# Patient Record
Sex: Male | Born: 1945 | Race: White | Hispanic: No | Marital: Married | Smoking: Never smoker
Health system: Southern US, Community
[De-identification: ages and names within clinical notes are randomized; demographics above are authoritative.]

## PROBLEM LIST (undated history)

## (undated) DIAGNOSIS — E7800 Pure hypercholesterolemia, unspecified: Secondary | ICD-10-CM

## (undated) DIAGNOSIS — F514 Sleep terrors [night terrors]: Secondary | ICD-10-CM

## (undated) DIAGNOSIS — N189 Chronic kidney disease, unspecified: Secondary | ICD-10-CM

## (undated) DIAGNOSIS — G9340 Encephalopathy, unspecified: Secondary | ICD-10-CM

## (undated) DIAGNOSIS — I2510 Atherosclerotic heart disease of native coronary artery without angina pectoris: Secondary | ICD-10-CM

## (undated) DIAGNOSIS — K219 Gastro-esophageal reflux disease without esophagitis: Secondary | ICD-10-CM

## (undated) DIAGNOSIS — I10 Essential (primary) hypertension: Secondary | ICD-10-CM

## (undated) DIAGNOSIS — R519 Headache, unspecified: Secondary | ICD-10-CM

## (undated) DIAGNOSIS — M1990 Unspecified osteoarthritis, unspecified site: Secondary | ICD-10-CM

## (undated) DIAGNOSIS — G629 Polyneuropathy, unspecified: Secondary | ICD-10-CM

## (undated) DIAGNOSIS — I700 Atherosclerosis of aorta: Secondary | ICD-10-CM

## (undated) DIAGNOSIS — F329 Major depressive disorder, single episode, unspecified: Secondary | ICD-10-CM

## (undated) DIAGNOSIS — G4730 Sleep apnea, unspecified: Secondary | ICD-10-CM

## (undated) DIAGNOSIS — D649 Anemia, unspecified: Secondary | ICD-10-CM

## (undated) DIAGNOSIS — E119 Type 2 diabetes mellitus without complications: Secondary | ICD-10-CM

## (undated) DIAGNOSIS — R4182 Altered mental status, unspecified: Secondary | ICD-10-CM

## (undated) DIAGNOSIS — F419 Anxiety disorder, unspecified: Secondary | ICD-10-CM

## (undated) DIAGNOSIS — F32A Depression, unspecified: Secondary | ICD-10-CM

## (undated) HISTORY — PX: KNEE ARTHROSCOPY: SHX127

## (undated) HISTORY — PX: ESOPHAGOGASTRODUODENOSCOPY: SHX1529

## (undated) HISTORY — PX: COLONOSCOPY: SHX174

## (undated) HISTORY — PX: JOINT REPLACEMENT: SHX530

## (undated) HISTORY — PX: TONSILLECTOMY: SUR1361

## (undated) HISTORY — PX: CIRCUMCISION: SUR203

## (undated) HISTORY — PX: APPENDECTOMY: SHX54

---

## 2005-01-25 ENCOUNTER — Ambulatory Visit: Payer: Self-pay | Admitting: Internal Medicine

## 2005-04-23 ENCOUNTER — Other Ambulatory Visit: Payer: Self-pay

## 2005-04-23 ENCOUNTER — Emergency Department: Payer: Self-pay | Admitting: Emergency Medicine

## 2005-06-16 ENCOUNTER — Ambulatory Visit: Payer: Self-pay | Admitting: Unknown Physician Specialty

## 2005-10-31 ENCOUNTER — Other Ambulatory Visit: Payer: Self-pay

## 2005-10-31 ENCOUNTER — Emergency Department: Payer: Self-pay | Admitting: Unknown Physician Specialty

## 2008-04-29 ENCOUNTER — Ambulatory Visit: Payer: Self-pay | Admitting: Gastroenterology

## 2008-12-31 ENCOUNTER — Ambulatory Visit: Payer: Self-pay | Admitting: Internal Medicine

## 2009-01-10 ENCOUNTER — Ambulatory Visit: Payer: Self-pay | Admitting: Internal Medicine

## 2009-09-01 ENCOUNTER — Emergency Department: Payer: Self-pay | Admitting: Emergency Medicine

## 2009-09-03 ENCOUNTER — Emergency Department: Payer: Self-pay | Admitting: Emergency Medicine

## 2011-07-31 ENCOUNTER — Emergency Department: Payer: Self-pay | Admitting: Unknown Physician Specialty

## 2012-01-14 ENCOUNTER — Emergency Department: Payer: Self-pay | Admitting: Internal Medicine

## 2012-01-14 LAB — DRUG SCREEN, URINE
Amphetamines, Ur Screen: NEGATIVE (ref ?–1000)
Barbiturates, Ur Screen: NEGATIVE (ref ?–200)
Benzodiazepine, Ur Scrn: NEGATIVE (ref ?–200)
Methadone, Ur Screen: NEGATIVE (ref ?–300)
Opiate, Ur Screen: NEGATIVE (ref ?–300)
Phencyclidine (PCP) Ur S: NEGATIVE (ref ?–25)
Tricyclic, Ur Screen: NEGATIVE (ref ?–1000)

## 2012-01-14 LAB — COMPREHENSIVE METABOLIC PANEL
Anion Gap: 8 (ref 7–16)
BUN: 29 mg/dL — ABNORMAL HIGH (ref 7–18)
Bilirubin,Total: 0.2 mg/dL (ref 0.2–1.0)
Chloride: 109 mmol/L — ABNORMAL HIGH (ref 98–107)
EGFR (Non-African Amer.): >60
Potassium: 3.9 mmol/L (ref 3.5–5.1)

## 2012-01-14 LAB — ETHANOL: Ethanol %: <0.003 % (ref 0.000–0.080)

## 2012-01-14 LAB — CBC
HCT: 40.4 % (ref 40.0–52.0)
HGB: 13.5 g/dL (ref 13.0–18.0)
Platelet: 211 10*3/uL (ref 150–440)
RBC: 4.35 10*6/uL — ABNORMAL LOW (ref 4.40–5.90)
RDW: 13.6 % (ref 11.5–14.5)

## 2012-01-14 LAB — LIPASE, BLOOD: Lipase: 74 U/L (ref 73–393)

## 2012-05-29 ENCOUNTER — Emergency Department (HOSPITAL_COMMUNITY): Payer: Medicare Other

## 2012-05-29 ENCOUNTER — Emergency Department (HOSPITAL_COMMUNITY)
Admission: EM | Admit: 2012-05-29 | Discharge: 2012-05-29 | Disposition: A | Discharge status: Home / Self Care | Payer: Medicare Other | Attending: Emergency Medicine | Admitting: Emergency Medicine

## 2012-05-29 DIAGNOSIS — F29 Unspecified psychosis not due to a substance or known physiological condition: Secondary | ICD-10-CM | POA: Insufficient documentation

## 2012-05-29 DIAGNOSIS — G319 Degenerative disease of nervous system, unspecified: Secondary | ICD-10-CM | POA: Insufficient documentation

## 2012-05-29 DIAGNOSIS — R4182 Altered mental status, unspecified: Secondary | ICD-10-CM | POA: Insufficient documentation

## 2012-05-29 DIAGNOSIS — G459 Transient cerebral ischemic attack, unspecified: Secondary | ICD-10-CM

## 2012-05-29 LAB — POCT I-STAT, CHEM 8
BUN: 29 mg/dL — ABNORMAL HIGH (ref 6–23)
Chloride: 110 mEq/L (ref 96–112)
HCT: 44 % (ref 39.0–52.0)
Potassium: 4.3 mEq/L (ref 3.5–5.1)
Sodium: 141 mEq/L (ref 135–145)

## 2012-05-29 LAB — COMPREHENSIVE METABOLIC PANEL
Albumin: 3.6 g/dL (ref 3.5–5.2)
Alkaline Phosphatase: 82 U/L (ref 39–117)
BUN: 28 mg/dL — ABNORMAL HIGH (ref 6–23)
Calcium: 9.1 mg/dL (ref 8.4–10.5)
Creatinine, Ser: 1.17 mg/dL (ref 0.50–1.35)
Potassium: 4.3 mEq/L (ref 3.5–5.1)
Total Protein: 6.8 g/dL (ref 6.0–8.3)

## 2012-05-29 LAB — DIFFERENTIAL
Basophils Relative: 0 % (ref 0–1)
Eosinophils Absolute: 0.2 10*3/uL (ref 0.0–0.7)
Eosinophils Relative: 2 % (ref 0–5)
Monocytes Relative: 10 % (ref 3–12)
Neutrophils Relative %: 67 % (ref 43–77)

## 2012-05-29 LAB — CBC
Hemoglobin: 14.5 g/dL (ref 13.0–17.0)
MCH: 31.2 pg (ref 26.0–34.0)
MCHC: 33.9 g/dL (ref 30.0–36.0)

## 2012-05-29 LAB — PROTIME-INR
INR: 0.99 (ref 0.00–1.49)
Prothrombin Time: 13 seconds (ref 11.6–15.2)

## 2012-05-29 LAB — POCT I-STAT TROPONIN I: Troponin i, poc: 0.03 ng/mL (ref 0.00–0.08)

## 2012-05-29 LAB — TROPONIN I: Troponin I: <0.3 ng/mL (ref <0.30)

## 2012-05-29 LAB — GLUCOSE, CAPILLARY: Glucose-Capillary: 140 mg/dL — ABNORMAL HIGH (ref 70–99)

## 2012-05-29 NOTE — ED Notes (Signed)
Pt ambulated with amanda, nt. Pt did well. Pt states that he was "alittle dizzy" but he felt that was because he had been lying in bed so long.

## 2012-05-29 NOTE — ED Provider Notes (Signed)
History     CSN: [id number]  Arrival date & time 05/29/12  1749   First MD Initiated Contact with Patient 05/29/12 1802      Chief Complaint  Patient presents with  . Code Stroke    (Consider location/radiation/quality/duration/timing/severity/associated sxs/prior treatment) HPI - Level 5 caveat due to urgent need for intervention Pt reports this morning he was at [facility] and didn't feel right. States he was having difficulty speaking. He went to work where a co-worker didn't think he looked well and called EMS while helping him to the floor. On EMS arrival he was responsive but confused. He had ?L sided hand weakness, brought to the ED as Code Stroke.   No past medical history on file.  No past surgical history on file.  No family history on file.  History  Substance Use Topics  . Smoking status: Not on file  . Smokeless tobacco: Not on file  . Alcohol Use: Not on file      Review of Systems Unable to assess due to mental status.   Allergies  Penicillins and Tavist  Home Medications  No current outpatient prescriptions on file.  BP 164/65  Pulse 78  Temp 98.4 F (36.9 C) (Oral)  Resp 25  SpO2 100%  Physical Exam  Nursing note and vitals reviewed. Constitutional: He is oriented to person, place, and time. He appears well-developed and well-nourished.  HENT:  Head: Normocephalic and atraumatic.  Eyes: EOM are normal. Pupils are equal, round, and reactive to light.  Neck: Normal range of motion. Neck supple.  Cardiovascular: Normal rate, normal heart sounds and intact distal pulses.   Pulmonary/Chest: Effort normal and breath sounds normal.  Abdominal: Bowel sounds are normal. He exhibits no distension. There is no tenderness.  Musculoskeletal: Normal range of motion. He exhibits no edema and no tenderness.  Neurological: He is alert and oriented to person, place, and time. He has normal strength. No cranial nerve deficit or sensory deficit.       For full  NIHSS, please see Code Stroke Team notes  Skin: Skin is warm and dry. No rash noted.  Psychiatric: He has a normal mood and affect.    ED Course  Procedures (including critical care time)  Labs Reviewed  COMPREHENSIVE METABOLIC PANEL - Abnormal; Notable for the following:    Glucose, Bld 143 (*)     BUN 28 (*)     Total Bilirubin 0.1 (*)     GFR calc non Af Amer 63 (*)     GFR calc Af Amer 73 (*)     All other components within normal limits  POCT I-STAT, CHEM 8 - Abnormal; Notable for the following:    BUN 29 (*)     Glucose, Bld 137 (*)     All other components within normal limits  GLUCOSE, CAPILLARY - Abnormal; Notable for the following:    Glucose-Capillary 140 (*)     All other components within normal limits  PROTIME-INR  APTT  CBC  DIFFERENTIAL  TROPONIN I  POCT I-STAT TROPONIN I   Ct Head Wo Contrast  05/29/2012  *RADIOLOGY REPORT*  Clinical Data:  Last seen normal 1730 hours.  Left-sided weakness and confusion.  CT HEAD WITHOUT CONTRAST  Technique:  Contiguous axial images were obtained from the base of the skull through the vertex without contrast  Comparison:  None.  Findings:  The brain has a normal appearance without evidence for hemorrhage, acute infarction, hydrocephalus, or mass lesion.  There is no extra axial fluid collection.  The skull and paranasal sinuses are normal.  There are no CT signs of proximal vascular thrombosis, but there is a tiny calcification seen in the region of the M1 segment right middle cerebral artery which could represent intracranial atherosclerotic change.  Correlate clinically.  IMPRESSION:  No acute or focal intracranial abnormality.  Probable small focus of calcification proximal right middle cerebral artery.  Critical Value/emergent results were called by telephone at the time of interpretation on 05/30/2012 at 6:15 hours to Dr. Thad Ranger, who verbally acknowledged these results.   Original Report Authenticated By: Elsie Stain, M.D.     Mr [facility] Wo Contrast  05/29/2012  *RADIOLOGY REPORT*  Clinical Data:  Generalized weakness. Code stroke.  MRI HEAD WITHOUT CONTRAST MRA HEAD WITHOUT CONTRAST  Technique:  Multiplanar, multiecho pulse sequences of the brain and surrounding structures were obtained without intravenous contrast. Angiographic images of the head were obtained using MRA technique without contrast.  Comparison:  CT head earlier in the day.  MRI HEAD  Findings:  There is no evidence for acute infarction, intracranial hemorrhage, mass lesion, hydrocephalus, or extra-axial fluid.  Mild age related atrophy.  No significant white matter disease.  No foci of chronic hemorrhage.  Normal pituitary and cerebellar tonsils. No acute sinus disease. Nonspecific left mastoid fluid.  IMPRESSION: Mild age related atrophy.  No acute intracranial findings. No change from prior unremarkable CT.  MRA HEAD  Findings: The internal carotid arteries are widely patent.  The basilar artery is widely patent with vertebrals codominant.  There is no proximal stenosis of the anterior, middle, or posterior cerebral arteries. There is no visible intracranial aneurysm.  The area questioned as a possible calcification CT in the proximal right MCA is not associated with any narrowing.  IMPRESSION: Negative exam.   Original Report Authenticated By: Elsie Stain, M.D.    Mr Brain Wo Contrast  05/29/2012  *RADIOLOGY REPORT*  Clinical Data:  Generalized weakness. Code stroke.  MRI HEAD WITHOUT CONTRAST MRA HEAD WITHOUT CONTRAST  Technique:  Multiplanar, multiecho pulse sequences of the brain and surrounding structures were obtained without intravenous contrast. Angiographic images of the head were obtained using MRA technique without contrast.  Comparison:  CT head earlier in the day.  MRI HEAD  Findings:  There is no evidence for acute infarction, intracranial hemorrhage, mass lesion, hydrocephalus, or extra-axial fluid.  Mild age related atrophy.  No significant  white matter disease.  No foci of chronic hemorrhage.  Normal pituitary and cerebellar tonsils. No acute sinus disease. Nonspecific left mastoid fluid.  IMPRESSION: Mild age related atrophy.  No acute intracranial findings. No change from prior unremarkable CT.  MRA HEAD  Findings: The internal carotid arteries are widely patent.  The basilar artery is widely patent with vertebrals codominant.  There is no proximal stenosis of the anterior, middle, or posterior cerebral arteries. There is no visible intracranial aneurysm.  The area questioned as a possible calcification CT in the proximal right MCA is not associated with any narrowing.  IMPRESSION: Negative exam.   Original Report Authenticated By: Elsie Stain, M.D.      1. Altered mental status       MDM   Date: 05/29/2012  Rate: 73  Rhythm: normal sinus rhythm  QRS Axis: normal  Intervals: normal  ST/T Wave abnormalities: normal  Conduction Disutrbances: none  Narrative Interpretation: unremarkable  Pt evaluated by Code Stroke team who did not feel he is a tPA  candidate. His symptoms are completely resolved now and he is back to baseline. His symptoms are not consistent with ischemic stroke. MRI/MRA ordered by Dr. Thad Ranger are neg. Per her recommendations no further ischemic workup is needed. Pt states he has an appointment with his PCP           Teena Mangus B. Bernette Mayers, MD 05/29/12 2318

## 2012-05-29 NOTE — Consult Note (Signed)
Referring Physician: Dr. Freida Busman    Chief Complaint: problems talking  HPI: Patrick Turner is an 67 y.o. male who states that he was in [place] today around 10am and began to "feel funny". He could not elaborate. 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 The patient says that he was unable to speak. He was concerned that he was confused, although EMS stated that he was oriented.   He has no history of sz activity. He has had panic attacks in the past with difficulty speaking he admits. He is here alone. Wife not yet present. He is hard of hearing. Glucose 137.  NIHSS=1 on presentation, resolved to NIHSS=0   LSN: 1000 per patient. tPA Given: No: symptoms resolved quickly. Out of window for tPA.  PMH: Hypertension, Depression, Anxiety  No family history on file.  Social History:  does not have a smoking history on file. He does not have any smokeless tobacco history on file. His alcohol and drug histories not on file.  Allergies: Allergies not on file  Medications-  ROS: History obtained from the patient.  General ROS: negative for - chills, fatigue, fever, night sweats, weight gain or weight loss Psychological ROS: negative for - behavioral disorder, hallucinations, memory difficulties, mood swings or suicidal ideation, +depression, anxiety Ophthalmic ROS: negative for - blurry vision, double vision, eye pain or loss of vision, +wears glasses ENT ROS: negative for - epistaxis, nasal discharge, oral lesions, sore throat, tinnitus or vertigo Allergy and Immunology ROS: negative for - hives or itchy/watery eyes Hematological and Lymphatic ROS: negative for - bleeding problems, bruising or swollen lymph nodes Endocrine ROS: negative for - galactorrhea, hair pattern changes, polydipsia/polyuria or temperature intolerance Respiratory ROS: negative for - cough, hemoptysis, shortness of breath or wheezing Cardiovascular ROS: negative for -  chest pain, dyspnea on exertion, edema or irregular heartbeat Gastrointestinal ROS: negative for - abdominal pain, diarrhea, hematemesis, nausea/vomiting or stool incontinence Genito-Urinary ROS: negative for - dysuria, hematuria, incontinence or urinary frequency/urgency Musculoskeletal ROS: negative for - joint swelling or muscular weakness Neurological ROS: as noted in HPI Dermatological ROS: negative for rash and skin lesion changes    Physical Examination: There were no vitals taken for this visit.  Neurologic Examination: Mental Status: Alert, oriented, thought content appropriate.  Difficulty word finding that improves with continued speech  Able to follow 3 step commands without difficulty. Cranial Nerves: II: visual fields grossly normal, pupils equal, round, reactive to light and accommodation III,IV, VI: ptosis not present, extra-ocular motions intact bilaterally V,VII: smile symmetric, facial light touch sensation normal bilaterally VIII: hearing normal bilaterally IX,X: gag reflex present XI: trapezius strength/neck flexion strength normal bilaterally XII: tongue strength normal  Motor: Right : Upper extremity   5/5    Left:     Upper extremity   5/5  Lower extremity   5/5     Lower extremity   5/5 Tone and bulk:normal tone throughout; no atrophy noted Sensory: Pinprick and light touch intact throughout, bilaterally Deep Tendon Reflexes: 2+ and symmetric throughout Plantars: Right: downgoing   Left: downgoing Cerebellar: normal heel-to-shin test, unable to access gait.   Results for orders placed during the hospital encounter of 05/29/12 (from the past 48 hour(s))  POCT I-STAT, CHEM 8     Status: Abnormal   Collection Time   05/29/12  6:13 PM      Component Value Range Comment   Sodium 141  135 -  145 mEq/L    Potassium 4.3  3.5 - 5.1 mEq/L    Chloride 110  96 - 112 mEq/L    BUN 29 (*) 6 - 23 mg/dL    Creatinine, Ser 1.61  0.50 - 1.35 mg/dL    Glucose, Bld 096 (*)  70 - 99 mg/dL    Calcium, Ion 0.45  4.09 - 1.30 mmol/L    TCO2 21  0 - 100 mmol/L    Hemoglobin 15.0  13.0 - 17.0 g/dL    HCT 81.1  91.4 - 78.2 %   GLUCOSE, CAPILLARY     Status: Abnormal   Collection Time   05/29/12  6:19 PM      Component Value Range Comment   Glucose-Capillary 140 (*) 70 - 99 mg/dL    Ct Head Wo Contrast 05/29/2012    No acute or focal intracranial abnormality.  Probable small focus of calcification proximal right middle cerebral artery.  Critical Value/emergent results were called by telephone at the time of interpretation on 05/30/2012 at 6:15 hours to Dr. Thad Ranger, who verbally acknowledged these results.   Original Report Authenticated By: Elsie Stain, M.D.     Assessment: 66 y.o. male with generalized weakness and "feeling bad" since 10am. Expressive aphasia on presentation which has now cleared. Further work up necessary to determine if this was even an ischemic event.  Patient reports just feeling poorly-as if about to pass out-unlikely stroke.    -Speech difficulty, resolved, NIHSS=1, now at NIHSS=0 -Hypertension -Depression -Anxiety -Obesity -Diabetes Mellitus  Stroke Risk Factors - diabetes mellitus and hypertension  Plan: 1. MRI, MRA  of the brain without contrast with remaining stroke work up only to be performed if imaging diagnostic for an acute ischemic event.   2. Prophylactic therapy-Antiplatelet med: Aspirin - dose 81mg  daily, rec: start if not already on aspirin.  3. Risk factor modification 4. Telemetry monitoring 5. Frequent neuro checks   Job Founds, MBA, [place] [facility] Pager [phone]  05/29/2012, 6:25 PM  Patient seen and examined.  Clinical course and management discussed.  Necessary edits performed.  I agree with the above.  Thana Farr, MD [facility] [phone]  05/29/2012  7:08 PM

## 2012-05-29 NOTE — ED Notes (Signed)
Per EMS- pt has not felt good since 10am. At approx 1725 pt was noted by coworker to "look like he felt good". Pt was assisted to floor by coworker. Pt states that he could not speak. Pt was responsive but sluggish. Pt was weak bilaterally upon EMS arrival. Upon arrival to ED pt was able to grip with some weakness in left hand. CBG 137. BP 138 systolic. 98% on room air. Hr 78 NSR.   Code stroke log-  Encoded 1738 Called 1739 Pt arrival 1750 EDP exam 1751 strok team arrival 1750 Last seen normal 1000 Pt arrival in CT 1755  Phlebotomist 1750 CT read 1803 Code stroke canceled at 1819 by Dr. Thad Ranger.

## 2012-05-29 NOTE — ED Notes (Signed)
CBG: 140. RN notified. 

## 2012-11-22 ENCOUNTER — Ambulatory Visit: Payer: Self-pay | Admitting: Unknown Physician Specialty

## 2012-11-23 ENCOUNTER — Observation Stay: Payer: Self-pay | Admitting: Internal Medicine

## 2012-11-23 LAB — URINALYSIS, COMPLETE
Bilirubin,UR: NEGATIVE
Blood: NEGATIVE
Ketone: NEGATIVE
Leukocyte Esterase: NEGATIVE
Nitrite: NEGATIVE
RBC,UR: <1 /HPF (ref 0–5)
WBC UR: <1 /HPF (ref 0–5)

## 2012-11-23 LAB — CBC WITH DIFFERENTIAL/PLATELET
Eosinophil #: 0.2 10*3/uL (ref 0.0–0.7)
Eosinophil %: 2 %
HCT: 40.9 % (ref 40.0–52.0)
Lymphocyte %: 12 %
Neutrophil #: 7.4 10*3/uL — ABNORMAL HIGH (ref 1.4–6.5)
Platelet: 227 10*3/uL (ref 150–440)
RDW: 13.8 % (ref 11.5–14.5)

## 2012-11-23 LAB — COMPREHENSIVE METABOLIC PANEL
Alkaline Phosphatase: 71 U/L (ref 50–136)
BUN: 26 mg/dL — ABNORMAL HIGH (ref 7–18)
Bilirubin,Total: 0.2 mg/dL (ref 0.2–1.0)
Co2: 22 mmol/L (ref 21–32)
Creatinine: 1.5 mg/dL — ABNORMAL HIGH (ref 0.60–1.30)
EGFR (African American): 55 — ABNORMAL LOW
EGFR (Non-African Amer.): 47 — ABNORMAL LOW
Glucose: 70 mg/dL (ref 65–99)
Osmolality: 283 (ref 275–301)
SGOT(AST): 38 U/L — ABNORMAL HIGH (ref 15–37)
Sodium: 140 mmol/L (ref 136–145)
Total Protein: 6.6 g/dL (ref 6.4–8.2)

## 2012-11-23 LAB — HEMOGLOBIN A1C: Hemoglobin A1C: 6.8 % — ABNORMAL HIGH (ref 4.2–6.3)

## 2012-11-24 LAB — BASIC METABOLIC PANEL
EGFR (African American): >60
Glucose: 180 mg/dL — ABNORMAL HIGH (ref 65–99)
Osmolality: 285 (ref 275–301)
Sodium: 139 mmol/L (ref 136–145)

## 2012-11-24 LAB — CBC WITH DIFFERENTIAL/PLATELET
HGB: 14 g/dL (ref 13.0–18.0)
Lymphocyte %: 25.3 %
MCHC: 33 g/dL (ref 32.0–36.0)
Monocyte #: 0.8 x10 3/mm (ref 0.2–1.0)
Monocyte %: 10.4 %

## 2014-01-21 DIAGNOSIS — E1122 Type 2 diabetes mellitus with diabetic chronic kidney disease: Secondary | ICD-10-CM

## 2014-01-21 DIAGNOSIS — G4733 Obstructive sleep apnea (adult) (pediatric): Secondary | ICD-10-CM | POA: Insufficient documentation

## 2014-01-21 DIAGNOSIS — I10 Essential (primary) hypertension: Secondary | ICD-10-CM | POA: Diagnosis present

## 2014-01-21 DIAGNOSIS — N183 Chronic kidney disease, stage 3 unspecified: Secondary | ICD-10-CM

## 2014-01-24 DIAGNOSIS — G2119 Other drug induced secondary parkinsonism: Secondary | ICD-10-CM | POA: Insufficient documentation

## 2014-02-07 ENCOUNTER — Ambulatory Visit: Payer: Self-pay | Admitting: Internal Medicine

## 2014-02-19 ENCOUNTER — Ambulatory Visit: Payer: Self-pay

## 2014-02-25 ENCOUNTER — Ambulatory Visit: Payer: Self-pay | Admitting: Internal Medicine

## 2014-03-08 DIAGNOSIS — K219 Gastro-esophageal reflux disease without esophagitis: Secondary | ICD-10-CM | POA: Diagnosis present

## 2014-03-08 DIAGNOSIS — E785 Hyperlipidemia, unspecified: Secondary | ICD-10-CM | POA: Insufficient documentation

## 2014-03-08 DIAGNOSIS — E1169 Type 2 diabetes mellitus with other specified complication: Secondary | ICD-10-CM | POA: Insufficient documentation

## 2014-08-02 DIAGNOSIS — F325 Major depressive disorder, single episode, in full remission: Secondary | ICD-10-CM | POA: Insufficient documentation

## 2014-09-16 DIAGNOSIS — D510 Vitamin B12 deficiency anemia due to intrinsic factor deficiency: Secondary | ICD-10-CM | POA: Diagnosis not present

## 2014-09-16 DIAGNOSIS — E1140 Type 2 diabetes mellitus with diabetic neuropathy, unspecified: Secondary | ICD-10-CM | POA: Diagnosis not present

## 2014-09-23 DIAGNOSIS — Z794 Long term (current) use of insulin: Secondary | ICD-10-CM | POA: Diagnosis not present

## 2014-09-23 DIAGNOSIS — E1140 Type 2 diabetes mellitus with diabetic neuropathy, unspecified: Secondary | ICD-10-CM | POA: Diagnosis not present

## 2014-09-23 DIAGNOSIS — G629 Polyneuropathy, unspecified: Secondary | ICD-10-CM | POA: Diagnosis not present

## 2014-12-10 DIAGNOSIS — E1122 Type 2 diabetes mellitus with diabetic chronic kidney disease: Secondary | ICD-10-CM | POA: Diagnosis not present

## 2014-12-10 DIAGNOSIS — F3341 Major depressive disorder, recurrent, in partial remission: Secondary | ICD-10-CM | POA: Diagnosis not present

## 2014-12-10 DIAGNOSIS — E1169 Type 2 diabetes mellitus with other specified complication: Secondary | ICD-10-CM | POA: Diagnosis not present

## 2014-12-10 DIAGNOSIS — I10 Essential (primary) hypertension: Secondary | ICD-10-CM | POA: Diagnosis not present

## 2014-12-17 DIAGNOSIS — F333 Major depressive disorder, recurrent, severe with psychotic symptoms: Secondary | ICD-10-CM | POA: Diagnosis not present

## 2014-12-24 DIAGNOSIS — D510 Vitamin B12 deficiency anemia due to intrinsic factor deficiency: Secondary | ICD-10-CM | POA: Diagnosis not present

## 2014-12-24 DIAGNOSIS — E1169 Type 2 diabetes mellitus with other specified complication: Secondary | ICD-10-CM | POA: Diagnosis not present

## 2014-12-24 DIAGNOSIS — E1149 Type 2 diabetes mellitus with other diabetic neurological complication: Secondary | ICD-10-CM | POA: Diagnosis not present

## 2014-12-24 DIAGNOSIS — E785 Hyperlipidemia, unspecified: Secondary | ICD-10-CM | POA: Diagnosis not present

## 2014-12-31 DIAGNOSIS — Z794 Long term (current) use of insulin: Secondary | ICD-10-CM | POA: Diagnosis not present

## 2014-12-31 DIAGNOSIS — E1149 Type 2 diabetes mellitus with other diabetic neurological complication: Secondary | ICD-10-CM | POA: Diagnosis not present

## 2015-01-02 NOTE — Consult Note (Signed)
Brief Consult Note: Diagnosis: Major depressive disorder.   Patient was seen by consultant.   Comments: Mr. Phung has been a patient of Dr. Lanetta Inch. He is in good hands. His paxil was recently switched to am in the context of nightmares. His terrors most likely stem from hypoglycemia.   PLAN: 1. Please continue medications as prescribed by Dr. Thurmond Butts.  2. Follow up with Dr. Thurmond Butts.  Electronic Signatures: Orson Slick (MD)  (Signed 14-Mar-14 18:20)  Authored: Brief Consult Note   Last Updated: 14-Mar-14 18:20 by Orson Slick (MD)

## 2015-01-02 NOTE — Consult Note (Signed)
PATIENT NAME:  Patrick Turner, CALLEROS MR#:  [id number] DATE OF BIRTH:  1945-11-23  DATE OF CONSULTATION:  11/23/2012  REFERRING PHYSICIAN:  Frazier Richards, M.D. CONSULTING PHYSICIAN:  A. Lavone Orn, MD  CHIEF COMPLAINT:  Type 2 diabetes with hypoglycemia.   HISTORY OF PRESENT ILLNESS:  This is a 69 year old male who was admitted earlier today with altered mental status and hypoglycemia.  The patient is well-known to me.  He has a history of type 2 diabetes managed with insulin and metformin.  Last night he was at a sleep lab clinic where he apparently developed altered mental status.  EMS was called.  His blood sugar was quite low, perhaps into the 20s.  He was given one amp of D50 and brought to the ED.  In the ED, blood sugar dropped into the 50s.  He was put on IV dextrose.  Mental status normalized.  Both metformin and his insulin have been held.  The patient also reports he had an episode earlier this week of loss of consciousness where EMS was called to his home.  Apparently that occurred around 4:45 p.m. where the episode early this morning occurred around 1:30 a.m.  During the episode earlier this week, he called his psychiatrist and apparently had some garbled speech and they called EMS to the home.  By his report, his glucose was as low as 17.  They treated him and did not take him to the ED for that episode.  The patient reports he checks his blood sugars typically twice daily and that sugars in the last two weeks fasting have ranged 50 to 110 and in the evenings have been in the 90 to 210 range.  He says he has been eating well.  He denies weight loss or weight gain.  He does occasionally skip lunch.  Yesterday, he did not have any missed meals.  He reports good compliance with his insulin regimen and denies missing any doses.   PAST MEDICAL HISTORY: 1.  Type 2 diabetes.  2.  Diabetic peripheral neuropathy.  3.  Hypertension.  4.  Gastroesophageal reflux disease.  5.  Migraine headaches.   6.  Depression.  7.  Hyperlipidemia.  8.  History of CAD status post PTCA.  9.  Obstructive sleep apnea.   PAST SURGICAL HISTORY: 1.  Appendectomy.  2.  Tonsillectomy.   SOCIAL HISTORY:  The patient is married.  No tobacco or alcohol use.  He is employed.   FAMILY HISTORY:  Positive for diabetes and hypertension.   OUTPATIENT REGIMEN:  1.  Humulin 70/30 mix, 40 units with breakfast and 45 units with supper.  2.  Metformin 500 mg 4 tabs daily.  3.  Gabapentin 800 mg 2 tabs daily.  4.  Lorazepam 0.5 mg 3 times daily as needed.  5.  Ziprasidone 20 mg at bedtime.  6.  Omeprazole 40 mg daily.  7.  Pravastatin 80 mg daily.  8.  Paxil 40 mg daily.  9.  Topiramate 100 mg twice daily.  10.  Lisinopril to HCTZ 20/12.5 2 tabs daily.  11.  Aspirin 325 mg daily.  12.  Ambien 10 mg at bedtime as needed.   ALLERGIES:  TAVIST AND PENICILLIN.   REVIEW OF SYSTEMS:  HEENT:  Denies blurred vision.  No sore throat.  NECK:  No neck pain.  No dysphasia.  CARDIAC:  No chest pain.  No palpitations.  PULMONARY:  No cough.  No shortness of breath.  ABDOMEN:  Good appetite.  No  recent weight loss.  No abdominal pain.  EXTREMITIES:  Denies leg swelling.  SKIN:  Denies rash or pruritus.  ENDOCRINE:  Denies heat or cold intolerance.  NEUROLOGIC:  No recent falls.  Denies tremor.   PHYSICAL EXAMINATION: VITAL SIGNS:  Height 65 inches, weight 264 pounds, BMI 43.9, temperature 98.7, pulse 75, respirations 18, blood pressure 162/72, pulse ox 97% on room air.  GENERAL:  Obese white male in no acute distress.  HEENT:  Extraocular movements are intact.  Oropharynx is clear.  Mucous membranes moist.  NECK:  Supple.  No thyromegaly.  CARDIAC:  Regular rate and rhythm.  No murmur.  No carotid bruit.  LUNGS:  Clear to auscultation bilaterally.  No rales, wheeze or rhonchi.  ABDOMEN:  Diffusely soft, nontender, nondistended.  EXTREMITIES:  No edema is present.  Normal motor tone.  NEUROLOGIC:  No tremor of  outstretched hands.  SKIN:  No rash or dermatopathy.   LABORATORY DATA:  Blood sugars since 7:00 a.m. have ranged 193 to 200, 2:30 a.m. venous blood draw, glucose 70, BUN 26, creatinine 1.5, chloride 110, eGFR 55, calcium 8.1.  Hemoglobin A1c 6.8%.  Albumin 3.2, AST 38, ALT 25, hematocrit 40.9%, WBC 9.4.   ASSESSMENT:  A 69 year old male with type 2 diabetes, complicated by neuropathy, and recurrent hypoglycemia.   RECOMMENDATIONS: 1.  Blood sugars are now quite fairly elevated, we will stop his IV dextrose.  2.  Continue to hold scheduled insulin as you are doing.  3.  Continue NovoLog insulin sliding scale.  4.  If blood sugars remain above 180 tomorrow, would restart his metformin.  5.  If blood sugars are not above 300, it would be reasonable for him to go on home on a regimen of metformin alone.  Tentatively, I expect we will continue to hold insulin at the time of discharge.  I have an appointment to see the patient in clinic within two weeks.  We will have him monitor his blood sugars frequently after discharge, review sugars at the time of the clinic appointment, and then consider adjusting medications further if needed.   Thank you for the kind request for consultation.     ____________________________ A. Lavone Orn, MD ams:ea D: 11/23/2012 17:20:29 ET T: 11/24/2012 04:27:03 ET JOB#: [id number]  cc: A. Lavone Orn, MD, <Dictator> Sherlon Handing MD ELECTRONICALLY SIGNED 11/26/2012 16:48

## 2015-01-02 NOTE — Consult Note (Signed)
Chief Complaint and History:  Referring Physician Frazier Richards, MD   Chief Complaint Hypoglycemia   Allergies:  Tavist-D: Other  Penicillin: Unknown  Assessment/Plan:  Assessment/Plan 69 yo M well known to me with type 2 diabetes -admitted today with AMS and hypoglycemia. Was placed on IV dextrose. Sugars now in 190s.  A/ Type 1 diabetes Hypoglycemia  P/ 1. Agree with holding the scheduled insulin 70/30 Mix as you are doing 2. DC IV Dextrose. 3. Continue NovoLog SSI 4. If sugars are >180 tomorrow, add back metformin 5. Anticipate if sugars are fairly stable and Dr. Ouida Sills discharges him tomorrow, for him to go home on metformin alone. I can then see him back in clinic in 2 weeks and adjust his diabetes regimen further, if needed.  A full consult will be dictated.   Electronic Signatures: Judi Cong (MD)  (Signed 14-Mar-14 16:26)  Authored: Chief Complaint and History, ALLERGIES, Assessment/Plan   Last Updated: 14-Mar-14 16:26 by Judi Cong (MD)

## 2015-01-02 NOTE — Discharge Summary (Signed)
PATIENT NAME:  Patrick Turner, Patrick Turner MR#:  [id number] DATE OF BIRTH:  10/08/1945  DATE OF ADMISSION:  11/23/2012 DATE OF DISCHARGE:  11/25/2012  DISCHARGE DIAGNOSES: 1. Altered mental status with encephalopathy.  2. 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  3. Type 2 diabetes.  4. Obstructive sleep apnea.  5. Hypertension.  6. History of coronary artery disease.  7. History of anxiety and depression.   CHIEF COMPLAINT: Altered mental status with confusion and delirium.   HISTORY OF PRESENT ILLNESS: The patient is a 69 year old male with a history of type 2 diabetes, hypertension, CAD, who was undergoing a sleep study at the sleep lab when he became confused and fell to the ground; and EMS was called, and he was noted to have low sugar readings of 63, 75, 29 and 40, respectively. The patient improved after he was given 1 ampule of D50, and his blood sugar came up to 119.  The patient reportedly has been having nightmares for the last 10 nights or so with sleep disturbance and would jump up in his bed in the middle of night screaming, and would sit on the floor, and was confused and also had been damaging property in his house.   PAST MEDICAL HISTORY:  Significant for type 2 diabetes, diabetic peripheral neuropathy, hypertension, migraine headaches, depression, hypercholesterolemia, CAD status post PTCA, obstructive sleep apnea syndrome and GERD.   PHYSICAL EXAMINATION: VITAL SIGNS: Blood pressure was 150/70, respirations 18, pulse was 78, temperature was 97.4, O2 saturation 96%.  GENERAL: He was not in distress. HEENT: NCAT.  NECK: Supple.  HEART: S1, S2.  LUNGS: Clear.  ABDOMEN: Soft, nontender.  EXTREMITIES: No edema.  NEUROLOGICAL: Nonfocal. The patient's affect was somewhat flat, but he was alert and oriented x 3.   HOSPITAL COURSE: The patient was admitted to [facility], and he was also seen in consultation by Dr. Gabriel Carina, endocrinologist, who recommended that  in view of hypoglycemia to hold his insulin 70/30 mix at this point; and if sugars are stable, he could be switched to metformin.  During his stay in the hospital, the patient was also started on Klonopin 0.5 mg p.o. 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 He did have a good night's rest and appeared to be stable the following day for discharge. The patient's sugar was around 150, and symptomatically better, and he was discharged on the following medications.   DISCHARGE MEDICATIONS: Aspirin 325 mg once a day, pravastatin 80 mg once a day, Lisinopril/HCT 20/12.5, 1 tablet b.i.d., Paxil 40 mg a day, omeprazole 40 mg once a day, clonazepam 0.5 mg at bedtime, gabapentin 800 mg t.i.d., Topamax 100 mg b.i.d., metformin 500 mg b.i.d.   DISCHARGE INSTRUCTIONS: The patient has been advised to follow a strict low-carb diet and follow up with Dr. Ouida Sills in 1 to 2 weeks' time. He was also advised to keep his followup with his psychiatrist, Dr. Thurmond Butts, in 1 to 2 weeks' time.   Total time spent in discharge of this patient: 35 minutes  ____________________________ Tracie Harrier, MD vh:cb D: 11/25/2012 13:55:49 ET T: 11/25/2012 21:58:43 ET JOB#: [id number]  cc: Tracie Harrier, MD, <Dictator> Tracie Harrier MD ELECTRONICALLY SIGNED 12/23/2012 13:36

## 2015-01-02 NOTE — H&P (Signed)
PATIENT NAME:  Patrick Turner, Patrick Turner MR#:  [id number] DATE OF BIRTH:  May 17, 1946  DATE OF ADMISSION:  11/23/2012  PRIMARY CARE PHYSICIAN: Ocie Cornfield. Ouida Sills, MD  REFERRING PHYSICIAN: Pollie Friar, MD   CHIEF COMPLAINT: Delirium.  HISTORY OF PRESENT ILLNESS: The patient is a 69 year old Caucasian male with history of type 2 diabetes mellitus, on insulin, hypertension and coronary artery disease. The patient, for the last 10 nights, is having nightmares, what is called sleep and night terrors. He will jump from his bed at night, screaming, then he will sit on the floor confused, and then he bounce in the room, busting stuff around. This episode lasts for about 1 hour, then he will go back to his normal. He gave an example that one night he thought that he was inside an airplane cockpit and he was fighting. The patient is currently being  evaluated by a psychiatrist, and he was referred to the sleep lab clinic for a sleep study, and this night, while he was at the sleep lab, he again got confused, he fell to the ground. EMS was called, and upon their arrival, they checked his sugar, and we have the following readings: 63, 75, 29, 40. Then, he was given 1 ampule of dextrose 50, following which his blood sugar was 119. The patient was transferred to the Emergency Department. Here, he had also measurements of blood sugar 119, 70, 51, 122 and 110. Despite normal blood sugar, he did not seem to be right mentally. He was slightly drowsy and confused. I was called to see the patient to see if we can admit him for observation. By the time I am seeing him, he is back to his normal, maybe he is slightly sleepy, but he is appropriate. I am in the process to admit the patient for observation.   REVIEW OF SYSTEMS:  CONSTITUTIONAL: Denies any fever. No chills. No fatigue.  EYES: No blurring of vision. No double vision.  ENT: No hearing impairment. No sore throat. No dysphagia.  CARDIOVASCULAR: No chest pain. No  shortness of breath. No edema.  RESPIRATORY: No shortness of breath. No chest pain. No cough. No hemoptysis.  GASTROINTESTINAL: No abdominal pain, no vomiting, no diarrhea.  GENITOURINARY: No dysuria. No frequency of urination.  MUSCULOSKELETAL: No joint pain or swelling. No muscular pain or swelling.  INTEGUMENTARY: No skin rash. No ulcers. He has a small bruise over the left forehead area from a fall a few days ago.  NEUROLOGY: No focal weakness. No seizure activity reported. No headache.  PSYCHIATRY: He has history of anxiety and depression. Currently, being evaluated for night terror and nightmares.  ENDOCRINE: No polyuria or polydipsia. No heat or cold intolerance.  HEMATOLOGY: No easy bruisability. No lymph node enlargement.    PAST MEDICAL HISTORY:  1. Diabetes mellitus type 2, on insulin.  2. Diabetic peripheral neuropathy.  3. Systemic hypertension.  4. Migraine headaches.  5. Depression.  6. Hypercholesterolemia.  7. Coronary disease, status post PTCA. 8. Also, reported history of obstructive sleep apnea.  9. Gastroesophageal reflux disease   PAST SURGICAL HISTORY: History of coronary artery PTCA.   FAMILY HISTORY: Both parents are deceased. His father died from complications of throat cancer, and he suffered from emphysema. His mother suffered from diabetes mellitus. She has some kind of intestinal problem complications that she died from.   SOCIAL HABITS: Nonsmoker. No history of alcohol or drug abuse.   SOCIAL HISTORY: The patient is married, living with his wife. 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   ADMISSION MEDICATIONS: These include:  1. Insulin 70/30 at 40 units in the morning and 45 units in the evening.  2. Metformin extended-release 500 mg 4 tablets a day.  3. Zolpidem 10 mg as needed for insomnia. 4. Ziprasidone 20 mg at night. 5. Paroxetine 40 mg at night.  6. Omeprazole 40 mg a day.  7. Gabapentin 800 mg 3 times a day. 8.  Topiramate or Topamax 100 mg twice a day.  9. Lisinopril with hydrochlorothiazide 20/12.5 twice a day.  10. Enteric-coated aspirin 325 mg a day.  11. Lorazepam 0.5 mg as needed.  12. Pravastatin 80 mg a day.   ALLERGIES: PENICILLIN AND TAVIST.   PHYSICAL EXAMINATION:  VITAL SIGNS: Blood pressure 151/70, respiratory rate 18, pulse 78, temperature 97.4, oxygen saturation 96%.  GENERAL APPEARANCE: Elderly male lying in bed in no acute distress. He is calm right now.  HEENT: No pallor. No icterus. No cyanosis. Ear examination revealed normal hearing. No ulcers, no discharge. Examination of the nose revealed no ulcers, no discharge, no bleeding. Oropharyngeal examination revealed no oral thrush, no ulcers, no exudate. Eye examination revealed normal eyelids and conjunctivae. Pupils about 5 mm, round, equal and reactive to light.  NECK: Supple. Trachea at midline. No thyromegaly. No cervical lymphadenopathy. No masses.  HEART: Revealed normal S1, S2. No S3, S4. No murmur. No gallop. No carotid bruits.  RESPIRATORY: Revealed normal breathing pattern without use of accessory muscles. No rales. No wheezing.  ABDOMEN: Soft, without tenderness. No hepatosplenomegaly. No masses. No hernias.  SKIN: Revealed no ulcers. No subcutaneous nodules. He has a bruise over the left forehead area from recent fall while sleeping.  MUSCULOSKELETAL: No joint swelling. No clubbing.  NEUROLOGIC: Cranial nerves II through XII are intact. No focal motor deficit.  PSYCHIATRIC: The patient is alert, oriented to place and people, disoriented to time and the day, but then he recognized it is Friday. He knows that this is month of March. The patient is appropriate. Mood and affect were flat.   LABORATORY FINDINGS AND RADIOLOGIC DATA: CAT scan of the head without contrast: This appears to be negative, without any acute intracranial abnormality. Official report is still pending. His EKG showed normal sinus rhythm at rate of 80 per  minute, unremarkable EKG. His blood workup showed blood sugars of the  following readings: 119, 70, 51, 122, 110. BUN 26, creatinine 1.5, sodium 140, potassium 3.5, calcium 8.1, albumin 3.2. CBC showed a white count of 9000, hemoglobin 13, hematocrit 40, platelet count 227.   ASSESSMENT:  1. Acute encephalopathy, appears to be metabolic, either secondary to hypoxemia secondary to obstructive sleep apnea or metabolic secondary to hypoglycemia or other etiology.  2. Sleep terror with nightmares. The patient gets confused and delirious for about an hour or two, then he will be back to normal.  3. Hypoglycemia.  4. Diabetes mellitus, type 2, on insulin.  5. Obstructive sleep apnea syndrome.  6. Diabetic peripheral neuropathy.  7. Systemic hypertension.  8. Coronary artery disease, status post percutaneous transluminal coronary angioplasty.  9. Migraine headaches.  10. Hypercholesterolemia.  11. Anxiety.  12. Depression.   PLAN: Will admit for observation. Follow up on blood sugar. I will hold his insulin 70/30, and also I will hold metformin. Neuro checks q.2 hours. We need to see the results of the sleep study if it was completed. Two potential causes of his encephalopathy, either hypoxemia from sleep apnea or hypoglycemia from his  diabetic medications. Given his confusion at night, I will hold zolpidem and ziprasidone and paroxetine or Paxil. He was given an ampule of dextrose 50% 25 grams in the Emergency Department; however, sugar noted to be also dropping after that. Therefore, I will place him on intravenous dextrose 5% at 80 mL/hour just to prevent hypoglycemia while I am holding the metformin and the insulin.   CODE STATUS: Full code.   TIME SPENT IN EVALUATING THIS PATIENT: Took more than 1 hour.   ____________________________ Clovis Pu. Lenore Manner, MD amd:OSi D: 11/23/2012 05:32:20 ET T: 11/23/2012 05:54:38 ET JOB#: [id number]  cc: Clovis Pu. Lenore Manner, MD, <Dictator> Ellin Saba  MD ELECTRONICALLY SIGNED 11/23/2012 22:45

## 2015-01-14 DIAGNOSIS — E784 Other hyperlipidemia: Secondary | ICD-10-CM | POA: Diagnosis not present

## 2015-01-14 DIAGNOSIS — I10 Essential (primary) hypertension: Secondary | ICD-10-CM | POA: Diagnosis not present

## 2015-01-14 DIAGNOSIS — H25099 Other age-related incipient cataract, unspecified eye: Secondary | ICD-10-CM | POA: Diagnosis not present

## 2015-01-14 DIAGNOSIS — E109 Type 1 diabetes mellitus without complications: Secondary | ICD-10-CM | POA: Diagnosis not present

## 2015-01-14 DIAGNOSIS — H5210 Myopia, unspecified eye: Secondary | ICD-10-CM | POA: Diagnosis not present

## 2015-02-02 DIAGNOSIS — J209 Acute bronchitis, unspecified: Secondary | ICD-10-CM | POA: Diagnosis not present

## 2015-03-09 DIAGNOSIS — R110 Nausea: Secondary | ICD-10-CM | POA: Diagnosis not present

## 2015-03-18 DIAGNOSIS — F333 Major depressive disorder, recurrent, severe with psychotic symptoms: Secondary | ICD-10-CM | POA: Diagnosis not present

## 2015-03-30 DIAGNOSIS — E1122 Type 2 diabetes mellitus with diabetic chronic kidney disease: Secondary | ICD-10-CM | POA: Diagnosis not present

## 2015-03-30 DIAGNOSIS — N1831 Chronic kidney disease, stage 3a: Secondary | ICD-10-CM | POA: Insufficient documentation

## 2015-03-30 DIAGNOSIS — E1142 Type 2 diabetes mellitus with diabetic polyneuropathy: Secondary | ICD-10-CM | POA: Diagnosis not present

## 2015-03-30 DIAGNOSIS — N1832 Chronic kidney disease, stage 3b: Secondary | ICD-10-CM | POA: Insufficient documentation

## 2015-03-30 DIAGNOSIS — N183 Chronic kidney disease, stage 3 (moderate): Secondary | ICD-10-CM | POA: Diagnosis not present

## 2015-03-30 DIAGNOSIS — Z794 Long term (current) use of insulin: Secondary | ICD-10-CM | POA: Diagnosis not present

## 2015-04-16 DIAGNOSIS — E1169 Type 2 diabetes mellitus with other specified complication: Secondary | ICD-10-CM | POA: Diagnosis not present

## 2015-04-16 DIAGNOSIS — E1122 Type 2 diabetes mellitus with diabetic chronic kidney disease: Secondary | ICD-10-CM | POA: Diagnosis not present

## 2015-04-16 DIAGNOSIS — Z0000 Encounter for general adult medical examination without abnormal findings: Secondary | ICD-10-CM | POA: Diagnosis not present

## 2015-04-16 DIAGNOSIS — G4733 Obstructive sleep apnea (adult) (pediatric): Secondary | ICD-10-CM | POA: Diagnosis not present

## 2015-04-16 DIAGNOSIS — I10 Essential (primary) hypertension: Secondary | ICD-10-CM | POA: Diagnosis not present

## 2015-06-17 DIAGNOSIS — F333 Major depressive disorder, recurrent, severe with psychotic symptoms: Secondary | ICD-10-CM | POA: Diagnosis not present

## 2015-07-27 DIAGNOSIS — E1122 Type 2 diabetes mellitus with diabetic chronic kidney disease: Secondary | ICD-10-CM | POA: Diagnosis not present

## 2015-07-27 DIAGNOSIS — N183 Chronic kidney disease, stage 3 (moderate): Secondary | ICD-10-CM | POA: Diagnosis not present

## 2015-07-30 DIAGNOSIS — E1169 Type 2 diabetes mellitus with other specified complication: Secondary | ICD-10-CM | POA: Diagnosis not present

## 2015-07-30 DIAGNOSIS — E782 Mixed hyperlipidemia: Secondary | ICD-10-CM | POA: Diagnosis not present

## 2015-07-30 DIAGNOSIS — F3341 Major depressive disorder, recurrent, in partial remission: Secondary | ICD-10-CM | POA: Diagnosis not present

## 2015-07-30 DIAGNOSIS — K219 Gastro-esophageal reflux disease without esophagitis: Secondary | ICD-10-CM | POA: Diagnosis not present

## 2015-07-30 DIAGNOSIS — E1140 Type 2 diabetes mellitus with diabetic neuropathy, unspecified: Secondary | ICD-10-CM | POA: Diagnosis not present

## 2015-07-30 DIAGNOSIS — Z0000 Encounter for general adult medical examination without abnormal findings: Secondary | ICD-10-CM | POA: Diagnosis not present

## 2015-07-30 DIAGNOSIS — F419 Anxiety disorder, unspecified: Secondary | ICD-10-CM | POA: Diagnosis not present

## 2015-07-30 DIAGNOSIS — E1165 Type 2 diabetes mellitus with hyperglycemia: Secondary | ICD-10-CM | POA: Diagnosis not present

## 2015-08-03 DIAGNOSIS — Z794 Long term (current) use of insulin: Secondary | ICD-10-CM | POA: Diagnosis not present

## 2015-08-03 DIAGNOSIS — E1122 Type 2 diabetes mellitus with diabetic chronic kidney disease: Secondary | ICD-10-CM | POA: Diagnosis not present

## 2015-08-03 DIAGNOSIS — N183 Chronic kidney disease, stage 3 (moderate): Secondary | ICD-10-CM | POA: Diagnosis not present

## 2015-08-03 DIAGNOSIS — E1165 Type 2 diabetes mellitus with hyperglycemia: Secondary | ICD-10-CM | POA: Diagnosis not present

## 2015-08-03 DIAGNOSIS — E1142 Type 2 diabetes mellitus with diabetic polyneuropathy: Secondary | ICD-10-CM | POA: Diagnosis not present

## 2015-10-08 DIAGNOSIS — N183 Chronic kidney disease, stage 3 (moderate): Secondary | ICD-10-CM | POA: Diagnosis not present

## 2015-10-08 DIAGNOSIS — E1169 Type 2 diabetes mellitus with other specified complication: Secondary | ICD-10-CM | POA: Diagnosis not present

## 2015-10-08 DIAGNOSIS — E1122 Type 2 diabetes mellitus with diabetic chronic kidney disease: Secondary | ICD-10-CM | POA: Diagnosis not present

## 2015-10-08 DIAGNOSIS — E785 Hyperlipidemia, unspecified: Secondary | ICD-10-CM | POA: Diagnosis not present

## 2015-10-15 DIAGNOSIS — I10 Essential (primary) hypertension: Secondary | ICD-10-CM | POA: Diagnosis not present

## 2015-10-15 DIAGNOSIS — E785 Hyperlipidemia, unspecified: Secondary | ICD-10-CM | POA: Diagnosis not present

## 2015-10-15 DIAGNOSIS — E1122 Type 2 diabetes mellitus with diabetic chronic kidney disease: Secondary | ICD-10-CM | POA: Diagnosis not present

## 2015-10-15 DIAGNOSIS — N183 Chronic kidney disease, stage 3 (moderate): Secondary | ICD-10-CM | POA: Diagnosis not present

## 2015-10-15 DIAGNOSIS — E1169 Type 2 diabetes mellitus with other specified complication: Secondary | ICD-10-CM | POA: Diagnosis not present

## 2015-10-15 DIAGNOSIS — F325 Major depressive disorder, single episode, in full remission: Secondary | ICD-10-CM | POA: Diagnosis not present

## 2015-10-15 DIAGNOSIS — G2119 Other drug induced secondary parkinsonism: Secondary | ICD-10-CM | POA: Diagnosis not present

## 2015-10-15 DIAGNOSIS — Z6835 Body mass index (BMI) 35.0-35.9, adult: Secondary | ICD-10-CM | POA: Diagnosis not present

## 2015-11-02 DIAGNOSIS — E1165 Type 2 diabetes mellitus with hyperglycemia: Secondary | ICD-10-CM | POA: Diagnosis not present

## 2015-11-02 DIAGNOSIS — Z794 Long term (current) use of insulin: Secondary | ICD-10-CM | POA: Diagnosis not present

## 2015-11-05 ENCOUNTER — Emergency Department: Payer: Worker's Compensation

## 2015-11-05 ENCOUNTER — Emergency Department
Admission: EM | Admit: 2015-11-05 | Discharge: 2015-11-05 | Disposition: A | Discharge status: Home / Self Care | Payer: Worker's Compensation | Attending: Emergency Medicine | Admitting: Emergency Medicine

## 2015-11-05 ENCOUNTER — Encounter: Payer: Self-pay | Admitting: Emergency Medicine

## 2015-11-05 DIAGNOSIS — I10 Essential (primary) hypertension: Secondary | ICD-10-CM | POA: Insufficient documentation

## 2015-11-05 DIAGNOSIS — S8001XA Contusion of right knee, initial encounter: Secondary | ICD-10-CM | POA: Diagnosis not present

## 2015-11-05 DIAGNOSIS — S80211A Abrasion, right knee, initial encounter: Secondary | ICD-10-CM | POA: Insufficient documentation

## 2015-11-05 DIAGNOSIS — Y9289 Other specified places as the place of occurrence of the external cause: Secondary | ICD-10-CM | POA: Diagnosis not present

## 2015-11-05 DIAGNOSIS — W010XXA Fall on same level from slipping, tripping and stumbling without subsequent striking against object, initial encounter: Secondary | ICD-10-CM | POA: Insufficient documentation

## 2015-11-05 DIAGNOSIS — Z7982 Long term (current) use of aspirin: Secondary | ICD-10-CM | POA: Insufficient documentation

## 2015-11-05 DIAGNOSIS — Z79899 Other long term (current) drug therapy: Secondary | ICD-10-CM | POA: Insufficient documentation

## 2015-11-05 DIAGNOSIS — Z880 Allergy status to penicillin: Secondary | ICD-10-CM | POA: Diagnosis not present

## 2015-11-05 DIAGNOSIS — S0990XA Unspecified injury of head, initial encounter: Secondary | ICD-10-CM

## 2015-11-05 DIAGNOSIS — E119 Type 2 diabetes mellitus without complications: Secondary | ICD-10-CM | POA: Diagnosis not present

## 2015-11-05 DIAGNOSIS — Y9389 Activity, other specified: Secondary | ICD-10-CM | POA: Diagnosis not present

## 2015-11-05 DIAGNOSIS — Z7984 Long term (current) use of oral hypoglycemic drugs: Secondary | ICD-10-CM | POA: Insufficient documentation

## 2015-11-05 DIAGNOSIS — Y998 Other external cause status: Secondary | ICD-10-CM | POA: Insufficient documentation

## 2015-11-05 DIAGNOSIS — Z794 Long term (current) use of insulin: Secondary | ICD-10-CM | POA: Insufficient documentation

## 2015-11-05 DIAGNOSIS — W19XXXA Unspecified fall, initial encounter: Secondary | ICD-10-CM

## 2015-11-05 DIAGNOSIS — S8991XA Unspecified injury of right lower leg, initial encounter: Secondary | ICD-10-CM | POA: Diagnosis present

## 2015-11-05 HISTORY — DX: Depression, unspecified: F32.A

## 2015-11-05 HISTORY — DX: Type 2 diabetes mellitus without complications: E11.9

## 2015-11-05 HISTORY — DX: Major depressive disorder, single episode, unspecified: F32.9

## 2015-11-05 HISTORY — DX: Essential (primary) hypertension: I10

## 2015-11-05 LAB — BASIC METABOLIC PANEL
Anion gap: 7 (ref 5–15)
BUN: 29 mg/dL — ABNORMAL HIGH (ref 6–20)
CALCIUM: 8.7 mg/dL — AB (ref 8.9–10.3)
CO2: 22 mmol/L (ref 22–32)
Chloride: 108 mmol/L (ref 101–111)
Creatinine, Ser: 1.16 mg/dL (ref 0.61–1.24)
GFR calc Af Amer: >60 mL/min (ref >60)
GLUCOSE: 337 mg/dL — AB (ref 65–99)
Potassium: 5.4 mmol/L — ABNORMAL HIGH (ref 3.5–5.1)
SODIUM: 137 mmol/L (ref 135–145)

## 2015-11-05 LAB — URINALYSIS COMPLETE WITH MICROSCOPIC (ARMC ONLY)
BILIRUBIN URINE: NEGATIVE
Bacteria, UA: NONE SEEN
Glucose, UA: >500 mg/dL — AB
HGB URINE DIPSTICK: NEGATIVE
KETONES UR: NEGATIVE mg/dL
LEUKOCYTES UA: NEGATIVE
NITRITE: NEGATIVE
PH: 6 (ref 5.0–8.0)
Protein, ur: NEGATIVE mg/dL
Specific Gravity, Urine: 1.015 (ref 1.005–1.030)
Squamous Epithelial / LPF: NONE SEEN

## 2015-11-05 LAB — CBC
HCT: 40.9 % (ref 40.0–52.0)
HEMOGLOBIN: 13.6 g/dL (ref 13.0–18.0)
MCH: 30.9 pg (ref 26.0–34.0)
MCHC: 33.2 g/dL (ref 32.0–36.0)
MCV: 93 fL (ref 80.0–100.0)
Platelets: 175 10*3/uL (ref 150–440)
RBC: 4.4 MIL/uL (ref 4.40–5.90)
RDW: 14 % (ref 11.5–14.5)
WBC: 6.6 10*3/uL (ref 3.8–10.6)

## 2015-11-05 LAB — TROPONIN I

## 2015-11-05 NOTE — ED Notes (Signed)
This RN spoke with supervisor of [employer], Rhys Martini, [phone])  Supervisor states UDS only needed to complete workers comp.

## 2015-11-05 NOTE — Discharge Instructions (Signed)
Concussion, Adult A concussion, or closed-head injury, is a brain injury caused by a direct blow to the head or by a quick and sudden movement (jolt) of the head or neck. Concussions are usually not life-threatening. Even so, the effects of a concussion can be serious. If you have had a concussion before, you are more likely to experience concussion-like symptoms after a direct blow to the head.  CAUSES  Direct blow to the head, such as from running into another player during a soccer game, being hit in a fight, or hitting your head on a hard surface.  A jolt of the head or neck that causes the brain to move back and forth inside the skull, such as in a car crash. SIGNS AND SYMPTOMS The signs of a concussion can be hard to notice. Early on, they may be missed by you, family members, and health care providers. You may look fine but act or feel differently. Symptoms are usually temporary, but they may last for days, weeks, or even longer. Some symptoms may appear right away while others may not show up for hours or days. Every head injury is different. Symptoms include:  Mild to moderate headaches that will not go away.  A feeling of pressure inside your head.  Having more trouble than usual:  Learning or remembering things you have heard.  Answering questions.  Paying attention or concentrating.  Organizing daily tasks.  Making decisions and solving problems.  Slowness in thinking, acting or reacting, speaking, or reading.  Getting lost or being easily confused.  Feeling tired all the time or lacking energy (fatigued).  Feeling drowsy.  Sleep disturbances.  Sleeping more than usual.  Sleeping less than usual.  Trouble falling asleep.  Trouble sleeping (insomnia).  Loss of balance or feeling lightheaded or dizzy.  Nausea or vomiting.  Numbness or tingling.  Increased sensitivity to:  Sounds.  Lights.  Distractions.  Vision problems or eyes that tire  easily.  Diminished sense of taste or smell.  Ringing in the ears.  Mood changes such as feeling sad or anxious.  Becoming easily irritated or angry for little or no reason.  Lack of motivation.  Seeing or hearing things other people do not see or hear (hallucinations). DIAGNOSIS Your health care provider can usually diagnose a concussion based on a description of your injury and symptoms. He or she will ask whether you passed out (lost consciousness) and whether you are having trouble remembering events that happened right before and during your injury. Your evaluation might include:  A brain scan to look for signs of injury to the brain. Even if the test shows no injury, you may still have a concussion.  Blood tests to be sure other problems are not present. TREATMENT  Concussions are usually treated in an emergency department, in urgent care, or at a clinic. You may need to stay in the hospital overnight for further treatment.  Tell your health care provider if you are taking any medicines, including prescription medicines, over-the-counter medicines, and natural remedies. Some medicines, such as blood thinners (anticoagulants) and aspirin, may increase the chance of complications. Also tell your health care provider whether you have had alcohol or are taking illegal drugs. This information may affect treatment.  Your health care provider will send you home with important instructions to follow.  How fast you will recover from a concussion depends on many factors. These factors include how severe your concussion is, what part of your brain was injured,  your age, and how healthy you were before the concussion.  Most people with mild injuries recover fully. Recovery can take time. In general, recovery is slower in older persons. Also, persons who have had a concussion in the past or have other medical problems may find that it takes longer to recover from their current injury. HOME  CARE INSTRUCTIONS General Instructions  Carefully follow the directions your health care provider gave you.  Only take over-the-counter or prescription medicines for pain, discomfort, or fever as directed by your health care provider.  Take only those medicines that your health care provider has approved.  Do not drink alcohol until your health care provider says you are well enough to do so. Alcohol and certain other drugs may slow your recovery and can put you at risk of further injury.  If it is harder than usual to remember things, write them down.  If you are easily distracted, try to do one thing at a time. For example, do not try to watch TV while fixing dinner.  Talk with family members or close friends when making important decisions.  Keep all follow-up appointments. Repeated evaluation of your symptoms is recommended for your recovery.  Watch your symptoms and tell others to do the same. Complications sometimes occur after a concussion. Older adults with a brain injury may have a higher risk of serious complications, such as a blood clot on the brain.  Tell your teachers, school nurse, school counselor, coach, athletic trainer, or work Freight forwarder about your injury, symptoms, and restrictions. Tell them about what you can or cannot do. They should watch for:  Increased problems with attention or concentration.  Increased difficulty remembering or learning new information.  Increased time needed to complete tasks or assignments.  Increased irritability or decreased ability to cope with stress.  Increased symptoms.  Rest. Rest helps the brain to heal. Make sure you:  Get plenty of sleep at night. Avoid staying up late at night.  Keep the same bedtime hours on weekends and weekdays.  Rest during the day. Take daytime naps or rest breaks when you feel tired.  Limit activities that require a lot of thought or concentration. These include:  Doing homework or job-related  work.  Watching TV.  Working on the computer.  Avoid any situation where there is potential for another head injury (football, hockey, soccer, basketball, martial arts, downhill snow sports and horseback riding). Your condition will get worse every time you experience a concussion. You should avoid these activities until you are evaluated by the appropriate follow-up health care providers. Returning To Your Regular Activities You will need to return to your normal activities slowly, not all at once. You must give your body and brain enough time for recovery.  Do not return to sports or other athletic activities until your health care provider tells you it is safe to do so.  Ask your health care provider when you can drive, ride a bicycle, or operate heavy machinery. Your ability to react may be slower after a brain injury. Never do these activities if you are dizzy.  Ask your health care provider about when you can return to work or school. Preventing Another Concussion It is very important to avoid another brain injury, especially before you have recovered. In rare cases, another injury can lead to permanent brain damage, brain swelling, or death. The risk of this is greatest during the first 7-10 days after a head injury. Avoid injuries by:  Wearing a  seat belt when riding in a car.  Drinking alcohol only in moderation.  Wearing a helmet when biking, skiing, skateboarding, skating, or doing similar activities.  Avoiding activities that could lead to a second concussion, such as contact or recreational sports, until your health care provider says it is okay.  Taking safety measures in your home.  Remove clutter and tripping hazards from floors and stairways.  Use grab bars in bathrooms and handrails by stairs.  Place non-slip mats on floors and in bathtubs.  Improve lighting in dim areas. SEEK MEDICAL CARE IF:  You have increased problems paying attention or  concentrating.  You have increased difficulty remembering or learning new information.  You need more time to complete tasks or assignments than before.  You have increased irritability or decreased ability to cope with stress.  You have more symptoms than before. Seek medical care if you have any of the following symptoms for more than 2 weeks after your injury:  Lasting (chronic) headaches.  Dizziness or balance problems.  Nausea.  Vision problems.  Increased sensitivity to noise or light.  Depression or mood swings.  Anxiety or irritability.  Memory problems.  Difficulty concentrating or paying attention.  Sleep problems.  Feeling tired all the time. SEEK IMMEDIATE MEDICAL CARE IF:  You have severe or worsening headaches. These may be a sign of a blood clot in the brain.  You have weakness (even if only in one hand, leg, or part of the face).  You have numbness.  You have decreased coordination.  You vomit repeatedly.  You have increased sleepiness.  One pupil is larger than the other.  You have convulsions.  You have slurred speech.  You have increased confusion. This may be a sign of a blood clot in the brain.  You have increased restlessness, agitation, or irritability.  You are unable to recognize people or places.  You have neck pain.  It is difficult to wake you up.  You have unusual behavior changes.  You lose consciousness. MAKE SURE YOU:  Understand these instructions.  Will watch your condition.  Will get help right away if you are not doing well or get worse.   This information is not intended to replace advice given to you by your health care provider. Make sure you discuss any questions you have with your health care provider.   Document Released: 11/19/2003 Document Revised: 09/19/2014 Document Reviewed: 03/21/2013 Elsevier Interactive Patient Education 2016 [place] A contusion is a deep bruise.  Contusions are the result of a blunt injury to tissues and muscle fibers under the skin. The injury causes bleeding under the skin. The skin overlying the contusion may turn blue, purple, or yellow. Minor injuries will give you a painless contusion, but more severe contusions may stay painful and swollen for a few weeks.  CAUSES  This condition is usually caused by a blow, trauma, or direct force to an area of the body. SYMPTOMS  Symptoms of this condition include:  Swelling of the injured area.  Pain and tenderness in the injured area.  Discoloration. The area may have redness and then turn blue, purple, or yellow. DIAGNOSIS  This condition is diagnosed based on a physical exam and medical history. An X-ray, CT scan, or MRI may be needed to determine if there are any associated injuries, such as broken bones (fractures). TREATMENT  Specific treatment for this condition depends on what area of the body was injured. In general, the best treatment  for a contusion is resting, icing, applying pressure to (compression), and elevating the injured area. This is often called the RICE strategy. Over-the-counter anti-inflammatory medicines may also be recommended for pain control.  HOME CARE INSTRUCTIONS   Rest the injured area.  If directed, apply ice to the injured area:  Put ice in a plastic bag.  Place a towel between your skin and the bag.  Leave the ice on for 20 minutes, 2-3 times per day.  If directed, apply light compression to the injured area using an elastic bandage. Make sure the bandage is not wrapped too tightly. Remove and reapply the bandage as directed by your health care provider.  If possible, raise (elevate) the injured area above the level of your heart while you are sitting or lying down.  Take over-the-counter and prescription medicines only as told by your health care provider. SEEK MEDICAL CARE IF:  Your symptoms do not improve after several days of  treatment.  Your symptoms get worse.  You have difficulty moving the injured area. SEEK IMMEDIATE MEDICAL CARE IF:   You have severe pain.  You have numbness in a hand or foot.  Your hand or foot turns pale or cold.   This information is not intended to replace advice given to you by your health care provider. Make sure you discuss any questions you have with your health care provider.   Document Released: 06/08/2005 Document Revised: 05/20/2015 Document Reviewed: 01/14/2015 Elsevier Interactive Patient Education 2016 [place] Injury, Adult You have a head injury. Headaches and throwing up (vomiting) are common after a head injury. It should be easy to wake up from sleeping. Sometimes you must stay in the hospital. Most problems happen within the first 24 hours. Side effects may occur up to 7-10 days after the injury.  WHAT ARE THE TYPES OF HEAD INJURIES? Head injuries can be as minor as a bump. Some head injuries can be more severe. More severe head injuries include:  A jarring injury to the brain (concussion).  A bruise of the brain (contusion). This mean there is bleeding in the brain that can cause swelling.  A cracked skull (skull fracture).  Bleeding in the brain that collects, clots, and forms a bump (hematoma). WHEN SHOULD I GET HELP RIGHT AWAY?   You are confused or sleepy.  You cannot be woken up.  You feel sick to your stomach (nauseous) or keep throwing up (vomiting).  Your dizziness or unsteadiness is getting worse.  You have very bad, lasting headaches that are not helped by medicine. Take medicines only as told by your doctor.  You cannot use your arms or legs like normal.  You cannot walk.  You notice changes in the black spots in the center of the colored part of your eye (pupil).  You have clear or bloody fluid coming from your nose or ears.  You have trouble seeing. During the next 24 hours after the injury, you must stay with someone  who can watch you. This person should get help right away (call 911 in the [place] if you start to shake and are not able to control it (have seizures), you pass out, or you are unable to wake up. HOW CAN I PREVENT A HEAD INJURY IN THE FUTURE?  Wear seat belts.  Wear a helmet while bike riding and playing sports like football.  Stay away from dangerous activities around the house. WHEN CAN I RETURN TO NORMAL ACTIVITIES AND ATHLETICS? See your  doctor before doing these activities. You should not do normal activities or play contact sports until 1 week after the following symptoms have stopped:  Headache that does not go away.  Dizziness.  Poor attention.  Confusion.  Memory problems.  Sickness to your stomach or throwing up.  Tiredness.  Fussiness.  Bothered by bright lights or loud noises.  Anxiousness or depression.  Restless sleep. MAKE SURE YOU:   Understand these instructions.  Will watch your condition.  Will get help right away if you are not doing well or get worse.   This information is not intended to replace advice given to you by your health care provider. Make sure you discuss any questions you have with your health care provider.   Document Released: 08/11/2008 Document Revised: 09/19/2014 Document Reviewed: 05/06/2013 Elsevier Interactive Patient Education [employer].

## 2015-11-05 NOTE — ED Notes (Signed)
Patient transported to CT via stretcher.

## 2015-11-05 NOTE — ED Provider Notes (Signed)
[facility] Emergency Department Provider Note  ____________________________________________  Time seen: Approximately 1:33 AM  I have reviewed the triage vital signs and the nursing notes.   HISTORY  Chief Complaint Fall and Possible syncope     HPI Patrick Turner is a 70 y.o. 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 Patient states he thinks he tripped over a speed bump but reports that he did not remember falling. He thinks he hit his head. Per EMS, patient appeared dazed and confused on site. Initially patient complained of left shoulder and neck pain which has since resolved on arrival to the emergency department. Patient states earlier last evening he had one episode of vomiting and diarrhea. Denies fever, chills, chest pain, shortness of breath, dysuria. Denies recent travel. Voicing no complaints of pain currently.   Past Medical History  Diagnosis Date  . Diabetes mellitus without complication ([place])   . Hypertension   . Depression     Patient Active Problem List   Diagnosis Date Noted  . Unspecified transient cerebral ischemia 05/29/2012    Past Surgical History  Procedure Laterality Date  . Appendectomy    . Tonsillectomy      Current Outpatient Rx  Name  Route  Sig  Dispense  Refill  . amLODipine (NORVASC) 5 MG tablet   Oral   Take 5 mg by mouth daily.         Marland Kitchen aspirin EC 81 MG tablet   Oral   Take 81 mg by mouth every morning.         . gabapentin (NEURONTIN) 800 MG tablet   Oral   Take 800 mg by mouth 3 (three) times daily.         . insulin lispro protamine-insulin lispro (HUMALOG 50/50) (50-50) 100 UNIT/ML SUSP   Subcutaneous   Inject 40-45 Units into the skin 2 (two) times daily before a meal. Takes 40 units in the am & 45 units in the pm         . lisinopril-hydrochlorothiazide (PRINZIDE,ZESTORETIC) 20-12.5 MG per tablet   Oral   Take 1 tablet by  mouth 2 (two) times daily.         Marland Kitchen LORazepam (ATIVAN) 0.5 MG tablet   Oral   Take 0.5 mg by mouth 2 (two) times daily as needed. For anxiety         . metFORMIN (GLUMETZA) 500 MG (MOD) 24 hr tablet   Oral   Take 1,000 mg by mouth 2 (two) times daily with a meal.         . omeprazole (PRILOSEC) 40 MG capsule   Oral   Take 40 mg by mouth every morning.         Marland Kitchen PARoxetine (PAXIL) 40 MG tablet   Oral   Take 40 mg by mouth at bedtime.         . pravastatin (PRAVACHOL) 80 MG tablet   Oral   Take 80 mg by mouth at bedtime.         . topiramate (TOPAMAX) 100 MG tablet   Oral   Take 100 mg by mouth 2 (two) times daily.         . ziprasidone (GEODON) 20 MG capsule   Oral   Take 20 mg by mouth at bedtime.         Marland Kitchen zolpidem (AMBIEN) 10 MG tablet   Oral  Take 10 mg by mouth at bedtime as needed. For sleep           Allergies Penicillins and Tavist  No family history on file.  Social History Social History  Substance Use Topics  . Smoking status: Never Smoker   . Smokeless tobacco: None  . Alcohol Use: No    Review of Systems Constitutional: Positive for fall. No fever/chills. Eyes: No visual changes. ENT: No sore throat. Cardiovascular: Denies chest pain. Respiratory: Denies shortness of breath. Gastrointestinal: No abdominal pain.  No nausea, no vomiting.  No diarrhea.  No constipation. Genitourinary: Negative for dysuria. Musculoskeletal: Negative for back pain. Skin: Negative for rash. Neurological: Negative for headaches, focal weakness or numbness.  10-point ROS otherwise negative.  ____________________________________________   PHYSICAL EXAM:  VITAL SIGNS: ED Triage Vitals  Enc Vitals Group     BP 11/05/15 0050 156/70 mmHg     Pulse Rate 11/05/15 0050 73     Resp 11/05/15 0050 18     Temp 11/05/15 0050 97.9 F (36.6 C)     Temp Source 11/05/15 0050 Oral     SpO2 11/05/15 0050 100 %     Weight 11/05/15 0101 215 lb (97.523  kg)     Height 11/05/15 0101 5\' 4"  (1.626 m)     Head Cir --      Peak Flow --      Pain Score 11/05/15 0102 3     Pain Loc --      Pain Edu? --      Excl. in [place]? --     Constitutional: Alert and oriented. Well appearing and in no acute distress. Eyes: Conjunctivae are normal. PERRL. EOMI. Head: Atraumatic. Nose: No congestion/rhinnorhea. Mouth/Throat: Mucous membranes are moist.  Oropharynx non-erythematous. Neck: No stridor.  No cervical spine tenderness to palpation.  No step-offs or deformities noted. Cardiovascular: Normal rate, regular rhythm. Grossly normal heart sounds.  Good peripheral circulation. Respiratory: Normal respiratory effort.  No retractions. Lungs CTAB. Gastrointestinal: Soft and nontender. No distention. No abdominal bruits. No CVA tenderness. Musculoskeletal: Abrasion to anterior right knee with developing ecchymosis. Range of motion without tenderness. No lower extremity tenderness nor edema.  No joint effusions. Neurologic:  Normal speech and language. No gross focal neurologic deficits are appreciated.  Skin:  Skin is warm, dry and intact. No rash noted. Psychiatric: Mood and affect are normal. Speech and behavior are normal.  ____________________________________________   LABS (all labs ordered are listed, but only abnormal results are displayed)  Labs Reviewed  BASIC METABOLIC PANEL - Abnormal; Notable for the following:    Potassium 5.4 (*)    Glucose, Bld 337 (*)    BUN 29 (*)    Calcium 8.7 (*)    All other components within normal limits  URINALYSIS COMPLETEWITH MICROSCOPIC ([facility] ONLY) - Abnormal; Notable for the following:    Color, Urine YELLOW (*)    APPearance CLEAR (*)    Glucose, UA >500 (*)    All other components within normal limits  CBC  TROPONIN I   ____________________________________________  EKG  ED ECG REPORT I, SUNG,JADE J, the attending physician, personally viewed and interpreted this ECG.   Date: 11/05/2015  EKG  Time: 0058  Rate: 72  Rhythm: normal EKG, normal sinus rhythm  Axis: Normal  Intervals:none  ST&T Change: Nonspecific  ____________________________________________  RADIOLOGY  CT head and cervical spine interpreted per Dr. Radene Knee: 1. No evidence of traumatic intracranial injury or fracture. 2. No evidence of fracture  or subluxation along the cervical spine. 3. Mild degenerative change along the lower cervical spine. 4. 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  Chest 1 view (viewed by me, interpreted per Dr. Radene Knee): No acute cardiopulmonary process seen. No displaced rib fractures identified.  Right knee x-rays (viewed by me, interpreted per Dr. Marisue Humble): No fracture or dislocation. Anterior soft tissue edema.  Moderate tricompartmental osteoarthritis. ____________________________________________   PROCEDURES  Procedure(s) performed: None  Critical Care performed: No  ____________________________________________   INITIAL IMPRESSION / ASSESSMENT AND PLAN / ED COURSE  Pertinent labs & imaging results that were available during my care of the patient were reviewed by me and considered in my medical decision making (see chart for details).  70 year old male who presents after a fall while at work. Will obtain screening lab work, EKG and imaging studies of head, neck and right knee.  ----------------------------------------- 6:13 AM on 11/05/2015 -----------------------------------------  Delay secondary to other critical patient. Patient is sitting up comfortably filling out Workmen's Compensation paperwork. Updated patient of laboratory and imaging results. Patient is alert and oriented without focal neurological deficits. Return precautions given. Patient verbalizes understanding and agrees with plan of care. ____________________________________________   FINAL CLINICAL IMPRESSION(S) / ED DIAGNOSES  Final diagnoses:  Fall, initial encounter  Knee  contusion, right, initial encounter  Minor head injury, initial encounter      Paulette Blanch, MD 11/05/15 [phone]

## 2015-11-05 NOTE — ED Notes (Signed)

## 2015-11-05 NOTE — ED Notes (Signed)
Pt presents to ED via [facility] from [employer]. 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 Pt reports tripped over speed bump but reports "I don't remember the fall." Reports unsure if he hit his head. Per EMS pt confused on site. Pt c/o left shoulder pain and neck pain. Pt denies chest pain, shortness of breath, or abdominal pain. Pt alert and oriented x4, no increased work in breathing noted, skin warm and dry.

## 2015-11-09 DIAGNOSIS — Z794 Long term (current) use of insulin: Secondary | ICD-10-CM | POA: Diagnosis not present

## 2015-11-09 DIAGNOSIS — N183 Chronic kidney disease, stage 3 (moderate): Secondary | ICD-10-CM | POA: Diagnosis not present

## 2015-11-09 DIAGNOSIS — E1142 Type 2 diabetes mellitus with diabetic polyneuropathy: Secondary | ICD-10-CM | POA: Diagnosis not present

## 2015-11-09 DIAGNOSIS — E1122 Type 2 diabetes mellitus with diabetic chronic kidney disease: Secondary | ICD-10-CM | POA: Diagnosis not present

## 2015-11-09 DIAGNOSIS — E1165 Type 2 diabetes mellitus with hyperglycemia: Secondary | ICD-10-CM | POA: Diagnosis not present

## 2015-12-10 DIAGNOSIS — E1122 Type 2 diabetes mellitus with diabetic chronic kidney disease: Secondary | ICD-10-CM | POA: Diagnosis not present

## 2015-12-10 DIAGNOSIS — E7800 Pure hypercholesterolemia, unspecified: Secondary | ICD-10-CM | POA: Diagnosis not present

## 2015-12-10 DIAGNOSIS — N183 Chronic kidney disease, stage 3 (moderate): Secondary | ICD-10-CM | POA: Diagnosis not present

## 2015-12-10 DIAGNOSIS — Z0100 Encounter for examination of eyes and vision without abnormal findings: Secondary | ICD-10-CM | POA: Diagnosis not present

## 2015-12-10 DIAGNOSIS — I10 Essential (primary) hypertension: Secondary | ICD-10-CM | POA: Diagnosis not present

## 2015-12-10 DIAGNOSIS — E109 Type 1 diabetes mellitus without complications: Secondary | ICD-10-CM | POA: Diagnosis not present

## 2015-12-10 DIAGNOSIS — R1032 Left lower quadrant pain: Secondary | ICD-10-CM | POA: Diagnosis not present

## 2015-12-18 DIAGNOSIS — F333 Major depressive disorder, recurrent, severe with psychotic symptoms: Secondary | ICD-10-CM | POA: Diagnosis not present

## 2016-01-10 ENCOUNTER — Emergency Department: Payer: Commercial Managed Care - HMO

## 2016-01-10 ENCOUNTER — Encounter: Payer: Self-pay | Admitting: Emergency Medicine

## 2016-01-10 ENCOUNTER — Emergency Department
Admission: EM | Admit: 2016-01-10 | Discharge: 2016-01-10 | Disposition: A | Discharge status: Home / Self Care | Payer: Commercial Managed Care - HMO | Attending: Emergency Medicine | Admitting: Emergency Medicine

## 2016-01-10 DIAGNOSIS — I10 Essential (primary) hypertension: Secondary | ICD-10-CM | POA: Insufficient documentation

## 2016-01-10 DIAGNOSIS — M25561 Pain in right knee: Secondary | ICD-10-CM

## 2016-01-10 DIAGNOSIS — E119 Type 2 diabetes mellitus without complications: Secondary | ICD-10-CM | POA: Diagnosis not present

## 2016-01-10 DIAGNOSIS — F329 Major depressive disorder, single episode, unspecified: Secondary | ICD-10-CM | POA: Diagnosis not present

## 2016-01-10 DIAGNOSIS — M7989 Other specified soft tissue disorders: Secondary | ICD-10-CM | POA: Diagnosis not present

## 2016-01-10 DIAGNOSIS — Z7982 Long term (current) use of aspirin: Secondary | ICD-10-CM | POA: Insufficient documentation

## 2016-01-10 DIAGNOSIS — Z7984 Long term (current) use of oral hypoglycemic drugs: Secondary | ICD-10-CM | POA: Insufficient documentation

## 2016-01-10 DIAGNOSIS — M25461 Effusion, right knee: Secondary | ICD-10-CM | POA: Diagnosis not present

## 2016-01-10 HISTORY — DX: Pure hypercholesterolemia, unspecified: E78.00

## 2016-01-10 HISTORY — DX: Anxiety disorder, unspecified: F41.9

## 2016-01-10 LAB — COMPREHENSIVE METABOLIC PANEL
ALBUMIN: 3.8 g/dL (ref 3.5–5.0)
ALT: 15 U/L — AB (ref 17–63)
ANION GAP: 9 (ref 5–15)
AST: 20 U/L (ref 15–41)
Alkaline Phosphatase: 65 U/L (ref 38–126)
BUN: 27 mg/dL — ABNORMAL HIGH (ref 6–20)
CALCIUM: 8.6 mg/dL — AB (ref 8.9–10.3)
CHLORIDE: 109 mmol/L (ref 101–111)
CO2: 22 mmol/L (ref 22–32)
Creatinine, Ser: 1.3 mg/dL — ABNORMAL HIGH (ref 0.61–1.24)
GFR calc non Af Amer: 54 mL/min — ABNORMAL LOW (ref >60)
Glucose, Bld: 249 mg/dL — ABNORMAL HIGH (ref 65–99)
POTASSIUM: 4 mmol/L (ref 3.5–5.1)
Sodium: 140 mmol/L (ref 135–145)
Total Bilirubin: 0.5 mg/dL (ref 0.3–1.2)
Total Protein: 6.8 g/dL (ref 6.5–8.1)

## 2016-01-10 LAB — CBC WITH DIFFERENTIAL/PLATELET
BASOS ABS: 0.1 10*3/uL (ref 0–0.1)
BASOS PCT: 1 %
Eosinophils Absolute: 0.1 10*3/uL (ref 0–0.7)
Eosinophils Relative: 1 %
HEMATOCRIT: 41 % (ref 40.0–52.0)
Hemoglobin: 13.6 g/dL (ref 13.0–18.0)
LYMPHS PCT: 18 %
Lymphs Abs: 1.6 10*3/uL (ref 1.0–3.6)
MCH: 30.9 pg (ref 26.0–34.0)
MCHC: 33.2 g/dL (ref 32.0–36.0)
MCV: 92.8 fL (ref 80.0–100.0)
MONOS PCT: 9 %
Monocytes Absolute: 0.8 10*3/uL (ref 0.2–1.0)
NEUTROS PCT: 71 %
Neutro Abs: 6.1 10*3/uL (ref 1.4–6.5)
PLATELETS: 211 10*3/uL (ref 150–440)
RBC: 4.42 MIL/uL (ref 4.40–5.90)
RDW: 14.2 % (ref 11.5–14.5)
WBC: 8.7 10*3/uL (ref 3.8–10.6)

## 2016-01-10 MED ORDER — HYDROCODONE-ACETAMINOPHEN 5-325 MG PO TABS: 2.0000 | ORAL_TABLET | Freq: Four times a day (QID) | ORAL | Status: DC | PRN

## 2016-01-10 NOTE — Discharge Instructions (Signed)
Joint Pain Joint pain, which is also called arthralgia, can be caused by many things. Joint pain often goes away when you follow your health care provider's instructions for relieving pain at home. However, joint pain can also be caused by conditions that require further treatment. Common causes of joint pain include:  Bruising in the area of the joint.  Overuse of the joint.  Wear and tear on the joints that occur with aging (osteoarthritis).  Various other forms of arthritis.  A buildup of a crystal form of uric acid in the joint (gout).  Infections of the joint (septic arthritis) or of the bone (osteomyelitis). Your health care provider may recommend medicine to help with the pain. If your joint pain continues, additional tests may be needed to diagnose your condition. HOME CARE INSTRUCTIONS Watch your condition for any changes. Follow these instructions as directed to lessen the pain that you are feeling.  Take medicines only as directed by your health care provider.  Rest the affected area for as long as your health care provider says that you should. If directed to do so, raise the painful joint above the level of your heart while you are sitting or lying down.  Do not do things that cause or worsen pain.  If directed, apply ice to the painful area:  Put ice in a plastic bag.  Place a towel between your skin and the bag.  Leave the ice on for 20 minutes, 2-3 times per day.  Wear an elastic bandage, splint, or sling as directed by your health care provider. Loosen the elastic bandage or splint if your fingers or toes become numb and tingle, or if they turn cold and blue.  Begin exercising or stretching the affected area as directed by your health care provider. Ask your health care provider what types of exercise are safe for you.  Keep all follow-up visits as directed by your health care provider. This is important. SEEK MEDICAL CARE IF:  Your pain increases, and medicine  does not help.  Your joint pain does not improve within 3 days.  You have increased bruising or swelling.  You have a fever.  You lose 10 lb (4.5 kg) or more without trying. SEEK IMMEDIATE MEDICAL CARE IF:  You are not able to move the joint.  Your fingers or toes become numb or they turn cold and blue.   This information is not intended to replace advice given to you by your health care provider. Make sure you discuss any questions you have with your health care provider.   Document Released: 08/29/2005 Document Revised: 09/19/2014 Document Reviewed: 06/10/2014 Elsevier Interactive Patient Education [employer].   Please use the Vicodin if needed for pain. 1 pill 4 times a day every 6 hours. If after an hour you not getting enough pain relief she can try a second pill and then increasing to 2 pills every 6 hours. Please be careful the Vicodin can make you constipated and sleepy. Do not fall or break anything. Use the walker which are a temperature prescription for to help you walk for the time being. Please follow-up with Dr. Sabra Heck the orthopedic surgeon. Call him Monday morning or about 9:00 he should be able to get you an appointment in approximately a week. Please return for increased pain increased swelling redness fever or any other problems.

## 2016-01-10 NOTE — ED Notes (Signed)
Patient s/p fall 5 weeks ago at work, seen at this facility. DG and CT complete. However, patient states that there was not any diagnostic imaging to right knee which was injured in the fall. Denies injury since initial fall.  Patient describes pain as "beginning in the instep, a throbbing pain with a pins and needles pain on the right side of my leg". Patient states the pain is in his "whole damn leg". Patient states his medial right leg is swelling. No swelling noted. Patient states nothing helps with pain.   Hx Diabetes and peripheral neuropathy.  Patient ambulatory with cane

## 2016-01-10 NOTE — ED Notes (Signed)
Pt presents to ED with right leg pain and swelling for three days. Pt reports pain worsens when he moves. Pt states pain begins at his hip and radiates to his foot. Strong pedal pulses right foot. Pt states he has a history of diabetes.

## 2016-01-10 NOTE — ED Notes (Signed)
Patient works on his feet for long periods of time

## 2016-01-10 NOTE — ED Provider Notes (Signed)
[facility] Emergency Department Provider Note   ____________________________________________  Time seen: Approximately 9:55 AM  I have reviewed the triage vital signs and the nursing notes.   HISTORY  Chief Complaint Leg Pain   HPI Patrick Turner is a 70 y.o. male who reports 6 weeks ago he tripped and fell and hurt his right knee. X-ray was negative at that time 2 weeks ago he had pain in the knee and the leg quite severe but it got R after resting for about 7 hours. Yesterday he began having pain in the leg started in the instep and went up the whole leg into the knee he rested again but it still hurting today and swollen mostly tenderness is now laterally above the knee. The area with most pain is swollen about the size of the palmar hand again just above into the lateral side of the knee hurts to move push hurts to walk on it. Slightly better than yesterday but still severe achy pain he has no other complaints is no redness or erythema no increased warmth no swelling below the knee.   Past Medical History  Diagnosis Date  . Diabetes mellitus without complication ([place])   . Hypertension   . Depression   . Anxiety   . Hypercholesteremia     Patient Active Problem List   Diagnosis Date Noted  . Unspecified transient cerebral ischemia 05/29/2012    Past Surgical History  Procedure Laterality Date  . Appendectomy    . Tonsillectomy      Current Outpatient Rx  Name  Route  Sig  Dispense  Refill  . ACCU-CHEK SOFTCLIX LANCETS lancets      1 each 3 (three) times daily.      1   . aspirin EC 325 MG tablet   Oral   Take 325 mg by mouth daily.         Marland Kitchen atorvastatin (LIPITOR) 80 MG tablet   Oral   Take 80 mg by mouth at bedtime.         . clonazePAM (KLONOPIN) 1 MG tablet   Oral   Take 0.5 mg by mouth at bedtime.      2   . gabapentin (NEURONTIN) 800 MG tablet   Oral   Take 800 mg by mouth 2 (two) times daily.          Marland Kitchen  LEVEMIR 100 UNIT/ML injection   Subcutaneous   Inject 18 Units into the skin at bedtime.      3     Dispense as written.   Marland Kitchen lisinopril-hydrochlorothiazide (PRINZIDE,ZESTORETIC) 20-12.5 MG per tablet   Oral   Take 1 tablet by mouth daily.          Marland Kitchen LORazepam (ATIVAN) 0.5 MG tablet   Oral   Take 0.5 mg by mouth 2 (two) times daily as needed. For anxiety         . metFORMIN (GLUCOPHAGE-XR) 500 MG 24 hr tablet   Oral   Take 1,000 mg by mouth 2 (two) times daily.         Marland Kitchen omeprazole (PRILOSEC) 40 MG capsule   Oral   Take 40 mg by mouth every morning.         Marland Kitchen PARoxetine (PAXIL) 40 MG tablet   Oral   Take 40 mg by mouth at bedtime.         . pioglitazone (ACTOS) 15 MG tablet   Oral   Take 15 mg by  mouth daily.      1   . QUEtiapine (SEROQUEL) 100 MG tablet   Oral   Take 100 mg by mouth at bedtime.         . topiramate (TOPAMAX) 100 MG tablet   Oral   Take 100 mg by mouth 2 (two) times daily.         Marland Kitchen HYDROcodone-acetaminophen (NORCO/VICODIN) 5-325 MG tablet   Oral   Take 2 tablets by mouth every 6 (six) hours as needed for moderate pain.   20 tablet   0     Allergies Levemir; Penicillins; and Tavist  No family history on file.  Social History Social History  Substance Use Topics  . Smoking status: Never Smoker   . Smokeless tobacco: None  . Alcohol Use: No    Review of Systems Constitutional: No fever/chills Eyes: No visual changes. ENT: No sore throat. Cardiovascular: Denies chest pain. Respiratory: Denies shortness of breath. Gastrointestinal: No abdominal pain.  No nausea, no vomiting.  No diarrhea.  No constipation. Genitourinary: Negative for dysuria. Musculoskeletal: Negative for back pain. Skin: Negative for rash. Neurological: Negative for headaches, focal weakness or numbness.  10-point ROS otherwise negative.  ____________________________________________   PHYSICAL EXAM:  VITAL SIGNS: ED Triage Vitals  Enc  Vitals Group     BP 01/10/16 0743 138/62 mmHg     Pulse Rate 01/10/16 0743 82     Resp 01/10/16 0743 18     Temp 01/10/16 0743 98.1 F (36.7 C)     Temp Source 01/10/16 0743 Oral     SpO2 01/10/16 0743 98 %     Weight 01/10/16 0743 219 lb (99.338 kg)     Height 01/10/16 0743 5\' 5"  (1.651 m)     Head Cir --      Peak Flow --      Pain Score 01/10/16 0744 8     Pain Loc --      Pain Edu? --    Constitutional: Alert and oriented. Well appearing and in no acute distress. Eyes: Conjunctivae are normal. PERRL. EOMI. Head: Atraumatic. Nose: No congestion/rhinnorhea. Mouth/Throat: Mucous membranes are moist.  Oropharynx non-erythematous. Neck: No stridor.  Cardiovascular: Normal rate, regular rhythm. Grossly normal heart sounds.  Good peripheral circulation. Respiratory: Normal respiratory effort.  No retractions. Lungs CTAB. Gastrointestinal: Soft and nontender. No distention. No abdominal bruits. No CVA tenderness. Musculoskeletal: Right leg as noted in history of present illness. Knee itself does not appear to have an effusion in it. Swelling is worst just above into the lateral portion of the right knee there is some swelling around the knee and above what I described in history of present illness as well but most of it is located in that area. There is no erythema no warmth there is tender to palpation normal distal pulses and sensation. No joint effusions. Neurologic:  Normal speech and language. No gross focal neurologic deficits are appreciated. No gait instability. Skin:  Skin is warm, dry and intact. No rash noted. Psychiatric: Mood and affect are normal. Speech and behavior are normal.  ____________________________________________   LABS (all labs ordered are listed, but only abnormal results are displayed)  Labs Reviewed  COMPREHENSIVE METABOLIC PANEL - Abnormal; Notable for the following:    Glucose, Bld 249 (*)    BUN 27 (*)    Creatinine, Ser 1.30 (*)    Calcium 8.6 (*)     ALT 15 (*)    GFR calc non Af Amer 54 (*)  All other components within normal limits  CBC WITH DIFFERENTIAL/PLATELET   ____________________________________________  EKG   ____________________________________________  RADIOLOGY Radiology reads the x-rays severe tricompartmental osteoarthritis. Radiology reads ultrasound leg as no DVT. ____________________________________________   PROCEDURES    ____________________________________________   INITIAL IMPRESSION / ASSESSMENT AND PLAN / ED COURSE  Pertinent labs & imaging results that were available during my care of the patient were reviewed by me and considered in my medical decision making (see chart for details).   ____________________________________________   FINAL CLINICAL IMPRESSION(S) / ED DIAGNOSES  Final diagnoses:  Knee pain, acute, right      NEW MEDICATIONS STARTED DURING THIS VISIT:  Discharge Medication List as of 01/10/2016 12:21 PM    START taking these medications   Details  HYDROcodone-acetaminophen (NORCO/VICODIN) 5-325 MG tablet Take 2 tablets by mouth every 6 (six) hours as needed for moderate pain., Starting 01/10/2016, Until Mon 01/09/17, Print         Note:  This document was prepared using Dragon voice recognition software and may include unintentional dictation errors.    Nena Polio, MD 01/10/16 [phone]

## 2016-01-11 DIAGNOSIS — M1711 Unilateral primary osteoarthritis, right knee: Secondary | ICD-10-CM | POA: Diagnosis not present

## 2016-02-05 DIAGNOSIS — Z794 Long term (current) use of insulin: Secondary | ICD-10-CM | POA: Diagnosis not present

## 2016-02-05 DIAGNOSIS — E1165 Type 2 diabetes mellitus with hyperglycemia: Secondary | ICD-10-CM | POA: Diagnosis not present

## 2016-02-09 DIAGNOSIS — Z23 Encounter for immunization: Secondary | ICD-10-CM | POA: Diagnosis not present

## 2016-02-09 DIAGNOSIS — L03113 Cellulitis of right upper limb: Secondary | ICD-10-CM | POA: Diagnosis not present

## 2016-02-12 DIAGNOSIS — E1165 Type 2 diabetes mellitus with hyperglycemia: Secondary | ICD-10-CM | POA: Diagnosis not present

## 2016-02-12 DIAGNOSIS — E1142 Type 2 diabetes mellitus with diabetic polyneuropathy: Secondary | ICD-10-CM | POA: Diagnosis not present

## 2016-02-12 DIAGNOSIS — N183 Chronic kidney disease, stage 3 (moderate): Secondary | ICD-10-CM | POA: Diagnosis not present

## 2016-02-12 DIAGNOSIS — E1122 Type 2 diabetes mellitus with diabetic chronic kidney disease: Secondary | ICD-10-CM | POA: Diagnosis not present

## 2016-02-12 DIAGNOSIS — Z794 Long term (current) use of insulin: Secondary | ICD-10-CM | POA: Diagnosis not present

## 2016-02-25 DIAGNOSIS — M1711 Unilateral primary osteoarthritis, right knee: Secondary | ICD-10-CM | POA: Diagnosis not present

## 2016-02-25 DIAGNOSIS — G8929 Other chronic pain: Secondary | ICD-10-CM | POA: Diagnosis not present

## 2016-02-25 DIAGNOSIS — M25561 Pain in right knee: Secondary | ICD-10-CM | POA: Diagnosis not present

## 2016-02-29 DIAGNOSIS — N183 Chronic kidney disease, stage 3 (moderate): Secondary | ICD-10-CM | POA: Diagnosis not present

## 2016-02-29 DIAGNOSIS — E1122 Type 2 diabetes mellitus with diabetic chronic kidney disease: Secondary | ICD-10-CM | POA: Diagnosis not present

## 2016-02-29 DIAGNOSIS — E1165 Type 2 diabetes mellitus with hyperglycemia: Secondary | ICD-10-CM | POA: Diagnosis not present

## 2016-02-29 DIAGNOSIS — Z794 Long term (current) use of insulin: Secondary | ICD-10-CM | POA: Diagnosis not present

## 2016-02-29 DIAGNOSIS — E1142 Type 2 diabetes mellitus with diabetic polyneuropathy: Secondary | ICD-10-CM | POA: Diagnosis not present

## 2016-03-02 ENCOUNTER — Inpatient Hospital Stay: Admission: RE | Admit: 2016-03-02 | Payer: Self-pay | Source: Ambulatory Visit

## 2016-03-16 ENCOUNTER — Inpatient Hospital Stay: Admit: 2016-03-16 | Payer: Self-pay | Admitting: Orthopedic Surgery

## 2016-03-16 SURGERY — ARTHROPLASTY, KNEE, TOTAL, USING IMAGELESS COMPUTER-ASSISTED NAVIGATION
Anesthesia: Choice | Laterality: Right

## 2016-04-21 DIAGNOSIS — E1169 Type 2 diabetes mellitus with other specified complication: Secondary | ICD-10-CM | POA: Diagnosis not present

## 2016-04-21 DIAGNOSIS — E785 Hyperlipidemia, unspecified: Secondary | ICD-10-CM | POA: Diagnosis not present

## 2016-04-21 DIAGNOSIS — E1122 Type 2 diabetes mellitus with diabetic chronic kidney disease: Secondary | ICD-10-CM | POA: Diagnosis not present

## 2016-04-21 DIAGNOSIS — N183 Chronic kidney disease, stage 3 (moderate): Secondary | ICD-10-CM | POA: Diagnosis not present

## 2016-04-28 DIAGNOSIS — E1169 Type 2 diabetes mellitus with other specified complication: Secondary | ICD-10-CM | POA: Diagnosis not present

## 2016-04-28 DIAGNOSIS — E785 Hyperlipidemia, unspecified: Secondary | ICD-10-CM | POA: Diagnosis not present

## 2016-04-28 DIAGNOSIS — E1122 Type 2 diabetes mellitus with diabetic chronic kidney disease: Secondary | ICD-10-CM | POA: Diagnosis not present

## 2016-04-28 DIAGNOSIS — I10 Essential (primary) hypertension: Secondary | ICD-10-CM | POA: Diagnosis not present

## 2016-04-28 DIAGNOSIS — Z0000 Encounter for general adult medical examination without abnormal findings: Secondary | ICD-10-CM | POA: Diagnosis not present

## 2016-04-28 DIAGNOSIS — N183 Chronic kidney disease, stage 3 (moderate): Secondary | ICD-10-CM | POA: Diagnosis not present

## 2016-04-28 DIAGNOSIS — F325 Major depressive disorder, single episode, in full remission: Secondary | ICD-10-CM | POA: Diagnosis not present

## 2016-04-28 DIAGNOSIS — G4733 Obstructive sleep apnea (adult) (pediatric): Secondary | ICD-10-CM | POA: Diagnosis not present

## 2016-05-09 DIAGNOSIS — N183 Chronic kidney disease, stage 3 (moderate): Secondary | ICD-10-CM | POA: Diagnosis not present

## 2016-05-09 DIAGNOSIS — Z794 Long term (current) use of insulin: Secondary | ICD-10-CM | POA: Diagnosis not present

## 2016-05-09 DIAGNOSIS — E1142 Type 2 diabetes mellitus with diabetic polyneuropathy: Secondary | ICD-10-CM | POA: Diagnosis not present

## 2016-05-09 DIAGNOSIS — E1122 Type 2 diabetes mellitus with diabetic chronic kidney disease: Secondary | ICD-10-CM | POA: Diagnosis not present

## 2016-05-09 DIAGNOSIS — E1165 Type 2 diabetes mellitus with hyperglycemia: Secondary | ICD-10-CM | POA: Diagnosis not present

## 2016-05-30 ENCOUNTER — Emergency Department: Payer: Commercial Managed Care - HMO

## 2016-05-30 ENCOUNTER — Observation Stay: Payer: Commercial Managed Care - HMO

## 2016-05-30 ENCOUNTER — Other Ambulatory Visit: Payer: Self-pay

## 2016-05-30 ENCOUNTER — Observation Stay
Admission: EM | Admit: 2016-05-30 | Discharge: 2016-06-01 | Disposition: A | Discharge status: Home / Self Care | Payer: Commercial Managed Care - HMO | Attending: Internal Medicine | Admitting: Internal Medicine

## 2016-05-30 DIAGNOSIS — S0990XA Unspecified injury of head, initial encounter: Secondary | ICD-10-CM | POA: Diagnosis not present

## 2016-05-30 DIAGNOSIS — M7989 Other specified soft tissue disorders: Secondary | ICD-10-CM | POA: Diagnosis not present

## 2016-05-30 DIAGNOSIS — N289 Disorder of kidney and ureter, unspecified: Secondary | ICD-10-CM

## 2016-05-30 DIAGNOSIS — Z888 Allergy status to other drugs, medicaments and biological substances status: Secondary | ICD-10-CM | POA: Diagnosis not present

## 2016-05-30 DIAGNOSIS — G9340 Encephalopathy, unspecified: Secondary | ICD-10-CM | POA: Diagnosis not present

## 2016-05-30 DIAGNOSIS — M6281 Muscle weakness (generalized): Secondary | ICD-10-CM

## 2016-05-30 DIAGNOSIS — N179 Acute kidney failure, unspecified: Secondary | ICD-10-CM | POA: Diagnosis not present

## 2016-05-30 DIAGNOSIS — N189 Chronic kidney disease, unspecified: Secondary | ICD-10-CM

## 2016-05-30 DIAGNOSIS — R4182 Altered mental status, unspecified: Secondary | ICD-10-CM | POA: Diagnosis not present

## 2016-05-30 DIAGNOSIS — Z880 Allergy status to penicillin: Secondary | ICD-10-CM | POA: Diagnosis not present

## 2016-05-30 DIAGNOSIS — E860 Dehydration: Principal | ICD-10-CM | POA: Insufficient documentation

## 2016-05-30 DIAGNOSIS — E1122 Type 2 diabetes mellitus with diabetic chronic kidney disease: Secondary | ICD-10-CM | POA: Insufficient documentation

## 2016-05-30 DIAGNOSIS — D649 Anemia, unspecified: Secondary | ICD-10-CM | POA: Diagnosis not present

## 2016-05-30 DIAGNOSIS — R609 Edema, unspecified: Secondary | ICD-10-CM | POA: Diagnosis not present

## 2016-05-30 DIAGNOSIS — F419 Anxiety disorder, unspecified: Secondary | ICD-10-CM | POA: Diagnosis not present

## 2016-05-30 DIAGNOSIS — Z79899 Other long term (current) drug therapy: Secondary | ICD-10-CM | POA: Insufficient documentation

## 2016-05-30 DIAGNOSIS — E7800 Pure hypercholesterolemia, unspecified: Secondary | ICD-10-CM | POA: Diagnosis not present

## 2016-05-30 DIAGNOSIS — M1711 Unilateral primary osteoarthritis, right knee: Secondary | ICD-10-CM | POA: Insufficient documentation

## 2016-05-30 DIAGNOSIS — Z7982 Long term (current) use of aspirin: Secondary | ICD-10-CM | POA: Insufficient documentation

## 2016-05-30 DIAGNOSIS — F329 Major depressive disorder, single episode, unspecified: Secondary | ICD-10-CM | POA: Insufficient documentation

## 2016-05-30 DIAGNOSIS — Z87892 Personal history of anaphylaxis: Secondary | ICD-10-CM | POA: Insufficient documentation

## 2016-05-30 DIAGNOSIS — Z794 Long term (current) use of insulin: Secondary | ICD-10-CM | POA: Insufficient documentation

## 2016-05-30 DIAGNOSIS — E1165 Type 2 diabetes mellitus with hyperglycemia: Secondary | ICD-10-CM | POA: Insufficient documentation

## 2016-05-30 DIAGNOSIS — R600 Localized edema: Secondary | ICD-10-CM | POA: Diagnosis not present

## 2016-05-30 DIAGNOSIS — S8991XA Unspecified injury of right lower leg, initial encounter: Secondary | ICD-10-CM | POA: Diagnosis not present

## 2016-05-30 DIAGNOSIS — I129 Hypertensive chronic kidney disease with stage 1 through stage 4 chronic kidney disease, or unspecified chronic kidney disease: Secondary | ICD-10-CM | POA: Diagnosis not present

## 2016-05-30 DIAGNOSIS — R531 Weakness: Secondary | ICD-10-CM | POA: Diagnosis not present

## 2016-05-30 DIAGNOSIS — R197 Diarrhea, unspecified: Secondary | ICD-10-CM | POA: Diagnosis present

## 2016-05-30 DIAGNOSIS — W19XXXA Unspecified fall, initial encounter: Secondary | ICD-10-CM | POA: Diagnosis not present

## 2016-05-30 DIAGNOSIS — M25561 Pain in right knee: Secondary | ICD-10-CM | POA: Diagnosis not present

## 2016-05-30 DIAGNOSIS — R42 Dizziness and giddiness: Secondary | ICD-10-CM | POA: Diagnosis not present

## 2016-05-30 HISTORY — DX: Sleep terrors (night terrors): F51.4

## 2016-05-30 HISTORY — DX: Chronic kidney disease, unspecified: N18.9

## 2016-05-30 LAB — GASTROINTESTINAL PANEL BY PCR, STOOL (REPLACES STOOL CULTURE)
Adenovirus F40/41: NOT DETECTED
Astrovirus: NOT DETECTED
CYCLOSPORA CAYETANENSIS: NOT DETECTED
Campylobacter species: NOT DETECTED
Cryptosporidium: NOT DETECTED
E. coli O157: NOT DETECTED
ENTAMOEBA HISTOLYTICA: NOT DETECTED
ENTEROAGGREGATIVE E COLI (EAEC): NOT DETECTED
Enteropathogenic E coli (EPEC): DETECTED — AB
Enterotoxigenic E coli (ETEC): NOT DETECTED
GIARDIA LAMBLIA: NOT DETECTED
NOROVIRUS GI/GII: NOT DETECTED
Plesimonas shigelloides: NOT DETECTED
Rotavirus A: NOT DETECTED
SALMONELLA SPECIES: NOT DETECTED
SAPOVIRUS (I, II, IV, AND V): NOT DETECTED
SHIGELLA/ENTEROINVASIVE E COLI (EIEC): NOT DETECTED
Shiga like toxin producing E coli (STEC): NOT DETECTED
VIBRIO CHOLERAE: NOT DETECTED
VIBRIO SPECIES: NOT DETECTED
YERSINIA ENTEROCOLITICA: NOT DETECTED

## 2016-05-30 LAB — BASIC METABOLIC PANEL
ANION GAP: 8 (ref 5–15)
BUN: 38 mg/dL — ABNORMAL HIGH (ref 6–20)
CALCIUM: 8.3 mg/dL — AB (ref 8.9–10.3)
CO2: 21 mmol/L — ABNORMAL LOW (ref 22–32)
Chloride: 109 mmol/L (ref 101–111)
Creatinine, Ser: 1.51 mg/dL — ABNORMAL HIGH (ref 0.61–1.24)
GFR, EST AFRICAN AMERICAN: 52 mL/min — AB (ref >60)
GFR, EST NON AFRICAN AMERICAN: 45 mL/min — AB (ref >60)
Glucose, Bld: 154 mg/dL — ABNORMAL HIGH (ref 65–99)
Potassium: 4.9 mmol/L (ref 3.5–5.1)
SODIUM: 138 mmol/L (ref 135–145)

## 2016-05-30 LAB — URINALYSIS COMPLETE WITH MICROSCOPIC (ARMC ONLY)
Bilirubin Urine: NEGATIVE
GLUCOSE, UA: NEGATIVE mg/dL
HGB URINE DIPSTICK: NEGATIVE
KETONES UR: NEGATIVE mg/dL
Leukocytes, UA: NEGATIVE
NITRITE: NEGATIVE
Protein, ur: NEGATIVE mg/dL
SPECIFIC GRAVITY, URINE: 1.013 (ref 1.005–1.030)
pH: 6 (ref 5.0–8.0)

## 2016-05-30 LAB — CBC
HCT: 37.5 % — ABNORMAL LOW (ref 40.0–52.0)
HCT: 38 % — ABNORMAL LOW (ref 40.0–52.0)
HEMOGLOBIN: 12.7 g/dL — AB (ref 13.0–18.0)
Hemoglobin: 12.7 g/dL — ABNORMAL LOW (ref 13.0–18.0)
MCH: 31.7 pg (ref 26.0–34.0)
MCH: 31.7 pg (ref 26.0–34.0)
MCHC: 34 g/dL (ref 32.0–36.0)
MCHC: 33.4 g/dL (ref 32.0–36.0)
MCV: 93.2 fL (ref 80.0–100.0)
MCV: 95 fL (ref 80.0–100.0)
PLATELETS: 192 10*3/uL (ref 150–440)
PLATELETS: 179 10*3/uL (ref 150–440)
RBC: 4.02 MIL/uL — AB (ref 4.40–5.90)
RBC: 4 MIL/uL — AB (ref 4.40–5.90)
RDW: 15.1 % — ABNORMAL HIGH (ref 11.5–14.5)
RDW: 15.1 % — ABNORMAL HIGH (ref 11.5–14.5)
WBC: 10.3 10*3/uL (ref 3.8–10.6)
WBC: 8.8 10*3/uL (ref 3.8–10.6)

## 2016-05-30 LAB — GLUCOSE, CAPILLARY
GLUCOSE-CAPILLARY: 128 mg/dL — AB (ref 65–99)
Glucose-Capillary: 154 mg/dL — ABNORMAL HIGH (ref 65–99)

## 2016-05-30 LAB — C DIFFICILE QUICK SCREEN W PCR REFLEX
C Diff antigen: NEGATIVE
C Diff interpretation: NOT DETECTED
C Diff toxin: NEGATIVE

## 2016-05-30 LAB — MAGNESIUM
MAGNESIUM: 1.8 mg/dL (ref 1.7–2.4)
MAGNESIUM: 1.8 mg/dL (ref 1.7–2.4)

## 2016-05-30 LAB — CREATININE, SERUM
CREATININE: 1.29 mg/dL — AB (ref 0.61–1.24)
GFR, EST NON AFRICAN AMERICAN: 55 mL/min — AB (ref >60)

## 2016-05-30 LAB — TROPONIN I

## 2016-05-30 MED ORDER — INSULIN ASPART 100 UNIT/ML ~~LOC~~ SOLN
3.0000 [IU] | Freq: Three times a day (TID) | SUBCUTANEOUS | Status: DC
  Administered 2016-05-30 – 2016-06-01 (×5): 3 [IU] via SUBCUTANEOUS
  Filled 2016-05-30 (×5): qty 3

## 2016-05-30 MED ORDER — SODIUM CHLORIDE 0.9% FLUSH
3.0000 mL | Freq: Two times a day (BID) | INTRAVENOUS | Status: DC
  Administered 2016-05-30 – 2016-05-31 (×2): 3 mL via INTRAVENOUS

## 2016-05-30 MED ORDER — GABAPENTIN 800 MG PO TABS
800.0000 mg | ORAL_TABLET | Freq: Two times a day (BID) | ORAL | Status: DC
  Filled 2016-05-30: qty 1

## 2016-05-30 MED ORDER — GABAPENTIN 400 MG PO CAPS
800.0000 mg | ORAL_CAPSULE | Freq: Two times a day (BID) | ORAL | Status: DC
  Administered 2016-05-30 – 2016-06-01 (×4): 800 mg via ORAL
  Filled 2016-05-30 (×5): qty 2

## 2016-05-30 MED ORDER — PAROXETINE HCL 20 MG PO TABS
40.0000 mg | ORAL_TABLET | Freq: Every day | ORAL | Status: DC
  Administered 2016-05-30 – 2016-05-31 (×2): 40 mg via ORAL
  Filled 2016-05-30 (×2): qty 2

## 2016-05-30 MED ORDER — ACETAMINOPHEN 650 MG RE SUPP: 650.0000 mg | Freq: Four times a day (QID) | RECTAL | Status: DC | PRN

## 2016-05-30 MED ORDER — HYDROCHLOROTHIAZIDE 12.5 MG PO CAPS
12.5000 mg | ORAL_CAPSULE | Freq: Every day | ORAL | Status: DC
  Administered 2016-05-30 – 2016-06-01 (×2): 12.5 mg via ORAL
  Filled 2016-05-30 (×2): qty 1

## 2016-05-30 MED ORDER — INSULIN ASPART 100 UNIT/ML ~~LOC~~ SOLN: 0.0000 [IU] | Freq: Every day | SUBCUTANEOUS | Status: DC

## 2016-05-30 MED ORDER — ACETAMINOPHEN 325 MG PO TABS
650.0000 mg | ORAL_TABLET | Freq: Four times a day (QID) | ORAL | Status: DC | PRN
  Administered 2016-05-30: 17:00:00 650 mg via ORAL
  Filled 2016-05-30: qty 2

## 2016-05-30 MED ORDER — SODIUM CHLORIDE 0.9 % IV SOLN
INTRAVENOUS | Status: DC
  Administered 2016-05-30 – 2016-06-01 (×5): via INTRAVENOUS

## 2016-05-30 MED ORDER — LORAZEPAM 0.5 MG PO TABS: 0.5000 mg | ORAL_TABLET | Freq: Two times a day (BID) | ORAL | Status: DC | PRN

## 2016-05-30 MED ORDER — LISINOPRIL 20 MG PO TABS
20.0000 mg | ORAL_TABLET | Freq: Every day | ORAL | Status: DC
  Administered 2016-05-30 – 2016-06-01 (×2): 20 mg via ORAL
  Filled 2016-05-30 (×2): qty 1

## 2016-05-30 MED ORDER — ENOXAPARIN SODIUM 40 MG/0.4ML ~~LOC~~ SOLN
40.0000 mg | SUBCUTANEOUS | Status: DC
  Administered 2016-05-30 – 2016-05-31 (×2): 40 mg via SUBCUTANEOUS
  Filled 2016-05-30 (×2): qty 0.4

## 2016-05-30 MED ORDER — SODIUM CHLORIDE 0.9 % IV BOLUS (SEPSIS)
1000.0000 mL | Freq: Once | INTRAVENOUS | Status: AC
  Administered 2016-05-30: 1000 mL via INTRAVENOUS

## 2016-05-30 MED ORDER — TOPIRAMATE 25 MG PO TABS
100.0000 mg | ORAL_TABLET | Freq: Two times a day (BID) | ORAL | Status: DC
  Administered 2016-05-30 – 2016-06-01 (×4): 100 mg via ORAL
  Filled 2016-05-30 (×4): qty 4

## 2016-05-30 MED ORDER — CLONAZEPAM 0.5 MG PO TABS
0.5000 mg | ORAL_TABLET | Freq: Every day | ORAL | Status: DC
  Administered 2016-05-30 – 2016-05-31 (×2): 0.5 mg via ORAL
  Filled 2016-05-30 (×2): qty 1

## 2016-05-30 MED ORDER — INSULIN ASPART 100 UNIT/ML ~~LOC~~ SOLN
0.0000 [IU] | Freq: Three times a day (TID) | SUBCUTANEOUS | Status: DC
  Administered 2016-05-30 – 2016-05-31 (×3): 1 [IU] via SUBCUTANEOUS
  Filled 2016-05-30 (×3): qty 1

## 2016-05-30 MED ORDER — ATORVASTATIN CALCIUM 20 MG PO TABS
80.0000 mg | ORAL_TABLET | Freq: Every day | ORAL | Status: DC
  Administered 2016-05-30 – 2016-05-31 (×2): 80 mg via ORAL
  Filled 2016-05-30 (×2): qty 4

## 2016-05-30 MED ORDER — PANTOPRAZOLE SODIUM 40 MG PO TBEC
40.0000 mg | DELAYED_RELEASE_TABLET | Freq: Every day | ORAL | Status: DC
  Administered 2016-05-31 – 2016-06-01 (×2): 40 mg via ORAL
  Filled 2016-05-30 (×2): qty 1

## 2016-05-30 MED ORDER — INSULIN DETEMIR 100 UNIT/ML ~~LOC~~ SOLN
22.0000 [IU] | Freq: Every day | SUBCUTANEOUS | Status: DC
  Administered 2016-05-30 – 2016-05-31 (×2): 22 [IU] via SUBCUTANEOUS
  Filled 2016-05-30 (×4): qty 0.22

## 2016-05-30 MED ORDER — LISINOPRIL-HYDROCHLOROTHIAZIDE 20-12.5 MG PO TABS: 1.0000 | ORAL_TABLET | Freq: Every day | ORAL | Status: DC

## 2016-05-30 MED ORDER — ASPIRIN EC 325 MG PO TBEC
325.0000 mg | DELAYED_RELEASE_TABLET | Freq: Every day | ORAL | Status: DC
  Administered 2016-05-31 – 2016-06-01 (×2): 325 mg via ORAL
  Filled 2016-05-30 (×2): qty 1

## 2016-05-30 NOTE — ED Notes (Signed)
Pt ambulated to and from bathroom with standby assist.

## 2016-05-30 NOTE — ED Notes (Signed)
Pt ambulates to and from bathroom with standby assist.

## 2016-05-30 NOTE — ED Notes (Signed)
MD at bedside. 

## 2016-05-30 NOTE — ED Notes (Signed)
Report given to Jenna, RN

## 2016-05-30 NOTE — ED Notes (Signed)
Report to Olivia Mackie, RN on 1C.

## 2016-05-30 NOTE — ED Notes (Signed)
Stool sample collected, lab sending orange top tube for collectin of sample.

## 2016-05-30 NOTE — ED Notes (Addendum)
Pt to ultrasound

## 2016-05-30 NOTE — ED Provider Notes (Addendum)
[facility] Emergency Department Provider Note  Time seen: 10:08 AM  I have reviewed the triage vital signs and the nursing notes.   HISTORY  Chief Complaint Fall and Dizziness    HPI Patrick Turner is a 70 y.o. male with a past medical history of anxiety, depression, diabetes, hypertension, hyperlipidemia who presents to the emergency department after a fall with dizziness. According to the patient he was up all last night with diarrhea. States he went to return a rental car today after returning the car he states he stood up felt extremely weak and fell to his knees. Patient has right knee pain. Patient denies hitting his head. Denies LOC. Patient appears very weak and sluggish. Denies any nausea or vomiting. Denies any chest pain or shortness of breath. Denies any abdominal pain.  Past Medical History:  Diagnosis Date  . Anxiety   . Depression   . Diabetes mellitus without complication ([place])   . Hypercholesteremia   . Hypertension     Patient Active Problem List   Diagnosis Date Noted  . Unspecified transient cerebral ischemia 05/29/2012    Past Surgical History:  Procedure Laterality Date  . APPENDECTOMY    . TONSILLECTOMY      Prior to Admission medications   Medication Sig Start Date End Date Taking? Authorizing Provider  ACCU-CHEK SOFTCLIX LANCETS lancets 1 each 3 (three) times daily. 10/05/15   Historical Provider, MD  aspirin EC 325 MG tablet Take 325 mg by mouth daily.    Historical Provider, MD  atorvastatin (LIPITOR) 80 MG tablet Take 80 mg by mouth at bedtime. 12/14/15   Historical Provider, MD  clonazePAM (KLONOPIN) 1 MG tablet Take 0.5 mg by mouth at bedtime. 12/18/15   Historical Provider, MD  gabapentin (NEURONTIN) 800 MG tablet Take 800 mg by mouth 2 (two) times daily.     Historical Provider, MD  HYDROcodone-acetaminophen (NORCO/VICODIN) 5-325 MG tablet Take 2 tablets by mouth every 6 (six) hours as needed for moderate pain. 01/10/16  01/09/17  Nena Polio, MD  LEVEMIR 100 UNIT/ML injection Inject 18 Units into the skin at bedtime. 01/03/16   Historical Provider, MD  lisinopril-hydrochlorothiazide (PRINZIDE,ZESTORETIC) 20-12.5 MG per tablet Take 1 tablet by mouth daily.     Historical Provider, MD  LORazepam (ATIVAN) 0.5 MG tablet Take 0.5 mg by mouth 2 (two) times daily as needed. For anxiety    Historical Provider, MD  metFORMIN (GLUCOPHAGE-XR) 500 MG 24 hr tablet Take 1,000 mg by mouth 2 (two) times daily. 12/23/15   Historical Provider, MD  omeprazole (PRILOSEC) 40 MG capsule Take 40 mg by mouth every morning.    Historical Provider, MD  PARoxetine (PAXIL) 40 MG tablet Take 40 mg by mouth at bedtime.    Historical Provider, MD  pioglitazone (ACTOS) 15 MG tablet Take 15 mg by mouth daily. 11/24/15   Historical Provider, MD  QUEtiapine (SEROQUEL) 100 MG tablet Take 100 mg by mouth at bedtime. 12/22/15   Historical Provider, MD  topiramate (TOPAMAX) 100 MG tablet Take 100 mg by mouth 2 (two) times daily.    Historical Provider, MD    Allergies  Allergen Reactions  . Levemir [Insulin Detemir] Other (See Comments)    Patient states that the generic gave him night terrors.  . Penicillins Anaphylaxis    Has patient had a PCN reaction causing immediate rash, facial/tongue/throat swelling, SOB or lightheadedness with hypotension: Yes Has patient had a PCN reaction causing severe rash involving mucus membranes or skin  necrosis: No Has patient had a PCN reaction that required hospitalization Yes Has patient had a PCN reaction occurring within the last 10 years: No If all of the above answers are "NO", then may proceed with Cephalosporin use.  Simona Huh [Clemastine] Swelling    No family history on file.  Social History Social History  Substance Use Topics  . Smoking status: Never Smoker  . Smokeless tobacco: Never Used  . Alcohol use No    Review of Systems Constitutional: Negative for fever. Cardiovascular: Negative  for chest pain. Respiratory: Negative for shortness of breath. Gastrointestinal: Negative for abdominal pain. Positive for diarrhea last night, none today. Genitourinary: Negative for dysuria. Musculoskeletal: Right knee pain. Neurological: Negative for headache. Denies focal weakness or numbness, but states generalized weakness. 10-point ROS otherwise negative.  ____________________________________________   PHYSICAL EXAM:  VITAL SIGNS: ED Triage Vitals [05/30/16 0948]  Enc Vitals Group     BP (!) 102/54     Pulse Rate 83     Resp 14     Temp 97.8 F (36.6 C)     Temp Source Oral     SpO2 96 %     Weight 230 lb (104.3 kg)     Height 5\' 5"  (1.651 m)     Head Circumference      Peak Flow      Pain Score 2     Pain Loc      Pain Edu?      Excl. in [place]?     Constitutional: Alert and oriented. Well appearing and in no distress.Somewhat sluggish with his responses. Eyes: Normal exam, [facility]   Head: Normocephalic and atraumatic.   Mouth/Throat: Dry mucous membranes Cardiovascular: Normal rate, regular rhythm.  Respiratory: Normal respiratory effort without tachypnea nor retractions. Breath sounds are clear Gastrointestinal: Soft and nontender. No distention.   Musculoskeletal: Good range of motion in external ears, abrasion to right knee. Neurologic:  Normal speech and language. No gross focal neurologic deficits  Skin:  Skin is warm, dry and intact.  Psychiatric: Mood and affect are normal.   ____________________________________________    EKG  EKG reviewed and interpreted, so shows normal sinus rhythm at 80 bpm, narrow QRS, normal axis, normal intervals, no ST changes, overall normal EKG.  ____________________________________________    RADIOLOGY  CT head negative  ____________________________________________   INITIAL IMPRESSION / ASSESSMENT AND PLAN / ED COURSE  Pertinent labs & imaging results that were available during my care of the patient  were reviewed by me and considered in my medical decision making (see chart for details).  Patient presents emergency department generalized weakness after a fall. We'll obtain an x-ray of the right knee. Patient was up all last night with diarrhea now generalized weakness suspected degree of dehydration leading to the patient's symptoms. Given his sluggish responses we will obtain a CT scan of the head, labs, IV hydrate and close and monitor in the emergency department. The patient is agreeable to plan.  Labs are within normal limits. Urinalysis negative. Patient continues to feel extremely lethargic, daughter states he is acting very sluggish compared to his baseline. Patient has had 3 loose bowel movements since arrival to the emergency department. We will treat with loperamide, obtain a stool culture, place on enteric precautions and admitted to the hospital.  ____________________________________________   FINAL CLINICAL IMPRESSION(S) / ED DIAGNOSES  Generalized weakness Contusion AMS   Harvest Dark, MD 05/30/16 [place], MD 05/30/16 1339

## 2016-05-30 NOTE — H&P (Addendum)
[place] [facility] [place] NAME: Patrick Turner    MR#:  [id number]  DATE OF BIRTH:  Jan 30, 1946  DATE OF ADMISSION:  05/30/2016  PRIMARY CARE PHYSICIAN: Kirk Ruths., MD   REQUESTING/REFERRING PHYSICIAN:   CHIEF COMPLAINT:   Chief Complaint  Patient presents with  . Fall  . Dizziness    HISTORY OF PRESENT ILLNESS: Patrick Turner  is a 70 y.o. male with a known history of Anxiety, depression, diabetes mellitus, on insulin therapy, hyperlipidemia, hypertension, who presents to the hospital with complaints of explosive diarrhea which started at 2:30 AM on the day of admission. The patient complains of achy lower abdominal pain, constant, increasing before defecation. No sick contacts, although patient had [employer] reunion in his house yesterday for which he prepared bratwurst , sauerkraut in his own home, he did not contact his guests to find out if they were also sick with the same. Patient complains of feeling chilly fatigue and weak, he fell down earlier today, felt presyncopal. Because of explosive diarrhea, incontinence. He decided to come to emergency room for further evaluation and treatment. In emergency room, his labs revealed mild renal insufficiency, slightly worse than in the past, mild hyperglycemia, anemia. Hospitalist services were contacted for admission. As patient was relatively hypotensive on arrival.  PAST MEDICAL HISTORY:   Past Medical History:  Diagnosis Date  . Anxiety   . Depression   . Diabetes mellitus without complication ([place])   . Hypercholesteremia   . Hypertension     PAST SURGICAL HISTORY: Past Surgical History:  Procedure Laterality Date  . APPENDECTOMY    . TONSILLECTOMY      SOCIAL HISTORY:  Social History  Substance Use Topics  . Smoking status: Never Smoker  . Smokeless tobacco: Never Used  . Alcohol use No    FAMILY HISTORY: No family history on file.  DRUG ALLERGIES:  Allergies   Allergen Reactions  . Levemir [Insulin Detemir] Other (See Comments)    Patient states that the generic gave him night terrors.  . Penicillins Anaphylaxis    Has patient had a PCN reaction causing immediate rash, facial/tongue/throat swelling, SOB or lightheadedness with hypotension: Yes Has patient had a PCN reaction causing severe rash involving mucus membranes or skin necrosis: No Has patient had a PCN reaction that required hospitalization Yes Has patient had a PCN reaction occurring within the last 10 years: No If all of the above answers are "NO", then may proceed with Cephalosporin use.  Simona Huh [Clemastine] Swelling    Review of Systems  Constitutional: Positive for chills and weight loss. Negative for fever.  HENT: Negative for congestion.   Eyes: Negative for blurred vision and double vision.  Respiratory: Positive for cough. Negative for sputum production, shortness of breath and wheezing.   Cardiovascular: Negative for chest pain, palpitations, orthopnea, leg swelling and PND.  Gastrointestinal: Positive for abdominal pain and diarrhea. Negative for blood in stool, constipation, nausea and vomiting.  Genitourinary: Negative for dysuria, frequency, hematuria and urgency.  Musculoskeletal: Negative for falls.  Neurological: Positive for weakness. Negative for dizziness, tremors, focal weakness and headaches.  Endo/Heme/Allergies: Does not bruise/bleed easily.  Psychiatric/Behavioral: Negative for depression. The patient does not have insomnia.     MEDICATIONS AT HOME:  Prior to Admission medications   Medication Sig Start Date End Date Taking? Authorizing Provider  aspirin EC 325 MG tablet Take 325 mg by mouth daily.   Yes Historical Provider, MD  atorvastatin (LIPITOR) 80  MG tablet Take 80 mg by mouth at bedtime. 12/14/15  Yes Historical Provider, MD  clonazePAM (KLONOPIN) 1 MG tablet Take 0.5 mg by mouth at bedtime. 12/18/15  Yes Historical Provider, MD  gabapentin  (NEURONTIN) 800 MG tablet Take 800 mg by mouth 2 (two) times daily.    Yes Historical Provider, MD  LEVEMIR 100 UNIT/ML injection Inject 22 Units into the skin at bedtime.  01/03/16  Yes Historical Provider, MD  lisinopril-hydrochlorothiazide (PRINZIDE,ZESTORETIC) 20-12.5 MG per tablet Take 1 tablet by mouth daily.    Yes Historical Provider, MD  metFORMIN (GLUCOPHAGE-XR) 500 MG 24 hr tablet Take 1,000 mg by mouth 2 (two) times daily. 12/23/15  Yes Historical Provider, MD  omeprazole (PRILOSEC) 40 MG capsule Take 40 mg by mouth every morning.   Yes Historical Provider, MD  PARoxetine (PAXIL) 40 MG tablet Take 40 mg by mouth at bedtime.   Yes Historical Provider, MD  pioglitazone (ACTOS) 15 MG tablet Take 15 mg by mouth daily. 11/24/15  Yes Historical Provider, MD  QUEtiapine (SEROQUEL) 100 MG tablet Take 100 mg by mouth at bedtime. 12/22/15  Yes Historical Provider, MD  topiramate (TOPAMAX) 100 MG tablet Take 100 mg by mouth 2 (two) times daily.   Yes Historical Provider, MD  LORazepam (ATIVAN) 0.5 MG tablet Take 0.5 mg by mouth 2 (two) times daily as needed. For anxiety    Historical Provider, MD      PHYSICAL EXAMINATION:   VITAL SIGNS: Blood pressure (!) 143/63, pulse 78, temperature 97.8 F (36.6 C), temperature source Oral, resp. rate 18, height 5\' 5"  (1.651 m), weight 104.3 kg (230 lb), SpO2 97 %.  GENERAL:  70 y.o.-year-old patient lying in the bed In mild distress, dry oral mucosa, somewhat somnolent. Some slurring of the speech.  EYES: Pupils equal, round, reactive to light and accommodation. No scleral icterus. Extraocular muscles intact.  HEENT: Head atraumatic, normocephalic. Oropharynx and nasopharynx clear.  NECK:  Supple, no jugular venous distention. No thyroid enlargement, no tenderness.  LUNGS: Normal breath sounds bilaterally, no wheezing, rales,rhonchi or crepitation. No use of accessory muscles of respiration.  CARDIOVASCULAR: S1, S2 normal. No murmurs, rubs, or gallops.   ABDOMEN: Soft, nontender, nondistended. Bowel sounds present. No organomegaly or mass.  EXTREMITIES: 2+ lower extremity and pedal edema, no cyanosis, or clubbing. Bilateral calf tenderness on palpation NEUROLOGIC: Cranial nerves II through XII are intact. Muscle strength 5/5 in all extremities. Sensation intact. Gait not checked.  PSYCHIATRIC: The patient is some somnolent, however oriented x 3, slow to respond.  SKIN: No obvious rash, lesion, or ulcer.   LABORATORY PANEL:   CBC  Recent Labs Lab 05/30/16 0953  WBC 10.3  HGB 12.7*  HCT 37.5*  PLT 192  MCV 93.2  MCH 31.7  MCHC 34.0  RDW 15.1*   ------------------------------------------------------------------------------------------------------------------  Chemistries   Recent Labs Lab 05/30/16 0953  NA 138  K 4.9  CL 109  CO2 21*  GLUCOSE 154*  BUN 38*  CREATININE 1.51*  CALCIUM 8.3*   ------------------------------------------------------------------------------------------------------------------  Cardiac Enzymes  Recent Labs Lab 05/30/16 0953  TROPONINI <0.03   ------------------------------------------------------------------------------------------------------------------  RADIOLOGY: Ct Head Wo Contrast  Result Date: 05/30/2016 CLINICAL DATA:  Dizziness, fall.  No loss of consciousness. EXAM: CT HEAD WITHOUT CONTRAST TECHNIQUE: Contiguous axial images were obtained from the base of the skull through the vertex without intravenous contrast. COMPARISON:  CT scan of November 05, 2015. FINDINGS: Bony calvarium is unremarkable. Visualized paranasal sinuses appear normal. No mass effect or midline shift  is noted. Ventricular size is within normal limits. There is no evidence of mass lesion, hemorrhage or acute infarction. IMPRESSION: Normal head CT. Electronically Signed   By: Marijo Conception, M.D.   On: 05/30/2016 10:30   Dg Knee Complete 4 Views Right  Result Date: 05/30/2016 CLINICAL DATA:  Status post  fall.  Right anterior knee pain. EXAM: RIGHT KNEE - COMPLETE 4+ VIEW COMPARISON:  None. FINDINGS: No acute fracture dislocation. No lytic or sclerotic osseous lesion. Tricompartmental osteoarthritis of the right knee most severe in the medial femorotibial compartment and patellofemoral compartment. No significant joint effusion. IMPRESSION: No acute osseous injury of the right knee. Tricompartmental osteoarthritis of the right knee. Electronically Signed   By: Kathreen Devoid   On: 05/30/2016 10:53    EKG: Orders placed or performed during the hospital encounter of 05/30/16  . ED EKG  . ED EKG  EKG in the emergency room revealed normal sinus rhythm at 80 bpm, normal axis, no acute ST-T changes  IMPRESSION AND PLAN:  Principal Problem:   Acute diarrhea Active Problems:   Acute encephalopathy   Anemia   Acute on chronic renal insufficiency (HCC)   Diarrhea #1. Acute diarrhea, admit patient to medical floor, continue precautions, get stool cultures for anterior pathogens, C. difficile, normal virus, supportive therapy, IV fluids, full liquid diet, advance diet as tolerated #2. Acute encephalopathy, supportive therapy, head CT was unremarkable, follow clinically, neuro checks intermittently #3. Acute on chronic renal insufficiency, rehydrate, follow creatinine in the morning, urinalysis was unremarkable, unlikely UTI #4. Lower extremity swelling, get Doppler ultrasound to rule out DVT. Follow with rehydration #5. Diabetes mellitus type 2, hold metformin due to renal insufficiency, resume all other medications, sliding scale insulin, get hemoglobin A1c  #6. Generalized weakness, get physical therapist involved. Right knee x-ray showed post arthritis of right knee, no fractures.   All the records are reviewed and case discussed with ED provider. Management plans discussed with the patient, family and they are in agreement.  CODE STATUS: Code Status History    This patient does not have a  recorded code status. Please follow your organizational policy for patients in this situation.       TOTAL TIME TAKING CARE OF THIS PATIENT: 50 minutes.  Discussed with patient's daughter-in-law  Theodoro Grist M.D on 05/30/2016 at 2:14 PM  Between 7am to 6pm - Pager - [phone] After 6pm go to [email or web address] - password [email or web address] [facility]  Office  [phone]  CC: Primary care physician; Kirk Ruths., MD

## 2016-05-30 NOTE — ED Notes (Signed)
Patient transported to CT 

## 2016-05-30 NOTE — ED Triage Notes (Addendum)
Pt arrives to ER via [facility] from the place where he fell today. Pt denies LOC but states that he has been very dizzy since awakening today at 0645. Pt fell and is c/o right knee pain, abrasion to right knee is the only injury present. Pt diabetic, CBG 170 per [facility]. 20 G to R arm established by [facility], NS infusing upon arrival. Pt alert and oriented 4 but is slow to answer questions. Speech clear, but slow.

## 2016-05-31 DIAGNOSIS — R600 Localized edema: Secondary | ICD-10-CM | POA: Diagnosis not present

## 2016-05-31 DIAGNOSIS — R197 Diarrhea, unspecified: Secondary | ICD-10-CM | POA: Diagnosis not present

## 2016-05-31 DIAGNOSIS — G9340 Encephalopathy, unspecified: Secondary | ICD-10-CM | POA: Diagnosis not present

## 2016-05-31 DIAGNOSIS — N289 Disorder of kidney and ureter, unspecified: Secondary | ICD-10-CM | POA: Diagnosis not present

## 2016-05-31 LAB — CBC
HCT: 33.9 % — ABNORMAL LOW (ref 40.0–52.0)
Hemoglobin: 11.5 g/dL — ABNORMAL LOW (ref 13.0–18.0)
MCH: 31.8 pg (ref 26.0–34.0)
MCHC: 33.8 g/dL (ref 32.0–36.0)
MCV: 94.2 fL (ref 80.0–100.0)
PLATELETS: 158 10*3/uL (ref 150–440)
RBC: 3.6 MIL/uL — ABNORMAL LOW (ref 4.40–5.90)
RDW: 15.1 % — ABNORMAL HIGH (ref 11.5–14.5)
WBC: 6 10*3/uL (ref 3.8–10.6)

## 2016-05-31 LAB — BASIC METABOLIC PANEL
ANION GAP: 4 — AB (ref 5–15)
BUN: 30 mg/dL — ABNORMAL HIGH (ref 6–20)
CHLORIDE: 116 mmol/L — AB (ref 101–111)
CO2: 20 mmol/L — AB (ref 22–32)
Calcium: 7.9 mg/dL — ABNORMAL LOW (ref 8.9–10.3)
Creatinine, Ser: 1.27 mg/dL — ABNORMAL HIGH (ref 0.61–1.24)
GFR calc non Af Amer: 56 mL/min — ABNORMAL LOW (ref >60)
GLUCOSE: 99 mg/dL (ref 65–99)
POTASSIUM: 3.6 mmol/L (ref 3.5–5.1)
Sodium: 140 mmol/L (ref 135–145)

## 2016-05-31 LAB — HEMOGLOBIN A1C
Hgb A1c MFr Bld: 7.8 % — ABNORMAL HIGH (ref 4.8–5.6)
Mean Plasma Glucose: 177 mg/dL

## 2016-05-31 LAB — GLUCOSE, CAPILLARY
GLUCOSE-CAPILLARY: 82 mg/dL (ref 65–99)
Glucose-Capillary: 122 mg/dL — ABNORMAL HIGH (ref 65–99)
Glucose-Capillary: 109 mg/dL — ABNORMAL HIGH (ref 65–99)

## 2016-05-31 MED ORDER — ONDANSETRON HCL 4 MG/2ML IJ SOLN
4.0000 mg | INTRAMUSCULAR | Status: DC | PRN
Start: 2016-05-31 — End: 2016-06-01
  Administered 2016-05-31: 4 mg via INTRAVENOUS

## 2016-05-31 NOTE — Evaluation (Signed)
Physical Therapy Evaluation Patient Details Name: LOID ZOLNOWSKI MRN: [id number] DOB: [birth year] Today's Date: 05/31/2016   History of Present Illness  Pt is a 70 y/o male presenting with dizziness and a fall. Pt on observation for acute diarrhea and dehydration. PMH of anxiety, depression, DM, HTN, and hypercholesteremia. Pt has a scheduled R knee arthroplasty this upcoming October.   Clinical Impression  Pt lives in a 2 story home with his wife. Pt reports that he is not able to stay on the 1st level with approximately 10-12 steps with a R-sided railing. Pt also has 1 + 2 steps to get into home without railings. Pt currently uses a SPC PRN to ambulate. Pt has bil knee pain (some from baseline and some from the fall). Pt reports increased balance difficulties at home having 2 falls in the past 6 months. Pt at this time able to transfer sit to stand x1 with min guard. Pt immediately became dizzy and had difficulty responding to PT questions. BP changed from 119/56 at rest sitting to 114/46 with standing. Pt presentation was not improving with sitting and pt was instructed to lay back down. Pt voice hoarse and reported increased difficulty breathing though O2 maintained in the upper 90's (HR stayed in the 80's throughout session); RN notified of above.. After approximately 5 minutes semi-supine in bed pt responsiveness/presentation improving and close to the same at beginning of session. Pt will benefit from further PT for gait, stair, and balance assessments with improved medical presentation. Per chart pt was ambulating SBA with RN prior to evaluation. Once pt is feeling better, expected to be safe to discharge home with support of family.    Follow Up Recommendations Home health PT; supervision with mobility/OOB (pending progress)    Equipment Recommendations  Rolling walker with 5" wheels (Pt has a SPC at home)    Recommendations for Other Services       Precautions / Restrictions  Precautions Precautions: Fall Restrictions Weight Bearing Restrictions: No      Mobility  Bed Mobility Overal bed mobility: Needs Assistance Bed Mobility: Sit to Supine       Sit to supine: Supervision   General bed mobility comments: Pt sitting EOB upon PT arrival (at rest BP 119/56 and HR 80 bpm); Sit to supine supervision due to decreased responsiveness. PT attempted to keep pt talking; pt voice hoarse but within approximately 5 minutes was returning to normal amount of talking; pronator drift, facial symmetry (with smiling), and grip strength assessed in supine with no positive findings.    Transfers Overall transfer level: Needs assistance Equipment used: Rolling walker (2 wheeled) Transfers: Sit to/from Stand Sit to Stand: Min guard         General transfer comment: min vc's for handplacement on RW and to shift weight in standing; BP changed to 114/46 with standing. dizziness reported initially with a glazed look and increased time to respond to question; pt instructed to sit back down EOB; pt continued to have difficulty responding and reported worsening dizziness ("I feel like I am spinning")  Ambulation/Gait Ambulation/Gait assistance:  (deferred at this time due to increased dizziness and decreased responsiveness)              Stairs Stairs:  (deferred at this time due to pt decreased responsiveness)          Wheelchair Mobility    Modified Rankin (Stroke Patients Only)       Balance Overall balance assessment: Needs assistance (Pt reports that  he has noticed more LOB at home recently; 2 falls in the past 6 mo (1 d/t dizziness; 1 d/t LOB)) Sitting-balance support: Feet supported Sitting balance-Leahy Scale: Good Sitting balance - Comments: Pt able to lean forward out of base of support to put shoes on.   Standing balance support: Bilateral upper extremity supported Standing balance-Leahy Scale: Fair Standing balance comment: Pt increased feelings  of dizziness standing approximately 1 minute limited assessment; no LOB.                             Pertinent Vitals/Pain Pain Assessment: 0-10 Pain Score: 5  Pain Location: B knees Pain Descriptors / Indicators: Sore Pain Intervention(s): Limited activity within patient's tolerance;Monitored during session    Home Living Family/patient expects to be discharged to:: Private residence Living Arrangements: Spouse/significant other  Available Help at Discharge: Family  Type of Home: House  Home Access: Stairs to enter  Entrance Stairs-Rails: None Entrance Stairs-Number of Steps: 1 + 2  Home Layout: Two level;Bed/bath upstairs Home Equipment: Cane - single point  Additional Comments: cane was his father's    Prior Function Level of Independence: Independent with assistive device(s)          Comments: Pt occasionally used SPC     Hand Dominance   Dominant Hand: Right     Extremity/Trunk Assessment   Upper Extremity Assessment: Defer to OT evaluation           Lower Extremity Assessment: Generalized weakness (at least 3-/5; pt able to transfer sit to stand without knee buckling)         Communication   Communication: No difficulties  Cognition Arousal/Alertness: Awake/alert Behavior During Therapy: WFL for tasks assessed/performed Overall Cognitive Status: Within Functional Limits for tasks assessed                      General Comments General comments (skin integrity, edema, etc.): Pt agreeable to PT session.    Exercises General Exercises - Lower Extremity Ankle Circles/Pumps: AROM;Strengthening;Both;10 reps;Seated Long Arc Quad: AROM;Strengthening;Left;Seated (2 reps (limited due to increasing dizziness, pain, and decreased responsiveness))   Assessment/Plan    PT Assessment Patient needs continued PT services  PT Problem List Decreased strength;Decreased activity tolerance;Decreased balance;Decreased mobility;Pain          PT  Treatment Interventions DME instruction;Gait training;Stair training;Therapeutic activities;Therapeutic exercise;Balance training;Patient/family education    PT Goals (Current goals can be found in the Care Plan section)  Acute Rehab PT Goals Patient Stated Goal: To try to ambulate today and go home.  PT Goal Formulation: With patient Time For Goal Achievement: 06/14/16 Potential to Achieve Goals: Fair    Frequency Min 2X/week   Barriers to discharge        Co-evaluation               End of Session Equipment Utilized During Treatment: Gait belt Activity Tolerance: Other (comment) (Pt limited due to increased dizziness, decreased responsiveness with difficulty speaking (hoarse and facial twitching); RN notified) Patient left: in bed;with call bell/phone within reach;with bed alarm set Nurse Communication: Mobility status;Precautions;Weight bearing status;Other (comment) (vitals and current presentation)         Time: [id number] PT Time Calculation (min) (ACUTE ONLY): 41 min   Charges:         PT G Codes:        Daxter Paule, SPT 05/31/2016, 11:57 AM

## 2016-05-31 NOTE — Care Management Obs Status (Signed)
[facility] NOTIFICATION   Patient Details  Name: FREDRIC PRIMAS MRN: [id number] Date of Birth: Aug 08, 1946   Medicare Observation Status Notification Given:  Yes    Shelbie Ammons, RN 05/31/2016, 9:45 AM

## 2016-05-31 NOTE — Progress Notes (Signed)
SLP Cancellation Note  Patient Details Name: Patrick Turner MRN: [id number] DOB: 08/13/1946   Cancelled treatment:       Reason Eval/Treat Not Completed: SLP screened, no needs identified, will sign off (Nursing to reconsult if any change in status)   Rivka Safer, Claris Gower. Clinical Graduate Student 05/31/2016, 1:21 PM

## 2016-05-31 NOTE — Progress Notes (Signed)
[place] [facility] [place] [place] NAME: Patrick Turner    MR#:  [id number]  DATE OF BIRTH:  June 28, 1946  SUBJECTIVE: Admitted for diarrhea, acute gastroenteritis. Symptoms started after the party over the weekend. C. difficile has been negative. Still having diarrhea. No abdominal pain or nausea. Tolerated the liquid breakfast today. And he felt dizzy and he stood up with physical therapist. Patient has been having dizziness when he gets up. He says is not new.   CHIEF COMPLAINT:   Chief Complaint  Patient presents with  . Fall  . Dizziness    REVIEW OF SYSTEMS:    Review of Systems  Constitutional: Negative for chills and fever.  HENT: Negative for hearing loss.   Eyes: Negative for blurred vision, double vision and photophobia.  Respiratory: Negative for cough, hemoptysis and shortness of breath.   Cardiovascular: Negative for palpitations, orthopnea and leg swelling.  Gastrointestinal: Positive for diarrhea. Negative for abdominal pain, heartburn and vomiting.  Genitourinary: Negative for dysuria and urgency.  Musculoskeletal: Negative for myalgias and neck pain.  Skin: Negative for rash.  Neurological: Negative for dizziness, focal weakness, seizures, weakness and headaches.  Psychiatric/Behavioral: Negative for memory loss. The patient does not have insomnia.     Nutrition:  Tolerating Diet: Tolerating PT:      DRUG ALLERGIES:   Allergies  Allergen Reactions  . Levemir [Insulin Detemir] Other (See Comments)    Patient states that the generic gave him night terrors.  . Penicillins Anaphylaxis    Has patient had a PCN reaction causing immediate rash, facial/tongue/throat swelling, SOB or lightheadedness with hypotension: Yes Has patient had a PCN reaction causing severe rash involving mucus membranes or skin necrosis: No Has patient had a PCN reaction that required hospitalization Yes Has patient had a PCN reaction occurring  within the last 10 years: No If all of the above answers are "NO", then may proceed with Cephalosporin use.  . Tavist [Clemastine] Swelling    VITALS:  Blood pressure (!) 114/46, pulse 80, temperature 99 F (37.2 C), temperature source Oral, resp. rate 18, height 5\' 5"  (1.651 m), weight 97.9 kg (215 lb 12.8 oz), SpO2 97 %.  PHYSICAL EXAMINATION:   Physical Exam  GENERAL:  70 y.o.-year-old patient lying in the bed with no acute distress.  EYES: Pupils equal, round, reactive to light and accommodation. No scleral icterus. Extraocular muscles intact.  HEENT: Head atraumatic, normocephalic. Oropharynx and nasopharynx clear.  NECK:  Supple, no jugular venous distention. No thyroid enlargement, no tenderness.  LUNGS: Normal breath sounds bilaterally, no wheezing, rales,rhonchi or crepitation. No use of accessory muscles of respiration.  CARDIOVASCULAR: S1, S2 normal. No murmurs, rubs, or gallops.  ABDOMEN: Soft, nontender, nondistended. Bowel sounds present. No organomegaly or mass.  EXTREMITIES: No pedal edema, cyanosis, or clubbing.  NEUROLOGIC: Cranial nerves II through XII are intact. Muscle strength 5/5 in all extremities. Sensation intact. Gait not checked.  PSYCHIATRIC: The patient is alert and oriented x 3.  SKIN: No obvious rash, lesion, or ulcer.    LABORATORY PANEL:   CBC  Recent Labs Lab 05/31/16 0649  WBC 6.0  HGB 11.5*  HCT 33.9*  PLT 158   ------------------------------------------------------------------------------------------------------------------  Chemistries   Recent Labs Lab 05/30/16 1739 05/31/16 0543  NA  --  140  K  --  3.6  CL  --  116*  CO2  --  20*  GLUCOSE  --  99  BUN  --  30*  CREATININE  1.29* 1.27*  CALCIUM  --  7.9*  MG 1.8  --    ------------------------------------------------------------------------------------------------------------------  Cardiac Enzymes  Recent Labs Lab 05/30/16 0953  TROPONINI <0.03    ------------------------------------------------------------------------------------------------------------------  RADIOLOGY:  Ct Head Wo Contrast  Result Date: 05/30/2016 CLINICAL DATA:  Dizziness, fall.  No loss of consciousness. EXAM: CT HEAD WITHOUT CONTRAST TECHNIQUE: Contiguous axial images were obtained from the base of the skull through the vertex without intravenous contrast. COMPARISON:  CT scan of November 05, 2015. FINDINGS: Bony calvarium is unremarkable. Visualized paranasal sinuses appear normal. No mass effect or midline shift is noted. Ventricular size is within normal limits. There is no evidence of mass lesion, hemorrhage or acute infarction. IMPRESSION: Normal head CT. Electronically Signed   By: Marijo Conception, M.D.   On: 05/30/2016 10:30   US Venous Img Lower Bilateral  Result Date: 05/30/2016 CLINICAL DATA:  Lower extremity swelling EXAM: BILATERAL LOWER EXTREMITY VENOUS DUPLEX ULTRASOUND TECHNIQUE: Doppler venous assessment of the bilateral lower extremity deep venous system was performed, including characterization of spectral flow, compressibility, and phasicity. COMPARISON:  None. FINDINGS: There is complete compressibility of the bilateral common femoral, femoral, and popliteal veins. Doppler analysis demonstrates respiratory phasicity and augmentation of flow with calf compression. No obvious superficial vein or calf vein thrombosis. IMPRESSION: No evidence of lower extremity DVT. Electronically Signed   By: Marybelle Killings M.D.   On: 05/30/2016 16:57   Dg Knee Complete 4 Views Right  Result Date: 05/30/2016 CLINICAL DATA:  Status post fall.  Right anterior knee pain. EXAM: RIGHT KNEE - COMPLETE 4+ VIEW COMPARISON:  None. FINDINGS: No acute fracture dislocation. No lytic or sclerotic osseous lesion. Tricompartmental osteoarthritis of the right knee most severe in the medial femorotibial compartment and patellofemoral compartment. No significant joint effusion. IMPRESSION:  No acute osseous injury of the right knee. Tricompartmental osteoarthritis of the right knee. Electronically Signed   By: Kathreen Devoid   On: 05/30/2016 10:53     ASSESSMENT AND PLAN:   Principal Problem:   Acute diarrhea Active Problems:   Acute encephalopathy   Anemia   Acute on chronic renal insufficiency (HCC)   Diarrhea   #1 diarrhea likely due to viral gastroenteritis: Stool C. difficile has been negative. Patient's stool cultures are negative for Campylobacter, vibrio cholera,, Vitas. But it's positive for enteropathogenic Escherichia coli. hydration is  the treatment of choice for  EPEC.. No antibiotics are needed. Usually self-limiting. #2 encephalopathy secondary to diarrhea; resolved. #3 diabetes mellitus type 2: Hold the metformin, continue SSI with coverage  And basal insulin with levemir. #4 .essential l hypertension: Patient blood pressure  soft. Hold the bp  medications. #5 history of for dizziness when he stands up. Check orthostatic vitals.. D/w pt #6 acute renal failure: Improved with IV hydration. #7 anxiety: Continue Klonopin, Neurontin, Paxil, Seroquel, Topamax, when necessary Ativan. Appreciate presence of RN during rounds,  All the records are reviewed and case discussed with Care Management/Social Workerr. Management plans discussed with the patient, family and they are in agreement.  CODE STATUS:full  TOTAL TIME TAKING CARE OF THIS PATIENT: 35 minutes.   POSSIBLE D/C IN 1-12 DAYS, DEPENDING ON CLINICAL CONDITION.   Epifanio Lesches M.D on 05/31/2016 at 11:01 AM  Between 7am to 6pm - Pager - [phone]  After 6pm go to [email or web address] - password [email or web address] [facility]  Office  [phone]  CC: Primary care physician; Kirk Ruths., MD

## 2016-05-31 NOTE — Care Management (Signed)
Admitted to this facility with the diagnosis of acute diarrhea under observation status. Lives with wife, [name] [phone]). Seen Dr. Ouida Sills a few weeks ago. [facility] 05/30/16. Good appetite until yesterday. Takes care of all basic and instrumental activities of daily living himself, drives. Cane available, if needed. Prescriptions are filled at [facility] on [address].  Wife will transport. Shelbie Ammons RN MSN CCM Care Management [phone]

## 2016-05-31 NOTE — Progress Notes (Signed)
Made aware by physical therapy patient become dizzy and not following command upon standing. vss stable before and after per physical therapy. Once patient returned to bed patient was able to regain orientation and follow/answers command. Dr. Vianne Bulls on the floor, made aware of what physical therapy seen. Also made md aware of low bp and scheduled meds, per md hold due to blood pressure. Will continue to monitor

## 2016-05-31 NOTE — Evaluation (Signed)
Occupational Therapy Evaluation Patient Details Name: Patrick Turner MRN: [id number] DOB: 1946-02-12 Today's Date: 05/31/2016    History of Present Illness Pt. is a 70 y.o. male who was admitted to [facility] with Diarrhea, dizziness, and a fall.   Clinical Impression   Pt. Is a 70 y.o male who was admitted to [facility] with Diarrhea, dizziness, and a fall. Pt. has orthostatic issues, weakness, and decreased functional mobility which hinder her ability to complete ADL and IADL functioning. Pt. Could benefit from skilled OT services for ADL and IADL training, UE there. Ex, strengthening, functional mobility, and pt. Education in energy conservation/work simplification techniques in order to work towards returning to his PLOF of independence.     Follow Up Recommendations  No OT follow up    Equipment Recommendations       Recommendations for Other Services       Precautions / Restrictions Precautions Precautions: Fall Restrictions Weight Bearing Restrictions: No                                                     ADL Overall ADL's : Needs assistance/impaired Eating/Feeding: Set up   Grooming: Set up               Lower Body Dressing: Minimal assistance                       Vision     Perception     Praxis      Pertinent Vitals/Pain Pain Assessment: 0-10 Pain Score: 4  Pain Location: Bilateral kness     Hand Dominance Right (Simultaneous filing. User may not have seen previous data.)   Extremity/Trunk Assessment Upper Extremity Assessment Upper Extremity Assessment: Overall WFL for tasks assessed           Communication Communication Communication: No difficulties   Cognition Arousal/Alertness: Awake/alert Behavior During Therapy: WFL for tasks assessed/performed Overall Cognitive Status: Within Functional Limits for tasks assessed                     General Comments       Exercises       Shoulder  Instructions      Home Living Family/patient expects to be discharged to:: Private residence (Simultaneous filing. User may not have seen previous data.) Living Arrangements: Spouse/significant other (Simultaneous filing. User may not have seen previous data.) Available Help at Discharge: Family (Simultaneous filing. User may not have seen previous data.) Type of Home: House (Simultaneous filing. User may not have seen previous data.) Home Access: Stairs to enter (Simultaneous filing. User may not have seen previous data.) Entrance Stairs-Number of Steps: 1 + 2 (Simultaneous filing. User may not have seen previous data.) Entrance Stairs-Rails: None Home Layout: Two level;Bed/bath upstairs (Simultaneous filing. User may not have seen previous data.) Alternate Level Stairs-Number of Steps: approximately 10-12 Alternate Level Stairs-Rails: Right Bathroom Shower/Tub: Tub/shower unit Shower/tub characteristics: Door Bathroom Toilet: Standard     Home Equipment: Cane - single point (Simultaneous filing. User may not have seen previous data.)   Additional Comments: cane was his father's      Prior Functioning/Environment Level of Independence: Independent with assistive device(s) (Simultaneous filing. User may not have seen previous data.)        Comments: Pt occasionally used SPC  OT Problem List: Decreased strength;Pain;Decreased activity tolerance;Decreased safety awareness;Impaired UE functional use;Impaired vision/perception   OT Treatment/Interventions: Self-care/ADL training;Therapeutic exercise;Therapeutic activities;Patient/family education;DME and/or AE instruction    OT Goals(Current goals can be found in the care plan section)    OT Frequency: Min 1X/week   Barriers to D/C:            Co-evaluation              End of Session    Activity Tolerance: Patient tolerated treatment well Patient left: in bed;with call bell/phone within reach;with bed alarm  set;with family/visitor present   Time: [id number] OT Time Calculation (min): 23 min Charges:  OT General Charges $OT Visit: 1 Procedure OT Evaluation $OT Eval Moderate Complexity: 1 Procedure G-Codes: OT G-codes **NOT FOR INPATIENT CLASS** Functional Assessment Tool Used: Clinical judgement based on pt. current functional status. Functional Limitation: Self care Self Care Current Status [id number]): At least 1 percent but less than 20 percent impaired, limited or restricted Self Care Goal Status [id number]): 0 percent impaired, limited or restricted  Harrel Carina, MS, OTR/L 05/31/2016, 10:58 AM

## 2016-06-01 DIAGNOSIS — R197 Diarrhea, unspecified: Secondary | ICD-10-CM | POA: Diagnosis not present

## 2016-06-01 DIAGNOSIS — R600 Localized edema: Secondary | ICD-10-CM | POA: Diagnosis not present

## 2016-06-01 DIAGNOSIS — G9340 Encephalopathy, unspecified: Secondary | ICD-10-CM | POA: Diagnosis not present

## 2016-06-01 DIAGNOSIS — N289 Disorder of kidney and ureter, unspecified: Secondary | ICD-10-CM | POA: Diagnosis not present

## 2016-06-01 LAB — GLUCOSE, CAPILLARY
GLUCOSE-CAPILLARY: 129 mg/dL — AB (ref 65–99)
GLUCOSE-CAPILLARY: 89 mg/dL (ref 65–99)
Glucose-Capillary: 95 mg/dL (ref 65–99)

## 2016-06-01 LAB — NOROVIRUS GROUP 1 & 2 BY PCR, STOOL
Norovirus 1 by PCR: NEGATIVE
Norovirus 2  by PCR: NEGATIVE

## 2016-06-01 MED ORDER — METRONIDAZOLE 500 MG PO TABS
500.0000 mg | ORAL_TABLET | Freq: Once | ORAL | Status: AC
  Administered 2016-06-01: 09:00:00 500 mg via ORAL
  Filled 2016-06-01: qty 1

## 2016-06-01 MED ORDER — METRONIDAZOLE 500 MG PO TABS: 500.0000 mg | ORAL_TABLET | Freq: Three times a day (TID) | ORAL | 0 refills | Status: DC

## 2016-06-01 MED ORDER — LOPERAMIDE HCL 2 MG PO CAPS
4.0000 mg | ORAL_CAPSULE | Freq: Once | ORAL | Status: AC
  Administered 2016-06-01: 4 mg via ORAL
  Filled 2016-06-01: qty 2

## 2016-06-01 NOTE — Care Management (Signed)
Physical therapy evaluation completed. Recommending home with home health and physical therapy. Discussed these recommendations with Patrick Turner at the bedside. States he works at [employer] during [employer] and his wife works there during the day. Declining home health services at this time. Discharge to home today per Dr. Vianne Bulls. Shelbie Ammons RN MSN CCM Care Management [phone]

## 2016-06-01 NOTE — Progress Notes (Addendum)
Physical Therapy Treatment Patient Details Name: Patrick Turner MRN: [id number] DOB: 05/29/1946 Today's Date: 06/01/2016    History of Present Illness Pt. is a 70 y.o. male who was admitted to [facility] with Diarrhea, dizziness, and a fall.    PT Comments    Pt agreeable to PT; reports minimal pain in bilateral knees and denies dizziness in lying, sit or stand. Pt performs ambulation for greater distance without assistive device. Pt does have 1 minor loss of balance episode using scissor step to primarily self correct with Min guard for safety. Pt demonstrates negotiating up/down 6 steps twice with short stand break between. Pt performs without difficulty. Pt educated on energy conservation at home with regards to stair climbing as well as using step to pattern consistently for energy conservation and safety as well. Encouraged pt as well to use single point cane at home for added support consistently versus occasionally. Pt notes the past two falls have been when pt was attempting to carry things and ambulate. Encouraged decreased carry load and cane for safety. Pt understands. At sessions end, pt notes need to use the bathroom. Pt supervised to commode; nursing assistant notified and will monitor pt. Pt to be discharged home with intermittent supervision.   Follow Up Recommendations   Home health PT; intermittent supervision     Equipment Recommendations    has single point cane; encouraged to use at all times   Recommendations for Other Services       Precautions / Restrictions Precautions Precautions: Fall Restrictions Weight Bearing Restrictions: No    Mobility  Bed Mobility Overal bed mobility: Independent             General bed mobility comments: Supine to sit with independence, no dizziness upon sit  Transfers Overall transfer level: Independent   Transfers: Sit to/from Stand Sit to Stand: Independent         General transfer comment: Stands without device, no  dizziness, no LOB. Sits on commode with use of rail  Ambulation/Gait Ambulation/Gait assistance: Supervision Ambulation Distance (Feet): 230 Feet Assistive device: None Gait Pattern/deviations: Step-through pattern Gait velocity: reduced Gait velocity interpretation: Below normal speed for age/gender General Gait Details: Pt ambulates with mldly slow cadence with 1 minor LOB episode with scissor step to correct and does so with Min guard.    Stairs Stairs: Yes   Stair Management: One rail Right;Step to pattern;Forwards Number of Stairs: 6 (2x with brief rest) General stair comments: Attempts initial steps reciprocally with a little greater difficulty; encouraged step to pattern and performs well  Wheelchair Mobility    Modified Rankin (Stroke Patients Only)       Balance Overall balance assessment: Modified Independent                                  Cognition Arousal/Alertness: Awake/alert Behavior During Therapy: WFL for tasks assessed/performed Overall Cognitive Status: Within Functional Limits for tasks assessed                      Exercises      General Comments        Pertinent Vitals/Pain Pain Assessment: 0-10 Pain Score: 1  Pain Location: B knees Pain Descriptors / Indicators: Aching Pain Intervention(s): Monitored during session    Home Living                      Prior  Function            PT Goals (current goals can now be found in the care plan section) Progress towards PT goals: Progressing toward goals    Frequency     Min 2x/wk      PT Plan  Current plan remains the same    Co-evaluation             End of Session           Time: [id number] PT Time Calculation (min) (ACUTE ONLY): 23 min  Charges:  $Gait Training: 23-37 mins                    G Codes:      Larae Grooms, PTA 06/01/2016, 10:20 AM

## 2016-06-01 NOTE — Progress Notes (Signed)
Discharge instructions given and went over with patient at bedside. Prescriptions called into pharmacy. Follow up appointment reviewed. Patient verbalized understanding. Patient discharged home with wife via wheelchair by nursing staff. Madlyn Frankel, RN

## 2016-06-02 NOTE — Discharge Summary (Signed)
Patrick Turner, is a 70 y.o. male  DOB 06-17-1946  MRN [id number].  Admission date:  05/30/2016  Admitting Physician  Theodoro Grist, MD  Discharge Date:  06/01/2016   Primary MD  Kirk Ruths., MD  Recommendations for primary care physician for things to follow:   Follow up with primary doctor in 1 week   Admission Diagnosis  Dehydration [E86.0] Swelling [R60.9] Weakness [R53.1] Fall, initial encounter [W19.XXXA] Altered mental status, unspecified altered mental status type [R41.82] Diarrhea, unspecified type [R19.7]   Discharge Diagnosis  Dehydration [E86.0] Swelling [R60.9] Weakness [R53.1] Fall, initial encounter [W19.XXXA] Altered mental status, unspecified altered mental status type [R41.82] Diarrhea, unspecified type [R19.7]    Principal Problem:   Acute diarrhea Active Problems:   Acute encephalopathy   Anemia   Acute on chronic renal insufficiency (HCC)   Diarrhea      Past Medical History:  Diagnosis Date  . Anxiety   . Depression   . Diabetes mellitus without complication ([place])   . Hypercholesteremia   . Hypertension   . Sleep terror    per patient this year per patient     Past Surgical History:  Procedure Laterality Date  . APPENDECTOMY    . TONSILLECTOMY         History of present illness and  Hospital Course:     Kindly see H&P for history of present illness and admission details, please review complete Labs, Consult reports and Test reports for all details in brief  HPI  from the history and physical done on the day of admission  70 year old male patient admitted for diarrhea. Diarrhea started after he had a party over the weekend. No abdominal pain or nausea. Admitted for diarrhea.  Hospital Course   #1 acute diarrhea with acute gastroenteritis: Stool for C.  difficile has been negative. Patient received aggressive hydration. Patient does stool cultures positive for the enteropathogenic Escherichia coli, negative for any viruses. Treatment for EP EC is fluid resuscitation. But patient requested antibiotics and explained to him that antibiotics are not the treatment of choice for him but he insisted that he wanted antibiotic. Discharged him with Flagyl 500 MG 3 times a day for 3 days. 2 dizziness secondary to diarrhea improved. No orthostatic changes in the blood pressure. #3. Diabetes mellitus type 2: Advised him to hold the metformin for 2 days because of the diarrhea. And resume it after 2 days. He also was taking levemir.. #4 acute renal failure; improved with IV fluids. 5. anxiety, we'll continue Klonopin, Neurontin, Paxil, Seroquel, Topamax. Confusion on admission improved confusion thought to be secondary to diarrhea: Improved.  Discharge Condition: Stable   Follow UP  Follow-up Information    Kirk Ruths., MD. Go on 06/08/2016.   Specialty:  Internal Medicine Why:  at 3:15 p.m. Contact information: [place] [address] [phone]             Discharge Instructions  and  Discharge Medications     Discharge Instructions    Discharge instructions    Complete by:  As directed    Resume metformin in 2 days       Medication List    STOP taking these medications   metFORMIN 500 MG 24 hr tablet Commonly known as:  GLUCOPHAGE-XR     TAKE these medications   aspirin EC 325 MG tablet Take 325 mg by mouth daily.   atorvastatin 80 MG tablet Commonly known as:  LIPITOR Take 80 mg by mouth at bedtime.   clonazePAM 1 MG tablet Commonly known as:  KLONOPIN Take 0.5 mg by mouth at bedtime.   gabapentin 800 MG tablet Commonly known as:  NEURONTIN Take 800 mg by mouth 2 (two) times daily.   LEVEMIR 100 UNIT/ML injection Generic drug:  insulin detemir Inject 22 Units  into the skin at bedtime.   lisinopril-hydrochlorothiazide 20-12.5 MG tablet Commonly known as:  PRINZIDE,ZESTORETIC Take 1 tablet by mouth daily.   LORazepam 0.5 MG tablet Commonly known as:  ATIVAN Take 0.5 mg by mouth 2 (two) times daily as needed. For anxiety   metroNIDAZOLE 500 MG tablet Commonly known as:  FLAGYL Take 1 tablet (500 mg total) by mouth 3 (three) times daily.   omeprazole 40 MG capsule Commonly known as:  PRILOSEC Take 40 mg by mouth every morning.   PARoxetine 40 MG tablet Commonly known as:  PAXIL Take 40 mg by mouth at bedtime.   pioglitazone 15 MG tablet Commonly known as:  ACTOS Take 15 mg by mouth daily.   QUEtiapine 100 MG tablet Commonly known as:  SEROQUEL Take 100 mg by mouth at bedtime.   topiramate 100 MG tablet Commonly known as:  TOPAMAX Take 100 mg by mouth 2 (two) times daily.         Diet and Activity recommendation: See Discharge Instructions above   Consults obtained -None   Major procedures and Radiology Reports - PLEASE review detailed and final reports for all details, in brief -     Ct Head Wo Contrast  Result Date: 05/30/2016 CLINICAL DATA:  Dizziness, fall.  No loss of consciousness. EXAM: CT HEAD WITHOUT CONTRAST TECHNIQUE: Contiguous axial images were obtained from the base of the skull through the vertex without intravenous contrast. COMPARISON:  CT scan of November 05, 2015. FINDINGS: Bony calvarium is unremarkable. Visualized paranasal sinuses appear normal. No mass effect or midline shift is noted. Ventricular size is within normal limits. There is no evidence of mass lesion, hemorrhage or acute infarction. IMPRESSION: Normal head CT. Electronically Signed   By: Marijo Conception, M.D.   On: 05/30/2016 10:30   US Venous Img Lower Bilateral  Result Date: 05/30/2016 CLINICAL DATA:  Lower extremity swelling EXAM: BILATERAL LOWER EXTREMITY VENOUS DUPLEX ULTRASOUND TECHNIQUE: Doppler venous assessment of the bilateral  lower extremity deep venous system was performed, including characterization of spectral flow, compressibility, and phasicity. COMPARISON:  None. FINDINGS: There is complete compressibility of the bilateral common femoral, femoral, and popliteal veins. Doppler analysis demonstrates respiratory phasicity and augmentation of flow with calf compression. No obvious superficial vein or calf vein thrombosis. IMPRESSION: No evidence of lower extremity DVT. Electronically Signed   By: Marybelle Killings M.D.   On: 05/30/2016 16:57   Dg Knee Complete 4 Views Right  Result Date: 05/30/2016 CLINICAL DATA:  Status post fall.  Right anterior knee pain. EXAM: RIGHT KNEE - COMPLETE 4+ VIEW COMPARISON:  None. FINDINGS: No acute fracture dislocation. No lytic or sclerotic osseous lesion. Tricompartmental osteoarthritis of the right knee most severe in the medial femorotibial compartment and patellofemoral compartment. No significant joint effusion. IMPRESSION: No acute osseous injury of the right knee. Tricompartmental osteoarthritis of the right knee. Electronically Signed   By: Kathreen Devoid   On: 05/30/2016 10:53    Micro Results    Recent Results (from the past 240 hour(s))  Gastrointestinal Panel by PCR , Stool     Status: Abnormal   Collection Time: 05/30/16  4:00 PM  Result Value Ref Range Status   Campylobacter species NOT DETECTED NOT DETECTED Final   Plesimonas shigelloides NOT DETECTED NOT DETECTED Final   Salmonella species NOT DETECTED NOT DETECTED Final   Yersinia enterocolitica NOT DETECTED NOT DETECTED Final   Vibrio species NOT DETECTED NOT DETECTED Final   Vibrio cholerae NOT DETECTED NOT DETECTED Final   Enteroaggregative E coli (EAEC) NOT DETECTED NOT DETECTED Final   Enteropathogenic E coli (EPEC) DETECTED (A) NOT DETECTED Final    Comment: RESULT CALLED TO, READ BACK BY AND VERIFIED WITH: Aris Georgia RN AT 1945 05/30/16 MSS.    Enterotoxigenic E coli (ETEC) NOT DETECTED NOT DETECTED Final    Shiga like toxin producing E coli (STEC) NOT DETECTED NOT DETECTED Final   E. coli O157 NOT DETECTED NOT DETECTED Final   Shigella/Enteroinvasive E coli (EIEC) NOT DETECTED NOT DETECTED Final   Cryptosporidium NOT DETECTED NOT DETECTED Final   Cyclospora cayetanensis NOT DETECTED NOT DETECTED Final   Entamoeba histolytica NOT DETECTED NOT DETECTED Final   Giardia lamblia NOT DETECTED NOT DETECTED Final   Adenovirus F40/41 NOT DETECTED NOT DETECTED Final   Astrovirus NOT DETECTED NOT DETECTED Final   Norovirus GI/GII NOT DETECTED NOT DETECTED Final   Rotavirus A NOT DETECTED NOT DETECTED Final   Sapovirus (I, II, IV, and V) NOT DETECTED NOT DETECTED Final  C difficile quick scan w PCR reflex     Status: None   Collection Time: 05/30/16  4:00 PM  Result Value Ref Range Status   C Diff antigen NEGATIVE NEGATIVE Final   C Diff toxin NEGATIVE NEGATIVE Final   C Diff interpretation No C. difficile detected.  Final       Today   Subjective:   Loistine Chance today has no headache,no chest abdominal pain,no new weakness tingling or numbness, feels much better wants to go home today.  Objective:   Blood pressure (!) 133/53, pulse 73, temperature 98.7 F (37.1 C), temperature source Oral, resp. rate 16, height 5\' 5"  (1.651 m), weight 97.9 kg (215 lb 12.8 oz), SpO2 97 %.  No intake or output data in the 24 hours ending 06/02/16 1953  Exam Awake Alert, Oriented x 3, No new F.N deficits, Normal affect [place].AT,PERRAL Supple Neck,No JVD, No cervical lymphadenopathy appriciated.  Symmetrical Chest wall movement, Good air movement bilaterally, CTAB RRR,No Gallops,Rubs or new Murmurs, No Parasternal Heave +ve B.Sounds, Abd Soft, Non tender, No organomegaly appriciated, No rebound -guarding or rigidity. No Cyanosis, Clubbing or edema, No new Rash or bruise  Data Review   CBC w Diff:  Lab Results  Component Value Date   WBC 6.0 05/31/2016   HGB 11.5 (L) 05/31/2016   HGB 14.0  11/24/2012   HCT 33.9 (L) 05/31/2016   HCT 42.5 11/24/2012   PLT 158 05/31/2016   PLT 238 11/24/2012   LYMPHOPCT 18 01/10/2016   LYMPHOPCT 25.3 11/24/2012   MONOPCT 9 01/10/2016   MONOPCT 10.4 11/24/2012   EOSPCT 1 01/10/2016   EOSPCT 6.5 11/24/2012   BASOPCT 1 01/10/2016   BASOPCT 1.0 11/24/2012    CMP:  Lab Results  Component Value Date   NA 140 05/31/2016   NA 139 11/24/2012   K 3.6 05/31/2016   K 4.4 11/24/2012   CL 116 (H) 05/31/2016   CL 107 11/24/2012   CO2 20 (L) 05/31/2016   CO2 29 11/24/2012   BUN 30 (H) 05/31/2016   BUN 20 (H) 11/24/2012   CREATININE 1.27 (H) 05/31/2016  CREATININE 1.27 11/24/2012   PROT 6.8 01/10/2016   PROT 6.6 11/23/2012   ALBUMIN 3.8 01/10/2016   ALBUMIN 3.2 (L) 11/23/2012   BILITOT 0.5 01/10/2016   BILITOT 0.2 11/23/2012   ALKPHOS 65 01/10/2016   ALKPHOS 71 11/23/2012   AST 20 01/10/2016   AST 38 (H) 11/23/2012   ALT 15 (L) 01/10/2016   ALT 25 11/23/2012  .   Total Time in preparing paper work, data evaluation and todays exam - 52 minutes  Renwick Asman M.D on 06/01/2016 at 7:53 PM    Note: This dictation was prepared with Dragon dictation along with smaller phrase technology. Any transcriptional errors that result from this process are unintentional.

## 2016-06-08 DIAGNOSIS — N183 Chronic kidney disease, stage 3 (moderate): Secondary | ICD-10-CM | POA: Diagnosis not present

## 2016-06-08 DIAGNOSIS — I10 Essential (primary) hypertension: Secondary | ICD-10-CM | POA: Diagnosis not present

## 2016-06-08 DIAGNOSIS — E1122 Type 2 diabetes mellitus with diabetic chronic kidney disease: Secondary | ICD-10-CM | POA: Diagnosis not present

## 2016-06-14 DIAGNOSIS — M25561 Pain in right knee: Secondary | ICD-10-CM | POA: Diagnosis not present

## 2016-06-15 ENCOUNTER — Encounter
Admission: RE | Admit: 2016-06-15 | Discharge: 2016-06-15 | Disposition: A | Discharge status: Home / Self Care | Payer: Commercial Managed Care - HMO | Source: Ambulatory Visit | Attending: Orthopedic Surgery | Admitting: Orthopedic Surgery

## 2016-06-15 DIAGNOSIS — Z01818 Encounter for other preprocedural examination: Secondary | ICD-10-CM | POA: Insufficient documentation

## 2016-06-15 HISTORY — DX: Chronic kidney disease, unspecified: N18.9

## 2016-06-15 HISTORY — DX: Polyneuropathy, unspecified: G62.9

## 2016-06-15 HISTORY — DX: Gastro-esophageal reflux disease without esophagitis: K21.9

## 2016-06-15 HISTORY — DX: Altered mental status, unspecified: R41.82

## 2016-06-15 HISTORY — DX: Sleep apnea, unspecified: G47.30

## 2016-06-15 HISTORY — DX: Unspecified osteoarthritis, unspecified site: M19.90

## 2016-06-15 LAB — COMPREHENSIVE METABOLIC PANEL
ALT: 17 U/L (ref 17–63)
AST: 23 U/L (ref 15–41)
Albumin: 3.8 g/dL (ref 3.5–5.0)
Alkaline Phosphatase: 86 U/L (ref 38–126)
Anion gap: 5 (ref 5–15)
BUN: 30 mg/dL — AB (ref 6–20)
CHLORIDE: 109 mmol/L (ref 101–111)
CO2: 26 mmol/L (ref 22–32)
Calcium: 8.7 mg/dL — ABNORMAL LOW (ref 8.9–10.3)
Creatinine, Ser: 1.44 mg/dL — ABNORMAL HIGH (ref 0.61–1.24)
GFR, EST AFRICAN AMERICAN: 55 mL/min — AB (ref >60)
GFR, EST NON AFRICAN AMERICAN: 48 mL/min — AB (ref >60)
Glucose, Bld: 187 mg/dL — ABNORMAL HIGH (ref 65–99)
POTASSIUM: 4.2 mmol/L (ref 3.5–5.1)
Sodium: 140 mmol/L (ref 135–145)
Total Bilirubin: 0.5 mg/dL (ref 0.3–1.2)
Total Protein: 6.9 g/dL (ref 6.5–8.1)

## 2016-06-15 LAB — URINALYSIS COMPLETE WITH MICROSCOPIC (ARMC ONLY)
BILIRUBIN URINE: NEGATIVE
Bacteria, UA: NONE SEEN
GLUCOSE, UA: NEGATIVE mg/dL
HGB URINE DIPSTICK: NEGATIVE
Ketones, ur: NEGATIVE mg/dL
Leukocytes, UA: NEGATIVE
Nitrite: NEGATIVE
PH: 6 (ref 5.0–8.0)
Protein, ur: NEGATIVE mg/dL
RBC / HPF: NONE SEEN RBC/hpf (ref 0–5)
SPECIFIC GRAVITY, URINE: 1.011 (ref 1.005–1.030)
Squamous Epithelial / LPF: NONE SEEN

## 2016-06-15 LAB — CBC
HCT: 37 % — ABNORMAL LOW (ref 40.0–52.0)
Hemoglobin: 12.1 g/dL — ABNORMAL LOW (ref 13.0–18.0)
MCH: 31.1 pg (ref 26.0–34.0)
MCHC: 32.6 g/dL (ref 32.0–36.0)
MCV: 95.2 fL (ref 80.0–100.0)
PLATELETS: 277 10*3/uL (ref 150–440)
RBC: 3.88 MIL/uL — ABNORMAL LOW (ref 4.40–5.90)
RDW: 14.8 % — AB (ref 11.5–14.5)
WBC: 8.3 10*3/uL (ref 3.8–10.6)

## 2016-06-15 LAB — TYPE AND SCREEN
ABO/RH(D): O POS
ANTIBODY SCREEN: NEGATIVE

## 2016-06-15 LAB — SURGICAL PCR SCREEN
MRSA, PCR: NEGATIVE
Staphylococcus aureus: NEGATIVE

## 2016-06-15 LAB — PROTIME-INR
INR: 0.98
PROTHROMBIN TIME: 13 s (ref 11.4–15.2)

## 2016-06-15 LAB — SEDIMENTATION RATE: Sed Rate: 22 mm/hr — ABNORMAL HIGH (ref 0–20)

## 2016-06-15 LAB — APTT: aPTT: 30 seconds (ref 24–36)

## 2016-06-15 NOTE — Patient Instructions (Addendum)
  Your procedure is scheduled on: 06/27/16 Mon Report to Same Day Surgery 2nd floor medical mall To find out your arrival time please call [phone] between 1PM - 3PM on 06/24/16 Fri  Remember: Instructions that are not followed completely may result in serious medical risk, up to and including death, or upon the discretion of your surgeon and anesthesiologist your surgery may need to be rescheduled.    _x___ 1. Do not eat food or drink liquids after midnight. No gum chewing or hard candies.     __x__ 2. No Alcohol for 24 hours before or after surgery.   __x__3. No Smoking for 24 prior to surgery.   ____  4. Bring all medications with you on the day of surgery if instructed.    __x__ 5. Notify your doctor if there is any change in your medical condition     (cold, fever, infections).     Do not wear jewelry, make-up, hairpins, clips or nail polish.  Do not wear lotions, powders, or perfumes. You may wear deodorant.  Do not shave 48 hours prior to surgery. Men may shave face and neck.  Do not bring valuables to the hospital.    [facility] is not responsible for any belongings or valuables.               Contacts, dentures or bridgework may not be worn into surgery.  Leave your suitcase in the car. After surgery it may be brought to your room.  For patients admitted to the hospital, discharge time is determined by your treatment team.   Patients discharged the day of surgery will not be allowed to drive home.    Please read over the following fact sheets that you were given:   [facility] Preparing for Surgery and or MRSA Information   _x___ Take these medicines the morning of surgery with A SIP OF WATER:    1. gabapentin (NEURONTIN) 800 MG tablet  2.topiramate (TOPAMAX) 100 MG tablet  3.omeprazole (PRILOSEC) 40 MG capsule  4.  5.  6.  ____Fleets enema or Magnesium Citrate as directed.   _x___ Use CHG Soap or sage wipes as directed on instruction sheet   ____ Use  inhalers on the day of surgery and bring to hospital day of surgery  ____ Stop metformin 2 days prior to surgery    __x__ Take 1/2 of usual insulin dose the night before surgery and none on the morning of           surgery.   _x___ Stop aspirin or coumadin, or plavix Stop aspirin 1 week before surgery  x__ Stop Anti-inflammatories such as Advil, Aleve, Ibuprofen, Motrin, Naproxen,          Naprosyn, Goodies powders or aspirin products. Ok to take Tylenol.   ____ Stop supplements until after surgery.    ____ Bring C-Pap to the hospital.

## 2016-06-16 LAB — URINE CULTURE: Special Requests: NORMAL

## 2016-06-16 LAB — HEMOGLOBIN A1C
Hgb A1c MFr Bld: 7.1 % — ABNORMAL HIGH (ref 4.8–5.6)
Mean Plasma Glucose: 157 mg/dL

## 2016-06-17 DIAGNOSIS — F333 Major depressive disorder, recurrent, severe with psychotic symptoms: Secondary | ICD-10-CM | POA: Diagnosis not present

## 2016-06-27 ENCOUNTER — Encounter: Payer: Self-pay | Admitting: Orthopedic Surgery

## 2016-06-27 ENCOUNTER — Encounter
Admission: RE | Disposition: A | Discharge status: Home Health | Payer: Self-pay | Source: Ambulatory Visit | Attending: Orthopedic Surgery

## 2016-06-27 ENCOUNTER — Inpatient Hospital Stay: Payer: Commercial Managed Care - HMO

## 2016-06-27 ENCOUNTER — Inpatient Hospital Stay: Payer: Commercial Managed Care - HMO | Admitting: Certified Registered Nurse Anesthetist

## 2016-06-27 ENCOUNTER — Inpatient Hospital Stay
Admission: RE | Admit: 2016-06-27 | Discharge: 2016-06-29 | DRG: 470 | Disposition: A | Discharge status: Home Health | Payer: Commercial Managed Care - HMO | Source: Ambulatory Visit | Attending: Orthopedic Surgery | Admitting: Orthopedic Surgery

## 2016-06-27 DIAGNOSIS — K219 Gastro-esophageal reflux disease without esophagitis: Secondary | ICD-10-CM | POA: Diagnosis present

## 2016-06-27 DIAGNOSIS — Z96659 Presence of unspecified artificial knee joint: Secondary | ICD-10-CM | POA: Diagnosis not present

## 2016-06-27 DIAGNOSIS — F419 Anxiety disorder, unspecified: Secondary | ICD-10-CM | POA: Diagnosis not present

## 2016-06-27 DIAGNOSIS — F329 Major depressive disorder, single episode, unspecified: Secondary | ICD-10-CM | POA: Diagnosis not present

## 2016-06-27 DIAGNOSIS — I129 Hypertensive chronic kidney disease with stage 1 through stage 4 chronic kidney disease, or unspecified chronic kidney disease: Secondary | ICD-10-CM | POA: Diagnosis not present

## 2016-06-27 DIAGNOSIS — N183 Chronic kidney disease, stage 3 (moderate): Secondary | ICD-10-CM | POA: Diagnosis not present

## 2016-06-27 DIAGNOSIS — D62 Acute posthemorrhagic anemia: Secondary | ICD-10-CM | POA: Diagnosis not present

## 2016-06-27 DIAGNOSIS — E1122 Type 2 diabetes mellitus with diabetic chronic kidney disease: Secondary | ICD-10-CM | POA: Diagnosis not present

## 2016-06-27 DIAGNOSIS — R262 Difficulty in walking, not elsewhere classified: Secondary | ICD-10-CM

## 2016-06-27 DIAGNOSIS — G4730 Sleep apnea, unspecified: Secondary | ICD-10-CM | POA: Diagnosis present

## 2016-06-27 DIAGNOSIS — M1711 Unilateral primary osteoarthritis, right knee: Principal | ICD-10-CM | POA: Diagnosis present

## 2016-06-27 DIAGNOSIS — Z471 Aftercare following joint replacement surgery: Secondary | ICD-10-CM | POA: Diagnosis not present

## 2016-06-27 DIAGNOSIS — Z96651 Presence of right artificial knee joint: Secondary | ICD-10-CM | POA: Diagnosis not present

## 2016-06-27 DIAGNOSIS — E7800 Pure hypercholesterolemia, unspecified: Secondary | ICD-10-CM | POA: Diagnosis present

## 2016-06-27 DIAGNOSIS — R4781 Slurred speech: Secondary | ICD-10-CM

## 2016-06-27 DIAGNOSIS — G629 Polyneuropathy, unspecified: Secondary | ICD-10-CM | POA: Diagnosis not present

## 2016-06-27 DIAGNOSIS — M6281 Muscle weakness (generalized): Secondary | ICD-10-CM

## 2016-06-27 DIAGNOSIS — M25561 Pain in right knee: Secondary | ICD-10-CM

## 2016-06-27 HISTORY — PX: KNEE ARTHROPLASTY: SHX992

## 2016-06-27 LAB — CBC
HEMATOCRIT: 36.8 % — AB (ref 40.0–52.0)
Hemoglobin: 11.9 g/dL — ABNORMAL LOW (ref 13.0–18.0)
MCH: 30.7 pg (ref 26.0–34.0)
MCHC: 32.3 g/dL (ref 32.0–36.0)
MCV: 95.1 fL (ref 80.0–100.0)
Platelets: 199 10*3/uL (ref 150–440)
RBC: 3.87 MIL/uL — AB (ref 4.40–5.90)
RDW: 14.7 % — ABNORMAL HIGH (ref 11.5–14.5)
WBC: 7.1 10*3/uL (ref 3.8–10.6)

## 2016-06-27 LAB — GLUCOSE, CAPILLARY
GLUCOSE-CAPILLARY: 87 mg/dL (ref 65–99)
Glucose-Capillary: 65 mg/dL (ref 65–99)
Glucose-Capillary: 106 mg/dL — ABNORMAL HIGH (ref 65–99)
Glucose-Capillary: 106 mg/dL — ABNORMAL HIGH (ref 65–99)
Glucose-Capillary: 143 mg/dL — ABNORMAL HIGH (ref 65–99)

## 2016-06-27 LAB — ABO/RH: ABO/RH(D): O POS

## 2016-06-27 SURGERY — ARTHROPLASTY, KNEE, TOTAL, USING IMAGELESS COMPUTER-ASSISTED NAVIGATION
Anesthesia: Spinal | Site: Knee | Laterality: Right | Wound class: Clean

## 2016-06-27 MED ORDER — TRANEXAMIC ACID 1000 MG/10ML IV SOLN
1000.0000 mg | INTRAVENOUS | Status: AC
  Administered 2016-06-27: 1000 mg via INTRAVENOUS
  Filled 2016-06-27: qty 10

## 2016-06-27 MED ORDER — METOCLOPRAMIDE HCL 10 MG PO TABS
10.0000 mg | ORAL_TABLET | Freq: Three times a day (TID) | ORAL | Status: AC
  Administered 2016-06-27 – 2016-06-29 (×8): 10 mg via ORAL
  Filled 2016-06-27 (×8): qty 1

## 2016-06-27 MED ORDER — OXYCODONE HCL 5 MG PO TABS
5.0000 mg | ORAL_TABLET | ORAL | Status: DC | PRN
Start: 2016-06-27 — End: 2016-06-29
  Administered 2016-06-27: 5 mg via ORAL
  Administered 2016-06-29: 10 mg via ORAL
  Filled 2016-06-27: qty 1
  Filled 2016-06-27: qty 2

## 2016-06-27 MED ORDER — ONDANSETRON HCL 4 MG PO TABS: 4.0000 mg | ORAL_TABLET | Freq: Four times a day (QID) | ORAL | Status: DC | PRN

## 2016-06-27 MED ORDER — PROPOFOL 10 MG/ML IV BOLUS
INTRAVENOUS | Status: DC | PRN
  Administered 2016-06-27: 30 mg via INTRAVENOUS

## 2016-06-27 MED ORDER — ACETAMINOPHEN 650 MG RE SUPP: 650.0000 mg | Freq: Four times a day (QID) | RECTAL | Status: DC | PRN

## 2016-06-27 MED ORDER — VITAMIN B-12 1000 MCG PO TABS
1000.0000 ug | ORAL_TABLET | Freq: Every day | ORAL | Status: DC
  Administered 2016-06-27 – 2016-06-29 (×3): 1000 ug via ORAL
  Filled 2016-06-27 (×3): qty 1

## 2016-06-27 MED ORDER — DEXTROSE 50 % IV SOLN
INTRAVENOUS | Status: AC
  Filled 2016-06-27: qty 50

## 2016-06-27 MED ORDER — CLINDAMYCIN PHOSPHATE 900 MG/50ML IV SOLN
900.0000 mg | INTRAVENOUS | Status: AC
  Administered 2016-06-27: 900 mg via INTRAVENOUS

## 2016-06-27 MED ORDER — ONDANSETRON HCL 4 MG/2ML IJ SOLN: 4.0000 mg | Freq: Once | INTRAMUSCULAR | Status: DC | PRN

## 2016-06-27 MED ORDER — BUPIVACAINE LIPOSOME 1.3 % IJ SUSP
INTRAMUSCULAR | Status: AC
  Filled 2016-06-27: qty 20

## 2016-06-27 MED ORDER — GABAPENTIN 400 MG PO CAPS
800.0000 mg | ORAL_CAPSULE | Freq: Two times a day (BID) | ORAL | Status: DC
  Administered 2016-06-27 – 2016-06-29 (×4): 800 mg via ORAL
  Filled 2016-06-27 (×4): qty 2

## 2016-06-27 MED ORDER — ACETAMINOPHEN 10 MG/ML IV SOLN
INTRAVENOUS | Status: AC
  Filled 2016-06-27: qty 100

## 2016-06-27 MED ORDER — DEXTROSE 50 % IV SOLN
25.0000 mL | Freq: Once | INTRAVENOUS | Status: AC
Start: 2016-06-27 — End: 2016-06-27
  Administered 2016-06-27: 25 mL via INTRAVENOUS

## 2016-06-27 MED ORDER — MIDAZOLAM HCL 5 MG/5ML IJ SOLN
INTRAMUSCULAR | Status: DC | PRN
  Administered 2016-06-27 (×2): 1 mg via INTRAVENOUS

## 2016-06-27 MED ORDER — ONDANSETRON HCL 4 MG/2ML IJ SOLN
INTRAMUSCULAR | Status: DC | PRN
  Administered 2016-06-27: 4 mg via INTRAVENOUS

## 2016-06-27 MED ORDER — BUPIVACAINE HCL (PF) 0.25 % IJ SOLN
INTRAMUSCULAR | Status: DC | PRN
  Administered 2016-06-27: 60 mL

## 2016-06-27 MED ORDER — TETRACAINE HCL 1 % IJ SOLN
INTRAMUSCULAR | Status: AC
  Filled 2016-06-27: qty 2

## 2016-06-27 MED ORDER — MENTHOL 3 MG MT LOZG
1.0000 | LOZENGE | OROMUCOSAL | Status: DC | PRN
  Filled 2016-06-27: qty 9

## 2016-06-27 MED ORDER — CLINDAMYCIN PHOSPHATE 600 MG/50ML IV SOLN
600.0000 mg | Freq: Four times a day (QID) | INTRAVENOUS | Status: AC
  Administered 2016-06-27 – 2016-06-28 (×4): 600 mg via INTRAVENOUS
  Filled 2016-06-27 (×4): qty 50

## 2016-06-27 MED ORDER — ACETAMINOPHEN 10 MG/ML IV SOLN
INTRAVENOUS | Status: DC | PRN
  Administered 2016-06-27: 1000 mg via INTRAVENOUS

## 2016-06-27 MED ORDER — SODIUM CHLORIDE 0.9 % IV SOLN
INTRAVENOUS | Status: DC
  Administered 2016-06-27 – 2016-06-28 (×3): via INTRAVENOUS

## 2016-06-27 MED ORDER — LIDOCAINE HCL (CARDIAC) 20 MG/ML IV SOLN
INTRAVENOUS | Status: DC | PRN
  Administered 2016-06-27: 30 mg via INTRAVENOUS

## 2016-06-27 MED ORDER — ONDANSETRON HCL 4 MG/2ML IJ SOLN: 4.0000 mg | Freq: Four times a day (QID) | INTRAMUSCULAR | Status: DC | PRN

## 2016-06-27 MED ORDER — TRANEXAMIC ACID 1000 MG/10ML IV SOLN
1000.0000 mg | Freq: Once | INTRAVENOUS | Status: AC
  Administered 2016-06-27: 1000 mg via INTRAVENOUS
  Filled 2016-06-27: qty 10

## 2016-06-27 MED ORDER — MORPHINE SULFATE (PF) 2 MG/ML IV SOLN
2.0000 mg | INTRAVENOUS | Status: DC | PRN
  Administered 2016-06-27: 2 mg via INTRAVENOUS
  Filled 2016-06-27: qty 1

## 2016-06-27 MED ORDER — SENNOSIDES-DOCUSATE SODIUM 8.6-50 MG PO TABS
1.0000 | ORAL_TABLET | Freq: Two times a day (BID) | ORAL | Status: DC
  Administered 2016-06-27 – 2016-06-29 (×4): 1 via ORAL
  Filled 2016-06-27 (×4): qty 1

## 2016-06-27 MED ORDER — SODIUM CHLORIDE FLUSH 0.9 % IV SOLN
INTRAVENOUS | Status: AC
  Filled 2016-06-27: qty 10

## 2016-06-27 MED ORDER — BUPIVACAINE HCL (PF) 0.25 % IJ SOLN
INTRAMUSCULAR | Status: AC
  Filled 2016-06-27: qty 60

## 2016-06-27 MED ORDER — FLEET ENEMA 7-19 GM/118ML RE ENEM: 1.0000 | ENEMA | Freq: Once | RECTAL | Status: DC | PRN

## 2016-06-27 MED ORDER — CLONAZEPAM 0.5 MG PO TABS
0.5000 mg | ORAL_TABLET | Freq: Every day | ORAL | Status: DC
  Administered 2016-06-27 – 2016-06-28 (×2): 0.5 mg via ORAL
  Filled 2016-06-27 (×2): qty 1

## 2016-06-27 MED ORDER — TETRACAINE HCL 1 % IJ SOLN
INTRAMUSCULAR | Status: DC | PRN
  Administered 2016-06-27: 10 mg via INTRASPINAL

## 2016-06-27 MED ORDER — PIOGLITAZONE HCL 15 MG PO TABS
15.0000 mg | ORAL_TABLET | Freq: Every day | ORAL | Status: DC
  Administered 2016-06-27 – 2016-06-29 (×3): 15 mg via ORAL
  Filled 2016-06-27 (×3): qty 1

## 2016-06-27 MED ORDER — SODIUM CHLORIDE 0.9 % IV SOLN
INTRAVENOUS | Status: DC
  Administered 2016-06-27: 11:00:00 via INTRAVENOUS

## 2016-06-27 MED ORDER — INSULIN ASPART 100 UNIT/ML ~~LOC~~ SOLN
5.0000 [IU] | Freq: Two times a day (BID) | SUBCUTANEOUS | Status: DC
  Administered 2016-06-27 – 2016-06-29 (×4): 5 [IU] via SUBCUTANEOUS
  Filled 2016-06-27 (×3): qty 5

## 2016-06-27 MED ORDER — FERROUS SULFATE 325 (65 FE) MG PO TABS
325.0000 mg | ORAL_TABLET | Freq: Two times a day (BID) | ORAL | Status: DC
  Administered 2016-06-27 – 2016-06-29 (×4): 325 mg via ORAL
  Filled 2016-06-27 (×4): qty 1

## 2016-06-27 MED ORDER — ENOXAPARIN SODIUM 30 MG/0.3ML ~~LOC~~ SOLN
30.0000 mg | Freq: Two times a day (BID) | SUBCUTANEOUS | Status: DC
  Administered 2016-06-28 – 2016-06-29 (×3): 30 mg via SUBCUTANEOUS
  Filled 2016-06-27 (×3): qty 0.3

## 2016-06-27 MED ORDER — ATORVASTATIN CALCIUM 20 MG PO TABS
80.0000 mg | ORAL_TABLET | Freq: Every day | ORAL | Status: DC
  Administered 2016-06-27 – 2016-06-28 (×2): 80 mg via ORAL
  Filled 2016-06-27 (×2): qty 4

## 2016-06-27 MED ORDER — SODIUM CHLORIDE 0.9 % IJ SOLN
INTRAMUSCULAR | Status: AC
  Filled 2016-06-27: qty 50

## 2016-06-27 MED ORDER — INSULIN DETEMIR 100 UNIT/ML ~~LOC~~ SOLN
22.0000 [IU] | Freq: Every day | SUBCUTANEOUS | Status: DC
  Administered 2016-06-27 – 2016-06-28 (×2): 22 [IU] via SUBCUTANEOUS
  Filled 2016-06-27 (×3): qty 0.22

## 2016-06-27 MED ORDER — LISINOPRIL-HYDROCHLOROTHIAZIDE 20-12.5 MG PO TABS: 1.0000 | ORAL_TABLET | Freq: Every day | ORAL | Status: DC

## 2016-06-27 MED ORDER — QUETIAPINE FUMARATE 25 MG PO TABS
100.0000 mg | ORAL_TABLET | Freq: Every day | ORAL | Status: DC
  Administered 2016-06-27 – 2016-06-28 (×2): 100 mg via ORAL
  Filled 2016-06-27 (×2): qty 4

## 2016-06-27 MED ORDER — PANTOPRAZOLE SODIUM 40 MG PO TBEC
40.0000 mg | DELAYED_RELEASE_TABLET | Freq: Two times a day (BID) | ORAL | Status: DC
  Administered 2016-06-27 – 2016-06-29 (×4): 40 mg via ORAL
  Filled 2016-06-27 (×4): qty 1

## 2016-06-27 MED ORDER — FENTANYL CITRATE (PF) 100 MCG/2ML IJ SOLN
INTRAMUSCULAR | Status: DC | PRN
  Administered 2016-06-27 (×2): 50 ug via INTRAVENOUS

## 2016-06-27 MED ORDER — METFORMIN HCL 500 MG PO TABS
1000.0000 mg | ORAL_TABLET | Freq: Two times a day (BID) | ORAL | Status: DC
  Administered 2016-06-27 – 2016-06-29 (×4): 1000 mg via ORAL
  Filled 2016-06-27 (×4): qty 2

## 2016-06-27 MED ORDER — FAMOTIDINE 20 MG PO TABS
20.0000 mg | ORAL_TABLET | ORAL | Status: AC
Start: 2016-06-27 — End: 2016-06-27
  Administered 2016-06-27: 20 mg via ORAL

## 2016-06-27 MED ORDER — PAROXETINE HCL 20 MG PO TABS
40.0000 mg | ORAL_TABLET | Freq: Every day | ORAL | Status: DC
  Administered 2016-06-27 – 2016-06-28 (×2): 40 mg via ORAL
  Filled 2016-06-27 (×2): qty 2

## 2016-06-27 MED ORDER — HYDROCHLOROTHIAZIDE 12.5 MG PO CAPS
12.5000 mg | ORAL_CAPSULE | Freq: Every day | ORAL | Status: DC
  Administered 2016-06-27 – 2016-06-29 (×3): 12.5 mg via ORAL
  Filled 2016-06-27 (×3): qty 1

## 2016-06-27 MED ORDER — PHENOL 1.4 % MT LIQD
1.0000 | OROMUCOSAL | Status: DC | PRN
  Filled 2016-06-27: qty 177

## 2016-06-27 MED ORDER — INSULIN ASPART 100 UNIT/ML ~~LOC~~ SOLN
0.0000 [IU] | Freq: Three times a day (TID) | SUBCUTANEOUS | Status: DC
  Administered 2016-06-28: 3 [IU] via SUBCUTANEOUS
  Administered 2016-06-28 – 2016-06-29 (×2): 2 [IU] via SUBCUTANEOUS
  Administered 2016-06-29: 8 [IU] via SUBCUTANEOUS
  Filled 2016-06-27: qty 8
  Filled 2016-06-27: qty 5
  Filled 2016-06-27: qty 3

## 2016-06-27 MED ORDER — CHLORHEXIDINE GLUCONATE 4 % EX LIQD: 60.0000 mL | Freq: Once | CUTANEOUS | Status: DC

## 2016-06-27 MED ORDER — MAGNESIUM HYDROXIDE 400 MG/5ML PO SUSP
30.0000 mL | Freq: Every day | ORAL | Status: DC | PRN
  Administered 2016-06-29: 30 mL via ORAL
  Filled 2016-06-27: qty 30

## 2016-06-27 MED ORDER — TRAMADOL HCL 50 MG PO TABS
50.0000 mg | ORAL_TABLET | ORAL | Status: DC | PRN
  Administered 2016-06-27 – 2016-06-28 (×4): 100 mg via ORAL
  Filled 2016-06-27 (×4): qty 2

## 2016-06-27 MED ORDER — BISACODYL 10 MG RE SUPP
10.0000 mg | Freq: Every day | RECTAL | Status: DC | PRN
  Administered 2016-06-29: 10 mg via RECTAL
  Filled 2016-06-27: qty 1

## 2016-06-27 MED ORDER — DIPHENHYDRAMINE HCL 12.5 MG/5ML PO ELIX: 12.5000 mg | ORAL_SOLUTION | ORAL | Status: DC | PRN

## 2016-06-27 MED ORDER — PROPOFOL 500 MG/50ML IV EMUL
INTRAVENOUS | Status: DC | PRN
  Administered 2016-06-27: 60 ug/kg/min via INTRAVENOUS

## 2016-06-27 MED ORDER — ACETAMINOPHEN 10 MG/ML IV SOLN
1000.0000 mg | Freq: Four times a day (QID) | INTRAVENOUS | Status: AC
  Administered 2016-06-27 – 2016-06-28 (×4): 1000 mg via INTRAVENOUS
  Filled 2016-06-27 (×4): qty 100

## 2016-06-27 MED ORDER — FENTANYL CITRATE (PF) 100 MCG/2ML IJ SOLN
25.0000 ug | INTRAMUSCULAR | Status: DC | PRN
Start: 2016-06-27 — End: 2016-06-27

## 2016-06-27 MED ORDER — CLINDAMYCIN PHOSPHATE 900 MG/50ML IV SOLN
INTRAVENOUS | Status: AC
  Filled 2016-06-27: qty 50

## 2016-06-27 MED ORDER — BUPIVACAINE HCL (PF) 0.5 % IJ SOLN
INTRAMUSCULAR | Status: DC | PRN
  Administered 2016-06-27: 2 mL

## 2016-06-27 MED ORDER — FAMOTIDINE 20 MG PO TABS
ORAL_TABLET | ORAL | Status: AC
  Filled 2016-06-27: qty 1

## 2016-06-27 MED ORDER — ACETAMINOPHEN 325 MG PO TABS: 650.0000 mg | ORAL_TABLET | Freq: Four times a day (QID) | ORAL | Status: DC | PRN

## 2016-06-27 MED ORDER — NEOMYCIN-POLYMYXIN B GU 40-200000 IR SOLN
Status: AC
  Filled 2016-06-27: qty 20

## 2016-06-27 MED ORDER — ALUM & MAG HYDROXIDE-SIMETH 200-200-20 MG/5ML PO SUSP: 30.0000 mL | ORAL | Status: DC | PRN

## 2016-06-27 MED ORDER — NEOMYCIN-POLYMYXIN B GU 40-200000 IR SOLN
Status: DC | PRN
  Administered 2016-06-27: 14 mL

## 2016-06-27 MED ORDER — SODIUM CHLORIDE 0.9 % IV SOLN
INTRAVENOUS | Status: DC | PRN
  Administered 2016-06-27: 60 mL

## 2016-06-27 MED ORDER — TOPIRAMATE 100 MG PO TABS
100.0000 mg | ORAL_TABLET | Freq: Two times a day (BID) | ORAL | Status: DC
  Administered 2016-06-27 – 2016-06-29 (×4): 100 mg via ORAL
  Filled 2016-06-27 (×4): qty 1

## 2016-06-27 MED ORDER — LISINOPRIL 20 MG PO TABS
20.0000 mg | ORAL_TABLET | Freq: Every day | ORAL | Status: DC
  Administered 2016-06-27 – 2016-06-29 (×3): 20 mg via ORAL
  Filled 2016-06-27 (×3): qty 1

## 2016-06-27 SURGICAL SUPPLY — 62 items
AUTOTRANSFUS HAS 1/8 (MISCELLANEOUS) ×3
BATTERY INSTRU NAVIGATION (MISCELLANEOUS) ×12 IMPLANT
BLADE SAW 1 (BLADE) ×3 IMPLANT
BLADE SAW 1/2 (BLADE) ×3 IMPLANT
BTRY SRG DRVR LF (MISCELLANEOUS) ×4
CANISTER SUCT 1200ML W/VALVE (MISCELLANEOUS) ×3 IMPLANT
CANISTER SUCT 3000ML (MISCELLANEOUS) ×6 IMPLANT
CAPT KNEE TOTAL 3 ATTUNE ×2 IMPLANT
CATH TRAY METER 16FR LF (MISCELLANEOUS) ×3 IMPLANT
CEMENT BONE GENTAMICIN (Cement) ×6 IMPLANT
CEMENT BONE GENTAMICIN PWDR (Cement) ×2 IMPLANT
COOLER POLAR GLACIER W/PUMP (MISCELLANEOUS) ×3 IMPLANT
CUFF TOURN 24 STER (MISCELLANEOUS) IMPLANT
CUFF TOURN 30 STER DUAL PORT (MISCELLANEOUS) ×2 IMPLANT
DRAPE SHEET LG 3/4 BI-LAMINATE (DRAPES) ×3 IMPLANT
DRSG DERMACEA 8X12 NADH (GAUZE/BANDAGES/DRESSINGS) ×3 IMPLANT
DRSG OPSITE POSTOP 4X14 (GAUZE/BANDAGES/DRESSINGS) ×3 IMPLANT
DRSG TEGADERM 4X4.75 (GAUZE/BANDAGES/DRESSINGS) ×3 IMPLANT
DURAPREP 26ML APPLICATOR (WOUND CARE) ×6 IMPLANT
ELECT CAUTERY BLADE 6.4 (BLADE) ×3 IMPLANT
ELECT REM PT RETURN 9FT ADLT (ELECTROSURGICAL) ×3
ELECTRODE REM PT RTRN 9FT ADLT (ELECTROSURGICAL) ×1 IMPLANT
EX-PIN ORTHOLOCK NAV 4X150 (PIN) ×6 IMPLANT
GLOVE BIOGEL M STRL SZ7.5 (GLOVE) ×6 IMPLANT
GLOVE INDICATOR 8.0 STRL GRN (GLOVE) ×3 IMPLANT
GLOVE SURG 9.0 ORTHO LTXF (GLOVE) ×3 IMPLANT
GLOVE SURG ORTHO 9.0 STRL STRW (GLOVE) ×3 IMPLANT
GOWN STRL REUS W/ TWL LRG LVL3 (GOWN DISPOSABLE) ×2 IMPLANT
GOWN STRL REUS W/TWL 2XL LVL3 (GOWN DISPOSABLE) ×3 IMPLANT
GOWN STRL REUS W/TWL LRG LVL3 (GOWN DISPOSABLE) ×6
HANDPIECE INTERPULSE COAX TIP (DISPOSABLE) ×3
HOLDER FOLEY CATH W/STRAP (MISCELLANEOUS) ×3 IMPLANT
HOOD PEEL AWAY FLYTE STAYCOOL (MISCELLANEOUS) ×6 IMPLANT
KIT RM TURNOVER STRD PROC AR (KITS) ×3 IMPLANT
KNIFE SCULPS 14X20 (INSTRUMENTS) ×3 IMPLANT
LABEL OR SOLS (LABEL) ×3 IMPLANT
NDL SAFETY 18GX1.5 (NEEDLE) ×3 IMPLANT
NDL SPNL 20GX3.5 QUINCKE YW (NEEDLE) ×1 IMPLANT
NEEDLE SPNL 20GX3.5 QUINCKE YW (NEEDLE) ×3 IMPLANT
NS IRRIG 500ML POUR BTL (IV SOLUTION) ×3 IMPLANT
PACK TOTAL KNEE (MISCELLANEOUS) ×3 IMPLANT
PAD WRAPON POLAR KNEE (MISCELLANEOUS) ×1 IMPLANT
PIN FIXATION 1/8DIA X 3INL (PIN) ×3 IMPLANT
SET HNDPC FAN SPRY TIP SCT (DISPOSABLE) ×1 IMPLANT
SOL .9 NS 3000ML IRR  AL (IV SOLUTION) ×2
SOL .9 NS 3000ML IRR AL (IV SOLUTION) ×1
SOL .9 NS 3000ML IRR UROMATIC (IV SOLUTION) ×1 IMPLANT
SOL PREP PVP 2OZ (MISCELLANEOUS) ×3
SOLUTION PREP PVP 2OZ (MISCELLANEOUS) ×1 IMPLANT
SPONGE DRAIN TRACH 4X4 STRL 2S (GAUZE/BANDAGES/DRESSINGS) ×3 IMPLANT
STAPLER SKIN PROX 35W (STAPLE) ×3 IMPLANT
SUCTION FRAZIER HANDLE 10FR (MISCELLANEOUS) ×2
SUCTION TUBE FRAZIER 10FR DISP (MISCELLANEOUS) ×1 IMPLANT
SUT VIC AB 0 CT1 36 (SUTURE) ×3 IMPLANT
SUT VIC AB 1 CT1 36 (SUTURE) ×6 IMPLANT
SUT VIC AB 2-0 CT2 27 (SUTURE) ×3 IMPLANT
SYR 20CC LL (SYRINGE) ×3 IMPLANT
SYR 30ML LL (SYRINGE) ×6 IMPLANT
SYSTEM AUTOTRANSFUS DUAL TROCR (MISCELLANEOUS) ×1 IMPLANT
TOWEL OR 17X26 4PK STRL BLUE (TOWEL DISPOSABLE) ×3 IMPLANT
TOWER CARTRIDGE SMART MIX (DISPOSABLE) ×3 IMPLANT
WRAPON POLAR PAD KNEE (MISCELLANEOUS) ×3

## 2016-06-27 NOTE — Progress Notes (Signed)
Capillary blood sugar upon arrival was 65. Dr. Rosey Bath notified and ordered 1/2 amp D50 which was given. Recheck blood sugar is 106.

## 2016-06-27 NOTE — Brief Op Note (Signed)
06/27/2016  3:38 PM  PATIENT:  Patrick Turner  70 y.o. male  PRE-OPERATIVE DIAGNOSIS:  PRIMARY OSTEOARTHRITIS of the right knee  POST-OPERATIVE DIAGNOSIS:  Same  PROCEDURE:  Procedure(s): COMPUTER ASSISTED TOTAL KNEE ARTHROPLASTY (Right)  SURGEON:  Surgeon(s) and Role:    * Dereck Leep, MD - Primary  ASSISTANTS: Vance Peper, PA   ANESTHESIA:   spinal  EBL:  Total I/O In: 1000 [I.V.:1000] Out: 300 [Urine:250; Blood:50]  BLOOD ADMINISTERED:none  DRAINS: 2 medium drains to a reinfusion system   LOCAL MEDICATIONS USED:  MARCAINE    and OTHER Exparel  SPECIMEN:  No Specimen  DISPOSITION OF SPECIMEN:  N/A  COUNTS:  YES  TOURNIQUET:   97 minutes  DICTATION: .Dragon Dictation  PLAN OF CARE: Admit to inpatient   PATIENT DISPOSITION:  PACU - hemodynamically stable.   Delay start of Pharmacological VTE agent (>24hrs) due to surgical blood loss or risk of bleeding: yes

## 2016-06-27 NOTE — H&P (Signed)
The patient has been re-examined, and the chart reviewed, and there have been no interval changes to the documented history and physical.    The risks, benefits, and alternatives have been discussed at length. The patient expressed understanding of the risks benefits and agreed with plans for surgical intervention.  Janifer Gieselman P. Lydon Vansickle, Jr. M.D.    

## 2016-06-27 NOTE — Anesthesia Procedure Notes (Signed)
Date/Time: 06/27/2016 12:14 PM Performed by: Johnna Acosta Pre-anesthesia Checklist: Patient identified, Emergency Drugs available, Suction available, Patient being monitored and Timeout performed Patient Re-evaluated:Patient Re-evaluated prior to inductionOxygen Delivery Method: Simple face mask

## 2016-06-27 NOTE — Progress Notes (Signed)
Pt tolerated sitting on side of bed. No complaint of pain. dag dry and intact.

## 2016-06-27 NOTE — Anesthesia Preprocedure Evaluation (Signed)
Anesthesia Evaluation  Patient identified by MRN, date of birth, ID band Patient awake    Reviewed: Allergy & Precautions, H&P , NPO status , Patient's Chart, lab work & pertinent test results, reviewed documented beta blocker date and time   History of Anesthesia Complications Negative for: history of anesthetic complications  Airway Mallampati: II  TM Distance: >3 FB Neck ROM: full    Dental no notable dental hx. (+) Missing, Caps, Poor Dentition   Pulmonary neg shortness of breath, sleep apnea , neg COPD, neg recent URI,    Pulmonary exam normal breath sounds clear to auscultation       Cardiovascular Exercise Tolerance: Good hypertension, On Medications (-) angina(-) CAD, (-) Past MI, (-) Cardiac Stents and (-) CABG Normal cardiovascular exam(-) dysrhythmias + Valvular Problems/Murmurs  Rhythm:regular Rate:Normal     Neuro/Psych PSYCHIATRIC DISORDERS (Depression) negative neurological ROS     GI/Hepatic Neg liver ROS, GERD  ,  Endo/Other  diabetes  Renal/GU CRFRenal disease  negative genitourinary   Musculoskeletal   Abdominal   Peds  Hematology  (+) Blood dyscrasia, anemia ,   Anesthesia Other Findings Past Medical History: No date: Altered mental status No date: Anxiety No date: Arthritis No date: CRD (chronic renal disease) No date: Depression No date: Diabetes mellitus without complication (HCC) No date: GERD (gastroesophageal reflux disease) No date: Hypercholesteremia No date: Hypertension No date: Neuropathy ([place]) No date: Sleep apnea No date: Sleep terror     Comment: per patient this year per patient    Reproductive/Obstetrics negative OB ROS                             Anesthesia Physical Anesthesia Plan  ASA: III  Anesthesia Plan: Spinal   Post-op Pain Management:    Induction:   Airway Management Planned:   Additional Equipment:   Intra-op Plan:    Post-operative Plan:   Informed Consent: I have reviewed the patients History and Physical, chart, labs and discussed the procedure including the risks, benefits and alternatives for the proposed anesthesia with the patient or authorized representative who has indicated his/her understanding and acceptance.   Dental Advisory Given  Plan Discussed with: Anesthesiologist, CRNA and Surgeon  Anesthesia Plan Comments:         Anesthesia Quick Evaluation

## 2016-06-27 NOTE — Progress Notes (Signed)
CSW received consult for possible SNF placement. PT is currently pending CSW will await PT's recommendation to determine patient's discharge needs.  Ernest Pine, MSW, LCSW, [place] Clinical Social Worker [phone]

## 2016-06-27 NOTE — Anesthesia Procedure Notes (Signed)
Spinal  Patient location during procedure: OR Start time: 06/27/2016 12:00 PM End time: 06/27/2016 12:10 PM Staffing Anesthesiologist: Martha Clan Resident/CRNA: Johnna Acosta Performed: resident/CRNA  Preanesthetic Checklist Completed: patient identified, site marked, surgical consent, pre-op evaluation, timeout performed, IV checked, risks and benefits discussed and monitors and equipment checked Spinal Block Patient position: sitting Prep: ChloraPrep Patient monitoring: heart rate, continuous pulse ox, blood pressure and cardiac monitor Approach: midline Location: L4-5 Injection technique: single-shot Needle Needle type: [place]acre and Introducer  Needle gauge: 25 G Needle length: 12.7 cm Additional Notes Negative paresthesia. Negative blood return. Positive free-flowing CSF. Expiration date of kit checked and confirmed. Patient tolerated procedure well, without complications.

## 2016-06-27 NOTE — Op Note (Signed)
OPERATIVE NOTE  DATE OF SURGERY:  06/27/2016  PATIENT NAME:  FAVOUR COTA   DOB: 11-Oct-1945  MRN: [id number]  PRE-OPERATIVE DIAGNOSIS: Degenerative arthrosis of the right knee, primary  POST-OPERATIVE DIAGNOSIS:  Same  PROCEDURE:  Right total knee arthroplasty using computer-assisted navigation  SURGEON:  Marciano Sequin. M.D.  ASSISTANT:  Vance Peper, PA (present and scrubbed throughout the case, critical for assistance with exposure, retraction, instrumentation, and closure)  ANESTHESIA: spinal  ESTIMATED BLOOD LOSS: 50 mL  FLUIDS REPLACED: 1000 mL of crystalloid  TOURNIQUET TIME: 97 minutes  DRAINS: 2 medium drains to a reinfusion system  SOFT TISSUE RELEASES: Anterior cruciate ligament, posterior cruciate ligament, deep medial collateral ligament, patellofemoral ligament  IMPLANTS UTILIZED: DePuy Attune size 7 posterior stabilized femoral component (cemented), size 6 rotating platform tibial component (cemented), 38 mm medialized dome patella (cemented), and a 5 mm stabilized rotating platform polyethylene insert.  INDICATIONS FOR SURGERY: ANTAWON MAYHUGH is a 70 y.o. year old male with a long history of progressive knee pain. X-rays demonstrated severe degenerative changes in tricompartmental fashion. The patient had not seen any significant improvement despite conservative nonsurgical intervention. After discussion of the risks and benefits of surgical intervention, the patient expressed understanding of the risks benefits and agree with plans for total knee arthroplasty.   The risks, benefits, and alternatives were discussed at length including but not limited to the risks of infection, bleeding, nerve injury, stiffness, blood clots, the need for revision surgery, cardiopulmonary complications, among others, and they were willing to proceed.  PROCEDURE IN DETAIL: The patient was brought into the operating room and, after adequate spinal anesthesia was achieved, a  tourniquet was placed on the patient's upper thigh. The patient's knee and leg were cleaned and prepped with alcohol and DuraPrep and draped in the usual sterile fashion. A "timeout" was performed as per usual protocol. The lower extremity was exsanguinated using an Esmarch, and the tourniquet was inflated to 300 mmHg. An anterior longitudinal incision was made followed by a standard mid vastus approach. The deep fibers of the medial collateral ligament were elevated in a subperiosteal fashion off of the medial flare of the tibia so as to maintain a continuous soft tissue sleeve. The patella was subluxed laterally and the patellofemoral ligament was incised. Inspection of the knee demonstrated severe degenerative changes with full-thickness loss of articular cartilage. Osteophytes were debrided using a rongeur. Anterior and posterior cruciate ligaments were excised. Two 4.0 mm Schanz pins were inserted in the femur and into the tibia for attachment of the array of trackers used for computer-assisted navigation. Hip center was identified using a circumduction technique. Distal landmarks were mapped using the computer. The distal femur and proximal tibia were mapped using the computer. The distal femoral cutting guide was positioned using computer-assisted navigation so as to achieve a 5 distal valgus cut. The femur was sized and it was felt that a size 7 femoral component was appropriate. A size 7 femoral cutting guide was positioned and the anterior cut was performed and verified using the computer. This was followed by completion of the posterior and chamfer cuts. Femoral cutting guide for the central box was then positioned in the center box cut was performed.  Attention was then directed to the proximal tibia. Medial and lateral menisci were excised. The extramedullary tibial cutting guide was positioned using computer-assisted navigation so as to achieve a 0 varus-valgus alignment and 3 posterior slope. The  cut was performed and verified using the computer.  The proximal tibia was sized and it was felt that a size 7 tibial tray was appropriate. Tibial and femoral trials were inserted followed by insertion of a 5 mm polyethylene insert. This allowed for excellent mediolateral soft tissue balancing both in flexion and in full extension. Finally, the patella was cut and prepared so as to accommodate a 38 mm medialized dome patella. A patella trial was placed and the knee was placed through a range of motion with excellent patellar tracking appreciated. The femoral trial was removed after debridement of posterior osteophytes. The central post-hole for the tibial component was reamed followed by insertion of a keel punch. Tibial trials were then removed. Cut surfaces of bone were irrigated with copious amounts of normal saline with antibiotic solution using pulsatile lavage and then suctioned dry. Polymethylmethacrylate cement with gentamicin was prepared in the usual fashion using a vacuum mixer. Cement was applied to the cut surface of the proximal tibia as well as along the undersurface of a size 6 rotating platform tibial component. Tibial component was positioned and impacted into place. Excess cement was removed using [employer] fast streamer. Cement was then applied to the cut surfaces of the femur as well as along the posterior flanges of the size 7 femoral component. The femoral component was positioned and impacted into place. Excess cement was removed using [employer] fast streamer. A 5 mm polyethylene trial was inserted and the knee was brought into full extension with steady axial compression applied. Finally, cement was applied to the backside of a 38 mm medialized dome patella and the patellar component was positioned and patellar clamp applied. Excess cement was removed using [employer] fast streamer. After adequate curing of the cement, the tourniquet was deflated after a total tourniquet time of 97 minutes. Hemostasis was achieved  using electrocautery. The knee was irrigated with copious amounts of normal saline with antibiotic solution using pulsatile lavage and then suctioned dry. 20 mL of 1.3% Exparel and 60 mL of 0.25% Marcaine in 40 mL of normal saline was injected along the posterior capsule, medial and lateral gutters, and along the arthrotomy site. A 5 mm stabilized rotating platform polyethylene insert was inserted and the knee was placed through a range of motion with excellent mediolateral soft tissue balancing appreciated and excellent patellar tracking noted. 2 medium drains were placed in the wound bed and brought out through separate stab incisions to be attached to a reinfusion system. The medial parapatellar portion of the incision was reapproximated using interrupted sutures of #1 Vicryl. Subcutaneous tissue was approximated in layers using first #0 Vicryl followed #2-0 Vicryl. The skin was approximated with skin staples. A sterile dressing was applied.  The patient tolerated the procedure well and was transported to the recovery room in stable condition.    Zamora Colton P. Holley Bouche., M.D.

## 2016-06-27 NOTE — Transfer of Care (Signed)
Immediate Anesthesia Transfer of Care Note  Patient: Patrick Turner  Procedure(s) Performed: Procedure(s): COMPUTER ASSISTED TOTAL KNEE ARTHROPLASTY (Right)  Patient Location: PACU  Anesthesia Type:Spinal  Level of Consciousness: awake and alert   Airway & Oxygen Therapy: Patient Spontanous Breathing and Patient connected to face mask oxygen  Post-op Assessment: Report given to RN and Post -op Vital signs reviewed and stable  Post vital signs: Reviewed and stable  Last Vitals:  Vitals:   06/27/16 1003 06/27/16 1540  BP: (!) 158/57 131/64  Pulse: 62 (!) 54  Resp: 16 (!) 8  Temp: 36.6 C 37.3 C    Last Pain:  Vitals:   06/27/16 1540  TempSrc: Tympanic  PainSc:          Complications: No apparent anesthesia complications

## 2016-06-28 ENCOUNTER — Encounter: Payer: Self-pay | Admitting: Orthopedic Surgery

## 2016-06-28 LAB — BASIC METABOLIC PANEL
ANION GAP: 5 (ref 5–15)
BUN: 23 mg/dL — ABNORMAL HIGH (ref 6–20)
CALCIUM: 7.9 mg/dL — AB (ref 8.9–10.3)
CO2: 20 mmol/L — AB (ref 22–32)
Chloride: 111 mmol/L (ref 101–111)
Creatinine, Ser: 1.1 mg/dL (ref 0.61–1.24)
GLUCOSE: 159 mg/dL — AB (ref 65–99)
POTASSIUM: 4 mmol/L (ref 3.5–5.1)
Sodium: 136 mmol/L (ref 135–145)

## 2016-06-28 LAB — GLUCOSE, CAPILLARY
GLUCOSE-CAPILLARY: 135 mg/dL — AB (ref 65–99)
GLUCOSE-CAPILLARY: 138 mg/dL — AB (ref 65–99)
Glucose-Capillary: 113 mg/dL — ABNORMAL HIGH (ref 65–99)
Glucose-Capillary: 192 mg/dL — ABNORMAL HIGH (ref 65–99)

## 2016-06-28 LAB — CBC
HEMATOCRIT: 30.9 % — AB (ref 40.0–52.0)
Hemoglobin: 10.4 g/dL — ABNORMAL LOW (ref 13.0–18.0)
MCH: 31.1 pg (ref 26.0–34.0)
MCHC: 33.6 g/dL (ref 32.0–36.0)
MCV: 92.7 fL (ref 80.0–100.0)
Platelets: 172 10*3/uL (ref 150–440)
RBC: 3.34 MIL/uL — AB (ref 4.40–5.90)
RDW: 14.3 % (ref 11.5–14.5)
WBC: 6.8 10*3/uL (ref 3.8–10.6)

## 2016-06-28 MED ORDER — ASPIRIN 325 MG PO TABS
325.0000 mg | ORAL_TABLET | Freq: Every day | ORAL | Status: DC
  Administered 2016-06-28 – 2016-06-29 (×2): 325 mg via ORAL
  Filled 2016-06-28: qty 1

## 2016-06-28 MED ORDER — ATORVASTATIN CALCIUM 20 MG PO TABS: 40.0000 mg | ORAL_TABLET | Freq: Every day | ORAL | Status: DC

## 2016-06-28 NOTE — Care Management (Signed)
Received callback from patient's wife. I explained my conversation with patient and my concern. Wife states that for about a year now patient has had a "hard time saying what he is thinking to the point he has talked to his PCP Dr. Ouida Sills". She states patient is typically independent at home. He has no DME at home and agrees to this [facility] obtaining one. She denies need for bedside commode or shower chair. She is aware that Lovenox has been called in for price check and that she should not pick up this medications until we know for sure he is returning home. RNCM will continue to follow.

## 2016-06-28 NOTE — Evaluation (Addendum)
Physical Therapy Evaluation Patient Details Name: Patrick Turner MRN: [id number] DOB: Jul 08, 1946 Today's Date: 06/28/2016   History of Present Illness  Pt admitted for R TKR.   Clinical Impression  Pt is a pleasant 70 year old male who was admitted for R TKR. Pt performs bed mobility with min assist, transfers with cga, and ambulation with cga and RW. Pt very motivated to perform therapy and eager to walk. Pt demonstrates deficits with strength/pain/endurance/balance. Able to perform SLRs with supervision, no need for KI at this time. Slight word finding difficulty noted during questioning, however inconsistent. No neuro deficits noted. Discussed with CM, medical consult ordered. Would benefit from skilled PT to address above deficits and promote optimal return to PLOF. Recommend transition to [place] upon discharge from acute hospitalization.       Follow Up Recommendations Home health PT;Supervision for mobility/OOB    Equipment Recommendations  Rolling walker with 5" wheels (youth)    Recommendations for Other Services       Precautions / Restrictions Precautions Precautions: Fall Restrictions Weight Bearing Restrictions: Yes RLE Weight Bearing: Weight bearing as tolerated      Mobility  Bed Mobility Overal bed mobility: Needs Assistance Bed Mobility: Supine to Sit     Supine to sit: Min assist     General bed mobility comments: assist for sliding R LE out towards EOB. Cues for sequencing given. Once seated at EOB, pt able to sit with independence  Transfers Overall transfer level: Needs assistance Equipment used: Rolling walker (2 wheeled) Transfers: Sit to/from Stand Sit to Stand: Min guard         General transfer comment: cues for pushing from seated surface. Once standing, pt able to stand with supervision. Heavy use of B UE on RW during standing.  Ambulation/Gait Ambulation/Gait assistance: Min guard Ambulation Distance (Feet): 40 Feet Assistive device:  Rolling walker (2 wheeled) Gait Pattern/deviations: Step-to pattern     General Gait Details: ambulated in room with step to gait pattern. Short steps noted with 2 LOB towards R side. Pt able to self recover and admits to stepping on foot wrong. Cues given for being closer to RW to prevent LOB.  Stairs            Wheelchair Mobility    Modified Rankin (Stroke Patients Only)       Balance Overall balance assessment: History of Falls;Needs assistance Sitting-balance support: Feet supported Sitting balance-Leahy Scale: Good     Standing balance support: Bilateral upper extremity supported Standing balance-Leahy Scale: Good                               Pertinent Vitals/Pain Pain Assessment: 0-10 Pain Score: 5  Pain Location: R knee Pain Descriptors / Indicators: Operative site guarding Pain Intervention(s): Limited activity within patient's tolerance;Premedicated before session;Ice applied    Home Living Family/patient expects to be discharged to:: Private residence Living Arrangements: Spouse/significant other Available Help at Discharge: Family Type of Home: House Home Access: Stairs to enter Entrance Stairs-Rails: None Technical brewer of Steps: 3 Home Layout: Two level;Bed/bath upstairs Home Equipment: Cane - single point Additional Comments: cane was his father's    Prior Function Level of Independence: Independent with assistive device(s)         Comments: Pt occasionally used SPC     Hand Dominance        Extremity/Trunk Assessment   Upper Extremity Assessment: Overall WFL for tasks assessed  Lower Extremity Assessment: Generalized weakness (R LE grossly 3/5; L LE grossly 5/5)         Communication   Communication: No difficulties  Cognition Arousal/Alertness: Awake/alert Behavior During Therapy: WFL for tasks assessed/performed Overall Cognitive Status: Within Functional Limits for tasks assessed                       General Comments      Exercises Total Joint Exercises Goniometric ROM: R knee AAROM 0-73 degrees Other Exercises Other Exercises: Supine ther-ex performed including R LE ankle pumps, quad sets, SLRs, hip abd/add, and seated knee flexion stretches. All ther-ex performed x 10 reps with min assist and cues for correct technique.   Assessment/Plan    PT Assessment Patient needs continued PT services  PT Problem List Decreased strength;Decreased balance;Decreased mobility;Pain          PT Treatment Interventions DME instruction;Gait training;Therapeutic exercise    PT Goals (Current goals can be found in the Care Plan section)  Acute Rehab PT Goals Patient Stated Goal: to get stronger PT Goal Formulation: With patient Time For Goal Achievement: 07/12/16 Potential to Achieve Goals: Good    Frequency BID   Barriers to discharge        Co-evaluation               End of Session Equipment Utilized During Treatment: Gait belt Activity Tolerance: Patient tolerated treatment well Patient left: in chair;with chair alarm set Nurse Communication: Mobility status         Time: 0922-0950 PT Time Calculation (min) (ACUTE ONLY): 28 min   Charges:   PT Evaluation $PT Eval Moderate Complexity: 1 Procedure PT Treatments $Therapeutic Exercise: 8-22 mins   PT G Codes:        Velvet Moomaw 07-18-16, 11:17 AM  [name], PT, DPT [phone]

## 2016-06-28 NOTE — Progress Notes (Signed)
Physical Therapy Treatment Patient Details Name: Patrick Turner MRN: [id number] DOB: Nov 06, 1945 Today's Date: 06/28/2016    History of Present Illness Pt admitted for R TKR.     PT Comments    Pt is making good progress towards goals with increased ambulation this date. Pt very motivated to perform therapy and does well with there-ex. Still unable to perform reciprocal gait pattern, with heavy WB through B UE. Pt demonstrates good endurance and no LOB during this session. Plan to ambulate full loop next session.  Follow Up Recommendations  Home health PT;Supervision for mobility/OOB     Equipment Recommendations   (youth RW)    Recommendations for Other Services       Precautions / Restrictions Precautions Precautions: Fall;Knee Precaution Booklet Issued: No Restrictions Weight Bearing Restrictions: Yes RLE Weight Bearing: Weight bearing as tolerated    Mobility  Bed Mobility Overal bed mobility: Needs Assistance Bed Mobility: Supine to Sit     Supine to sit: Min guard     General bed mobility comments: Improved technique performed this session with pt able to initiate sliding L LE off of bed. CGA used to hold L LE and guide down to floor. Use of bedrail to assist trunk support. Once seated at EOB, able to sit with supervision  Transfers Overall transfer level: Needs assistance Equipment used: Rolling walker (2 wheeled) Transfers: Sit to/from Stand Sit to Stand: Min guard         General transfer comment: cues for pushing from seated surface. Once standing, pt able to stand with supervision. Heavy use of B UE on RW during standing.  Ambulation/Gait Ambulation/Gait assistance: Min guard Ambulation Distance (Feet): 80 Feet Assistive device: Rolling walker (2 wheeled) Gait Pattern/deviations: Step-to pattern     General Gait Details: ambulated with short step to gait pattern. Pt cued for upright posture, however heavy WB noted through B UE. May benefit from  youth RW for continued performance. Unable to perform reciprocal gait pattern at this time, however begins to take larger step on L LE.   Stairs            Wheelchair Mobility    Modified Rankin (Stroke Patients Only)       Balance Overall balance assessment: History of Falls;Needs assistance Sitting-balance support: Feet supported Sitting balance-Leahy Scale: Good     Standing balance support: Bilateral upper extremity supported Standing balance-Leahy Scale: Good                      Cognition Arousal/Alertness: Awake/alert Behavior During Therapy: WFL for tasks assessed/performed Overall Cognitive Status: Within Functional Limits for tasks assessed       Memory: Decreased short-term memory              Exercises Total Joint Exercises Goniometric ROM: R knee AAROM 0-73 degrees Other Exercises Other Exercises: Supine ther-ex performed on R LE including ankle pumps, quad sets, SLR, hip abd/add, and SAQ. All ther-ex performed x 10 reps with cga. Cues given for correct technique    General Comments        Pertinent Vitals/Pain Pain Assessment: 0-10 Pain Score: 3  Pain Location: R knee Pain Descriptors / Indicators: Operative site guarding Pain Intervention(s): Limited activity within patient's tolerance;Patient requesting pain meds-RN notified;RN gave pain meds during session;Ice applied    Home Living Family/patient expects to be discharged to:: Private residence Living Arrangements: Spouse/significant other Available Help at Discharge: Family Type of Home: House (pt plans to live upstairs  and his wife works full time at [employer] with difficulty getting time off work to help him) Home Access: Stairs to enter Chiropractor: None Home Layout: Two level;Bed/bath upstairs Home Equipment: Kasandra Knudsen - single point;Adaptive equipment Additional Comments: cane was his father's    Prior Function Level of Independence: Independent  with assistive device(s)      Comments: Pt occasionally used SPC but was independent in ADLs   PT Goals (current goals can now be found in the care plan section) Acute Rehab PT Goals Patient Stated Goal: "to regain my strength again" PT Goal Formulation: With patient Time For Goal Achievement: 07/12/16 Potential to Achieve Goals: Good Additional Goals Additional Goal #1: Pt will be able to perform bed mobility/transfers with supervision and use of RW to improve functional independence Progress towards PT goals: Progressing toward goals    Frequency    BID      PT Plan Current plan remains appropriate    Co-evaluation             End of Session Equipment Utilized During Treatment: Gait belt Activity Tolerance: Patient tolerated treatment well Patient left: in bed;with bed alarm set     Time: 1316-1345 PT Time Calculation (min) (ACUTE ONLY): 29 min  Charges:  $Gait Training: 8-22 mins $Therapeutic Exercise: 8-22 mins                    G Codes:      Patrick Turner 26-Jul-2016, 2:29 PM  Patrick Turner, PT, DPT [phone]

## 2016-06-28 NOTE — Progress Notes (Signed)
ORTHOPAEDICS PROGRESS NOTE  PATIENT NAME: Patrick Turner DOB: 07/30/1946  MRN: [id number]  POD # 1: Right total knee arthroplasty  Subjective: The patient was resting comfortably this morning. He denies any nausea. He reports pain to be well controlled.  Objective: Vital signs in last 24 hours: Temp:  [97.5 F (36.4 C)-99.2 F (37.3 C)] 98.8 F (37.1 C) (10/17 0454) Pulse Rate:  [54-70] 66 (10/17 0454) Resp:  [0-25] 18 (10/17 0454) BP: (104-158)/(38-75) 104/38 (10/17 0454) SpO2:  [97 %-100 %] 98 % (10/17 0454) Weight:  [91.6 kg (202 lb)] 91.6 kg (202 lb) (10/16 1003)  Intake/Output from previous day: 10/16 0701 - 10/17 0700 In: 2820 [P.O.:740; I.V.:1930; IV Piggyback:150] Out: 1600 [Urine:1250; Drains:300; Blood:50]   Recent Labs  06/27/16 1801 06/28/16 0411  WBC 7.1 6.8  HGB 11.9* 10.4*  HCT 36.8* 30.9*  PLT 199 172  K  --  4.0  CL  --  111  CO2  --  20*  BUN  --  23*  CREATININE  --  1.10  GLUCOSE  --  159*  CALCIUM  --  7.9*    EXAM General: Awake, alert, and oriented. Lungs: clear to auscultation Cardiac: normal rate, regular rhythm, normal S1, S2, no murmurs, rubs, clicks or gallops Abdomen: Active bowel sounds. Soft, nontender, nondistended. Right lower extremity: Right knee dressing is dry and intact. Hemovac drain is in place. The patient is able to perform independent straight leg raise. Homans test is negative. Neurologic: Sensory and motor function is intact.  Assessment: Right total knee arthroplasty  Secondary diagnoses: Acute blood loss anemia superimposed on chronic anemia Sleep apnea Neuropathy Hypertension Hypercholesterolemia Gastroesophageal reflux disease Diabetes Depression Chronic renal disease  Plan: Today's goal were reviewed with the patient. Begin physical therapy and occupational therapy as per total knee arthroplasty protocol. Repeat labs in the morning. Continue sliding-scale insulin. Plan is to go Home after  hospital stay. DVT Prophylaxis - Lovenox, Foot Pumps and TED hose  Goldie Dimmer P. Holley Bouche M.D.

## 2016-06-28 NOTE — Evaluation (Signed)
Occupational Therapy Evaluation Patient Details Name: Patrick Turner MRN: [id number] DOB: 1946-08-08 Today's Date: 06/28/2016    History of Present Illness Pt admitted for R TKR.    Clinical Impression   Pt is 70 year old male s/p R TKR.  Pt was independent in all ADLs prior to surgery and is eager to return to PLOF.  Pt currently requires minimal assist and mod cues for LB dressing while in seated position due to pain and limited AROM of R knee.  He presents with word finding problems, decreased memory skills and stuttering speech pattern which patient states has been present for "as long as he can remember". He does not present with any other symptoms of a stroke (no facial droop or one sided weakness, vision changes, etc). Pt would benefit from instruction in dressing techniques with or without assistive devices for dressing and bathing skills.  Pt would also benefit from recommendations for home modifications to increase safety in the bathroom and prevent falls. He presents with decreased independence in ADLs and balance skills and would benefit from SNF for rehab since his wife works full time and he will be staying on the second floor where the bedroom and bathrooms are.     Follow Up Recommendations  SNF    Equipment Recommendations  Tub/shower bench    Recommendations for Other Services       Precautions / Restrictions Precautions Precautions: Fall Restrictions Weight Bearing Restrictions: Yes RLE Weight Bearing: Weight bearing as tolerated      Mobility Bed Mobility Overal bed mobility: Needs Assistance Bed Mobility: Supine to Sit     Supine to sit: Min assist     General bed mobility comments: assist for sliding R LE out towards EOB. Cues for sequencing given. Once seated at EOB, pt able to sit with independence  Transfers Overall transfer level: Needs assistance Equipment used: Rolling walker (2 wheeled) Transfers: Sit to/from Stand Sit to Stand: Min  guard         General transfer comment: cues for pushing from seated surface. Once standing, pt able to stand with supervision. Heavy use of B UE on RW during standing.    Balance Overall balance assessment: History of Falls;Needs assistance Sitting-balance support: Feet supported Sitting balance-Leahy Scale: Good     Standing balance support: Bilateral upper extremity supported Standing balance-Leahy Scale: Good                              ADL Overall ADL's : Needs assistance/impaired Eating/Feeding: Independent;Set up   Grooming: Wash/dry hands;Wash/dry face;Oral care;Applying deodorant;Brushing hair;Independent;Set up           Upper Body Dressing : Independent;Set up   Lower Body Dressing: Minimal assistance;With adaptive equipment;Set up;Cueing for sequencing                       Vision     Perception     Praxis      Pertinent Vitals/Pain Pain Assessment: 0-10 Pain Score: 3  Pain Location: R knee Pain Descriptors / Indicators: Operative site guarding;Constant Pain Intervention(s): Limited activity within patient's tolerance;Premedicated before session;Ice applied;Monitored during session     Hand Dominance Right   Extremity/Trunk Assessment Upper Extremity Assessment Upper Extremity Assessment: Overall WFL for tasks assessed   Lower Extremity Assessment Lower Extremity Assessment: Defer to PT evaluation       Communication Communication Communication:  (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 All ther-ex performed x 10 reps with min assist and cues for correct technique.   Shoulder Instructions      Home Living Family/patient expects to be discharged to:: Private residence Living Arrangements: Spouse/significant other Available Help at Discharge: Family Type of Home: House (pt plans to live upstairs and his wife works full time at [employer] with difficulty getting time off work to help him) Home Access: Stairs to enter Technical brewer of Steps: 3 Entrance Stairs-Rails: None Home Layout: Two level;Bed/bath upstairs Alternate Level Stairs-Number of Steps: approximately 10-12 Alternate Level Stairs-Rails: Right Bathroom Shower/Tub: Tub/shower unit Shower/tub characteristics: Door Biochemist, clinical: Standard Bathroom Accessibility: Yes How Accessible: Accessible via walker Home Equipment: Cane - single point;Adaptive equipment Adaptive Equipment: Reacher Additional Comments: cane was his father's      Prior Functioning/Environment Level of Independence: Independent with assistive device(s)        Comments: Pt occasionally used SPC but was independent in ADLs        OT Problem List: Decreased strength;Decreased range of motion;Impaired balance (sitting and/or standing);Decreased safety awareness;Pain;Decreased activity tolerance   OT Treatment/Interventions: Self-care/ADL training;DME and/or AE instruction;Patient/family education;Balance training    OT Goals(Current goals can be found in the care plan section) Acute Rehab OT Goals Patient Stated Goal: "to regain my strength again" OT Goal Formulation: With patient Time For Goal Achievement: 07/12/16 Potential to Achieve Goals: Good ADL Goals Pt Will Perform Lower Body Dressing: with supervision;with adaptive equipment;sit to/from stand (with ot without AD in sitting position) Pt Will Transfer to Toilet: with  supervision;stand pivot transfer;regular height toilet (using FWW)  OT Frequency: Min 1X/week   Barriers to D/C:    concerned about his wife working full time and only has grand daughters checking in on him and he will be upstairs        Co-evaluation              End of Session Nurse Communication:  (spoke to [place], CNA, that word finding problems and studdering are long standing problem and takes anxiety meds to help with it)  Activity Tolerance: Patient limited by pain Patient left: in chair;with call bell/phone within reach;with chair alarm set   Time: 1036-1110 OT Time Calculation (min): 34 min Charges:  OT General Charges $OT Visit: 1 Procedure OT Evaluation $OT Eval Low Complexity: 1 Procedure OT Treatments $Self Care/Home Management : 8-22 mins G-Codes:      Chrys Racer, OTR/L ascom [phone] 06/28/16, 12:57 PM

## 2016-06-28 NOTE — Care Management (Addendum)
I met with patient regarding discharge planning. PT stated that he was "having problems with her words when he tried to ask for a bed pan". When I met with him, I asked him about his help in the home and he denied having anyone at home. I asked him again about his wife and he said "no" to having a wife at home. I asked who would be staying with him at home and he said "his wife". I spoke with Dr. Marry Guan and he is requesting a medical consult as this seems to be something new for patient. Lovenox '40mg'$  #14 called in to [facility] [address]. For price. List of home health agencies left with patient. I will follow up with wife. Message left for patient's wife to call this RNCM. Youth front-wheeled walker requested from [facility].

## 2016-06-28 NOTE — Anesthesia Postprocedure Evaluation (Signed)
Anesthesia Post Note  Patient: Patrick Turner  Procedure(s) Performed: Procedure(s) (LRB): COMPUTER ASSISTED TOTAL KNEE ARTHROPLASTY (Right)  Patient location during evaluation: Nursing Unit Anesthesia Type: Spinal Level of consciousness: awake and alert and oriented Pain management: satisfactory to patient Vital Signs Assessment: post-procedure vital signs reviewed and stable Respiratory status: respiratory function stable Cardiovascular status: stable Postop Assessment: no headache, no backache, spinal receding, adequate PO intake, no signs of nausea or vomiting and patient able to bend at knees Anesthetic complications: no    Last Vitals:  Vitals:   06/27/16 2230 06/28/16 0454  BP: (!) 138/54 (!) 104/38  Pulse: 70 66  Resp: 19 18  Temp: 36.8 C 37.1 C    Last Pain:  Vitals:   06/27/16 2230  TempSrc: Oral  PainSc:                  [name]

## 2016-06-28 NOTE — Progress Notes (Signed)
LCSW has reviewed chart. Recommendations meet for [facility] at DC.  Will sign off for now. Please re-consult if other needs arise.  Lane Hacker, MSW Clinical Social Work: Printmaker Coverage for :  Mel Almond  [phone]

## 2016-06-29 LAB — BASIC METABOLIC PANEL
ANION GAP: 5 (ref 5–15)
BUN: 19 mg/dL (ref 6–20)
CHLORIDE: 113 mmol/L — AB (ref 101–111)
CO2: 20 mmol/L — ABNORMAL LOW (ref 22–32)
Calcium: 8.1 mg/dL — ABNORMAL LOW (ref 8.9–10.3)
Creatinine, Ser: 1.27 mg/dL — ABNORMAL HIGH (ref 0.61–1.24)
GFR, EST NON AFRICAN AMERICAN: 56 mL/min — AB (ref >60)
Glucose, Bld: 148 mg/dL — ABNORMAL HIGH (ref 65–99)
POTASSIUM: 3.7 mmol/L (ref 3.5–5.1)
SODIUM: 138 mmol/L (ref 135–145)

## 2016-06-29 LAB — LIPID PANEL
CHOL/HDL RATIO: 2.2 ratio
CHOLESTEROL: 85 mg/dL (ref 0–200)
HDL: 38 mg/dL — ABNORMAL LOW (ref >40)
LDL Cholesterol: 36 mg/dL (ref 0–99)
TRIGLYCERIDES: 55 mg/dL (ref <150)
VLDL: 11 mg/dL (ref 0–40)

## 2016-06-29 LAB — GLUCOSE, CAPILLARY
Glucose-Capillary: 137 mg/dL — ABNORMAL HIGH (ref 65–99)
Glucose-Capillary: 139 mg/dL — ABNORMAL HIGH (ref 65–99)
Glucose-Capillary: 234 mg/dL — ABNORMAL HIGH (ref 65–99)

## 2016-06-29 LAB — CBC
HCT: 32.7 % — ABNORMAL LOW (ref 40.0–52.0)
HEMOGLOBIN: 10.9 g/dL — AB (ref 13.0–18.0)
MCH: 31.5 pg (ref 26.0–34.0)
MCHC: 33.2 g/dL (ref 32.0–36.0)
MCV: 94.8 fL (ref 80.0–100.0)
PLATELETS: 185 10*3/uL (ref 150–440)
RBC: 3.45 MIL/uL — AB (ref 4.40–5.90)
RDW: 14.4 % (ref 11.5–14.5)
WBC: 8.2 10*3/uL (ref 3.8–10.6)

## 2016-06-29 MED ORDER — TRAMADOL HCL 50 MG PO TABS: 50.0000 mg | ORAL_TABLET | ORAL | 1 refills | Status: DC | PRN

## 2016-06-29 MED ORDER — ENOXAPARIN SODIUM 40 MG/0.4ML ~~LOC~~ SOLN: 40.0000 mg | SUBCUTANEOUS | 0 refills | Status: DC

## 2016-06-29 MED ORDER — LACTULOSE 10 GM/15ML PO SOLN
10.0000 g | Freq: Two times a day (BID) | ORAL | Status: DC | PRN
  Administered 2016-06-29: 10 g via ORAL
  Filled 2016-06-29: qty 30

## 2016-06-29 MED ORDER — OXYCODONE HCL 5 MG PO TABS: 5.0000 mg | ORAL_TABLET | ORAL | 0 refills | Status: DC | PRN

## 2016-06-29 NOTE — Progress Notes (Signed)
Pt being discharged home today. PIV removed. Discharge instructions reviewed with pt, all questions answered. Prescriptions given to pt to have filled. He was educated on proper technique for lovenox injections, verbal understanding and return demonstration were provided. His follow up appointments have been made. He is leaving with all his belongings, will be transported via family member.

## 2016-06-29 NOTE — Discharge Summary (Signed)
Physician Discharge Summary  Patient ID: Patrick Turner MRN: [id number] DOB/AGE: 71-Jan-1947 71 y.o.  Admit date: 06/27/2016 Discharge date: 06/29/2016  Admission Diagnoses:  PRIMARY OSTEOARTHRITIS   Discharge Diagnoses: Patient Active Problem List   Diagnosis Date Noted  . 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 Acute diarrhea 05/30/2016  . Acute encephalopathy 05/30/2016  . Anemia 05/30/2016  . Acute on chronic renal insufficiency 05/30/2016  . Diarrhea 05/30/2016  . Unspecified transient cerebral ischemia 05/29/2012    Past Medical History:  Diagnosis Date  . Altered mental status   . Anxiety   . Arthritis   . CRD (chronic renal disease)   . Depression   . Diabetes mellitus without complication ([place])   . GERD (gastroesophageal reflux disease)   . Hypercholesteremia   . Hypertension   . Neuropathy ([place])   . Sleep apnea   . Sleep terror    per patient this year per patient      Transfusion: Autovac transfusions given the first 6 hours postoperatively   Consultants (if any): Treatment Team:  Demetrios Loll, MD  Discharged Condition: Improved  Hospital Course: Patrick Turner is an 70 y.o. male who was admitted 06/27/2016 with a diagnosis of degenerative arthrosis right knee and went to the operating room on 06/27/2016 and underwent the above named procedures.    Surgeries:Procedure(s): COMPUTER ASSISTED TOTAL KNEE ARTHROPLASTY on 06/27/2016  PROCEDURE:  Right total knee arthroplasty using computer-assisted navigation  SURGEON:  Marciano Sequin. M.D.  ASSISTANT:  Vance Peper, PA (present and scrubbed throughout the case, critical for assistance with exposure, retraction, instrumentation, and closure)  ANESTHESIA: spinal  ESTIMATED BLOOD LOSS: 50 mL  FLUIDS REPLACED: 1000 mL of crystalloid  TOURNIQUET TIME: 97 minutes  DRAINS: 2 medium drains to a reinfusion system  SOFT TISSUE RELEASES: Anterior cruciate ligament, posterior cruciate  ligament, deep medial collateral ligament, patellofemoral ligament  IMPLANTS UTILIZED: DePuy Attune size 7 posterior stabilized femoral component (cemented), size 6 rotating platform tibial component (cemented), 38 mm medialized dome patella (cemented), and a 5 mm stabilized rotating platform polyethylene insert.  INDICATIONS FOR SURGERY: Patrick Turner is a 70 y.o. year old male with a long history of progressive knee pain. X-rays demonstrated severe degenerative changes in tricompartmental fashion. The patient had not seen any significant improvement despite conservative nonsurgical intervention. After discussion of the risks and benefits of surgical intervention, the patient expressed understanding of the risks benefits and agree with plans for total knee arthroplasty.   The risks, benefits, and alternatives were discussed at length including but not limited to the risks of infection, bleeding, nerve injury, stiffness, blood clots, the need for revision surgery, cardiopulmonary complications, among others, and they were willing to proceed. Patient tolerated the surgery well. No complications .Patient was taken to PACU where she was stabilized and then transferred to the orthopedic floor.  Patient started on Lovenox 30mg  q 12 hrs. Foot pumps applied bilaterally at 80 mm hgb. Heels elevated off bed with rolled towels. No evidence of DVT. Calves non tender. Negative Homan. Physical therapy started on day #1 for gait training and transfer with OT starting on  day #1 for ADL and assisted devices. Patient has done well with therapy. Ambulated greater than 200 feet upon being discharged.as able to go up and down 4 steps independently and safely  Patient's IVand Foley were discontinued on day #1 with the Hemovac being discontinued on day #2 with dressing change.   He was given perioperative antibiotics:  Anti-infectives  Start     Dose/Rate Route Frequency Ordered Stop   06/27/16 1830   clindamycin (CLEOCIN) IVPB 600 mg     600 mg 100 mL/hr over 30 Minutes Intravenous Every 6 hours 06/27/16 1656 06/28/16 1300   06/27/16 0850  clindamycin (CLEOCIN) 900 MG/50ML IVPB    Comments:  KENNEDY, DENISE: cabinet override      06/27/16 0850 06/27/16 1228   06/27/16 0142  clindamycin (CLEOCIN) IVPB 900 mg     900 mg 100 mL/hr over 30 Minutes Intravenous On call to O.R. 06/27/16 0142 06/27/16 1238    .  He was fitted with AV 1 compression foot pump devices, instructed on heel pumps,early ambulation, and fitted with TED stockings bilaterally for DVT prophylaxis.  He benefited maximally from the hospital stay and there were no complications.    Recent vital signs:  Vitals:   06/28/16 1927 06/29/16 0353  BP: (!) 166/54 (!) 155/57  Pulse: 91 93  Resp: 18 19  Temp: 98.4 F (36.9 C) 97.7 F (36.5 C)    Recent laboratory studies:  Lab Results  Component Value Date   HGB 10.9 (L) 06/29/2016   HGB 10.4 (L) 06/28/2016   HGB 11.9 (L) 06/27/2016   Lab Results  Component Value Date   WBC 8.2 06/29/2016   PLT 185 06/29/2016   Lab Results  Component Value Date   INR 0.98 06/15/2016   Lab Results  Component Value Date   NA 138 06/29/2016   K 3.7 06/29/2016   CL 113 (H) 06/29/2016   CO2 20 (L) 06/29/2016   BUN 19 06/29/2016   CREATININE 1.27 (H) 06/29/2016   GLUCOSE 148 (H) 06/29/2016    Discharge Medications:     Medication List    STOP taking these medications   aspirin EC 325 MG tablet     TAKE these medications   atorvastatin 80 MG tablet Commonly known as:  LIPITOR Take 80 mg by mouth at bedtime.   clonazePAM 1 MG tablet Commonly known as:  KLONOPIN Take 0.5 mg by mouth at bedtime.   enoxaparin 40 MG/0.4ML injection Commonly known as:  LOVENOX Inject 0.4 mLs (40 mg total) into the skin daily.   gabapentin 800 MG tablet Commonly known as:  NEURONTIN Take 800 mg by mouth 2 (two) times daily.   insulin aspart 100 UNIT/ML injection Commonly known  as:  novoLOG Inject 5 Units into the skin 2 (two) times daily before a meal. Morning and dinner   LEVEMIR 100 UNIT/ML injection Generic drug:  insulin detemir Inject 22 Units into the skin at bedtime.   lisinopril-hydrochlorothiazide 20-12.5 MG tablet Commonly known as:  PRINZIDE,ZESTORETIC Take 1 tablet by mouth daily.   metFORMIN 500 MG tablet Commonly known as:  GLUCOPHAGE Take 1,000 mg by mouth 2 (two) times daily with a meal.   omeprazole 40 MG capsule Commonly known as:  PRILOSEC Take 40 mg by mouth every morning.   oxyCODONE 5 MG immediate release tablet Commonly known as:  Oxy IR/ROXICODONE Take 1-2 tablets (5-10 mg total) by mouth every 4 (four) hours as needed for severe pain or breakthrough pain.   PARoxetine 40 MG tablet Commonly known as:  PAXIL Take 40 mg by mouth at bedtime.   pioglitazone 15 MG tablet Commonly known as:  ACTOS Take 15 mg by mouth daily.   QUEtiapine 100 MG tablet Commonly known as:  SEROQUEL Take 100 mg by mouth at bedtime.   topiramate 100 MG tablet Commonly known as:  TOPAMAX  Take 100 mg by mouth 2 (two) times daily.   traMADol 50 MG tablet Commonly known as:  ULTRAM Take 1-2 tablets (50-100 mg total) by mouth every 4 (four) hours as needed for moderate pain.   vitamin B-12 1000 MCG tablet Commonly known as:  CYANOCOBALAMIN Take 1,000 mcg by mouth daily.       Diagnostic Studies: Ct Head Wo Contrast  Result Date: 05/30/2016 CLINICAL DATA:  Dizziness, fall.  No loss of consciousness. EXAM: CT HEAD WITHOUT CONTRAST TECHNIQUE: Contiguous axial images were obtained from the base of the skull through the vertex without intravenous contrast. COMPARISON:  CT scan of November 05, 2015. FINDINGS: Bony calvarium is unremarkable. Visualized paranasal sinuses appear normal. No mass effect or midline shift is noted. Ventricular size is within normal limits. There is no evidence of mass lesion, hemorrhage or acute infarction. IMPRESSION: Normal  head CT. Electronically Signed   By: Marijo Conception, M.D.   On: 05/30/2016 10:30   US Venous Img Lower Bilateral  Result Date: 05/30/2016 CLINICAL DATA:  Lower extremity swelling EXAM: BILATERAL LOWER EXTREMITY VENOUS DUPLEX ULTRASOUND TECHNIQUE: Doppler venous assessment of the bilateral lower extremity deep venous system was performed, including characterization of spectral flow, compressibility, and phasicity. COMPARISON:  None. FINDINGS: There is complete compressibility of the bilateral common femoral, femoral, and popliteal veins. Doppler analysis demonstrates respiratory phasicity and augmentation of flow with calf compression. No obvious superficial vein or calf vein thrombosis. IMPRESSION: No evidence of lower extremity DVT. Electronically Signed   By: Marybelle Killings M.D.   On: 05/30/2016 16:57   Dg Knee Complete 4 Views Right  Result Date: 05/30/2016 CLINICAL DATA:  Status post fall.  Right anterior knee pain. EXAM: RIGHT KNEE - COMPLETE 4+ VIEW COMPARISON:  None. FINDINGS: No acute fracture dislocation. No lytic or sclerotic osseous lesion. Tricompartmental osteoarthritis of the right knee most severe in the medial femorotibial compartment and patellofemoral compartment. No significant joint effusion. IMPRESSION: No acute osseous injury of the right knee. Tricompartmental osteoarthritis of the right knee. Electronically Signed   By: Kathreen Devoid   On: 05/30/2016 10:53   Dg Knee Right Port  Result Date: 06/27/2016 CLINICAL DATA:  RIGHT total knee replacement EXAM: PORTABLE RIGHT KNEE - 1-2 VIEW COMPARISON:  Portable exam 1553 hours compared to 05/30/2016 FINDINGS: Osseous demineralization. Components of the RIGHT knee prosthesis are identified. No acute fracture, dislocation, bone destruction, or periprosthetic lucency. Pin tracts noted at tibia and fibula from templates. Anterior surgical drains and skin clips. IMPRESSION: RIGHT knee prosthesis without acute complication. Electronically Signed    By: Lavonia Dana M.D.   On: 06/27/2016 16:07    Disposition: 01-Home or Self Care  Discharge Instructions    Diet - low sodium heart healthy    Complete by:  As directed    Increase activity slowly    Complete by:  As directed       Follow-up Information    Cherrell Maybee R., PA On 07/12/2016.   Specialty:  Physician Assistant Why:  at 2:15pm Contact information: [place] [address] [phone]        Dereck Leep, MD On 08/09/2016.   Specialty:  Orthopedic Surgery Why:  at 11:15am Contact information: [place] [address] [phone]            Signed: Watt Climes 06/29/2016, 6:56 AM

## 2016-06-29 NOTE — Progress Notes (Signed)
Physical Therapy Treatment Patient Details Name: HARSHA ALCIVAR MRN: [id number] DOB: [birth year] Today's Date: 06/29/2016    History of Present Illness Pt admitted for R TKR.     PT Comments    Pt. Supine in bed upon arrival, motivated to participate in activity. Pt. Demonstrates improved AROM 0-94 degrees R knee ext/flexion today. Although pt. appears to grimace in pain at times with mobility he reports 1/10 pain throughout session in R knee.  Pt. Able to perform bed mobility with minA, using LLE to assist with RLE movement/support and physical assist for trunk transition to sitting EOB. Pt. Able to perform sit<>stand transfers from various surfaces with use of RW CGA with initial vc's for safe technique demonstrating good carryover of instruction throughout session. Pt. Able to ambulate a total of approx. 376ft. With use of RW CGA during today's session demonstrating continued decreased step length/height and decreased weight acceptance/stance time through RLE, pt. Demonstrates step to gait pattern and reliance on RW for BUE support able to demonstrate improved step length and cadence with ambulation distance and straight ambulation path. Pt. Completed 2 steps ascent backwards, descent forward, with RW and step to pattern with min A +2 for safety. Pt. Also completed ascent/descent of 4 steps with R sided railing step to pattern min A with +2 for safety. Would benefit from review of stair technique in pm session and/or setup for pt. To stay on main level of home upon d/c. Would benefit from skilled PT to address above deficits and promote optimal return to PLOF Recommend HHPT for further skilled PT needs.   Follow Up Recommendations  Home health PT;Supervision for mobility/OOB     Equipment Recommendations  Rolling walker with 5" wheels (youth RW)    Recommendations for Other Services       Precautions / Restrictions Precautions Precautions: Fall Restrictions Weight Bearing Restrictions:  Yes RLE Weight Bearing: Weight bearing as tolerated    Mobility  Bed Mobility Overal bed mobility: Needs Assistance Bed Mobility: Supine to Sit     Supine to sit: Min assist     General bed mobility comments: Pt. able to assist RLE movement with use of LLE assist, able to assist with UE support from bedrails. Pt. requires minA for full trunk transition to sitting EOB.   Transfers Overall transfer level: Needs assistance Equipment used: Rolling walker (2 wheeled) Transfers: Sit to/from Stand Sit to Stand: Min guard         General transfer comment: verbal reminder to push from seating surface, demonstrates good carryover of instruction throughout session.   Ambulation/Gait Ambulation/Gait assistance: Min guard Ambulation Distance (Feet): 300 Feet Assistive device: Rolling walker (2 wheeled)       General Gait Details: Pt. demonstrates decreased weight acceptance/stance time and decreased step length/height through RLE. demonstrates step to gait pattern, and heavy reliance on RW for BUE support. able to improve step length and cadence as ambulation distance progresses and with straight path.    Stairs Stairs: Yes Stairs assistance: +2 safety/equipment Stair Management: One rail Right Number of Stairs: 7 General stair comments: Pt. able to perform 3 steps backwards with use of RW and step to pattern to mimick home entry, also practiced ascent/descent of 4 steps with R sided railing and step to pattern to mimick home flight of stairs, pt. demonstrates improved form and RLE stability when cued to "push down through and keep RLE straight" when steppin gwith LLE.   Wheelchair Mobility    Modified Rankin (Stroke Patients  Only)       Balance Overall balance assessment: Needs assistance Sitting-balance support: Feet supported Sitting balance-Leahy Scale: Good     Standing balance support: Bilateral upper extremity supported Standing balance-Leahy Scale: Good                       Cognition Arousal/Alertness: Awake/alert Behavior During Therapy: WFL for tasks assessed/performed Overall Cognitive Status: Within Functional Limits for tasks assessed                      Exercises Total Joint Exercises Goniometric ROM: 0-94 degrees R knee ext/flexion Other Exercises Other Exercises: supine RLE quad sets x10, R knee flexion stretching in sitting 3x30 seconds.    General Comments        Pertinent Vitals/Pain Pain Assessment: 0-10 Pain Score: 1  Pain Location: R knee Pain Intervention(s): Monitored during session;Premedicated before session;Ice applied    Home Living                      Prior Function            PT Goals (current goals can now be found in the care plan section) Acute Rehab PT Goals Patient Stated Goal: "to regain my strength again" PT Goal Formulation: With patient Time For Goal Achievement: 07/12/16 Potential to Achieve Goals: Good Additional Goals Additional Goal #1: Pt will be able to perform bed mobility/transfers with supervision and use of RW to improve functional independence Progress towards PT goals: Progressing toward goals    Frequency    BID      PT Plan Current plan remains appropriate    Co-evaluation             End of Session Equipment Utilized During Treatment: Gait belt Activity Tolerance: Patient tolerated treatment well Patient left: in chair;with call bell/phone within reach;with chair alarm set     Time: [id number] PT Time Calculation (min) (ACUTE ONLY): 49 min  Charges:                       G Codes:      Melanie Crazier, SPT  2016/07/29,11:36 AM

## 2016-06-29 NOTE — Progress Notes (Signed)
Physical Therapy Treatment Patient Details Name: Patrick Turner MRN: [id number] DOB: Feb 08, 1946 Today's Date: 06/29/2016    History of Present Illness Pt admitted for R TKR.     PT Comments    Pt. In chair upon arrival, motivated to participate in activity. Pt. Able to perform sit<>stand transfers from various surfaces with RW supervision demonstrating good carryover of safe technique throughout session. Pt. Able to ambulate total distance of approx. 371ft. CGA with RW demonstrating step to pattern initially but able to progress to step through pattern with increased ambulation distance and cadence. Pt. Demonstrates decreased weight acceptance/stance time through RLE and reliance on RW for BUE support. Reviewed ascent/descent of 4 steps with use of R sided railing, pt. Able to complete CGA +2 for safety demonstrating good recall of step to pattern technique of leading with LLE for ascent and RLE for descent. Pt. Able to complete bathroom use and associated hygiene with supervision A. Discussed recommendation for pt. To stay on main level when returning home if possible as well as recommendation to have assistance present when attempting flight of stairs, pt. Verbalizes agreement. Would benefit from skilled PT to address above deficits and promote optimal return to PLOF Continue to recommend HHPT for further PT needs.   Follow Up Recommendations  Home health PT;Supervision for mobility/OOB     Equipment Recommendations  Rolling walker with 5" wheels (youth RW)    Recommendations for Other Services       Precautions / Restrictions Precautions Precautions: Fall Precaution Booklet Issued: No Restrictions Weight Bearing Restrictions: Yes RLE Weight Bearing: Weight bearing as tolerated    Mobility  Bed Mobility Overal bed mobility: Modified Independent Bed Mobility: Sit to Supine       Sit to supine: Modified independent (Device/Increase time)   General bed mobility comments: Pt.  able to assist RLE movement with assist of the LLE.   Transfers Overall transfer level: Needs assistance Equipment used: Rolling walker (2 wheeled) Transfers: Sit to/from Stand Sit to Stand: Supervision         General transfer comment: Pt. demonstrates carryover of instruction for safe technique with multiple transfers from various surfaces.   Ambulation/Gait Ambulation/Gait assistance: Min guard Ambulation Distance (Feet): 300 Feet Assistive device: Rolling walker (2 wheeled)       General Gait Details: Pt. demonstrates step to pattern when initiating gait, able to progress to step through pattern with ambulation distance and cues to increase speed. Pt. demonstrates decreased weight acceptance/stance time through RLE and reliance on RW for BUE support.    Stairs Stairs: Yes Stairs assistance: +2 safety/equipment Stair Management: One rail Right Number of Stairs: 4 General stair comments: Pt. able to asend steps with LLE leading step to pattern and descend with RLE step to pattern uses R handrail for B UE support.   Wheelchair Mobility    Modified Rankin (Stroke Patients Only)       Balance Overall balance assessment: Needs assistance Sitting-balance support: Feet unsupported Sitting balance-Leahy Scale: Good     Standing balance support: Bilateral upper extremity supported Standing balance-Leahy Scale: Good Standing balance comment: Pt. able to demonstrate static standing without BUE support                     Cognition Arousal/Alertness: Awake/alert Behavior During Therapy: WFL for tasks assessed/performed Overall Cognitive Status: Within Functional Limits for tasks assessed  Exercises Other Exercises Other Exercises: Pt. used restroom in attempt to have BM during today's session; demonstrates ability to perform functional reaching in sitting for bathroom hygiene, static standing without UE support for donning briefs,  ability to negotiate obstacles and various surfaces with RW all with supervision A    General Comments        Pertinent Vitals/Pain Pain Assessment: 0-10 Pain Score: 5  Pain Location: R knee Pain Descriptors / Indicators: Sharp Pain Intervention(s): Monitored during session;Repositioned;Ice applied    Home Living                      Prior Function            PT Goals (current goals can now be found in the care plan section) Acute Rehab PT Goals Patient Stated Goal: "to regain my strength again" PT Goal Formulation: With patient Time For Goal Achievement: 07/12/16 Potential to Achieve Goals: Good Additional Goals Additional Goal #1: Pt will be able to perform bed mobility/transfers with supervision and use of RW to improve functional independence Progress towards PT goals: Progressing toward goals    Frequency    BID      PT Plan Current plan remains appropriate    Co-evaluation             End of Session Equipment Utilized During Treatment: Gait belt Activity Tolerance: Patient tolerated treatment well Patient left: in bed;with bed alarm set;with call bell/phone within reach     Time: [id number] PT Time Calculation (min) (ACUTE ONLY): 31 min  Charges:                       G Codes:       Melanie Crazier, SPT  06/29/16,3:20 PM

## 2016-06-29 NOTE — Progress Notes (Signed)
   Subjective: 2 Days Post-Op Procedure(s) (LRB): COMPUTER ASSISTED TOTAL KNEE ARTHROPLASTY (Right) Patient reports pain as mild.   Patient is well, and has had no acute complaints or problems Continue with physical therapy today.  Plan is to go Home after hospital stay. no nausea and no vomiting Patient denies any chest pains or shortness of breath. Patient sleeping very well. No complaints.   Objective: Vital signs in last 24 hours: Temp:  [97.6 F (36.4 C)-98.7 F (37.1 C)] 97.7 F (36.5 C) (10/18 0353) Pulse Rate:  [66-93] 93 (10/18 0353) Resp:  [17-19] 19 (10/18 0353) BP: (110-166)/(50-64) 155/57 (10/18 0353) SpO2:  [97 %-100 %] 97 % (10/18 0353) well approximated incision Heels are non tender and elevated off the bed using rolled towels Intake/Output from previous day: 10/17 0701 - 10/18 0700 In: 600 [P.O.:600] Out: 2200 [Urine:2200] Intake/Output this shift: Total I/O In: -  Out: 1900 [Urine:1900]   Recent Labs  06/27/16 1801 06/28/16 0411 06/29/16 0428  HGB 11.9* 10.4* 10.9*    Recent Labs  06/28/16 0411 06/29/16 0428  WBC 6.8 8.2  RBC 3.34* 3.45*  HCT 30.9* 32.7*  PLT 172 185    Recent Labs  06/28/16 0411 06/29/16 0428  NA 136 138  K 4.0 3.7  CL 111 113*  CO2 20* 20*  BUN 23* 19  CREATININE 1.10 1.27*  GLUCOSE 159* 148*  CALCIUM 7.9* 8.1*   No results for input(s): LABPT, INR in the last 72 hours.  EXAM General - Patient is Alert, Appropriate and Oriented. patient a little bit on the hard of hearing side. Did not have his urinates in this morning. It is a little slow with speaking which appears to be his normal pattern. Extremity - Neurologically intact Neurovascular intact Sensation intact distally Intact pulses distally Dorsiflexion/Plantar flexion intact No cellulitis present Compartment soft Dressing - scant drainage Motor Function - intact, moving foot and toes well on exam. Difficulty with straight leg raise.  Past Medical  History:  Diagnosis Date  . Altered mental status   . Anxiety   . Arthritis   . CRD (chronic renal disease)   . Depression   . Diabetes mellitus without complication ([place])   . GERD (gastroesophageal reflux disease)   . Hypercholesteremia   . Hypertension   . Neuropathy ([place])   . Sleep apnea   . Sleep terror    per patient this year per patient     Assessment/Plan: 2 Days Post-Op Procedure(s) (LRB): COMPUTER ASSISTED TOTAL KNEE ARTHROPLASTY (Right) Active Problems:   S/P total knee arthroplasty  Estimated body mass index is 33.61 kg/m as calculated from the following:   Height as of this encounter: 5\' 5"  (1.651 m).   Weight as of this encounter: 91.6 kg (202 lb). Up with therapy Discharge home with home health  Labs: Were reviewed DVT Prophylaxis - Lovenox, Foot Pumps and TED hose Weight-Bearing as tolerated to right leg Patient needs to have a bowel movement today. We will add lactulose. Hemovac was discontinued on today's visit. Patient needs to do the lap around the nurse's desk and have a bowel movement prior to discharge and patient. If not able to do so will discharge tomorrow.  Jillyn Ledger. [place] 06/29/2016, 6:47 AM

## 2016-06-29 NOTE — Discharge Instructions (Signed)

## 2016-06-29 NOTE — Care Management (Signed)
Patient discharging home today. Wife is aware and agrees. She states that patient knows what home health agency to pick. Youth walker requested for delivery today prior to discharge today by [place]. Lovenox $111.87- patient aware and agrees. I spoke with patient and he would like to use [facility] for home health services.No further RNCM needs.

## 2016-06-30 DIAGNOSIS — Z471 Aftercare following joint replacement surgery: Secondary | ICD-10-CM | POA: Diagnosis not present

## 2016-06-30 DIAGNOSIS — N189 Chronic kidney disease, unspecified: Secondary | ICD-10-CM | POA: Diagnosis not present

## 2016-06-30 DIAGNOSIS — F329 Major depressive disorder, single episode, unspecified: Secondary | ICD-10-CM | POA: Diagnosis not present

## 2016-06-30 DIAGNOSIS — I129 Hypertensive chronic kidney disease with stage 1 through stage 4 chronic kidney disease, or unspecified chronic kidney disease: Secondary | ICD-10-CM | POA: Diagnosis not present

## 2016-06-30 DIAGNOSIS — E1140 Type 2 diabetes mellitus with diabetic neuropathy, unspecified: Secondary | ICD-10-CM | POA: Diagnosis not present

## 2016-06-30 DIAGNOSIS — E1122 Type 2 diabetes mellitus with diabetic chronic kidney disease: Secondary | ICD-10-CM | POA: Diagnosis not present

## 2016-07-01 DIAGNOSIS — N189 Chronic kidney disease, unspecified: Secondary | ICD-10-CM | POA: Diagnosis not present

## 2016-07-01 DIAGNOSIS — Z471 Aftercare following joint replacement surgery: Secondary | ICD-10-CM | POA: Diagnosis not present

## 2016-07-01 DIAGNOSIS — E1122 Type 2 diabetes mellitus with diabetic chronic kidney disease: Secondary | ICD-10-CM | POA: Diagnosis not present

## 2016-07-01 DIAGNOSIS — F329 Major depressive disorder, single episode, unspecified: Secondary | ICD-10-CM | POA: Diagnosis not present

## 2016-07-01 DIAGNOSIS — E1140 Type 2 diabetes mellitus with diabetic neuropathy, unspecified: Secondary | ICD-10-CM | POA: Diagnosis not present

## 2016-07-01 DIAGNOSIS — I129 Hypertensive chronic kidney disease with stage 1 through stage 4 chronic kidney disease, or unspecified chronic kidney disease: Secondary | ICD-10-CM | POA: Diagnosis not present

## 2016-07-04 DIAGNOSIS — I129 Hypertensive chronic kidney disease with stage 1 through stage 4 chronic kidney disease, or unspecified chronic kidney disease: Secondary | ICD-10-CM | POA: Diagnosis not present

## 2016-07-04 DIAGNOSIS — E1122 Type 2 diabetes mellitus with diabetic chronic kidney disease: Secondary | ICD-10-CM | POA: Diagnosis not present

## 2016-07-04 DIAGNOSIS — E1140 Type 2 diabetes mellitus with diabetic neuropathy, unspecified: Secondary | ICD-10-CM | POA: Diagnosis not present

## 2016-07-04 DIAGNOSIS — F329 Major depressive disorder, single episode, unspecified: Secondary | ICD-10-CM | POA: Diagnosis not present

## 2016-07-04 DIAGNOSIS — Z471 Aftercare following joint replacement surgery: Secondary | ICD-10-CM | POA: Diagnosis not present

## 2016-07-04 DIAGNOSIS — N189 Chronic kidney disease, unspecified: Secondary | ICD-10-CM | POA: Diagnosis not present

## 2016-07-06 DIAGNOSIS — Z471 Aftercare following joint replacement surgery: Secondary | ICD-10-CM | POA: Diagnosis not present

## 2016-07-06 DIAGNOSIS — F329 Major depressive disorder, single episode, unspecified: Secondary | ICD-10-CM | POA: Diagnosis not present

## 2016-07-06 DIAGNOSIS — E1122 Type 2 diabetes mellitus with diabetic chronic kidney disease: Secondary | ICD-10-CM | POA: Diagnosis not present

## 2016-07-06 DIAGNOSIS — N189 Chronic kidney disease, unspecified: Secondary | ICD-10-CM | POA: Diagnosis not present

## 2016-07-06 DIAGNOSIS — I129 Hypertensive chronic kidney disease with stage 1 through stage 4 chronic kidney disease, or unspecified chronic kidney disease: Secondary | ICD-10-CM | POA: Diagnosis not present

## 2016-07-06 DIAGNOSIS — E1140 Type 2 diabetes mellitus with diabetic neuropathy, unspecified: Secondary | ICD-10-CM | POA: Diagnosis not present

## 2016-07-07 DIAGNOSIS — F329 Major depressive disorder, single episode, unspecified: Secondary | ICD-10-CM | POA: Diagnosis not present

## 2016-07-07 DIAGNOSIS — E1140 Type 2 diabetes mellitus with diabetic neuropathy, unspecified: Secondary | ICD-10-CM | POA: Diagnosis not present

## 2016-07-07 DIAGNOSIS — I129 Hypertensive chronic kidney disease with stage 1 through stage 4 chronic kidney disease, or unspecified chronic kidney disease: Secondary | ICD-10-CM | POA: Diagnosis not present

## 2016-07-07 DIAGNOSIS — Z471 Aftercare following joint replacement surgery: Secondary | ICD-10-CM | POA: Diagnosis not present

## 2016-07-07 DIAGNOSIS — E1122 Type 2 diabetes mellitus with diabetic chronic kidney disease: Secondary | ICD-10-CM | POA: Diagnosis not present

## 2016-07-07 DIAGNOSIS — N189 Chronic kidney disease, unspecified: Secondary | ICD-10-CM | POA: Diagnosis not present

## 2016-07-10 DIAGNOSIS — F329 Major depressive disorder, single episode, unspecified: Secondary | ICD-10-CM | POA: Diagnosis not present

## 2016-07-10 DIAGNOSIS — N189 Chronic kidney disease, unspecified: Secondary | ICD-10-CM | POA: Diagnosis not present

## 2016-07-10 DIAGNOSIS — Z471 Aftercare following joint replacement surgery: Secondary | ICD-10-CM | POA: Diagnosis not present

## 2016-07-10 DIAGNOSIS — E1140 Type 2 diabetes mellitus with diabetic neuropathy, unspecified: Secondary | ICD-10-CM | POA: Diagnosis not present

## 2016-07-10 DIAGNOSIS — E1122 Type 2 diabetes mellitus with diabetic chronic kidney disease: Secondary | ICD-10-CM | POA: Diagnosis not present

## 2016-07-10 DIAGNOSIS — I129 Hypertensive chronic kidney disease with stage 1 through stage 4 chronic kidney disease, or unspecified chronic kidney disease: Secondary | ICD-10-CM | POA: Diagnosis not present

## 2016-07-11 DIAGNOSIS — I129 Hypertensive chronic kidney disease with stage 1 through stage 4 chronic kidney disease, or unspecified chronic kidney disease: Secondary | ICD-10-CM | POA: Diagnosis not present

## 2016-07-11 DIAGNOSIS — N189 Chronic kidney disease, unspecified: Secondary | ICD-10-CM | POA: Diagnosis not present

## 2016-07-11 DIAGNOSIS — E1122 Type 2 diabetes mellitus with diabetic chronic kidney disease: Secondary | ICD-10-CM | POA: Diagnosis not present

## 2016-07-11 DIAGNOSIS — F329 Major depressive disorder, single episode, unspecified: Secondary | ICD-10-CM | POA: Diagnosis not present

## 2016-07-11 DIAGNOSIS — E1140 Type 2 diabetes mellitus with diabetic neuropathy, unspecified: Secondary | ICD-10-CM | POA: Diagnosis not present

## 2016-07-11 DIAGNOSIS — Z471 Aftercare following joint replacement surgery: Secondary | ICD-10-CM | POA: Diagnosis not present

## 2016-07-12 ENCOUNTER — Emergency Department: Payer: Commercial Managed Care - HMO

## 2016-07-12 ENCOUNTER — Encounter: Payer: Self-pay | Admitting: Emergency Medicine

## 2016-07-12 ENCOUNTER — Observation Stay
Admission: EM | Admit: 2016-07-12 | Discharge: 2016-07-13 | Disposition: A | Discharge status: Home / Self Care | Payer: Commercial Managed Care - HMO | Attending: Internal Medicine | Admitting: Internal Medicine

## 2016-07-12 DIAGNOSIS — G4733 Obstructive sleep apnea (adult) (pediatric): Secondary | ICD-10-CM | POA: Insufficient documentation

## 2016-07-12 DIAGNOSIS — R4789 Other speech disturbances: Secondary | ICD-10-CM | POA: Diagnosis not present

## 2016-07-12 DIAGNOSIS — G478 Other sleep disorders: Secondary | ICD-10-CM | POA: Insufficient documentation

## 2016-07-12 DIAGNOSIS — K219 Gastro-esophageal reflux disease without esophagitis: Secondary | ICD-10-CM | POA: Diagnosis not present

## 2016-07-12 DIAGNOSIS — E7800 Pure hypercholesterolemia, unspecified: Secondary | ICD-10-CM | POA: Diagnosis not present

## 2016-07-12 DIAGNOSIS — F514 Sleep terrors [night terrors]: Secondary | ICD-10-CM | POA: Insufficient documentation

## 2016-07-12 DIAGNOSIS — Z96651 Presence of right artificial knee joint: Secondary | ICD-10-CM | POA: Diagnosis not present

## 2016-07-12 DIAGNOSIS — E1140 Type 2 diabetes mellitus with diabetic neuropathy, unspecified: Secondary | ICD-10-CM | POA: Insufficient documentation

## 2016-07-12 DIAGNOSIS — I351 Nonrheumatic aortic (valve) insufficiency: Secondary | ICD-10-CM | POA: Diagnosis not present

## 2016-07-12 DIAGNOSIS — I10 Essential (primary) hypertension: Secondary | ICD-10-CM | POA: Diagnosis not present

## 2016-07-12 DIAGNOSIS — I779 Disorder of arteries and arterioles, unspecified: Secondary | ICD-10-CM | POA: Diagnosis present

## 2016-07-12 DIAGNOSIS — Z794 Long term (current) use of insulin: Secondary | ICD-10-CM | POA: Insufficient documentation

## 2016-07-12 DIAGNOSIS — I129 Hypertensive chronic kidney disease with stage 1 through stage 4 chronic kidney disease, or unspecified chronic kidney disease: Secondary | ICD-10-CM | POA: Insufficient documentation

## 2016-07-12 DIAGNOSIS — Z888 Allergy status to other drugs, medicaments and biological substances status: Secondary | ICD-10-CM | POA: Diagnosis not present

## 2016-07-12 DIAGNOSIS — G459 Transient cerebral ischemic attack, unspecified: Principal | ICD-10-CM | POA: Insufficient documentation

## 2016-07-12 DIAGNOSIS — R262 Difficulty in walking, not elsewhere classified: Secondary | ICD-10-CM

## 2016-07-12 DIAGNOSIS — I700 Atherosclerosis of aorta: Secondary | ICD-10-CM | POA: Diagnosis not present

## 2016-07-12 DIAGNOSIS — E1122 Type 2 diabetes mellitus with diabetic chronic kidney disease: Secondary | ICD-10-CM | POA: Insufficient documentation

## 2016-07-12 DIAGNOSIS — E119 Type 2 diabetes mellitus without complications: Secondary | ICD-10-CM | POA: Diagnosis not present

## 2016-07-12 DIAGNOSIS — M1990 Unspecified osteoarthritis, unspecified site: Secondary | ICD-10-CM | POA: Insufficient documentation

## 2016-07-12 DIAGNOSIS — R4182 Altered mental status, unspecified: Secondary | ICD-10-CM

## 2016-07-12 DIAGNOSIS — F419 Anxiety disorder, unspecified: Secondary | ICD-10-CM | POA: Insufficient documentation

## 2016-07-12 DIAGNOSIS — Z880 Allergy status to penicillin: Secondary | ICD-10-CM | POA: Diagnosis not present

## 2016-07-12 DIAGNOSIS — R55 Syncope and collapse: Secondary | ICD-10-CM | POA: Diagnosis not present

## 2016-07-12 DIAGNOSIS — F329 Major depressive disorder, single episode, unspecified: Secondary | ICD-10-CM | POA: Insufficient documentation

## 2016-07-12 DIAGNOSIS — R531 Weakness: Secondary | ICD-10-CM

## 2016-07-12 DIAGNOSIS — M6281 Muscle weakness (generalized): Secondary | ICD-10-CM

## 2016-07-12 DIAGNOSIS — N189 Chronic kidney disease, unspecified: Secondary | ICD-10-CM | POA: Diagnosis not present

## 2016-07-12 LAB — BASIC METABOLIC PANEL
Anion gap: 12 (ref 5–15)
BUN: 20 mg/dL (ref 6–20)
CALCIUM: 9.5 mg/dL (ref 8.9–10.3)
CO2: 21 mmol/L — ABNORMAL LOW (ref 22–32)
CREATININE: 1.18 mg/dL (ref 0.61–1.24)
Chloride: 104 mmol/L (ref 101–111)
GFR calc non Af Amer: >60 mL/min (ref >60)
Glucose, Bld: 172 mg/dL — ABNORMAL HIGH (ref 65–99)
Potassium: 4.3 mmol/L (ref 3.5–5.1)
SODIUM: 137 mmol/L (ref 135–145)

## 2016-07-12 LAB — URINALYSIS COMPLETE WITH MICROSCOPIC (ARMC ONLY)
BILIRUBIN URINE: NEGATIVE
Bacteria, UA: NONE SEEN
Glucose, UA: NEGATIVE mg/dL
Leukocytes, UA: NEGATIVE
Nitrite: NEGATIVE
PROTEIN: NEGATIVE mg/dL
SPECIFIC GRAVITY, URINE: 1.034 — AB (ref 1.005–1.030)
pH: 7 (ref 5.0–8.0)

## 2016-07-12 LAB — BLOOD GAS, VENOUS
ACID-BASE DEFICIT: 1.2 mmol/L (ref 0.0–2.0)
BICARBONATE: 22.2 mmol/L (ref 20.0–28.0)
PCO2 VEN: 32 mmHg — AB (ref 44.0–60.0)
PH VEN: 7.45 — AB (ref 7.250–7.430)
Patient temperature: 37

## 2016-07-12 LAB — CBC
HCT: 36.9 % — ABNORMAL LOW (ref 40.0–52.0)
Hemoglobin: 12.6 g/dL — ABNORMAL LOW (ref 13.0–18.0)
MCH: 31.6 pg (ref 26.0–34.0)
MCHC: 34.2 g/dL (ref 32.0–36.0)
MCV: 92.5 fL (ref 80.0–100.0)
PLATELETS: 455 10*3/uL — AB (ref 150–440)
RBC: 3.99 MIL/uL — AB (ref 4.40–5.90)
RDW: 13.9 % (ref 11.5–14.5)
WBC: 10.6 10*3/uL (ref 3.8–10.6)

## 2016-07-12 LAB — GLUCOSE, CAPILLARY
GLUCOSE-CAPILLARY: 252 mg/dL — AB (ref 65–99)
Glucose-Capillary: 136 mg/dL — ABNORMAL HIGH (ref 65–99)

## 2016-07-12 LAB — TROPONIN I

## 2016-07-12 LAB — LACTIC ACID, PLASMA
Lactic Acid, Venous: 2.8 mmol/L (ref 0.5–1.9)
Lactic Acid, Venous: 1.1 mmol/L (ref 0.5–1.9)

## 2016-07-12 MED ORDER — PANTOPRAZOLE SODIUM 40 MG PO TBEC
40.0000 mg | DELAYED_RELEASE_TABLET | Freq: Every day | ORAL | Status: DC
  Administered 2016-07-12 – 2016-07-13 (×2): 40 mg via ORAL
  Filled 2016-07-12 (×2): qty 1

## 2016-07-12 MED ORDER — INSULIN ASPART 100 UNIT/ML ~~LOC~~ SOLN
0.0000 [IU] | Freq: Every day | SUBCUTANEOUS | Status: DC
  Administered 2016-07-12: 23:00:00 3 [IU] via SUBCUTANEOUS
  Filled 2016-07-12: qty 3

## 2016-07-12 MED ORDER — LORAZEPAM BOLUS VIA INFUSION: 0.5000 mg | Freq: Once | INTRAVENOUS | Status: DC

## 2016-07-12 MED ORDER — LEVOFLOXACIN IN D5W 750 MG/150ML IV SOLN
750.0000 mg | Freq: Once | INTRAVENOUS | Status: AC
  Administered 2016-07-12: 750 mg via INTRAVENOUS

## 2016-07-12 MED ORDER — PAROXETINE HCL 20 MG PO TABS
40.0000 mg | ORAL_TABLET | Freq: Every day | ORAL | Status: DC
  Administered 2016-07-12: 40 mg via ORAL
  Filled 2016-07-12: qty 2

## 2016-07-12 MED ORDER — INSULIN ASPART 100 UNIT/ML ~~LOC~~ SOLN
0.0000 [IU] | Freq: Three times a day (TID) | SUBCUTANEOUS | Status: DC
  Administered 2016-07-13: 5 [IU] via SUBCUTANEOUS
  Filled 2016-07-12: qty 5

## 2016-07-12 MED ORDER — TRAMADOL HCL 50 MG PO TABS
50.0000 mg | ORAL_TABLET | ORAL | Status: DC | PRN
  Administered 2016-07-12: 23:00:00 100 mg via ORAL
  Filled 2016-07-12: qty 2

## 2016-07-12 MED ORDER — VANCOMYCIN HCL IN DEXTROSE 1-5 GM/200ML-% IV SOLN
1000.0000 mg | Freq: Once | INTRAVENOUS | Status: AC
  Administered 2016-07-12: 1000 mg via INTRAVENOUS

## 2016-07-12 MED ORDER — LISINOPRIL-HYDROCHLOROTHIAZIDE 20-12.5 MG PO TABS: 1.0000 | ORAL_TABLET | Freq: Every day | ORAL | Status: DC

## 2016-07-12 MED ORDER — VANCOMYCIN HCL IN DEXTROSE 1-5 GM/200ML-% IV SOLN
INTRAVENOUS | Status: AC
  Administered 2016-07-12: 1000 mg via INTRAVENOUS
  Filled 2016-07-12: qty 200

## 2016-07-12 MED ORDER — STROKE: EARLY STAGES OF RECOVERY BOOK
Freq: Once | Status: AC
  Administered 2016-07-12: 21:00:00

## 2016-07-12 MED ORDER — GABAPENTIN 400 MG PO CAPS
800.0000 mg | ORAL_CAPSULE | Freq: Two times a day (BID) | ORAL | Status: DC
  Administered 2016-07-12 – 2016-07-13 (×2): 800 mg via ORAL
  Filled 2016-07-12 (×2): qty 2

## 2016-07-12 MED ORDER — DEXTROSE 5 % IV SOLN
2.0000 g | Freq: Three times a day (TID) | INTRAVENOUS | Status: DC
  Administered 2016-07-12 – 2016-07-13 (×3): 2 g via INTRAVENOUS
  Filled 2016-07-12 (×6): qty 2

## 2016-07-12 MED ORDER — LISINOPRIL 20 MG PO TABS
20.0000 mg | ORAL_TABLET | Freq: Every day | ORAL | Status: DC
  Administered 2016-07-12 – 2016-07-13 (×2): 20 mg via ORAL
  Filled 2016-07-12 (×2): qty 1

## 2016-07-12 MED ORDER — ASPIRIN 300 MG RE SUPP: 300.0000 mg | Freq: Every day | RECTAL | Status: DC

## 2016-07-12 MED ORDER — OXYCODONE HCL 5 MG PO TABS: 5.0000 mg | ORAL_TABLET | ORAL | Status: DC | PRN

## 2016-07-12 MED ORDER — LORAZEPAM 2 MG/ML IJ SOLN
0.5000 mg | Freq: Once | INTRAMUSCULAR | Status: AC
  Administered 2016-07-12: 0.5 mg via INTRAVENOUS
  Filled 2016-07-12: qty 1

## 2016-07-12 MED ORDER — ATORVASTATIN CALCIUM 20 MG PO TABS
80.0000 mg | ORAL_TABLET | Freq: Every day | ORAL | Status: DC
  Administered 2016-07-12: 80 mg via ORAL
  Filled 2016-07-12: qty 4

## 2016-07-12 MED ORDER — IOPAMIDOL (ISOVUE-370) INJECTION 76%
75.0000 mL | Freq: Once | INTRAVENOUS | Status: AC | PRN
  Administered 2016-07-12: 75 mL via INTRAVENOUS

## 2016-07-12 MED ORDER — ENOXAPARIN SODIUM 40 MG/0.4ML ~~LOC~~ SOLN
40.0000 mg | SUBCUTANEOUS | Status: DC
  Filled 2016-07-12: qty 0.4

## 2016-07-12 MED ORDER — SODIUM CHLORIDE 0.9 % IV SOLN
INTRAVENOUS | Status: DC
  Administered 2016-07-12: 21:00:00 via INTRAVENOUS

## 2016-07-12 MED ORDER — SENNOSIDES-DOCUSATE SODIUM 8.6-50 MG PO TABS: 1.0000 | ORAL_TABLET | Freq: Every evening | ORAL | Status: DC | PRN

## 2016-07-12 MED ORDER — INSULIN ASPART 100 UNIT/ML ~~LOC~~ SOLN
5.0000 [IU] | Freq: Two times a day (BID) | SUBCUTANEOUS | Status: DC
Start: 2016-07-13 — End: 2016-07-13

## 2016-07-12 MED ORDER — HYDRALAZINE HCL 20 MG/ML IJ SOLN: 10.0000 mg | Freq: Four times a day (QID) | INTRAMUSCULAR | Status: DC | PRN

## 2016-07-12 MED ORDER — QUETIAPINE FUMARATE 25 MG PO TABS
100.0000 mg | ORAL_TABLET | Freq: Every day | ORAL | Status: DC
  Administered 2016-07-12: 22:00:00 100 mg via ORAL
  Filled 2016-07-12: qty 4

## 2016-07-12 MED ORDER — CLONAZEPAM 0.5 MG PO TABS
0.5000 mg | ORAL_TABLET | Freq: Every day | ORAL | Status: DC
  Administered 2016-07-12: 22:00:00 0.5 mg via ORAL
  Filled 2016-07-12: qty 1

## 2016-07-12 MED ORDER — INSULIN DETEMIR 100 UNIT/ML ~~LOC~~ SOLN
22.0000 [IU] | Freq: Every day | SUBCUTANEOUS | Status: DC
  Administered 2016-07-12: 22 [IU] via SUBCUTANEOUS
  Filled 2016-07-12 (×2): qty 0.22

## 2016-07-12 MED ORDER — HYDROCHLOROTHIAZIDE 12.5 MG PO CAPS
12.5000 mg | ORAL_CAPSULE | Freq: Every day | ORAL | Status: DC
  Administered 2016-07-12 – 2016-07-13 (×2): 12.5 mg via ORAL
  Filled 2016-07-12 (×2): qty 1

## 2016-07-12 MED ORDER — TOPIRAMATE 25 MG PO TABS
100.0000 mg | ORAL_TABLET | Freq: Two times a day (BID) | ORAL | Status: DC
  Administered 2016-07-12 – 2016-07-13 (×2): 100 mg via ORAL
  Filled 2016-07-12 (×2): qty 4

## 2016-07-12 MED ORDER — ASPIRIN 325 MG PO TABS
325.0000 mg | ORAL_TABLET | Freq: Every day | ORAL | Status: DC
  Administered 2016-07-12 – 2016-07-13 (×2): 325 mg via ORAL
  Filled 2016-07-12 (×2): qty 1

## 2016-07-12 MED ORDER — GABAPENTIN 800 MG PO TABS
800.0000 mg | ORAL_TABLET | Freq: Two times a day (BID) | ORAL | Status: DC
  Filled 2016-07-12: qty 1

## 2016-07-12 MED ORDER — LEVOFLOXACIN IN D5W 750 MG/150ML IV SOLN
INTRAVENOUS | Status: AC
  Administered 2016-07-12: 750 mg via INTRAVENOUS
  Filled 2016-07-12: qty 150

## 2016-07-12 NOTE — ED Notes (Signed)
Pt provided with meal tray, per MD, post swallow screen.

## 2016-07-12 NOTE — ED Triage Notes (Signed)
Brought over from [facility]   Was getting ready to do PT s/p knee surgery  Became diaphoretic  And became very lethargic   FSBS  175

## 2016-07-12 NOTE — ED Notes (Signed)
Pt continues to tremble and shake, EDP made aware, warm blankets given, family at bedside

## 2016-07-12 NOTE — ED Notes (Signed)
At present pt awake and alert, resting in bed, family at bedside, pt answering questions appropriate, speech clear

## 2016-07-12 NOTE — ED Notes (Signed)
EDP at bedside  

## 2016-07-12 NOTE — H&P (Signed)
[facility] NAME: Patrick Turner    MR#:  [id number]  DATE OF BIRTH:  06-Jul-1946  DATE OF ADMISSION:  07/12/2016  PRIMARY CARE PHYSICIAN: Kirk Ruths., MD   REQUESTING/REFERRING PHYSICIAN: Nance Pear, MD  CHIEF COMPLAINT:   Chief Complaint  Patient presents with  . Near Syncope   Altered mental status and Speech changes today HISTORY OF PRESENT ILLNESS:  Patrick Turner  is a 70 y.o. male with a known history of Hypertension, diabetes, hyperlipidemia and sleep apnea. Patient was sent to ED from physical therapy due to sudden altered mental status, dizziness and speech changes. The patient recently got right knee replacement. He went to physical surgery today but was found confused and speech changes. He complains of dizziness shortness of breath and weakness. He denies any other symptoms. CT in July of chest didn't show any PE. ED physician suspects the patient has TIA and requested admission for TIA workup.  PAST MEDICAL HISTORY:   Past Medical History:  Diagnosis Date  . Altered mental status   . Anxiety   . Arthritis   . CRD (chronic renal disease)   . Depression   . Diabetes mellitus without complication ([place])   . GERD (gastroesophageal reflux disease)   . Hypercholesteremia   . Hypertension   . Neuropathy ([place])   . Sleep apnea   . Sleep terror    per patient this year per patient     PAST SURGICAL HISTORY:   Past Surgical History:  Procedure Laterality Date  . APPENDECTOMY    . CIRCUMCISION    . 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 KNEE ARTHROSCOPY Right   . TONSILLECTOMY      SOCIAL HISTORY:   Social History  Substance Use Topics  . Smoking status: Never Smoker  . Smokeless tobacco: Never Used  . Alcohol use Yes     Comment: rare     FAMILY HISTORY:   Family History  Problem Relation Age of Onset  . Diabetes Mother   . Heart disease Mother   . Cancer Father   . COPD Father     DRUG ALLERGIES:   Allergies  Allergen Reactions  . Levemir [Insulin Detemir] Other (See Comments)    Patient states that the generic gave him night terrors.  . Penicillins Anaphylaxis    Has patient had a PCN reaction causing immediate rash, facial/tongue/throat swelling, SOB or lightheadedness with hypotension: Yes Has patient had a PCN reaction causing severe rash involving mucus membranes or skin necrosis: No Has patient had a PCN reaction that required hospitalization Yes Has patient had a PCN reaction occurring within the last 10 years: No If all of the above answers are "NO", then may proceed with Cephalosporin use.  Simona Huh [Clemastine] Swelling    REVIEW OF SYSTEMS:   Review of Systems  Constitutional: Positive for malaise/fatigue. Negative for chills and fever.  Eyes: Negative for blurred vision and double vision.  Respiratory: Negative for cough, shortness of breath and stridor.   Cardiovascular: Negative for chest pain and leg swelling.  Skin: Negative for itching and rash.  Neurological: Positive for dizziness and speech change. Negative for focal weakness, seizures, loss of consciousness and headaches.  Psychiatric/Behavioral: Negative for depression. The patient is not nervous/anxious.     MEDICATIONS AT HOME:   Prior  to Admission medications   Medication Sig Start Date End Date Taking? Authorizing Provider  atorvastatin (LIPITOR) 80 MG tablet Take 80 mg by mouth at bedtime. 12/14/15  Yes Historical Provider, MD  clonazePAM (KLONOPIN) 1 MG tablet Take 0.5 mg by mouth at bedtime. 12/18/15  Yes Historical Provider, MD  enoxaparin (LOVENOX) 40 MG/0.4ML injection Inject 0.4 mLs (40 mg total) into the skin daily. 06/29/16 07/13/16 Yes Watt Climes, PA  gabapentin (NEURONTIN) 800 MG tablet Take 800 mg by mouth 2 (two)  times daily.    Yes Historical Provider, MD  insulin aspart (NOVOLOG) 100 UNIT/ML injection Inject 5 Units into the skin 2 (two) times daily before a meal. Morning and dinner   Yes Historical Provider, MD  LEVEMIR 100 UNIT/ML injection Inject 22 Units into the skin at bedtime.  01/03/16  Yes Historical Provider, MD  lisinopril-hydrochlorothiazide (PRINZIDE,ZESTORETIC) 20-12.5 MG per tablet Take 1 tablet by mouth daily.    Yes Historical Provider, MD  metFORMIN (GLUCOPHAGE) 500 MG tablet Take 1,000 mg by mouth 2 (two) times daily with a meal.   Yes Historical Provider, MD  omeprazole (PRILOSEC) 40 MG capsule Take 40 mg by mouth every morning.   Yes Historical Provider, MD  oxyCODONE (OXY IR/ROXICODONE) 5 MG immediate release tablet Take 1-2 tablets (5-10 mg total) by mouth every 4 (four) hours as needed for severe pain or breakthrough pain. 06/29/16  Yes Watt Climes, PA  PARoxetine (PAXIL) 40 MG tablet Take 40 mg by mouth at bedtime.   Yes Historical Provider, MD  pioglitazone (ACTOS) 15 MG tablet Take 15 mg by mouth daily. 11/24/15  Yes Historical Provider, MD  QUEtiapine (SEROQUEL) 100 MG tablet Take 100 mg by mouth at bedtime. 12/22/15  Yes Historical Provider, MD  topiramate (TOPAMAX) 100 MG tablet Take 100 mg by mouth 2 (two) times daily.   Yes Historical Provider, MD  traMADol (ULTRAM) 50 MG tablet Take 1-2 tablets (50-100 mg total) by mouth every 4 (four) hours as needed for moderate pain. 06/29/16  Yes Watt Climes, PA  vitamin B-12 (CYANOCOBALAMIN) 1000 MCG tablet Take 1,000 mcg by mouth daily.   Yes Historical Provider, MD      VITAL SIGNS:  Blood pressure (!) 175/79, pulse 80, temperature 97.7 F (36.5 C), temperature source Axillary, resp. rate (!) 27, height 5\' 5"  (1.651 m), weight 200 lb (90.7 kg), SpO2 100 %.  PHYSICAL EXAMINATION:  Physical Exam  GENERAL:  70 y.o.-year-old patient lying in the bed with no acute distress.  EYES: Pupils equal, round, reactive to light and  accommodation. No scleral icterus. Extraocular muscles intact.  HEENT: Head atraumatic, normocephalic. Oropharynx and nasopharynx clear.  NECK:  Supple, no jugular venous distention. No thyroid enlargement, no tenderness.  LUNGS: Normal breath sounds bilaterally, no wheezing, rales,rhonchi or crepitation. No use of accessory muscles of respiration.  CARDIOVASCULAR: S1, S2 normal. No murmurs, rubs, or gallops.  ABDOMEN: Soft, nontender, nondistended. Bowel sounds present. No organomegaly or mass.  EXTREMITIES: No pedal edema, cyanosis, or clubbing.  NEUROLOGIC: Cranial nerves II through XII are intact. Muscle strength 5/5 in all extremitiesExcept right leg. Sensation intact. Gait not checked.  PSYCHIATRIC: The patient is alert and oriented x 3.  SKIN: No obvious rash, lesion, or ulcer.   LABORATORY PANEL:   CBC  Recent Labs Lab 07/12/16 1532  WBC 10.6  HGB 12.6*  HCT 36.9*  PLT 455*   ------------------------------------------------------------------------------------------------------------------  Chemistries   Recent Labs Lab 07/12/16 1532  NA 137  K  4.3  CL 104  CO2 21*  GLUCOSE 172*  BUN 20  CREATININE 1.18  CALCIUM 9.5   ------------------------------------------------------------------------------------------------------------------  Cardiac Enzymes  Recent Labs Lab 07/12/16 1532  TROPONINI <0.03   ------------------------------------------------------------------------------------------------------------------  RADIOLOGY:  Ct Head Wo Contrast  Result Date: 07/12/2016 CLINICAL DATA:  Syncopal episode with altered mental status, dizziness, and shortness of breath. Recent knee arthroplasty. EXAM: CT HEAD WITHOUT CONTRAST TECHNIQUE: Contiguous axial images were obtained from the base of the skull through the vertex without intravenous contrast. COMPARISON:  05/30/2016 FINDINGS: Brain: There is no evidence of acute cortical infarct, intracranial hemorrhage,  mass, midline shift, or extra-axial fluid collection. The ventricles and sulci are normal. Vascular: Bilateral carotid siphon calcified atherosclerosis. No hyperdense vessel. Skull: No fracture or suspicious osseous lesion. Sinuses/Orbits: Visualized paranasal sinuses and mastoid air cells are clear. Orbits are unremarkable. Other: None. IMPRESSION: Unremarkable CT appearance of the brain for age. Electronically Signed   By: Logan Bores M.D.   On: 07/12/2016 16:15   Ct Angio Chest Pe W And/or Wo Contrast  Result Date: 07/12/2016 CLINICAL DATA:  Syncopal episode altered mental status. Tachypneic and pale EXAM: CT ANGIOGRAPHY CHEST WITH CONTRAST TECHNIQUE: Multidetector CT imaging of the chest was performed using the standard protocol during bolus administration of intravenous contrast. Multiplanar CT image reconstructions and MIPs were obtained to evaluate the vascular anatomy. CONTRAST:  75 mL Isovue 370 intravenous COMPARISON:  Chest x-ray 11/05/2015 FINDINGS: Cardiovascular: No evidence for filling defect within the central or segmental pulmonary arteries to suggest the presence of an acute embolus. Mild ectasia of the ascending aorta, measuring up to 3.7 cm in diameter. No dissection. Atherosclerotic calcifications of the aorta. No mediastinal hematoma. Mild coronary artery calcifications. Heart size within normal limits. No effusion. Mediastinum/Nodes: No enlarged mediastinal, hilar, or axillary lymph nodes. Thyroid gland, trachea, and esophagus demonstrate no significant findings. Lungs/Pleura: Lungs are clear. No pleural effusion or pneumothorax. Upper Abdomen: No acute abnormality. Musculoskeletal: No chest wall abnormality. No acute or significant osseous findings. Review of the MIP images confirms the above findings. IMPRESSION: 1. No CT evidence for acute pulmonary embolus or aortic dissection 2. Mild ectasia of the ascending aorta without discrete aneurysm. Electronically Signed   By: Donavan Foil  M.D.   On: 07/12/2016 16:21      IMPRESSION AND PLAN:   Speech changes, Possible TIA. The patient will be placed for observation. Start aspirin and continue Zocor. MRI of the brain, echocardiogram and carotid duplex. PT and speech evaluation.  Hypertension. Accelerated, start hydralazine IV when necessary. Continue hypertension medication. Diabetes. Continue Levemir and sliding scale. Sleep apnea. CPAP when necessary.  All the records are reviewed and case discussed with ED provider. Management plans discussed with the patient, his son and they are in agreement.  CODE STATUS: Full code  TOTAL TIME TAKING CARE OF THIS PATIENT: 52 minutes.    Demetrios Loll M.D on 07/12/2016 at 6:29 PM  Between 7am to 6pm - Pager - [phone]  After 6pm go to [email or web address] - Proofreader  [facility]  Office  [phone]  CC: Primary care physician; Kirk Ruths., MD   Note: This dictation was prepared with Dragon dictation along with smaller phrase technology. Any transcriptional errors that result from this process are unintentional.

## 2016-07-12 NOTE — ED Notes (Signed)
Admitting MD at bedside.

## 2016-07-12 NOTE — ED Notes (Addendum)
Pt placed 2L [place], pt arrives from [facility], pt had a syncopal episode at PT and since has experienced AMS, pt arrives tachpynic, pale, pt able to state name and location but not situation, pt shaking all over, right arm stiff and contracted to body

## 2016-07-12 NOTE — ED Provider Notes (Signed)
[facility] Emergency Department Provider Note   ____________________________________________   I have reviewed the triage vital signs and the nursing notes.   HISTORY  Chief Complaint Near Syncope   History limited by: Altered mental status, most history obtained from daughter in law   HPI Patrick Turner is a 70 y.o. male who presents to the emergency department today from physical therapy because of concern for sudden onset altered mental status, dizziness and what sounds like a syncopal or near syncopal episode. Daughter in law states she was with him most of the day. Picked him up this morning at which time he was feeling well. Recently underwent surgery on his right knee, but stated to the family that it was feeling good today. Went to physical therapy, when he was checking in all of a sudden felt dizzy and short of breath. Family states it was like he went to sleep. He has not had any chest pain. No recent fevers.   Past Medical History:  Diagnosis Date  . Altered mental status   . Anxiety   . Arthritis   . CRD (chronic renal disease)   . Depression   . Diabetes mellitus without complication ([place])   . GERD (gastroesophageal reflux disease)   . Hypercholesteremia   . Hypertension   . Neuropathy ([place])   . Sleep apnea   . Sleep terror    per patient this year per patient     Patient Active Problem List   Diagnosis Date Noted  . 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 Acute diarrhea 05/30/2016  . Acute encephalopathy 05/30/2016  . Anemia 05/30/2016  . Acute on chronic renal insufficiency 05/30/2016  . Diarrhea 05/30/2016  . Unspecified transient cerebral ischemia 05/29/2012    Past Surgical History:  Procedure Laterality Date  . APPENDECTOMY    . CIRCUMCISION    . KNEE ARTHROPLASTY Right 06/27/2016   Procedure: COMPUTER ASSISTED TOTAL KNEE ARTHROPLASTY;  Surgeon: Dereck Leep, MD;  Location: [facility] ORS;  Service: Orthopedics;   Laterality: Right;  . KNEE ARTHROSCOPY Right   . TONSILLECTOMY      Prior to Admission medications   Medication Sig Start Date End Date Taking? Authorizing Provider  atorvastatin (LIPITOR) 80 MG tablet Take 80 mg by mouth at bedtime. 12/14/15   Historical Provider, MD  clonazePAM (KLONOPIN) 1 MG tablet Take 0.5 mg by mouth at bedtime. 12/18/15   Historical Provider, MD  enoxaparin (LOVENOX) 40 MG/0.4ML injection Inject 0.4 mLs (40 mg total) into the skin daily. 06/29/16 07/13/16  Watt Climes, PA  gabapentin (NEURONTIN) 800 MG tablet Take 800 mg by mouth 2 (two) times daily.     Historical Provider, MD  insulin aspart (NOVOLOG) 100 UNIT/ML injection Inject 5 Units into the skin 2 (two) times daily before a meal. Morning and dinner    Historical Provider, MD  LEVEMIR 100 UNIT/ML injection Inject 22 Units into the skin at bedtime.  01/03/16   Historical Provider, MD  lisinopril-hydrochlorothiazide (PRINZIDE,ZESTORETIC) 20-12.5 MG per tablet Take 1 tablet by mouth daily.     Historical Provider, MD  metFORMIN (GLUCOPHAGE) 500 MG tablet Take 1,000 mg by mouth 2 (two) times daily with a meal.    Historical Provider, MD  omeprazole (PRILOSEC) 40 MG capsule Take 40 mg by mouth every morning.    Historical Provider, MD  oxyCODONE (OXY IR/ROXICODONE) 5 MG immediate release tablet Take 1-2 tablets (5-10 mg total) by mouth every 4 (four) hours as needed for severe  pain or breakthrough pain. 06/29/16   Watt Climes, PA  PARoxetine (PAXIL) 40 MG tablet Take 40 mg by mouth at bedtime.    Historical Provider, MD  pioglitazone (ACTOS) 15 MG tablet Take 15 mg by mouth daily. 11/24/15   Historical Provider, MD  QUEtiapine (SEROQUEL) 100 MG tablet Take 100 mg by mouth at bedtime. 12/22/15   Historical Provider, MD  topiramate (TOPAMAX) 100 MG tablet Take 100 mg by mouth 2 (two) times daily.    Historical Provider, MD  traMADol (ULTRAM) 50 MG tablet Take 1-2 tablets (50-100 mg total) by mouth every 4 (four) hours as needed  for moderate pain. 06/29/16   Watt Climes, PA  vitamin B-12 (CYANOCOBALAMIN) 1000 MCG tablet Take 1,000 mcg by mouth daily.    Historical Provider, MD    Allergies Levemir [insulin detemir]; Penicillins; and Tavist [clemastine]  No family history on file.  Social History Social History  Substance Use Topics  . Smoking status: Never Smoker  . Smokeless tobacco: Never Used  . Alcohol use Yes     Comment: rare    Review of Systems  Constitutional: Negative for fever. Cardiovascular: Negative for chest pain. Respiratory: Positive for shortness of breath. Gastrointestinal: Negative for abdominal pain, vomiting and diarrhea. Genitourinary: Negative for dysuria. Musculoskeletal: Recent right knee surgery. Skin: Negative for rash. Neurological: Negative for headaches, focal weakness or numbness.  10-point ROS otherwise negative.  ____________________________________________   PHYSICAL EXAM:  VITAL SIGNS: ED Triage Vitals  Enc Vitals Group     BP 07/12/16 1528 (!) 186/82     Pulse Rate 07/12/16 1528 95     Resp 07/12/16 1528 14     Temp --      Temp src --      SpO2 07/12/16 1528 100 %     Weight 07/12/16 1527 200 lb (90.7 kg)     Height 07/12/16 1527 5\' 5"  (1.651 m)   Constitutional: Awake and alert. Appears to be in distress. Eyes: Conjunctivae are normal. Normal extraocular movements. ENT   Head: Normocephalic and atraumatic.   Nose: No congestion/rhinnorhea.   Mouth/Throat: Mucous membranes are moist.   Neck: No stridor. Hematological/Lymphatic/Immunilogical: No cervical lymphadenopathy. Cardiovascular: Tachycardic, regular rhythm.  No murmurs, rubs, or gallops.  Respiratory: Tachypnea. Increased respiratory effort. No wheezing or rhonchi appreciated. Gastrointestinal: Soft and nontender. No distention.  Genitourinary: Deferred Musculoskeletal: Normal range of motion in all extremities. No lower extremity edema. Neurologic:  Normal speech and  language. No gross focal neurologic deficits are appreciated.  Skin:  Skin is warm, dry and intact. No rash noted.  ____________________________________________    LABS (pertinent positives/negatives)  Labs Reviewed  BASIC METABOLIC PANEL - Abnormal; Notable for the following:       Result Value   CO2 21 (*)    Glucose, Bld 172 (*)    All other components within normal limits  CBC - Abnormal; Notable for the following:    RBC 3.99 (*)    Hemoglobin 12.6 (*)    HCT 36.9 (*)    Platelets 455 (*)    All other components within normal limits  URINALYSIS COMPLETEWITH MICROSCOPIC ([facility] ONLY) - Abnormal; Notable for the following:    Color, Urine YELLOW (*)    APPearance CLEAR (*)    Ketones, ur TRACE (*)    Specific Gravity, Urine 1.034 (*)    Hgb urine dipstick 1+ (*)    Squamous Epithelial / LPF 0-5 (*)    All other components within  normal limits  GLUCOSE, CAPILLARY - Abnormal; Notable for the following:    Glucose-Capillary 136 (*)    All other components within normal limits  LACTIC ACID, PLASMA - Abnormal; Notable for the following:    Lactic Acid, Venous 2.8 (*)    All other components within normal limits  BLOOD GAS, VENOUS - Abnormal; Notable for the following:    pH, Ven 7.45 (*)    pCO2, Ven 32 (*)    All other components within normal limits  TROPONIN I  LACTIC ACID, PLASMA  CBG MONITORING, ED     ____________________________________________   EKG  I, Nance Pear, attending physician, personally viewed and interpreted this EKG  EKG Time: 1529 Rate: 110 Rhythm: sinus tachycardia Axis: normal Intervals: qtc 454 QRS: narrow ST changes: no st eelvation Impression: abnormal ekg   ____________________________________________    RADIOLOGY  CT head IMPRESSION:  Unremarkable CT appearance of the brain for age.     CT angio PE IMPRESSION:  1. No CT evidence for acute pulmonary embolus or aortic dissection  2. Mild ectasia of the ascending  aorta without discrete aneurysm.     ____________________________________________   PROCEDURES  Procedures  ____________________________________________   INITIAL IMPRESSION / ASSESSMENT AND PLAN / ED COURSE  Pertinent labs & imaging results that were available during my care of the patient were reviewed by me and considered in my medical decision making (see chart for details).  CT angio without signs of PE, head CT negative. Lactic was slightly elevated which raises concern for infection, however unclear source at this point. No PNA. No signs of UTI. Right knee without any erythema or warmth. At this point unclear etiology of the patient's symptoms. Will plan on admission to the hospitalist service.  ____________________________________________   FINAL CLINICAL IMPRESSION(S) / ED DIAGNOSES  Final diagnoses:  Altered mental status, unspecified altered mental status type     Note: This dictation was prepared with Dragon dictation. Any transcriptional errors that result from this process are unintentional    Nance Pear, MD 07/12/16 1821

## 2016-07-12 NOTE — ED Notes (Signed)
Dr. Archie Balboa notified of critical lactic

## 2016-07-12 NOTE — ED Triage Notes (Signed)
Pt arrived from [facility]. Pt recently had knee replacement. Pt went to PT and was baseline. Pt then seen by PA Eliberto Ivory and became diaphoretic and passed out. Pt responds to voice. Pt not responding verbally. Pt trembling.

## 2016-07-13 ENCOUNTER — Observation Stay: Payer: Commercial Managed Care - HMO

## 2016-07-13 ENCOUNTER — Observation Stay
Admit: 2016-07-13 | Discharge: 2016-07-13 | Disposition: A | Discharge status: Home / Self Care | Payer: Commercial Managed Care - HMO | Attending: Internal Medicine | Admitting: Internal Medicine

## 2016-07-13 DIAGNOSIS — I6523 Occlusion and stenosis of bilateral carotid arteries: Secondary | ICD-10-CM | POA: Diagnosis not present

## 2016-07-13 DIAGNOSIS — R69 Illness, unspecified: Secondary | ICD-10-CM | POA: Diagnosis not present

## 2016-07-13 DIAGNOSIS — R55 Syncope and collapse: Secondary | ICD-10-CM | POA: Diagnosis not present

## 2016-07-13 DIAGNOSIS — I10 Essential (primary) hypertension: Secondary | ICD-10-CM | POA: Diagnosis not present

## 2016-07-13 DIAGNOSIS — G459 Transient cerebral ischemic attack, unspecified: Secondary | ICD-10-CM | POA: Diagnosis not present

## 2016-07-13 DIAGNOSIS — E119 Type 2 diabetes mellitus without complications: Secondary | ICD-10-CM | POA: Diagnosis not present

## 2016-07-13 LAB — LIPID PANEL
CHOL/HDL RATIO: 2.6 ratio
CHOLESTEROL: 95 mg/dL (ref 0–200)
HDL: 37 mg/dL — AB (ref >40)
LDL Cholesterol: 45 mg/dL (ref 0–99)
Triglycerides: 66 mg/dL (ref <150)
VLDL: 13 mg/dL (ref 0–40)

## 2016-07-13 LAB — ECHOCARDIOGRAM COMPLETE
HEIGHTINCHES: 65 in
Weight: 3132.8 oz

## 2016-07-13 LAB — GLUCOSE, CAPILLARY
GLUCOSE-CAPILLARY: 114 mg/dL — AB (ref 65–99)
Glucose-Capillary: 163 mg/dL — ABNORMAL HIGH (ref 65–99)

## 2016-07-13 NOTE — Discharge Summary (Addendum)
[facility] NAME: Patrick Turner    MR#:  [id number]  DATE OF BIRTH:  03/23/1946  DATE OF ADMISSION:  07/12/2016 ADMITTING PHYSICIAN: Demetrios Loll, MD  DATE OF DISCHARGE: 07/13/2016  PRIMARY CARE PHYSICIAN: Kirk Ruths., MD    ADMISSION DIAGNOSIS:  Altered mental status, unspecified altered mental status type [R41.82]  DISCHARGE DIAGNOSIS:  Active Problems:   TIA (transient ischemic attack)   SECONDARY DIAGNOSIS:   Past Medical History:  Diagnosis Date  . Altered mental status   . Anxiety   . Arthritis   . CRD (chronic renal disease)   . Depression   . Diabetes mellitus without complication ([place])   . GERD (gastroesophageal reflux disease)   . Hypercholesteremia   . Hypertension   . Neuropathy ([place])   . Sleep apnea   . Sleep terror    per patient this year per patient     HOSPITAL COURSE:   70 y/o male with HTN and diabetes here with altered mental state.  1. Altered Mental State/syncope: This does not seem neurological in nature or cardiac. This may be vasovagal in nature. MRI/MRA negative. It may benefit him to see a neurologist in the future if this happens again.  2.HTN: Patient's BP better.  He will continue home medications  3. Diabetes: Patient should restart metformin tomorrow (48 after CT scan) and may continue Levemir and ADA diet as well as other outpatient diabetic medications.  4. Recent knee surgery: continue PRN pain meds and PT  5. Depression/Anxiety: on seroqeul and paxil  6. OSA Patient needs to be compliant with CPAP discussed with patient and family. DISCHARGE CONDITIONS AND DIET:   Stable Cardiac/diabetic diet  CONSULTS OBTAINED:  Treatment Team:  Dereck Leep, MD  DRUG ALLERGIES:   Allergies  Allergen Reactions  . Levemir [Insulin Detemir] Other (See Comments)    Patient states that the generic gave him night terrors.  . Penicillins Anaphylaxis    Has patient had a  PCN reaction causing immediate rash, facial/tongue/throat swelling, SOB or lightheadedness with hypotension: Yes Has patient had a PCN reaction causing severe rash involving mucus membranes or skin necrosis: No Has patient had a PCN reaction that required hospitalization Yes Has patient had a PCN reaction occurring within the last 10 years: No If all of the above answers are "NO", then may proceed with Cephalosporin use.  . Tavist [Clemastine] Swelling    DISCHARGE MEDICATIONS:   Current Discharge Medication List    CONTINUE these medications which have NOT CHANGED   Details  atorvastatin (LIPITOR) 80 MG tablet Take 80 mg by mouth at bedtime.    clonazePAM (KLONOPIN) 1 MG tablet Take 0.5 mg by mouth at bedtime. Refills: 2    enoxaparin (LOVENOX) 40 MG/0.4ML injection Inject 0.4 mLs (40 mg total) into the skin daily. Qty: 14 Syringe, Refills: 0    gabapentin (NEURONTIN) 800 MG tablet Take 800 mg by mouth 2 (two) times daily.     insulin aspart (NOVOLOG) 100 UNIT/ML injection Inject 5 Units into the skin 2 (two) times daily before a meal. Morning and dinner    LEVEMIR 100 UNIT/ML injection Inject 22 Units into the skin at bedtime.  Refills: 3    lisinopril-hydrochlorothiazide (PRINZIDE,ZESTORETIC) 20-12.5 MG per tablet Take 1 tablet by mouth daily.     metFORMIN (GLUCOPHAGE) 500 MG tablet Take 1,000 mg by mouth 2 (two) times daily with a meal.    omeprazole (PRILOSEC) 40 MG capsule  Take 40 mg by mouth every morning.    oxyCODONE (OXY IR/ROXICODONE) 5 MG immediate release tablet Take 1-2 tablets (5-10 mg total) by mouth every 4 (four) hours as needed for severe pain or breakthrough pain. Qty: 60 tablet, Refills: 0    PARoxetine (PAXIL) 40 MG tablet Take 40 mg by mouth at bedtime.    pioglitazone (ACTOS) 15 MG tablet Take 15 mg by mouth daily. Refills: 1    QUEtiapine (SEROQUEL) 100 MG tablet Take 100 mg by mouth at bedtime.    topiramate (TOPAMAX) 100 MG tablet Take 100 mg  by mouth 2 (two) times daily.    traMADol (ULTRAM) 50 MG tablet Take 1-2 tablets (50-100 mg total) by mouth every 4 (four) hours as needed for moderate pain. Qty: 60 tablet, Refills: 1    vitamin B-12 (CYANOCOBALAMIN) 1000 MCG tablet Take 1,000 mcg by mouth daily.              Today   CHIEF COMPLAINT:  Doing well this am   VITAL SIGNS:  Blood pressure (!) 119/50, pulse 70, temperature 97.5 F (36.4 C), temperature source Oral, resp. rate 20, height 5\' 5"  (1.651 m), weight 88.8 kg (195 lb 12.8 oz), SpO2 97 %.   REVIEW OF SYSTEMS:  Review of Systems  Constitutional: Negative.  Negative for chills, fever and malaise/fatigue.  HENT: Negative.  Negative for ear discharge, ear pain, hearing loss, nosebleeds and sore throat.   Eyes: Negative.  Negative for blurred vision and pain.  Respiratory: Negative.  Negative for cough, hemoptysis, shortness of breath and wheezing.   Cardiovascular: Negative.  Negative for chest pain, palpitations and leg swelling.  Gastrointestinal: Negative.  Negative for abdominal pain, blood in stool, diarrhea, nausea and vomiting.  Genitourinary: Negative.  Negative for dysuria.  Musculoskeletal: Negative.  Negative for back pain.  Skin: Negative.   Neurological: Negative for dizziness, tremors, speech change, focal weakness, seizures and headaches.  Endo/Heme/Allergies: Negative.  Does not bruise/bleed easily.  Psychiatric/Behavioral: Negative.  Negative for depression, hallucinations and suicidal ideas.     PHYSICAL EXAMINATION:  GENERAL:  70 y.o.-year-old patient lying in the bed with no acute distress.  NECK:  Supple, no jugular venous distention. No thyroid enlargement, no tenderness.  LUNGS: Normal breath sounds bilaterally, no wheezing, rales,rhonchi  No use of accessory muscles of respiration.  CARDIOVASCULAR: S1, S2 normal. No murmurs, rubs, or gallops.  ABDOMEN: Soft, non-tender, non-distended. Bowel sounds present. No organomegaly or  mass.  EXTREMITIES: No pedal edema, cyanosis, or clubbing.  PSYCHIATRIC: The patient is alert and oriented x 3.  SKIN: No obvious rash, lesion, or ulcer.   DATA REVIEW:   CBC  Recent Labs Lab 07/12/16 1532  WBC 10.6  HGB 12.6*  HCT 36.9*  PLT 455*    Chemistries   Recent Labs Lab 07/12/16 1532  NA 137  K 4.3  CL 104  CO2 21*  GLUCOSE 172*  BUN 20  CREATININE 1.18  CALCIUM 9.5    Cardiac Enzymes  Recent Labs Lab 07/12/16 1532  [place] <0.03    Microbiology Results  @[id number] @  RADIOLOGY:  Ct Head Wo Contrast  Result Date: 07/12/2016 CLINICAL DATA:  Syncopal episode with altered mental status, dizziness, and shortness of breath. Recent knee arthroplasty. EXAM: CT HEAD WITHOUT CONTRAST TECHNIQUE: Contiguous axial images were obtained from the base of the skull through the vertex without intravenous contrast. COMPARISON:  05/30/2016 FINDINGS: Brain: There is no evidence of acute cortical infarct, intracranial hemorrhage, mass, midline shift, or  extra-axial fluid collection. The ventricles and sulci are normal. Vascular: Bilateral carotid siphon calcified atherosclerosis. No hyperdense vessel. Skull: No fracture or suspicious osseous lesion. Sinuses/Orbits: Visualized paranasal sinuses and mastoid air cells are clear. Orbits are unremarkable. Other: None. IMPRESSION: Unremarkable CT appearance of the brain for age. Electronically Signed   By: Logan Bores M.D.   On: 07/12/2016 16:15   Ct Angio Chest Pe W And/or Wo Contrast  Result Date: 07/12/2016 CLINICAL DATA:  Syncopal episode altered mental status. Tachypneic and pale EXAM: CT ANGIOGRAPHY CHEST WITH CONTRAST TECHNIQUE: Multidetector CT imaging of the chest was performed using the standard protocol during bolus administration of intravenous contrast. Multiplanar CT image reconstructions and MIPs were obtained to evaluate the vascular anatomy. CONTRAST:  75 mL Isovue 370 intravenous COMPARISON:  Chest x-ray  11/05/2015 FINDINGS: Cardiovascular: No evidence for filling defect within the central or segmental pulmonary arteries to suggest the presence of an acute embolus. Mild ectasia of the ascending aorta, measuring up to 3.7 cm in diameter. No dissection. Atherosclerotic calcifications of the aorta. No mediastinal hematoma. Mild coronary artery calcifications. Heart size within normal limits. No effusion. Mediastinum/Nodes: No enlarged mediastinal, hilar, or axillary lymph nodes. Thyroid gland, trachea, and esophagus demonstrate no significant findings. Lungs/Pleura: Lungs are clear. No pleural effusion or pneumothorax. Upper Abdomen: No acute abnormality. Musculoskeletal: No chest wall abnormality. No acute or significant osseous findings. Review of the MIP images confirms the above findings. IMPRESSION: 1. No CT evidence for acute pulmonary embolus or aortic dissection 2. Mild ectasia of the ascending aorta without discrete aneurysm. Electronically Signed   By: Donavan Foil M.D.   On: 07/12/2016 16:21   Mr Brain Wo Contrast  Result Date: 07/13/2016 CLINICAL DATA:  TIA.  Dizziness.  Altered mental status. EXAM: MRI HEAD WITHOUT CONTRAST MRA HEAD WITHOUT CONTRAST TECHNIQUE: Multiplanar, multiecho pulse sequences of the brain and surrounding structures were obtained without intravenous contrast. Angiographic images of the head were obtained using MRA technique without contrast. COMPARISON:  CT head 07/12/2012 FINDINGS: MRI HEAD FINDINGS Brain: Ventricle size normal. Cerebral volume normal. Negative for acute infarct. Cerebral white matter normal. Brainstem and cerebellum normal. Mild chronic ischemia in the left thalamus. Negative for hemorrhage. Negative for mass or edema. No shift of the midline structures. Vascular: Normal arterial flow voids. Skull and upper cervical spine: Negative Sinuses/Orbits: Mild mucosal edema in the paranasal sinuses. Normal orbit. Other: None MRA HEAD FINDINGS Both vertebral arteries  appear normal. PICA patent bilaterally. Basilar widely patent. Superior cerebellar and posterior cerebral arteries appear normal Internal carotid artery normal bilaterally. Anterior and middle cerebral arteries normal bilaterally. No significant stenosis or occlusion. Negative for cerebral aneurysm. IMPRESSION: No acute intracranial abnormality. Mild chronic ischemia left thalamus Negative MRA head Electronically Signed   By: Franchot Gallo M.D.   On: 07/13/2016 10:24   Mr Jodene Nam Head/brain [id number] Cm  Result Date: 07/13/2016 CLINICAL DATA:  TIA.  Dizziness.  Altered mental status. EXAM: MRI HEAD WITHOUT CONTRAST MRA HEAD WITHOUT CONTRAST TECHNIQUE: Multiplanar, multiecho pulse sequences of the brain and surrounding structures were obtained without intravenous contrast. Angiographic images of the head were obtained using MRA technique without contrast. COMPARISON:  CT head 07/12/2012 FINDINGS: MRI HEAD FINDINGS Brain: Ventricle size normal. Cerebral volume normal. Negative for acute infarct. Cerebral white matter normal. Brainstem and cerebellum normal. Mild chronic ischemia in the left thalamus. Negative for hemorrhage. Negative for mass or edema. No shift of the midline structures. Vascular: Normal arterial flow voids. Skull and upper cervical spine:  Negative Sinuses/Orbits: Mild mucosal edema in the paranasal sinuses. Normal orbit. Other: None MRA HEAD FINDINGS Both vertebral arteries appear normal. PICA patent bilaterally. Basilar widely patent. Superior cerebellar and posterior cerebral arteries appear normal Internal carotid artery normal bilaterally. Anterior and middle cerebral arteries normal bilaterally. No significant stenosis or occlusion. Negative for cerebral aneurysm. IMPRESSION: No acute intracranial abnormality. Mild chronic ischemia left thalamus Negative MRA head Electronically Signed   By: Franchot Gallo M.D.   On: 07/13/2016 10:24      Management plans discussed with the patient and he is in  agreement. Stable for discharge home  Patient should follow up with pcp  CODE STATUS:     Code Status Orders        Start     Ordered   07/12/16 2023  Full code  Continuous     07/12/16 2022    Code Status History    Date Active Date Inactive Code Status Order ID Comments User Context   06/27/2016  4:56 PM 06/29/2016  8:34 PM Full Code [id number]  Dereck Leep, MD Inpatient   05/30/2016  4:39 PM 06/01/2016  3:01 PM Full Code [id number]  Theodoro Grist, MD Inpatient    Advance Directive Documentation   Flowsheet Row Most Recent Value  Type of Advance Directive  Living will  Pre-existing out of facility DNR order (yellow form or pink MOST form)  No data  "MOST" Form in Place?  No data      TOTAL TIME TAKING CARE OF THIS PATIENT: 35 minutes.    Note: This dictation was prepared with Dragon dictation along with smaller phrase technology. Any transcriptional errors that result from this process are unintentional.  Shayonna Ocampo M.D on 07/13/2016 at 11:36 AM  Between 7am to 6pm - Pager - [phone] After 6pm go to [email or web address] - password [email or web address]  Office  [phone]  CC: Primary care physician; Kirk Ruths., MD

## 2016-07-13 NOTE — Progress Notes (Signed)
SLP Cancellation Note  Patient Details Name: Patrick Turner MRN: [id number] DOB: [birth year]   Cancelled treatment:       Reason Eval/Treat Not Completed: SLP screened, no needs identified, will sign off. Chart reviewed. Pt was finishing a meal tray when ST services entered. Pt reported no concern for swallowing deficits nor speech-language deficits currently. Family member present in room and agreed with Pt's report. Pt and Family agree Pt is at his baseline with speech-language and swallowing abilities. ST services will be available for additional education or assessment if any decline in status during admission.    Rivka Safer, B.A. Clinical Graduate Student 07/13/2016, 12:07 PM

## 2016-07-13 NOTE — Progress Notes (Signed)
*  PRELIMINARY RESULTS* Echocardiogram 2D Echocardiogram has been performed.  Patrick Turner 07/13/2016, 8:56 AM

## 2016-07-13 NOTE — Care Management (Signed)
Admitted to [facility] with the diagnosis of TIA under observation status. Discharged from this facility 06/29/16. Lives with wife, [name] [phone]). Dr. Ouida Sills is listed as primary care physician. [facility] was arranged when discharged last admission. Mr. Pitzen said that [facility] has completed their services last Friday. He will be starting outpatient therapy. Prescriptions are filled at [facility]. Rolling Walker in the home. Shelbie Ammons RN MSN CCM Care Management [phone]

## 2016-07-13 NOTE — Evaluation (Signed)
Physical Therapy Evaluation Patient Details Name: Patrick Turner MRN: [id number] DOB: [birth year] Today's Date: 07/13/2016   History of Present Illness  Patrick Turner  is a 70 y.o. male with a known history of Hypertension, diabetes, hyperlipidemia and sleep apnea. Patient was sent to ED from physical therapy due to sudden altered mental status, dizziness and speech changes. The patient recently underwent right knee replacement at [facility] (~2 weeks ago). He went to physical therapy today but was found confused and with speech changes. He complains of dizziness, shortness of breath and weakness. He denies any other symptoms. CT in July of chest didn't show any PE. ED physician suspects the patient has TIA and requested admission for TIA workup.  CT and MRI negative.     Clinical Impression  Pt admitted with above diagnosis. Pt currently with functional limitations due to the deficits listed below (see PT Problem List). [name] reports he had just begun OPPT and was at his first OPPT appointment when when his symptoms began. He ambulated 600 ft with supervision using RW today and completed therapeutic exercises without any issues.   Pt will benefit from skilled PT to increase their independence and safety with mobility to allow discharge to the venue listed below.      Follow Up Recommendations Outpatient PT (pt had just initiated OPPT, recommend to continue this)    Equipment Recommendations  None recommended by PT    Recommendations for Other Services       Precautions / Restrictions Precautions Precautions: Fall Restrictions Weight Bearing Restrictions: Yes RLE Weight Bearing: Weight bearing as tolerated      Mobility  Bed Mobility Overal bed mobility: Modified Independent Bed Mobility: Supine to Sit     Supine to sit: Modified independent (Device/Increase time)     General bed mobility comments: Slightly increased time but no physical assist or cues needed.     Transfers Overall transfer level: Modified independent Equipment used: Rolling walker (2 wheeled) Transfers: Sit to/from Stand Sit to Stand: Modified independent (Device/Increase time)         General transfer comment: Pt demonstrates safe technique sit<>stand with RW.  Ambulation/Gait Ambulation/Gait assistance: Supervision Ambulation Distance (Feet): 600 Feet Assistive device: Rolling walker (2 wheeled) Gait Pattern/deviations: Step-through pattern;Decreased stride length;Decreased weight shift to right;Decreased stance time - right;Decreased step length - left   Gait velocity interpretation: at or above normal speed for age/gender General Gait Details: Pt demonstrates step through gait pattern and tolerated dynamic balance activities without instability.  Stairs            Wheelchair Mobility    Modified Rankin (Stroke Patients Only)       Balance Overall balance assessment: Needs assistance Sitting-balance support: No upper extremity supported;Feet supported Sitting balance-Leahy Scale: Good     Standing balance support: No upper extremity supported;During functional activity Standing balance-Leahy Scale: Good Standing balance comment: Pt able to stand at sink without UE support and brush his teeth with supervision for safety             High level balance activites: Direction changes;Turns;Sudden stops;Head turns;Other (comment) (increase change in gait speed) High Level Balance Comments: no instability noted with high level balance activities             Pertinent Vitals/Pain Pain Assessment: 0-10 Pain Score: 3  Pain Location: B feet h/o neuropathy Pain Descriptors / Indicators: Constant ("freezing") Pain Intervention(s): Limited activity within patient's tolerance;Monitored during session    Home Living Family/patient expects to  be discharged to:: Private residence Living Arrangements: Spouse/significant other (wife) Available Help at  Discharge: Family;Available PRN/intermittently (wife works 40 hrs/wk) Type of Home: House Home Access: Stairs to enter Entrance Stairs-Rails: None Technical brewer of Steps: 3 Home Layout: Two level;Bed/bath upstairs Home Equipment: Cane - single point;Adaptive equipment;Bedside commode;Walker - 2 wheels;Shower seat Additional Comments: cane was his father's    Prior Function Level of Independence: Independent with assistive device(s)         Comments: Not using RW in home but uses it in community     Hand Dominance   Dominant Hand: Right    Extremity/Trunk Assessment   Upper Extremity Assessment: Overall WFL for tasks assessed           Lower Extremity Assessment: RLE deficits/detail RLE Deficits / Details: ~2 weeks s/p R TKA. Staples have been removed. DF 5/5, otherwise strength grossly 4-/5    Cervical / Trunk Assessment: Normal  Communication   Communication: Expressive difficulties (hx of word finding problems and studdering speech pattern)  Cognition Arousal/Alertness: Awake/alert Behavior During Therapy: WFL for tasks assessed/performed Overall Cognitive Status: History of cognitive impairments - at baseline       Memory: Decreased short-term memory              General Comments General comments (skin integrity, edema, etc.): Pt reports he had one dizzy spell that lasted ~30 minutes with onset of symptoms yesterday but these since have resolved and have not returned.  He denies h/o dizziness.    Exercises Total Joint Exercises Ankle Circles/Pumps: AROM;Both;10 reps;Seated Straight Leg Raises: Strengthening;Right;10 reps;Supine Knee Flexion: Strengthening;Right;5 reps;Standing Standing Hip Extension: Strengthening;Both;10 reps;Standing General Exercises - Lower Extremity Hip Flexion/Marching: Strengthening;Both;10 reps;Standing Mini-Sqauts: Strengthening;10 reps;Standing;Other (comment) (with BUEs supported)   Assessment/Plan    PT  Assessment Patient needs continued PT services  PT Problem List Decreased strength;Decreased range of motion;Decreased balance;Pain          PT Treatment Interventions DME instruction;Gait training;Stair training;Functional mobility training;Therapeutic activities;Therapeutic exercise;Balance training;Neuromuscular re-education;Patient/family education;Cognitive remediation;Modalities;Manual techniques    PT Goals (Current goals can be found in the Care Plan section)  Acute Rehab PT Goals Patient Stated Goal: to go home PT Goal Formulation: With patient Time For Goal Achievement: 07/20/16 Potential to Achieve Goals: Good    Frequency Min 2X/week   Barriers to discharge        Co-evaluation               End of Session Equipment Utilized During Treatment: Gait belt Activity Tolerance: Patient tolerated treatment well Patient left: in bed;with call bell/phone within reach;with bed alarm set;with family/visitor present;Other (comment) (sitting EOB) Nurse Communication: Mobility status    Functional Assessment Tool Used: Clinical Judgement Functional Limitation: Mobility: Walking and moving around Mobility: Walking and Moving Around Current Status [id number]): At least 1 percent but less than 20 percent impaired, limited or restricted Mobility: Walking and Moving Around Goal Status [phone]): At least 1 percent but less than 20 percent impaired, limited or restricted    Time: 1311-1344 PT Time Calculation (min) (ACUTE ONLY): 33 min   Charges:   PT Evaluation $PT Eval Low Complexity: 1 Procedure PT Treatments $Therapeutic Exercise: 8-22 mins   PT G Codes:   PT G-Codes **NOT FOR INPATIENT CLASS** Functional Assessment Tool Used: Clinical Judgement Functional Limitation: Mobility: Walking and moving around Mobility: Walking and Moving Around Current Status [id number]): At least 1 percent but less than 20 percent impaired, limited or restricted Mobility: Walking and Moving Around  Goal Status [phone]): At least 1 percent but less than 20 percent impaired, limited or restricted    Collie Siad PT, DPT 07/13/2016, 2:31 PM

## 2016-07-13 NOTE — Plan of Care (Signed)
MD making rounds. Received order to discharge home. IV's removed. Patient provided with Education Handout. No Prescriptions. Discharge paperwork provided, explained, signed and witnessed. No unanswered questions. Discharged via wheelchair by nursing staff. Belongings sent with patient and family.

## 2016-07-13 NOTE — Evaluation (Signed)
Occupational Therapy Evaluation Patient Details Name: Patrick Turner MRN: [id number] DOB: 17-Oct-1945 Today's Date: 07/13/2016    History of Present Illness Patrick Turner  is a 70 y.o. male with a known history of Hypertension, diabetes, hyperlipidemia and sleep apnea. Patient was sent to ED from physical therapy due to sudden altered mental status, dizziness and speech changes. The patient recently underwent right knee replacement at [facility] (~2 weeks ago). He went to physical therapy today but was found confused and with speech changes. He complains of dizziness, shortness of breath and weakness. He denies any other symptoms. CT in July of chest didn't show any PE. ED physician suspects the patient has TIA and requested admission for TIA workup.  CT and MRI negative.    Clinical Impression   Pt is 70 year old male who presents with sudden altered mental status, dizziness and speech changes.  He was seen by therapist 2 weeks ago after his R TKR.  His mobility has improved with use of FWW and ambulated to toilet to urinate and needed cues to use FWW for his balance.  Supervision needed with min cues for safety.  He wear bilateral hearing aids and presents with decreased short term memory and word finding problems with studdering speech pattern intermittently which has been long standing problem for him (for about 15 years).  He did not present with any vision changes, sensation intact BUEs with mild decreased in finger to nose test but able to complete with mild under shooting.  Strength in BUEs 5/5. Pt would benefit from skilled OT services to address ADL training, balance training and family ed and training.  Pt would benefit from OT [facility] for continued rehab after discharge from hospital to ensure safe set up in bathroom and for ADLs.     Follow Up Recommendations  Home health OT    Equipment Recommendations       Recommendations for Other Services       Precautions / Restrictions  Precautions Precautions: Fall Restrictions Weight Bearing Restrictions: Yes RLE Weight Bearing: Weight bearing as tolerated      Mobility Bed Mobility                  Transfers                      Balance                                            ADL Overall ADL's : Needs assistance/impaired Eating/Feeding: Independent;Set up   Grooming: Wash/dry hands;Wash/dry face;Oral care;Applying deodorant;Brushing hair;Supervision/safety;Cueing for safety;Standing Grooming Details (indicate cue type and reason): supervision for balance and problem solving skills         Upper Body Dressing : Independent;Set up   Lower Body Dressing: Sit to/from stand;Set up;Minimal assistance Lower Body Dressing Details (indicate cue type and reason): sitting EOB; he does long sitting in bed to don socks and needed supervision and cues for safety with min assist Toilet Transfer: Min guard;Set up;Cueing for safety;Regular Toilet;RW Toilet Transfer Details (indicate cue type and reason): supervised for balance and cues needed to use FWW and not try to walk without it         Functional mobility during ADLs: Supervision/safety;Min guard;Set up;Cueing for safety;Rolling walker       Vision Vision Assessment?: Yes Eye Alignment: Within Functional  Limits Ocular Range of Motion: Within Functional Limits Alignment/Gaze Preference: Within Defined Limits Tracking/Visual Pursuits: Able to track stimulus in all quads without difficulty Saccades: Within functional limits Convergence: Within functional limits Visual Fields: No apparent deficits Depth Perception: Undershoots   Perception     Praxis      Pertinent Vitals/Pain Pain Assessment: No/denies pain     Hand Dominance Right   Extremity/Trunk Assessment Upper Extremity Assessment Upper Extremity Assessment: Overall WFL for tasks assessed   Lower Extremity Assessment Lower Extremity Assessment: Defer  to PT evaluation       Communication Communication Communication: Expressive difficulties (hx of word finding problems and studdering speech pattern)   Cognition Arousal/Alertness: Awake/alert Behavior During Therapy: WFL for tasks assessed/performed Overall Cognitive Status: History of cognitive impairments - at baseline       Memory: Decreased short-term memory             General Comments       Exercises       Shoulder Instructions      Home Living Family/patient expects to be discharged to:: Private residence Living Arrangements: Spouse/significant other Available Help at Discharge: Family Type of Home: House Home Access: Stairs to enter Technical brewer of Steps: 3 Entrance Stairs-Rails: None Home Layout: Two level;Bed/bath upstairs Alternate Level Stairs-Number of Steps: approximately 10-12 Alternate Level Stairs-Rails: Right Bathroom Shower/Tub: Tub/shower unit Shower/tub characteristics: Door Biochemist, clinical: Handicapped height Bathroom Accessibility: Yes How Accessible: Accessible via walker Home Equipment: Cane - single point;Adaptive equipment Adaptive Equipment: Reacher Additional Comments: cane was his father's      Prior Functioning/Environment Level of Independence: Independent with assistive device(s)        Comments: Pt has FWW for community distances but was not using it at home even though this was rec for balance.        OT Problem List: Decreased activity tolerance;Decreased safety awareness   OT Treatment/Interventions: Self-care/ADL training;Patient/family education;Balance training    OT Goals(Current goals can be found in the care plan section) Acute Rehab OT Goals Patient Stated Goal: "to have these dizzy spells go away, it was really bad last night" OT Goal Formulation: With patient/family Time For Goal Achievement: 07/27/16 Potential to Achieve Goals: Good ADL Goals Pt Will Perform Lower Body Dressing: with  supervision;sit to/from stand (using FWW and no LOB) Pt Will Transfer to Toilet: Independently;regular height toilet;stand pivot transfer (using FWW)  OT Frequency: Min 1X/week   Barriers to D/C:            Co-evaluation              End of Session Equipment Utilized During Treatment: Gait belt  Activity Tolerance: Patient tolerated treatment well Patient left: in bed;with call bell/phone within reach;with bed alarm set;with family/visitor present   Time: [id number] OT Time Calculation (min): 43 min Charges:  OT General Charges $OT Visit: 1 Procedure OT Evaluation $OT Eval Low Complexity: 1 Procedure OT Treatments $Self Care/Home Management : 23-37 mins G-Codes: OT G-codes **NOT FOR INPATIENT CLASS** Functional Limitation: Self care Self Care Current Status [id number]): At least 1 percent but less than 20 percent impaired, limited or restricted Self Care Goal Status [id number]): At least 1 percent but less than 20 percent impaired, limited or restricted   Chrys Racer, OTR/L ascom [phone] 07/13/16, 12:02 PM

## 2016-07-13 NOTE — Progress Notes (Signed)
OT Cancellation Note  Patient Details Name: Patrick Turner MRN: [id number] DOB: 04-19-1946   Cancelled Treatment:    Reason Eval/Treat Not Completed: Patient at procedure or test/ unavailable.  Order received and chart reviewed.  Spoke to patient's daughter in law about update.  Pt at MRI and ultrasound.  Will attempt again later today or tomorrow. Thank you for the referral.  Chrys Racer, OTR/L ascom [phone] 07/13/16, 9:13 AM

## 2016-07-14 LAB — HEMOGLOBIN A1C
HEMOGLOBIN A1C: 7 % — AB (ref 4.8–5.6)
Mean Plasma Glucose: 154 mg/dL

## 2016-07-15 DIAGNOSIS — Z96651 Presence of right artificial knee joint: Secondary | ICD-10-CM | POA: Diagnosis not present

## 2016-07-19 DIAGNOSIS — R55 Syncope and collapse: Secondary | ICD-10-CM | POA: Diagnosis not present

## 2016-07-20 DIAGNOSIS — Z96651 Presence of right artificial knee joint: Secondary | ICD-10-CM | POA: Diagnosis not present

## 2016-07-22 DIAGNOSIS — Z96651 Presence of right artificial knee joint: Secondary | ICD-10-CM | POA: Diagnosis not present

## 2016-07-25 DIAGNOSIS — Z96651 Presence of right artificial knee joint: Secondary | ICD-10-CM | POA: Diagnosis not present

## 2016-07-27 DIAGNOSIS — Z96651 Presence of right artificial knee joint: Secondary | ICD-10-CM | POA: Diagnosis not present

## 2016-07-29 DIAGNOSIS — Z96651 Presence of right artificial knee joint: Secondary | ICD-10-CM | POA: Diagnosis not present

## 2016-08-01 DIAGNOSIS — M6281 Muscle weakness (generalized): Secondary | ICD-10-CM | POA: Diagnosis not present

## 2016-08-01 DIAGNOSIS — G8929 Other chronic pain: Secondary | ICD-10-CM | POA: Diagnosis not present

## 2016-08-01 DIAGNOSIS — M25661 Stiffness of right knee, not elsewhere classified: Secondary | ICD-10-CM | POA: Diagnosis not present

## 2016-08-01 DIAGNOSIS — Z96651 Presence of right artificial knee joint: Secondary | ICD-10-CM | POA: Diagnosis not present

## 2016-08-01 DIAGNOSIS — M25561 Pain in right knee: Secondary | ICD-10-CM | POA: Diagnosis not present

## 2016-08-02 DIAGNOSIS — E1165 Type 2 diabetes mellitus with hyperglycemia: Secondary | ICD-10-CM | POA: Diagnosis not present

## 2016-08-02 DIAGNOSIS — Z794 Long term (current) use of insulin: Secondary | ICD-10-CM | POA: Diagnosis not present

## 2016-08-03 DIAGNOSIS — Z96651 Presence of right artificial knee joint: Secondary | ICD-10-CM | POA: Diagnosis not present

## 2016-08-08 DIAGNOSIS — Z96651 Presence of right artificial knee joint: Secondary | ICD-10-CM | POA: Diagnosis not present

## 2016-08-09 DIAGNOSIS — N183 Chronic kidney disease, stage 3 (moderate): Secondary | ICD-10-CM | POA: Diagnosis not present

## 2016-08-09 DIAGNOSIS — Z96651 Presence of right artificial knee joint: Secondary | ICD-10-CM | POA: Diagnosis not present

## 2016-08-09 DIAGNOSIS — Z794 Long term (current) use of insulin: Secondary | ICD-10-CM | POA: Diagnosis not present

## 2016-08-09 DIAGNOSIS — E1142 Type 2 diabetes mellitus with diabetic polyneuropathy: Secondary | ICD-10-CM | POA: Diagnosis not present

## 2016-08-09 DIAGNOSIS — E1122 Type 2 diabetes mellitus with diabetic chronic kidney disease: Secondary | ICD-10-CM | POA: Diagnosis not present

## 2016-08-14 DIAGNOSIS — Z96651 Presence of right artificial knee joint: Secondary | ICD-10-CM | POA: Insufficient documentation

## 2016-08-19 ENCOUNTER — Ambulatory Visit
Admission: RE | Admit: 2016-08-19 | Discharge: 2016-08-19 | Disposition: A | Discharge status: Home / Self Care | Payer: Commercial Managed Care - HMO | Source: Ambulatory Visit | Attending: Physician Assistant | Admitting: Physician Assistant

## 2016-08-19 ENCOUNTER — Other Ambulatory Visit: Payer: Self-pay | Admitting: Physician Assistant

## 2016-08-19 DIAGNOSIS — N183 Chronic kidney disease, stage 3 (moderate): Secondary | ICD-10-CM | POA: Diagnosis not present

## 2016-08-19 DIAGNOSIS — I10 Essential (primary) hypertension: Secondary | ICD-10-CM | POA: Diagnosis not present

## 2016-08-19 DIAGNOSIS — X58XXXA Exposure to other specified factors, initial encounter: Secondary | ICD-10-CM | POA: Insufficient documentation

## 2016-08-19 DIAGNOSIS — E1122 Type 2 diabetes mellitus with diabetic chronic kidney disease: Secondary | ICD-10-CM | POA: Diagnosis not present

## 2016-08-19 DIAGNOSIS — S0990XA Unspecified injury of head, initial encounter: Secondary | ICD-10-CM

## 2016-08-19 DIAGNOSIS — R42 Dizziness and giddiness: Secondary | ICD-10-CM | POA: Diagnosis not present

## 2016-08-19 DIAGNOSIS — G319 Degenerative disease of nervous system, unspecified: Secondary | ICD-10-CM | POA: Diagnosis not present

## 2016-08-27 DIAGNOSIS — Z471 Aftercare following joint replacement surgery: Secondary | ICD-10-CM | POA: Diagnosis not present

## 2016-08-29 DIAGNOSIS — N183 Chronic kidney disease, stage 3 (moderate): Secondary | ICD-10-CM | POA: Diagnosis not present

## 2016-08-29 DIAGNOSIS — G4733 Obstructive sleep apnea (adult) (pediatric): Secondary | ICD-10-CM | POA: Diagnosis not present

## 2016-08-29 DIAGNOSIS — E1169 Type 2 diabetes mellitus with other specified complication: Secondary | ICD-10-CM | POA: Diagnosis not present

## 2016-08-29 DIAGNOSIS — E785 Hyperlipidemia, unspecified: Secondary | ICD-10-CM | POA: Diagnosis not present

## 2016-08-29 DIAGNOSIS — I10 Essential (primary) hypertension: Secondary | ICD-10-CM | POA: Diagnosis not present

## 2016-08-29 DIAGNOSIS — Z0000 Encounter for general adult medical examination without abnormal findings: Secondary | ICD-10-CM | POA: Diagnosis not present

## 2016-08-29 DIAGNOSIS — F325 Major depressive disorder, single episode, in full remission: Secondary | ICD-10-CM | POA: Diagnosis not present

## 2016-08-29 DIAGNOSIS — E1122 Type 2 diabetes mellitus with diabetic chronic kidney disease: Secondary | ICD-10-CM | POA: Diagnosis not present

## 2016-08-30 DIAGNOSIS — Z471 Aftercare following joint replacement surgery: Secondary | ICD-10-CM | POA: Diagnosis not present

## 2016-09-14 DIAGNOSIS — F333 Major depressive disorder, recurrent, severe with psychotic symptoms: Secondary | ICD-10-CM | POA: Diagnosis not present

## 2016-09-27 DIAGNOSIS — R270 Ataxia, unspecified: Secondary | ICD-10-CM | POA: Diagnosis not present

## 2016-11-02 DIAGNOSIS — E785 Hyperlipidemia, unspecified: Secondary | ICD-10-CM | POA: Diagnosis not present

## 2016-11-02 DIAGNOSIS — E1142 Type 2 diabetes mellitus with diabetic polyneuropathy: Secondary | ICD-10-CM | POA: Diagnosis not present

## 2016-11-02 DIAGNOSIS — I10 Essential (primary) hypertension: Secondary | ICD-10-CM | POA: Diagnosis not present

## 2016-11-02 DIAGNOSIS — E1169 Type 2 diabetes mellitus with other specified complication: Secondary | ICD-10-CM | POA: Diagnosis not present

## 2016-11-09 DIAGNOSIS — Z794 Long term (current) use of insulin: Secondary | ICD-10-CM | POA: Diagnosis not present

## 2016-11-09 DIAGNOSIS — E1165 Type 2 diabetes mellitus with hyperglycemia: Secondary | ICD-10-CM | POA: Diagnosis not present

## 2016-11-09 DIAGNOSIS — E1142 Type 2 diabetes mellitus with diabetic polyneuropathy: Secondary | ICD-10-CM | POA: Insufficient documentation

## 2016-12-05 DIAGNOSIS — R0789 Other chest pain: Secondary | ICD-10-CM | POA: Diagnosis not present

## 2016-12-05 DIAGNOSIS — R079 Chest pain, unspecified: Secondary | ICD-10-CM | POA: Diagnosis not present

## 2016-12-14 DIAGNOSIS — F3341 Major depressive disorder, recurrent, in partial remission: Secondary | ICD-10-CM | POA: Diagnosis not present

## 2016-12-22 DIAGNOSIS — Z96651 Presence of right artificial knee joint: Secondary | ICD-10-CM | POA: Diagnosis not present

## 2016-12-26 DIAGNOSIS — M65352 Trigger finger, left little finger: Secondary | ICD-10-CM | POA: Diagnosis not present

## 2016-12-26 DIAGNOSIS — G4733 Obstructive sleep apnea (adult) (pediatric): Secondary | ICD-10-CM | POA: Diagnosis not present

## 2016-12-26 DIAGNOSIS — E1169 Type 2 diabetes mellitus with other specified complication: Secondary | ICD-10-CM | POA: Diagnosis not present

## 2016-12-26 DIAGNOSIS — I10 Essential (primary) hypertension: Secondary | ICD-10-CM | POA: Diagnosis not present

## 2016-12-26 DIAGNOSIS — Z0000 Encounter for general adult medical examination without abnormal findings: Secondary | ICD-10-CM | POA: Insufficient documentation

## 2016-12-26 DIAGNOSIS — N183 Chronic kidney disease, stage 3 (moderate): Secondary | ICD-10-CM | POA: Diagnosis not present

## 2016-12-26 DIAGNOSIS — G2119 Other drug induced secondary parkinsonism: Secondary | ICD-10-CM | POA: Diagnosis not present

## 2016-12-26 DIAGNOSIS — F325 Major depressive disorder, single episode, in full remission: Secondary | ICD-10-CM | POA: Diagnosis not present

## 2016-12-26 DIAGNOSIS — E1122 Type 2 diabetes mellitus with diabetic chronic kidney disease: Secondary | ICD-10-CM | POA: Diagnosis not present

## 2017-01-13 DIAGNOSIS — M65352 Trigger finger, left little finger: Secondary | ICD-10-CM | POA: Diagnosis not present

## 2017-02-01 DIAGNOSIS — E1165 Type 2 diabetes mellitus with hyperglycemia: Secondary | ICD-10-CM | POA: Diagnosis not present

## 2017-02-01 DIAGNOSIS — Z794 Long term (current) use of insulin: Secondary | ICD-10-CM | POA: Diagnosis not present

## 2017-02-08 DIAGNOSIS — E1142 Type 2 diabetes mellitus with diabetic polyneuropathy: Secondary | ICD-10-CM | POA: Diagnosis not present

## 2017-02-08 DIAGNOSIS — Z794 Long term (current) use of insulin: Secondary | ICD-10-CM | POA: Diagnosis not present

## 2017-02-08 DIAGNOSIS — E1165 Type 2 diabetes mellitus with hyperglycemia: Secondary | ICD-10-CM | POA: Diagnosis not present

## 2017-02-08 DIAGNOSIS — E1122 Type 2 diabetes mellitus with diabetic chronic kidney disease: Secondary | ICD-10-CM | POA: Diagnosis not present

## 2017-02-08 DIAGNOSIS — N183 Chronic kidney disease, stage 3 (moderate): Secondary | ICD-10-CM | POA: Diagnosis not present

## 2017-02-14 DIAGNOSIS — I10 Essential (primary) hypertension: Secondary | ICD-10-CM | POA: Diagnosis not present

## 2017-02-14 DIAGNOSIS — H524 Presbyopia: Secondary | ICD-10-CM | POA: Diagnosis not present

## 2017-02-14 DIAGNOSIS — E7800 Pure hypercholesterolemia, unspecified: Secondary | ICD-10-CM | POA: Diagnosis not present

## 2017-02-14 DIAGNOSIS — E119 Type 2 diabetes mellitus without complications: Secondary | ICD-10-CM | POA: Diagnosis not present

## 2017-02-14 DIAGNOSIS — H2510 Age-related nuclear cataract, unspecified eye: Secondary | ICD-10-CM | POA: Diagnosis not present

## 2017-03-17 DIAGNOSIS — F3341 Major depressive disorder, recurrent, in partial remission: Secondary | ICD-10-CM | POA: Diagnosis not present

## 2017-05-11 DIAGNOSIS — E1165 Type 2 diabetes mellitus with hyperglycemia: Secondary | ICD-10-CM | POA: Diagnosis not present

## 2017-05-11 DIAGNOSIS — Z794 Long term (current) use of insulin: Secondary | ICD-10-CM | POA: Diagnosis not present

## 2017-05-18 DIAGNOSIS — E1165 Type 2 diabetes mellitus with hyperglycemia: Secondary | ICD-10-CM | POA: Diagnosis not present

## 2017-05-18 DIAGNOSIS — E1159 Type 2 diabetes mellitus with other circulatory complications: Secondary | ICD-10-CM | POA: Diagnosis not present

## 2017-05-18 DIAGNOSIS — Z794 Long term (current) use of insulin: Secondary | ICD-10-CM | POA: Diagnosis not present

## 2017-05-18 DIAGNOSIS — E1142 Type 2 diabetes mellitus with diabetic polyneuropathy: Secondary | ICD-10-CM | POA: Diagnosis not present

## 2017-05-18 DIAGNOSIS — E1122 Type 2 diabetes mellitus with diabetic chronic kidney disease: Secondary | ICD-10-CM | POA: Diagnosis not present

## 2017-05-18 DIAGNOSIS — N183 Chronic kidney disease, stage 3 (moderate): Secondary | ICD-10-CM | POA: Diagnosis not present

## 2017-06-14 DIAGNOSIS — F333 Major depressive disorder, recurrent, severe with psychotic symptoms: Secondary | ICD-10-CM | POA: Diagnosis not present

## 2017-06-15 DIAGNOSIS — Z96651 Presence of right artificial knee joint: Secondary | ICD-10-CM | POA: Diagnosis not present

## 2017-06-25 ENCOUNTER — Observation Stay: Payer: Medicare HMO

## 2017-06-25 ENCOUNTER — Observation Stay
Admission: EM | Admit: 2017-06-25 | Discharge: 2017-06-26 | Disposition: A | Discharge status: Home / Self Care | Payer: Medicare HMO | Attending: Internal Medicine | Admitting: Internal Medicine

## 2017-06-25 ENCOUNTER — Emergency Department: Payer: Medicare HMO

## 2017-06-25 DIAGNOSIS — R4781 Slurred speech: Secondary | ICD-10-CM | POA: Diagnosis not present

## 2017-06-25 DIAGNOSIS — G459 Transient cerebral ischemic attack, unspecified: Principal | ICD-10-CM | POA: Insufficient documentation

## 2017-06-25 DIAGNOSIS — F329 Major depressive disorder, single episode, unspecified: Secondary | ICD-10-CM | POA: Insufficient documentation

## 2017-06-25 DIAGNOSIS — R404 Transient alteration of awareness: Secondary | ICD-10-CM | POA: Diagnosis not present

## 2017-06-25 DIAGNOSIS — Z79899 Other long term (current) drug therapy: Secondary | ICD-10-CM | POA: Diagnosis not present

## 2017-06-25 DIAGNOSIS — G4730 Sleep apnea, unspecified: Secondary | ICD-10-CM | POA: Insufficient documentation

## 2017-06-25 DIAGNOSIS — F419 Anxiety disorder, unspecified: Secondary | ICD-10-CM | POA: Diagnosis not present

## 2017-06-25 DIAGNOSIS — N189 Chronic kidney disease, unspecified: Secondary | ICD-10-CM | POA: Diagnosis not present

## 2017-06-25 DIAGNOSIS — Z7982 Long term (current) use of aspirin: Secondary | ICD-10-CM | POA: Diagnosis not present

## 2017-06-25 DIAGNOSIS — R2681 Unsteadiness on feet: Secondary | ICD-10-CM | POA: Insufficient documentation

## 2017-06-25 DIAGNOSIS — E785 Hyperlipidemia, unspecified: Secondary | ICD-10-CM | POA: Diagnosis not present

## 2017-06-25 DIAGNOSIS — E1140 Type 2 diabetes mellitus with diabetic neuropathy, unspecified: Secondary | ICD-10-CM | POA: Insufficient documentation

## 2017-06-25 DIAGNOSIS — I129 Hypertensive chronic kidney disease with stage 1 through stage 4 chronic kidney disease, or unspecified chronic kidney disease: Secondary | ICD-10-CM | POA: Diagnosis not present

## 2017-06-25 DIAGNOSIS — Z794 Long term (current) use of insulin: Secondary | ICD-10-CM | POA: Insufficient documentation

## 2017-06-25 DIAGNOSIS — Z880 Allergy status to penicillin: Secondary | ICD-10-CM | POA: Diagnosis not present

## 2017-06-25 DIAGNOSIS — I10 Essential (primary) hypertension: Secondary | ICD-10-CM | POA: Diagnosis not present

## 2017-06-25 DIAGNOSIS — E1122 Type 2 diabetes mellitus with diabetic chronic kidney disease: Secondary | ICD-10-CM | POA: Insufficient documentation

## 2017-06-25 DIAGNOSIS — E119 Type 2 diabetes mellitus without complications: Secondary | ICD-10-CM | POA: Diagnosis not present

## 2017-06-25 DIAGNOSIS — E7800 Pure hypercholesterolemia, unspecified: Secondary | ICD-10-CM | POA: Insufficient documentation

## 2017-06-25 DIAGNOSIS — R4182 Altered mental status, unspecified: Secondary | ICD-10-CM | POA: Diagnosis not present

## 2017-06-25 DIAGNOSIS — D649 Anemia, unspecified: Secondary | ICD-10-CM | POA: Diagnosis not present

## 2017-06-25 DIAGNOSIS — I779 Disorder of arteries and arterioles, unspecified: Secondary | ICD-10-CM | POA: Diagnosis present

## 2017-06-25 DIAGNOSIS — K219 Gastro-esophageal reflux disease without esophagitis: Secondary | ICD-10-CM | POA: Insufficient documentation

## 2017-06-25 DIAGNOSIS — I6523 Occlusion and stenosis of bilateral carotid arteries: Secondary | ICD-10-CM | POA: Diagnosis not present

## 2017-06-25 LAB — GLUCOSE, CAPILLARY
GLUCOSE-CAPILLARY: 176 mg/dL — AB (ref 65–99)
Glucose-Capillary: 238 mg/dL — ABNORMAL HIGH (ref 65–99)
Glucose-Capillary: 114 mg/dL — ABNORMAL HIGH (ref 65–99)
Glucose-Capillary: 145 mg/dL — ABNORMAL HIGH (ref 65–99)
Glucose-Capillary: 154 mg/dL — ABNORMAL HIGH (ref 65–99)

## 2017-06-25 LAB — COMPREHENSIVE METABOLIC PANEL
ALK PHOS: 99 U/L (ref 38–126)
ALT: 13 U/L — AB (ref 17–63)
AST: 27 U/L (ref 15–41)
Albumin: 4 g/dL (ref 3.5–5.0)
Anion gap: 13 (ref 5–15)
BUN: 27 mg/dL — AB (ref 6–20)
CALCIUM: 8.9 mg/dL (ref 8.9–10.3)
CHLORIDE: 104 mmol/L (ref 101–111)
CO2: 22 mmol/L (ref 22–32)
CREATININE: 1.47 mg/dL — AB (ref 0.61–1.24)
GFR calc Af Amer: 54 mL/min — ABNORMAL LOW (ref >60)
GFR calc non Af Amer: 46 mL/min — ABNORMAL LOW (ref >60)
GLUCOSE: 274 mg/dL — AB (ref 65–99)
Potassium: 4.2 mmol/L (ref 3.5–5.1)
SODIUM: 139 mmol/L (ref 135–145)
Total Bilirubin: 0.5 mg/dL (ref 0.3–1.2)
Total Protein: 7.3 g/dL (ref 6.5–8.1)

## 2017-06-25 LAB — APTT: APTT: 24 s (ref 24–36)

## 2017-06-25 LAB — CBC
HEMATOCRIT: 34.5 % — AB (ref 40.0–52.0)
Hemoglobin: 11.3 g/dL — ABNORMAL LOW (ref 13.0–18.0)
MCH: 28.6 pg (ref 26.0–34.0)
MCHC: 32.9 g/dL (ref 32.0–36.0)
MCV: 87 fL (ref 80.0–100.0)
PLATELETS: 258 10*3/uL (ref 150–440)
RBC: 3.96 MIL/uL — AB (ref 4.40–5.90)
RDW: 15.2 % — ABNORMAL HIGH (ref 11.5–14.5)
WBC: 6.2 10*3/uL (ref 3.8–10.6)

## 2017-06-25 LAB — LIPID PANEL
CHOLESTEROL: 120 mg/dL (ref 0–200)
HDL: 50 mg/dL (ref >40)
LDL CALC: 43 mg/dL (ref 0–99)
TRIGLYCERIDES: 136 mg/dL (ref <150)
Total CHOL/HDL Ratio: 2.4 RATIO
VLDL: 27 mg/dL (ref 0–40)

## 2017-06-25 LAB — TROPONIN I: Troponin I: <0.03 ng/mL (ref <0.03)

## 2017-06-25 LAB — DIFFERENTIAL
BASOS ABS: 0 10*3/uL (ref 0–0.1)
Basophils Relative: 1 %
Eosinophils Absolute: 0.2 10*3/uL (ref 0–0.7)
Eosinophils Relative: 3 %
LYMPHS PCT: 25 %
Lymphs Abs: 1.6 10*3/uL (ref 1.0–3.6)
Monocytes Absolute: 0.7 10*3/uL (ref 0.2–1.0)
Monocytes Relative: 11 %
NEUTROS ABS: 3.7 10*3/uL (ref 1.4–6.5)
Neutrophils Relative %: 60 %

## 2017-06-25 LAB — PROTIME-INR
INR: 0.97
PROTHROMBIN TIME: 12.8 s (ref 11.4–15.2)

## 2017-06-25 MED ORDER — INSULIN ASPART 100 UNIT/ML ~~LOC~~ SOLN
0.0000 [IU] | Freq: Three times a day (TID) | SUBCUTANEOUS | Status: DC
  Administered 2017-06-25: 2 [IU] via SUBCUTANEOUS
  Administered 2017-06-25 – 2017-06-26 (×2): 1 [IU] via SUBCUTANEOUS
  Filled 2017-06-25 (×3): qty 1

## 2017-06-25 MED ORDER — SODIUM CHLORIDE 0.9% FLUSH
3.0000 mL | Freq: Two times a day (BID) | INTRAVENOUS | Status: DC
  Administered 2017-06-25 (×2): 3 mL via INTRAVENOUS

## 2017-06-25 MED ORDER — QUETIAPINE FUMARATE 25 MG PO TABS
100.0000 mg | ORAL_TABLET | Freq: Every day | ORAL | Status: DC
  Administered 2017-06-25: 22:00:00 100 mg via ORAL
  Filled 2017-06-25: qty 4

## 2017-06-25 MED ORDER — ASPIRIN 300 MG RE SUPP: 300.0000 mg | Freq: Every day | RECTAL | Status: DC

## 2017-06-25 MED ORDER — INSULIN ASPART 100 UNIT/ML ~~LOC~~ SOLN: 0.0000 [IU] | Freq: Every day | SUBCUTANEOUS | Status: DC

## 2017-06-25 MED ORDER — CLONAZEPAM 0.5 MG PO TABS
0.5000 mg | ORAL_TABLET | Freq: Every day | ORAL | Status: DC
  Administered 2017-06-25: 0.5 mg via ORAL
  Filled 2017-06-25: qty 1

## 2017-06-25 MED ORDER — STROKE: EARLY STAGES OF RECOVERY BOOK
Freq: Once | Status: AC
  Administered 2017-06-25: 07:00:00

## 2017-06-25 MED ORDER — ACETAMINOPHEN 160 MG/5ML PO SOLN
650.0000 mg | ORAL | Status: DC | PRN
  Filled 2017-06-25: qty 20.3

## 2017-06-25 MED ORDER — HYDROCHLOROTHIAZIDE 12.5 MG PO CAPS
12.5000 mg | ORAL_CAPSULE | Freq: Every day | ORAL | Status: DC
  Administered 2017-06-25 – 2017-06-26 (×2): 12.5 mg via ORAL
  Filled 2017-06-25 (×2): qty 1

## 2017-06-25 MED ORDER — TOPIRAMATE 100 MG PO TABS
100.0000 mg | ORAL_TABLET | Freq: Two times a day (BID) | ORAL | Status: DC
  Administered 2017-06-25 – 2017-06-26 (×3): 100 mg via ORAL
  Filled 2017-06-25 (×3): qty 1

## 2017-06-25 MED ORDER — ACETAMINOPHEN 650 MG RE SUPP
650.0000 mg | RECTAL | Status: DC | PRN
Start: 2017-06-25 — End: 2017-06-26

## 2017-06-25 MED ORDER — VITAMIN B-12 1000 MCG PO TABS
1000.0000 ug | ORAL_TABLET | Freq: Every day | ORAL | Status: DC
  Administered 2017-06-25 – 2017-06-26 (×2): 1000 ug via ORAL
  Filled 2017-06-25 (×2): qty 1

## 2017-06-25 MED ORDER — ACETAMINOPHEN 325 MG PO TABS: 650.0000 mg | ORAL_TABLET | ORAL | Status: DC | PRN

## 2017-06-25 MED ORDER — SODIUM CHLORIDE 0.9% FLUSH: 3.0000 mL | INTRAVENOUS | Status: DC | PRN

## 2017-06-25 MED ORDER — ENOXAPARIN SODIUM 40 MG/0.4ML ~~LOC~~ SOLN
40.0000 mg | SUBCUTANEOUS | Status: DC
  Administered 2017-06-25 – 2017-06-26 (×2): 40 mg via SUBCUTANEOUS
  Filled 2017-06-25 (×2): qty 0.4

## 2017-06-25 MED ORDER — SODIUM CHLORIDE 0.9 % IV SOLN: 250.0000 mL | INTRAVENOUS | Status: DC | PRN

## 2017-06-25 MED ORDER — PAROXETINE HCL 20 MG PO TABS
40.0000 mg | ORAL_TABLET | Freq: Every day | ORAL | Status: DC
  Administered 2017-06-25: 22:00:00 40 mg via ORAL
  Filled 2017-06-25: qty 2

## 2017-06-25 MED ORDER — LISINOPRIL-HYDROCHLOROTHIAZIDE 20-12.5 MG PO TABS: 1.0000 | ORAL_TABLET | Freq: Every day | ORAL | Status: DC

## 2017-06-25 MED ORDER — PANTOPRAZOLE SODIUM 40 MG PO TBEC
40.0000 mg | DELAYED_RELEASE_TABLET | Freq: Every day | ORAL | Status: DC
  Administered 2017-06-25 – 2017-06-26 (×2): 40 mg via ORAL
  Filled 2017-06-25 (×2): qty 1

## 2017-06-25 MED ORDER — SENNOSIDES-DOCUSATE SODIUM 8.6-50 MG PO TABS
1.0000 | ORAL_TABLET | Freq: Every evening | ORAL | Status: DC | PRN
Start: 2017-06-25 — End: 2017-06-26

## 2017-06-25 MED ORDER — ASPIRIN 325 MG PO TABS: 325.0000 mg | ORAL_TABLET | Freq: Every day | ORAL | Status: DC

## 2017-06-25 MED ORDER — GABAPENTIN 400 MG PO CAPS
800.0000 mg | ORAL_CAPSULE | Freq: Two times a day (BID) | ORAL | Status: DC
  Administered 2017-06-25 – 2017-06-26 (×3): 800 mg via ORAL
  Filled 2017-06-25 (×3): qty 2

## 2017-06-25 MED ORDER — LISINOPRIL 20 MG PO TABS
20.0000 mg | ORAL_TABLET | Freq: Every day | ORAL | Status: DC
  Administered 2017-06-25 – 2017-06-26 (×2): 20 mg via ORAL
  Filled 2017-06-25 (×2): qty 1

## 2017-06-25 MED ORDER — ASPIRIN 325 MG PO TABS
325.0000 mg | ORAL_TABLET | Freq: Every day | ORAL | Status: DC
  Administered 2017-06-25 – 2017-06-26 (×2): 325 mg via ORAL
  Filled 2017-06-25 (×2): qty 1

## 2017-06-25 MED ORDER — ATORVASTATIN CALCIUM 20 MG PO TABS
80.0000 mg | ORAL_TABLET | Freq: Every day | ORAL | Status: DC
  Administered 2017-06-25: 22:00:00 80 mg via ORAL
  Filled 2017-06-25: qty 4

## 2017-06-25 NOTE — Consult Note (Signed)
Reason for Consult:slurry speech  Referring Physician: D.r Pyreddy   CC: Slurry speech   HPI: Patrick Turner is an 71 y.o. male  with a known history of anxiety disorder, GERD, hyperlipidemia, hypertension, sleep apnea, neuropathy, diabetes mellitus was working in the [employer].around 3:15 AM patient noticed slurred speech. He also was yelling at the customer and confused.patient was brought to the emergency room his initial CT head did not reveal any stroke.  States symptoms lasted up to 3 hrs and he is at baseline at this time.  No anti platelet therapy at home.     Past Medical History:  Diagnosis Date  . Altered mental status   . Anxiety   . Arthritis   . CRD (chronic renal disease)   . Depression   . Diabetes mellitus without complication ([place])   . GERD (gastroesophageal reflux disease)   . Hypercholesteremia   . Hypertension   . Neuropathy   . Sleep apnea   . Sleep terror    per patient this year per patient     Past Surgical History:  Procedure Laterality Date  . APPENDECTOMY    . CIRCUMCISION    . 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 KNEE ARTHROSCOPY Right   . TONSILLECTOMY      Family History  Problem Relation Age of Onset  . Diabetes Mother   . Heart disease Mother   . Cancer Father   . COPD Father     Social History:  reports that he has never smoked. He has never used smokeless tobacco. He reports that he drinks alcohol. He reports that he does not use drugs.  Allergies  Allergen Reactions  . Levemir [Insulin Detemir] Other (See Comments)    Patient states that the generic gave him night terrors.  . Penicillins Anaphylaxis    Has patient had a PCN reaction causing immediate rash, facial/tongue/throat swelling, SOB or lightheadedness with hypotension: Yes Has patient had a PCN reaction causing  severe rash involving mucus membranes or skin necrosis: No Has patient had a PCN reaction that required hospitalization Yes Has patient had a PCN reaction occurring within the last 10 years: No If all of the above answers are "NO", then may proceed with Cephalosporin use.  . Tavist [Clemastine] Swelling    Medications: I have reviewed the patient's current medications.  ROS: History obtained from the patient  General ROS: negative for - chills, fatigue, fever, night sweats, weight gain or weight loss Psychological ROS: history of anxiety Ophthalmic ROS: negative for - blurry vision, double vision, eye pain or loss of vision ENT ROS: negative for - epistaxis, nasal discharge, oral lesions, sore throat, tinnitus or vertigo Allergy and Immunology ROS: negative for - hives or itchy/watery eyes Hematological and Lymphatic ROS: negative for - bleeding problems, bruising or swollen lymph nodes Endocrine ROS: negative for - galactorrhea, hair pattern changes, polydipsia/polyuria or temperature intolerance Respiratory ROS: negative for - cough, hemoptysis, shortness of breath or wheezing Cardiovascular ROS: negative for - chest pain, dyspnea on exertion, edema or irregular heartbeat Gastrointestinal ROS: negative for - abdominal pain, diarrhea, hematemesis, nausea/vomiting or stool incontinence Genito-Urinary ROS: negative for - dysuria, hematuria, incontinence or urinary frequency/urgency Musculoskeletal ROS: negative for - joint swelling or muscular weakness Neurological ROS: as noted in HPI Dermatological ROS: negative for rash and skin lesion changes  Physical Examination:  Blood pressure (!) 149/51, pulse 60, temperature 97.7 F (36.5 C), temperature source Oral, resp. rate 16, height 5\' 5"  (1.651 m), weight 87 kg (191 lb 12.8 oz), SpO2 100 %.  Neurological Examination   Mental Status: Alert, oriented, thought content appropriate.  Speech fluent without evidence of aphasia.  Able to  follow 3 step commands without difficulty. Cranial Nerves: II: Discs flat bilaterally; Visual fields grossly normal, pupils equal, round, reactive to light and accommodation III,IV, VI: ptosis not present, extra-ocular motions intact bilaterally V,VII: smile symmetric, facial light touch sensation normal bilaterally VIII: hearing normal bilaterally IX,X: gag reflex present XI: bilateral shoulder shrug XII: midline tongue extension Motor: Right : Upper extremity   5/5    Left:     Upper extremity   5/5  Lower extremity   5/5     Lower extremity   5/5 Tone and bulk:normal tone throughout; no atrophy noted Sensory: Pinprick and light touch intact throughout, bilaterally Deep Tendon Reflexes: 1+ and symmetric throughout Plantars: Right: downgoing   Left: downgoing Cerebellar: normal finger-to-nose, normal rapid alternating movements and normal heel-to-shin test Gait: not tested       Laboratory Studies:   Basic Metabolic Panel:  Recent Labs Lab 06/25/17 0347  NA 139  K 4.2  CL 104  CO2 22  GLUCOSE 274*  BUN 27*  CREATININE 1.47*  CALCIUM 8.9    Liver Function Tests:  Recent Labs Lab 06/25/17 0347  AST 27  ALT 13*  ALKPHOS 99  BILITOT 0.5  PROT 7.3  ALBUMIN 4.0   No results for input(s): LIPASE, AMYLASE in the last 168 hours. No results for input(s): AMMONIA in the last 168 hours.  CBC:  Recent Labs Lab 06/25/17 0347  WBC 6.2  NEUTROABS 3.7  HGB 11.3*  HCT 34.5*  MCV 87.0  PLT 258    Cardiac Enzymes:  Recent Labs Lab 06/25/17 0347  TROPONINI <0.03    BNP: Invalid input(s): POCBNP  CBG:  Recent Labs Lab 06/25/17 0401 06/25/17 0839  GLUCAP 238* 114*    Microbiology: Results for orders placed or performed during the hospital encounter of 06/15/16  Urine culture     Status: Abnormal   Collection Time: 06/15/16  1:30 PM  Result Value Ref Range Status   Specimen Description URINE, RANDOM  Final   Special Requests Normal  Final    Culture (A)  Final    <10,000 COLONIES/mL INSIGNIFICANT GROWTH Performed at [facility]    Report Status 06/16/2016 FINAL  Final  Surgical pcr screen     Status: None   Collection Time: 06/15/16  1:30 PM  Result Value Ref Range Status   MRSA, PCR NEGATIVE NEGATIVE Final   Staphylococcus aureus NEGATIVE NEGATIVE Final    Comment:        The Xpert SA Assay (FDA approved for NASAL specimens in patients over 76 years of age), is one component of a comprehensive surveillance program.  Test performance has been validated by [facility] for patients greater than or equal to 47 year old. It is not intended to diagnose infection nor to guide or monitor treatment.     Coagulation Studies:  Recent Labs  06/25/17 0347  LABPROT 12.8  INR 0.97    Urinalysis: No results for input(s): COLORURINE, LABSPEC, PHURINE, GLUCOSEU, HGBUR, BILIRUBINUR, KETONESUR, PROTEINUR, UROBILINOGEN, NITRITE, LEUKOCYTESUR in the last 168 hours.  Invalid input(s): APPERANCEUR  Lipid Panel:     Component Value Date/Time   CHOL 120 06/25/2017 0347  TRIG 136 06/25/2017 0347   HDL 50 06/25/2017 0347   CHOLHDL 2.4 06/25/2017 0347   VLDL 27 06/25/2017 0347   LDLCALC 43 06/25/2017 0347    HgbA1C:  Lab Results  Component Value Date   HGBA1C 7.0 (H) 07/13/2016    Urine Drug Screen:     Component Value Date/Time   LABOPIA NEGATIVE 01/14/2012 1000   COCAINSCRNUR NEGATIVE 01/14/2012 1000   LABBENZ NEGATIVE 01/14/2012 1000   AMPHETMU NEGATIVE 01/14/2012 1000   THCU NEGATIVE 01/14/2012 1000   LABBARB NEGATIVE 01/14/2012 1000    Alcohol Level: No results for input(s): ETH in the last 168 hours.  Other results: EKG: normal EKG, normal sinus rhythm, unchanged from previous tracings.  Imaging: Ct Head Code Stroke Wo Contrast  Result Date: 06/25/2017 CLINICAL DATA:  Code stroke. Initial evaluation for acute altered mental status. EXAM: CT HEAD WITHOUT CONTRAST TECHNIQUE: Contiguous axial  images were obtained from the base of the skull through the vertex without intravenous contrast. COMPARISON:  Prior CT from 08/19/2016. FINDINGS: Brain: Age-related cerebral atrophy with mild chronic small vessel ischemic disease. 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 No acute intracranial hemorrhage. No evidence for acute large vessel territory infarct. No mass lesion, midline shift or mass effect. No hydrocephalus. No extra-axial fluid collection. Vascular: No hyperdense vessel. Scattered vascular calcifications noted within the carotid siphons. Skull: Scalp soft tissues and calvarium within normal limits. Sinuses/Orbits: Globes and orbital soft tissues within normal limits. Paranasal sinuses and mastoid air cells are clear. Other: None. ASPECTS [facility] Stroke Program Early CT Score) - Ganglionic level infarction (caudate, lentiform nuclei, internal capsule, insula, M1-M3 cortex): 7 - Supraganglionic infarction (M4-M6 cortex): 3 Total score (0-10 with 10 being normal): 10 IMPRESSION: 1. No acute intracranial infarct or other process identified. 2. ASPECTS is 10. 3. Mild age-related cerebral atrophy with chronic small vessel ischemic disease. Critical Value/emergent results were called by telephone at the time of interpretation on 06/25/2017 at 4:24 am to Dr. Charlesetta Ivory , who verbally acknowledged these results. Electronically Signed   By: Jeannine Boga M.D.   On: 06/25/2017 04:25     Assessment/Plan:  71 y.o. male  with a known history of anxiety disorder, GERD, hyperlipidemia, hypertension, sleep apnea, neuropathy, diabetes mellitus was working in the [employer].around 3:15 AM patient noticed slurred speech. He also was yelling at the customer and confused.patient was brought to the emergency room his initial CT head did not reveal any stroke.  States symptoms lasted up to 3 hrs and he is at baseline at this time.  No anti platelet therapy at home.    -  Awaiting MRI brain/US carotid - ASA daily/statin as was no on it - pt/ot -2decho - once above complete can likely be d/c today.  [name]  06/25/2017, 9:28 AM

## 2017-06-25 NOTE — ED Notes (Signed)
Per [employer] officer, patient had normal behavior until 0319. Patient then became agitated, yelled at customer and then simply stood at counter, without moving.

## 2017-06-25 NOTE — Care Management Obs Status (Signed)
[place] NOTIFICATION   Patient Details  Name: Patrick Turner MRN: [id number] Date of Birth: 1946/03/27   Medicare Observation Status Notification Given:  Yes    Donnavan Covault A, RN 06/25/2017, 3:09 PM

## 2017-06-25 NOTE — H&P (Signed)
[facility] NAME: Patrick Turner    MR#:  [id number]  DATE OF BIRTH:  06-03-1946  DATE OF ADMISSION:  06/25/2017  PRIMARY CARE PHYSICIAN: Kirk Ruths, MD   REQUESTING/REFERRING PHYSICIAN:   CHIEF COMPLAINT:   Chief Complaint  Patient presents with  . Altered Mental Status  . Code Stroke    HISTORY OF PRESENT ILLNESS: Patrick Turner  is a 71 y.o. male with a known history of anxiety disorder, GERD, hyperlipidemia, hypertension, sleep apnea, neuropathy, diabetes mellitus was working in the [employer].around 3:15 AM patient noticed slurred speech. He also was yelling at the customer and confused.patient was brought to the emergency room his initial CT head did not reveal any stroke. His speech improved in the emergency room. Was evaluated by tele neurology who recommended aspirin and workup for stroke. No thrombolytics were recommended. No complaints of any numbness, tingling in any part of the body. No weakness in any part of the body. Hospitalist service was consulted for further care.  PAST MEDICAL HISTORY:   Past Medical History:  Diagnosis Date  . Altered mental status   . Anxiety   . Arthritis   . CRD (chronic renal disease)   . Depression   . Diabetes mellitus without complication ([place])   . GERD (gastroesophageal reflux disease)   . Hypercholesteremia   . Hypertension   . Neuropathy   . Sleep apnea   . Sleep terror    per patient this year per patient     PAST SURGICAL HISTORY: Past Surgical History:  Procedure Laterality Date  . APPENDECTOMY    . CIRCUMCISION    . 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 KNEE ARTHROSCOPY Right   . TONSILLECTOMY      SOCIAL HISTORY:  Social History  Substance Use Topics  . Smoking status: Never Smoker  .  Smokeless tobacco: Never Used  . Alcohol use Yes     Comment: rare    FAMILY HISTORY:  Family History  Problem Relation Age of Onset  . Diabetes Mother   . Heart disease Mother   . Cancer Father   . COPD Father     DRUG ALLERGIES:  Allergies  Allergen Reactions  . Levemir [Insulin Detemir] Other (See Comments)    Patient states that the generic gave him night terrors.  . Penicillins Anaphylaxis    Has patient had a PCN reaction causing immediate rash, facial/tongue/throat swelling, SOB or lightheadedness with hypotension: Yes Has patient had a PCN reaction causing severe rash involving mucus membranes or skin necrosis: No Has patient had a PCN reaction that required hospitalization Yes Has patient had a PCN reaction occurring within the last 10 years: No If all of the above answers are "NO", then may proceed with Cephalosporin use.  . Tavist [Clemastine] Swelling    REVIEW OF SYSTEMS:   CONSTITUTIONAL: No fever, fatigue or weakness.  EYES: No blurred or double vision.  EARS, NOSE, AND THROAT: No tinnitus or ear pain.  RESPIRATORY: No cough, shortness of breath, wheezing or hemoptysis.  CARDIOVASCULAR: No chest pain, orthopnea, edema.  GASTROINTESTINAL: No nausea, vomiting, diarrhea or abdominal pain.  GENITOURINARY: No dysuria, hematuria.  ENDOCRINE: No polyuria, nocturia,  HEMATOLOGY: No anemia, easy bruising or bleeding SKIN: No rash or lesion. MUSCULOSKELETAL: No joint pain or arthritis.  NEUROLOGIC: No tingling, numbness, weakness.  Has slurred speech PSYCHIATRY: No anxiety or depression.   MEDICATIONS AT HOME:  Prior to Admission medications   Medication Sig Start Date End Date Taking? Authorizing Provider  atorvastatin (LIPITOR) 80 MG tablet Take 80 mg by mouth at bedtime. 12/14/15   [provider]  clonazePAM (KLONOPIN) 1 MG tablet Take 0.5 mg by mouth at bedtime. 12/18/15   [provider]  enoxaparin (LOVENOX) 40 MG/0.4ML injection Inject 0.4  mLs (40 mg total) into the skin daily. 06/29/16 07/13/16  Watt Climes, PA  gabapentin (NEURONTIN) 800 MG tablet Take 800 mg by mouth 2 (two) times daily.     [provider]  insulin aspart (NOVOLOG) 100 UNIT/ML injection Inject 5 Units into the skin 2 (two) times daily before a meal. Morning and dinner    [provider]  LEVEMIR 100 UNIT/ML injection Inject 22 Units into the skin at bedtime.  01/03/16   [provider]  lisinopril-hydrochlorothiazide (PRINZIDE,ZESTORETIC) 20-12.5 MG per tablet Take 1 tablet by mouth daily.     [provider]  metFORMIN (GLUCOPHAGE) 500 MG tablet Take 1,000 mg by mouth 2 (two) times daily with a meal.    [provider]  omeprazole (PRILOSEC) 40 MG capsule Take 40 mg by mouth every morning.    [provider]  oxyCODONE (OXY IR/ROXICODONE) 5 MG immediate release tablet Take 1-2 tablets (5-10 mg total) by mouth every 4 (four) hours as needed for severe pain or breakthrough pain. 06/29/16   Watt Climes, PA  PARoxetine (PAXIL) 40 MG tablet Take 40 mg by mouth at bedtime.    [provider]  pioglitazone (ACTOS) 15 MG tablet Take 15 mg by mouth daily. 11/24/15   [provider]  QUEtiapine (SEROQUEL) 100 MG tablet Take 100 mg by mouth at bedtime. 12/22/15   [provider]  topiramate (TOPAMAX) 100 MG tablet Take 100 mg by mouth 2 (two) times daily.    [provider]  traMADol (ULTRAM) 50 MG tablet Take 1-2 tablets (50-100 mg total) by mouth every 4 (four) hours as needed for moderate pain. 06/29/16   Watt Climes, PA  vitamin B-12 (CYANOCOBALAMIN) 1000 MCG tablet Take 1,000 mcg by mouth daily.    [provider]      PHYSICAL EXAMINATION:   VITAL SIGNS: Blood pressure (!) 151/64, pulse 72, temperature 97.6 F (36.4 C), temperature source Oral, resp. rate 19, SpO2 99 %.  GENERAL:  71 y.o.-year-old patient lying in the bed with no acute distress.  EYES: Pupils equal,  round, reactive to light and accommodation. No scleral icterus. Extraocular muscles intact.  HEENT: Head atraumatic, normocephalic. Oropharynx and nasopharynx clear.  NECK:  Supple, no jugular venous distention. No thyroid enlargement, no tenderness.  LUNGS: Normal breath sounds bilaterally, no wheezing, rales,rhonchi or crepitation. No use of accessory muscles of respiration.  CARDIOVASCULAR: S1, S2 normal. No murmurs, rubs, or gallops.  ABDOMEN: Soft, nontender, nondistended. Bowel sounds present. No organomegaly or mass.  EXTREMITIES: No pedal edema, cyanosis, or clubbing.  NEUROLOGIC: Cranial nerves II through XII are intact. Muscle strength 5/5 in all extremities. Sensation intact. Gait not checked.  PSYCHIATRIC: The patient is alert and oriented x 3.  SKIN: No obvious rash, lesion, or ulcer.   LABORATORY PANEL:   CBC  Recent Labs Lab 06/25/17 0347  WBC 6.2  HGB 11.3*  HCT 34.5*  PLT 258  MCV 87.0  MCH 28.6  MCHC 32.9  RDW 15.2*  LYMPHSABS 1.6  MONOABS 0.7  EOSABS 0.2  BASOSABS 0.0   ------------------------------------------------------------------------------------------------------------------  Chemistries   Recent Labs Lab 06/25/17 0347  NA 139  K 4.2  CL 104  CO2 22  GLUCOSE 274*  BUN 27*  CREATININE 1.47*  CALCIUM 8.9  AST 27  ALT 13*  ALKPHOS 99  BILITOT 0.5   ------------------------------------------------------------------------------------------------------------------ CrCl cannot be calculated (Unknown ideal weight.). ------------------------------------------------------------------------------------------------------------------ No results for input(s): TSH, T4TOTAL, T3FREE, THYROIDAB in the last 72 hours.  Invalid input(s): FREET3   Coagulation profile  Recent Labs Lab 06/25/17 0347  INR 0.97   ------------------------------------------------------------------------------------------------------------------- No results for  input(s): DDIMER in the last 72 hours. -------------------------------------------------------------------------------------------------------------------  Cardiac Enzymes  Recent Labs Lab 06/25/17 0347  TROPONINI <0.03   ------------------------------------------------------------------------------------------------------------------ Invalid input(s): POCBNP  ---------------------------------------------------------------------------------------------------------------  Urinalysis    Component Value Date/Time   COLORURINE YELLOW (A) 07/12/2016 1713   APPEARANCEUR CLEAR (A) 07/12/2016 1713   APPEARANCEUR Clear 11/23/2012 0629   LABSPEC 1.034 (H) 07/12/2016 1713   LABSPEC 1.018 11/23/2012 0629   PHURINE 7.0 07/12/2016 1713   GLUCOSEU NEGATIVE 07/12/2016 1713   GLUCOSEU Negative 11/23/2012 0629   HGBUR 1+ (A) 07/12/2016 1713   BILIRUBINUR NEGATIVE 07/12/2016 1713   BILIRUBINUR Negative 11/23/2012 0629   KETONESUR TRACE (A) 07/12/2016 1713   PROTEINUR NEGATIVE 07/12/2016 1713   NITRITE NEGATIVE 07/12/2016 1713   LEUKOCYTESUR NEGATIVE 07/12/2016 1713   LEUKOCYTESUR Negative 11/23/2012 0629     RADIOLOGY: Ct Head Code Stroke Wo Contrast  Result Date: 06/25/2017 CLINICAL DATA:  Code stroke. Initial evaluation for acute altered mental status. EXAM: CT HEAD WITHOUT CONTRAST TECHNIQUE: Contiguous axial images were obtained from the base of the skull through the vertex without intravenous contrast. COMPARISON:  Prior CT from 08/19/2016. FINDINGS: Brain: Age-related cerebral atrophy with mild chronic small vessel ischemic disease. 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 No acute intracranial hemorrhage. No evidence for acute large vessel territory infarct. No mass lesion, midline shift or mass effect. No hydrocephalus. No extra-axial fluid collection. Vascular: No hyperdense vessel. Scattered vascular calcifications noted within the carotid siphons. Skull:  Scalp soft tissues and calvarium within normal limits. Sinuses/Orbits: Globes and orbital soft tissues within normal limits. Paranasal sinuses and mastoid air cells are clear. Other: None. ASPECTS [facility] Stroke Program Early CT Score) - Ganglionic level infarction (caudate, lentiform nuclei, internal capsule, insula, M1-M3 cortex): 7 - Supraganglionic infarction (M4-M6 cortex): 3 Total score (0-10 with 10 being normal): 10 IMPRESSION: 1. No acute intracranial infarct or other process identified. 2. ASPECTS is 10. 3. Mild age-related cerebral atrophy with chronic small vessel ischemic disease. Critical Value/emergent results were called by telephone at the time of interpretation on 06/25/2017 at 4:24 am to Dr. Charlesetta Ivory , who verbally acknowledged these results. Electronically Signed   By: Jeannine Boga M.D.   On: 06/25/2017 04:25    EKG: Orders placed or performed during the hospital encounter of 06/25/17  . ED EKG  . ED EKG  . EKG 12-Lead  . EKG 12-Lead    IMPRESSION AND PLAN: 71 year old male patient with history of sleep apnea, anxiety disorder, depression, hypertension, hyperlipidemia,diabetes mellitus, neuropathy presented to the emergency room with slurred speech that started this morning and period of confusion.Slurred speech improved after arrival to the emergency room.  Admitting diagnosis 1. Transient ischemic attack 2. Hypertension 3. Hyperlipidemia 4. Diabetes mellitus Treatment plan Mid patient to observation bed Aspirin 320 family orally daily Check MRI brain and carotid ultrasound Check echocardiogram Neurology consultation  All the records are reviewed and case discussed with ED provider. Management plans discussed with the patient, family and they are in agreement.  CODE STATUS:FULL CODE Code Status History    Date Active Date Inactive Code Status Order ID Comments User Context   07/12/2016  8:22 PM 07/13/2016 11:11 PM Full Code [id number]  Demetrios Loll, MD  Inpatient   06/27/2016  4:56 PM 06/29/2016  8:34 PM Full Code [id number]  Dereck Leep, MD Inpatient   05/30/2016  4:39 PM 06/01/2016  3:01 PM Full Code [id number]  Theodoro Grist, MD Inpatient       TOTAL TIME TAKING CARE OF THIS PATIENT: 50 minutes.    Saundra Shelling M.D on 06/25/2017 at 5:52 AM  Between 7am to 6pm - Pager - [phone]  After 6pm go to [email or web address] - password [email or web address] [facility]  [facility]  Office  [phone]  CC: Primary care physician; Kirk Ruths, MD

## 2017-06-25 NOTE — ED Triage Notes (Signed)
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 Cash was on counter. Patient confused and disoriented.

## 2017-06-25 NOTE — Discharge Instructions (Signed)
Transient Ischemic Attack A transient ischemic attack (TIA) is a "warning stroke" that causes stroke-like symptoms. Unlike a stroke, a TIA does not cause permanent damage to the brain. The symptoms of a TIA can happen very fast and do not last long. It is important to know the symptoms of a TIA and what to do. This can help prevent a major stroke or death. What are the causes? A TIA is caused by a temporary blockage in an artery in the brain or neck (carotid artery). The blockage does not allow the brain to get the blood supply it needs and can cause different symptoms. The blockage can be caused by either:  A blood clot.  Fatty buildup (plaque) in a neck or brain artery.  What increases the risk?  High blood pressure (hypertension).  High cholesterol.  Diabetes mellitus.  Heart disease.  The buildup of plaque in the blood vessels (peripheral artery disease or atherosclerosis).  The buildup of plaque in the blood vessels that provide blood and oxygen to the brain (carotid artery stenosis).  An abnormal heart rhythm (atrial fibrillation).  Obesity.  Using any tobacco products, including cigarettes, chewing tobacco, or electronic cigarettes.  Taking oral contraceptives, especially in combination with using tobacco.  Physical inactivity.  A diet high in fats, salt (sodium), and calories.  Excessive alcohol use.  Use of illegal drugs (especially cocaine and methamphetamine).  Being male.  Being African American.  Being over the age of 25 years.  Family history of stroke.  Previous history of blood clots, stroke, TIA, or heart attack.  Sickle cell disease. What are the signs or symptoms? TIA symptoms are the same as a stroke but are temporary. These symptoms usually develop suddenly, or may be newly present upon waking from sleep:  Sudden weakness or numbness of the face, arm, or leg, especially on one side of the body.  Sudden trouble walking or difficulty moving  arms or legs.  Sudden confusion.  Sudden personality changes.  Trouble speaking (aphasia) or understanding.  Difficulty swallowing.  Sudden trouble seeing in one or both eyes.  Double vision.  Dizziness.  Loss of balance or coordination.  Sudden severe headache with no known cause.  Trouble reading or writing.  Loss of bowel or bladder control.  Loss of consciousness.  How is this diagnosed? Your health care provider may be able to determine the presence or absence of a TIA based on your symptoms, history, and physical exam. CT scan of the brain is usually performed to help identify a TIA. Other tests may include:  Electrocardiography (ECG).  Continuous heart monitoring.  Echocardiography.  Carotid ultrasonography.  MRI.  A scan of the brain circulation.  Blood tests.  How is this treated? Since the symptoms of TIA are the same as a stroke, it is important to seek treatment as soon as possible. You may need a medicine to dissolve a blood clot (thrombolytic) if that is the cause of the TIA. This medicine cannot be given if too much time has passed. Treatment may also include:  Rest, oxygen, fluids through an IV tube, and medicines to thin the blood (anticoagulants).  Measures will be taken to prevent short-term and long-term complications, including infection from breathing foreign material into the lungs (aspiration pneumonia), blood clots in the legs, and falls.  Procedures to either remove plaque in the carotid arteries or dilate carotid arteries that have narrowed due to plaque. Those procedures are: ? Carotid endarterectomy. ? Carotid angioplasty and stenting.  Medicines  and diet may be used to address diabetes, high blood pressure, and other underlying risk factors.  Follow these instructions at home:  Take medicines only as directed by your health care provider. Follow the directions carefully. Medicines may be used to control risk factors for a stroke.  Be sure you understand all your medicine instructions.  You may be told to take aspirin or the anticoagulant warfarin. Warfarin needs to be taken exactly as instructed. ? Taking too much or too little warfarin is dangerous. Too much warfarin increases the risk of bleeding. Too little warfarin continues to allow the risk for blood clots. While taking warfarin, you will need to have regular blood tests to measure your blood clotting time. A PT blood test measures how long it takes for blood to clot. Your PT is used to calculate another value called an INR. Your PT and INR help your health care provider to adjust your dose of warfarin. The dose can change for many reasons. It is critically important that you take warfarin exactly as prescribed. ? Many foods, especially foods high in vitamin K can interfere with warfarin and affect the PT and INR. Foods high in vitamin K include spinach, kale, broccoli, cabbage, collard and turnip greens, Brussels sprouts, peas, cauliflower, seaweed, and parsley, as well as beef and pork liver, green tea, and soybean oil. You should eat a consistent amount of foods high in vitamin K. Avoid major changes in your diet, or notify your health care provider before changing your diet. Arrange a visit with a dietitian to answer your questions. ? Many medicines can interfere with warfarin and affect the PT and INR. You must tell your health care provider about any and all medicines you take; this includes all vitamins and supplements. Be especially cautious with aspirin and anti-inflammatory medicines. Do not take or discontinue any prescribed or over-the-counter medicine except on the advice of your health care provider or pharmacist. ? Warfarin can have side effects, such as excessive bruising or bleeding. You will need to hold pressure over cuts for longer than usual. Your health care provider or pharmacist will discuss other potential side effects. ? Avoid sports or activities that  may cause injury or bleeding. ? Be careful when shaving, flossing your teeth, or handling sharp objects. ? Alcohol can change the body's ability to handle warfarin. It is best to avoid alcoholic drinks or consume only very small amounts while taking warfarin. Notify your health care provider if you change your alcohol intake. ? Notify your dentist or other health care providers before procedures.  Eat a diet that includes 5 or more servings of fruits and vegetables each day. This may reduce the risk of stroke. Certain diets may be prescribed to address high blood pressure, high cholesterol, diabetes, or obesity. ? A diet low in sodium, saturated fat, trans fat, and cholesterol is recommended to manage high blood pressure. ? A diet low in saturated fat, trans fat, and cholesterol, and high in fiber may control cholesterol levels. ? A controlled-carbohydrate, controlled-sugar diet is recommended to manage diabetes. ? A reduced-calorie diet that is low in sodium, saturated fat, trans fat, and cholesterol is recommended to manage obesity.  Maintain a healthy weight.  Stay physically active. It is recommended that you get at least 30 minutes of activity on most or all days.  Do not use any tobacco products, including cigarettes, chewing tobacco, or electronic cigarettes. If you need help quitting, ask your health care provider.  Limit alcohol intake  to no more than 1 drink per day for nonpregnant women and 2 drinks per day for men. One drink equals 12 ounces of beer, 5 ounces of wine, or 1 ounces of hard liquor.  Do not abuse drugs.  A safe home environment is important to reduce the risk of falls. Your health care provider may arrange for specialists to evaluate your home. Having grab bars in the bedroom and bathroom is often important. Your health care provider may arrange for equipment to be used at home, such as raised toilets and a seat for the shower.  Follow all instructions for follow-up  with your health care provider. This is very important. This includes any referrals and lab tests. Proper follow-up can prevent a stroke or another TIA from occurring. How is this prevented? The risk of a TIA can be decreased by appropriately treating high blood pressure, high cholesterol, diabetes, heart disease, and obesity, and by quitting smoking, limiting alcohol, and staying physically active. Contact a health care provider if:  You have personality changes.  You have difficulty swallowing.  You are seeing double.  You have dizziness.  You have a fever. Get help right away if: Any of the following symptoms may represent a serious problem that is an emergency. Do not wait to see if the symptoms will go away. Get medical help right away. Call your local emergency services (911 in [place] Do not drive yourself to the hospital.  You have sudden weakness or numbness of the face, arm, or leg, especially on one side of the body.  You have sudden trouble walking or difficulty moving arms or legs.  You have sudden confusion.  You have trouble speaking (aphasia) or understanding.  You have sudden trouble seeing in one or both eyes.  You have a loss of balance or coordination.  You have a sudden, severe headache with no known cause.  You have new chest pain or an irregular heartbeat.  You have a partial or total loss of consciousness.  This information is not intended to replace advice given to you by your health care provider. Make sure you discuss any questions you have with your health care provider. Document Released: 06/08/2005 Document Revised: 05/02/2016 Document Reviewed: 12/04/2013 Elsevier Interactive Patient Education  2017 [employer].

## 2017-06-25 NOTE — Evaluation (Signed)
Physical Therapy Evaluation Patient Details Name: ARMANII PRESSNELL MRN: [id number] DOB: August 21, 1946 Today's Date: 06/25/2017   History of Present Illness  71 y.o. 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 He also was yelling at the customer and confused.  Patient was brought to the emergency room his initial CT head did not reveal any stroke.  To have MRI today.  Clinical Impression  Pt did well with mobility/ambulation though he did have some unsteadiness on steps w/o UE use; he reports this to be his baseline and does not feel that he needs further PT intervention. Pt was able to easily circumambulate the nurses' station and did fine with steps when using his UEs.  He endorses some chronic minimal slurring but no swallowing, coordination, sensation or vision issues.  He reports feeling back to baseline and is eager to get home.  Pt does not require further PT intervention.     Follow Up Recommendations No PT follow up    Equipment Recommendations       Recommendations for Other Services       Precautions / Restrictions Precautions Precautions: Fall Restrictions Weight Bearing Restrictions: No      Mobility  Bed Mobility Overal bed mobility: Independent             General bed mobility comments: Pt able to get to EOB w/o assist, w/o issue  Transfers Overall transfer level: Independent               General transfer comment: Pt able to get to standing w/o no hesitation, no LOBs  Ambulation/Gait Ambulation/Gait assistance: Modified independent (Device/Increase time) Ambulation Distance (Feet): 250 Feet Assistive device: None       General Gait Details: Pt with somewhat slow, but consistent and safe ambulation.  He reports he is near his  baseline.  Stairs Stairs: Yes Stairs assistance: Supervision Stair Management: No rails;One rail Right Number of Stairs: 14 General stair comments: Pt was able to easily negotiate up/down steps with rail, he did have some unsteadiness simulating getting in/out of home (3 steps, no rail) but did not need direct assist to maintain balance, etc  Wheelchair Mobility    Modified Rankin (Stroke Patients Only)       Balance Overall balance assessment: Modified Independent                                           Pertinent Vitals/Pain Pain Assessment: No/denies pain    Home Living Family/patient expects to be discharged to:: Private residence Living Arrangements: Spouse/significant other Available Help at Discharge: Family;Available PRN/intermittently Type of Home: House Home Access: Stairs to enter Entrance Stairs-Rails: None Entrance Stairs-Number of Steps: 3 Home Layout: Two level;Bed/bath upstairs Home Equipment: Cane - single point;Adaptive equipment;Bedside commode;Walker - 2 wheels;Shower seat      Prior Function Level of Independence: Independent               Hand Dominance   Dominant Hand: Right    Extremity/Trunk Assessment   Upper Extremity Assessment Upper Extremity Assessment: Overall WFL for tasks assessed    Lower Extremity Assessment Lower Extremity Assessment: Overall WFL for tasks assessed (chronic b/l LE neuropathy)  Communication   Communication: Expressive difficulties  Cognition Arousal/Alertness: Awake/alert Behavior During Therapy: WFL for tasks assessed/performed Overall Cognitive Status: Within Functional Limits for tasks assessed                                        General Comments      Exercises     Assessment/Plan    PT Assessment Patent does not need any further PT services  PT Problem List         PT Treatment Interventions      PT Goals (Current goals can be found in  the Care Plan section)  Acute Rehab PT Goals Patient Stated Goal: go home, get back to work PT Goal Formulation: All assessment and education complete, DC therapy Potential to Achieve Goals: Good    Frequency     Barriers to discharge        Co-evaluation               AM-PAC PT "6 Clicks" Daily Activity  Outcome Measure Difficulty turning over in bed (including adjusting bedclothes, sheets and blankets)?: None Difficulty moving from lying on back to sitting on the side of the bed? : None Difficulty sitting down on and standing up from a chair with arms (e.g., wheelchair, bedside commode, etc,.)?: None Help needed moving to and from a bed to chair (including a wheelchair)?: None Help needed walking in hospital room?: None Help needed climbing 3-5 steps with a railing? : A Little 6 Click Score: 23    End of Session Equipment Utilized During Treatment: Gait belt Activity Tolerance: Patient tolerated treatment well Patient left: with bed alarm set;with call bell/phone within reach Nurse Communication: Mobility status PT Visit Diagnosis: Unsteadiness on feet (R26.81);Muscle weakness (generalized) (M62.81);Difficulty in walking, not elsewhere classified (R26.2)    Time: 3016-0109 PT Time Calculation (min) (ACUTE ONLY): 16 min   Charges:   PT Evaluation $PT Eval Low Complexity: 1 Low     PT G Codes:   PT G-Codes **NOT FOR INPATIENT CLASS** Functional Assessment Tool Used: AM-PAC 6 Clicks Basic Mobility Functional Limitation: Mobility: Walking and moving around Mobility: Walking and Moving Around Current Status (N2355): At least 1 percent but less than 20 percent impaired, limited or restricted Mobility: Walking and Moving Around Goal Status [phone]): At least 1 percent but less than 20 percent impaired, limited or restricted Mobility: Walking and Moving Around Discharge Status [phone]): At least 1 percent but less than 20 percent impaired, limited or restricted    Kreg Shropshire, DPT 06/25/2017, 2:24 PM

## 2017-06-25 NOTE — Progress Notes (Deleted)
PT Cancellation Note  Patient Details Name: Patrick Turner MRN: [id number] DOB: November 08, 1945   Cancelled Treatment:    Reason Eval/Treat Not Completed: PT screened, no needs identified, will sign off Nursing reports pt was able to walk around the nurses' station X 3 safety and with O2 staying in the 90s. Spoke with pt who feels that in a day or 2 he will have no issues with going home with wife and easing back into he work.  PT orders will be completed, no need identified with screening.   Kreg Shropshire, DPT 06/25/2017, 1:29 PM

## 2017-06-25 NOTE — ED Provider Notes (Signed)
[facility] Emergency Department Provider Note   ____________________________________________   First MD Initiated Contact with Patient 06/25/17 [phone]     (approximate)  I have reviewed the triage vital signs and the nursing notes.   HISTORY  Chief Complaint Altered Mental Status and Code Stroke    HPI Patrick Turner is a 71 y.o. male who comes into the hospital today with some altered mental status. The patient was found by a customer at work standing and staring. The patient was also crying and unable to speak. We are unsure when he was last seen normal. EMS states that they're concerned about a stroke. The patient is having some difficulty speaking. The patient did start speaking and states that customer threatened him. The fire department was called at about 322.The patient can only state that he messed upbut according to staff who is familiar with the patient  his speech is very slurred and incoherent.   Past Medical History:  Diagnosis Date  . Altered mental status   . Anxiety   . Arthritis   . CRD (chronic renal disease)   . Depression   . Diabetes mellitus without complication ([place])   . GERD (gastroesophageal reflux disease)   . Hypercholesteremia   . Hypertension   . Neuropathy   . Sleep apnea   . Sleep terror    per patient this year per patient     Patient Active Problem List   Diagnosis Date Noted  . TIA (transient ischemic attack) 07/12/2016  . 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 Acute diarrhea 05/30/2016  . Acute encephalopathy 05/30/2016  . Anemia 05/30/2016  . Acute on chronic renal insufficiency 05/30/2016  . Diarrhea 05/30/2016  . Unspecified transient cerebral ischemia 05/29/2012    Past Surgical History:  Procedure Laterality Date  . APPENDECTOMY    . CIRCUMCISION    . KNEE ARTHROPLASTY Right 06/27/2016   Procedure: COMPUTER ASSISTED TOTAL KNEE ARTHROPLASTY;  Surgeon: Dereck Leep, MD;  Location: [facility] ORS;   Service: Orthopedics;  Laterality: Right;  . KNEE ARTHROSCOPY Right   . TONSILLECTOMY      Prior to Admission medications   Medication Sig Start Date End Date Taking? Authorizing Provider  atorvastatin (LIPITOR) 80 MG tablet Take 80 mg by mouth at bedtime. 12/14/15   [provider]  clonazePAM (KLONOPIN) 1 MG tablet Take 0.5 mg by mouth at bedtime. 12/18/15   [provider]  enoxaparin (LOVENOX) 40 MG/0.4ML injection Inject 0.4 mLs (40 mg total) into the skin daily. 06/29/16 07/13/16  Watt Climes, PA  gabapentin (NEURONTIN) 800 MG tablet Take 800 mg by mouth 2 (two) times daily.     [provider]  insulin aspart (NOVOLOG) 100 UNIT/ML injection Inject 5 Units into the skin 2 (two) times daily before a meal. Morning and dinner    [provider]  LEVEMIR 100 UNIT/ML injection Inject 22 Units into the skin at bedtime.  01/03/16   [provider]  lisinopril-hydrochlorothiazide (PRINZIDE,ZESTORETIC) 20-12.5 MG per tablet Take 1 tablet by mouth daily.     [provider]  metFORMIN (GLUCOPHAGE) 500 MG tablet Take 1,000 mg by mouth 2 (two) times daily with a meal.    [provider]  omeprazole (PRILOSEC) 40 MG capsule Take 40 mg by mouth every morning.    [provider]  oxyCODONE (OXY IR/ROXICODONE) 5 MG immediate release tablet Take 1-2 tablets (5-10 mg total) by mouth every 4 (four) hours as needed for  severe pain or breakthrough pain. 06/29/16   Watt Climes, PA  PARoxetine (PAXIL) 40 MG tablet Take 40 mg by mouth at bedtime.    [provider]  pioglitazone (ACTOS) 15 MG tablet Take 15 mg by mouth daily. 11/24/15   [provider]  QUEtiapine (SEROQUEL) 100 MG tablet Take 100 mg by mouth at bedtime. 12/22/15   [provider]  topiramate (TOPAMAX) 100 MG tablet Take 100 mg by mouth 2 (two) times daily.    [provider]  traMADol (ULTRAM) 50 MG tablet Take 1-2 tablets (50-100 mg total) by  mouth every 4 (four) hours as needed for moderate pain. 06/29/16   Watt Climes, PA  vitamin B-12 (CYANOCOBALAMIN) 1000 MCG tablet Take 1,000 mcg by mouth daily.    [provider]    Allergies Levemir [insulin detemir]; Penicillins; and Tavist [clemastine]  Family History  Problem Relation Age of Onset  . Diabetes Mother   . Heart disease Mother   . Cancer Father   . COPD Father     Social History Social History  Substance Use Topics  . Smoking status: Never Smoker  . Smokeless tobacco: Never Used  . Alcohol use Yes     Comment: rare    Review of Systems  Constitutional: No fever/chills Eyes: No visual changes. ENT: No sore throat. Cardiovascular: Denies chest pain. Respiratory: Denies shortness of breath. Gastrointestinal: No abdominal pain.  No nausea, no vomiting.  No diarrhea.  No constipation. Genitourinary: Negative for dysuria. Musculoskeletal: Negative for back pain. Skin: Negative for rash. Neurological: slurred speech, altered mental status   ____________________________________________   PHYSICAL EXAM:  VITAL SIGNS: ED Triage Vitals  Enc Vitals Group     BP 06/25/17 0415 (!) 151/64     Pulse Rate 06/25/17 0351 84     Resp 06/25/17 0351 10     Temp 06/25/17 0426 97.6 F (36.4 C)     Temp Source 06/25/17 0426 Oral     SpO2 06/25/17 0351 100 %     Weight --      Height --      Head Circumference --      Peak Flow --      Pain Score --      Pain Loc --      Pain Edu? --      Excl. in [place]? --     Constitutional: Alert and oriented. Well appearing and in moderate distress. Eyes: Conjunctivae are normal. PERRL. EOMI. Head: Atraumatic. Nose: No congestion/rhinnorhea. Mouth/Throat: Mucous membranes are moist.  Oropharynx non-erythematous. Cardiovascular: Normal rate, regular rhythm. Grossly normal heart sounds.  Good peripheral circulation. Respiratory: Normal respiratory effort.  No retractions. Lungs CTAB. Gastrointestinal: Soft and  nontender. No distention.  Musculoskeletal: No lower extremity tenderness nor edema.   Neurologic:  slurred speech with some broken language, cranial nerves II through XII are grossly intact strength is 5 out of 5 in upper and lower extremities, patient has some mild dysmetria on right upper extremity, no pronator drift, no difficulty with rapid alternating movement. Skin:  Skin is warm, dry and intact.  Psychiatric: Mood and affect are normal.   ____________________________________________   LABS (all labs ordered are listed, but only abnormal results are displayed)  Labs Reviewed  CBC - Abnormal; Notable for the following:       Result Value   RBC 3.96 (*)    Hemoglobin 11.3 (*)    HCT 34.5 (*)    RDW 15.2 (*)  All other components within normal limits  COMPREHENSIVE METABOLIC PANEL - Abnormal; Notable for the following:    Glucose, Bld 274 (*)    BUN 27 (*)    Creatinine, Ser 1.47 (*)    ALT 13 (*)    GFR calc non Af Amer 46 (*)    GFR calc Af Amer 54 (*)    All other components within normal limits  GLUCOSE, CAPILLARY - Abnormal; Notable for the following:    Glucose-Capillary 238 (*)    All other components within normal limits  PROTIME-INR  APTT  DIFFERENTIAL  TROPONIN I  CBC  CREATININE, SERUM  CBG MONITORING, ED   ____________________________________________  EKG  ED ECG REPORT I, Loney Hering, the attending physician, personally viewed and interpreted this ECG.   Date: 06/25/2017  EKG Time: 351  Rate: 83  Rhythm: normal sinus rhythm  Axis: normal  Intervals:none  ST&T Change: none  ____________________________________________  RADIOLOGY  Ct Head Code Stroke Wo Contrast  Result Date: 06/25/2017 CLINICAL DATA:  Code stroke. Initial evaluation for acute altered mental status. EXAM: CT HEAD WITHOUT CONTRAST TECHNIQUE: Contiguous axial images were obtained from the base of the skull through the vertex without intravenous contrast. COMPARISON:   Prior CT from 08/19/2016. FINDINGS: Brain: Age-related cerebral atrophy with mild chronic small vessel ischemic disease. 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 No acute intracranial hemorrhage. No evidence for acute large vessel territory infarct. No mass lesion, midline shift or mass effect. No hydrocephalus. No extra-axial fluid collection. Vascular: No hyperdense vessel. Scattered vascular calcifications noted within the carotid siphons. Skull: Scalp soft tissues and calvarium within normal limits. Sinuses/Orbits: Globes and orbital soft tissues within normal limits. Paranasal sinuses and mastoid air cells are clear. Other: None. ASPECTS [facility] Stroke Program Early CT Score) - Ganglionic level infarction (caudate, lentiform nuclei, internal capsule, insula, M1-M3 cortex): 7 - Supraganglionic infarction (M4-M6 cortex): 3 Total score (0-10 with 10 being normal): 10 IMPRESSION: 1. No acute intracranial infarct or other process identified. 2. ASPECTS is 10. 3. Mild age-related cerebral atrophy with chronic small vessel ischemic disease. Critical Value/emergent results were called by telephone at the time of interpretation on 06/25/2017 at 4:24 am to Dr. Charlesetta Ivory , who verbally acknowledged these results. Electronically Signed   By: Jeannine Boga M.D.   On: 06/25/2017 04:25    ____________________________________________   PROCEDURES  Procedure(s) performed: None  Procedures  Critical Care performed: Yes, see critical care note(s)   CRITICAL CARE Performed by: Charlesetta Ivory P   Total critical care time: 30 minutes  Critical care time was exclusive of separately billable procedures and treating other patients.  Critical care was necessary to treat or prevent imminent or life-threatening deterioration.  Critical care was time spent personally by me on the following activities: development of treatment plan with patient and/or surrogate as well as  nursing, discussions with consultants, evaluation of patient's response to treatment, examination of patient, obtaining history from patient or surrogate, ordering and performing treatments and interventions, ordering and review of laboratory studies, ordering and review of radiographic studies, pulse oximetry and re-evaluation of patient's condition.   ____________________________________________   INITIAL IMPRESSION / ASSESSMENT AND PLAN / ED COURSE  As part of my medical decision making, I reviewed the following data within the electronic MEDICAL RECORD NUMBER Notes from prior ED visits and [place] Controlled Substance Database   This is a 71 year old male who comes into the hospital today with some altered  mental status and concern for stroke  My differential diagnosis includes stroke, anxiety, hypoglycemia  I did evaluate the patient's blood work. His glucose was 238 and his hemoglobin and hematocrit were unremarkable. The patient standing was 1.47 which is slightly elevated from his baseline. We did initially send the patient for a CT scan and called a code stroke. The patient has an NIH stroke scale of about 2-3. The patient was seen by the specialist on-call who felt that the patient's symptoms were psych related but given his slurred speech he felt that the patient should be admitted for TIA evaluation. he did not feel that the patient qualified for TPA given his improving symptoms. I did get a phone call from the police officer stating that the patient was normal until about 3:19 AM. They then noted on the video surveillance that the patient was yelling at a customer but his speech was incoherent when listening to the audio visual. After the customer ran away the patient stated there staring. The patient has no other complaints at this time. He will be admitted to the hospitalist service.      ____________________________________________   FINAL CLINICAL IMPRESSION(S) / ED DIAGNOSES  Final  diagnoses:  TIA (transient ischemic attack)  Slurred speech      NEW MEDICATIONS STARTED DURING THIS VISIT:  Current Discharge Medication List       Note:  This document was prepared using Dragon voice recognition software and may include unintentional dictation errors.    Loney Hering, MD 06/25/17 [phone]

## 2017-06-25 NOTE — ED Notes (Signed)
Patient arrived at work at midnight 10/14

## 2017-06-25 NOTE — ED Notes (Signed)
Jenna RN, aware of bed assigned  

## 2017-06-25 NOTE — ED Notes (Signed)
Pt transport to 122
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
## 2017-06-26 ENCOUNTER — Observation Stay
Admit: 2017-06-26 | Discharge: 2017-06-26 | Disposition: A | Discharge status: Home / Self Care | Payer: Medicare HMO | Attending: Internal Medicine | Admitting: Internal Medicine

## 2017-06-26 DIAGNOSIS — G459 Transient cerebral ischemic attack, unspecified: Secondary | ICD-10-CM | POA: Diagnosis not present

## 2017-06-26 DIAGNOSIS — I10 Essential (primary) hypertension: Secondary | ICD-10-CM | POA: Diagnosis not present

## 2017-06-26 DIAGNOSIS — E119 Type 2 diabetes mellitus without complications: Secondary | ICD-10-CM | POA: Diagnosis not present

## 2017-06-26 DIAGNOSIS — E785 Hyperlipidemia, unspecified: Secondary | ICD-10-CM | POA: Diagnosis not present

## 2017-06-26 DIAGNOSIS — F333 Major depressive disorder, recurrent, severe with psychotic symptoms: Secondary | ICD-10-CM | POA: Diagnosis not present

## 2017-06-26 LAB — HEMOGLOBIN A1C
HEMOGLOBIN A1C: 8.3 % — AB (ref 4.8–5.6)
Mean Plasma Glucose: 191.51 mg/dL

## 2017-06-26 LAB — ECHOCARDIOGRAM COMPLETE
HEIGHTINCHES: 65 in
WEIGHTICAEL: 3068.8 [oz_av]

## 2017-06-26 LAB — GLUCOSE, CAPILLARY: Glucose-Capillary: 128 mg/dL — ABNORMAL HIGH (ref 65–99)

## 2017-06-26 NOTE — Progress Notes (Signed)
SLP Cancellation Note  Patient Details Name: Patrick Turner MRN: [id number] DOB: 16-Apr-1946   Cancelled treatment:       Reason Eval/Treat Not Completed:  (pt has discharged from hospital. no ST issues per NSG/pt)    Orinda Kenner, [place], CCC-SLP Rosalin Buster 06/26/2017, 10:26 AM

## 2017-06-26 NOTE — Progress Notes (Signed)
*  PRELIMINARY RESULTS* Echocardiogram 2D Echocardiogram has been performed.  Sherrie Sport 06/26/2017, 9:33 AM

## 2017-06-26 NOTE — Progress Notes (Signed)
Patient ID: Patrick Turner, male   DOB: June 12, 1946, 71 y.o.   MRN: [id number]   [facility] PROGRESS NOTE  Patrick Turner VFI:[id number] DOB: Jun 19, 1946 DOA: 06/25/2017 PCP: Kirk Ruths, MD  HPI/Subjective: Patient feels fine and offers no complaints. No episodes of freezing up or slurred speech since he's been here.  Objective: Vitals:   06/25/17 1959 06/26/17 0444  BP: (!) 151/46 (!) 141/58  Pulse: 65 (!) 55  Resp: 14 15  Temp: 97.8 F (36.6 C) 98 F (36.7 C)  SpO2: 97% 96%    Filed Weights   06/25/17 0643  Weight: 87 kg (191 lb 12.8 oz)    ROS: Review of Systems  Constitutional: Negative for chills and fever.  Eyes: Negative for blurred vision.  Respiratory: Negative for cough and shortness of breath.   Cardiovascular: Negative for chest pain.  Gastrointestinal: Negative for abdominal pain, constipation, diarrhea, nausea and vomiting.  Genitourinary: Negative for dysuria.  Musculoskeletal: Negative for joint pain.  Neurological: Negative for dizziness and headaches.   Exam: Physical Exam  Constitutional: He is oriented to person, place, and time.  HENT:  Nose: No mucosal edema.  Mouth/Throat: No oropharyngeal exudate or posterior oropharyngeal edema.  Eyes: Pupils are equal, round, and reactive to light. Conjunctivae, EOM and lids are normal.  Neck: No JVD present. Carotid bruit is not present. No edema present. No thyroid mass and no thyromegaly present.  Cardiovascular: S1 normal and S2 normal.  Exam reveals no gallop.   No murmur heard. Pulses:      Dorsalis pedis pulses are 2+ on the right side, and 2+ on the left side.  Respiratory: No respiratory distress. He has no wheezes. He has no rhonchi. He has no rales.  GI: Soft. Bowel sounds are normal. There is no tenderness.  Musculoskeletal:       Right shoulder: He exhibits no swelling.  Lymphadenopathy:    He has no cervical adenopathy.  Neurological: He is alert and oriented to person,  place, and time. No cranial nerve deficit.  Power 5 out of 5 bilaterally  Skin: Skin is warm. No rash noted. Nails show no clubbing.  Psychiatric: He has a normal mood and affect.      Data Reviewed: Basic Metabolic Panel:  Recent Labs Lab 06/25/17 0347  NA 139  K 4.2  CL 104  CO2 22  GLUCOSE 274*  BUN 27*  CREATININE 1.47*  CALCIUM 8.9   Liver Function Tests:  Recent Labs Lab 06/25/17 0347  AST 27  ALT 13*  ALKPHOS 99  BILITOT 0.5  PROT 7.3  ALBUMIN 4.0   No results for input(s): LIPASE, AMYLASE in the last 168 hours. No results for input(s): AMMONIA in the last 168 hours. CBC:  Recent Labs Lab 06/25/17 0347  WBC 6.2  NEUTROABS 3.7  HGB 11.3*  HCT 34.5*  MCV 87.0  PLT 258   Cardiac Enzymes:  Recent Labs Lab 06/25/17 0347  TROPONINI <0.03   BNP (last 3 results) No results for input(s): BNP in the last 8760 hours.  ProBNP (last 3 results) No results for input(s): PROBNP in the last 8760 hours.  CBG:  Recent Labs Lab 06/25/17 0839 06/25/17 1143 06/25/17 1654 06/25/17 2037 06/26/17 0741  GLUCAP 114* 176* 145* 154* 128*    No results found for this or any previous visit (from the past 240 hour(s)).   Studies: Mr Brain Wo Contrast  Result Date: 06/25/2017 CLINICAL DATA:  Slurred speech. EXAM: MRI HEAD WITHOUT  CONTRAST MRA HEAD WITHOUT CONTRAST TECHNIQUE: Multiplanar, multiecho pulse sequences of the brain and surrounding structures were obtained without intravenous contrast. Angiographic images of the head were obtained using MRA technique without contrast. COMPARISON:  Head CT from earlier today.  Brain MRI 07/13/2016 FINDINGS: MRI HEAD FINDINGS Brain: No acute infarction, hemorrhage, hydrocephalus, extra-axial collection or mass lesion. Age normal brain volume. Vascular: Arterial findings below. Normal dural venous sinus flow voids. Skull and upper cervical spine: Negative for marrow lesion. Sinuses/Orbits: Negative. MRA HEAD FINDINGS  Symmetric carotid and vertebral arteries. No unusual branching pattern. There is atheromatous type irregularity and moderate narrowing of the left A1 segment. Mild narrowing of the right M1 segment. No branch occlusion or correctable flow limiting stenosis. Questionable bilateral mild P2 branch narrowing. Negative for aneurysm. IMPRESSION: Brain MRI: No acute finding.  Unremarkable appearance of the brain for age. Intracranial MRA: 1. No acute finding. 2. Overall mild intracranial atherosclerosis as described. Electronically Signed   By: Monte Fantasia M.D.   On: 06/25/2017 15:00   US Carotid Bilateral (at [facility] Only)  Result Date: 06/26/2017 CLINICAL DATA:  TIA EXAM: BILATERAL CAROTID DUPLEX ULTRASOUND TECHNIQUE: Pearline Cables scale imaging, color Doppler and duplex ultrasound were performed of bilateral carotid and vertebral arteries in the neck. COMPARISON:  None. FINDINGS: Criteria: Quantification of carotid stenosis is based on velocity parameters that correlate the residual internal carotid diameter with NASCET-based stenosis levels, using the diameter of the distal internal carotid lumen as the denominator for stenosis measurement. The following velocity measurements were obtained: RIGHT ICA:  80 cm/sec CCA:  81 cm/sec SYSTOLIC ICA/CCA RATIO:  1.0 DIASTOLIC ICA/CCA RATIO:  2.2 ECA:  99 cm/sec LEFT ICA:  109 cm/sec CCA:  86 cm/sec SYSTOLIC ICA/CCA RATIO:  1.3 DIASTOLIC ICA/CCA RATIO:  6.5 ECA:  91 cm/sec RIGHT CAROTID ARTERY: Mild irregular mixed plaque in the bulb. Low resistance internal carotid Doppler pattern is preserved. RIGHT VERTEBRAL ARTERY:  Antegrade. LEFT CAROTID ARTERY: Mild calcified plaque in the bulb. Low resistance internal carotid Doppler pattern is preserved. LEFT VERTEBRAL ARTERY:  Antegrade. IMPRESSION: Less than 50% stenosis in the right and left internal carotid arteries. Electronically Signed   By: Marybelle Killings M.D.   On: 06/26/2017 07:28   Mr Jodene Nam Head/brain VW Cm  Result Date:  06/25/2017 CLINICAL DATA:  Slurred speech. EXAM: MRI HEAD WITHOUT CONTRAST MRA HEAD WITHOUT CONTRAST TECHNIQUE: Multiplanar, multiecho pulse sequences of the brain and surrounding structures were obtained without intravenous contrast. Angiographic images of the head were obtained using MRA technique without contrast. COMPARISON:  Head CT from earlier today.  Brain MRI 07/13/2016 FINDINGS: MRI HEAD FINDINGS Brain: No acute infarction, hemorrhage, hydrocephalus, extra-axial collection or mass lesion. Age normal brain volume. Vascular: Arterial findings below. Normal dural venous sinus flow voids. Skull and upper cervical spine: Negative for marrow lesion. Sinuses/Orbits: Negative. MRA HEAD FINDINGS Symmetric carotid and vertebral arteries. No unusual branching pattern. There is atheromatous type irregularity and moderate narrowing of the left A1 segment. Mild narrowing of the right M1 segment. No branch occlusion or correctable flow limiting stenosis. Questionable bilateral mild P2 branch narrowing. Negative for aneurysm. IMPRESSION: Brain MRI: No acute finding.  Unremarkable appearance of the brain for age. Intracranial MRA: 1. No acute finding. 2. Overall mild intracranial atherosclerosis as described. Electronically Signed   By: Monte Fantasia M.D.   On: 06/25/2017 15:00   Ct Head Code Stroke Wo Contrast  Result Date: 06/25/2017 CLINICAL DATA:  Code stroke. Initial evaluation for acute altered mental  status. EXAM: CT HEAD WITHOUT CONTRAST TECHNIQUE: Contiguous axial images were obtained from the base of the skull through the vertex without intravenous contrast. COMPARISON:  Prior CT from 08/19/2016. FINDINGS: Brain: Age-related cerebral atrophy with mild chronic small vessel ischemic disease. 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 No acute intracranial hemorrhage. No evidence for acute large vessel territory infarct. No mass lesion, midline shift or mass effect. No hydrocephalus.  No extra-axial fluid collection. Vascular: No hyperdense vessel. Scattered vascular calcifications noted within the carotid siphons. Skull: Scalp soft tissues and calvarium within normal limits. Sinuses/Orbits: Globes and orbital soft tissues within normal limits. Paranasal sinuses and mastoid air cells are clear. Other: None. ASPECTS [facility] Stroke Program Early CT Score) - Ganglionic level infarction (caudate, lentiform nuclei, internal capsule, insula, M1-M3 cortex): 7 - Supraganglionic infarction (M4-M6 cortex): 3 Total score (0-10 with 10 being normal): 10 IMPRESSION: 1. No acute intracranial infarct or other process identified. 2. ASPECTS is 10. 3. Mild age-related cerebral atrophy with chronic small vessel ischemic disease. Critical Value/emergent results were called by telephone at the time of interpretation on 06/25/2017 at 4:24 am to Dr. Charlesetta Ivory , who verbally acknowledged these results. Electronically Signed   By: Jeannine Boga M.D.   On: 06/25/2017 04:25    Scheduled Meds: . aspirin  300 mg Rectal Daily   Or  . aspirin  325 mg Oral Daily  . atorvastatin  80 mg Oral QHS  . clonazePAM  0.5 mg Oral QHS  . enoxaparin (LOVENOX) injection  40 mg Subcutaneous Q24H  . gabapentin  800 mg Oral BID  . lisinopril  20 mg Oral Daily   And  . hydrochlorothiazide  12.5 mg Oral Daily  . insulin aspart  0-5 Units Subcutaneous QHS  . insulin aspart  0-9 Units Subcutaneous TID WC  . pantoprazole  40 mg Oral Daily  . PARoxetine  40 mg Oral QHS  . QUEtiapine  100 mg Oral QHS  . sodium chloride flush  3 mL Intravenous Q12H  . topiramate  100 mg Oral BID  . vitamin B-12  1,000 mcg Oral Daily   Continuous Infusions: . sodium chloride      Assessment/Plan:   1. Slurred speech and episode of freezing up. This could be a reaction from a very stressful situation at work. Less likely TIA. Aspirin prescribed. Patient already on high-dose Lipitor.  Echocardiogram reviewed and no source of  cardiac emboli. Carotid ultrasound that shows some plaque but not enough to be concerned with at this point. MRI of the brain was negative for acute stroke. MRA of the brain was negative.  Reason for patient not being discharged last night echocardiogram was not done. 2. Essential hypertension on usual medications 3. Hyperlipidemia unspecified on Lipitor 4. Type 2 diabetes mellitus on Levemir 5. Anxiety and patient needs to follow-up with Dr. Thurmond Butts psychiatry as outpatient. No thoughts of hurting himself or other people.  Code Status:     Code Status Orders        Start     Ordered   06/25/17 0647  Full code  Continuous     06/25/17 0646    Code Status History    Date Active Date Inactive Code Status Order ID Comments User Context   07/12/2016  8:22 PM 07/13/2016 11:11 PM Full Code [id number]  Demetrios Loll, MD Inpatient   06/27/2016  4:56 PM 06/29/2016  8:34 PM Full Code [id number]  Dereck Leep, MD Inpatient   05/30/2016  4:39 PM 06/01/2016  3:01 PM Full Code [id number]  Theodoro Grist, MD Inpatient    Advance Directive Documentation     Most Recent Value  Type of Advance Directive  Living will  Pre-existing out of facility DNR order (yellow form or pink MOST form)  -  "MOST" Form in Place?  -      Disposition Plan: home today   Time spent: 20 minutes   Taylor, [place]

## 2017-06-26 NOTE — Progress Notes (Signed)
Pt is being discharged today, discharge paperwork was explained to him, he verified understanding. All belongings were packed and returned to the patient. 0 paper prescriptions were given to the patient. He was rolled out in a wheelchair by staff.

## 2017-06-28 DIAGNOSIS — I779 Disorder of arteries and arterioles, unspecified: Secondary | ICD-10-CM | POA: Diagnosis not present

## 2017-08-16 DIAGNOSIS — Z794 Long term (current) use of insulin: Secondary | ICD-10-CM | POA: Diagnosis not present

## 2017-08-16 DIAGNOSIS — E1165 Type 2 diabetes mellitus with hyperglycemia: Secondary | ICD-10-CM | POA: Diagnosis not present

## 2017-08-16 DIAGNOSIS — Z7984 Long term (current) use of oral hypoglycemic drugs: Secondary | ICD-10-CM | POA: Diagnosis not present

## 2017-08-16 DIAGNOSIS — E113293 Type 2 diabetes mellitus with mild nonproliferative diabetic retinopathy without macular edema, bilateral: Secondary | ICD-10-CM | POA: Diagnosis not present

## 2017-08-23 DIAGNOSIS — E1165 Type 2 diabetes mellitus with hyperglycemia: Secondary | ICD-10-CM | POA: Diagnosis not present

## 2017-08-23 DIAGNOSIS — E1122 Type 2 diabetes mellitus with diabetic chronic kidney disease: Secondary | ICD-10-CM | POA: Diagnosis not present

## 2017-08-23 DIAGNOSIS — E1142 Type 2 diabetes mellitus with diabetic polyneuropathy: Secondary | ICD-10-CM | POA: Diagnosis not present

## 2017-08-23 DIAGNOSIS — N183 Chronic kidney disease, stage 3 (moderate): Secondary | ICD-10-CM | POA: Diagnosis not present

## 2017-08-23 DIAGNOSIS — Z794 Long term (current) use of insulin: Secondary | ICD-10-CM | POA: Diagnosis not present

## 2017-09-27 DIAGNOSIS — F333 Major depressive disorder, recurrent, severe with psychotic symptoms: Secondary | ICD-10-CM | POA: Diagnosis not present

## 2017-10-27 DIAGNOSIS — E1122 Type 2 diabetes mellitus with diabetic chronic kidney disease: Secondary | ICD-10-CM | POA: Diagnosis not present

## 2017-10-27 DIAGNOSIS — M545 Low back pain: Secondary | ICD-10-CM | POA: Diagnosis not present

## 2017-10-27 DIAGNOSIS — M5442 Lumbago with sciatica, left side: Secondary | ICD-10-CM | POA: Diagnosis not present

## 2017-10-27 DIAGNOSIS — N183 Chronic kidney disease, stage 3 (moderate): Secondary | ICD-10-CM | POA: Diagnosis not present

## 2017-11-02 DIAGNOSIS — E1122 Type 2 diabetes mellitus with diabetic chronic kidney disease: Secondary | ICD-10-CM | POA: Diagnosis not present

## 2017-11-02 DIAGNOSIS — E1142 Type 2 diabetes mellitus with diabetic polyneuropathy: Secondary | ICD-10-CM | POA: Diagnosis not present

## 2017-11-02 DIAGNOSIS — F325 Major depressive disorder, single episode, in full remission: Secondary | ICD-10-CM | POA: Diagnosis not present

## 2017-11-02 DIAGNOSIS — E1169 Type 2 diabetes mellitus with other specified complication: Secondary | ICD-10-CM | POA: Diagnosis not present

## 2017-11-02 DIAGNOSIS — I779 Disorder of arteries and arterioles, unspecified: Secondary | ICD-10-CM | POA: Diagnosis not present

## 2017-11-02 DIAGNOSIS — I700 Atherosclerosis of aorta: Secondary | ICD-10-CM | POA: Insufficient documentation

## 2017-11-02 DIAGNOSIS — G4733 Obstructive sleep apnea (adult) (pediatric): Secondary | ICD-10-CM | POA: Diagnosis not present

## 2017-11-02 DIAGNOSIS — I10 Essential (primary) hypertension: Secondary | ICD-10-CM | POA: Diagnosis not present

## 2017-11-02 DIAGNOSIS — G2119 Other drug induced secondary parkinsonism: Secondary | ICD-10-CM | POA: Diagnosis not present

## 2017-11-16 DIAGNOSIS — E1165 Type 2 diabetes mellitus with hyperglycemia: Secondary | ICD-10-CM | POA: Diagnosis not present

## 2017-11-16 DIAGNOSIS — E1122 Type 2 diabetes mellitus with diabetic chronic kidney disease: Secondary | ICD-10-CM | POA: Diagnosis not present

## 2017-11-16 DIAGNOSIS — E1169 Type 2 diabetes mellitus with other specified complication: Secondary | ICD-10-CM | POA: Diagnosis not present

## 2017-11-16 DIAGNOSIS — Z794 Long term (current) use of insulin: Secondary | ICD-10-CM | POA: Diagnosis not present

## 2017-11-16 DIAGNOSIS — I779 Disorder of arteries and arterioles, unspecified: Secondary | ICD-10-CM | POA: Diagnosis not present

## 2017-11-16 DIAGNOSIS — E785 Hyperlipidemia, unspecified: Secondary | ICD-10-CM | POA: Diagnosis not present

## 2017-11-16 DIAGNOSIS — N183 Chronic kidney disease, stage 3 (moderate): Secondary | ICD-10-CM | POA: Diagnosis not present

## 2017-11-23 DIAGNOSIS — N183 Chronic kidney disease, stage 3 (moderate): Secondary | ICD-10-CM | POA: Diagnosis not present

## 2017-11-23 DIAGNOSIS — Z794 Long term (current) use of insulin: Secondary | ICD-10-CM | POA: Diagnosis not present

## 2017-11-23 DIAGNOSIS — E1165 Type 2 diabetes mellitus with hyperglycemia: Secondary | ICD-10-CM | POA: Diagnosis not present

## 2017-11-23 DIAGNOSIS — E1122 Type 2 diabetes mellitus with diabetic chronic kidney disease: Secondary | ICD-10-CM | POA: Diagnosis not present

## 2017-11-23 DIAGNOSIS — E1142 Type 2 diabetes mellitus with diabetic polyneuropathy: Secondary | ICD-10-CM | POA: Diagnosis not present

## 2017-12-22 DIAGNOSIS — F333 Major depressive disorder, recurrent, severe with psychotic symptoms: Secondary | ICD-10-CM | POA: Diagnosis not present

## 2018-01-09 DIAGNOSIS — D2339 Other benign neoplasm of skin of other parts of face: Secondary | ICD-10-CM | POA: Diagnosis not present

## 2018-02-20 DIAGNOSIS — Z0000 Encounter for general adult medical examination without abnormal findings: Secondary | ICD-10-CM | POA: Diagnosis not present

## 2018-02-20 DIAGNOSIS — E1122 Type 2 diabetes mellitus with diabetic chronic kidney disease: Secondary | ICD-10-CM | POA: Diagnosis not present

## 2018-02-20 DIAGNOSIS — E1142 Type 2 diabetes mellitus with diabetic polyneuropathy: Secondary | ICD-10-CM | POA: Diagnosis not present

## 2018-02-20 DIAGNOSIS — F325 Major depressive disorder, single episode, in full remission: Secondary | ICD-10-CM | POA: Diagnosis not present

## 2018-02-20 DIAGNOSIS — E1169 Type 2 diabetes mellitus with other specified complication: Secondary | ICD-10-CM | POA: Diagnosis not present

## 2018-02-20 DIAGNOSIS — Z5181 Encounter for therapeutic drug level monitoring: Secondary | ICD-10-CM | POA: Diagnosis not present

## 2018-02-20 DIAGNOSIS — I700 Atherosclerosis of aorta: Secondary | ICD-10-CM | POA: Diagnosis not present

## 2018-02-20 DIAGNOSIS — I779 Disorder of arteries and arterioles, unspecified: Secondary | ICD-10-CM | POA: Diagnosis not present

## 2018-02-20 DIAGNOSIS — I10 Essential (primary) hypertension: Secondary | ICD-10-CM | POA: Diagnosis not present

## 2018-02-22 DIAGNOSIS — Z794 Long term (current) use of insulin: Secondary | ICD-10-CM | POA: Diagnosis not present

## 2018-02-22 DIAGNOSIS — E1165 Type 2 diabetes mellitus with hyperglycemia: Secondary | ICD-10-CM | POA: Diagnosis not present

## 2018-02-22 DIAGNOSIS — Z125 Encounter for screening for malignant neoplasm of prostate: Secondary | ICD-10-CM | POA: Diagnosis not present

## 2018-02-22 DIAGNOSIS — I779 Disorder of arteries and arterioles, unspecified: Secondary | ICD-10-CM | POA: Diagnosis not present

## 2018-02-22 DIAGNOSIS — E1122 Type 2 diabetes mellitus with diabetic chronic kidney disease: Secondary | ICD-10-CM | POA: Diagnosis not present

## 2018-02-22 DIAGNOSIS — N183 Chronic kidney disease, stage 3 (moderate): Secondary | ICD-10-CM | POA: Diagnosis not present

## 2018-02-22 DIAGNOSIS — I700 Atherosclerosis of aorta: Secondary | ICD-10-CM | POA: Diagnosis not present

## 2018-02-22 DIAGNOSIS — Z0000 Encounter for general adult medical examination without abnormal findings: Secondary | ICD-10-CM | POA: Diagnosis not present

## 2018-02-23 ENCOUNTER — Emergency Department: Payer: Medicare HMO

## 2018-02-23 ENCOUNTER — Observation Stay (HOSPITAL_BASED_OUTPATIENT_CLINIC_OR_DEPARTMENT_OTHER)
Admit: 2018-02-23 | Discharge: 2018-02-23 | Disposition: A | Discharge status: Home / Self Care | Payer: Medicare HMO | Attending: Internal Medicine | Admitting: Internal Medicine

## 2018-02-23 ENCOUNTER — Observation Stay: Payer: Medicare HMO

## 2018-02-23 ENCOUNTER — Observation Stay
Admission: EM | Admit: 2018-02-23 | Discharge: 2018-02-23 | Disposition: A | Discharge status: Home / Self Care | Payer: Medicare HMO | Attending: Specialist | Admitting: Specialist

## 2018-02-23 DIAGNOSIS — Z7982 Long term (current) use of aspirin: Secondary | ICD-10-CM | POA: Diagnosis not present

## 2018-02-23 DIAGNOSIS — I779 Disorder of arteries and arterioles, unspecified: Secondary | ICD-10-CM | POA: Diagnosis present

## 2018-02-23 DIAGNOSIS — Z8673 Personal history of transient ischemic attack (TIA), and cerebral infarction without residual deficits: Secondary | ICD-10-CM | POA: Diagnosis not present

## 2018-02-23 DIAGNOSIS — Z79899 Other long term (current) drug therapy: Secondary | ICD-10-CM | POA: Insufficient documentation

## 2018-02-23 DIAGNOSIS — Z880 Allergy status to penicillin: Secondary | ICD-10-CM | POA: Insufficient documentation

## 2018-02-23 DIAGNOSIS — Z794 Long term (current) use of insulin: Secondary | ICD-10-CM | POA: Diagnosis not present

## 2018-02-23 DIAGNOSIS — E1140 Type 2 diabetes mellitus with diabetic neuropathy, unspecified: Secondary | ICD-10-CM | POA: Insufficient documentation

## 2018-02-23 DIAGNOSIS — E7800 Pure hypercholesterolemia, unspecified: Secondary | ICD-10-CM | POA: Diagnosis not present

## 2018-02-23 DIAGNOSIS — N189 Chronic kidney disease, unspecified: Secondary | ICD-10-CM | POA: Diagnosis not present

## 2018-02-23 DIAGNOSIS — K219 Gastro-esophageal reflux disease without esophagitis: Secondary | ICD-10-CM | POA: Insufficient documentation

## 2018-02-23 DIAGNOSIS — G459 Transient cerebral ischemic attack, unspecified: Principal | ICD-10-CM | POA: Insufficient documentation

## 2018-02-23 DIAGNOSIS — I129 Hypertensive chronic kidney disease with stage 1 through stage 4 chronic kidney disease, or unspecified chronic kidney disease: Secondary | ICD-10-CM | POA: Diagnosis not present

## 2018-02-23 DIAGNOSIS — E119 Type 2 diabetes mellitus without complications: Secondary | ICD-10-CM

## 2018-02-23 DIAGNOSIS — M6281 Muscle weakness (generalized): Secondary | ICD-10-CM | POA: Diagnosis not present

## 2018-02-23 DIAGNOSIS — I5030 Unspecified diastolic (congestive) heart failure: Secondary | ICD-10-CM

## 2018-02-23 DIAGNOSIS — F329 Major depressive disorder, single episode, unspecified: Secondary | ICD-10-CM | POA: Diagnosis not present

## 2018-02-23 DIAGNOSIS — I6523 Occlusion and stenosis of bilateral carotid arteries: Secondary | ICD-10-CM | POA: Diagnosis not present

## 2018-02-23 DIAGNOSIS — E1122 Type 2 diabetes mellitus with diabetic chronic kidney disease: Secondary | ICD-10-CM | POA: Diagnosis not present

## 2018-02-23 DIAGNOSIS — F419 Anxiety disorder, unspecified: Secondary | ICD-10-CM | POA: Insufficient documentation

## 2018-02-23 DIAGNOSIS — R4182 Altered mental status, unspecified: Secondary | ICD-10-CM | POA: Diagnosis not present

## 2018-02-23 DIAGNOSIS — R4781 Slurred speech: Secondary | ICD-10-CM | POA: Diagnosis not present

## 2018-02-23 DIAGNOSIS — Z96651 Presence of right artificial knee joint: Secondary | ICD-10-CM | POA: Diagnosis not present

## 2018-02-23 DIAGNOSIS — E785 Hyperlipidemia, unspecified: Secondary | ICD-10-CM | POA: Diagnosis not present

## 2018-02-23 DIAGNOSIS — I10 Essential (primary) hypertension: Secondary | ICD-10-CM | POA: Diagnosis not present

## 2018-02-23 LAB — URINALYSIS, COMPLETE (UACMP) WITH MICROSCOPIC
BACTERIA UA: NONE SEEN
BILIRUBIN URINE: NEGATIVE
Glucose, UA: 50 mg/dL — AB
Hgb urine dipstick: NEGATIVE
Ketones, ur: NEGATIVE mg/dL
LEUKOCYTES UA: NEGATIVE
Nitrite: NEGATIVE
PROTEIN: NEGATIVE mg/dL
SPECIFIC GRAVITY, URINE: 1.01 (ref 1.005–1.030)
SQUAMOUS EPITHELIAL / LPF: NONE SEEN (ref 0–5)
pH: 7 (ref 5.0–8.0)

## 2018-02-23 LAB — CBC
HEMATOCRIT: 33.9 % — AB (ref 40.0–52.0)
HEMOGLOBIN: 11.1 g/dL — AB (ref 13.0–18.0)
MCH: 27.6 pg (ref 26.0–34.0)
MCHC: 32.6 g/dL (ref 32.0–36.0)
MCV: 84.7 fL (ref 80.0–100.0)
Platelets: 258 10*3/uL (ref 150–440)
RBC: 4.01 MIL/uL — AB (ref 4.40–5.90)
RDW: 16.8 % — ABNORMAL HIGH (ref 11.5–14.5)
WBC: 6.4 10*3/uL (ref 3.8–10.6)

## 2018-02-23 LAB — COMPREHENSIVE METABOLIC PANEL
ALBUMIN: 4 g/dL (ref 3.5–5.0)
ALK PHOS: 75 U/L (ref 38–126)
ALT: 16 U/L — ABNORMAL LOW (ref 17–63)
AST: 28 U/L (ref 15–41)
Anion gap: 10 (ref 5–15)
BUN: 43 mg/dL — AB (ref 6–20)
CALCIUM: 8.9 mg/dL (ref 8.9–10.3)
CO2: 22 mmol/L (ref 22–32)
Chloride: 104 mmol/L (ref 101–111)
Creatinine, Ser: 1.55 mg/dL — ABNORMAL HIGH (ref 0.61–1.24)
GFR calc Af Amer: 50 mL/min — ABNORMAL LOW (ref >60)
GFR calc non Af Amer: 43 mL/min — ABNORMAL LOW (ref >60)
GLUCOSE: 337 mg/dL — AB (ref 65–99)
Potassium: 4.9 mmol/L (ref 3.5–5.1)
Sodium: 136 mmol/L (ref 135–145)
Total Bilirubin: 0.5 mg/dL (ref 0.3–1.2)
Total Protein: 7.2 g/dL (ref 6.5–8.1)

## 2018-02-23 LAB — GLUCOSE, CAPILLARY
GLUCOSE-CAPILLARY: 283 mg/dL — AB (ref 65–99)
Glucose-Capillary: 171 mg/dL — ABNORMAL HIGH (ref 65–99)
Glucose-Capillary: 142 mg/dL — ABNORMAL HIGH (ref 65–99)
Glucose-Capillary: 169 mg/dL — ABNORMAL HIGH (ref 65–99)
Glucose-Capillary: 146 mg/dL — ABNORMAL HIGH (ref 65–99)

## 2018-02-23 LAB — ECHOCARDIOGRAM COMPLETE
HEIGHTINCHES: 64 in
WEIGHTICAEL: 3008 [oz_av]

## 2018-02-23 LAB — TROPONIN I: Troponin I: <0.03 ng/mL (ref <0.03)

## 2018-02-23 LAB — PROTIME-INR
INR: 1.02
Prothrombin Time: 13.3 seconds (ref 11.4–15.2)

## 2018-02-23 LAB — ETHANOL: Alcohol, Ethyl (B): <10 mg/dL (ref <10)

## 2018-02-23 LAB — APTT: APTT: 28 s (ref 24–36)

## 2018-02-23 MED ORDER — ACETAMINOPHEN 325 MG PO TABS: 650.0000 mg | ORAL_TABLET | ORAL | Status: DC | PRN

## 2018-02-23 MED ORDER — ASPIRIN EC 325 MG PO TBEC
DELAYED_RELEASE_TABLET | ORAL | Status: AC
  Filled 2018-02-23: qty 1

## 2018-02-23 MED ORDER — ASPIRIN 325 MG PO TABS
325.0000 mg | ORAL_TABLET | Freq: Every day | ORAL | Status: DC
  Administered 2018-02-23: 10:00:00 325 mg via ORAL
  Filled 2018-02-23: qty 1

## 2018-02-23 MED ORDER — ENOXAPARIN SODIUM 40 MG/0.4ML ~~LOC~~ SOLN: 40.0000 mg | SUBCUTANEOUS | Status: DC

## 2018-02-23 MED ORDER — TOPIRAMATE 100 MG PO TABS
100.0000 mg | ORAL_TABLET | Freq: Two times a day (BID) | ORAL | Status: DC
  Administered 2018-02-23: 10:00:00 100 mg via ORAL
  Filled 2018-02-23 (×2): qty 1

## 2018-02-23 MED ORDER — ACETAMINOPHEN 160 MG/5ML PO SOLN: 650.0000 mg | ORAL | Status: DC | PRN

## 2018-02-23 MED ORDER — PAROXETINE HCL 20 MG PO TABS
40.0000 mg | ORAL_TABLET | Freq: Every day | ORAL | Status: DC
  Filled 2018-02-23: qty 2

## 2018-02-23 MED ORDER — PANTOPRAZOLE SODIUM 40 MG PO TBEC
40.0000 mg | DELAYED_RELEASE_TABLET | Freq: Every day | ORAL | Status: DC
  Administered 2018-02-23: 10:00:00 40 mg via ORAL
  Filled 2018-02-23: qty 1

## 2018-02-23 MED ORDER — ACETAMINOPHEN 650 MG RE SUPP: 650.0000 mg | RECTAL | Status: DC | PRN

## 2018-02-23 MED ORDER — QUETIAPINE FUMARATE 25 MG PO TABS: 100.0000 mg | ORAL_TABLET | Freq: Every day | ORAL | Status: DC

## 2018-02-23 MED ORDER — INSULIN ASPART 100 UNIT/ML ~~LOC~~ SOLN
0.0000 [IU] | Freq: Three times a day (TID) | SUBCUTANEOUS | Status: DC
  Administered 2018-02-23: 13:00:00 2 [IU] via SUBCUTANEOUS
  Administered 2018-02-23: 1 [IU] via SUBCUTANEOUS
  Filled 2018-02-23 (×2): qty 1

## 2018-02-23 MED ORDER — ATORVASTATIN CALCIUM 20 MG PO TABS: 80.0000 mg | ORAL_TABLET | Freq: Every day | ORAL | Status: DC

## 2018-02-23 MED ORDER — ASPIRIN 81 MG PO TABS: 81.0000 mg | ORAL_TABLET | Freq: Every day | ORAL | Status: AC

## 2018-02-23 MED ORDER — CLOPIDOGREL BISULFATE 75 MG PO TABS: 75.0000 mg | ORAL_TABLET | Freq: Every day | ORAL | 1 refills | Status: AC

## 2018-02-23 MED ORDER — INSULIN ASPART 100 UNIT/ML ~~LOC~~ SOLN: 0.0000 [IU] | Freq: Every day | SUBCUTANEOUS | Status: DC

## 2018-02-23 MED ORDER — STROKE: EARLY STAGES OF RECOVERY BOOK: Freq: Once | Status: DC

## 2018-02-23 NOTE — Evaluation (Signed)
Occupational Therapy Evaluation Patient Details Name: Patrick Turner MRN: [id number] DOB: December 01, 1945 Today's Date: 02/23/2018    History of Present Illness Patient is a 72 year old male admitted for a TIA s/p c/o AMS.  PMH includes DM, Htn, neuropathy and depression.   Clinical Impression   Met pt lying in bed, agreeable to OT treatment this date. At baseline pt is independent and working 3rd shift at [employer]. Pt presents with some delay in speech when responding in conversation, no slurring/abnormalities noted. Pt is independent with bed mobility and supervision A for all functional mobility. Pt completed oral hygiene with set up A while standing at the sink, no LOB noticed and able to open tooth paste and tooth brush package with no difficulties. Pt completed toileting and toilet hygiene with supervision A. Pt reports a history of many falls at home and states it is from prescribed medications (side effects) he has been taking at baseline. Educated on home safety to help pt reduce falls, pt in understanding. 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 Educated pt on potentially using shower chair for first shower to ensure safety and no LOB (for first time closing eyes), pt stated he does not think he will need to use equipment but will keep this information in mind. No further OT needs noted, OT will sign off this date. Reconsult welcomed if changes arise.    Follow Up Recommendations  No OT follow up    Equipment Recommendations  None recommended by OT;Other (comment)(pt has previous home equipment)    Recommendations for Other Services       Precautions / Restrictions Precautions Precautions: Fall Restrictions Weight Bearing Restrictions: No      Mobility Bed Mobility Overal bed mobility: Independent                Transfers Overall transfer level: Needs assistance Equipment used: None Transfers: Sit to/from Stand Sit to Stand:  Supervision         General transfer comment: pt completed functional mobility from sink to BR with supervision A    Balance Overall balance assessment: Needs assistance Sitting-balance support: Feet supported Sitting balance-Leahy Scale: Good     Standing balance support: No upper extremity supported Standing balance-Leahy Scale: Fair                             ADL either performed or assessed with clinical judgement   ADL Overall ADL's : Needs assistance/impaired Eating/Feeding: Independent   Grooming: Set up;Standing Grooming Details (indicate cue type and reason): pt completed oral hygiene standing at the sink with set up A for supply and supervision A for funtional mobility. Pt able to open packages with no difficulty Upper Body Bathing: Modified independent Upper Body Bathing Details (indicate cue type and reason): pt is at baseline with UB bathing, pt has shower seat if feeling off balance Lower Body Bathing: Modified independent Lower Body Bathing Details (indicate cue type and reason): pt has shower chair, but able to reach BLE with increased time. Upper Body Dressing : Independent   Lower Body Dressing: Independent Lower Body Dressing Details (indicate cue type and reason): pt with undergarments donned himself this session Toilet Transfer: Supervision/safety Toilet Transfer Details (indicate cue type and reason): pt completed toilet t/f with supervision A for safety, but anticipate modified independent (used grab bars) Toileting- Clothing Manipulation and Hygiene: Independent   Tub/ Shower Transfer: English as a second language teacher  Details (indicate cue type and reason): educated pt on awareness of balance when closing eyes in shower for the first time and to use the shower seat if needed for first few showers Functional mobility during ADLs: Supervision/safety       Vision Baseline Vision/History: Wears glasses Wears Glasses: At all  times Patient Visual Report: No change from baseline       Perception     Praxis      Pertinent Vitals/Pain Pain Assessment: No/denies pain     Hand Dominance Right   Extremity/Trunk Assessment Upper Extremity Assessment Upper Extremity Assessment: Overall WFL for tasks assessed   Lower Extremity Assessment Lower Extremity Assessment: Overall WFL for tasks assessed       Communication Communication Communication: No difficulties   Cognition Arousal/Alertness: Awake/alert Behavior During Therapy: WFL for tasks assessed/performed Overall Cognitive Status: Within Functional Limits for tasks assessed                                 General Comments: Occasionally slow to respond   General Comments       Exercises     Shoulder Instructions      Home Living Family/patient expects to be discharged to:: Private residence Living Arrangements: Spouse/significant other Available Help at Discharge: Family;Available PRN/intermittently(sons and grandkids) Type of Home: House Home Access: Stairs to enter [employer] of Steps: 3   Home Layout: Two level;Bed/bath upstairs Alternate Level Stairs-Number of Steps: 12   Bathroom Shower/Tub: Teacher, early years/pre: Standard     Home Equipment: Environmental consultant - 2 wheels;Cane - single point;Shower seat   Additional Comments: Pt has not currently been using equipment, has from previous TKA      Prior Functioning/Environment Level of Independence: Independent        Comments: Pt reports working full time on 3rd shift at [employer]        OT Problem List: Decreased strength      OT Treatment/Interventions:      OT Goals(Current goals can be found in the care plan section) Acute Rehab OT Goals Patient Stated Goal: to go home as soon as possible OT Goal Formulation: With patient Time For Goal Achievement: 03/09/18 Potential to Achieve Goals: Good  OT Frequency:     Barriers to D/C:             Co-evaluation              AM-PAC PT "6 Clicks" Daily Activity     Outcome Measure Help from another person eating meals?: None Help from another person taking care of personal grooming?: A Little Help from another person toileting, which includes using toliet, bedpan, or urinal?: A Little Help from another person bathing (including washing, rinsing, drying)?: A Little Help from another person to put on and taking off regular upper body clothing?: None Help from another person to put on and taking off regular lower body clothing?: None 6 Click Score: 21   End of Session Equipment Utilized During Treatment: Gait belt  Activity Tolerance: Patient tolerated treatment well Patient left: in bed;with call bell/phone within reach  OT Visit Diagnosis: History of falling (Z91.81);Muscle weakness (generalized) (M62.81)                Time: 7824-2353 OT Time Calculation (min): 32 min Charges:  OT General Charges $OT Visit: 1 Visit OT Evaluation $OT Eval Low Complexity: 1 Low OT Treatments $Self  Care/Home Management : 8-22 mins G-Codes:     Zenovia Jarred, MSOT, OTR/L  [place] 02/23/2018, 3:22 PM

## 2018-02-23 NOTE — Progress Notes (Signed)
*  PRELIMINARY RESULTS* Echocardiogram 2D Echocardiogram has been performed.  Patrick Turner 02/23/2018, 9:20 AM

## 2018-02-23 NOTE — Procedures (Signed)
ELECTROENCEPHALOGRAM REPORT   Patient: Patrick Turner       Room #: 938H-WE EEG No. ID: [id number] Age: 72 y.o.        Sex: male Referring Physician: Verdell Carmine Report Date:  02/23/2018        Interpreting Physician: Alexis Goodell  History: Patrick Turner is an 72 y.o. male with episodes of confusion and slurred speech  Medications:  Topamax, ASA, Lipitor, Insulin, Protonix, Paxil, Seroquel  Conditions of Recording:  This is a 16 channel EEG carried out with the patient in the awake and briefly drowsy states.  Description:  The waking background activity consists of a low voltage, symmetrical, fairly well organized, 9-10 Hz alpha activity, seen from the parieto-occipital and posterior temporal regions.  Low voltage fast activity, poorly organized, is seen anteriorly and is at times superimposed on more posterior regions.  A mixture of theta and alpha rhythms are seen from the central and temporal regions. The patient drowses briefly with slowing to irregular, low voltage theta and beta activity.   Stage II sleep is not obtained. No epileptiform activity is noted.   Hyperventilation was not performed.  Intermittent photic stimulation was performed but failed to illicit any change in the tracing.   IMPRESSION: Normal electroencephalogram, awake, drowsy and with activation procedures. There are no focal lateralizing or epileptiform features.   Alexis Goodell, MD Neurology [phone] 02/23/2018, 7:06 PM

## 2018-02-23 NOTE — Consult Note (Signed)
   [facility] Tele[facility]urology Consult Services  Date of Service: 02/23/2018  Impression:  1. TIA left hemisphere 2. Small vessel disease    Not a tPA candidate due to: complete resolution of symptoms with NIHSS = 0 Not clearly an NIR candidate due to: no evidence of large vessel occlusion  Differential Diagnosis:   1. Cardioembolic stroke  2. Small vessel disease/lacune  3. Thromboembolic, artery-to-artery mechanism  4. Hypercoagulable state-related infarct  5. Transient ischemic attack  6. Thrombotic mechanism, large artery disease    Comments:   [facility] contacted: [phone] [facility] at bedside: 0345 NIHSS assessment time: 0352  Recommendations:   Inpatient Neurology consultation Stroke evaluation per Neurology/Internal Medicine Discussed with ED MD MRI brain LDL tele a1c TTE DVT proph  permissive htn bedside swallow eval PT/OT/speech ASA  Please call with questions.  ----------------------------------------------------------------------------------------- CC: confusion and slurred speech  History of Present Illness: this is a 72 year old man who was normal until approximately 0100 when he became confused and had slurred speech. He had generalized but no focal weakness. This resolved upon arrival at the hospital. Staff sought him and found some board finding and naming difficulty initially. He is been reported to have had a TIA in the past however he has no knowledge of this. He has a history of hypertension in his blood pressure was 151/65. He is diabetic and is blood sugar is 283. He has no history of coronary disease or atrial fibrillation. He has hyperlipidemia. He is on low-dose aspirin but not anticoagulation.   Diagnostic: CT scan of the brain was reported as showing ASPECTS 10 but there was some underlying white matter disease and lacunar infarction in the posterior putamen on both sides. Blood sugar a second time was recorded 337. His GFR was  only 43.  Exam  Level of consciousness: alert, keenly responsive = 0 Level of consciousness: month and age: both questions correct = 0 Level of consciousness: commands: performs both tasks = 0 Horizontal EOMs: normal = 0 Visual fields: normal = 0 Test facial palsy: normal symmetry = 0 L arm motor drift: no drift for 10 seconds = 0 R arm motor drift: no drift for 10 seconds = 0 L leg motor drift: no drift for five seconds = 0 R leg motor drift: no drift five seconds = 0 Limb ataxia: (FNF/Heel-Shin): no ataxia = 0 Sensation: normal; no sensory loss = 0 Language/Aphasia: normal: no aphasia = 0 Dysarthria: normal = 0 Extinction/Inattention = 0  NIHSS = 0   Medical Decision Making:  - Extensive number of diagnosis or management options are considered above.   - Extensive amount of complex data reviewed.   - High risk of complication and/or morbidity or mortality are associated with differential diagnostic considerations above.  - There may be Uncertain outcome and increased probability of prolonged functional impairment or high probability of severe prolonged functional impairment associated with some of these differential diagnosis.   Medical Data Reviewed:  1.Data reviewed include clinical labs, radiology,  Medical Tests;   2.Tests results discussed w/performing or interpreting physician;   3.Obtaining/reviewing old medical records;  4.Obtaining case history from another source;  5.Independent review of image, tracing or specimen.    Patient was informed the Neurology Consult would happen via TeleHealth consult by way of interactive audio and video telecommunications and consented to receiving care in this manner.

## 2018-02-23 NOTE — Evaluation (Addendum)
Physical Therapy Evaluation Patient Details Name: Patrick Turner MRN: [id number] DOB: 10/21/1945 Today's Date: 02/23/2018   History of Present Illness  Patient is a 72 year old male admitted for a TIA s/p c/o AMS.  PMH includes DM, Htn, neuropathy and depression.  Clinical Impression  Pt is a 72 year old male who lives in a two story home with his wife.  He is independent at baseline without use of AD. He is A&O x4 but presents with delay in answering questions on occasion. Pt is in bed upon PT arrival and able to perform bed mobility independently as well as sit at EOB with good balance.  Coordination testing revealed no deficits and pt presented with mildly decreased strength of bilateral UE and LE.  He reports chronic numbness due to neuropathy in bilateral feet and that he checks his feet consistently for cuts.  Pt is able to perform a STS without use of AD and reports mild dizziness for approximately 10 sec after standing.  Pt presents as hypertensive when standing.  He is able to complete 200 ft of ambulation without AD and presents with gait deviations indicative of fall risk.  Pt is also unsteady on feet during higher level gait activity and static balance activity.  PT discussed benefit of use of SPC for fall prevention and educated pt concerning strengthening of hip musculature to address gait deviations.  He will continue to benefit from skilled PT with focus on strength, balance and fall prevention and proper use of AD.    Follow Up Recommendations Outpatient PT    Equipment Recommendations  None recommended by PT    Recommendations for Other Services       Precautions / Restrictions Precautions Precautions: Fall Restrictions Weight Bearing Restrictions: No      Mobility  Bed Mobility Overal bed mobility: Independent                Transfers Overall transfer level: Needs assistance   Transfers: Sit to/from Stand Sit to Stand: Supervision         General  transfer comment: Able to stand without physical assist.  Reports some lightheadedness when first standing and that this is normal for him.  He recovers in 10 seconds or less.  Ambulation/Gait Ambulation/Gait assistance: Min guard Gait Distance (Feet): 200 Feet       Gait velocity interpretation: 1.31 - 2.62 ft/sec, indicative of limited community ambulator General Gait Details: Low foot clearance, crossover gait every 3-4 steps with mild lateral deviation, able to correct.  Pt does not use UE support but PT discussed use of SPC for support and educated pt concerning benefit of PT for strengthening of gluteus medius related to gait.  Stairs            Wheelchair Mobility    Modified Rankin (Stroke Patients Only)       Balance Overall balance assessment: Needs assistance Sitting-balance support: Feet supported Sitting balance-Leahy Scale: Good     Standing balance support: No upper extremity supported Standing balance-Leahy Scale: Fair               High level balance activites: Direction changes;Backward walking High Level Balance Comments: Slows gait and experiences lateral deviations with higher level gait activity.  Able to bend to lift object from floor with unilateral UE support on counter.  Not comfortable reaching overhead to open cabinet due to fear of posterior LOB.             Pertinent  Vitals/Pain Pain Assessment: 0-10 Pain Score: 5  Pain Location: L knee Pain Intervention(s): Monitored during session    Home Living Family/patient expects to be discharged to:: Private residence Living Arrangements: Spouse/significant other Available Help at Discharge: Family;Available PRN/intermittently Type of Home: House Home Access: Stairs to enter Entrance Stairs-Rails: Can reach both Entrance Stairs-Number of Steps: 3 Home Layout: Two level Home Equipment: [place] - 2 wheels;Cane - single point Additional Comments: Has not used RW since recovering from R  TKA.    Prior Function Level of Independence: Independent         Comments: Works full time night shift at [employer]     Hand Dominance   Dominant Hand: Right    Extremity/Trunk Assessment   Upper Extremity Assessment Upper Extremity Assessment: Generalized weakness(Grossly 4-/5 bilaterally.)    Lower Extremity Assessment Lower Extremity Assessment: Overall WFL for tasks assessed(Grossly 4/5 bilaterally.  Reports that he has chronic numness in his feet due to neuropathy.)    Cervical / Trunk Assessment Cervical / Trunk Assessment: Normal  Communication   Communication: No difficulties  Cognition Arousal/Alertness: Awake/alert Behavior During Therapy: WFL for tasks assessed/performed Overall Cognitive Status: Within Functional Limits for tasks assessed                                 General Comments: A&O x4 and follows commands consistently.  Slow to respond to questions at times.      General Comments      Exercises Other Exercises Other Exercises: Clamshells L LE in sidelying x10, sidelying hip abduction with straight leg x8 with manual and VC's for body mechanics.  No increased pain. Other Exercises: Education concerning benefit of use of SPC for fall prevention and working with a therapist one on one to become aquainted with appropriate HEP. Other Exercises: HEP issued including clamshell, sidelying hip abduction and supine resisted clamshell exercises with option for video access for education.  Addended 02/23/2018, 1254   Assessment/Plan    PT Assessment Patient needs continued PT services  PT Problem List Decreased strength;Decreased activity tolerance;Decreased balance;Decreased knowledge of use of DME       PT Treatment Interventions DME instruction;Therapeutic activities;Gait training;Therapeutic exercise;Patient/family education;Stair training;Balance training;Functional mobility training;Neuromuscular re-education    PT Goals (Current  goals can be found in the Care Plan section)  Acute Rehab PT Goals Patient Stated Goal: To return to work at [employer] as soon as possible. PT Goal Formulation: With patient Time For Goal Achievement: 03/09/18 Potential to Achieve Goals: Good    Frequency Min 2X/week   Barriers to discharge        Co-evaluation               AM-PAC PT "6 Clicks" Daily Activity  Outcome Measure Difficulty turning over in bed (including adjusting bedclothes, sheets and blankets)?: None Difficulty moving from lying on back to sitting on the side of the bed? : None Difficulty sitting down on and standing up from a chair with arms (e.g., wheelchair, bedside commode, etc,.)?: A Little Help needed moving to and from a bed to chair (including a wheelchair)?: A Little Help needed walking in hospital room?: A Little Help needed climbing 3-5 steps with a railing? : A Little 6 Click Score: 20    End of Session Equipment Utilized During Treatment: Gait belt Activity Tolerance: Patient tolerated treatment well Patient left: in bed;with nursing/sitter in room;with call bell/phone within reach Nurse  Communication: Mobility status PT Visit Diagnosis: Unsteadiness on feet (R26.81);Muscle weakness (generalized) (M62.81)    Time: 1000-1035 PT Time Calculation (min) (ACUTE ONLY): 35 min   Charges:   PT Evaluation $PT Eval Low Complexity: 1 Low PT Treatments $Therapeutic Exercise: 8-22 mins   PT G Codes:   PT G-Codes **NOT FOR INPATIENT CLASS** Functional Assessment Tool Used: AM-PAC 6 Clicks Basic Mobility    Roxanne Gates, PT, DPT   Roxanne Gates 02/23/2018, 10:57 AM, Addendum 02/23/2018, 3779

## 2018-02-23 NOTE — H&P (Signed)
[facility] [place] NAME: Patrick Turner    MR#:  [id number]  DATE OF BIRTH:  [birth year]  DATE OF ADMISSION:  02/23/2018  PRIMARY CARE PHYSICIAN: Kirk Ruths, MD   REQUESTING/REFERRING PHYSICIAN: Owens Shark, MD  CHIEF COMPLAINT:   Chief Complaint  Patient presents with  . Altered Mental Status    HISTORY OF PRESENT ILLNESS:  Patrick Turner  is a 72 y.o. male who presents with episodes of intermittent slurred speech with confusion.  He had this episode at work and was brought to the ED for evaluation.  Then in the ED he had another episode witnessed by staff.  Telemetry neurology saw the patient and was concern for TIA/possible stroke.  They recommended admission for further work-up.  PAST MEDICAL HISTORY:   Past Medical History:  Diagnosis Date  . Altered mental status   . Anxiety   . Arthritis   . CRD (chronic renal disease)   . Depression   . Diabetes mellitus without complication ([place])   . GERD (gastroesophageal reflux disease)   . Hypercholesteremia   . Hypertension   . Neuropathy   . Sleep apnea   . Sleep terror    per patient this year per patient      PAST SURGICAL HISTORY:   Past Surgical History:  Procedure Laterality Date  . APPENDECTOMY    . CIRCUMCISION    . 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 KNEE ARTHROSCOPY Right   . TONSILLECTOMY       SOCIAL HISTORY:   Social History   Tobacco Use  . Smoking status: Never Smoker  . Smokeless tobacco: Never Used  Substance Use Topics  . Alcohol use: Yes    Comment: rare     FAMILY HISTORY:   Family History  Problem Relation Age of Onset  . Diabetes Mother   . Heart disease Mother   . Cancer Father   . COPD Father      DRUG ALLERGIES:   Allergies  Allergen Reactions  . Penicillins  Anaphylaxis    Has patient had a PCN reaction causing immediate rash, facial/tongue/throat swelling, SOB or lightheadedness with hypotension: Yes Has patient had a PCN reaction causing severe rash involving mucus membranes or skin necrosis: No Has patient had a PCN reaction that required hospitalization Yes Has patient had a PCN reaction occurring within the last 10 years: No If all of the above answers are "NO", then may proceed with Cephalosporin use.  . Tavist [Clemastine] Swelling    MEDICATIONS AT HOME:   Prior to Admission medications   Medication Sig Start Date End Date Taking? Authorizing Provider  aspirin 325 MG tablet Take 1 tablet (325 mg total) by mouth daily. 06/26/17  Yes Wieting, Richard, MD  atorvastatin (LIPITOR) 80 MG tablet Take 80 mg by mouth at bedtime. 12/14/15  Yes [provider]  clonazePAM (KLONOPIN) 1 MG tablet Take 0.5 mg by mouth at bedtime. 12/18/15  Yes [provider]  gabapentin (NEURONTIN) 400 MG capsule Take 400 mg by mouth 3 (three) times daily.   Yes [provider]  LEVEMIR 100 UNIT/ML injection Inject 10-12 Units into the skin at bedtime.    Yes [provider]  lisinopril-hydrochlorothiazide (PRINZIDE,ZESTORETIC) 20-12.5 MG per tablet Take 1 tablet by mouth daily.    Yes [provider]  metFORMIN (GLUCOPHAGE-XR) 500 MG 24 hr tablet Take 1,000 mg by mouth 2 (two) times daily.   Yes [provider]  omeprazole (PRILOSEC) 40 MG capsule Take 40 mg by mouth every morning.   Yes [provider]  PARoxetine (PAXIL) 40 MG tablet Take 40 mg by mouth at bedtime.   Yes [provider]  QUEtiapine (SEROQUEL) 100 MG tablet Take 100 mg by mouth at bedtime. 12/22/15  Yes [provider]  topiramate (TOPAMAX) 100 MG tablet Take 100 mg by mouth 2 (two) times daily.   Yes [provider]  vitamin B-12 (CYANOCOBALAMIN) 1000 MCG tablet Take 1,000 mcg by mouth daily.   Yes [provider]  oxyCODONE (OXY IR/ROXICODONE) 5 MG immediate release tablet Take 1-2 tablets (5-10 mg total) by mouth every 4 (four) hours as needed for severe pain or breakthrough pain. Patient not taking: Reported on 02/23/2018 06/29/16   Watt Climes, PA  traMADol (ULTRAM) 50 MG tablet Take 1-2 tablets (50-100 mg total) by mouth every 4 (four) hours as needed for moderate pain. Patient not taking: Reported on 02/23/2018 06/29/16   Watt Climes, PA    REVIEW OF SYSTEMS:  Review of Systems  Constitutional: Negative for chills, fever, malaise/fatigue and weight loss.  HENT: Negative for ear pain, hearing loss and tinnitus.   Eyes: Negative for blurred vision, double vision, pain and redness.  Respiratory: Negative for cough, hemoptysis and shortness of breath.   Cardiovascular: Negative for chest pain, palpitations, orthopnea and leg swelling.  Gastrointestinal: Negative for abdominal pain, constipation, diarrhea, nausea and vomiting.  Genitourinary: Negative for dysuria, frequency and hematuria.  Musculoskeletal: Negative for back pain, joint pain and neck pain.  Skin:       No acne, rash, or lesions  Neurological: Positive for speech change. Negative for dizziness, tremors, focal weakness and weakness.  Endo/Heme/Allergies: Negative for polydipsia. Does not bruise/bleed easily.  Psychiatric/Behavioral: Negative for depression. The patient is not nervous/anxious and does not have insomnia.      VITAL SIGNS:   Vitals:   02/23/18 0300 02/23/18 0330 02/23/18 0400 02/23/18 0430  BP: 135/61 (!) 151/65 (!) 151/63 118/73  Pulse: 70     Resp: 16 15 16  (!) 22  Temp:   98.6 F (37 C)   TempSrc:      SpO2: (!) 84%     Weight:      Height:       Wt Readings from Last 3 Encounters:  02/23/18 85.3 kg (188 lb)  06/25/17 87 kg (191 lb 12.8 oz)  07/12/16 88.8 kg (195 lb 12.8 oz)    PHYSICAL EXAMINATION:  Physical Exam  Vitals reviewed. Constitutional: He is oriented to person, place, and  time. He appears well-developed and well-nourished. No distress.  HENT:  Head: Normocephalic and atraumatic.  Dry mucous membranes  Eyes: Pupils are equal, round, and reactive to light. Conjunctivae and EOM are normal. No scleral icterus.  Neck: Normal range of motion. Neck supple. No JVD present. No thyromegaly present.  Cardiovascular: Normal rate, regular rhythm and intact distal pulses. Exam reveals no gallop and no friction rub.  No murmur heard. Respiratory: Effort normal and breath sounds normal. No respiratory distress. He has no wheezes. He has no rales.  GI: Soft. Bowel sounds are normal. He exhibits no distension. There is no tenderness.  Musculoskeletal: Normal range of motion. He exhibits no edema.  No arthritis, no gout  Lymphadenopathy:    He has no cervical adenopathy.  Neurological: He is alert and oriented to person, place, and time. No cranial nerve deficit.  Neurologic: Cranial nerves II-XII intact, Sensation intact to light touch/pinprick, 5/5 strength in all extremities, no dysarthria, no aphasia, no dysphagia, memory intact, no pronator drift  Skin: Skin is warm and dry. No rash noted. No erythema.  Psychiatric: He has a normal mood and affect. His behavior is normal. Judgment and thought content normal.    LABORATORY PANEL:   CBC Recent Labs  Lab 02/23/18 0308  WBC 6.4  HGB 11.1*  HCT 33.9*  PLT 258   ------------------------------------------------------------------------------------------------------------------  Chemistries  Recent Labs  Lab 02/23/18 0308  NA 136  K 4.9  CL 104  CO2 22  GLUCOSE 337*  BUN 43*  CREATININE 1.55*  CALCIUM 8.9  AST 28  ALT 16*  ALKPHOS 75  BILITOT 0.5   ------------------------------------------------------------------------------------------------------------------  Cardiac Enzymes Recent Labs  Lab 02/23/18 0308  TROPONINI <0.03    ------------------------------------------------------------------------------------------------------------------  RADIOLOGY:  Ct Head Code Stroke Wo Contrast  Result Date: 02/23/2018 CLINICAL DATA:  Code stroke.  Slurred speech EXAM: CT HEAD WITHOUT CONTRAST TECHNIQUE: Contiguous axial images were obtained from the base of the skull through the vertex without intravenous contrast. COMPARISON:  Head CT 06/25/2017 FINDINGS: Brain: There is no mass, hemorrhage or extra-axial collection. The size and configuration of the ventricles and extra-axial CSF spaces are normal. There is no acute or chronic infarction. The brain parenchyma is normal. Vascular: No abnormal hyperdensity of the major intracranial arteries or dural venous sinuses. No intracranial atherosclerosis. Skull: The visualized skull base, calvarium and extracranial soft tissues are normal. Sinuses/Orbits: No fluid levels or advanced mucosal thickening of the visualized paranasal sinuses. No mastoid or middle ear effusion. The orbits are normal. ASPECTS [facility] Stroke Program Early CT Score) - Ganglionic level infarction (caudate, lentiform nuclei, internal capsule, insula, M1-M3 cortex): 7 - Supraganglionic infarction (M4-M6 cortex): 3 Total score (0-10 with 10 being normal): 10 IMPRESSION: 1. Normal brain. 2. ASPECTS is 10. These results were called by telephone at the time of interpretation on 02/23/2018 at 3:24 am to Dr. Marjean Donna , who verbally acknowledged these results. Electronically Signed   By: Ulyses Jarred M.D.   On: 02/23/2018 03:24    EKG:   Orders placed or performed during the hospital encounter of 02/23/18  . EKG 12-Lead  . EKG 12-Lead  . ED EKG  . ED EKG  . EKG 12-Lead  . EKG 12-Lead  . EKG 12-Lead  . EKG 12-Lead    IMPRESSION AND PLAN:  Principal Problem:   TIA (transient ischemic attack) -patient symptoms have resolved at this time.  Admit per TIA/stroke admission order set with appropriate labs, imaging,  consults. Active Problems:   Diabetes ([place]) -sliding scale insulin with corresponding glucose checks   HTN (hypertension) -continue home meds   HLD (hyperlipidemia) -Home dose antilipid   GERD (gastroesophageal reflux disease) -Home dose PPI  Chart review performed and case discussed with ED provider. Labs, imaging and/or ECG reviewed by provider and discussed with patient/family. Management plans discussed with the patient and/or family.  DVT PROPHYLAXIS: SubQ lovenox  GI PROPHYLAXIS: PPI  ADMISSION STATUS: Observation  CODE STATUS: Full Code Status History    Date Active Date Inactive Code Status Order ID Comments User Context   06/25/2017 0646 06/26/2017 1336 Full Code [id number]  Saundra Shelling, MD Inpatient   07/12/2016 2022 07/13/2016 2311 Full Code [id number]  Demetrios Loll, MD Inpatient   06/27/2016 1656 06/29/2016 2034 Full Code  [id number]  Dereck Leep, MD Inpatient   05/30/2016 1639 06/01/2016 1501 Full Code [id number]  Theodoro Grist, MD Inpatient      TOTAL TIME TAKING CARE OF THIS PATIENT: 40 minutes.   Gur[place]et [place] 02/23/2018, 5:14 AM  [employer]  [phone]  CC: Primary care physician; Kirk Ruths, MD  Note:  This document was prepared using Dragon voice recognition software and may include unintentional dictation errors.

## 2018-02-23 NOTE — ED Triage Notes (Signed)
Patient coming EMS from home for AMS. Patient with abnormal behavior per customer on site. Patient c/o generalized weakness

## 2018-02-23 NOTE — ED Provider Notes (Signed)
[facility] Emergency Department Provider Note   First MD Initiated Contact with Patient 02/23/18 0510     (approximate)  I have reviewed the triage vital signs and the nursing notes.   HISTORY  Chief Complaint Altered Mental Status   HPI Patrick Turner is a 72 y.o. male with below list of chronic medical conditions presents to the emergency department via EMS.  Per EMS patient works at [employer] and a Psychologist, occupational to the store called EMS stating that the patient was not "acting right".  On EMS arrival the patient was noted to have slurred speech and some confusion.  On arrival to the emergency department though symptoms have resolved patient has no complaints at present   Past Medical History:  Diagnosis Date  . Altered mental status   . Anxiety   . Arthritis   . CRD (chronic renal disease)   . Depression   . Diabetes mellitus without complication ([place])   . GERD (gastroesophageal reflux disease)   . Hypercholesteremia   . Hypertension   . Neuropathy   . Sleep apnea   . Sleep terror    per patient this year per patient     Patient Active Problem List   Diagnosis Date Noted  . Diabetes ([place]) 02/23/2018  . HTN (hypertension) 02/23/2018  . HLD (hyperlipidemia) 02/23/2018  . GERD (gastroesophageal reflux disease) 02/23/2018  . TIA (transient ischemic attack) 07/12/2016  . 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 Acute diarrhea 05/30/2016  . Acute encephalopathy 05/30/2016  . Anemia 05/30/2016  . Acute on chronic renal insufficiency 05/30/2016  . Diarrhea 05/30/2016  . Unspecified transient cerebral ischemia 05/29/2012    Past Surgical History:  Procedure Laterality Date  . APPENDECTOMY    . CIRCUMCISION    . 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 KNEE ARTHROSCOPY Right   .  TONSILLECTOMY      Prior to Admission medications   Medication Sig Start Date End Date Taking? Authorizing Provider  aspirin 325 MG tablet Take 1 tablet (325 mg total) by mouth daily. 06/26/17  Yes Wieting, Richard, MD  atorvastatin (LIPITOR) 80 MG tablet Take 80 mg by mouth at bedtime. 12/14/15  Yes [provider]  clonazePAM (KLONOPIN) 1 MG tablet Take 0.5 mg by mouth at bedtime. 12/18/15  Yes [provider]  gabapentin (NEURONTIN) 400 MG capsule Take 400 mg by mouth 3 (three) times daily.   Yes [provider]  LEVEMIR 100 UNIT/ML injection Inject 10-12 Units into the skin at bedtime.    Yes [provider]  lisinopril-hydrochlorothiazide (PRINZIDE,ZESTORETIC) 20-12.5 MG per tablet Take 1 tablet by mouth daily.    Yes [provider]  metFORMIN (GLUCOPHAGE-XR) 500 MG 24 hr tablet Take 1,000 mg by mouth 2 (two) times daily.   Yes [provider]  omeprazole (PRILOSEC) 40 MG capsule Take 40 mg by mouth every morning.   Yes [provider]  PARoxetine (PAXIL) 40 MG tablet Take 40 mg by mouth at bedtime.   Yes [provider]  QUEtiapine (SEROQUEL) 100 MG tablet Take 100 mg by mouth at bedtime. 12/22/15  Yes [provider]  topiramate (TOPAMAX) 100 MG tablet Take 100 mg by mouth 2 (two) times daily.   Yes [provider]  vitamin B-12 (CYANOCOBALAMIN) 1000 MCG tablet Take 1,000 mcg by mouth daily.  Yes [provider]  oxyCODONE (OXY IR/ROXICODONE) 5 MG immediate release tablet Take 1-2 tablets (5-10 mg total) by mouth every 4 (four) hours as needed for severe pain or breakthrough pain. Patient not taking: Reported on 02/23/2018 06/29/16   Watt Climes, PA  traMADol (ULTRAM) 50 MG tablet Take 1-2 tablets (50-100 mg total) by mouth every 4 (four) hours as needed for moderate pain. Patient not taking: Reported on 02/23/2018 06/29/16   Watt Climes, PA    Allergies Penicillins and Tavist  [clemastine]  Family History  Problem Relation Age of Onset  . Diabetes Mother   . Heart disease Mother   . Cancer Father   . COPD Father     Social History Social History   Tobacco Use  . Smoking status: Never Smoker  . Smokeless tobacco: Never Used  Substance Use Topics  . Alcohol use: Yes    Comment: rare  . Drug use: No    Review of Systems Constitutional: No fever/chills Eyes: No visual changes. ENT: No sore throat. Cardiovascular: Denies chest pain. Respiratory: Denies shortness of breath. Gastrointestinal: No abdominal pain.  No nausea, no vomiting.  No diarrhea.  No constipation. Genitourinary: Negative for dysuria. Musculoskeletal: Negative for neck pain.  Negative for back pain. Integumentary: Negative for rash. Neurological: Negative for headaches, focal weakness or numbness.  Positive for slurred speech and confusion which is since resolved.   ____________________________________________   PHYSICAL EXAM:  VITAL SIGNS: ED Triage Vitals  Enc Vitals Group     BP 02/23/18 0258 (!) 143/57     Pulse Rate 02/23/18 0258 72     Resp 02/23/18 0258 18     Temp 02/23/18 0258 97.9 F (36.6 C)     Temp Source 02/23/18 0258 Oral     SpO2 02/23/18 0258 100 %     Weight 02/23/18 0259 85.3 kg (188 lb)     Height 02/23/18 0259 1.626 m (5\' 4" )     Head Circumference --      Peak Flow --      Pain Score 02/23/18 0259 0     Pain Loc --      Pain Edu? --      Excl. in [place]? --     Constitutional: Alert and oriented. Well appearing and in no acute distress. Eyes: Conjunctivae are normal. PERRL. EOMI. Head: Atraumatic. Mouth/Throat: Mucous membranes are moist.  Oropharynx non-erythematous. Neck: No stridor.   Cardiovascular: Normal rate, regular rhythm. Good peripheral circulation. Grossly normal heart sounds. Respiratory: Normal respiratory effort.  No retractions. Lungs CTAB. Gastrointestinal: Soft and nontender. No distention.  Musculoskeletal: No lower  extremity tenderness nor edema. No gross deformities of extremities. Neurologic:  Normal speech and language. No gross focal neurologic deficits are appreciated.  Skin:  Skin is warm, dry and intact. No rash noted. Psychiatric: Mood and affect are normal. Speech and behavior are normal.  ____________________________________________   LABS (all labs ordered are listed, but only abnormal results are displayed)  Labs Reviewed  CBC - Abnormal; Notable for the following components:      Result Value   RBC 4.01 (*)    Hemoglobin 11.1 (*)    HCT 33.9 (*)    RDW 16.8 (*)    All other components within normal limits  COMPREHENSIVE METABOLIC PANEL - Abnormal; Notable for the following components:   Glucose, Bld 337 (*)    BUN 43 (*)    Creatinine, Ser 1.55 (*)    ALT 16 (*)  GFR calc non Af Amer 43 (*)    GFR calc Af Amer 50 (*)    All other components within normal limits  GLUCOSE, CAPILLARY - Abnormal; Notable for the following components:   Glucose-Capillary 283 (*)    All other components within normal limits  GLUCOSE, CAPILLARY - Abnormal; Notable for the following components:   Glucose-Capillary 171 (*)    All other components within normal limits  TROPONIN I  ETHANOL  PROTIME-INR  APTT  URINALYSIS, COMPLETE (UACMP) WITH MICROSCOPIC   ____________________________________________  EKG  ED ECG REPORT I, [place] Bransyn Adami, the attending physician, personally viewed and interpreted this ECG.   Date: 02/23/2018  EKG Time: 2:58 AM  Rate: 71  Rhythm: Normal sinus rhythm  Axis: Normal  Intervals: Normal  ST&T Change: None  ____________________________________________  RADIOLOGY I, [place] N Zyla Dascenzo, personally viewed and evaluated these images (plain radiographs) as part of my medical decision making, as well as reviewing the written report by the radiologist.  ED MD interpretation: No acute findings noted on CT scan of the head  Official radiology report(s): Ct Head  Code Stroke Wo Contrast  Result Date: 02/23/2018 CLINICAL DATA:  Code stroke.  Slurred speech EXAM: CT HEAD WITHOUT CONTRAST TECHNIQUE: Contiguous axial images were obtained from the base of the skull through the vertex without intravenous contrast. COMPARISON:  Head CT 06/25/2017 FINDINGS: Brain: There is no mass, hemorrhage or extra-axial collection. The size and configuration of the ventricles and extra-axial CSF spaces are normal. There is no acute or chronic infarction. The brain parenchyma is normal. Vascular: No abnormal hyperdensity of the major intracranial arteries or dural venous sinuses. No intracranial atherosclerosis. Skull: The visualized skull base, calvarium and extracranial soft tissues are normal. Sinuses/Orbits: No fluid levels or advanced mucosal thickening of the visualized paranasal sinuses. No mastoid or middle ear effusion. The orbits are normal. ASPECTS [facility] Stroke Program Early CT Score) - Ganglionic level infarction (caudate, lentiform nuclei, internal capsule, insula, M1-M3 cortex): 7 - Supraganglionic infarction (M4-M6 cortex): 3 Total score (0-10 with 10 being normal): 10 IMPRESSION: 1. Normal brain. 2. ASPECTS is 10. These results were called by telephone at the time of interpretation on 02/23/2018 at 3:24 am to Dr. Marjean Donna , who verbally acknowledged these results. Electronically Signed   By: Ulyses Jarred M.D.   On: 02/23/2018 03:24      Procedures   ____________________________________________   INITIAL IMPRESSION / ASSESSMENT AND PLAN / ED COURSE  As part of my medical decision making, I reviewed the following data within the electronic MEDICAL RECORD NUMBER   72 year old male presenting with above-stated history and physical exam secondary to slurred speech and confusion which resolved before arrival to the emergency department.  Following my evaluation I was notified by the nursing staff that the patient once again had slurred speech and as such I responded  to the room and noted that the patient did indeed have slurred speech and some confusion.  CT scan of the patient's head revealed no acute intracranial abnormality per the radiologist code stroke was initiated on the patient arrival patient was evaluated by Dr. Lucia Gaskins neurologist who recommended hospital admission for further stroke TIA evaluation however NIH stroke scale at this time is 0 and as such TPA is not indicated.    ____________________________________________  FINAL CLINICAL IMPRESSION(S) / ED DIAGNOSES  Final diagnoses:  TIA (transient ischemic attack)     MEDICATIONS GIVEN DURING THIS VISIT:  Medications  atorvastatin (LIPITOR) tablet 80 mg (has  no administration in time range)  aspirin tablet 325 mg (has no administration in time range)  pantoprazole (PROTONIX) EC tablet 40 mg (has no administration in time range)  PARoxetine (PAXIL) tablet 40 mg (has no administration in time range)  QUEtiapine (SEROQUEL) tablet 100 mg (has no administration in time range)  topiramate (TOPAMAX) tablet 100 mg (has no administration in time range)   stroke: mapping our early stages of recovery book (has no administration in time range)  acetaminophen (TYLENOL) tablet 650 mg (has no administration in time range)    Or  acetaminophen (TYLENOL) solution 650 mg (has no administration in time range)    Or  acetaminophen (TYLENOL) suppository 650 mg (has no administration in time range)  enoxaparin (LOVENOX) injection 40 mg (has no administration in time range)  insulin aspart (novoLOG) injection 0-9 Units (has no administration in time range)  insulin aspart (novoLOG) injection 0-5 Units (has no administration in time range)     ED Discharge Orders    None       Note:  This document was prepared using Dragon voice recognition software and may include unintentional dictation errors.    Gregor Hams, MD 02/23/18 [phone]

## 2018-02-23 NOTE — ED Notes (Signed)
EKG exported at this time.
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
## 2018-02-23 NOTE — Care Management Note (Signed)
Case Management Note  Patient Details  Name: Patrick Turner MRN: [id number] Date of Birth: 10-16-1945  Subjective/Objective:    Admitted to [facility] under observation status with the diagnosis of TIA. Lives with wife, [name] [phone]). Last seen Dr. Ouida Sills 02/20/18. Prescriptions are filled at [facility] on [address].  [place] in the past.    No skilled facility. No home oxygen. Youth walker in the home. Takes care of all basic activities of daily living himself. No falls. Fair appetite. Family/friend will transport            Action/Plan: will continue to follow for discharge plans, if needed.   Expected Discharge Date:                  Expected Discharge Plan:     In-House Referral:     Discharge planning Services     Post Acute Care Choice:    Choice offered to:     DME Arranged:    DME Agency:     HH Arranged:    HH Agency:     Status of Service:     If discussed at [employer], dates discussed:    Additional Comments:  Shelbie Ammons, RN MSN CCM Care Management [phone] 02/23/2018, 8:41 AM
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
## 2018-02-23 NOTE — Care Management Obs Status (Signed)
[place] NOTIFICATION   Patient Details  Name: KACYN SOUDER MRN: [id number] Date of Birth: 1946/01/08   Medicare Observation Status Notification Given:  Yes    Shelbie Ammons, RN 02/23/2018, 8:35 AM

## 2018-02-23 NOTE — Consult Note (Addendum)
Reason for Consult:Confusion and slurred speech Referring Physician: Verdell Carmine  CC: Confusion and slurred speech  HPI: Patrick Turner is an 72 y.o. male with a history of memory problems and unexplained episodic anxiety who presents with episodes of intermittent slurred speech and confusion.  He had an episode at work and was brought to the ED for evaluation.  Then in the ED he had another episode witnessed by staff.  Telemetry neurology saw the patient and was concern for TIA/possible stroke.  Patient returned to baseline after each event.     Past Medical History:  Diagnosis Date  . Altered mental status   . Anxiety   . Arthritis   . CRD (chronic renal disease)   . Depression   . Diabetes mellitus without complication ([place])   . GERD (gastroesophageal reflux disease)   . Hypercholesteremia   . Hypertension   . Neuropathy   . Sleep apnea   . Sleep terror    per patient this year per patient     Past Surgical History:  Procedure Laterality Date  . APPENDECTOMY    . CIRCUMCISION    . 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 KNEE ARTHROSCOPY Right   . TONSILLECTOMY      Family History  Problem Relation Age of Onset  . Diabetes Mother   . Heart disease Mother   . Cancer Father   . COPD Father     Social History:  reports that he has never smoked. He has never used smokeless tobacco. He reports that he drinks alcohol. He reports that he does not use drugs.  Allergies  Allergen Reactions  . Penicillins Anaphylaxis    Has patient had a PCN reaction causing immediate rash, facial/tongue/throat swelling, SOB or lightheadedness with hypotension: Yes Has patient had a PCN reaction causing severe rash involving mucus membranes or skin necrosis: No Has patient had a PCN reaction that required hospitalization Yes Has patient had a PCN  reaction occurring within the last 10 years: No If all of the above answers are "NO", then may proceed with Cephalosporin use.  . Tavist [Clemastine] Swelling    Medications:  I have reviewed the patient's current medications. Prior to Admission:  Medications Prior to Admission  Medication Sig Dispense Refill Last Dose  . aspirin 325 MG tablet Take 1 tablet (325 mg total) by mouth daily.   unknown at unknown  . atorvastatin (LIPITOR) 80 MG tablet Take 80 mg by mouth at bedtime.   unknown at unknown  . clonazePAM (KLONOPIN) 1 MG tablet Take 0.5 mg by mouth at bedtime.  2 unknown at unknown  . gabapentin (NEURONTIN) 400 MG capsule Take 400 mg by mouth 3 (three) times daily.   unknown at unknown  . LEVEMIR 100 UNIT/ML injection Inject 10-12 Units into the skin at bedtime.   3 unknown at unknown  . lisinopril-hydrochlorothiazide (PRINZIDE,ZESTORETIC) 20-12.5 MG per tablet Take 1 tablet by mouth daily.    unknown at unknown  . metFORMIN (GLUCOPHAGE-XR) 500 MG 24 hr tablet Take 1,000 mg by mouth 2 (two) times daily.   unknown at unknown  . omeprazole (PRILOSEC) 40 MG capsule Take 40 mg by mouth every morning.   unknown at unknown  . PARoxetine (PAXIL) 40 MG tablet Take 40 mg by mouth at bedtime.   unknown at unknown  . QUEtiapine (SEROQUEL) 100 MG tablet  Take 100 mg by mouth at bedtime.   unknown at unknown  . topiramate (TOPAMAX) 100 MG tablet Take 100 mg by mouth 2 (two) times daily.   unknown at unknown  . vitamin B-12 (CYANOCOBALAMIN) 1000 MCG tablet Take 1,000 mcg by mouth daily.   unknown at unknown  . oxyCODONE (OXY IR/ROXICODONE) 5 MG immediate release tablet Take 1-2 tablets (5-10 mg total) by mouth every 4 (four) hours as needed for severe pain or breakthrough pain. (Patient not taking: Reported on 02/23/2018) 60 tablet 0 Not Taking at Unknown time  . traMADol (ULTRAM) 50 MG tablet Take 1-2 tablets (50-100 mg total) by mouth every 4 (four) hours as needed for moderate pain. (Patient not  taking: Reported on 02/23/2018) 60 tablet 1 Not Taking at Unknown time   Scheduled: .  stroke: mapping our early stages of recovery book   Does not apply Once  . aspirin EC      . aspirin  325 mg Oral Daily  . atorvastatin  80 mg Oral QHS  . enoxaparin (LOVENOX) injection  40 mg Subcutaneous Q24H  . insulin aspart  0-5 Units Subcutaneous QHS  . insulin aspart  0-9 Units Subcutaneous TID WC  . pantoprazole  40 mg Oral Daily  . PARoxetine  40 mg Oral QHS  . QUEtiapine  100 mg Oral QHS  . topiramate  100 mg Oral BID    ROS: History obtained from the patient  General ROS: negative for - chills, fatigue, fever, night sweats, weight gain or weight loss Psychological ROS: as noted in HPI Ophthalmic ROS: negative for - blurry vision, double vision, eye pain or loss of vision ENT ROS: negative for - epistaxis, nasal discharge, oral lesions, sore throat, tinnitus or vertigo Allergy and Immunology ROS: negative for - hives or itchy/watery eyes Hematological and Lymphatic ROS: negative for - bleeding problems, bruising or swollen lymph nodes Endocrine ROS: negative for - galactorrhea, hair pattern changes, polydipsia/polyuria or temperature intolerance Respiratory ROS: negative for - cough, hemoptysis, shortness of breath or wheezing Cardiovascular ROS: negative for - chest pain, dyspnea on exertion, edema or irregular heartbeat Gastrointestinal ROS: negative for - abdominal pain, diarrhea, hematemesis, nausea/vomiting or stool incontinence Genito-Urinary ROS: negative for - dysuria, hematuria, incontinence or urinary frequency/urgency Musculoskeletal ROS: negative for - joint swelling or muscular weakness Neurological ROS: as noted in HPI Dermatological ROS: negative for rash and skin lesion changes  Physical Examination: Blood pressure (!) 163/70, pulse 63, temperature (!) 97.4 F (36.3 C), temperature source Oral, resp. rate 16, height 5\' 4"  (1.626 m), weight 85.3 kg (188 lb), SpO2 100  %.  HEENT-  Normocephalic, no lesions, without obvious abnormality.  Normal external eye and conjunctiva.  Normal TM's bilaterally.  Normal auditory canals and external ears. Normal external nose, mucus membranes and septum.  Normal pharynx. Cardiovascular- S1, S2 normal, pulses palpable throughout   Lungs- chest clear, no wheezing, rales, normal symmetric air entry Abdomen- soft, non-tender; bowel sounds normal; no masses,  no organomegaly Extremities- RLE edema Lymph-no adenopathy palpable Musculoskeletal-no joint tenderness, deformity or swelling Skin-warm and dry, no hyperpigmentation, vitiligo, or suspicious lesions  Neurological Examination   Mental Status: Alert, oriented to day of the week, year and month.  Oriented to place and able to provide history with some memory issues noted.  Speech fluent.  Able to follow 3 step commands without difficulty. Cranial Nerves: II: Discs flat bilaterally; Visual fields grossly normal, pupils equal, round, reactive to light and accommodation III,IV, VI: ptosis not  present, extra-ocular motions intact bilaterally V,VII: smile symmetric, facial light touch sensation normal bilaterally VIII: hearing normal bilaterally IX,X: gag reflex present XI: bilateral shoulder shrug XII: midline tongue extension Motor: Right : Upper extremity   5/5    Left:     Upper extremity   5/5  Lower extremity   5/5     Lower extremity   5/5 Tone and bulk:normal tone throughout; no atrophy noted Sensory: Pinprick and light touch intact throughout, bilaterally Deep Tendon Reflexes: 2+ in the upper extremities and trace in the lower extremities Plantars: Right: mute   Left: mute Cerebellar: Normal finger-to-nose and normal heel-to-shin testing bilaterally    Laboratory Studies:   Basic Metabolic Panel: Recent Labs  Lab 02/23/18 0308  NA 136  K 4.9  CL 104  CO2 22  GLUCOSE 337*  BUN 43*  CREATININE 1.55*  CALCIUM 8.9    Liver Function Tests: Recent  Labs  Lab 02/23/18 0308  AST 28  ALT 16*  ALKPHOS 75  BILITOT 0.5  PROT 7.2  ALBUMIN 4.0   No results for input(s): LIPASE, AMYLASE in the last 168 hours. No results for input(s): AMMONIA in the last 168 hours.  CBC: Recent Labs  Lab 02/23/18 0308  WBC 6.4  HGB 11.1*  HCT 33.9*  MCV 84.7  PLT 258    Cardiac Enzymes: Recent Labs  Lab 02/23/18 0308  TROPONINI <0.03    BNP: Invalid input(s): POCBNP  CBG: Recent Labs  Lab 02/23/18 0302 02/23/18 0751 02/23/18 0950 02/23/18 1249  GLUCAP 283* 171* 142* 169*    Microbiology: Results for orders placed or performed during the hospital encounter of 06/15/16  Urine culture     Status: Abnormal   Collection Time: 06/15/16  1:30 PM  Result Value Ref Range Status   Specimen Description URINE, RANDOM  Final   Special Requests Normal  Final   Culture (A)  Final    <10,000 COLONIES/mL INSIGNIFICANT GROWTH Performed at [facility]    Report Status 06/16/2016 FINAL  Final  Surgical pcr screen     Status: None   Collection Time: 06/15/16  1:30 PM  Result Value Ref Range Status   MRSA, PCR NEGATIVE NEGATIVE Final   Staphylococcus aureus NEGATIVE NEGATIVE Final    Comment:        The Xpert SA Assay (FDA approved for NASAL specimens in patients over 34 years of age), is one component of a comprehensive surveillance program.  Test performance has been validated by [facility] for patients greater than or equal to 28 year old. It is not intended to diagnose infection nor to guide or monitor treatment.     Coagulation Studies: Recent Labs    02/23/18 0310  LABPROT 13.3  INR 1.02    Urinalysis:  Recent Labs  Lab 02/23/18 1030  COLORURINE STRAW*  LABSPEC 1.010  PHURINE 7.0  GLUCOSEU 50*  HGBUR NEGATIVE  BILIRUBINUR NEGATIVE  KETONESUR NEGATIVE  PROTEINUR NEGATIVE  NITRITE NEGATIVE  LEUKOCYTESUR NEGATIVE    Lipid Panel:     Component Value Date/Time   CHOL 120 06/25/2017 0347   TRIG  136 06/25/2017 0347   HDL 50 06/25/2017 0347   CHOLHDL 2.4 06/25/2017 0347   VLDL 27 06/25/2017 0347   LDLCALC 43 06/25/2017 0347    HgbA1C:  Lab Results  Component Value Date   HGBA1C 8.3 (H) 06/26/2017    Urine Drug Screen:      Component Value Date/Time   LABOPIA NEGATIVE 01/14/2012  [place] 01/14/2012 1000   LABBENZ NEGATIVE 01/14/2012 1000   AMPHETMU NEGATIVE 01/14/2012 1000   THCU NEGATIVE 01/14/2012 1000   LABBARB NEGATIVE 01/14/2012 1000    Alcohol Level:  Recent Labs  Lab 02/23/18 0310  ETH <10    Other results: EKG: sinus rhythm at 71 bpm.  Imaging: Mr Brain Wo Contrast  Result Date: 02/23/2018 CLINICAL DATA:  72 year old male with altered mental status, slurred speech and confusion. EXAM: MRI HEAD WITHOUT CONTRAST MRA HEAD WITHOUT CONTRAST TECHNIQUE: Multiplanar, multiecho pulse sequences of the brain and surrounding structures were obtained without intravenous contrast. Angiographic images of the head were obtained using MRA technique without contrast. COMPARISON:  Head CT without contrast 0313 hours today. Carotid Doppler ultrasound 0821 hours today. Brain MRI and intracranial MRA 06/25/2017. FINDINGS: MRI HEAD FINDINGS Brain: Cerebral volume is within normal limits for age. No restricted diffusion to suggest acute infarction. No midline shift, mass effect, evidence of mass lesion, ventriculomegaly, extra-axial collection or acute intracranial hemorrhage. Cervicomedullary junction and pituitary are within normal limits. Stable gray and white matter signal throughout the brain. Possible small chronic lacunar infarct in the right dorsal pons (series 12, image 8), versus perivascular space. Similar mild T2 heterogeneity in the left globus pallidus. Otherwise normal gray and white matter signal, with no cortical encephalomalacia or chronic cerebral blood products. Vascular: Major intracranial vascular flow voids are stable. Skull and upper cervical  spine: Normal visible cervical spine. Normal bone marrow signal. Sinuses/Orbits: Stable and negative. Other: Visible internal auditory structures appear normal. Mastoid air cells remain clear. Scalp and face soft tissues appear negative. MRA HEAD FINDINGS Antegrade flow in the posterior circulation with codominant distal vertebral arteries. The left PICA origin remains patent. The right PICA is partially visible with preserved flow signal (series 7, image 6). Patent vertebrobasilar junction and basilar artery without stenosis. Patent SCA origins. Normal PCA origins. Posterior communicating arteries are diminutive or absent. Bilateral PCA branches are stable and within normal limits. Antegrade flow in both ICA siphons. No siphon stenosis. Normal ophthalmic artery origins. Patent carotid termini. Normal MCA and ACA origins. The anterior communicating artery, visible bilateral ACA, and visible bilateral MCA branches are stable and within normal limits. IMPRESSION: 1.  No acute intracranial abnormality. 2. Stable noncontrast MRI appearance of the brain, normal for age aside from possible mild chronic small vessel disease in the left deep gray matter and dorsal right pons. 3. Stable and negative intracranial MRA. Electronically Signed   By: Genevie Ann M.D.   On: 02/23/2018 12:19   US Carotid Bilateral (at [facility] And [facility] Only)  Result Date: 02/23/2018 CLINICAL DATA:  TIA.  Hypertension, hyperlipidemia, diabetes EXAM: BILATERAL CAROTID DUPLEX ULTRASOUND TECHNIQUE: Pearline Cables scale imaging, color Doppler and duplex ultrasound was performed of bilateral carotid and vertebral arteries in the neck. COMPARISON:  06/25/2017 TECHNIQUE: Quantification of carotid stenosis is based on velocity parameters that correlate the residual internal carotid diameter with NASCET-based stenosis levels, using the diameter of the distal internal carotid lumen as the denominator for stenosis measurement. The following velocity measurements were obtained:  PEAK SYSTOLIC/END DIASTOLIC RIGHT ICA:                     92/26cm/sec CCA:                     50/93OI/ZTI SYSTOLIC ICA/CCA RATIO:  1.0 ECA:  83cm/sec LEFT ICA:                     98/23cm/sec CCA:                     557/32KG/URK SYSTOLIC ICA/CCA RATIO:  0.8 ECA:                     65cm/sec FINDINGS: RIGHT CAROTID ARTERY: Mild eccentric plaque at the bifurcation. No high-grade stenosis. Normal waveforms and color Doppler signal. RIGHT VERTEBRAL ARTERY:  Normal flow direction and waveform. LEFT CAROTID ARTERY: Eccentric partially calcified plaque at the ICA origin. No high-grade stenosis. Normal waveforms and color Doppler signal. Mild tortuosity. LEFT VERTEBRAL ARTERY: Normal flow direction and waveform. IMPRESSION: 1. Mild bilateral carotid bifurcation plaque resulting in less than 50% diameter stenosis. 2.  Antegrade bilateral vertebral arterial flow. Electronically Signed   By: Lucrezia Europe M.D.   On: 02/23/2018 10:15   Mr Jodene Nam Head/brain YH Cm  Result Date: 02/23/2018 CLINICAL DATA:  72 year old male with altered mental status, slurred speech and confusion. EXAM: MRI HEAD WITHOUT CONTRAST MRA HEAD WITHOUT CONTRAST TECHNIQUE: Multiplanar, multiecho pulse sequences of the brain and surrounding structures were obtained without intravenous contrast. Angiographic images of the head were obtained using MRA technique without contrast. COMPARISON:  Head CT without contrast 0313 hours today. Carotid Doppler ultrasound 0821 hours today. Brain MRI and intracranial MRA 06/25/2017. FINDINGS: MRI HEAD FINDINGS Brain: Cerebral volume is within normal limits for age. No restricted diffusion to suggest acute infarction. No midline shift, mass effect, evidence of mass lesion, ventriculomegaly, extra-axial collection or acute intracranial hemorrhage. Cervicomedullary junction and pituitary are within normal limits. Stable gray and white matter signal throughout the brain. Possible small chronic lacunar  infarct in the right dorsal pons (series 12, image 8), versus perivascular space. Similar mild T2 heterogeneity in the left globus pallidus. Otherwise normal gray and white matter signal, with no cortical encephalomalacia or chronic cerebral blood products. Vascular: Major intracranial vascular flow voids are stable. Skull and upper cervical spine: Normal visible cervical spine. Normal bone marrow signal. Sinuses/Orbits: Stable and negative. Other: Visible internal auditory structures appear normal. Mastoid air cells remain clear. Scalp and face soft tissues appear negative. MRA HEAD FINDINGS Antegrade flow in the posterior circulation with codominant distal vertebral arteries. The left PICA origin remains patent. The right PICA is partially visible with preserved flow signal (series 7, image 6). Patent vertebrobasilar junction and basilar artery without stenosis. Patent SCA origins. Normal PCA origins. Posterior communicating arteries are diminutive or absent. Bilateral PCA branches are stable and within normal limits. Antegrade flow in both ICA siphons. No siphon stenosis. Normal ophthalmic artery origins. Patent carotid termini. Normal MCA and ACA origins. The anterior communicating artery, visible bilateral ACA, and visible bilateral MCA branches are stable and within normal limits. IMPRESSION: 1.  No acute intracranial abnormality. 2. Stable noncontrast MRI appearance of the brain, normal for age aside from possible mild chronic small vessel disease in the left deep gray matter and dorsal right pons. 3. Stable and negative intracranial MRA. Electronically Signed   By: Genevie Ann M.D.   On: 02/23/2018 12:19   Ct Head Code Stroke Wo Contrast  Result Date: 02/23/2018 CLINICAL DATA:  Code stroke.  Slurred speech EXAM: CT HEAD WITHOUT CONTRAST TECHNIQUE: Contiguous axial images were obtained from the base of the skull through the vertex without intravenous contrast. COMPARISON:  Head CT 06/25/2017 FINDINGS: Brain:  There is  no mass, hemorrhage or extra-axial collection. The size and configuration of the ventricles and extra-axial CSF spaces are normal. There is no acute or chronic infarction. The brain parenchyma is normal. Vascular: No abnormal hyperdensity of the major intracranial arteries or dural venous sinuses. No intracranial atherosclerosis. Skull: The visualized skull base, calvarium and extracranial soft tissues are normal. Sinuses/Orbits: No fluid levels or advanced mucosal thickening of the visualized paranasal sinuses. No mastoid or middle ear effusion. The orbits are normal. ASPECTS [facility] Stroke Program Early CT Score) - Ganglionic level infarction (caudate, lentiform nuclei, internal capsule, insula, M1-M3 cortex): 7 - Supraganglionic infarction (M4-M6 cortex): 3 Total score (0-10 with 10 being normal): 10 IMPRESSION: 1. Normal brain. 2. ASPECTS is 10. These results were called by telephone at the time of interpretation on 02/23/2018 at 3:24 am to Dr. Marjean Donna , who verbally acknowledged these results. Electronically Signed   By: Ulyses Jarred M.D.   On: 02/23/2018 03:24     Assessment/Plan: 72 year old male presenting with episodes of confusion and slurred speech.  Initial concern was for TIA.  MRI of the brain reviewed and shows no acute changes.  MRA unremarkable.  Carotid dopplers show no evidence of hemodynamically significant stenosis.  Echocardiogram shows no cardiac source of emboli with an EF of 60-65%.  A1c and LDL are pending.  Blood sugar elevated on presentation.  Patient with a psych history as well.  Unclear if these episodes may be related to his psych history.    Recommendations: 1.  Patient with multiple vascular risk factors.  Would continue ASA at 81mg  and start Plavix at 75mg  daily to be continued for one month before returning to monotherapy 2.  Would address lipids and A1c as necessary based on outstanding labs with target LDL<70 and target A1c<7.0. 3.  EEG.  May be  performed on an outpatient basis if unable to be performed today.   4.  Patient to continue follow up with psych and neurology as an outpatient.      Alexis Goodell, MD Neurology [phone] 02/23/2018, 1:09 PM

## 2018-02-23 NOTE — Progress Notes (Signed)
CODE STROKE- PHARMACY COMMUNICATION   Time CODE STROKE called/page received:06/14 0345  Time response to CODE STROKE was made (in person or via phone): 06/14 0350  Time Stroke Kit retrieved from [place] (only if needed):  Name of Provider/Nurse contacted:: ED RN call attempted. No answer on transfer from ED secretary. Will await further communication from ED.  Past Medical History:  Diagnosis Date  . Altered mental status   . Anxiety   . Arthritis   . CRD (chronic renal disease)   . Depression   . Diabetes mellitus without complication ([place])   . GERD (gastroesophageal reflux disease)   . Hypercholesteremia   . Hypertension   . Neuropathy   . Sleep apnea   . Sleep terror    per patient this year per patient    Prior to Admission medications   Medication Sig Start Date End Date Taking? Authorizing Provider  aspirin 325 MG tablet Take 1 tablet (325 mg total) by mouth daily. 06/26/17  Yes Wieting, Richard, MD  atorvastatin (LIPITOR) 80 MG tablet Take 80 mg by mouth at bedtime. 12/14/15  Yes [provider]  clonazePAM (KLONOPIN) 1 MG tablet Take 0.5 mg by mouth at bedtime. 12/18/15  Yes [provider]  gabapentin (NEURONTIN) 400 MG capsule Take 400 mg by mouth 3 (three) times daily.   Yes [provider]  LEVEMIR 100 UNIT/ML injection Inject 10-12 Units into the skin at bedtime.    Yes [provider]  lisinopril-hydrochlorothiazide (PRINZIDE,ZESTORETIC) 20-12.5 MG per tablet Take 1 tablet by mouth daily.    Yes [provider]  metFORMIN (GLUCOPHAGE-XR) 500 MG 24 hr tablet Take 1,000 mg by mouth 2 (two) times daily.   Yes [provider]  omeprazole (PRILOSEC) 40 MG capsule Take 40 mg by mouth every morning.   Yes [provider]  PARoxetine (PAXIL) 40 MG tablet Take 40 mg by mouth at bedtime.   Yes [provider]  QUEtiapine (SEROQUEL) 100 MG tablet Take 100 mg by mouth at bedtime. 12/22/15  Yes [provider]  topiramate (TOPAMAX) 100 MG tablet Take 100 mg by mouth 2 (two) times daily.   Yes [provider]  vitamin B-12 (CYANOCOBALAMIN) 1000 MCG tablet Take 1,000 mcg by mouth daily.   Yes [provider]  oxyCODONE (OXY IR/ROXICODONE) 5 MG immediate release tablet Take 1-2 tablets (5-10 mg total) by mouth every 4 (four) hours as needed for severe pain or breakthrough pain. Patient not taking: Reported on 02/23/2018 06/29/16   Watt Climes, PA  traMADol (ULTRAM) 50 MG tablet Take 1-2 tablets (50-100 mg total) by mouth every 4 (four) hours as needed for moderate pain. Patient not taking: Reported on 02/23/2018 06/29/16   Watt Climes, PA  gabapentin (NEURONTIN) 800 MG tablet Take 800 mg by mouth 2 (two) times daily.   02/23/18  [provider]  insulin aspart (NOVOLOG) 100 UNIT/ML injection Inject 5 Units into the skin 2 (two) times daily before a meal. Morning and dinner  02/23/18  [provider]  metFORMIN (GLUCOPHAGE) 500 MG tablet Take 1,000 mg by mouth 2 (two) times daily with a meal.  02/23/18  [provider]    Patrick Turner ,PharmD Clinical Pharmacist  02/23/2018  3:51 AM

## 2018-02-23 NOTE — Progress Notes (Signed)
eeg completed ° °

## 2018-02-23 NOTE — ED Notes (Signed)
Patient's CBG for EMS 337

## 2018-02-23 NOTE — Progress Notes (Signed)
SLP Cancellation Note  Patient Details Name: Patrick Turner MRN: [id number] DOB: [birth year]   Cancelled treatment:       Reason Eval/Treat Not Completed: SLP screened, no needs identified, will sign off(chart reviewed; consulted NSG and met w/ pt). Per chart review and pt report, pt takes medications "per my Psychiatrist" for anxiety and "my memory" but stated now he "does not have slurred speech". Pt conversed at conversational level w/out deficits noted. Pt denied any difficulty swallowing and is currently on a regular diet; tolerates swallowing pills w/ water per NSG.  No further skilled ST services indicated as pt appears at his baseline. Pt agreed. NSG to reconsult if any change in status.    Orinda Kenner, MS, CCC-SLP Watson,Katherine 02/23/2018, 5:06 PM

## 2018-02-23 NOTE — Progress Notes (Signed)
Patient discharged home with spouse, IV removed. Patient verbalized understanding of education, patient with no complaints.

## 2018-02-25 NOTE — Discharge Summary (Signed)
[facility] NAME: Patrick Turner    MR#:  [id number]  DATE OF BIRTH:  Jan 09, 1946  DATE OF ADMISSION:  02/23/2018 ADMITTING PHYSICIAN: Lance Coon, MD  DATE OF DISCHARGE: 02/23/2018  8:00 PM  PRIMARY CARE PHYSICIAN: Kirk Ruths, MD    ADMISSION DIAGNOSIS:  Altered Mental Status  DISCHARGE DIAGNOSIS:  Principal Problem:   TIA (transient ischemic attack) Active Problems:   Diabetes ([place])   HTN (hypertension)   HLD (hyperlipidemia)   GERD (gastroesophageal reflux disease)   SECONDARY DIAGNOSIS:   Past Medical History:  Diagnosis Date  . Altered mental status   . Anxiety   . Arthritis   . CRD (chronic renal disease)   . Depression   . Diabetes mellitus without complication ([place])   . GERD (gastroesophageal reflux disease)   . Hypercholesteremia   . Hypertension   . Neuropathy   . Sleep apnea   . Sleep terror    per patient this year per patient     HOSPITAL COURSE:   72 year old male with past medical history of diabetes, GERD, hypertension, hyperlipidemia, osteoarthritis, sleep apnea, neuropathy who presented to the hospital due to altered mental status.  1.  TIA/CVA- this was the working diagnosis on admission given the patient's altered mental status and slight slurred speech. -Patient underwent an extensive work-up including CT head, MRI of the brain which was negative for acute pathology.  Patient carotid duplex also showed no hemodynamically significant carotid artery stenosis.  Patient's echocardiogram showed normal ejection fraction with no evidence of thrombus. - Patient was seen by neurology and they recommended doing an EEG which was also negative for any acute seizures. - Patient is back to his baseline therefore being discharged home on aspirin, Plavix, atorvastatin.  2.  History of essential hypertension-patient will continue with lisinopril/HCTZ.  3.  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  4. Anxiety - pt. Will cont. Paxil, Klonopin.   5. GERD - cont. Omeprazole.    6. Neuropathy - pt. Will cont. Neurontin.   DISCHARGE CONDITIONS:   Stable.   CONSULTS OBTAINED:  Treatment Team:  Alexis Goodell, MD  DRUG ALLERGIES:   Allergies  Allergen Reactions  . Penicillins Anaphylaxis    Has patient had a PCN reaction causing immediate rash, facial/tongue/throat swelling, SOB or lightheadedness with hypotension: Yes Has patient had a PCN reaction causing severe rash involving mucus membranes or skin necrosis: No Has patient had a PCN reaction that required hospitalization Yes Has patient had a PCN reaction occurring within the last 10 years: No If all of the above answers are "NO", then may proceed with Cephalosporin use.  . Tavist [Clemastine] Swelling    DISCHARGE MEDICATIONS:   Allergies as of 02/23/2018      Reactions   Penicillins Anaphylaxis   Has patient had a PCN reaction causing immediate rash, facial/tongue/throat swelling, SOB or lightheadedness with hypotension: Yes Has patient had a PCN reaction causing severe rash involving mucus membranes or skin necrosis: No Has patient had a PCN reaction that required hospitalization Yes Has patient had a PCN reaction occurring within the last 10 years: No If all of the above answers are "NO", then may proceed with Cephalosporin use.   Tavist [clemastine] Swelling      Medication List    STOP taking these medications   oxyCODONE 5 MG immediate release tablet Commonly known as:  Oxy IR/ROXICODONE   traMADol 50 MG tablet Commonly known  as:  ULTRAM     TAKE these medications   aspirin 81 MG tablet Take 1 tablet (81 mg total) by mouth daily. What changed:    medication strength  how much to take   atorvastatin 80 MG tablet Commonly known as:  LIPITOR Take 80 mg by mouth at bedtime.   clonazePAM 1 MG tablet Commonly known as:  KLONOPIN Take 0.5 mg by mouth at  bedtime.   clopidogrel 75 MG tablet Commonly known as:  PLAVIX Take 1 tablet (75 mg total) by mouth daily.   gabapentin 400 MG capsule Commonly known as:  NEURONTIN Take 400 mg by mouth 3 (three) times daily.   LEVEMIR 100 UNIT/ML injection Generic drug:  insulin detemir Inject 10-12 Units into the skin at bedtime.   lisinopril-hydrochlorothiazide 20-12.5 MG tablet Commonly known as:  PRINZIDE,ZESTORETIC Take 1 tablet by mouth daily.   metFORMIN 500 MG 24 hr tablet Commonly known as:  GLUCOPHAGE-XR Take 1,000 mg by mouth 2 (two) times daily.   omeprazole 40 MG capsule Commonly known as:  PRILOSEC Take 40 mg by mouth every morning.   PARoxetine 40 MG tablet Commonly known as:  PAXIL Take 40 mg by mouth at bedtime.   QUEtiapine 100 MG tablet Commonly known as:  SEROQUEL Take 100 mg by mouth at bedtime.   topiramate 100 MG tablet Commonly known as:  TOPAMAX Take 100 mg by mouth 2 (two) times daily.   vitamin B-12 1000 MCG tablet Commonly known as:  CYANOCOBALAMIN Take 1,000 mcg by mouth daily.         DISCHARGE INSTRUCTIONS:   DIET:  Cardiac diet and Diabetic diet  DISCHARGE CONDITION:  Stable  ACTIVITY:  Activity as tolerated  OXYGEN:  Home Oxygen: No.   Oxygen Delivery: room air  DISCHARGE LOCATION:  home   If you experience worsening of your admission symptoms, develop shortness of breath, life threatening emergency, suicidal or homicidal thoughts you must seek medical attention immediately by calling 911 or calling your MD immediately  if symptoms less severe.  You Must read complete instructions/literature along with all the possible adverse reactions/side effects for all the Medicines you take and that have been prescribed to you. Take any new Medicines after you have completely understood and accpet all the possible adverse reactions/side effects.   Please note  You were cared for by a hospitalist during your hospital stay. If you have any  questions about your discharge medications or the care you received while you were in the hospital after you are discharged, you can call the unit and asked to speak with the hospitalist on call if the hospitalist that took care of you is not available. Once you are discharged, your primary care physician will handle any further medical issues. Please note that NO REFILLS for any discharge medications will be authorized once you are discharged, as it is imperative that you return to your primary care physician (or establish a relationship with a primary care physician if you do not have one) for your aftercare needs so that they can reassess your need for medications and monitor your lab values.     Today   No acute events overnight.  Mental status close to baseline.  Will DC home later today.  VITAL SIGNS:  Blood pressure (!) 132/57, pulse 62, temperature 98.3 F (36.8 C), temperature source Oral, resp. rate 14, height 5\' 4"  (1.626 m), weight 85.3 kg (188 lb), SpO2 100 %.  I/O:  No intake or output  data in the 24 hours ending 02/25/18 1233  PHYSICAL EXAMINATION:  GENERAL:  72 y.o.-year-old patient lying in the bed with no acute distress.  EYES: Pupils equal, round, reactive to light and accommodation. No scleral icterus. Extraocular muscles intact.  HEENT: Head atraumatic, normocephalic. Oropharynx and nasopharynx clear.  NECK:  Supple, no jugular venous distention. No thyroid enlargement, no tenderness.  LUNGS: Normal breath sounds bilaterally, no wheezing, rales,rhonchi. No use of accessory muscles of respiration.  CARDIOVASCULAR: S1, S2 normal. No murmurs, rubs, or gallops.  ABDOMEN: Soft, non-tender, non-distended. Bowel sounds present. No organomegaly or mass.  EXTREMITIES: No pedal edema, cyanosis, or clubbing.  NEUROLOGIC: Cranial nerves II through XII are intact. No focal motor or sensory defecits b/l.  PSYCHIATRIC: The patient is alert and oriented x 3.  SKIN: No obvious rash,  lesion, or ulcer.   DATA REVIEW:   CBC Recent Labs  Lab 02/23/18 0308  WBC 6.4  HGB 11.1*  HCT 33.9*  PLT 258    Chemistries  Recent Labs  Lab 02/23/18 0308  NA 136  K 4.9  CL 104  CO2 22  GLUCOSE 337*  BUN 43*  CREATININE 1.55*  CALCIUM 8.9  AST 28  ALT 16*  ALKPHOS 75  BILITOT 0.5    Cardiac Enzymes Recent Labs  Lab 02/23/18 0308  TROPONINI <0.03    Microbiology Results  Results for orders placed or performed during the hospital encounter of 06/15/16  Urine culture     Status: Abnormal   Collection Time: 06/15/16  1:30 PM  Result Value Ref Range Status   Specimen Description URINE, RANDOM  Final   Special Requests Normal  Final   Culture (A)  Final    <10,000 COLONIES/mL INSIGNIFICANT GROWTH Performed at [facility]    Report Status 06/16/2016 FINAL  Final  Surgical pcr screen     Status: None   Collection Time: 06/15/16  1:30 PM  Result Value Ref Range Status   MRSA, PCR NEGATIVE NEGATIVE Final   Staphylococcus aureus NEGATIVE NEGATIVE Final    Comment:        The Xpert SA Assay (FDA approved for NASAL specimens in patients over 87 years of age), is one component of a comprehensive surveillance program.  Test performance has been validated by [facility] for patients greater than or equal to 4 year old. It is not intended to diagnose infection nor to guide or monitor treatment.     RADIOLOGY:  No results found.    Management plans discussed with the patient, family and they are in agreement.  CODE STATUS:  Code Status History    Date Active Date Inactive Code Status Order ID Comments User Context   02/23/2018 0623 02/23/2018 2340 Full Code [id number]  Lance Coon, MD Inpatient   TOTAL TIME TAKING CARE OF THIS PATIENT: 40 minutes.    Henreitta Leber M.D on 02/25/2018 at 12:33 PM  Between 7am to 6pm - Pager - [phone]  After 6pm go to [email or web address] - [employer] [facility]   Office  [phone]  CC: Primary care physician; Kirk Ruths, MD

## 2018-02-26 DIAGNOSIS — N183 Chronic kidney disease, stage 3 (moderate): Secondary | ICD-10-CM | POA: Diagnosis not present

## 2018-02-26 DIAGNOSIS — E1122 Type 2 diabetes mellitus with diabetic chronic kidney disease: Secondary | ICD-10-CM | POA: Diagnosis not present

## 2018-02-26 DIAGNOSIS — G9340 Encephalopathy, unspecified: Secondary | ICD-10-CM | POA: Diagnosis not present

## 2018-03-01 DIAGNOSIS — E1142 Type 2 diabetes mellitus with diabetic polyneuropathy: Secondary | ICD-10-CM | POA: Diagnosis not present

## 2018-03-01 DIAGNOSIS — E1165 Type 2 diabetes mellitus with hyperglycemia: Secondary | ICD-10-CM | POA: Diagnosis not present

## 2018-03-01 DIAGNOSIS — Z794 Long term (current) use of insulin: Secondary | ICD-10-CM | POA: Diagnosis not present

## 2018-03-01 DIAGNOSIS — N183 Chronic kidney disease, stage 3 (moderate): Secondary | ICD-10-CM | POA: Diagnosis not present

## 2018-03-01 DIAGNOSIS — E1122 Type 2 diabetes mellitus with diabetic chronic kidney disease: Secondary | ICD-10-CM | POA: Diagnosis not present

## 2018-03-09 DIAGNOSIS — E119 Type 2 diabetes mellitus without complications: Secondary | ICD-10-CM | POA: Diagnosis not present

## 2018-03-09 DIAGNOSIS — E7800 Pure hypercholesterolemia, unspecified: Secondary | ICD-10-CM | POA: Diagnosis not present

## 2018-03-09 DIAGNOSIS — H524 Presbyopia: Secondary | ICD-10-CM | POA: Diagnosis not present

## 2018-03-09 DIAGNOSIS — Z0100 Encounter for examination of eyes and vision without abnormal findings: Secondary | ICD-10-CM | POA: Diagnosis not present

## 2018-03-09 DIAGNOSIS — H2510 Age-related nuclear cataract, unspecified eye: Secondary | ICD-10-CM | POA: Diagnosis not present

## 2018-03-28 DIAGNOSIS — F333 Major depressive disorder, recurrent, severe with psychotic symptoms: Secondary | ICD-10-CM | POA: Diagnosis not present

## 2018-04-17 DIAGNOSIS — K529 Noninfective gastroenteritis and colitis, unspecified: Secondary | ICD-10-CM | POA: Diagnosis not present

## 2018-04-17 DIAGNOSIS — R441 Visual hallucinations: Secondary | ICD-10-CM | POA: Diagnosis not present

## 2018-04-17 DIAGNOSIS — Z1211 Encounter for screening for malignant neoplasm of colon: Secondary | ICD-10-CM | POA: Diagnosis not present

## 2018-04-17 DIAGNOSIS — R3915 Urgency of urination: Secondary | ICD-10-CM | POA: Diagnosis not present

## 2018-04-17 DIAGNOSIS — N318 Other neuromuscular dysfunction of bladder: Secondary | ICD-10-CM | POA: Diagnosis not present

## 2018-04-19 ENCOUNTER — Other Ambulatory Visit
Admission: RE | Admit: 2018-04-19 | Discharge: 2018-04-19 | Disposition: A | Discharge status: Home / Self Care | Payer: Medicare HMO | Source: Ambulatory Visit | Attending: Gastroenterology | Admitting: Gastroenterology

## 2018-04-19 DIAGNOSIS — K529 Noninfective gastroenteritis and colitis, unspecified: Secondary | ICD-10-CM | POA: Insufficient documentation

## 2018-04-19 LAB — GASTROINTESTINAL PANEL BY PCR, STOOL (REPLACES STOOL CULTURE)

## 2018-04-19 LAB — C DIFFICILE QUICK SCREEN W PCR REFLEX
C DIFFICILE (CDIFF) INTERP: NOT DETECTED
C DIFFICILE (CDIFF) TOXIN: NEGATIVE
C Diff antigen: NEGATIVE

## 2018-04-23 DIAGNOSIS — F333 Major depressive disorder, recurrent, severe with psychotic symptoms: Secondary | ICD-10-CM | POA: Diagnosis not present

## 2018-05-18 DIAGNOSIS — M79651 Pain in right thigh: Secondary | ICD-10-CM | POA: Diagnosis not present

## 2018-05-31 DIAGNOSIS — Z794 Long term (current) use of insulin: Secondary | ICD-10-CM | POA: Diagnosis not present

## 2018-05-31 DIAGNOSIS — E1165 Type 2 diabetes mellitus with hyperglycemia: Secondary | ICD-10-CM | POA: Diagnosis not present

## 2018-06-07 DIAGNOSIS — E1122 Type 2 diabetes mellitus with diabetic chronic kidney disease: Secondary | ICD-10-CM | POA: Diagnosis not present

## 2018-06-07 DIAGNOSIS — N183 Chronic kidney disease, stage 3 (moderate): Secondary | ICD-10-CM | POA: Diagnosis not present

## 2018-06-07 DIAGNOSIS — E1159 Type 2 diabetes mellitus with other circulatory complications: Secondary | ICD-10-CM | POA: Diagnosis not present

## 2018-06-07 DIAGNOSIS — E1165 Type 2 diabetes mellitus with hyperglycemia: Secondary | ICD-10-CM | POA: Diagnosis not present

## 2018-06-07 DIAGNOSIS — Z794 Long term (current) use of insulin: Secondary | ICD-10-CM | POA: Diagnosis not present

## 2018-06-07 DIAGNOSIS — E1142 Type 2 diabetes mellitus with diabetic polyneuropathy: Secondary | ICD-10-CM | POA: Diagnosis not present

## 2018-06-21 DIAGNOSIS — G8929 Other chronic pain: Secondary | ICD-10-CM | POA: Diagnosis not present

## 2018-06-21 DIAGNOSIS — E1142 Type 2 diabetes mellitus with diabetic polyneuropathy: Secondary | ICD-10-CM | POA: Diagnosis not present

## 2018-06-21 DIAGNOSIS — M25561 Pain in right knee: Secondary | ICD-10-CM | POA: Diagnosis not present

## 2018-06-21 DIAGNOSIS — G4733 Obstructive sleep apnea (adult) (pediatric): Secondary | ICD-10-CM | POA: Diagnosis not present

## 2018-06-21 DIAGNOSIS — E1122 Type 2 diabetes mellitus with diabetic chronic kidney disease: Secondary | ICD-10-CM | POA: Diagnosis not present

## 2018-06-21 DIAGNOSIS — I700 Atherosclerosis of aorta: Secondary | ICD-10-CM | POA: Diagnosis not present

## 2018-06-21 DIAGNOSIS — I739 Peripheral vascular disease, unspecified: Secondary | ICD-10-CM | POA: Diagnosis not present

## 2018-06-21 DIAGNOSIS — I10 Essential (primary) hypertension: Secondary | ICD-10-CM | POA: Diagnosis not present

## 2018-06-21 DIAGNOSIS — F325 Major depressive disorder, single episode, in full remission: Secondary | ICD-10-CM | POA: Diagnosis not present

## 2018-06-21 DIAGNOSIS — Z96651 Presence of right artificial knee joint: Secondary | ICD-10-CM | POA: Diagnosis not present

## 2018-06-21 DIAGNOSIS — G2119 Other drug induced secondary parkinsonism: Secondary | ICD-10-CM | POA: Diagnosis not present

## 2018-06-25 DIAGNOSIS — F323 Major depressive disorder, single episode, severe with psychotic features: Secondary | ICD-10-CM | POA: Diagnosis not present

## 2018-06-30 ENCOUNTER — Observation Stay: Payer: Medicare HMO

## 2018-06-30 ENCOUNTER — Observation Stay
Admission: EM | Admit: 2018-06-30 | Discharge: 2018-07-01 | Disposition: A | Discharge status: Home / Self Care | Payer: Medicare HMO | Attending: Specialist | Admitting: Specialist

## 2018-06-30 ENCOUNTER — Other Ambulatory Visit: Payer: Self-pay

## 2018-06-30 ENCOUNTER — Emergency Department: Payer: Medicare HMO

## 2018-06-30 ENCOUNTER — Encounter: Payer: Self-pay | Admitting: Emergency Medicine

## 2018-06-30 DIAGNOSIS — N179 Acute kidney failure, unspecified: Secondary | ICD-10-CM | POA: Diagnosis not present

## 2018-06-30 DIAGNOSIS — K219 Gastro-esophageal reflux disease without esophagitis: Secondary | ICD-10-CM | POA: Diagnosis not present

## 2018-06-30 DIAGNOSIS — D631 Anemia in chronic kidney disease: Secondary | ICD-10-CM | POA: Insufficient documentation

## 2018-06-30 DIAGNOSIS — Z7982 Long term (current) use of aspirin: Secondary | ICD-10-CM | POA: Insufficient documentation

## 2018-06-30 DIAGNOSIS — G9340 Encephalopathy, unspecified: Principal | ICD-10-CM | POA: Insufficient documentation

## 2018-06-30 DIAGNOSIS — N189 Chronic kidney disease, unspecified: Secondary | ICD-10-CM | POA: Diagnosis not present

## 2018-06-30 DIAGNOSIS — G4733 Obstructive sleep apnea (adult) (pediatric): Secondary | ICD-10-CM | POA: Insufficient documentation

## 2018-06-30 DIAGNOSIS — F329 Major depressive disorder, single episode, unspecified: Secondary | ICD-10-CM | POA: Insufficient documentation

## 2018-06-30 DIAGNOSIS — D649 Anemia, unspecified: Secondary | ICD-10-CM

## 2018-06-30 DIAGNOSIS — Z880 Allergy status to penicillin: Secondary | ICD-10-CM | POA: Diagnosis not present

## 2018-06-30 DIAGNOSIS — M1990 Unspecified osteoarthritis, unspecified site: Secondary | ICD-10-CM | POA: Insufficient documentation

## 2018-06-30 DIAGNOSIS — E1140 Type 2 diabetes mellitus with diabetic neuropathy, unspecified: Secondary | ICD-10-CM | POA: Insufficient documentation

## 2018-06-30 DIAGNOSIS — G459 Transient cerebral ischemic attack, unspecified: Secondary | ICD-10-CM | POA: Insufficient documentation

## 2018-06-30 DIAGNOSIS — R42 Dizziness and giddiness: Secondary | ICD-10-CM

## 2018-06-30 DIAGNOSIS — R0989 Other specified symptoms and signs involving the circulatory and respiratory systems: Secondary | ICD-10-CM | POA: Diagnosis not present

## 2018-06-30 DIAGNOSIS — R29818 Other symptoms and signs involving the nervous system: Secondary | ICD-10-CM | POA: Diagnosis not present

## 2018-06-30 DIAGNOSIS — E7800 Pure hypercholesterolemia, unspecified: Secondary | ICD-10-CM | POA: Diagnosis not present

## 2018-06-30 DIAGNOSIS — R109 Unspecified abdominal pain: Secondary | ICD-10-CM | POA: Diagnosis not present

## 2018-06-30 DIAGNOSIS — E119 Type 2 diabetes mellitus without complications: Secondary | ICD-10-CM | POA: Diagnosis not present

## 2018-06-30 DIAGNOSIS — R404 Transient alteration of awareness: Secondary | ICD-10-CM | POA: Diagnosis not present

## 2018-06-30 DIAGNOSIS — F419 Anxiety disorder, unspecified: Secondary | ICD-10-CM | POA: Diagnosis not present

## 2018-06-30 DIAGNOSIS — E1122 Type 2 diabetes mellitus with diabetic chronic kidney disease: Secondary | ICD-10-CM | POA: Insufficient documentation

## 2018-06-30 DIAGNOSIS — Z135 Encounter for screening for eye and ear disorders: Secondary | ICD-10-CM | POA: Diagnosis not present

## 2018-06-30 DIAGNOSIS — I129 Hypertensive chronic kidney disease with stage 1 through stage 4 chronic kidney disease, or unspecified chronic kidney disease: Secondary | ICD-10-CM | POA: Insufficient documentation

## 2018-06-30 DIAGNOSIS — R4781 Slurred speech: Secondary | ICD-10-CM | POA: Diagnosis present

## 2018-06-30 DIAGNOSIS — Z794 Long term (current) use of insulin: Secondary | ICD-10-CM | POA: Insufficient documentation

## 2018-06-30 DIAGNOSIS — I639 Cerebral infarction, unspecified: Secondary | ICD-10-CM | POA: Diagnosis not present

## 2018-06-30 DIAGNOSIS — I10 Essential (primary) hypertension: Secondary | ICD-10-CM | POA: Diagnosis not present

## 2018-06-30 DIAGNOSIS — N183 Chronic kidney disease, stage 3 (moderate): Secondary | ICD-10-CM | POA: Diagnosis not present

## 2018-06-30 DIAGNOSIS — Z1389 Encounter for screening for other disorder: Secondary | ICD-10-CM

## 2018-06-30 DIAGNOSIS — R102 Pelvic and perineal pain: Secondary | ICD-10-CM | POA: Diagnosis not present

## 2018-06-30 LAB — DIFFERENTIAL
Abs Immature Granulocytes: 0.03 10*3/uL (ref 0.00–0.07)
BASOS ABS: 0 10*3/uL (ref 0.0–0.1)
Basophils Relative: 0 %
EOS ABS: 0.2 10*3/uL (ref 0.0–0.5)
EOS PCT: 3 %
IMMATURE GRANULOCYTES: 0 %
Lymphocytes Relative: 26 %
Lymphs Abs: 1.8 10*3/uL (ref 0.7–4.0)
MONO ABS: 0.8 10*3/uL (ref 0.1–1.0)
Monocytes Relative: 11 %
Neutro Abs: 4.1 10*3/uL (ref 1.7–7.7)
Neutrophils Relative %: 60 %

## 2018-06-30 LAB — CBC
HCT: 30.2 % — ABNORMAL LOW (ref 39.0–52.0)
Hemoglobin: 9.5 g/dL — ABNORMAL LOW (ref 13.0–17.0)
MCH: 27.3 pg (ref 26.0–34.0)
MCHC: 31.5 g/dL (ref 30.0–36.0)
MCV: 86.8 fL (ref 80.0–100.0)
NRBC: 0 % (ref 0.0–0.2)
PLATELETS: 258 10*3/uL (ref 150–400)
RBC: 3.48 MIL/uL — AB (ref 4.22–5.81)
RDW: 16.1 % — AB (ref 11.5–15.5)
WBC: 6.9 10*3/uL (ref 4.0–10.5)

## 2018-06-30 LAB — COMPREHENSIVE METABOLIC PANEL
ALBUMIN: 4 g/dL (ref 3.5–5.0)
ALT: 14 U/L (ref 0–44)
ANION GAP: 9 (ref 5–15)
AST: 20 U/L (ref 15–41)
Alkaline Phosphatase: 65 U/L (ref 38–126)
BUN: 36 mg/dL — ABNORMAL HIGH (ref 8–23)
CO2: 24 mmol/L (ref 22–32)
Calcium: 8.6 mg/dL — ABNORMAL LOW (ref 8.9–10.3)
Chloride: 107 mmol/L (ref 98–111)
Creatinine, Ser: 2.06 mg/dL — ABNORMAL HIGH (ref 0.61–1.24)
GFR calc non Af Amer: 31 mL/min — ABNORMAL LOW (ref >60)
GFR, EST AFRICAN AMERICAN: 35 mL/min — AB (ref >60)
GLUCOSE: 213 mg/dL — AB (ref 70–99)
POTASSIUM: 4.1 mmol/L (ref 3.5–5.1)
SODIUM: 140 mmol/L (ref 135–145)
Total Bilirubin: 0.3 mg/dL (ref 0.3–1.2)
Total Protein: 7 g/dL (ref 6.5–8.1)

## 2018-06-30 LAB — TROPONIN I: Troponin I: <0.03 ng/mL (ref <0.03)

## 2018-06-30 LAB — URINE DRUG SCREEN, QUALITATIVE (ARMC ONLY)
AMPHETAMINES, UR SCREEN: NOT DETECTED
Barbiturates, Ur Screen: NOT DETECTED
Benzodiazepine, Ur Scrn: NOT DETECTED
Cannabinoid 50 Ng, Ur ~~LOC~~: NOT DETECTED
Cocaine Metabolite,Ur ~~LOC~~: NOT DETECTED
MDMA (Ecstasy)Ur Screen: NOT DETECTED
METHADONE SCREEN, URINE: NOT DETECTED
OPIATE, UR SCREEN: NOT DETECTED
Phencyclidine (PCP) Ur S: NOT DETECTED
TRICYCLIC, UR SCREEN: NOT DETECTED

## 2018-06-30 LAB — URINALYSIS, ROUTINE W REFLEX MICROSCOPIC
Bilirubin Urine: NEGATIVE
GLUCOSE, UA: NEGATIVE mg/dL
Hgb urine dipstick: NEGATIVE
KETONES UR: NEGATIVE mg/dL
LEUKOCYTES UA: NEGATIVE
NITRITE: NEGATIVE
PROTEIN: NEGATIVE mg/dL
Specific Gravity, Urine: 1.005 (ref 1.005–1.030)
pH: 7 (ref 5.0–8.0)

## 2018-06-30 LAB — GLUCOSE, CAPILLARY
GLUCOSE-CAPILLARY: 189 mg/dL — AB (ref 70–99)
GLUCOSE-CAPILLARY: 200 mg/dL — AB (ref 70–99)
GLUCOSE-CAPILLARY: 185 mg/dL — AB (ref 70–99)

## 2018-06-30 LAB — PROTIME-INR
INR: 1.06
PROTHROMBIN TIME: 13.7 s (ref 11.4–15.2)

## 2018-06-30 LAB — APTT: APTT: 29 s (ref 24–36)

## 2018-06-30 LAB — ETHANOL

## 2018-06-30 MED ORDER — ONDANSETRON HCL 4 MG PO TABS: 4.0000 mg | ORAL_TABLET | Freq: Four times a day (QID) | ORAL | Status: DC | PRN

## 2018-06-30 MED ORDER — PANTOPRAZOLE SODIUM 40 MG PO TBEC
40.0000 mg | DELAYED_RELEASE_TABLET | Freq: Every day | ORAL | Status: DC
  Administered 2018-06-30 – 2018-07-01 (×2): 40 mg via ORAL
  Filled 2018-06-30 (×2): qty 1

## 2018-06-30 MED ORDER — LEVETIRACETAM IN NACL 1000 MG/100ML IV SOLN
1000.0000 mg | Freq: Once | INTRAVENOUS | Status: AC
  Administered 2018-06-30: 1000 mg via INTRAVENOUS
  Filled 2018-06-30: qty 100

## 2018-06-30 MED ORDER — PAROXETINE HCL 20 MG PO TABS
40.0000 mg | ORAL_TABLET | Freq: Every day | ORAL | Status: DC
  Administered 2018-06-30: 40 mg via ORAL
  Filled 2018-06-30 (×2): qty 2

## 2018-06-30 MED ORDER — PIOGLITAZONE HCL 15 MG PO TABS
15.0000 mg | ORAL_TABLET | Freq: Every day | ORAL | Status: DC
  Administered 2018-06-30 – 2018-07-01 (×2): 15 mg via ORAL
  Filled 2018-06-30 (×2): qty 1

## 2018-06-30 MED ORDER — HEPARIN SODIUM (PORCINE) 5000 UNIT/ML IJ SOLN
5000.0000 [IU] | Freq: Three times a day (TID) | INTRAMUSCULAR | Status: DC
  Administered 2018-06-30 – 2018-07-01 (×3): 5000 [IU] via SUBCUTANEOUS
  Filled 2018-06-30 (×3): qty 1

## 2018-06-30 MED ORDER — ACETAMINOPHEN 650 MG RE SUPP: 650.0000 mg | Freq: Four times a day (QID) | RECTAL | Status: DC | PRN

## 2018-06-30 MED ORDER — QUETIAPINE FUMARATE 25 MG PO TABS
100.0000 mg | ORAL_TABLET | Freq: Every day | ORAL | Status: DC
  Administered 2018-06-30: 22:00:00 100 mg via ORAL
  Filled 2018-06-30: qty 4

## 2018-06-30 MED ORDER — ATORVASTATIN CALCIUM 20 MG PO TABS
80.0000 mg | ORAL_TABLET | Freq: Every day | ORAL | Status: DC
  Administered 2018-06-30: 80 mg via ORAL
  Filled 2018-06-30: qty 4

## 2018-06-30 MED ORDER — ONDANSETRON HCL 4 MG/2ML IJ SOLN: 4.0000 mg | Freq: Four times a day (QID) | INTRAMUSCULAR | Status: DC | PRN

## 2018-06-30 MED ORDER — INSULIN DETEMIR 100 UNIT/ML ~~LOC~~ SOLN
10.0000 [IU] | Freq: Every day | SUBCUTANEOUS | Status: DC
  Administered 2018-06-30: 10 [IU] via SUBCUTANEOUS
  Filled 2018-06-30 (×2): qty 0.1

## 2018-06-30 MED ORDER — GABAPENTIN 300 MG PO CAPS
400.0000 mg | ORAL_CAPSULE | Freq: Three times a day (TID) | ORAL | Status: DC
  Administered 2018-06-30 (×2): 400 mg via ORAL
  Filled 2018-06-30 (×3): qty 1

## 2018-06-30 MED ORDER — CLONAZEPAM 0.5 MG PO TABS
0.5000 mg | ORAL_TABLET | Freq: Every day | ORAL | Status: DC
  Administered 2018-06-30: 0.5 mg via ORAL
  Filled 2018-06-30: qty 1

## 2018-06-30 MED ORDER — MECLIZINE HCL 25 MG PO TABS
25.0000 mg | ORAL_TABLET | Freq: Three times a day (TID) | ORAL | Status: DC | PRN
  Filled 2018-06-30: qty 1

## 2018-06-30 MED ORDER — METFORMIN HCL ER 500 MG PO TB24
1000.0000 mg | ORAL_TABLET | Freq: Two times a day (BID) | ORAL | Status: DC
  Administered 2018-07-01: 10:00:00 1000 mg via ORAL
  Filled 2018-06-30 (×3): qty 2

## 2018-06-30 MED ORDER — VITAMIN B-12 1000 MCG PO TABS
1000.0000 ug | ORAL_TABLET | Freq: Every day | ORAL | Status: DC
  Administered 2018-06-30 – 2018-07-01 (×2): 1000 ug via ORAL
  Filled 2018-06-30 (×2): qty 1

## 2018-06-30 MED ORDER — SODIUM CHLORIDE 0.9 % IV BOLUS
1000.0000 mL | Freq: Once | INTRAVENOUS | Status: AC
  Administered 2018-06-30: 1000 mL via INTRAVENOUS

## 2018-06-30 MED ORDER — ACETAMINOPHEN 325 MG PO TABS: 650.0000 mg | ORAL_TABLET | Freq: Four times a day (QID) | ORAL | Status: DC | PRN

## 2018-06-30 MED ORDER — TOPIRAMATE 100 MG PO TABS
100.0000 mg | ORAL_TABLET | Freq: Two times a day (BID) | ORAL | Status: DC
  Administered 2018-06-30 – 2018-07-01 (×3): 100 mg via ORAL
  Filled 2018-06-30 (×4): qty 1

## 2018-06-30 MED ORDER — ASPIRIN 81 MG PO CHEW
81.0000 mg | CHEWABLE_TABLET | Freq: Every day | ORAL | Status: DC
  Administered 2018-06-30 – 2018-07-01 (×2): 81 mg via ORAL
  Filled 2018-06-30 (×2): qty 1

## 2018-06-30 NOTE — Plan of Care (Signed)

## 2018-06-30 NOTE — ED Notes (Signed)
Pt returned from ct by this RN.

## 2018-06-30 NOTE — Progress Notes (Signed)
   06/30/18 1545  Clinical Encounter Type  Visited With Patient  Visit Type Initial;Spiritual support  Referral From Nurse  Consult/Referral To Chaplain  Spiritual Encounters  Spiritual Needs Prayer;Other (Comment)   [facility] received a Rapid Response page for Patrick Turner. The patient was being treated by the care team and was alert and able to move all extremities. I prayed silently for the patient and staff. I will follow up as needed,

## 2018-06-30 NOTE — ED Notes (Signed)
Pt did orthostatic vitals. Pt seemed dizzy standing,but completed BP

## 2018-06-30 NOTE — Progress Notes (Signed)
PT notified this nurse stating the patient was not following commands compared to baseline of admission. Upon assessment, patient had a flat affect staring in one direction, slurred speech, unable to state his name when asked, BP 168/65, HR 80, 02 100% on RA, respirations 16, called a rapid response, notified Dr. Verdell Carmine. Dr. Verdell Carmine at bedside. Received verbal order for MRI.

## 2018-06-30 NOTE — ED Triage Notes (Signed)
EMS pt to rm 2 from work. EMS reports pt not normal self. Reports dizzy since around 11 pm

## 2018-06-30 NOTE — H&P (Signed)
[facility] NAME: Gabriela Giannelli    MR#:  [id number]  DATE OF BIRTH:  1946-07-04  DATE OF ADMISSION:  06/30/2018  PRIMARY CARE PHYSICIAN: Kirk Ruths, MD   REQUESTING/REFERRING PHYSICIAN: Dr. Lenise Arena.   CHIEF COMPLAINT:   Chief Complaint  Patient presents with  . Dizziness    HISTORY OF PRESENT ILLNESS:  Devyon Keator  is a 72 y.o. male with a known history of diabetes, hypertension, diabetic neuropathy, obstructive sleep apnea, anxiety, osteoarthritis, CKD stage III who presents to the hospital due to dizziness.  Patient says he was at work when he developed his dizziness and says it started when he started to reach for something above his head.  He denies any loss of consciousness, headache, nausea vomiting.  He denies any focal numbness or weakness.  He was recently admitted to the hospital in June of this past year due to similar symptoms and underwent an extensive neurologic work-up which was essentially negative.  He now returns with recurrent symptoms.  Patient denies any chest pains, shortness of breath, fever, chills, cough, melena, hematochezia or any other associated symptoms presently.  Due to his persistent dizziness hospitalist services were contacted for admission.  PAST MEDICAL HISTORY:   Past Medical History:  Diagnosis Date  . Altered mental status   . Anxiety   . Arthritis   . CRD (chronic renal disease)   . Depression   . Diabetes mellitus without complication ([place])   . GERD (gastroesophageal reflux disease)   . Hypercholesteremia   . Hypertension   . Neuropathy   . Sleep apnea   . Sleep terror    per patient this year per patient     PAST SURGICAL HISTORY:   Past Surgical History:  Procedure Laterality Date  . APPENDECTOMY    . CIRCUMCISION    . 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 KNEE ARTHROSCOPY Right   . TONSILLECTOMY      SOCIAL HISTORY:   Social History   Tobacco Use  . Smoking status: Never Smoker  . Smokeless tobacco: Never Used  Substance Use Topics  . Alcohol use: Yes    Comment: rare    FAMILY HISTORY:   Family History  Problem Relation Age of Onset  . Diabetes Mother   . Heart disease Mother   . Cancer Father   . COPD Father     DRUG ALLERGIES:   Allergies  Allergen Reactions  . Penicillins Anaphylaxis    Has patient had a PCN reaction causing immediate rash, facial/tongue/throat swelling, SOB or lightheadedness with hypotension: Yes Has patient had a PCN reaction causing severe rash involving mucus membranes or skin necrosis: No Has patient had a PCN reaction that required hospitalization Yes Has patient had a PCN reaction occurring within the last 10 years: No If all of the above answers are "NO", then may proceed with Cephalosporin use.  Simona Huh [Clemastine] Swelling    REVIEW OF SYSTEMS:   Review of Systems  Constitutional: Negative for fever and weight loss.  HENT: Negative for congestion, nosebleeds and tinnitus.   Eyes: Negative for blurred vision, double vision and redness.  Respiratory: Negative for cough, hemoptysis and shortness of breath.   Cardiovascular: Negative for chest pain, orthopnea, leg swelling and PND.  Gastrointestinal: Negative for abdominal pain, diarrhea,  melena, nausea and vomiting.  Genitourinary: Negative for dysuria, hematuria and urgency.  Musculoskeletal: Negative for falls and joint pain.  Neurological: Positive for dizziness. Negative for tingling, sensory change, focal weakness, seizures, weakness and headaches.  Endo/Heme/Allergies: Negative for polydipsia. Does not bruise/bleed easily.  Psychiatric/Behavioral: Negative for depression and memory loss. The patient is not nervous/anxious.     MEDICATIONS AT HOME:   Prior to Admission  medications   Medication Sig Start Date End Date Taking? Authorizing Provider  aspirin 81 MG tablet Take 1 tablet (81 mg total) by mouth daily. 02/23/18  Yes Henreitta Leber, MD  atorvastatin (LIPITOR) 80 MG tablet Take 80 mg by mouth at bedtime. 12/14/15  Yes [provider]  clonazePAM (KLONOPIN) 1 MG tablet Take 0.5 mg by mouth at bedtime. 12/18/15  Yes [provider]  gabapentin (NEURONTIN) 400 MG capsule Take 400 mg by mouth 3 (three) times daily.   Yes [provider]  LEVEMIR 100 UNIT/ML injection Inject 10 Units into the skin at bedtime.    Yes [provider]  lisinopril-hydrochlorothiazide (PRINZIDE,ZESTORETIC) 20-12.5 MG per tablet Take 1 tablet by mouth daily.    Yes [provider]  metFORMIN (GLUCOPHAGE-XR) 500 MG 24 hr tablet Take 1,000 mg by mouth 2 (two) times daily.   Yes [provider]  omeprazole (PRILOSEC) 40 MG capsule Take 40 mg by mouth every morning.   Yes [provider]  PARoxetine (PAXIL) 40 MG tablet Take 40 mg by mouth at bedtime.   Yes [provider]  pioglitazone (ACTOS) 15 MG tablet Take 15 mg by mouth daily.   Yes [provider]  QUEtiapine (SEROQUEL) 100 MG tablet Take 100 mg by mouth at bedtime. 12/22/15  Yes [provider]  topiramate (TOPAMAX) 100 MG tablet Take 100 mg by mouth 2 (two) times daily.   Yes [provider]  vitamin B-12 (CYANOCOBALAMIN) 1000 MCG tablet Take 1,000 mcg by mouth daily.    [provider]      VITAL SIGNS:  Blood pressure (!) 121/55, pulse 68, temperature (!) 97.4 F (36.3 C), temperature source Oral, resp. rate 15, height 5\' 5"  (1.651 m), weight 88.5 kg, SpO2 98 %.  PHYSICAL EXAMINATION:  Physical Exam  GENERAL:  72 y.o.-year-old patient lying in the bed in no acute distress.  EYES: Pupils equal, round, reactive to light and accommodation. No scleral icterus. Extraocular muscles intact.  HEENT: Head atraumatic,  normocephalic. Oropharynx and nasopharynx clear. No oropharyngeal erythema, moist oral mucosa  NECK:  Supple, no jugular venous distention. No thyroid enlargement, no tenderness.  LUNGS: Normal breath sounds bilaterally, no wheezing, rales, rhonchi. No use of accessory muscles of respiration.  CARDIOVASCULAR: S1, S2 RRR. No murmurs, rubs, gallops, clicks.  ABDOMEN: Soft, nontender, nondistended. Bowel sounds present. No organomegaly or mass.  EXTREMITIES: No pedal edema, cyanosis, or clubbing. + 2 pedal & radial pulses b/l.   NEUROLOGIC: Cranial nerves II through XII are intact. No focal Motor or sensory deficits appreciated b/l PSYCHIATRIC: The patient is alert and oriented x 3. Good affect.  SKIN: No obvious rash, lesion, or ulcer.   LABORATORY PANEL:   CBC Recent Labs  Lab 06/30/18 0604  WBC 6.9  HGB 9.5*  HCT 30.2*  PLT 258   ------------------------------------------------------------------------------------------------------------------  Chemistries  Recent Labs  Lab 06/30/18 0604  NA 140  K 4.1  CL 107  CO2 24  GLUCOSE 213*  BUN 36*  CREATININE 2.06*  CALCIUM 8.6*  AST 20  ALT  14  ALKPHOS 65  BILITOT 0.3   ------------------------------------------------------------------------------------------------------------------  Cardiac Enzymes Recent Labs  Lab 06/30/18 0604  TROPONINI <0.03   ------------------------------------------------------------------------------------------------------------------  RADIOLOGY:  Ct Head Wo Contrast  Result Date: 06/30/2018 CLINICAL DATA:  Focal neuro deficit, > 6 hrs, stroke suspected. Patient reports dizziness. EXAM: CT HEAD WITHOUT CONTRAST TECHNIQUE: Contiguous axial images were obtained from the base of the skull through the vertex without intravenous contrast. 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 Age related atrophy. No  intracranial hemorrhage, mass effect, or midline shift. No hydrocephalus. The basilar cisterns are patent. No evidence of territorial infarct or acute ischemia. No extra-axial or intracranial fluid collection. Vascular: No hyperdense vessel. Skull: No fracture or focal lesion. Sinuses/Orbits: Paranasal sinuses and mastoid air cells are clear. The visualized orbits are unremarkable. Other: None. IMPRESSION: No acute intracranial abnormality. Electronically Signed   By: Keith Rake M.D.   On: 06/30/2018 06:26     IMPRESSION AND PLAN:   72 year old male with past medical history of hypertension, diabetes, diabetic neuropathy, anxiety, osteoarthritis, CKD stage III who presents to the hospital due to dizziness.  1.  Dizziness-etiology unclear but suspected to be secondary to vertigo.  Patient's dizziness is mostly positional in nature.  Will check orthostatic vital signs, hold antihypertensives for now. -Patient CT head is negative, I will not repeat any further neurologic work-up as he recently had a MRI/MRA and carotid duplex which were essentially benign a few months back.  I will also not repeat his carotid duplex and echocardiogram as they were recently done which were normal. - We will start him on some meclizine as needed, get a physical therapy consult to assess for vestibular testing, patient may need outpatient ENT referral.  2.  Diabetes type 2 with CKD stage III- continue metformin, Levemir, Actos. - Carb controlled diet, follow blood sugars.  3.  Essential hypertension-we will hold antihypertensives for now, check orthostatic vital signs first.  4.  Diabetic neuropathy-continue gabapentin.  5.  Anxiety-continue Klonopin, Paxil.  6.  Hyperlipidemia-continue atorvastatin.  7.  GERD-continue Protonix.   All the records are reviewed and case discussed with ED provider. Management plans discussed with the patient, family and they are in agreement.  CODE STATUS: Full  code  TOTAL TIME TAKING CARE OF THIS PATIENT: 45 minutes.    Henreitta Leber M.D on 06/30/2018 at 8:53 AM  Between 7am to 6pm - Pager - [phone]  After 6pm go to [email or web address] - password [email or web address] [facility]  [facility]  Office  [phone]  CC: Primary care physician; Kirk Ruths, MD

## 2018-06-30 NOTE — Care Management Obs Status (Signed)
[facility] NOTIFICATION   Patient Details  Name: Patrick Turner MRN: [id number] Date of Birth: [birth year]   Medicare Observation Status Notification Given:  Yes    Aileana Hodder A Alvy Alsop, RN 06/30/2018, 2:00 PM

## 2018-06-30 NOTE — Evaluation (Signed)
Physical Therapy Evaluation Patient Details Name: Patrick Turner MRN: [id number] DOB: July 23, 1946 Today's Date: 06/30/2018   History of Present Illness  Pt is a 72 y.o. male presenting to hospital 06/30/18 with slurred speech and dizziness (started after reaching for something above head); pt describing dizziness as imbalance and vertigo.  CT of head negative for acute findings.  PMH includes R TKA, neuropathy, CKD, DM, htn, and TIA.  Clinical Impression  Prior to hospital admission, pt was independent with ambulation.  Pt lives with his spouse in 2 level home.  PT consult for "Dizzines, Ataxia. Do vestibular work with patient please".  No dizziness/nystagmus noted with visual testing looking R/L/up/down with eyes and head movements laying in bed.  Performed Marye Round to R and no dizziness or nystagmus noted.  Pt then sat up long-sitting in bed and appearing dizzy (no nystagmus noted) and swaying a little so therapist assisted pt with laying down in bed.  After pt layed down in bed, pt noted to be very slow to respond and when he did, voice was very soft (change compared to beginning of session).  Nurse notifed immediately and came to assess.  Rapid response called.  Pt still able to state his name and DOB accurately.  Also able to move B UE's and B LE's but with increased effort/time to perform.  Nursing staff and MD present attending to pt's needs.  Additional testing ordered by MD.  Will hold PT at this time until further testing complete and pt determined to be appropriate to participate with physical therapy.    Follow Up Recommendations Other (comment)(TBD after OOB mobility assessment)    Equipment Recommendations       Recommendations for Other Services       Precautions / Restrictions Precautions Precautions: Fall Restrictions Weight Bearing Restrictions: No      Mobility  Bed Mobility               General bed mobility comments: Deferred OOB assessment  Transfers                 General transfer comment: Deferred OOB assessment  Ambulation/Gait             General Gait Details: Deferred OOB assessment  Stairs            Wheelchair Mobility    Modified Rankin (Stroke Patients Only)       Balance Overall balance assessment: (initially steady long sitting in bed with B UE support)                                           Pertinent Vitals/Pain Pain Assessment: No/denies pain    Home Living Family/patient expects to be discharged to:: Private residence Living Arrangements: Spouse/significant other Available Help at Discharge: Family;Available PRN/intermittently Type of Home: House Home Access: Stairs to enter Entrance Stairs-Rails: Right;Left;Can reach both Entrance Stairs-Number of Steps: 2 Home Layout: Two level;Bed/bath upstairs Home Equipment: Walker - 2 wheels;Cane - single point;Shower seat      Prior Function Level of Independence: Independent         Comments: Pt working full time at [employer]     Hand Dominance        Extremity/Trunk Assessment   Upper Extremity Assessment Upper Extremity Assessment: (at least 3/5 AROM elbow flexion/extension and shoulder flexion B beginning of session in bed (plan  to finish MMT sitting edge of bed but deferred d/t no OOB assessed))    Lower Extremity Assessment Lower Extremity Assessment: (at least 3/5 AROM hip flexion and DF B beginning of session in bed (plan to finish MMT sitting edge of bed but deferred d/t no OOB assessed))    Cervical / Trunk Assessment Cervical / Trunk Assessment: Normal  Communication   Communication: No difficulties(at beginning of session)  Cognition Arousal/Alertness: Awake/alert Behavior During Therapy: WFL for tasks assessed/performed(at beginning of session WFL; slow to respond end of session) Overall Cognitive Status: (A&O x4)                                        General Comments    Nursing cleared pt for participation in physical therapy.  Pt agreeable to PT session.  Neck ROM screened prior to initiating vestibular testing and no concerns noted for [facility] test (pt also reporting no issues).  Pt educated on and demonstration given for [employer] test.    Exercises     Assessment/Plan    PT Assessment Patient needs continued PT services  PT Problem List Decreased balance;Decreased mobility       PT Treatment Interventions DME instruction;Gait training;Stair training;Functional mobility training;Therapeutic activities;Therapeutic exercise;Balance training;Patient/family education    PT Goals (Current goals can be found in the Care Plan section)  Acute Rehab PT Goals Patient Stated Goal: to improve dizziness PT Goal Formulation: With patient Time For Goal Achievement: 07/14/18 Potential to Achieve Goals: Fair    Frequency Min 2X/week   Barriers to discharge        Co-evaluation               AM-PAC PT "6 Clicks" Daily Activity  Outcome Measure Difficulty turning over in bed (including adjusting bedclothes, sheets and blankets)?: A Little Difficulty moving from lying on back to sitting on the side of the bed? : A Little Difficulty sitting down on and standing up from a chair with arms (e.g., wheelchair, bedside commode, etc,.)?: Unable Help needed moving to and from a bed to chair (including a wheelchair)?: A Little Help needed walking in hospital room?: A Little Help needed climbing 3-5 steps with a railing? : A Lot 6 Click Score: 15    End of Session   Activity Tolerance: Treatment limited secondary to medical complications (Comment)(dizziness and then decreased responsiveness) Patient left: in bed;with nursing/sitter in room(MD in room) Nurse Communication: Precautions;Other (comment)(Pt's symptoms and vestibular test performed communicated to nursing staff and MD) PT Visit Diagnosis: Dizziness and giddiness (R42);Unsteadiness on feet  (R26.81);Other abnormalities of gait and mobility (R26.89)    Time: 7782-4235 PT Time Calculation (min) (ACUTE ONLY): 22 min   Charges:   PT Evaluation $PT Eval Low Complexity: 1 Low        Xzandria Clevinger, PT 06/30/18, 5:02 PM [phone]

## 2018-06-30 NOTE — ED Notes (Signed)
Delay in giving report due to this RN's availability.

## 2018-06-30 NOTE — Progress Notes (Signed)
She admitted earlier secondary to dizziness and suspected vertigo.  A rapid response was called this afternoon as patient became more altered and confused and would not respond.  Physical Exam  Gen- upon arrival pt. Was lying in bed in NAD and respond to verbal commands but slowly.  Neuro - AAO X 1.  Follows simple commands. No facial asymmetry noted. Heart-S1-S2-no murmurs, rubs, clicks Lungs-clear to auscultation bilaterally no rales, rhonchi, wheezes.   Assessment and plan  1.  Altered mental status/confusion-etiology unclear.  Initially admitted for suspicion of vertigo, but patient appears more postictal.  No seizure type activity was noted. - We will empirically give the patient IV Keppra, get MRI of the brain, MRA of the head and neck. -Get neurology consult and discussed the case with Dr. Irish Elders.  Time Spent: 25 min.

## 2018-06-30 NOTE — ED Notes (Signed)
Pt to ct with this RN

## 2018-06-30 NOTE — ED Provider Notes (Signed)
[facility] Emergency Department Provider Note  ____________________________________________  Time seen: Approximately 6:05 AM  I have reviewed the triage vital signs and the nursing notes.   HISTORY  Chief Complaint Dizziness   HPI Patrick Turner is a 72 y.o. male with a history of chronic kidney disease, diabetes, hypertension, hyperlipidemia, TIA who presents for evaluation of slurred speech and dizziness.  Patient works third shift at [employer] came in the store to buy a drink and found patient slurring, complaining of dizziness. Patient known to EMS staff and this is a big change from baseline.  Patient reports that he left for work at 11PM and since arriving at work has been feeling dizzy and having slurred speech. He describes the dizziness as imbalance and vertigo.  No lightheadedness.  No chest pain or shortness of breath, no headache, no unilateral weakness or numbness, no trauma.  Patient is not on any blood thinners.  Per EMS blood glucose and vitals were within normal limits.  EKG showing no ischemic changes.  Past Medical History:  Diagnosis Date  . Altered mental status   . Anxiety   . Arthritis   . CRD (chronic renal disease)   . Depression   . Diabetes mellitus without complication ([place])   . GERD (gastroesophageal reflux disease)   . Hypercholesteremia   . Hypertension   . Neuropathy   . Sleep apnea   . Sleep terror    per patient this year per patient     Patient Active Problem List   Diagnosis Date Noted  . Diabetes ([place]) 02/23/2018  . HTN (hypertension) 02/23/2018  . HLD (hyperlipidemia) 02/23/2018  . GERD (gastroesophageal reflux disease) 02/23/2018  . TIA (transient ischemic attack) 07/12/2016  . 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 Acute diarrhea 05/30/2016  . Acute encephalopathy 05/30/2016  . Anemia 05/30/2016  . Acute on chronic renal insufficiency 05/30/2016  . Diarrhea 05/30/2016  . Unspecified transient  cerebral ischemia 05/29/2012    Past Surgical History:  Procedure Laterality Date  . APPENDECTOMY    . CIRCUMCISION    . 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 KNEE ARTHROSCOPY Right   . TONSILLECTOMY      Prior to Admission medications   Medication Sig Start Date End Date Taking? Authorizing Provider  aspirin 81 MG tablet Take 1 tablet (81 mg total) by mouth daily. 02/23/18   Henreitta Leber, MD  atorvastatin (LIPITOR) 80 MG tablet Take 80 mg by mouth at bedtime. 12/14/15   [provider]  clonazePAM (KLONOPIN) 1 MG tablet Take 0.5 mg by mouth at bedtime. 12/18/15   [provider]  gabapentin (NEURONTIN) 400 MG capsule Take 400 mg by mouth 3 (three) times daily.    [provider]  LEVEMIR 100 UNIT/ML injection Inject 10-12 Units into the skin at bedtime.     [provider]  lisinopril-hydrochlorothiazide (PRINZIDE,ZESTORETIC) 20-12.5 MG per tablet Take 1 tablet by mouth daily.     [provider]  metFORMIN (GLUCOPHAGE-XR) 500 MG 24 hr tablet Take 1,000 mg by mouth 2 (two) times daily.    [provider]  omeprazole (PRILOSEC) 40 MG capsule Take 40 mg by mouth every morning.    [provider]  PARoxetine (PAXIL) 40 MG tablet Take 40 mg by mouth at bedtime.    [provider]  QUEtiapine (SEROQUEL) 100 MG tablet Take 100 mg by mouth at bedtime. 12/22/15   [provider]  topiramate (TOPAMAX) 100 MG tablet Take 100 mg by mouth 2 (two) times daily.    [provider]  vitamin B-12 (CYANOCOBALAMIN) 1000 MCG tablet Take 1,000 mcg by mouth daily.    [provider]    Allergies Penicillins and Tavist [clemastine]  Family History  Problem Relation Age of Onset  . Diabetes Mother   . Heart disease Mother   . Cancer Father   . COPD Father      Social History Social History   Tobacco Use  . Smoking status: Never Smoker  . Smokeless tobacco: Never Used  Substance Use Topics  . Alcohol use: Yes    Comment: rare  . Drug use: No    Review of Systems  Constitutional: Negative for fever. Eyes: Negative for visual changes. ENT: Negative for sore throat. Neck: No neck pain  Cardiovascular: Negative for chest pain. Respiratory: Negative for shortness of breath. Gastrointestinal: Negative for abdominal pain, vomiting or diarrhea. Genitourinary: Negative for dysuria. Musculoskeletal: Negative for back pain. Skin: Negative for rash. Neurological: Negative for headaches, weakness or numbness. + Slurred speech and dizziness Psych: No SI or HI  ____________________________________________   PHYSICAL EXAM:  VITAL SIGNS: Vitals:   06/30/18 0619  BP: 138/62  Pulse: 68  Resp: 18  Temp: (!) 97.4 F (36.3 C)  SpO2: 100%   Constitutional: Alert and oriented, slurred speech, looks pale.  HEENT:      Head: Normocephalic and atraumatic.         Eyes: Conjunctivae are normal. Sclera is non-icteric.       Mouth/Throat: Mucous membranes are moist.       Neck: Supple with no signs of meningismus. Cardiovascular: Regular rate and rhythm. No murmurs, gallops, or rubs. 2+ symmetrical distal pulses are present in all extremities. No JVD. Respiratory: Normal respiratory effort. Lungs are clear to auscultation bilaterally. No wheezes, crackles, or rhonchi.  Gastrointestinal: Soft, non tender, and non distended with positive bowel sounds. No rebound or guarding. Musculoskeletal: Nontender with normal range of motion in all extremities. No edema, cyanosis, or erythema of extremities. Neurologic: Slurred speech, normal language. A & O x3, PERRL, EOMI, no nystagmus, CN II-XII intact, motor testing reveals good tone and bulk throughout. There is no evidence of pronator drift or dysmetria. Muscle strength is 5/5 throughout.Sensory  examination is intact. Gait deferred. Skin: Skin is warm, dry and intact. No rash noted. Psychiatric: Mood and affect are normal. Speech and behavior are normal.  ____________________________________________   LABS (all labs ordered are listed, but only abnormal results are displayed)  Labs Reviewed  CBC - Abnormal; Notable for the following components:      Result Value   RBC 3.48 (*)    Hemoglobin 9.5 (*)    HCT 30.2 (*)    RDW 16.1 (*)    All other components within normal limits  COMPREHENSIVE METABOLIC PANEL - Abnormal; Notable for the following components:   Glucose, Bld 213 (*)    BUN 36 (*)    Creatinine, Ser 2.06 (*)    Calcium 8.6 (*)    GFR calc non Af Amer 31 (*)    GFR calc Af Amer 35 (*)    All other components within normal limits  GLUCOSE, CAPILLARY - Abnormal; Notable for the following components:   Glucose-Capillary 189 (*)    All other components within normal limits  DIFFERENTIAL  TROPONIN I  PROTIME-INR  APTT  URINE DRUG SCREEN, QUALITATIVE ([facility] ONLY)  URINALYSIS, ROUTINE W REFLEX MICROSCOPIC  ETHANOL   ____________________________________________  EKG  ED ECG REPORT I, Rudene Re, the attending physician, personally viewed and interpreted this ECG.  Normal sinus rhythm, rate of 71, normal intervals, normal axis, no ST elevations or depressions.  Normal EKG.   ____________________________________________  RADIOLOGY  I have personally reviewed the images performed during this visit and I agree with the Radiologist's read.   Interpretation by Radiologist:  Ct Head Wo Contrast  Result Date: 06/30/2018 CLINICAL DATA:  Focal neuro deficit, > 6 hrs, stroke suspected. Patient reports dizziness. EXAM: CT HEAD WITHOUT CONTRAST TECHNIQUE: Contiguous axial images were obtained from the base of the skull through the vertex without intravenous contrast. 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 Age related atrophy. No intracranial hemorrhage, mass effect, or midline shift. No hydrocephalus. The basilar cisterns are patent. No evidence of territorial infarct or acute ischemia. No extra-axial or intracranial fluid collection. Vascular: No hyperdense vessel. Skull: No fracture or focal lesion. Sinuses/Orbits: Paranasal sinuses and mastoid air cells are clear. The visualized orbits are unremarkable. Other: None. IMPRESSION: No acute intracranial abnormality. Electronically Signed   By: Keith Rake M.D.   On: 06/30/2018 06:26      ____________________________________________   PROCEDURES  Procedure(s) performed: None Procedures Critical Care performed:  None ____________________________________________   INITIAL IMPRESSION / ASSESSMENT AND PLAN / ED COURSE  72 y.o. male with a history of chronic kidney disease, diabetes, hypertension, hyperlipidemia, TIA who presents for evaluation of slurred speech and dizziness.  Last seen normal 7 hours ago.  Patient does have slurred speech but otherwise has normal neuro exam.  Presentation concerning for stroke.  Patient outside of TPA window.  Blood glucose normal.  Vitals normal.  Patient taken straight to CT scan.  EKG with no dysrhythmias or ischemia  Clinical Course as of Jun 30 641  Sat Jun 30, 2018  0637 Labs show a two-point drop in patient's hemoglobin from last hospitalization back in June when patient was admitted with similar symptoms.  Rectal exam showing brown stool guaiac negative.  Patient is not on any blood thinners. No indication for transfusion. CT negative for acute finding. CMP showing acute on chronic kidney injury. Will give IVF. Discussed with Dr. Marcille Blanco for admission.   [CV]    Clinical Course User Index [CV] Alfred Levins [place], MD     As part of my medical decision making, I reviewed the following data within the [place] notes reviewed  and incorporated, Labs reviewed , EKG interpreted , Old EKG reviewed, Old chart reviewed, Radiograph reviewed , Discussed with admitting physician , Notes from prior ED visits and [place] Controlled Substance Database    Pertinent labs & imaging results that were available during my care of the patient were reviewed by me and considered in my medical decision making (see chart for details).    ____________________________________________   FINAL CLINICAL IMPRESSION(S) / ED DIAGNOSES  Final diagnoses:  Anemia, unspecified type  Acute kidney injury superimposed on chronic kidney disease ([place])  Slurred speech  Vertigo      NEW MEDICATIONS STARTED DURING THIS VISIT:  ED Discharge Orders    None       Note:  This document was prepared using Dragon voice recognition software and may include unintentional dictation errors.    Alfred Levins,  [place], [place] 06/30/18 [phone]

## 2018-07-01 DIAGNOSIS — E119 Type 2 diabetes mellitus without complications: Secondary | ICD-10-CM | POA: Diagnosis not present

## 2018-07-01 DIAGNOSIS — R42 Dizziness and giddiness: Secondary | ICD-10-CM

## 2018-07-01 DIAGNOSIS — I10 Essential (primary) hypertension: Secondary | ICD-10-CM | POA: Diagnosis not present

## 2018-07-01 DIAGNOSIS — E1140 Type 2 diabetes mellitus with diabetic neuropathy, unspecified: Secondary | ICD-10-CM | POA: Diagnosis not present

## 2018-07-01 LAB — BASIC METABOLIC PANEL
Anion gap: 5 (ref 5–15)
BUN: 31 mg/dL — AB (ref 8–23)
CHLORIDE: 114 mmol/L — AB (ref 98–111)
CO2: 23 mmol/L (ref 22–32)
Calcium: 9 mg/dL (ref 8.9–10.3)
Creatinine, Ser: 1.38 mg/dL — ABNORMAL HIGH (ref 0.61–1.24)
GFR calc Af Amer: 57 mL/min — ABNORMAL LOW (ref >60)
GFR calc non Af Amer: 50 mL/min — ABNORMAL LOW (ref >60)
Glucose, Bld: 135 mg/dL — ABNORMAL HIGH (ref 70–99)
POTASSIUM: 4.3 mmol/L (ref 3.5–5.1)
SODIUM: 142 mmol/L (ref 135–145)

## 2018-07-01 LAB — CBC
HCT: 28.1 % — ABNORMAL LOW (ref 39.0–52.0)
HEMOGLOBIN: 8.8 g/dL — AB (ref 13.0–17.0)
MCH: 26.8 pg (ref 26.0–34.0)
MCHC: 31.3 g/dL (ref 30.0–36.0)
MCV: 85.7 fL (ref 80.0–100.0)
NRBC: 0 % (ref 0.0–0.2)
Platelets: 231 10*3/uL (ref 150–400)
RBC: 3.28 MIL/uL — AB (ref 4.22–5.81)
RDW: 15.8 % — ABNORMAL HIGH (ref 11.5–15.5)
WBC: 5.7 10*3/uL (ref 4.0–10.5)

## 2018-07-01 LAB — GLUCOSE, CAPILLARY
GLUCOSE-CAPILLARY: 112 mg/dL — AB (ref 70–99)
GLUCOSE-CAPILLARY: 196 mg/dL — AB (ref 70–99)

## 2018-07-01 MED ORDER — GABAPENTIN 100 MG PO CAPS
200.0000 mg | ORAL_CAPSULE | Freq: Three times a day (TID) | ORAL | Status: DC
  Administered 2018-07-01: 200 mg via ORAL

## 2018-07-01 MED ORDER — GABAPENTIN 100 MG PO CAPS: 200.0000 mg | ORAL_CAPSULE | Freq: Three times a day (TID) | ORAL | 1 refills | Status: DC

## 2018-07-01 MED ORDER — MECLIZINE HCL 25 MG PO TABS: 25.0000 mg | ORAL_TABLET | Freq: Three times a day (TID) | ORAL | 0 refills | Status: DC | PRN

## 2018-07-01 NOTE — Consult Note (Signed)
Reason for Consult: confusion  Referring Physician: Dr. Verdell Carmine  CC: dizziness   HPI: Patrick Turner is an 72 y.o. male with a known history of diabetes, hypertension, diabetic neuropathy, obstructive sleep apnea, anxiety, osteoarthritis, CKD stage III who presents to the hospital due to dizziness.  Patient says he was at work when he developed his dizziness and says it started when he started to reach for something above his head.  He denies any loss of consciousness, headache, nausea vomiting.  He denies any focal numbness or weakness.  He was recently admitted to the hospital in June of this past year due to similar symptoms and underwent an extensive neurologic work-up which was essentially negative.  Dizziness was described as positional and now has improved.    Past Medical History:  Diagnosis Date  . Altered mental status   . Anxiety   . Arthritis   . CRD (chronic renal disease)   . Depression   . Diabetes mellitus without complication ([place])   . GERD (gastroesophageal reflux disease)   . Hypercholesteremia   . Hypertension   . Neuropathy   . Sleep apnea   . Sleep terror    per patient this year per patient     Past Surgical History:  Procedure Laterality Date  . APPENDECTOMY    . CIRCUMCISION    . 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 KNEE ARTHROSCOPY Right   . TONSILLECTOMY      Family History  Problem Relation Age of Onset  . Diabetes Mother   . Heart disease Mother   . Cancer Father   . COPD Father     Social History:  reports that he has never smoked. He has never used smokeless tobacco. He reports that he drinks alcohol. He reports that he does not use drugs.  Allergies  Allergen Reactions  . Penicillins Anaphylaxis    Has patient had a PCN reaction causing immediate rash, facial/tongue/throat swelling, SOB  or lightheadedness with hypotension: Yes Has patient had a PCN reaction causing severe rash involving mucus membranes or skin necrosis: No Has patient had a PCN reaction that required hospitalization Yes Has patient had a PCN reaction occurring within the last 10 years: No If all of the above answers are "NO", then may proceed with Cephalosporin use.  . Tavist [Clemastine] Swelling    Medications: I have reviewed the patient's current medications.  ROS: History obtained from the patient  General ROS: negative for - chills, fatigue, fever, night sweats, weight gain or weight loss Psychological ROS: negative for - behavioral disorder, hallucinations, memory difficulties, mood swings or suicidal ideation Ophthalmic ROS: negative for - blurry vision, double vision, eye pain or loss of vision ENT ROS: negative for - epistaxis, nasal discharge, oral lesions, sore throat, tinnitus or vertigo Allergy and Immunology ROS: negative for - hives or itchy/watery eyes Hematological and Lymphatic ROS: negative for - bleeding problems, bruising or swollen lymph nodes Endocrine ROS: negative for - galactorrhea, hair pattern changes, polydipsia/polyuria or temperature intolerance Respiratory ROS: negative for - cough, hemoptysis, shortness of breath or wheezing Cardiovascular ROS: negative for - chest pain, dyspnea on exertion, edema or irregular heartbeat Gastrointestinal ROS: negative for - abdominal pain, diarrhea, hematemesis, nausea/vomiting or stool incontinence Genito-Urinary ROS: negative for - dysuria, hematuria, incontinence or urinary frequency/urgency Musculoskeletal ROS: negative for - joint swelling or muscular weakness  Neurological ROS: as noted in HPI Dermatological ROS: negative for rash and skin lesion changes  Physical Examination: Blood pressure (!) 114/51, pulse (!) 57, temperature 97.9 F (36.6 C), resp. rate 15, height 5\' 5"  (1.651 m), weight 88.5 kg, SpO2 96 %.    Neurological  Examination   Mental Status: Alert, oriented, thought content appropriate.  Speech fluent without evidence of aphasia.  Able to follow 3 step commands without difficulty. Cranial Nerves: II: Discs flat bilaterally; Visual fields grossly normal, pupils equal, round, reactive to light and accommodation III,IV, VI: ptosis not present, extra-ocular motions intact bilaterally V,VII: smile symmetric, facial light touch sensation normal bilaterally VIII: hearing normal bilaterally IX,X: gag reflex present XI: bilateral shoulder shrug XII: midline tongue extension Motor: Right : Upper extremity   5/5    Left:     Upper extremity   5/5  Lower extremity   5/5     Lower extremity   5/5 Tone and bulk:normal tone throughout; no atrophy noted Sensory: Pinprick and light touch intact throughout, bilaterally Deep Tendon Reflexes: 2+ and symmetric throughout Plantars: Right: downgoing   Left: downgoing Cerebellar: normal finger-to-nose, normal rapid alternating movements and normal heel-to-shin test Gait: not tested      Laboratory Studies:   Basic Metabolic Panel: Recent Labs  Lab 06/30/18 0604 07/01/18 0309  NA 140 142  K 4.1 4.3  CL 107 114*  CO2 24 23  GLUCOSE 213* 135*  BUN 36* 31*  CREATININE 2.06* 1.38*  CALCIUM 8.6* 9.0    Liver Function Tests: Recent Labs  Lab 06/30/18 0604  AST 20  ALT 14  ALKPHOS 65  BILITOT 0.3  PROT 7.0  ALBUMIN 4.0   No results for input(s): LIPASE, AMYLASE in the last 168 hours. No results for input(s): AMMONIA in the last 168 hours.  CBC: Recent Labs  Lab 06/30/18 0604 07/01/18 0309  WBC 6.9 5.7  NEUTROABS 4.1  --   HGB 9.5* 8.8*  HCT 30.2* 28.1*  MCV 86.8 85.7  PLT 258 231    Cardiac Enzymes: Recent Labs  Lab 06/30/18 0604  TROPONINI <0.03    BNP: Invalid input(s): POCBNP  CBG: Recent Labs  Lab 06/30/18 0603 06/30/18 1549 06/30/18 2147 07/01/18 0737 07/01/18 1149  GLUCAP 189* 200* 185* 112* 196*     Microbiology: Results for orders placed or performed during the hospital encounter of 04/19/18  C difficile quick scan w PCR reflex     Status: None   Collection Time: 04/19/18  8:35 AM  Result Value Ref Range Status   C Diff antigen NEGATIVE NEGATIVE Final   C Diff toxin NEGATIVE NEGATIVE Final   C Diff interpretation No C. difficile detected.  Final    Comment: Performed at [facility], [address] [place], [place] [address]  Gastrointestinal Panel by PCR , Stool     Status: None   Collection Time: 04/19/18  8:35 AM  Result Value Ref Range Status   Campylobacter species NOT DETECTED NOT DETECTED Final   Plesimonas shigelloides NOT DETECTED NOT DETECTED Final   Salmonella species NOT DETECTED NOT DETECTED Final   Yersinia enterocolitica NOT DETECTED NOT DETECTED Final   Vibrio species NOT DETECTED NOT DETECTED Final   Vibrio cholerae NOT DETECTED NOT DETECTED Final   Enteroaggregative E coli (EAEC) NOT DETECTED NOT DETECTED Final   Enteropathogenic E coli (EPEC) NOT DETECTED NOT DETECTED Final   Enterotoxigenic E coli (ETEC) NOT DETECTED NOT DETECTED Final   Shiga like toxin producing  E coli (STEC) NOT DETECTED NOT DETECTED Final   Shigella/Enteroinvasive E coli (EIEC) NOT DETECTED NOT DETECTED Final   Cryptosporidium NOT DETECTED NOT DETECTED Final   Cyclospora cayetanensis NOT DETECTED NOT DETECTED Final   Entamoeba histolytica NOT DETECTED NOT DETECTED Final   Giardia lamblia NOT DETECTED NOT DETECTED Final   Adenovirus F40/41 NOT DETECTED NOT DETECTED Final   Astrovirus NOT DETECTED NOT DETECTED Final   Norovirus GI/GII NOT DETECTED NOT DETECTED Final   Rotavirus A NOT DETECTED NOT DETECTED Final   Sapovirus (I, II, IV, and V) NOT DETECTED NOT DETECTED Final    Comment: Performed at [facility], [address] [place], [place] [address]    Coagulation Studies: Recent Labs    06/30/18 0604  LABPROT 13.7  INR 1.06    Urinalysis:  Recent  Labs  Lab 06/30/18 0857  COLORURINE STRAW*  LABSPEC 1.005  PHURINE 7.0  GLUCOSEU NEGATIVE  HGBUR NEGATIVE  BILIRUBINUR NEGATIVE  KETONESUR NEGATIVE  PROTEINUR NEGATIVE  NITRITE NEGATIVE  LEUKOCYTESUR NEGATIVE    Lipid Panel:     Component Value Date/Time   CHOL 120 06/25/2017 0347   TRIG 136 06/25/2017 0347   HDL 50 06/25/2017 0347   CHOLHDL 2.4 06/25/2017 0347   VLDL 27 06/25/2017 0347   LDLCALC 43 06/25/2017 0347    HgbA1C:  Lab Results  Component Value Date   HGBA1C 8.3 (H) 06/26/2017    Urine Drug Screen:      Component Value Date/Time   LABOPIA NONE DETECTED 06/30/2018 0857   COCAINSCRNUR NONE DETECTED 06/30/2018 0857   LABBENZ NONE DETECTED 06/30/2018 0857   AMPHETMU NONE DETECTED 06/30/2018 0857   THCU NONE DETECTED 06/30/2018 0857   LABBARB NONE DETECTED 06/30/2018 0857    Alcohol Level:  Recent Labs  Lab 06/30/18 0641  ETH <10    Other results: EKG: normal EKG, normal sinus rhythm, unchanged from previous tracings.  Imaging: Dg Eye Foreign Body  Result Date: 06/30/2018 CLINICAL DATA:  Metal working/exposure; clearance prior to MRI EXAM: ORBITS FOR FOREIGN BODY - 2 VIEW COMPARISON:  None. FINDINGS: There is no evidence of metallic foreign body within the orbits. No significant bone abnormality identified. IMPRESSION: No evidence of metallic foreign body within the orbits. Electronically Signed   By: Inge Rise M.D.   On: 06/30/2018 16:48   Dg Chest 1 View  Result Date: 06/30/2018 CLINICAL DATA:  Clearance for MRI. EXAM: CHEST  1 VIEW COMPARISON:  CT chest 07/12/2016. Single-view of the chest 11/05/2015. FINDINGS: No radiopaque foreign body to per hip MRI is present. Lungs clear. Heart size normal. No pneumothorax or pleural effusion. No acute or focal bony abnormality. IMPRESSION: The patient may proceed to MRI. No acute disease. Electronically Signed   By: Inge Rise M.D.   On: 06/30/2018 16:42   Dg Pelvis 1-2 Views  Result Date:  06/30/2018 CLINICAL DATA:  Screening for MRI. EXAM: PELVIS - 1-2 VIEW COMPARISON:  None. FINDINGS: No radiopaque foreign body. There is no evidence of pelvic fracture or diastasis. No pelvic bone lesions are seen. IMPRESSION: The patient may proceed to MRI. Electronically Signed   By: Inge Rise M.D.   On: 06/30/2018 16:46   Dg Abd 1 View  Result Date: 06/30/2018 CLINICAL DATA:  Clearance for MRI. EXAM: ABDOMEN - 1 VIEW COMPARISON:  None. FINDINGS: No radiopaque foreign body to prevent MRI is present. Tiny radiopaque densities projecting over the anterior abdomen incidentally noted. Large volume of stool is present throughout the  colon. Bowel gas pattern is nonobstructive. IMPRESSION: The patient may proceed to MRI. Electronically Signed   By: Inge Rise M.D.   On: 06/30/2018 16:45   Ct Head Wo Contrast  Result Date: 06/30/2018 CLINICAL DATA:  Focal neuro deficit, > 6 hrs, stroke suspected. Patient reports dizziness. EXAM: CT HEAD WITHOUT CONTRAST TECHNIQUE: Contiguous axial images were obtained from the base of the skull through the vertex without intravenous contrast. 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 Age related atrophy. No intracranial hemorrhage, mass effect, or midline shift. No hydrocephalus. The basilar cisterns are patent. No evidence of territorial infarct or acute ischemia. No extra-axial or intracranial fluid collection. Vascular: No hyperdense vessel. Skull: No fracture or focal lesion. Sinuses/Orbits: Paranasal sinuses and mastoid air cells are clear. The visualized orbits are unremarkable. Other: None. IMPRESSION: No acute intracranial abnormality. Electronically Signed   By: Keith Rake M.D.   On: 06/30/2018 06:26   Mr Jodene Nam Head Wo Contrast  Result Date: 06/30/2018 CLINICAL DATA:  Stroke follow-up EXAM: MRI HEAD WITHOUT CONTRAST MRA HEAD WITHOUT CONTRAST MRA NECK  WITHOUT CONTRAST TECHNIQUE: Multiplanar, multiecho pulse sequences of the brain and surrounding structures were obtained without intravenous contrast. Angiographic images of the Circle of Willis were obtained using MRA technique without intravenous contrast. Angiographic images of the neck were obtained using MRA technique without intravenous contrast. Carotid stenosis measurements (when applicable) are obtained utilizing NASCET criteria, using the distal internal carotid diameter as the denominator. COMPARISON:  02/23/2013 brain MRI and intracranial MRA FINDINGS: MRI HEAD FINDINGS Brain: No acute infarction, hemorrhage, hydrocephalus, extra-axial collection or mass lesion. No white matter disease or atrophy. Fast protocol was required. Vascular: Major flow voids are preserved Skull and upper cervical spine: Negative for marrow lesion Sinuses/Orbits: Negative MRA HEAD FINDINGS Symmetric carotid and vertebral arteries. Accounting for motion artifact vessels are smooth and widely patent (an apparent mid basilar narrowing is at slab interface). Negative for aneurysm or vascular malformation. MRA NECK FINDINGS Faint visualization of the arch that is unremarkable. Both carotid and vertebral arteries are smooth and widely patent to the dura. Both vertebral origins are not visualized due to artifact and coverage limitations-commonly encountered on noncontrast neck MRA. IMPRESSION: Negative motion degraded brain MRI, intracranial MRA, neck MRA as described. Electronically Signed   By: Monte Fantasia M.D.   On: 06/30/2018 17:50   Mr Jodene Nam Neck Wo Contrast  Result Date: 06/30/2018 CLINICAL DATA:  Stroke follow-up EXAM: MRI HEAD WITHOUT CONTRAST MRA HEAD WITHOUT CONTRAST MRA NECK WITHOUT CONTRAST TECHNIQUE: Multiplanar, multiecho pulse sequences of the brain and surrounding structures were obtained without intravenous contrast. Angiographic images of the Circle of Willis were obtained using MRA technique without  intravenous contrast. Angiographic images of the neck were obtained using MRA technique without intravenous contrast. Carotid stenosis measurements (when applicable) are obtained utilizing NASCET criteria, using the distal internal carotid diameter as the denominator. COMPARISON:  02/23/2013 brain MRI and intracranial MRA FINDINGS: MRI HEAD FINDINGS Brain: No acute infarction, hemorrhage, hydrocephalus, extra-axial collection or mass lesion. No white matter disease or atrophy. Fast protocol was required. Vascular: Major flow voids are preserved Skull and upper cervical spine: Negative for marrow lesion Sinuses/Orbits: Negative MRA HEAD FINDINGS Symmetric carotid and vertebral arteries. Accounting for motion artifact vessels are smooth and widely patent (an apparent mid basilar narrowing is at slab interface). Negative for aneurysm or vascular malformation. MRA NECK FINDINGS Faint visualization of the arch that is  unremarkable. Both carotid and vertebral arteries are smooth and widely patent to the dura. Both vertebral origins are not visualized due to artifact and coverage limitations-commonly encountered on noncontrast neck MRA. IMPRESSION: Negative motion degraded brain MRI, intracranial MRA, neck MRA as described. Electronically Signed   By: Monte Fantasia M.D.   On: 06/30/2018 17:50   Mr Brain Wo Contrast  Result Date: 06/30/2018 CLINICAL DATA:  Stroke follow-up EXAM: MRI HEAD WITHOUT CONTRAST MRA HEAD WITHOUT CONTRAST MRA NECK WITHOUT CONTRAST TECHNIQUE: Multiplanar, multiecho pulse sequences of the brain and surrounding structures were obtained without intravenous contrast. Angiographic images of the Circle of Willis were obtained using MRA technique without intravenous contrast. Angiographic images of the neck were obtained using MRA technique without intravenous contrast. Carotid stenosis measurements (when applicable) are obtained utilizing NASCET criteria, using the distal internal carotid diameter  as the denominator. COMPARISON:  02/23/2013 brain MRI and intracranial MRA FINDINGS: MRI HEAD FINDINGS Brain: No acute infarction, hemorrhage, hydrocephalus, extra-axial collection or mass lesion. No white matter disease or atrophy. Fast protocol was required. Vascular: Major flow voids are preserved Skull and upper cervical spine: Negative for marrow lesion Sinuses/Orbits: Negative MRA HEAD FINDINGS Symmetric carotid and vertebral arteries. Accounting for motion artifact vessels are smooth and widely patent (an apparent mid basilar narrowing is at slab interface). Negative for aneurysm or vascular malformation. MRA NECK FINDINGS Faint visualization of the arch that is unremarkable. Both carotid and vertebral arteries are smooth and widely patent to the dura. Both vertebral origins are not visualized due to artifact and coverage limitations-commonly encountered on noncontrast neck MRA. IMPRESSION: Negative motion degraded brain MRI, intracranial MRA, neck MRA as described. Electronically Signed   By: Monte Fantasia M.D.   On: 06/30/2018 17:50     Assessment/Plan:  with a known history of diabetes, hypertension, diabetic neuropathy, obstructive sleep apnea, anxiety, osteoarthritis, CKD stage III who presents to the hospital due to dizziness.  Patient says he was at work when he developed his dizziness and says it started when he started to reach for something above his head.  He denies any loss of consciousness, headache, nausea vomiting.  He denies any focal numbness or weakness.  He was recently admitted to the hospital in June of this past year due to similar symptoms and underwent an extensive neurologic work-up which was essentially negative.  Dizziness was described as positional and now has improved.    Pt had episode of confusion and decreased responsiveness yesterday afternoon.  Difficult to arrouse. - MRI and MRA h/n completed and didn't show any acute abnormalities - 1g keppra given to make sure  no seizure activity - given the fact significant improvement and no hx of epilepsy or structdural abnormality given the MRI findings would not continue any anti epileptic medications.   - Dizziness improved - D/c planning with meclizine - Potentially out pt follow up with EEG.   07/01/2018, 12:21 PM

## 2018-07-01 NOTE — Plan of Care (Signed)
Patient resting well on his bed, not in any form of distress, administered medicines, tolerated well. No vertigo reported at the moment. Safety measures in place to include bed alarm. We will continue to monitor.

## 2018-07-01 NOTE — Care Management (Signed)
Pt to follow up with outpatient therapy for vestibular therapies. Faxed referral to rehab unit.

## 2018-07-01 NOTE — Discharge Summary (Signed)
[facility] NAME: Patrick Turner    MR#:  [id number]  DATE OF BIRTH:  September 19, 1945  DATE OF ADMISSION:  06/30/2018 ADMITTING PHYSICIAN: Henreitta Leber, MD  DATE OF DISCHARGE: 07/01/2018  PRIMARY CARE PHYSICIAN: Kirk Ruths, MD    ADMISSION DIAGNOSIS:  Slurred speech [R47.81] Vertigo [R42] Acute kidney injury superimposed on chronic kidney disease ([place]) [N17.9, N18.9] Anemia, unspecified type [D64.9]  DISCHARGE DIAGNOSIS:  Active Problems:   Dizziness   SECONDARY DIAGNOSIS:   Past Medical History:  Diagnosis Date  . Altered mental status   . Anxiety   . Arthritis   . CRD (chronic renal disease)   . Depression   . Diabetes mellitus without complication ([place])   . GERD (gastroesophageal reflux disease)   . Hypercholesteremia   . Hypertension   . Neuropathy   . Sleep apnea   . Sleep terror    per patient this year per patient     HOSPITAL COURSE:   72 year old male with past medical history of hypertension, diabetes, diabetic neuropathy, anxiety, osteoarthritis, CKD stage III who presents to the hospital due to dizziness.  1.  Dizziness/AMS- patient was admitted to the hospital due to persistent dizziness which was positional in nature.  Initially thought to be secondary to vertigo.  While in the hospital when patient was getting vestibular therapy with physical therapy he developed an episode when he became dizzy lethargic and was not as arousable.  Patient's vitals were stable, he underwent an extensive neurologic work-up including MRI of the head, MRA of the head and neck which were all negative for acute pathology. - Patient's orthostatic vital signs are also negative.  Seen by neurology and they do not think the patient has underlying seizures.  No plans for starting antiepileptics. - Patient is presently being discharged on meclizine as needed with outpatient ENT follow-up and evaluation.  2.  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  Essential hypertension- patient is not orthostatic, he will resume his antihypertensives as stated below.  4.  Diabetic neuropathy- patient will continue his gabapentin, his dose was lowered as possibly his dizziness could be coming from high doses of gabapentin and therefore his dose was lowered from 400 3 times daily to 200 3 times daily.  5.  Anxiety- pt. Will continue Klonopin, Paxil.  6.  Hyperlipidemia- pt. Will continue atorvastatin.  7.  GERD- pt. Will continue Protonix.  DISCHARGE CONDITIONS:   Stable.   CONSULTS OBTAINED:  Treatment Team:  [name], MD  DRUG ALLERGIES:   Allergies  Allergen Reactions  . Penicillins Anaphylaxis    Has patient had a PCN reaction causing immediate rash, facial/tongue/throat swelling, SOB or lightheadedness with hypotension: Yes Has patient had a PCN reaction causing severe rash involving mucus membranes or skin necrosis: No Has patient had a PCN reaction that required hospitalization Yes Has patient had a PCN reaction occurring within the last 10 years: No If all of the above answers are "NO", then may proceed with Cephalosporin use.  . Tavist [Clemastine] Swelling    DISCHARGE MEDICATIONS:   Allergies as of 07/01/2018      Reactions   Penicillins Anaphylaxis   Has patient had a PCN reaction causing immediate rash, facial/tongue/throat swelling, SOB or lightheadedness with hypotension: Yes Has patient had a PCN reaction causing severe rash involving mucus membranes or skin necrosis: No Has  patient had a PCN reaction that required hospitalization Yes Has patient had a PCN reaction occurring within the last 10 years: No If all of the above answers are "NO", then may proceed with Cephalosporin use.   Tavist [clemastine] Swelling      Medication List    TAKE these medications   aspirin 81 MG tablet Take  1 tablet (81 mg total) by mouth daily.   atorvastatin 80 MG tablet Commonly known as:  LIPITOR Take 80 mg by mouth at bedtime.   clonazePAM 1 MG tablet Commonly known as:  KLONOPIN Take 0.5 mg by mouth at bedtime.   gabapentin 100 MG capsule Commonly known as:  NEURONTIN Take 2 capsules (200 mg total) by mouth 3 (three) times daily. What changed:    medication strength  how much to take   LEVEMIR 100 UNIT/ML injection Generic drug:  insulin detemir Inject 10 Units into the skin at bedtime.   lisinopril-hydrochlorothiazide 20-12.5 MG tablet Commonly known as:  PRINZIDE,ZESTORETIC Take 1 tablet by mouth daily.   meclizine 25 MG tablet Commonly known as:  ANTIVERT Take 1 tablet (25 mg total) by mouth 3 (three) times daily as needed for dizziness.   metFORMIN 500 MG 24 hr tablet Commonly known as:  GLUCOPHAGE-XR Take 1,000 mg by mouth 2 (two) times daily.   omeprazole 40 MG capsule Commonly known as:  PRILOSEC Take 40 mg by mouth every morning.   PARoxetine 40 MG tablet Commonly known as:  PAXIL Take 40 mg by mouth at bedtime.   pioglitazone 15 MG tablet Commonly known as:  ACTOS Take 15 mg by mouth daily.   QUEtiapine 100 MG tablet Commonly known as:  SEROQUEL Take 100 mg by mouth at bedtime.   topiramate 100 MG tablet Commonly known as:  TOPAMAX Take 100 mg by mouth 2 (two) times daily.   vitamin B-12 1000 MCG tablet Commonly known as:  CYANOCOBALAMIN Take 1,000 mcg by mouth daily.         DISCHARGE INSTRUCTIONS:   DIET:  Cardiac diet and Diabetic diet  DISCHARGE CONDITION:  Stable  ACTIVITY:  Activity as tolerated  OXYGEN:  Home Oxygen: No.   Oxygen Delivery: room air  DISCHARGE LOCATION:  home   If you experience worsening of your admission symptoms, develop shortness of breath, life threatening emergency, suicidal or homicidal thoughts you must seek medical attention immediately by calling 911 or calling your MD immediately  if  symptoms less severe.  You Must read complete instructions/literature along with all the possible adverse reactions/side effects for all the Medicines you take and that have been prescribed to you. Take any new Medicines after you have completely understood and accpet all the possible adverse reactions/side effects.   Please note  You were cared for by a hospitalist during your hospital stay. If you have any questions about your discharge medications or the care you received while you were in the hospital after you are discharged, you can call the unit and asked to speak with the hospitalist on call if the hospitalist that took care of you is not available. Once you are discharged, your primary care physician will handle any further medical issues. Please note that NO REFILLS for any discharge medications will be authorized once you are discharged, as it is imperative that you return to your primary care physician (or establish a relationship with a primary care physician if you do not have one) for your aftercare needs so that they can reassess your need  for medications and monitor your lab values.     Today   No dizziness this morning, no headache, no focal numbness or weakness.  No seizure type activity overnight.  Will discharge home later today on meclizine as needed with outpatient ENT follow-up.  VITAL SIGNS:  Blood pressure (!) 114/51, pulse (!) 57, temperature 97.9 F (36.6 C), resp. rate 15, height 5\' 5"  (1.651 m), weight 88.5 kg, SpO2 96 %.  I/O:    Intake/Output Summary (Last 24 hours) at 07/01/2018 1216 Last data filed at 06/30/2018 2000 Gross per 24 hour  Intake 240 ml  Output -  Net 240 ml    PHYSICAL EXAMINATION:  GENERAL:  72 y.o.-year-old patient lying in the bed with no acute distress.  EYES: Pupils equal, round, reactive to light and accommodation. No scleral icterus. Extraocular muscles intact.  HEENT: Head atraumatic, normocephalic. Oropharynx and nasopharynx  clear.  NECK:  Supple, no jugular venous distention. No thyroid enlargement, no tenderness.  LUNGS: Normal breath sounds bilaterally, no wheezing, rales,rhonchi. No use of accessory muscles of respiration.  CARDIOVASCULAR: S1, S2 normal. No murmurs, rubs, or gallops.  ABDOMEN: Soft, non-tender, non-distended. Bowel sounds present. No organomegaly or mass.  EXTREMITIES: No pedal edema, cyanosis, or clubbing.  NEUROLOGIC: Cranial nerves II through XII are intact. No focal motor or sensory defecits b/l.  PSYCHIATRIC: The patient is alert and oriented x 3.  SKIN: No obvious rash, lesion, or ulcer.   DATA REVIEW:   CBC Recent Labs  Lab 07/01/18 0309  WBC 5.7  HGB 8.8*  HCT 28.1*  PLT 231    Chemistries  Recent Labs  Lab 06/30/18 0604 07/01/18 0309  NA 140 142  K 4.1 4.3  CL 107 114*  CO2 24 23  GLUCOSE 213* 135*  BUN 36* 31*  CREATININE 2.06* 1.38*  CALCIUM 8.6* 9.0  AST 20  --   ALT 14  --   ALKPHOS 65  --   BILITOT 0.3  --     Cardiac Enzymes Recent Labs  Lab 06/30/18 0604  TROPONINI <0.03     RADIOLOGY:  Dg Eye Foreign Body  Result Date: 06/30/2018 CLINICAL DATA:  Metal working/exposure; clearance prior to MRI EXAM: ORBITS FOR FOREIGN BODY - 2 VIEW COMPARISON:  None. FINDINGS: There is no evidence of metallic foreign body within the orbits. No significant bone abnormality identified. IMPRESSION: No evidence of metallic foreign body within the orbits. Electronically Signed   By: Inge Rise M.D.   On: 06/30/2018 16:48   Dg Chest 1 View  Result Date: 06/30/2018 CLINICAL DATA:  Clearance for MRI. EXAM: CHEST  1 VIEW COMPARISON:  CT chest 07/12/2016. Single-view of the chest 11/05/2015. FINDINGS: No radiopaque foreign body to per hip MRI is present. Lungs clear. Heart size normal. No pneumothorax or pleural effusion. No acute or focal bony abnormality. IMPRESSION: The patient may proceed to MRI. No acute disease. Electronically Signed   By: Inge Rise  M.D.   On: 06/30/2018 16:42   Dg Pelvis 1-2 Views  Result Date: 06/30/2018 CLINICAL DATA:  Screening for MRI. EXAM: PELVIS - 1-2 VIEW COMPARISON:  None. FINDINGS: No radiopaque foreign body. There is no evidence of pelvic fracture or diastasis. No pelvic bone lesions are seen. IMPRESSION: The patient may proceed to MRI. Electronically Signed   By: Inge Rise M.D.   On: 06/30/2018 16:46   Dg Abd 1 View  Result Date: 06/30/2018 CLINICAL DATA:  Clearance for MRI. EXAM: ABDOMEN - 1 VIEW COMPARISON:  None. FINDINGS: No radiopaque foreign body to prevent MRI is present. Tiny radiopaque densities projecting over the anterior abdomen incidentally noted. Large volume of stool is present throughout the colon. Bowel gas pattern is nonobstructive. IMPRESSION: The patient may proceed to MRI. Electronically Signed   By: Inge Rise M.D.   On: 06/30/2018 16:45   Ct Head Wo Contrast  Result Date: 06/30/2018 CLINICAL DATA:  Focal neuro deficit, > 6 hrs, stroke suspected. Patient reports dizziness. EXAM: CT HEAD WITHOUT CONTRAST TECHNIQUE: Contiguous axial images were obtained from the base of the skull through the vertex without intravenous contrast. 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 Age related atrophy. No intracranial hemorrhage, mass effect, or midline shift. No hydrocephalus. The basilar cisterns are patent. No evidence of territorial infarct or acute ischemia. No extra-axial or intracranial fluid collection. Vascular: No hyperdense vessel. Skull: No fracture or focal lesion. Sinuses/Orbits: Paranasal sinuses and mastoid air cells are clear. The visualized orbits are unremarkable. Other: None. IMPRESSION: No acute intracranial abnormality. Electronically Signed   By: Keith Rake M.D.   On: 06/30/2018 06:26   Mr Jodene Nam Head Wo Contrast  Result Date: 06/30/2018 CLINICAL DATA:  Stroke follow-up  EXAM: MRI HEAD WITHOUT CONTRAST MRA HEAD WITHOUT CONTRAST MRA NECK WITHOUT CONTRAST TECHNIQUE: Multiplanar, multiecho pulse sequences of the brain and surrounding structures were obtained without intravenous contrast. Angiographic images of the Circle of Willis were obtained using MRA technique without intravenous contrast. Angiographic images of the neck were obtained using MRA technique without intravenous contrast. Carotid stenosis measurements (when applicable) are obtained utilizing NASCET criteria, using the distal internal carotid diameter as the denominator. COMPARISON:  02/23/2013 brain MRI and intracranial MRA FINDINGS: MRI HEAD FINDINGS Brain: No acute infarction, hemorrhage, hydrocephalus, extra-axial collection or mass lesion. No white matter disease or atrophy. Fast protocol was required. Vascular: Major flow voids are preserved Skull and upper cervical spine: Negative for marrow lesion Sinuses/Orbits: Negative MRA HEAD FINDINGS Symmetric carotid and vertebral arteries. Accounting for motion artifact vessels are smooth and widely patent (an apparent mid basilar narrowing is at slab interface). Negative for aneurysm or vascular malformation. MRA NECK FINDINGS Faint visualization of the arch that is unremarkable. Both carotid and vertebral arteries are smooth and widely patent to the dura. Both vertebral origins are not visualized due to artifact and coverage limitations-commonly encountered on noncontrast neck MRA. IMPRESSION: Negative motion degraded brain MRI, intracranial MRA, neck MRA as described. Electronically Signed   By: Monte Fantasia M.D.   On: 06/30/2018 17:50   Mr Jodene Nam Neck Wo Contrast  Result Date: 06/30/2018 CLINICAL DATA:  Stroke follow-up EXAM: MRI HEAD WITHOUT CONTRAST MRA HEAD WITHOUT CONTRAST MRA NECK WITHOUT CONTRAST TECHNIQUE: Multiplanar, multiecho pulse sequences of the brain and surrounding structures were obtained without intravenous contrast. Angiographic images of the  Circle of Willis were obtained using MRA technique without intravenous contrast. Angiographic images of the neck were obtained using MRA technique without intravenous contrast. Carotid stenosis measurements (when applicable) are obtained utilizing NASCET criteria, using the distal internal carotid diameter as the denominator. COMPARISON:  02/23/2013 brain MRI and intracranial MRA FINDINGS: MRI HEAD FINDINGS Brain: No acute infarction, hemorrhage, hydrocephalus, extra-axial collection or mass lesion. No white matter disease or atrophy. Fast protocol was required. Vascular: Major flow voids are preserved Skull and upper cervical spine: Negative for marrow lesion Sinuses/Orbits: Negative MRA HEAD FINDINGS Symmetric carotid and vertebral arteries. Accounting for motion artifact vessels are  smooth and widely patent (an apparent mid basilar narrowing is at slab interface). Negative for aneurysm or vascular malformation. MRA NECK FINDINGS Faint visualization of the arch that is unremarkable. Both carotid and vertebral arteries are smooth and widely patent to the dura. Both vertebral origins are not visualized due to artifact and coverage limitations-commonly encountered on noncontrast neck MRA. IMPRESSION: Negative motion degraded brain MRI, intracranial MRA, neck MRA as described. Electronically Signed   By: Monte Fantasia M.D.   On: 06/30/2018 17:50   Mr Brain Wo Contrast  Result Date: 06/30/2018 CLINICAL DATA:  Stroke follow-up EXAM: MRI HEAD WITHOUT CONTRAST MRA HEAD WITHOUT CONTRAST MRA NECK WITHOUT CONTRAST TECHNIQUE: Multiplanar, multiecho pulse sequences of the brain and surrounding structures were obtained without intravenous contrast. Angiographic images of the Circle of Willis were obtained using MRA technique without intravenous contrast. Angiographic images of the neck were obtained using MRA technique without intravenous contrast. Carotid stenosis measurements (when applicable) are obtained utilizing  NASCET criteria, using the distal internal carotid diameter as the denominator. COMPARISON:  02/23/2013 brain MRI and intracranial MRA FINDINGS: MRI HEAD FINDINGS Brain: No acute infarction, hemorrhage, hydrocephalus, extra-axial collection or mass lesion. No white matter disease or atrophy. Fast protocol was required. Vascular: Major flow voids are preserved Skull and upper cervical spine: Negative for marrow lesion Sinuses/Orbits: Negative MRA HEAD FINDINGS Symmetric carotid and vertebral arteries. Accounting for motion artifact vessels are smooth and widely patent (an apparent mid basilar narrowing is at slab interface). Negative for aneurysm or vascular malformation. MRA NECK FINDINGS Faint visualization of the arch that is unremarkable. Both carotid and vertebral arteries are smooth and widely patent to the dura. Both vertebral origins are not visualized due to artifact and coverage limitations-commonly encountered on noncontrast neck MRA. IMPRESSION: Negative motion degraded brain MRI, intracranial MRA, neck MRA as described. Electronically Signed   By: Monte Fantasia M.D.   On: 06/30/2018 17:50      Management plans discussed with the patient, family and they are in agreement.  CODE STATUS:     Code Status Orders  (From admission, onward)         Start     Ordered   06/30/18 1049  Full code  Continuous     06/30/18 1048       TOTAL TIME TAKING CARE OF THIS PATIENT: 40 minutes.    Henreitta Leber M.D on 07/01/2018 at 12:16 PM  Between 7am to 6pm - Pager - [phone]  After 6pm go to [email or web address] - [employer] [facility]  Office  [phone]  CC: Primary care physician; Kirk Ruths, MD

## 2018-07-04 DIAGNOSIS — F325 Major depressive disorder, single episode, in full remission: Secondary | ICD-10-CM | POA: Diagnosis not present

## 2018-07-04 DIAGNOSIS — G2119 Other drug induced secondary parkinsonism: Secondary | ICD-10-CM | POA: Diagnosis not present

## 2018-07-09 ENCOUNTER — Encounter: Payer: Self-pay | Admitting: *Deleted

## 2018-07-10 ENCOUNTER — Encounter: Payer: Self-pay | Admitting: Anesthesiology

## 2018-07-10 ENCOUNTER — Ambulatory Visit: Payer: Medicare HMO | Admitting: Anesthesiology

## 2018-07-10 ENCOUNTER — Encounter
Admission: RE | Disposition: A | Discharge status: Home / Self Care | Payer: Self-pay | Source: Ambulatory Visit | Attending: Gastroenterology

## 2018-07-10 ENCOUNTER — Ambulatory Visit
Admission: RE | Admit: 2018-07-10 | Discharge: 2018-07-10 | Disposition: A | Discharge status: Home / Self Care | Payer: Medicare HMO | Source: Ambulatory Visit | Attending: Gastroenterology | Admitting: Gastroenterology

## 2018-07-10 DIAGNOSIS — E7800 Pure hypercholesterolemia, unspecified: Secondary | ICD-10-CM | POA: Insufficient documentation

## 2018-07-10 DIAGNOSIS — Z79899 Other long term (current) drug therapy: Secondary | ICD-10-CM | POA: Insufficient documentation

## 2018-07-10 DIAGNOSIS — Z794 Long term (current) use of insulin: Secondary | ICD-10-CM | POA: Insufficient documentation

## 2018-07-10 DIAGNOSIS — Z1211 Encounter for screening for malignant neoplasm of colon: Secondary | ICD-10-CM | POA: Diagnosis not present

## 2018-07-10 DIAGNOSIS — I2510 Atherosclerotic heart disease of native coronary artery without angina pectoris: Secondary | ICD-10-CM | POA: Insufficient documentation

## 2018-07-10 DIAGNOSIS — F419 Anxiety disorder, unspecified: Secondary | ICD-10-CM | POA: Insufficient documentation

## 2018-07-10 DIAGNOSIS — F329 Major depressive disorder, single episode, unspecified: Secondary | ICD-10-CM | POA: Diagnosis not present

## 2018-07-10 DIAGNOSIS — Z7982 Long term (current) use of aspirin: Secondary | ICD-10-CM | POA: Insufficient documentation

## 2018-07-10 DIAGNOSIS — Z8673 Personal history of transient ischemic attack (TIA), and cerebral infarction without residual deficits: Secondary | ICD-10-CM | POA: Insufficient documentation

## 2018-07-10 DIAGNOSIS — E1140 Type 2 diabetes mellitus with diabetic neuropathy, unspecified: Secondary | ICD-10-CM | POA: Insufficient documentation

## 2018-07-10 DIAGNOSIS — G4730 Sleep apnea, unspecified: Secondary | ICD-10-CM | POA: Insufficient documentation

## 2018-07-10 DIAGNOSIS — K5730 Diverticulosis of large intestine without perforation or abscess without bleeding: Secondary | ICD-10-CM | POA: Diagnosis not present

## 2018-07-10 DIAGNOSIS — Z6831 Body mass index (BMI) 31.0-31.9, adult: Secondary | ICD-10-CM | POA: Diagnosis not present

## 2018-07-10 DIAGNOSIS — N189 Chronic kidney disease, unspecified: Secondary | ICD-10-CM | POA: Insufficient documentation

## 2018-07-10 DIAGNOSIS — I10 Essential (primary) hypertension: Secondary | ICD-10-CM | POA: Diagnosis not present

## 2018-07-10 DIAGNOSIS — E1122 Type 2 diabetes mellitus with diabetic chronic kidney disease: Secondary | ICD-10-CM | POA: Insufficient documentation

## 2018-07-10 DIAGNOSIS — K219 Gastro-esophageal reflux disease without esophagitis: Secondary | ICD-10-CM | POA: Diagnosis not present

## 2018-07-10 DIAGNOSIS — K5790 Diverticulosis of intestine, part unspecified, without perforation or abscess without bleeding: Secondary | ICD-10-CM | POA: Diagnosis not present

## 2018-07-10 DIAGNOSIS — F418 Other specified anxiety disorders: Secondary | ICD-10-CM | POA: Diagnosis not present

## 2018-07-10 DIAGNOSIS — D649 Anemia, unspecified: Secondary | ICD-10-CM | POA: Diagnosis not present

## 2018-07-10 DIAGNOSIS — Z538 Procedure and treatment not carried out for other reasons: Secondary | ICD-10-CM | POA: Diagnosis not present

## 2018-07-10 DIAGNOSIS — I129 Hypertensive chronic kidney disease with stage 1 through stage 4 chronic kidney disease, or unspecified chronic kidney disease: Secondary | ICD-10-CM | POA: Diagnosis not present

## 2018-07-10 HISTORY — DX: Atherosclerosis of aorta: I70.0

## 2018-07-10 HISTORY — DX: Encephalopathy, unspecified: G93.40

## 2018-07-10 HISTORY — DX: Anemia, unspecified: D64.9

## 2018-07-10 HISTORY — DX: Atherosclerotic heart disease of native coronary artery without angina pectoris: I25.10

## 2018-07-10 HISTORY — DX: Morbid (severe) obesity due to excess calories: E66.01

## 2018-07-10 HISTORY — PX: COLONOSCOPY WITH PROPOFOL: SHX5780

## 2018-07-10 LAB — GLUCOSE, CAPILLARY
GLUCOSE-CAPILLARY: 100 mg/dL — AB (ref 70–99)
Glucose-Capillary: 105 mg/dL — ABNORMAL HIGH (ref 70–99)

## 2018-07-10 SURGERY — COLONOSCOPY WITH PROPOFOL
Anesthesia: General

## 2018-07-10 MED ORDER — PROPOFOL 10 MG/ML IV BOLUS
INTRAVENOUS | Status: DC | PRN
  Administered 2018-07-10: 100 mg via INTRAVENOUS

## 2018-07-10 MED ORDER — SODIUM CHLORIDE 0.9 % IV SOLN
INTRAVENOUS | Status: DC
  Administered 2018-07-10: 1000 mL via INTRAVENOUS

## 2018-07-10 MED ORDER — PROPOFOL 500 MG/50ML IV EMUL
INTRAVENOUS | Status: DC | PRN
  Administered 2018-07-10: 100 ug/kg/min via INTRAVENOUS

## 2018-07-10 MED ORDER — PROPOFOL 500 MG/50ML IV EMUL
INTRAVENOUS | Status: AC
  Filled 2018-07-10: qty 50

## 2018-07-10 NOTE — Anesthesia Postprocedure Evaluation (Signed)
Anesthesia Post Note  Patient: Patrick Turner  Procedure(s) Performed: COLONOSCOPY WITH PROPOFOL (N/A )  Patient location during evaluation: Endoscopy Anesthesia Type: General Level of consciousness: awake and alert and oriented Pain management: satisfactory to patient Vital Signs Assessment: post-procedure vital signs reviewed and stable Respiratory status: spontaneous breathing and respiratory function stable Cardiovascular status: blood pressure returned to baseline and stable Postop Assessment: no headache, no backache, patient able to bend at knees, no apparent nausea or vomiting, adequate PO intake and able to ambulate Anesthetic complications: no     Last Vitals:  Vitals:   07/10/18 0636 07/10/18 0753  BP: (!) 161/59 114/62  Pulse: 64 65  Resp: 20 12  Temp: (!) 35.7 C (!) 36.2 C  SpO2: 100% 98%    Last Pain:  Vitals:   07/10/18 0753  TempSrc: Tympanic  PainSc: 0-No pain                 Precious Haws Svea Pusch

## 2018-07-10 NOTE — H&P (Signed)
Outpatient short stay form Pre-procedure 07/10/2018 7:31 AM Patrick Sails MD  Primary Physician: Dr. Frazier Richards  Reason for visit: Colonoscopy  History of present illness: Patient is a 72 year old male presenting today for colon cancer screening.  His last colonoscopy was 04/29/2008 this showing only some diverticulosis and some erythema in the rectum.  Does have a history of chronic intermittent loose stools.  Biopsies at that time were negative for pathology.  She has a history of taking some Plavix however denied taking it currently and does not remember last time he did take it but it has been sometime this if so.  He does take a 81 mg aspirin that has been held for at least 5 days.  Takes no other aspirin products or blood thinner.    Current Facility-Administered Medications:  .  0.9 %  sodium chloride infusion, , Intravenous, Continuous, Patrick Sails, MD, Last Rate: 20 mL/hr at 07/10/18 0649, 1,000 mL at 07/10/18 6967  Medications Prior to Admission  Medication Sig Dispense Refill Last Dose  . aspirin 81 MG tablet Take 1 tablet (81 mg total) by mouth daily.   Past Week at Unknown time  . atorvastatin (LIPITOR) 80 MG tablet Take 80 mg by mouth at bedtime.   Past Week at Unknown time  . clonazePAM (KLONOPIN) 1 MG tablet Take 0.5 mg by mouth at bedtime.  2 Past Week at Unknown time  . gabapentin (NEURONTIN) 100 MG capsule Take 2 capsules (200 mg total) by mouth 3 (three) times daily. 180 capsule 1 Past Week at Unknown time  . Glucose Blood (ACCU-CHEK AVIVA PLUS VI) by In Vitro route.   Past Week at Unknown time  . LEVEMIR 100 UNIT/ML injection Inject 10 Units into the skin at bedtime.   3 Past Week at Unknown time  . lisinopril-hydrochlorothiazide (PRINZIDE,ZESTORETIC) 20-12.5 MG per tablet Take 1 tablet by mouth daily.    Past Week at Unknown time  . meclizine (ANTIVERT) 25 MG tablet Take 1 tablet (25 mg total) by mouth 3 (three) times daily as needed for dizziness. 30  tablet 0 Past Week at Unknown time  . metFORMIN (GLUCOPHAGE-XR) 500 MG 24 hr tablet Take 1,000 mg by mouth 2 (two) times daily.   Past Week at Unknown time  . Omega-3 Fatty Acids (FISH OIL) 1000 MG CAPS Take by mouth.   Past Week at Unknown time  . omeprazole (PRILOSEC) 40 MG capsule Take 40 mg by mouth every morning.   Past Week at Unknown time  . PARoxetine (PAXIL) 40 MG tablet Take 40 mg by mouth at bedtime.   Past Week at Unknown time  . pioglitazone (ACTOS) 15 MG tablet Take 15 mg by mouth daily.   Past Week at Unknown time  . QUEtiapine (SEROQUEL) 100 MG tablet Take 100 mg by mouth at bedtime.   Past Week at Unknown time  . topiramate (TOPAMAX) 100 MG tablet Take 100 mg by mouth 2 (two) times daily.   Past Week at Unknown time  . vitamin B-12 (CYANOCOBALAMIN) 1000 MCG tablet Take 1,000 mcg by mouth daily.   Past Week at Unknown time     Allergies  Allergen Reactions  . Penicillins Anaphylaxis    Has patient had a PCN reaction causing immediate rash, facial/tongue/throat swelling, SOB or lightheadedness with hypotension: Yes Has patient had a PCN reaction causing severe rash involving mucus membranes or skin necrosis: No Has patient had a PCN reaction that required hospitalization Yes Has patient had a PCN  reaction occurring within the last 10 years: No If all of the above answers are "NO", then may proceed with Cephalosporin use.  . Tavist [Clemastine] Swelling     Past Medical History:  Diagnosis Date  . Altered mental status   . Anemia   . Anxiety   . Aortic atherosclerosis ([place])   . Arthritis   . Coronary artery disease   . CRD (chronic renal disease)   . Depression   . Diabetes mellitus without complication ([place])   . Encephalopathy acute   . GERD (gastroesophageal reflux disease)   . Hypercholesteremia   . Hypertension   . Neuropathy   . Severe obesity ([place])   . Sleep apnea   . Sleep terror    per patient this year per patient     Review of systems:       Physical Exam    Heart and lungs: Regular rate and rhythm without rub or gallop, lungs are bilaterally clear.    HEENT: Normocephalic atraumatic eyes are anicteric    Other:    Pertinant exam for procedure: Soft nontender nondistended bowel sounds positive normoactive    Planned proceedures: Colonoscopy and indicated procedures. I have discussed the risks benefits and complications of procedures to include not limited to bleeding, infection, perforation and the risk of sedation and the patient wishes to proceed.    Patrick Sails, MD Gastroenterology 07/10/2018  7:31 AM

## 2018-07-10 NOTE — Op Note (Addendum)
[facility]enterology Patient Name: Patrick Turner Procedure Date: 07/10/2018 7:19 AM MRN: [id number] Account #: [id number] Date of Birth: 1946-08-25 Admit Type: Outpatient Age: 72 Room: [facility] ENDO ROOM 2 Gender: Male Note Status: Finalized Procedure:            Colonoscopy Indications:          Screening for colorectal malignant neoplasm Providers:            Lollie Sails, MD Referring MD:         Ocie Cornfield. Ouida Sills MD, MD (Referring MD) Medicines:            Monitored Anesthesia Care Complications:        No immediate complications. Procedure:            Pre-Anesthesia Assessment:                       - ASA Grade Assessment: III - A patient with severe                        systemic disease.                       After obtaining informed consent, the colonoscope was                        passed under direct vision. Throughout the procedure,                        the patient's blood pressure, pulse, and oxygen                        saturations were monitored continuously. The                        Colonoscope was introduced through the anus with the                        intention of advancing to the cecum. The scope was                        advanced to the sigmoid colon before the procedure was                        aborted. Medications were given. The colonoscopy was                        unusually difficult due to poor bowel prep with stool                        present. The patient tolerated the procedure well. The                        quality of the bowel preparation was poor. Findings:      Multiple medium-mouthed diverticula were found in the sigmoid colon.      A large amount of liquid solid stool was found in the recto-sigmoid       colon, precluding visualization.      The digital rectal exam findings include prostate nodules. Impression:           - Preparation of the  colon was poor.                       - Diverticulosis  in the sigmoid colon.                       - Stool in the recto-sigmoid colon.                       - No specimens collected. Recommendation:       - Discharge patient to home.                       - Resume regular diet.                       - Patient will need repreparation and rescheduling Procedure Code(s):    --- Professional ---                       [phone], 53, Colonoscopy, flexible; diagnostic, including                        collection of specimen(s) by brushing or washing, when                        performed (separate procedure) CPT copyright 2018 American Medical Association. All rights reserved. The codes documented in this report are preliminary and upon coder review may  be revised to meet current compliance requirements. Lollie Sails, MD 07/10/2018 7:53:23 AM This report has been signed electronically. Number of Addenda: 0 Note Initiated On: 07/10/2018 7:19 AM Total Procedure Duration: 0 hours 5 minutes 4 seconds       [facility]

## 2018-07-10 NOTE — Anesthesia Preprocedure Evaluation (Signed)
Anesthesia Evaluation  Patient identified by MRN, date of birth, ID band Patient awake    Reviewed: Allergy & Precautions, H&P , NPO status , Patient's Chart, lab work & pertinent test results  History of Anesthesia Complications Negative for: history of anesthetic complications  Airway Mallampati: III  TM Distance: <3 FB Neck ROM: limited    Dental  (+) Chipped, Poor Dentition   Pulmonary neg shortness of breath, sleep apnea ,           Cardiovascular Exercise Tolerance: Good hypertension, (-) angina+ CAD  (-) Past MI      Neuro/Psych PSYCHIATRIC DISORDERS TIA   GI/Hepatic Neg liver ROS, GERD  Medicated and Controlled,  Endo/Other  diabetes, Type 2  Renal/GU CRFRenal disease  negative genitourinary   Musculoskeletal  (+) Arthritis ,   Abdominal   Peds  Hematology negative hematology ROS (+)   Anesthesia Other Findings Past Medical History: No date: Altered mental status No date: Anemia No date: Anxiety No date: Aortic atherosclerosis (HCC) No date: Arthritis No date: Coronary artery disease No date: CRD (chronic renal disease) No date: Depression No date: Diabetes mellitus without complication (HCC) No date: Encephalopathy acute No date: GERD (gastroesophageal reflux disease) No date: Hypercholesteremia No date: Hypertension No date: Neuropathy No date: Severe obesity (HCC) No date: Sleep apnea No date: Sleep terror     Comment:  per patient this year per patient   Past Surgical History: No date: APPENDECTOMY No date: CIRCUMCISION No date: COLONOSCOPY No date: ESOPHAGOGASTRODUODENOSCOPY No date: JOINT REPLACEMENT 06/27/2016: KNEE ARTHROPLASTY; Right     Comment:  Procedure: COMPUTER ASSISTED TOTAL KNEE ARTHROPLASTY;                Surgeon: Dereck Leep, MD;  Location: [facility] ORS;                Service: Orthopedics;  Laterality: Right; No date: KNEE ARTHROSCOPY; Right No date:  TONSILLECTOMY  BMI    Body Mass Index:  31.62 kg/m      Reproductive/Obstetrics negative OB ROS                             Anesthesia Physical Anesthesia Plan  ASA: III  Anesthesia Plan: General   Post-op Pain Management:    Induction: Intravenous  PONV Risk Score and Plan: Propofol infusion and TIVA  Airway Management Planned: Natural Airway and Nasal Cannula  Additional Equipment:   Intra-op Plan:   Post-operative Plan:   Informed Consent: I have reviewed the patients History and Physical, chart, labs and discussed the procedure including the risks, benefits and alternatives for the proposed anesthesia with the patient or authorized representative who has indicated his/her understanding and acceptance.   Dental Advisory Given  Plan Discussed with: Anesthesiologist, CRNA and Surgeon  Anesthesia Plan Comments: (Patient consented for risks of anesthesia including but not limited to:  - adverse reactions to medications - risk of intubation if required - damage to teeth, lips or other oral mucosa - sore throat or hoarseness - Damage to heart, brain, lungs or loss of life  Patient voiced understanding.)        Anesthesia Quick Evaluation

## 2018-07-10 NOTE — Anesthesia Post-op Follow-up Note (Signed)
Anesthesia QCDR form completed.        

## 2018-07-10 NOTE — Anesthesia Postprocedure Evaluation (Signed)
Anesthesia Post Note  Patient: Patrick Turner  Procedure(s) Performed: COLONOSCOPY WITH PROPOFOL (N/A )  Patient location during evaluation: Endoscopy Anesthesia Type: General Level of consciousness: awake and alert Pain management: pain level controlled Vital Signs Assessment: post-procedure vital signs reviewed and stable Respiratory status: spontaneous breathing, nonlabored ventilation, respiratory function stable and patient connected to nasal cannula oxygen Cardiovascular status: blood pressure returned to baseline and stable Postop Assessment: no apparent nausea or vomiting Anesthetic complications: no     Last Vitals:  Vitals:   07/10/18 0813 07/10/18 0823  BP: (!) 142/59 (!) 151/92  Pulse: 66 66  Resp: 15 19  Temp:    SpO2: 99% 100%    Last Pain:  Vitals:   07/10/18 0823  TempSrc:   PainSc: 0-No pain                 Precious Haws Piscitello

## 2018-07-10 NOTE — Transfer of Care (Signed)
Immediate Anesthesia Transfer of Care Note  Patient: Patrick Turner  Procedure(s) Performed: COLONOSCOPY WITH PROPOFOL (N/A )  Patient Location: PACU and Endoscopy Unit  Anesthesia Type:General  Level of Consciousness: awake, alert , oriented and patient cooperative  Airway & Oxygen Therapy: Patient Spontanous Breathing  Post-op Assessment: Report given to RN  Post vital signs: Reviewed and stable  Last Vitals:  Vitals Value Taken Time  BP 114/62 07/10/2018  7:53 AM  Temp 36.2 C 07/10/2018  7:53 AM  Pulse 65 07/10/2018  7:53 AM  Resp 12 07/10/2018  7:53 AM  SpO2 98 % 07/10/2018  7:53 AM    Last Pain:  Vitals:   07/10/18 0753  TempSrc: Tympanic  PainSc: 0-No pain         Complications: No apparent anesthesia complications

## 2018-07-11 ENCOUNTER — Encounter: Payer: Self-pay | Admitting: Gastroenterology

## 2018-07-31 DIAGNOSIS — I63233 Cerebral infarction due to unspecified occlusion or stenosis of bilateral carotid arteries: Secondary | ICD-10-CM | POA: Diagnosis not present

## 2018-07-31 DIAGNOSIS — R55 Syncope and collapse: Secondary | ICD-10-CM | POA: Diagnosis not present

## 2018-09-03 DIAGNOSIS — I700 Atherosclerosis of aorta: Secondary | ICD-10-CM | POA: Diagnosis not present

## 2018-09-03 DIAGNOSIS — E1142 Type 2 diabetes mellitus with diabetic polyneuropathy: Secondary | ICD-10-CM | POA: Diagnosis not present

## 2018-09-03 DIAGNOSIS — I739 Peripheral vascular disease, unspecified: Secondary | ICD-10-CM | POA: Diagnosis not present

## 2018-09-11 DIAGNOSIS — Z794 Long term (current) use of insulin: Secondary | ICD-10-CM | POA: Diagnosis not present

## 2018-09-11 DIAGNOSIS — E1122 Type 2 diabetes mellitus with diabetic chronic kidney disease: Secondary | ICD-10-CM | POA: Diagnosis not present

## 2018-09-11 DIAGNOSIS — E1165 Type 2 diabetes mellitus with hyperglycemia: Secondary | ICD-10-CM | POA: Diagnosis not present

## 2018-09-11 DIAGNOSIS — E1142 Type 2 diabetes mellitus with diabetic polyneuropathy: Secondary | ICD-10-CM | POA: Diagnosis not present

## 2018-09-11 DIAGNOSIS — R42 Dizziness and giddiness: Secondary | ICD-10-CM | POA: Diagnosis not present

## 2018-09-11 DIAGNOSIS — N183 Chronic kidney disease, stage 3 (moderate): Secondary | ICD-10-CM | POA: Diagnosis not present

## 2018-09-11 DIAGNOSIS — E1159 Type 2 diabetes mellitus with other circulatory complications: Secondary | ICD-10-CM | POA: Diagnosis not present

## 2018-09-19 DIAGNOSIS — F323 Major depressive disorder, single episode, severe with psychotic features: Secondary | ICD-10-CM | POA: Diagnosis not present

## 2018-10-24 DIAGNOSIS — F323 Major depressive disorder, single episode, severe with psychotic features: Secondary | ICD-10-CM | POA: Diagnosis not present

## 2018-11-21 DIAGNOSIS — E1169 Type 2 diabetes mellitus with other specified complication: Secondary | ICD-10-CM | POA: Diagnosis not present

## 2018-11-21 DIAGNOSIS — G4733 Obstructive sleep apnea (adult) (pediatric): Secondary | ICD-10-CM | POA: Diagnosis not present

## 2018-11-21 DIAGNOSIS — E1122 Type 2 diabetes mellitus with diabetic chronic kidney disease: Secondary | ICD-10-CM | POA: Diagnosis not present

## 2018-11-21 DIAGNOSIS — G2119 Other drug induced secondary parkinsonism: Secondary | ICD-10-CM | POA: Diagnosis not present

## 2018-11-21 DIAGNOSIS — Z0000 Encounter for general adult medical examination without abnormal findings: Secondary | ICD-10-CM | POA: Diagnosis not present

## 2018-11-21 DIAGNOSIS — E1142 Type 2 diabetes mellitus with diabetic polyneuropathy: Secondary | ICD-10-CM | POA: Diagnosis not present

## 2018-11-21 DIAGNOSIS — I700 Atherosclerosis of aorta: Secondary | ICD-10-CM | POA: Diagnosis not present

## 2018-11-21 DIAGNOSIS — I739 Peripheral vascular disease, unspecified: Secondary | ICD-10-CM | POA: Diagnosis not present

## 2018-11-21 DIAGNOSIS — I10 Essential (primary) hypertension: Secondary | ICD-10-CM | POA: Diagnosis not present

## 2018-11-21 DIAGNOSIS — Z79899 Other long term (current) drug therapy: Secondary | ICD-10-CM | POA: Diagnosis not present

## 2018-12-05 DIAGNOSIS — Z125 Encounter for screening for malignant neoplasm of prostate: Secondary | ICD-10-CM | POA: Diagnosis not present

## 2018-12-05 DIAGNOSIS — Z0000 Encounter for general adult medical examination without abnormal findings: Secondary | ICD-10-CM | POA: Diagnosis not present

## 2018-12-05 DIAGNOSIS — N183 Chronic kidney disease, stage 3 (moderate): Secondary | ICD-10-CM | POA: Diagnosis not present

## 2018-12-05 DIAGNOSIS — E1122 Type 2 diabetes mellitus with diabetic chronic kidney disease: Secondary | ICD-10-CM | POA: Diagnosis not present

## 2018-12-05 DIAGNOSIS — E1142 Type 2 diabetes mellitus with diabetic polyneuropathy: Secondary | ICD-10-CM | POA: Diagnosis not present

## 2018-12-05 DIAGNOSIS — I739 Peripheral vascular disease, unspecified: Secondary | ICD-10-CM | POA: Diagnosis not present

## 2018-12-12 DIAGNOSIS — E1165 Type 2 diabetes mellitus with hyperglycemia: Secondary | ICD-10-CM | POA: Diagnosis not present

## 2018-12-12 DIAGNOSIS — Z794 Long term (current) use of insulin: Secondary | ICD-10-CM | POA: Diagnosis not present

## 2018-12-12 DIAGNOSIS — E1142 Type 2 diabetes mellitus with diabetic polyneuropathy: Secondary | ICD-10-CM | POA: Diagnosis not present

## 2018-12-12 DIAGNOSIS — E1159 Type 2 diabetes mellitus with other circulatory complications: Secondary | ICD-10-CM | POA: Diagnosis not present

## 2018-12-12 DIAGNOSIS — E1122 Type 2 diabetes mellitus with diabetic chronic kidney disease: Secondary | ICD-10-CM | POA: Diagnosis not present

## 2018-12-12 DIAGNOSIS — N183 Chronic kidney disease, stage 3 (moderate): Secondary | ICD-10-CM | POA: Diagnosis not present

## 2018-12-24 ENCOUNTER — Emergency Department: Payer: Medicare HMO

## 2018-12-24 ENCOUNTER — Encounter: Payer: Self-pay | Admitting: *Deleted

## 2018-12-24 ENCOUNTER — Observation Stay
Admission: EM | Admit: 2018-12-24 | Discharge: 2018-12-25 | Disposition: A | Discharge status: Home / Self Care | Payer: Medicare HMO | Attending: Family Medicine | Admitting: Family Medicine

## 2018-12-24 ENCOUNTER — Other Ambulatory Visit: Payer: Self-pay

## 2018-12-24 DIAGNOSIS — K219 Gastro-esophageal reflux disease without esophagitis: Secondary | ICD-10-CM | POA: Diagnosis not present

## 2018-12-24 DIAGNOSIS — R4182 Altered mental status, unspecified: Secondary | ICD-10-CM | POA: Diagnosis not present

## 2018-12-24 DIAGNOSIS — R55 Syncope and collapse: Secondary | ICD-10-CM | POA: Diagnosis not present

## 2018-12-24 DIAGNOSIS — R404 Transient alteration of awareness: Secondary | ICD-10-CM | POA: Diagnosis not present

## 2018-12-24 DIAGNOSIS — F39 Unspecified mood [affective] disorder: Secondary | ICD-10-CM | POA: Diagnosis not present

## 2018-12-24 DIAGNOSIS — E1165 Type 2 diabetes mellitus with hyperglycemia: Secondary | ICD-10-CM | POA: Diagnosis not present

## 2018-12-24 DIAGNOSIS — E785 Hyperlipidemia, unspecified: Secondary | ICD-10-CM | POA: Insufficient documentation

## 2018-12-24 DIAGNOSIS — E1122 Type 2 diabetes mellitus with diabetic chronic kidney disease: Secondary | ICD-10-CM | POA: Insufficient documentation

## 2018-12-24 DIAGNOSIS — R296 Repeated falls: Secondary | ICD-10-CM | POA: Insufficient documentation

## 2018-12-24 DIAGNOSIS — D631 Anemia in chronic kidney disease: Secondary | ICD-10-CM | POA: Diagnosis not present

## 2018-12-24 DIAGNOSIS — G2119 Other drug induced secondary parkinsonism: Secondary | ICD-10-CM | POA: Insufficient documentation

## 2018-12-24 DIAGNOSIS — Z888 Allergy status to other drugs, medicaments and biological substances status: Secondary | ICD-10-CM | POA: Insufficient documentation

## 2018-12-24 DIAGNOSIS — N189 Chronic kidney disease, unspecified: Secondary | ICD-10-CM | POA: Insufficient documentation

## 2018-12-24 DIAGNOSIS — G9340 Encephalopathy, unspecified: Secondary | ICD-10-CM | POA: Diagnosis not present

## 2018-12-24 DIAGNOSIS — G4733 Obstructive sleep apnea (adult) (pediatric): Secondary | ICD-10-CM | POA: Diagnosis not present

## 2018-12-24 DIAGNOSIS — F319 Bipolar disorder, unspecified: Secondary | ICD-10-CM | POA: Insufficient documentation

## 2018-12-24 DIAGNOSIS — F419 Anxiety disorder, unspecified: Secondary | ICD-10-CM | POA: Diagnosis not present

## 2018-12-24 DIAGNOSIS — Z794 Long term (current) use of insulin: Secondary | ICD-10-CM | POA: Insufficient documentation

## 2018-12-24 DIAGNOSIS — Z8249 Family history of ischemic heart disease and other diseases of the circulatory system: Secondary | ICD-10-CM | POA: Diagnosis not present

## 2018-12-24 DIAGNOSIS — R2689 Other abnormalities of gait and mobility: Secondary | ICD-10-CM | POA: Insufficient documentation

## 2018-12-24 DIAGNOSIS — R410 Disorientation, unspecified: Secondary | ICD-10-CM | POA: Diagnosis not present

## 2018-12-24 DIAGNOSIS — I10 Essential (primary) hypertension: Secondary | ICD-10-CM | POA: Diagnosis not present

## 2018-12-24 DIAGNOSIS — Z79899 Other long term (current) drug therapy: Secondary | ICD-10-CM | POA: Insufficient documentation

## 2018-12-24 DIAGNOSIS — I2510 Atherosclerotic heart disease of native coronary artery without angina pectoris: Secondary | ICD-10-CM | POA: Insufficient documentation

## 2018-12-24 DIAGNOSIS — Z880 Allergy status to penicillin: Secondary | ICD-10-CM | POA: Insufficient documentation

## 2018-12-24 DIAGNOSIS — Z96651 Presence of right artificial knee joint: Secondary | ICD-10-CM | POA: Diagnosis not present

## 2018-12-24 DIAGNOSIS — E1140 Type 2 diabetes mellitus with diabetic neuropathy, unspecified: Secondary | ICD-10-CM | POA: Diagnosis not present

## 2018-12-24 DIAGNOSIS — Z833 Family history of diabetes mellitus: Secondary | ICD-10-CM | POA: Insufficient documentation

## 2018-12-24 DIAGNOSIS — Z7982 Long term (current) use of aspirin: Secondary | ICD-10-CM | POA: Insufficient documentation

## 2018-12-24 DIAGNOSIS — I129 Hypertensive chronic kidney disease with stage 1 through stage 4 chronic kidney disease, or unspecified chronic kidney disease: Secondary | ICD-10-CM | POA: Diagnosis not present

## 2018-12-24 DIAGNOSIS — E119 Type 2 diabetes mellitus without complications: Secondary | ICD-10-CM | POA: Diagnosis not present

## 2018-12-24 LAB — URINE DRUG SCREEN, QUALITATIVE (ARMC ONLY)
Amphetamines, Ur Screen: NOT DETECTED
Barbiturates, Ur Screen: NOT DETECTED
Benzodiazepine, Ur Scrn: NOT DETECTED
Cannabinoid 50 Ng, Ur ~~LOC~~: NOT DETECTED
Cocaine Metabolite,Ur ~~LOC~~: NOT DETECTED
MDMA (Ecstasy)Ur Screen: NOT DETECTED
Methadone Scn, Ur: NOT DETECTED
Opiate, Ur Screen: NOT DETECTED
Phencyclidine (PCP) Ur S: NOT DETECTED
Tricyclic, Ur Screen: NOT DETECTED

## 2018-12-24 LAB — COMPREHENSIVE METABOLIC PANEL
ALT: 14 U/L (ref 0–44)
AST: 20 U/L (ref 15–41)
Albumin: 3.6 g/dL (ref 3.5–5.0)
Alkaline Phosphatase: 75 U/L (ref 38–126)
Anion gap: 6 (ref 5–15)
BUN: 28 mg/dL — ABNORMAL HIGH (ref 8–23)
CO2: 22 mmol/L (ref 22–32)
Calcium: 8.3 mg/dL — ABNORMAL LOW (ref 8.9–10.3)
Chloride: 109 mmol/L (ref 98–111)
Creatinine, Ser: 1.49 mg/dL — ABNORMAL HIGH (ref 0.61–1.24)
GFR calc Af Amer: 53 mL/min — ABNORMAL LOW (ref >60)
GFR calc non Af Amer: 46 mL/min — ABNORMAL LOW (ref >60)
Glucose, Bld: 379 mg/dL — ABNORMAL HIGH (ref 70–99)
Potassium: 4.4 mmol/L (ref 3.5–5.1)
Sodium: 137 mmol/L (ref 135–145)
Total Bilirubin: 0.4 mg/dL (ref 0.3–1.2)
Total Protein: 6.4 g/dL — ABNORMAL LOW (ref 6.5–8.1)

## 2018-12-24 LAB — GLUCOSE, CAPILLARY
Glucose-Capillary: 168 mg/dL — ABNORMAL HIGH (ref 70–99)
Glucose-Capillary: 170 mg/dL — ABNORMAL HIGH (ref 70–99)
Glucose-Capillary: 201 mg/dL — ABNORMAL HIGH (ref 70–99)
Glucose-Capillary: 340 mg/dL — ABNORMAL HIGH (ref 70–99)
Glucose-Capillary: 99 mg/dL (ref 70–99)

## 2018-12-24 LAB — CBC WITH DIFFERENTIAL/PLATELET
Abs Immature Granulocytes: 0.01 10*3/uL (ref 0.00–0.07)
Basophils Absolute: 0 10*3/uL (ref 0.0–0.1)
Basophils Relative: 1 %
Eosinophils Absolute: 0.2 10*3/uL (ref 0.0–0.5)
Eosinophils Relative: 3 %
HCT: 28.5 % — ABNORMAL LOW (ref 39.0–52.0)
Hemoglobin: 8.6 g/dL — ABNORMAL LOW (ref 13.0–17.0)
Immature Granulocytes: 0 %
Lymphocytes Relative: 26 %
Lymphs Abs: 1.6 10*3/uL (ref 0.7–4.0)
MCH: 24.7 pg — ABNORMAL LOW (ref 26.0–34.0)
MCHC: 30.2 g/dL (ref 30.0–36.0)
MCV: 81.9 fL (ref 80.0–100.0)
Monocytes Absolute: 0.6 10*3/uL (ref 0.1–1.0)
Monocytes Relative: 11 %
Neutro Abs: 3.5 10*3/uL (ref 1.7–7.7)
Neutrophils Relative %: 59 %
Platelets: 244 10*3/uL (ref 150–400)
RBC: 3.48 MIL/uL — ABNORMAL LOW (ref 4.22–5.81)
RDW: 16.4 % — ABNORMAL HIGH (ref 11.5–15.5)
WBC: 6 10*3/uL (ref 4.0–10.5)
nRBC: 0 % (ref 0.0–0.2)

## 2018-12-24 LAB — URINALYSIS, COMPLETE (UACMP) WITH MICROSCOPIC
Bacteria, UA: NONE SEEN
Bilirubin Urine: NEGATIVE
Glucose, UA: >=500 mg/dL — AB
Hgb urine dipstick: NEGATIVE
Ketones, ur: NEGATIVE mg/dL
Leukocytes,Ua: NEGATIVE
Nitrite: NEGATIVE
Protein, ur: NEGATIVE mg/dL
Specific Gravity, Urine: 1.016 (ref 1.005–1.030)
pH: 5 (ref 5.0–8.0)

## 2018-12-24 LAB — AMMONIA: Ammonia: 10 umol/L (ref 9–35)

## 2018-12-24 LAB — TSH: TSH: 1.663 u[IU]/mL (ref 0.350–4.500)

## 2018-12-24 LAB — TROPONIN I
Troponin I: 0.03 ng/mL (ref <0.03)
Troponin I: <0.03 ng/mL (ref <0.03)

## 2018-12-24 LAB — BRAIN NATRIURETIC PEPTIDE: B Natriuretic Peptide: 82 pg/mL (ref 0.0–100.0)

## 2018-12-24 MED ORDER — SODIUM CHLORIDE 0.9 % IV SOLN
INTRAVENOUS | Status: AC
  Administered 2018-12-24: 10:00:00 via INTRAVENOUS

## 2018-12-24 MED ORDER — PAROXETINE HCL 20 MG PO TABS
40.0000 mg | ORAL_TABLET | Freq: Every day | ORAL | Status: DC
  Filled 2018-12-24: qty 2

## 2018-12-24 MED ORDER — ONDANSETRON HCL 4 MG PO TABS: 4.0000 mg | ORAL_TABLET | Freq: Four times a day (QID) | ORAL | Status: DC | PRN

## 2018-12-24 MED ORDER — ATORVASTATIN CALCIUM 20 MG PO TABS
80.0000 mg | ORAL_TABLET | Freq: Every day | ORAL | Status: DC
  Administered 2018-12-24: 22:00:00 80 mg via ORAL
  Filled 2018-12-24: qty 4

## 2018-12-24 MED ORDER — ACETAMINOPHEN 650 MG RE SUPP: 650.0000 mg | Freq: Four times a day (QID) | RECTAL | Status: DC | PRN

## 2018-12-24 MED ORDER — HYDROCHLOROTHIAZIDE 12.5 MG PO CAPS
12.5000 mg | ORAL_CAPSULE | Freq: Every day | ORAL | Status: DC
  Administered 2018-12-24 – 2018-12-25 (×2): 12.5 mg via ORAL
  Filled 2018-12-24 (×2): qty 1

## 2018-12-24 MED ORDER — ACETAMINOPHEN 325 MG PO TABS: 650.0000 mg | ORAL_TABLET | Freq: Four times a day (QID) | ORAL | Status: DC | PRN

## 2018-12-24 MED ORDER — DIPHENHYDRAMINE HCL 50 MG/ML IJ SOLN: 50.0000 mg | Freq: Once | INTRAMUSCULAR | Status: DC

## 2018-12-24 MED ORDER — ADULT MULTIVITAMIN W/MINERALS CH
1.0000 | ORAL_TABLET | Freq: Every day | ORAL | Status: DC
  Administered 2018-12-24 – 2018-12-25 (×2): 1 via ORAL
  Filled 2018-12-24 (×2): qty 1

## 2018-12-24 MED ORDER — MAGNESIUM HYDROXIDE 400 MG/5ML PO SUSP
30.0000 mL | Freq: Every day | ORAL | Status: DC | PRN
  Filled 2018-12-24: qty 30

## 2018-12-24 MED ORDER — METHYLPREDNISOLONE SODIUM SUCC 125 MG IJ SOLR: 125.0000 mg | Freq: Once | INTRAMUSCULAR | Status: DC

## 2018-12-24 MED ORDER — INSULIN ASPART 100 UNIT/ML ~~LOC~~ SOLN
0.0000 [IU] | Freq: Three times a day (TID) | SUBCUTANEOUS | Status: DC
  Administered 2018-12-24: 13:00:00 7 [IU] via SUBCUTANEOUS
  Administered 2018-12-25: 09:00:00 3 [IU] via SUBCUTANEOUS
  Filled 2018-12-24 (×2): qty 1

## 2018-12-24 MED ORDER — CLONAZEPAM 0.5 MG PO TABS
0.5000 mg | ORAL_TABLET | Freq: Every day | ORAL | Status: DC
  Administered 2018-12-24: 0.5 mg via ORAL
  Filled 2018-12-24: qty 1

## 2018-12-24 MED ORDER — PIOGLITAZONE HCL 15 MG PO TABS
15.0000 mg | ORAL_TABLET | Freq: Every day | ORAL | Status: DC
  Administered 2018-12-24 – 2018-12-25 (×2): 15 mg via ORAL
  Filled 2018-12-24 (×2): qty 1

## 2018-12-24 MED ORDER — QUETIAPINE FUMARATE 25 MG PO TABS
100.0000 mg | ORAL_TABLET | Freq: Every day | ORAL | Status: DC
  Administered 2018-12-24: 22:00:00 100 mg via ORAL
  Filled 2018-12-24: qty 4

## 2018-12-24 MED ORDER — ASPIRIN EC 81 MG PO TBEC
81.0000 mg | DELAYED_RELEASE_TABLET | Freq: Every day | ORAL | Status: DC
  Administered 2018-12-24 – 2018-12-25 (×2): 81 mg via ORAL
  Filled 2018-12-24 (×2): qty 1

## 2018-12-24 MED ORDER — ENOXAPARIN SODIUM 40 MG/0.4ML ~~LOC~~ SOLN
40.0000 mg | SUBCUTANEOUS | Status: DC
  Administered 2018-12-24 – 2018-12-25 (×2): 40 mg via SUBCUTANEOUS
  Filled 2018-12-24 (×2): qty 0.4

## 2018-12-24 MED ORDER — ONDANSETRON HCL 4 MG/2ML IJ SOLN: 4.0000 mg | Freq: Once | INTRAMUSCULAR | Status: DC

## 2018-12-24 MED ORDER — INSULIN ASPART 100 UNIT/ML ~~LOC~~ SOLN: 0.0000 [IU] | Freq: Every day | SUBCUTANEOUS | Status: DC

## 2018-12-24 MED ORDER — OMEGA-3-ACID ETHYL ESTERS 1 G PO CAPS
1.0000 g | ORAL_CAPSULE | Freq: Every day | ORAL | Status: DC
  Administered 2018-12-24 – 2018-12-25 (×2): 1 g via ORAL
  Filled 2018-12-24 (×2): qty 1

## 2018-12-24 MED ORDER — LISINOPRIL 20 MG PO TABS
20.0000 mg | ORAL_TABLET | Freq: Every day | ORAL | Status: DC
  Administered 2018-12-24 – 2018-12-25 (×2): 20 mg via ORAL
  Filled 2018-12-24 (×2): qty 1

## 2018-12-24 MED ORDER — QUETIAPINE FUMARATE 25 MG PO TABS: 100.0000 mg | ORAL_TABLET | Freq: Every day | ORAL | Status: DC

## 2018-12-24 MED ORDER — GABAPENTIN 100 MG PO CAPS
200.0000 mg | ORAL_CAPSULE | Freq: Three times a day (TID) | ORAL | Status: DC
  Administered 2018-12-25: 09:00:00 200 mg via ORAL
  Filled 2018-12-24: qty 2

## 2018-12-24 MED ORDER — PANTOPRAZOLE SODIUM 40 MG PO TBEC
40.0000 mg | DELAYED_RELEASE_TABLET | Freq: Every day | ORAL | Status: DC
  Administered 2018-12-24 – 2018-12-25 (×2): 40 mg via ORAL
  Filled 2018-12-24 (×2): qty 1

## 2018-12-24 MED ORDER — PAROXETINE HCL 20 MG PO TABS
40.0000 mg | ORAL_TABLET | Freq: Every day | ORAL | Status: DC
  Administered 2018-12-24 – 2018-12-25 (×2): 40 mg via ORAL
  Filled 2018-12-24 (×2): qty 2

## 2018-12-24 MED ORDER — LISINOPRIL-HYDROCHLOROTHIAZIDE 20-12.5 MG PO TABS: 1.0000 | ORAL_TABLET | Freq: Every day | ORAL | Status: DC

## 2018-12-24 MED ORDER — MECLIZINE HCL 25 MG PO TABS
25.0000 mg | ORAL_TABLET | Freq: Three times a day (TID) | ORAL | Status: DC | PRN
  Administered 2018-12-24: 25 mg via ORAL
  Filled 2018-12-24 (×2): qty 1

## 2018-12-24 MED ORDER — INSULIN DETEMIR 100 UNIT/ML ~~LOC~~ SOLN
12.0000 [IU] | Freq: Every day | SUBCUTANEOUS | Status: DC
  Administered 2018-12-24 – 2018-12-25 (×2): 12 [IU] via SUBCUTANEOUS
  Filled 2018-12-24 (×2): qty 0.12

## 2018-12-24 MED ORDER — SODIUM CHLORIDE 0.9 % IV BOLUS
1000.0000 mL | Freq: Once | INTRAVENOUS | Status: AC
  Administered 2018-12-24: 1000 mL via INTRAVENOUS

## 2018-12-24 MED ORDER — FAMOTIDINE IN NACL 20-0.9 MG/50ML-% IV SOLN: 20.0000 mg | Freq: Once | INTRAVENOUS | Status: DC

## 2018-12-24 MED ORDER — VITAMIN B-12 1000 MCG PO TABS
1000.0000 ug | ORAL_TABLET | Freq: Every day | ORAL | Status: DC
  Administered 2018-12-24 – 2018-12-25 (×2): 1000 ug via ORAL
  Filled 2018-12-24 (×2): qty 1

## 2018-12-24 MED ORDER — ONDANSETRON HCL 4 MG/2ML IJ SOLN: 4.0000 mg | Freq: Four times a day (QID) | INTRAMUSCULAR | Status: DC | PRN

## 2018-12-24 NOTE — H&P (Addendum)
[facility] [place] NAME: Patrick Turner    MR#:  [id number]  DATE OF BIRTH:  07/02/1946  DATE OF ADMISSION:  12/24/2018  PRIMARY CARE PHYSICIAN: Kirk Ruths, MD   REQUESTING/REFERRING PHYSICIAN:   CHIEF COMPLAINT:   Chief Complaint  Patient presents with  . Hyperglycemia    HISTORY OF PRESENT ILLNESS: Patrick Turner  is a 73 y.o. male with a known history per below which includes organic brain disease, drug-induced Parkinson's, bipolar illness, multiple hospitals with documentation for acute altered mental status/encephalopathy, discharged from our hospital at the end of last year asked extensive neurovascular work-up which was unimpressive, presents to the emergency room with altered mental status, acute confusion, noted abnormal gait, ?  Near fall, dizziness while at work, patient was brought to the emergency room for further evaluation/care, ER work-up was largely unimpressive, CT head/MRI of the brain negative for any acute process, UA negative, patient evaluated in the emergency room, no apparent distress, resting comfortably in bed, is a poor historian, but denies pain, no other family available, patient is now been admitted for acute recurrent toxic metabolic encephalopathy suspect due to organic brain disease compounded by polypharmacy with history of bipolar illness.  PAST MEDICAL HISTORY:   Past Medical History:  Diagnosis Date  . Altered mental status   . Anemia   . Anxiety   . Aortic atherosclerosis ([place])   . Arthritis   . Coronary artery disease   . CRD (chronic renal disease)   . Depression   . Diabetes mellitus without complication ([place])   . Encephalopathy acute   . GERD (gastroesophageal reflux disease)   . Hypercholesteremia   . Hypertension   . Neuropathy   . Severe obesity ([place])   . Sleep apnea   . Sleep terror    per patient this year per patient     PAST SURGICAL HISTORY:  Past Surgical History:   Procedure Laterality Date  . APPENDECTOMY    . CIRCUMCISION    . COLONOSCOPY    . COLONOSCOPY WITH PROPOFOL N/A 07/10/2018   Procedure: COLONOSCOPY WITH PROPOFOL;  Surgeon: Lollie Sails, MD;  Location: [facility] ENDOSCOPY;  Service: Endoscopy;  Laterality: N/A;  . ESOPHAGOGASTRODUODENOSCOPY    . JOINT REPLACEMENT    . 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 KNEE ARTHROSCOPY Right   . TONSILLECTOMY      SOCIAL HISTORY:  Social History   Tobacco Use  . Smoking status: Never Smoker  . Smokeless tobacco: Never Used  Substance Use Topics  . Alcohol use: Yes    Comment: rare    FAMILY HISTORY:  Family History  Problem Relation Age of Onset  . Diabetes Mother   . Heart disease Mother   . Cancer Father   . COPD Father     DRUG ALLERGIES:  Allergies  Allergen Reactions  . Penicillins Anaphylaxis    Has patient had a PCN reaction causing immediate rash, facial/tongue/throat swelling, SOB or lightheadedness with hypotension: Yes Has patient had a PCN reaction causing severe rash involving mucus membranes or skin necrosis: No Has patient had a PCN reaction that required hospitalization Yes Has patient had a PCN reaction occurring within the last 10 years: No If all of the above answers are "NO", then may proceed with Cephalosporin use.  . Tavist [Clemastine] Swelling  REVIEW OF SYSTEMS: Poor historian due to confusion  CONSTITUTIONAL: No fever, fatigue or weakness.  EYES: No blurred or double vision.  EARS, NOSE, AND THROAT: No tinnitus or ear pain.  RESPIRATORY: No cough, shortness of breath, wheezing or hemoptysis.  CARDIOVASCULAR: No chest pain, orthopnea, edema.  GASTROINTESTINAL: No nausea, vomiting, diarrhea or abdominal pain.  GENITOURINARY: No dysuria, hematuria.  ENDOCRINE: No polyuria, nocturia,  HEMATOLOGY: No  anemia, easy bruising or bleeding SKIN: No rash or lesion. MUSCULOSKELETAL: No joint pain or arthritis.   NEUROLOGIC: No tingling, numbness, weakness. + Confusion, disorientation PSYCHIATRY: No anxiety or depression.   MEDICATIONS AT HOME:  Prior to Admission medications   Medication Sig Start Date End Date Taking? Authorizing Provider  aspirin 81 MG tablet Take 1 tablet (81 mg total) by mouth daily. 02/23/18  Yes Henreitta Leber, MD  atorvastatin (LIPITOR) 80 MG tablet Take 80 mg by mouth at bedtime. 12/14/15  Yes [provider]  clonazePAM (KLONOPIN) 1 MG tablet Take 0.5 mg by mouth at bedtime as needed for anxiety.  12/18/15  Yes [provider]  gabapentin (NEURONTIN) 400 MG capsule Take 400 mg by mouth 2 (two) times daily.   Yes [provider]  LEVEMIR 100 UNIT/ML injection Inject 12 Units into the skin daily.    Yes [provider]  lisinopril-hydrochlorothiazide (PRINZIDE,ZESTORETIC) 20-12.5 MG per tablet Take 1 tablet by mouth daily.    Yes [provider]  metFORMIN (GLUCOPHAGE-XR) 500 MG 24 hr tablet Take 1,000 mg by mouth 2 (two) times daily.   Yes [provider]  Omega-3 Fatty Acids (FISH OIL) 1000 MG CAPS Take 1 capsule by mouth daily.    Yes [provider]  omeprazole (PRILOSEC) 40 MG capsule Take 40 mg by mouth daily.    Yes [provider]  PARoxetine (PAXIL) 40 MG tablet Take 40 mg by mouth daily.    Yes [provider]  pioglitazone (ACTOS) 15 MG tablet Take 15 mg by mouth daily.    Yes [provider]  QUEtiapine (SEROQUEL) 100 MG tablet Take 100 mg by mouth at bedtime. 12/22/15  Yes [provider]  topiramate (TOPAMAX) 100 MG tablet Take 100 mg by mouth 2 (two) times daily.   Yes [provider]  gabapentin (NEURONTIN) 100 MG capsule Take 2 capsules (200 mg total) by mouth 3 (three) times daily. 07/01/18 08/30/18  Henreitta Leber, MD  Glucose Blood (ACCU-CHEK AVIVA  PLUS VI) by In Vitro route.    [provider]  meclizine (ANTIVERT) 25 MG tablet Take 1 tablet (25 mg total) by mouth 3 (three) times daily as needed for dizziness. Patient not taking: Reported on 12/24/2018 07/01/18   Henreitta Leber, MD  vitamin B-12 (CYANOCOBALAMIN) 1000 MCG tablet Take 1,000 mcg by mouth daily.    [provider]      PHYSICAL EXAMINATION:   VITAL SIGNS: Blood pressure (!) 170/64, pulse (!) 58, temperature 97.9 F (36.6 C), temperature source Oral, resp. rate 14, height 5\' 8"  (1.727 m), SpO2 100 %.  GENERAL:  73 y.o.-year-old patient lying in the bed with no acute distress.  Obese, nontoxic-appearing EYES: Pupils equal, round, reactive to light and accommodation. No scleral icterus. Extraocular muscles intact.  HEENT: Head atraumatic, normocephalic. Oropharynx and nasopharynx clear.  NECK:  Supple, no jugular venous distention. No thyroid enlargement, no tenderness.  LUNGS: Normal breath sounds bilaterally, no wheezing, rales,rhonchi or crepitation. No use of accessory muscles of respiration.  CARDIOVASCULAR: S1, S2  normal. No murmurs, rubs, or gallops.  ABDOMEN: Soft, nontender, nondistended. Bowel sounds present. No organomegaly or mass.  EXTREMITIES: No pedal edema, cyanosis, or clubbing.  NEUROLOGIC: Cranial nerves II through XII are intact. MAES. Gait not checked.  PSYCHIATRIC: The patient is awake, alert, oriented x2, confused, disoriented   SKIN: No obvious rash, lesion, or ulcer.   LABORATORY PANEL:   CBC Recent Labs  Lab 12/24/18 0105  WBC 6.0  HGB 8.6*  HCT 28.5*  PLT 244  MCV 81.9  MCH 24.7*  MCHC 30.2  RDW 16.4*  LYMPHSABS 1.6  MONOABS 0.6  EOSABS 0.2  BASOSABS 0.0   ------------------------------------------------------------------------------------------------------------------  Chemistries  Recent Labs  Lab 12/24/18 0105  NA 137  K 4.4  CL 109  CO2 22  GLUCOSE 379*  BUN 28*  CREATININE 1.49*  CALCIUM 8.3*   AST 20  ALT 14  ALKPHOS 75  BILITOT 0.4   ------------------------------------------------------------------------------------------------------------------ CrCl cannot be calculated (Unknown ideal weight.). ------------------------------------------------------------------------------------------------------------------ Recent Labs    12/24/18 0105  TSH 1.663     Coagulation profile No results for input(s): INR, PROTIME in the last 168 hours. ------------------------------------------------------------------------------------------------------------------- No results for input(s): DDIMER in the last 72 hours. -------------------------------------------------------------------------------------------------------------------  Cardiac Enzymes Recent Labs  Lab 12/24/18 0105 12/24/18 0228  TROPONINI 0.03* <0.03   ------------------------------------------------------------------------------------------------------------------ Invalid input(s): POCBNP  ---------------------------------------------------------------------------------------------------------------  Urinalysis    Component Value Date/Time   COLORURINE YELLOW (A) 12/24/2018 0228   APPEARANCEUR CLEAR (A) 12/24/2018 0228   APPEARANCEUR Clear 11/23/2012 0629   LABSPEC 1.016 12/24/2018 0228   LABSPEC 1.018 11/23/2012 0629   PHURINE 5.0 12/24/2018 0228   GLUCOSEU >=500 (A) 12/24/2018 0228   GLUCOSEU Negative 11/23/2012 0629   HGBUR NEGATIVE 12/24/2018 0228   BILIRUBINUR NEGATIVE 12/24/2018 0228   BILIRUBINUR Negative 11/23/2012 0629   KETONESUR NEGATIVE 12/24/2018 0228   PROTEINUR NEGATIVE 12/24/2018 0228   NITRITE NEGATIVE 12/24/2018 0228   LEUKOCYTESUR NEGATIVE 12/24/2018 0228   LEUKOCYTESUR Negative 11/23/2012 0629     RADIOLOGY: Ct Head Wo Contrast  Result Date: 12/24/2018 CLINICAL DATA:  Confusion and elevated glucose level EXAM: CT HEAD WITHOUT CONTRAST TECHNIQUE: Contiguous axial images were  obtained from the base of the skull through the vertex without intravenous contrast. COMPARISON:  06/30/2018 FINDINGS: Brain: Mild atrophic changes are noted. No findings to suggest acute hemorrhage, acute infarction or space-occupying mass lesion are noted. Vascular: No hyperdense vessel or unexpected calcification. Skull: Normal. Negative for fracture or focal lesion. Sinuses/Orbits: No acute finding. Other: None. IMPRESSION: Atrophic changes without acute intracranial abnormality noted. Electronically Signed   By: Inez Catalina M.D.   On: 12/24/2018 01:40   Mr Brain Wo Contrast  Result Date: 12/24/2018 CLINICAL DATA:  Altered level of consciousness. Diabetes. Hyperglycemia. EXAM: MRI HEAD WITHOUT CONTRAST TECHNIQUE: Multiplanar, multiecho pulse sequences of the brain and surrounding structures were obtained without intravenous contrast. COMPARISON:  CT head without contrast 12/24/2018.  MRI brain/19/19 FINDINGS: Brain: The diffusion-weighted images demonstrate no acute or subacute infarction. Mild atrophy is within normal limits for age. 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 Dilated perivascular spaces are present within the basal ganglia. Remote ischemic changes of the thalami are stable. The brainstem and cerebellum are within normal limits. Vascular: Flow is present in the major intracranial arteries. Skull and upper cervical spine: The craniocervical junction is normal. Upper cervical spine is within normal limits. Marrow signal is unremarkable. Sinuses/Orbits: Mild mucosal thickening is present in the ethmoid air cells bilaterally. There are  no fluid levels. Minimal fluid is present in the mastoid air cells bilaterally. The paranasal sinuses are otherwise clear. The globes and orbits are within normal limits. IMPRESSION: 1. Mild ischemic changes involving the basal ganglia and thalami are chronic. 2. No acute intracranial abnormality or focal lesion to explain  the patient's acute symptoms. 3. Mild chronic ethmoid sinus disease. No significant acute disease. Electronically Signed   By: San Morelle M.D.   On: 12/24/2018 04:29   Dg Chest Portable 1 View  Result Date: 12/24/2018 CLINICAL DATA:  73 y/o  M; altered mental status. EXAM: PORTABLE CHEST 1 VIEW COMPARISON:  06/30/2018 chest radiograph. FINDINGS: Stable normal cardiac silhouette given projection and technique. Aortic calcific atherosclerosis. Low lung volumes accentuate pulmonary markings. No consolidation, effusion, or pneumothorax. Multilevel degenerative changes of the spine. Productive changes of right acromioclavicular joint. No acute osseous abnormality is evident IMPRESSION: Low lung volumes. No acute pulmonary process identified. Electronically Signed   By: Kristine Garbe M.D.   On: 12/24/2018 01:29    EKG: Orders placed or performed during the hospital encounter of 12/24/18  . EKG 12-Lead  . EKG 12-Lead  . ED EKG  . ED EKG  . EKG 12-Lead  . EKG 12-Lead    IMPRESSION AND PLAN: *Acute recurrent toxic metabolic encephalopathy Noted numerous hospital notations for altered mental status/encephalopathy-extensive neurovascular work-up at the end of last year at our hospital which was unimpressive, history of drug-induced Parkinson's disease, history of bipolar illness Suspect due to organic brain disease compounded by polypharmacy with history of bipolar illness Refer to the observation unit, will try to hold unnecessary psychotropic meds, CT head/MRI of the brain was negative, ammonia level was negative, check RPR, check urine drug screen, neurochecks per routine, aspiration/fall/skin care precautions while in house, gentle IV fluids for rehydration  *Acute hyperglycemia with chronic diabetes mellitus type 2 Continue home regiment with exception of metformin, high-dose sliding scale insulin with Accu-Cheks per routine, heart healthy/carbohydrate consistent  diet  *Chronic bipolar illness Continue Seroquel, hold Paxil, psychiatry/NP [place] consulted for expert opinion  *Chronic hyperlipidemia, unspecified Continue statin therapy  *Chronic obstructive sleep apnea CPAP at bedtime/as needed  *Chronic hypertension Stable Continue Zestoretic, PRN hydralazine   All the records are reviewed and case discussed with ED provider. Management plans discussed with the patient, family and they are in agreement.  CODE STATUS:full    Code Status Orders  (From admission, onward)         Start     Ordered   12/24/18 0948  Full code  Continuous     12/24/18 0947        Code Status History    Date Active Date Inactive Code Status Order ID Comments User Context   06/30/2018 1048 07/01/2018 1759 Full Code [id number]  Henreitta Leber, MD Inpatient   02/23/2018 0623 02/23/2018 2340 Full Code [id number]  Lance Coon, MD Inpatient   06/25/2017 0646 06/26/2017 1336 Full Code [id number]  Saundra Shelling, MD Inpatient   07/12/2016 2022 07/13/2016 2311 Full Code [id number]  Demetrios Loll, MD Inpatient   06/27/2016 1656 06/29/2016 2034 Full Code [id number]  Dereck Leep, MD Inpatient   05/30/2016 1639 06/01/2016 1501 Full Code [id number]  Theodoro Grist, MD Inpatient    Advance Directive Documentation     Most Recent Value  Type of Advance Directive  Healthcare Power of Attorney, Living will  Pre-existing out of facility DNR order (yellow form or pink MOST form)  -  "MOST" Form in Place?  -  TOTAL TIME TAKING CARE OF THIS PATIENT: 35 minutes.    Avel Peace Donnivan Villena M.D on 12/24/2018   Between 7am to 6pm - Pager - [phone]  After 6pm go to [email or web address] - password [email or web address] [place] [facility]  Office  [phone]  CC: Primary care physician; Kirk Ruths, MD   Note: This dictation was prepared with Dragon dictation along with smaller phrase technology. Any transcriptional errors that result from this process are  unintentional.

## 2018-12-24 NOTE — Consult Note (Addendum)
[facility] Consult   Reason for Consult:  Confusion, medications Referring Physician:  Dr Jerelyn Charles Patient Identification: Patrick Turner MRN:  [id number] Principal Diagnosis: Mood disorder Diagnosis:  Active Problems:   AMS (altered mental status)  Total Time spent with patient: 45 minutes  Subjective:   Patrick Turner is a 73 y.o. male patient presented with confusion, diagnosis of mood disorder.  Denies suicidal/homicidal ideations, hallucinations, and substance abuse.  He reports his blood sugars were off.  Restart his Paxil 40 mg daily, Seroquel 100 mg at bedtime, and Klonopin 0.5 mg at bedtime--confirmed by his regular psychiatrist, Dr Thurmond Butts per Quita Skye (his secretary).  Continue to follow psychiatrically and clear any confounding medical issues.  HPI:  73 yo male who was brought to the ED after found with confusion and slow to respond at work, at [employer]  History of DM with 340 blood glucose level.  On assessment, he had some paucity of speech as he was trying to remember certain things.  He did report he was admitted to [facility] "years ago" for threatening to kill someone and himself.  He was treated by Dr Thurmond Butts and has been following outpatient with him "ever since."  Pharmacy was contacted and he had not picked up his Levemir insulin but has his oral diabetic medications.  Dr Ryan's office was called and Quita Skye, his Network engineer, reported he is usually clear, coherent, and compliant with medications.  His wife was contacted after permission obtained from the patient and she confirms he usually takes his medications independently and is not confused unless his blood sugars are not stable.  Pleasant and cooperative on assessment.  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  Multiple office visits for uncontrolled diabetes, TIA admission note from 02/23/18 but ruled out.  He is not agitated or hallucinating, does report hallucinating in the past but not  lately.  No substance abuse.  Medications restarted and monitor for stability of mental clarity.    Past Psychiatric History: mood disorder, anxiety  Risk to Self:  none Risk to Others:  none Prior Inpatient Therapy:  [facility] "years ago" Prior Outpatient Therapy:  DR Thurmond Butts  Past Medical History:  Past Medical History:  Diagnosis Date  . Altered mental status   . Anemia   . Anxiety   . Aortic atherosclerosis ([place])   . Arthritis   . Coronary artery disease   . CRD (chronic renal disease)   . Depression   . Diabetes mellitus without complication ([place])   . Encephalopathy acute   . GERD (gastroesophageal reflux disease)   . Hypercholesteremia   . Hypertension   . Neuropathy   . Severe obesity ([place])   . Sleep apnea   . Sleep terror    per patient this year per patient     Past Surgical History:  Procedure Laterality Date  . APPENDECTOMY    . CIRCUMCISION    . COLONOSCOPY    . COLONOSCOPY WITH PROPOFOL N/A 07/10/2018   Procedure: COLONOSCOPY WITH PROPOFOL;  Surgeon: Lollie Sails, MD;  Location: [facility] ENDOSCOPY;  Service: Endoscopy;  Laterality: N/A;  . ESOPHAGOGASTRODUODENOSCOPY    . JOINT REPLACEMENT    . 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 KNEE ARTHROSCOPY Right   . TONSILLECTOMY     Family History:  Family History  Problem Relation  Age of Onset  . Diabetes Mother   . Heart disease Mother   . Cancer Father   . COPD Father    Family Psychiatric  History: none Social History:  Social History   Substance and Sexual Activity  Alcohol Use Yes   Comment: rare     Social History   Substance and Sexual Activity  Drug Use No    Social History   Socioeconomic History  . Marital status: Married    Spouse name: Not on file  . Number of children: Not on file  . Years of education: Not on file  . Highest  education level: Not on file  Occupational History  . Occupation: works at Architect  . Financial resource strain: Not on file  . Food insecurity:    Worry: Not on file    Inability: Not on file  . Transportation needs:    Medical: Not on file    Non-medical: Not on file  Tobacco Use  . Smoking status: Never Smoker  . Smokeless tobacco: Never Used  Substance and Sexual Activity  . Alcohol use: Yes    Comment: rare  . Drug use: No  . Sexual activity: Not on file  Lifestyle  . Physical activity:    Days per week: Not on file    Minutes per session: Not on file  . Stress: Not on file  Relationships  . Social connections:    Talks on phone: Not on file    Gets together: Not on file    Attends religious service: Not on file    Active member of club or organization: Not on file    Attends meetings of clubs or organizations: Not on file    Relationship status: Not on file  Other Topics Concern  . Not on file  Social History Narrative  . Not on file   Additional Social History:    Allergies:   Allergies  Allergen Reactions  . Penicillins Anaphylaxis    Has patient had a PCN reaction causing immediate rash, facial/tongue/throat swelling, SOB or lightheadedness with hypotension: Yes Has patient had a PCN reaction causing severe rash involving mucus membranes or skin necrosis: No Has patient had a PCN reaction that required hospitalization Yes Has patient had a PCN reaction occurring within the last 10 years: No If all of the above answers are "NO", then may proceed with Cephalosporin use.  . Tavist [Clemastine] Swelling    Labs:  Results for orders placed or performed during the hospital encounter of 12/24/18 (from the past 48 hour(s))  Glucose, capillary     Status: Abnormal   Collection Time: 12/24/18 12:41 AM  Result Value Ref Range   Glucose-Capillary 340 (H) 70 - 99 mg/dL  Comprehensive metabolic panel     Status: Abnormal   Collection Time:  12/24/18  1:05 AM  Result Value Ref Range   Sodium 137 135 - 145 mmol/L   Potassium 4.4 3.5 - 5.1 mmol/L   Chloride 109 98 - 111 mmol/L   CO2 22 22 - 32 mmol/L   Glucose, Bld 379 (H) 70 - 99 mg/dL   BUN 28 (H) 8 - 23 mg/dL   Creatinine, Ser 1.49 (H) 0.61 - 1.24 mg/dL   Calcium 8.3 (L) 8.9 - 10.3 mg/dL   Total Protein 6.4 (L) 6.5 - 8.1 g/dL   Albumin 3.6 3.5 - 5.0 g/dL   AST 20 15 - 41 U/L   ALT 14 0 -  44 U/L   Alkaline Phosphatase 75 38 - 126 U/L   Total Bilirubin 0.4 0.3 - 1.2 mg/dL   GFR calc non Af Amer 46 (L) >60 mL/min   GFR calc Af Amer 53 (L) >60 mL/min   Anion gap 6 5 - 15    Comment: Performed at [facility], [address] [place], [place] [address]  Troponin I - Once     Status: Abnormal   Collection Time: 12/24/18  1:05 AM  Result Value Ref Range   Troponin I 0.03 (HH) <0.03 ng/mL    Comment: CRITICAL RESULT CALLED TO, READ BACK BY AND VERIFIED WITH JEN DAILEY RN 12/24/2018 @ 0130 RDW Performed at [facility], [address] [place], [place] [address]   Brain natriuretic peptide     Status: None   Collection Time: 12/24/18  1:05 AM  Result Value Ref Range   B Natriuretic Peptide 82.0 0.0 - 100.0 pg/mL    Comment: Performed at [facility], [address] [place], [place] [address]  CBC with Differential     Status: Abnormal   Collection Time: 12/24/18  1:05 AM  Result Value Ref Range   WBC 6.0 4.0 - 10.5 K/uL   RBC 3.48 (L) 4.22 - 5.81 MIL/uL   Hemoglobin 8.6 (L) 13.0 - 17.0 g/dL   HCT 28.5 (L) 39.0 - 52.0 %   MCV 81.9 80.0 - 100.0 fL   MCH 24.7 (L) 26.0 - 34.0 pg   MCHC 30.2 30.0 - 36.0 g/dL   RDW 16.4 (H) 11.5 - 15.5 %   Platelets 244 150 - 400 K/uL   nRBC 0.0 0.0 - 0.2 %   Neutrophils Relative % 59 %   Neutro Abs 3.5 1.7 - 7.7 K/uL   Lymphocytes Relative 26 %   Lymphs Abs 1.6 0.7 - 4.0 K/uL   Monocytes Relative 11 %   Monocytes Absolute 0.6 0.1 - 1.0 K/uL   Eosinophils Relative 3 %   Eosinophils Absolute 0.2 0.0 -  0.5 K/uL   Basophils Relative 1 %   Basophils Absolute 0.0 0.0 - 0.1 K/uL   Immature Granulocytes 0 %   Abs Immature Granulocytes 0.01 0.00 - 0.07 K/uL    Comment: Performed at [facility], [address] [place], [place] [address]  TSH     Status: None   Collection Time: 12/24/18  1:05 AM  Result Value Ref Range   TSH 1.663 0.350 - 4.500 uIU/mL    Comment: Performed by a 3rd Generation assay with a functional sensitivity of <=0.01 uIU/mL. Performed at [facility], [address] [place], [place] [address]   Urinalysis, Complete w Microscopic     Status: Abnormal   Collection Time: 12/24/18  2:28 AM  Result Value Ref Range   Color, Urine YELLOW (A) YELLOW   APPearance CLEAR (A) CLEAR   Specific Gravity, Urine 1.016 1.005 - 1.030   pH 5.0 5.0 - 8.0   Glucose, UA >=500 (A) NEGATIVE mg/dL   Hgb urine dipstick NEGATIVE NEGATIVE   Bilirubin Urine NEGATIVE NEGATIVE   Ketones, ur NEGATIVE NEGATIVE mg/dL   Protein, ur NEGATIVE NEGATIVE mg/dL   Nitrite NEGATIVE NEGATIVE   Leukocytes,Ua NEGATIVE NEGATIVE   RBC / HPF 0-5 0 - 5 RBC/hpf   WBC, UA 0-5 0 - 5 WBC/hpf   Bacteria, UA NONE SEEN NONE SEEN   Squamous Epithelial / LPF 0-5 0 - 5    Comment: Performed at [facility],  [place], [place] [address]  Troponin I - Once     Status: None   Collection Time: 12/24/18  2:28 AM  Result Value Ref Range   Troponin I <0.03 <0.03 ng/mL    Comment: Performed at [facility], [address] [place], [place] [address]  Glucose, capillary     Status: Abnormal   Collection Time: 12/24/18  8:02 AM  Result Value Ref Range   Glucose-Capillary 168 (H) 70 - 99 mg/dL  Ammonia     Status: None   Collection Time: 12/24/18  8:27 AM  Result Value Ref Range   Ammonia 10 9 - 35 umol/L    Comment: Performed at [facility], [address] [place], [place] [address]  Glucose, capillary     Status: Abnormal   Collection Time: 12/24/18 11:49  AM  Result Value Ref Range   Glucose-Capillary 201 (H) 70 - 99 mg/dL    Current Facility-Administered Medications  Medication Dose Route Frequency Provider Last Rate Last Dose  . 0.9 %  sodium chloride infusion   Intravenous Continuous Salary, Montell D, MD 100 mL/hr at 12/24/18 1001    . acetaminophen (TYLENOL) tablet 650 mg  650 mg Oral Q6H PRN Salary, Montell D, MD       Or  . acetaminophen (TYLENOL) suppository 650 mg  650 mg Rectal Q6H PRN Salary, Montell D, MD      . aspirin EC tablet 81 mg  81 mg Oral Daily Salary, Montell D, MD   81 mg at 12/24/18 1225  . atorvastatin (LIPITOR) tablet 80 mg  80 mg Oral QHS Salary, Montell D, MD      . clonazePAM (KLONOPIN) tablet 0.5 mg  0.5 mg Oral QHS Brittanny Levenhagen Y, NP      . enoxaparin (LOVENOX) injection 40 mg  40 mg Subcutaneous Q24H Salary, Montell D, MD   40 mg at 12/24/18 1225  . lisinopril (PRINIVIL,ZESTRIL) tablet 20 mg  20 mg Oral Daily Salary, Montell D, MD   20 mg at 12/24/18 1225   And  . hydrochlorothiazide (MICROZIDE) capsule 12.5 mg  12.5 mg Oral Daily Salary, Montell D, MD   12.5 mg at 12/24/18 1225  . insulin aspart (novoLOG) injection 0-20 Units  0-20 Units Subcutaneous TID WC Salary, Montell D, MD   7 Units at 12/24/18 1231  . insulin aspart (novoLOG) injection 0-5 Units  0-5 Units Subcutaneous QHS Salary, Montell D, MD      . insulin detemir (LEVEMIR) injection 12 Units  12 Units Subcutaneous Daily Salary, Avel Peace, MD   12 Units at 12/24/18 1225  . magnesium hydroxide (MILK OF MAGNESIA) suspension 30 mL  30 mL Oral Daily PRN Salary, Montell D, MD      . meclizine (ANTIVERT) tablet 25 mg  25 mg Oral TID PRN Loney Hering D, MD   25 mg at 12/24/18 1223  . multivitamin with minerals tablet 1 tablet  1 tablet Oral Daily Salary, Montell D, MD   1 tablet at 12/24/18 1225  . omega-3 acid ethyl esters (LOVAZA) capsule 1 g  1 g Oral Daily Salary, Montell D, MD   1 g at 12/24/18 1225  . ondansetron (ZOFRAN) tablet 4 mg  4 mg Oral  Q6H PRN Salary, Montell D, MD       Or  . ondansetron (ZOFRAN) injection 4 mg  4 mg Intravenous Q6H PRN Salary, Montell D, MD      . pantoprazole (PROTONIX) EC tablet  40 mg  40 mg Oral Daily Salary, Montell D, MD   40 mg at 12/24/18 1225  . PARoxetine (PAXIL) tablet 40 mg  40 mg Oral Daily Waylan Boga Y, NP      . pioglitazone (ACTOS) tablet 15 mg  15 mg Oral Daily Salary, Montell D, MD   15 mg at 12/24/18 1225  . QUEtiapine (SEROQUEL) tablet 100 mg  100 mg Oral QHS Patrecia Pour, NP      . vitamin B-12 (CYANOCOBALAMIN) tablet 1,000 mcg  1,000 mcg Oral Daily Salary, Montell D, MD   1,000 mcg at 12/24/18 1225    Musculoskeletal: Strength & Muscle Tone: within normal limits Gait & Station: normal Patient leans: N/A  Psychiatric Specialty Exam: Physical Exam  Nursing note and vitals reviewed. Constitutional: He appears well-developed and well-nourished.  HENT:  Head: Normocephalic.  Neck: Thyromegaly present.  Respiratory: Effort normal.  Musculoskeletal: Normal range of motion.  Neurological: He is alert.  Psychiatric: His speech is normal and behavior is normal. Judgment and thought content normal. His mood appears anxious. Cognition and memory are impaired.    Review of Systems  Psychiatric/Behavioral: The patient is nervous/anxious.   All other systems reviewed and are negative.   Blood pressure (!) 170/64, pulse (!) 58, temperature 97.9 F (36.6 C), temperature source Oral, resp. rate 14, height 5\' 8"  (1.727 m), SpO2 96 %.Body mass index is 28.89 kg/m.  General Appearance: Casual  Eye Contact:  Good  Speech:  Normal Rate  Volume:  Normal  Mood:  Anxious, mild  Affect:  Congruent  Thought Process:  Difficulty remembering specific things at times  Orientation:  Full (Time, Place, and Person)  Thought Content:  Logical  Suicidal Thoughts:  No  Homicidal Thoughts:  No  Memory:  Immediate;   Fair Recent;   Fair Remote;   Fair  Judgement:  Impaired  Insight:  Fair   Psychomotor Activity:  Decreased  Concentration:  Concentration: Good and Attention Span: Good  Recall:  [place] of Knowledge:  Good  Language:  Good  Akathisia:  No  Handed:  Right  AIMS (if indicated):     Assets:  Housing Leisure Time Resilience Social Support  ADL's:  Intact  Cognition:  Impaired,  Mild  Sleep:        Treatment Plan Summary: Daily contact with patient to assess and evaluate symptoms and progress in treatment, Medication management and Plan Mood disorder:  -Restarted Paxil 40 mg daily -Restarted Seroquel 100 mg at bedtime  Anxiety: -Restarted Klonopin 0.5 mg at bedtime  Disposition: Supportive therapy provided about ongoing stressors.  Waylan Boga, NP 12/24/2018 4:49 PM

## 2018-12-24 NOTE — Care Management Obs Status (Signed)
[facility] NOTIFICATION   Patient Details  Name: JAVARIUS TSOSIE MRN: [id number] Date of Birth: 08/13/1946   Medicare Observation Status Notification Given:  Yes    Shayaan Parke Jen Mow, RN 12/24/2018, 4:01 PM

## 2018-12-24 NOTE — ED Notes (Signed)
ED TO INPATIENT HANDOFF REPORT  ED Nurse Name and Phone #: Vicente Males [phone]  S Name/Age/Gender Patrick Turner 73 y.o. male Room/Bed: ED16A/ED16A  Code Status   Code Status: Prior  Home/SNF/Other Home Patient oriented to: self, place, time, situation Is this baseline? Yes      Chief Complaint hyperglycemia  Triage Note Per EMS pt was at work at [employer] and someone found him confused and slow to respond. Pt with a hx of DM. Glucose 340 on arrival   Allergies Allergies  Allergen Reactions  . Penicillins Anaphylaxis    Has patient had a PCN reaction causing immediate rash, facial/tongue/throat swelling, SOB or lightheadedness with hypotension: Yes Has patient had a PCN reaction causing severe rash involving mucus membranes or skin necrosis: No Has patient had a PCN reaction that required hospitalization Yes Has patient had a PCN reaction occurring within the last 10 years: No If all of the above answers are "NO", then may proceed with Cephalosporin use.  Simona Huh [[place]] Swelling    Level of Care/Admitting Diagnosis ED Disposition    [facility] [place] [[id number]]  Level of Care: Med-Surg [16]  Diagnosis: AMS (altered mental status) [6979480]  Admitting Physician: Gorden Harms [[id number]]  Attending Physician: Gorden Harms [[id number]]  PT Class (Do Not Modify): Observation [104]  PT Acc Code (Do Not Modify): Observation [10022]       B Medical/Surgery History Past Medical History:  Diagnosis Date  . Altered mental status   . Anemia   . Anxiety   . Aortic atherosclerosis ([place])   . Arthritis   . Coronary artery disease   . CRD (chronic renal disease)   . Depression   . Diabetes mellitus without complication ([place])   . Encephalopathy acute   . GERD (gastroesophageal reflux disease)   . Hypercholesteremia   . Hypertension   . Neuropathy   . Severe obesity ([place])   . Sleep apnea    . Sleep terror    per patient this year per patient    Past Surgical History:  Procedure Laterality Date  . APPENDECTOMY    . CIRCUMCISION    . COLONOSCOPY    . COLONOSCOPY WITH PROPOFOL N/A 07/10/2018   Procedure: COLONOSCOPY WITH PROPOFOL;  Surgeon: Lollie Sails, MD;  Location: [facility] ENDOSCOPY;  Service: Endoscopy;  Laterality: N/A;  . ESOPHAGOGASTRODUODENOSCOPY    . JOINT REPLACEMENT    . 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 KNEE ARTHROSCOPY Right   . TONSILLECTOMY       A IV Location/Drains/Wounds Patient Lines/Drains/Airways Status   Active Line/Drains/Airways    Name:   Placement date:   Placement time:   Site:   Days:   Peripheral IV 12/24/18 Right;Posterior Forearm   12/24/18    0100    Forearm   less than 1   Airway   07/10/18    0741     167          Intake/Output Last 24 hours  Intake/Output Summary (Last 24 hours) at 12/24/2018 0836 Last data filed at 12/24/2018 7544 Gross per 24 hour  Intake 1000 ml  Output -  Net 1000 ml    Labs/Imaging Results for orders placed or performed during the hospital encounter of 12/24/18 (from the past 48 hour(s))  Glucose, capillary     Status: Abnormal   Collection Time: 12/24/18 12:41 AM  Result Value Ref Range   Glucose-Capillary 340 (H) 70 - 99 mg/dL  Comprehensive metabolic panel     Status: Abnormal   Collection Time: 12/24/18  1:05 AM  Result Value Ref Range   Sodium 137 135 - 145 mmol/L   Potassium 4.4 3.5 - 5.1 mmol/L   Chloride 109 98 - 111 mmol/L   CO2 22 22 - 32 mmol/L   Glucose, Bld 379 (H) 70 - 99 mg/dL   BUN 28 (H) 8 - 23 mg/dL   Creatinine, Ser 1.49 (H) 0.61 - 1.24 mg/dL   Calcium 8.3 (L) 8.9 - 10.3 mg/dL   Total Protein 6.4 (L) 6.5 - 8.1 g/dL   Albumin 3.6 3.5 - 5.0 g/dL   AST 20 15 - 41 U/L   ALT 14 0 - 44 U/L   Alkaline Phosphatase 75 38 -  126 U/L   Total Bilirubin 0.4 0.3 - 1.2 mg/dL   GFR calc non Af Amer 46 (L) >60 mL/min   GFR calc Af Amer 53 (L) >60 mL/min   Anion gap 6 5 - 15    Comment: Performed at [facility], [address] [place], [place] [address]  Troponin I - Once     Status: Abnormal   Collection Time: 12/24/18  1:05 AM  Result Value Ref Range   Troponin I 0.03 (HH) <0.03 ng/mL    Comment: CRITICAL RESULT CALLED TO, READ BACK BY AND VERIFIED WITH JEN DAILEY RN 12/24/2018 @ 0130 RDW Performed at [facility], [address] [place], [place] [address]   Brain natriuretic peptide     Status: None   Collection Time: 12/24/18  1:05 AM  Result Value Ref Range   B Natriuretic Peptide 82.0 0.0 - 100.0 pg/mL    Comment: Performed at [facility], [address] [place], [place] [address]  CBC with Differential     Status: Abnormal   Collection Time: 12/24/18  1:05 AM  Result Value Ref Range   WBC 6.0 4.0 - 10.5 K/uL   RBC 3.48 (L) 4.22 - 5.81 MIL/uL   Hemoglobin 8.6 (L) 13.0 - 17.0 g/dL   HCT 28.5 (L) 39.0 - 52.0 %   MCV 81.9 80.0 - 100.0 fL   MCH 24.7 (L) 26.0 - 34.0 pg   MCHC 30.2 30.0 - 36.0 g/dL   RDW 16.4 (H) 11.5 - 15.5 %   Platelets 244 150 - 400 K/uL   nRBC 0.0 0.0 - 0.2 %   Neutrophils Relative % 59 %   Neutro Abs 3.5 1.7 - 7.7 K/uL   Lymphocytes Relative 26 %   Lymphs Abs 1.6 0.7 - 4.0 K/uL   Monocytes Relative 11 %   Monocytes Absolute 0.6 0.1 - 1.0 K/uL   Eosinophils Relative 3 %   Eosinophils Absolute 0.2 0.0 - 0.5 K/uL   Basophils Relative 1 %   Basophils Absolute 0.0 0.0 - 0.1 K/uL   Immature Granulocytes 0 %   Abs Immature Granulocytes 0.01 0.00 - 0.07 K/uL    Comment: Performed at [facility], [address] [place], [place] [address]  Urinalysis, Complete w Microscopic     Status: Abnormal   Collection Time: 12/24/18  2:28 AM  Result Value Ref Range   Color, Urine YELLOW (A) YELLOW   APPearance CLEAR (A) CLEAR   Specific Gravity,  Urine 1.016 1.005 - 1.030  pH 5.0 5.0 - 8.0   Glucose, UA >=500 (A) NEGATIVE mg/dL   Hgb urine dipstick NEGATIVE NEGATIVE   Bilirubin Urine NEGATIVE NEGATIVE   Ketones, ur NEGATIVE NEGATIVE mg/dL   Protein, ur NEGATIVE NEGATIVE mg/dL   Nitrite NEGATIVE NEGATIVE   Leukocytes,Ua NEGATIVE NEGATIVE   RBC / HPF 0-5 0 - 5 RBC/hpf   WBC, UA 0-5 0 - 5 WBC/hpf   Bacteria, UA NONE SEEN NONE SEEN   Squamous Epithelial / LPF 0-5 0 - 5    Comment: Performed at [facility], [address] [place], [place] [address]  Troponin I - Once     Status: None   Collection Time: 12/24/18  2:28 AM  Result Value Ref Range   Troponin I <0.03 <0.03 ng/mL    Comment: Performed at [facility], [place], [place] [address]  Glucose, capillary     Status: Abnormal   Collection Time: 12/24/18  8:02 AM  Result Value Ref Range   Glucose-Capillary 168 (H) 70 - 99 mg/dL   Ct Head Wo Contrast  Result Date: 12/24/2018 CLINICAL DATA:  Confusion and elevated glucose level EXAM: CT HEAD WITHOUT CONTRAST TECHNIQUE: Contiguous axial images were obtained from the base of the skull through the vertex without intravenous contrast. COMPARISON:  06/30/2018 FINDINGS: Brain: Mild atrophic changes are noted. No findings to suggest acute hemorrhage, acute infarction or space-occupying mass lesion are noted. Vascular: No hyperdense vessel or unexpected calcification. Skull: Normal. Negative for fracture or focal lesion. Sinuses/Orbits: No acute finding. Other: None. IMPRESSION: Atrophic changes without acute intracranial abnormality noted. Electronically Signed   By: Inez Catalina M.D.   On: 12/24/2018 01:40   Mr Brain Wo Contrast  Result Date: 12/24/2018 CLINICAL DATA:  Altered level of consciousness. Diabetes. Hyperglycemia. EXAM: MRI HEAD WITHOUT CONTRAST TECHNIQUE: Multiplanar, multiecho pulse sequences of the brain and surrounding structures were obtained without intravenous contrast. COMPARISON:   CT head without contrast 12/24/2018.  MRI brain/19/19 FINDINGS: Brain: The diffusion-weighted images demonstrate no acute or subacute infarction. Mild atrophy is within normal limits for age. 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 Dilated perivascular spaces are present within the basal ganglia. Remote ischemic changes of the thalami are stable. The brainstem and cerebellum are within normal limits. Vascular: Flow is present in the major intracranial arteries. Skull and upper cervical spine: The craniocervical junction is normal. Upper cervical spine is within normal limits. Marrow signal is unremarkable. Sinuses/Orbits: Mild mucosal thickening is present in the ethmoid air cells bilaterally. There are no fluid levels. Minimal fluid is present in the mastoid air cells bilaterally. The paranasal sinuses are otherwise clear. The globes and orbits are within normal limits. IMPRESSION: 1. Mild ischemic changes involving the basal ganglia and thalami are chronic. 2. No acute intracranial abnormality or focal lesion to explain the patient's acute symptoms. 3. Mild chronic ethmoid sinus disease. No significant acute disease. Electronically Signed   By: San Morelle M.D.   On: 12/24/2018 04:29   Dg Chest Portable 1 View  Result Date: 12/24/2018 CLINICAL DATA:  73 y/o  M; altered mental status. EXAM: PORTABLE CHEST 1 VIEW COMPARISON:  06/30/2018 chest radiograph. FINDINGS: Stable normal cardiac silhouette given projection and technique. Aortic calcific atherosclerosis. Low lung volumes accentuate pulmonary markings. No consolidation, effusion, or pneumothorax. Multilevel degenerative changes of the spine. Productive changes of right acromioclavicular joint. No acute osseous abnormality is evident IMPRESSION: Low lung volumes. No acute pulmonary process identified.  Electronically Signed   By: Kristine Garbe M.D.   On: 12/24/2018 01:29    Pending  Labs Unresulted Labs (From admission, onward)    Start     Ordered   12/24/18 0800  Urine drugs of abuse scrn w alc, routine (Ref Lab)  Once,   STAT     12/24/18 0759   12/24/18 0759  Ammonia  Once,   STAT     12/24/18 0759   12/24/18 0759  RPR  Once,   STAT     12/24/18 0759   Signed and Held  CBC  (enoxaparin (LOVENOX)    CrCl >/= 30 ml/min)  Once,   R    Comments:  Baseline for enoxaparin therapy IF NOT ALREADY DRAWN.  Notify MD if PLT < 100 K.    Signed and Held   Signed and Held  Creatinine, serum  (enoxaparin (LOVENOX)    CrCl >/= 30 ml/min)  Once,   R    Comments:  Baseline for enoxaparin therapy IF NOT ALREADY DRAWN.    Signed and Held   Signed and Held  Creatinine, serum  (enoxaparin (LOVENOX)    CrCl >/= 30 ml/min)  Weekly,   R    Comments:  while on enoxaparin therapy    Signed and Held   Signed and Held  TSH  Once,   R     Signed and Held   Signed and Held  Hemoglobin A1c  Once,   R    Comments:  To assess prior glycemic control    Signed and Held          Vitals/Pain Today's Vitals   12/24/18 0700 12/24/18 0710 12/24/18 0724 12/24/18 0730  BP: (!) 166/63   (!) 154/70  Pulse:   (!) 59 60  Resp: 14  12   SpO2:   98% 100%  Height:      PainSc:  0-No pain      Isolation Precautions No active isolations  Medications Medications  sodium chloride 0.9 % bolus 1,000 mL (0 mLs Intravenous Stopped 12/24/18 0724)    Mobility walks Low fall risk   Focused Assessments Altered mental status   R Recommendations: See Admitting Provider Note  Report given to:   Additional Notes: Patient is now alert and oriented and answering questions appropriately.

## 2018-12-24 NOTE — Progress Notes (Signed)
Spoke with patient's wofe with his permission and she reported he is only confused when his blood "sugars are off."  Takes his medications independently, see Dr Thurmond Butts.  She does not know what he takes, he confirmed Klonopin 0.5 mg at bedtime and Seroquel but issues remembering his others.   Dr Daryl Eastern office number at [phone] was busy, tried twice.  Pharmacy was called to verify medications and not confirmed so called his pharmacy [facility] on [address] [phone]:  Klonopin 0.5 mg at bedtime, Actos 30 mg daily, metformin ER 1000 mg BID, and Levemir 12 units at bedtime but he has not picked this up--still sitting in the bin.  Waylan Boga, PMHNP

## 2018-12-24 NOTE — ED Provider Notes (Signed)
[facility] Emergency Department Provider Note   ____________________________________________   First MD Initiated Contact with Patient 12/24/18 [phone]     (approximate)  I have reviewed the triage vital signs and the nursing notes.   HISTORY  Chief Complaint Hyperglycemia    HPI Patrick Turner is a 73 y.o. male who comes in with history of confusion.  He was last seen at the [employer] at 1050 normal and then sometime after that policeman went to see him the door was locked he banged on the door and the patient was unsteady and seemed to be confused EMS was called patient comes in here  .  There was a thought that the patient's blood glucose was low.  Patient is now much better he still very weak he knows the day and the fact that it was Easter Sunday he cannot name the president though.  He has no trouble speaking no numbness no weakness no incoordination.     Past Medical History:  Diagnosis Date   Altered mental status    Anemia    Anxiety    Aortic atherosclerosis (HCC)    Arthritis    Coronary artery disease    CRD (chronic renal disease)    Depression    Diabetes mellitus without complication (HCC)    Encephalopathy acute    GERD (gastroesophageal reflux disease)    Hypercholesteremia    Hypertension    Neuropathy    Severe obesity ([place])    Sleep apnea    Sleep terror    per patient this year per patient     Patient Active Problem List   Diagnosis Date Noted   Dizziness 06/30/2018   Diabetes ([place]) 02/23/2018   HTN (hypertension) 02/23/2018   HLD (hyperlipidemia) 02/23/2018   GERD (gastroesophageal reflux disease) 02/23/2018   TIA (transient ischemic attack) 07/12/2016   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   Acute diarrhea 05/30/2016   Acute encephalopathy 05/30/2016   Anemia 05/30/2016   Acute on chronic renal insufficiency 05/30/2016   Diarrhea 05/30/2016   Unspecified transient  cerebral ischemia 05/29/2012    Past Surgical History:  Procedure Laterality Date   APPENDECTOMY     CIRCUMCISION     COLONOSCOPY     COLONOSCOPY WITH PROPOFOL N/A 07/10/2018   Procedure: COLONOSCOPY WITH PROPOFOL;  Surgeon: Lollie Sails, MD;  Location: [facility] ENDOSCOPY;  Service: Endoscopy;  Laterality: N/A;   ESOPHAGOGASTRODUODENOSCOPY     JOINT REPLACEMENT     KNEE ARTHROPLASTY Right 06/27/2016   Procedure: COMPUTER ASSISTED TOTAL KNEE ARTHROPLASTY;  Surgeon: Dereck Leep, MD;  Location: [facility] ORS;  Service: Orthopedics;  Laterality: Right;   KNEE ARTHROSCOPY Right    TONSILLECTOMY      Prior to Admission medications   Medication Sig Start Date End Date Taking? Authorizing Provider  aspirin 81 MG tablet Take 1 tablet (81 mg total) by mouth daily. 02/23/18   Henreitta Leber, MD  atorvastatin (LIPITOR) 80 MG tablet Take 80 mg by mouth at bedtime. 12/14/15   [provider]  clonazePAM (KLONOPIN) 1 MG tablet Take 0.5 mg by mouth at bedtime. 12/18/15   [provider]  gabapentin (NEURONTIN) 100 MG capsule Take 2 capsules (200 mg total) by mouth 3 (three) times daily. 07/01/18 08/30/18  Henreitta Leber, MD  Glucose Blood (ACCU-CHEK AVIVA PLUS VI) by In Vitro route.    [provider]  LEVEMIR 100 UNIT/ML injection Inject 10 Units into the skin at  bedtime.     [provider]  lisinopril-hydrochlorothiazide (PRINZIDE,ZESTORETIC) 20-12.5 MG per tablet Take 1 tablet by mouth daily.     [provider]  meclizine (ANTIVERT) 25 MG tablet Take 1 tablet (25 mg total) by mouth 3 (three) times daily as needed for dizziness. 07/01/18   Henreitta Leber, MD  metFORMIN (GLUCOPHAGE-XR) 500 MG 24 hr tablet Take 1,000 mg by mouth 2 (two) times daily.    [provider]  Omega-3 Fatty Acids (FISH OIL) 1000 MG CAPS Take by mouth.    [provider]  omeprazole (PRILOSEC) 40 MG capsule Take 40 mg by mouth every morning.    [provider]  PARoxetine (PAXIL) 40 MG tablet Take 40 mg by mouth at bedtime.    [provider]  pioglitazone (ACTOS) 15 MG tablet Take 15 mg by mouth daily.    [provider]  QUEtiapine (SEROQUEL) 100 MG tablet Take 100 mg by mouth at bedtime. 12/22/15   [provider]  topiramate (TOPAMAX) 100 MG tablet Take 100 mg by mouth 2 (two) times daily.    [provider]  vitamin B-12 (CYANOCOBALAMIN) 1000 MCG tablet Take 1,000 mcg by mouth daily.    [provider]    Allergies Penicillins and Tavist [clemastine]  Family History  Problem Relation Age of Onset   Diabetes Mother    Heart disease Mother    Cancer Father    COPD Father     Social History Social History   Tobacco Use   Smoking status: Never Smoker   Smokeless tobacco: Never Used  Substance Use Topics   Alcohol use: Yes    Comment: rare   Drug use: No    Review of Systems  Constitutional: No fever/chills Eyes: No visual changes. ENT: No sore throat. Cardiovascular: Denies chest pain. Respiratory: Denies shortness of breath. Gastrointestinal: No abdominal pain.  No nausea, no vomiting.  No diarrhea.  No constipation. Genitourinary: Negative for dysuria. Musculoskeletal: Negative for back pain. Skin: Negative for rash. Neurological: Negative for headaches, focal weakness ____________________________________________   PHYSICAL EXAM:  VITAL SIGNS: ED Triage Vitals [12/24/18 0044]  Enc Vitals Group     BP      Pulse      Resp      Temp      Temp src      SpO2      Weight      Height 5\' 8"  (1.727 m)     Head Circumference      Peak Flow      Pain Score 0     Pain Loc      Pain Edu?      Excl. in [place]?     Constitutional: Alert and oriented.  Looks a little weak. Eyes: Conjunctivae are normal. PERRL. EOMI. Head: Atraumatic. Nose: No congestion/rhinnorhea. Mouth/Throat: Mucous membranes are moist.  Oropharynx non-erythematous. Neck: No  stridor.   Cardiovascular: Normal rate, regular rhythm. Grossly normal heart sounds.  Good peripheral circulation. Respiratory: Normal respiratory effort.  No retractions. Lungs CTAB. Gastrointestinal: Soft and nontender. No distention. No abdominal bruits. No CVA tenderness. Musculoskeletal: No lower extremity tenderness nor edema.   Neurologic:  Normal speech and language. No gross focal neurologic deficits are appreciated.  Cranial nerves II through XII are intact.  Visual fields were not checked though.  Cranial cerebellar finger-nose rapid alternating movements and hands and heel-to-shin are all normal.  Motor strength is 5/5 throughout and sensation is intact. Skin:  Skin is warm, dry and intact. No rash noted.   ____________________________________________   LABS (all labs ordered are listed, but only abnormal results are displayed)  Labs Reviewed  GLUCOSE, CAPILLARY - Abnormal; Notable for the following components:      Result Value   Glucose-Capillary 340 (*)    All other components within normal limits  COMPREHENSIVE METABOLIC PANEL - Abnormal; Notable for the following components:   Glucose, Bld 379 (*)    BUN 28 (*)    Creatinine, Ser 1.49 (*)    Calcium 8.3 (*)    Total Protein 6.4 (*)    GFR calc non Af Amer 46 (*)    GFR calc Af Amer 53 (*)    All other components within normal limits  TROPONIN I - Abnormal; Notable for the following components:   Troponin I 0.03 (*)    All other components within normal limits  CBC WITH DIFFERENTIAL/PLATELET - Abnormal; Notable for the following components:   RBC 3.48 (*)    Hemoglobin 8.6 (*)    HCT 28.5 (*)    MCH 24.7 (*)    RDW 16.4 (*)    All other components within normal limits  URINALYSIS, COMPLETE (UACMP) WITH MICROSCOPIC - Abnormal; Notable for the following components:   Color, Urine YELLOW (*)    APPearance CLEAR (*)    Glucose, UA >=500 (*)    All other components within normal limits  BRAIN NATRIURETIC PEPTIDE    TROPONIN I   ____________________________________________  EKG  EKG read interpreted by me shows normal sinus rhythm rate of 76 normal axis no acute ST-T wave changes ____________________________________________  RADIOLOGY  ED MD interpretation: CT scan and MRI read by radiology reviewed by me showed no acute disease  Official radiology report(s): Ct Head Wo Contrast  Result Date: 12/24/2018 CLINICAL DATA:  Confusion and elevated glucose level EXAM: CT HEAD WITHOUT CONTRAST TECHNIQUE: Contiguous axial images were obtained from the base of the skull through the vertex without intravenous contrast. COMPARISON:  06/30/2018 FINDINGS: Brain: Mild atrophic changes are noted. No findings to suggest acute hemorrhage, acute infarction or space-occupying mass lesion are noted. Vascular: No hyperdense vessel or unexpected calcification. Skull: Normal. Negative for fracture or focal lesion. Sinuses/Orbits: No acute finding. Other: None. IMPRESSION: Atrophic changes without acute intracranial abnormality noted. Electronically Signed   By: Inez Catalina M.D.   On: 12/24/2018 01:40   Mr Brain Wo Contrast  Result Date: 12/24/2018 CLINICAL DATA:  Altered level of consciousness. Diabetes. Hyperglycemia. EXAM: MRI HEAD WITHOUT CONTRAST TECHNIQUE: Multiplanar, multiecho pulse sequences of the brain and surrounding structures were obtained without intravenous contrast. COMPARISON:  CT head without contrast 12/24/2018.  MRI brain/19/19 FINDINGS: Brain: The diffusion-weighted images demonstrate no acute or subacute infarction. Mild atrophy is within normal limits for age. 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 Dilated perivascular spaces are present within the basal ganglia. Remote ischemic changes of the thalami are stable. The brainstem and cerebellum are within normal limits. Vascular: Flow is present in the major intracranial arteries. Skull and upper cervical spine: The  craniocervical junction is normal. Upper cervical spine is within normal limits. Marrow signal is unremarkable. Sinuses/Orbits: Mild mucosal thickening is present in the ethmoid air cells bilaterally. There are no fluid levels. Minimal fluid is present in the mastoid air cells bilaterally. The paranasal sinuses are otherwise clear. The globes and orbits are within normal limits. IMPRESSION: 1. Mild ischemic changes involving the basal ganglia and  thalami are chronic. 2. No acute intracranial abnormality or focal lesion to explain the patient's acute symptoms. 3. Mild chronic ethmoid sinus disease. No significant acute disease. Electronically Signed   By: San Morelle M.D.   On: 12/24/2018 04:29   Dg Chest Portable 1 View  Result Date: 12/24/2018 CLINICAL DATA:  73 y/o  M; altered mental status. EXAM: PORTABLE CHEST 1 VIEW COMPARISON:  06/30/2018 chest radiograph. FINDINGS: Stable normal cardiac silhouette given projection and technique. Aortic calcific atherosclerosis. Low lung volumes accentuate pulmonary markings. No consolidation, effusion, or pneumothorax. Multilevel degenerative changes of the spine. Productive changes of right acromioclavicular joint. No acute osseous abnormality is evident IMPRESSION: Low lung volumes. No acute pulmonary process identified. Electronically Signed   By: Kristine Garbe M.D.   On: 12/24/2018 01:29    ____________________________________________   PROCEDURES  Procedure(s) performed (including Critical Care):  Procedures Patient has the freestyle libre glucose monitoring thing from [employer] on his arm.  Apparently not supposed to CAT scan this.  The only thing that is on [employer]'s website is CT in through it may and activated.  I am not sure if she can have it near the CT scanner.  This gentleman still has altered mental status.  I think the CAT scan is highly indicated for him.  We will take his arm and wrap it several times with a lead apron  hopefully that will protect this libre sensor.  The patient reports he has to have it changed in 2 days anyway.  ____________________________________________   INITIAL IMPRESSION / ASSESSMENT AND PLAN / ED COURSE     At 5:10 AM patient still with a little slurry speech and a little unsteadiness with his gait.  He is not back to baseline yet.  I will call the hospitalist and see if we can get him admitted.  I am not sure if this was due to low blood sugar or something else but again he is not back to normal.  He tells me that he was at work he got the feeling very woozy and lightheaded went and locked the door went back to the office and just about passed out.  Police and fire did report he was very unsteady on his feet when he came to unlock the door when he banged on it.             ____________________________________________   FINAL CLINICAL IMPRESSION(S) / ED DIAGNOSES  Final diagnoses:  Near syncope     ED Discharge Orders    None       Note:  This document was prepared using Dragon voice recognition software and may include unintentional dictation errors.    Nena Polio, MD 12/24/18 [phone]

## 2018-12-24 NOTE — Evaluation (Signed)
Physical Therapy Evaluation Patient Details Name: Patrick Turner MRN: [id number] DOB: July 29, 1946 Today's Date: 12/24/2018   History of Present Illness  73 y.o. male with a known history per below which includes organic brain disease, drug-induced Parkinson's, bipolar illness, multiple hospitals with documentation for acute altered mental status/encephalopathy, discharged from our hospital at the end of last year asked extensive neurovascular work-up which was unimpressive. 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     Clinical Impression  Patient A&Ox4, able to recall why he went to the hospital with increased time. Of note pt with poor recall of bathroom DME and set up, but otherwise cognition appropriate. Patient endorses 10+ falls in the last 6 months due to dizziness and balance (per pt). He lives with his wife in a two story home, bed/bath upstairs. Independent with ADLs, IADLs, works full time at [employer].  Orthostatic vitals assessed, WFLs RN notified and in vitals flowsheets. Pt demonstrated bed mobility mod I, sit <> stand transfers with and without RW supervision/CGA. Ambulated ~138ft with RW and without RW. Ambulated ~38ft without RW, overall steady, but decreased gait velocity noted, very mild weaving of gait noted. With RW pt steady, safe and improved gait velocity as well. Pt educated about use of RW at home to ensure safety, pt resistant to this idea. Though pt may be nearing baseline level of functioning, the patient would benefit from further skilled PT to further assess dizziness symptoms, balance retraining, and decrease risk of falls. Recommendation is HHPT.   Of note, during first 170ft of ambulation, spO2 readings indicating ~mid 80s, though reading questionable, pt in NAD, no SOB noted. Improved >92% at end of ambulation and sitting in chair. RN notified.     Follow Up  Recommendations Home health PT    Equipment Recommendations  None recommended by PT(Pt has RW and cane at home)    Recommendations for Other Services       Precautions / Restrictions Precautions Precautions: Fall Restrictions Weight Bearing Restrictions: No      Mobility  Bed Mobility Overal bed mobility: Modified Independent                Transfers Overall transfer level: Needs assistance Equipment used: Rolling walker (2 wheeled);None Transfers: Sit to/from Stand Sit to Stand: Supervision;Min guard            Ambulation/Gait Ambulation/Gait assistance: Min guard;Supervision Gait Distance (Feet): 180 Feet Assistive device: Rolling walker (2 wheeled);None       General Gait Details: Ambulated ~78ft without RW, overall steady, but decreased gait velocity noted, very mild weaving of gait noted. With RW pt steady, safe and improved gait velocity as well (~13ft).  Stairs            Wheelchair Mobility    Modified Rankin (Stroke Patients Only)       Balance Overall balance assessment: Needs assistance;History of Falls Sitting-balance support: Feet supported Sitting balance-Leahy Scale: Good       Standing balance-Leahy Scale: Fair                               Pertinent Vitals/Pain Pain Assessment: No/denies pain    Home Living Family/patient expects to be discharged to:: Private residence Living Arrangements: Spouse/significant other Available Help at Discharge: Available 24 hours/day(wife works from home now) Type of Home: House Home Access: Stairs to enter Entrance Stairs-Rails: None  Entrance Stairs-Number of Steps: 3 Home Layout: Two level;Bed/bath upstairs Home Equipment: Walker - 2 wheels;Cane - single point;Shower seat Additional Comments: Pt not using equipment at baseline.    Prior Function Level of Independence: Independent         Comments: Pt working full time at [employer] Pt with decreased ability to  recall bathroom equipment/set up, information verified from previous admissions.     Hand Dominance   Dominant Hand: Right    Extremity/Trunk Assessment   Upper Extremity Assessment Upper Extremity Assessment: Generalized weakness    Lower Extremity Assessment Lower Extremity Assessment: Generalized weakness       Communication   Communication: No difficulties  Cognition Arousal/Alertness: Awake/alert Behavior During Therapy: WFL for tasks assessed/performed Overall Cognitive Status: No family/caregiver present to determine baseline cognitive functioning                                 General Comments: Pt with decreased recall of home environment, mild deficits in memory may be present. A&Ox4       General Comments      Exercises Other Exercises Other Exercises: Of note, during first 155ft of ambulation, spO2 readings indicating ~mid 80s, though reading questionable. Improved >92% at end of ambulation and sitting in chair. RN notified.   Assessment/Plan    PT Assessment Patient needs continued PT services  PT Problem List Decreased strength;Decreased range of motion;Decreased balance;Decreased mobility;Decreased knowledge of precautions;Decreased safety awareness       PT Treatment Interventions DME instruction;Stair training;Therapeutic activities;Balance training;Gait training;Functional mobility training;Therapeutic exercise;Neuromuscular re-education;Patient/family education    PT Goals (Current goals can be found in the Care Plan section)  Acute Rehab PT Goals Patient Stated Goal: to stop falling PT Goal Formulation: With patient Time For Goal Achievement: 01/07/19 Potential to Achieve Goals: Good Additional Goals Additional Goal #1: Patient will demonstrate improved higher level balance activities with supervision-mod I to address deficits in balance and decrease risk of falls.    Frequency Min 2X/week   Barriers to discharge         Co-evaluation               AM-PAC PT "6 Clicks" Mobility  Outcome Measure Help needed turning from your back to your side while in a flat bed without using bedrails?: A Little Help needed moving from lying on your back to sitting on the side of a flat bed without using bedrails?: A Little Help needed moving to and from a bed to a chair (including a wheelchair)?: A Little Help needed standing up from a chair using your arms (e.g., wheelchair or bedside chair)?: A Little Help needed to walk in hospital room?: A Little Help needed climbing 3-5 steps with a railing? : A Little 6 Click Score: 18    End of Session Equipment Utilized During Treatment: Gait belt Activity Tolerance: Patient tolerated treatment well Patient left: in chair;with chair alarm set;with call bell/phone within reach Nurse Communication: Mobility status PT Visit Diagnosis: Other abnormalities of gait and mobility (R26.89);Difficulty in walking, not elsewhere classified (R26.2);Repeated falls (R29.6);Dizziness and giddiness (R42)    Time: 1340-1416 PT Time Calculation (min) (ACUTE ONLY): 36 min   Charges:   PT Evaluation $PT Eval Moderate Complexity: 1 Mod PT Treatments $Therapeutic Exercise: 8-22 mins        Lieutenant Diego PT, DPT 2:42 PM,12/24/18 [phone]

## 2018-12-24 NOTE — ED Triage Notes (Signed)
Per EMS pt was at work at [employer] and someone found him confused and slow to respond. Pt with a hx of DM. Glucose 340 on arrival

## 2018-12-24 NOTE — TOC Initial Note (Signed)
Transition of Care [facility]) - Initial/Assessment Note    Patient Details  Name: Patrick Turner MRN: [id number] Date of Birth: [birth year]  [facility] CM/SW Contact:    Su Hilt, RN Phone Number: 12/24/2018, 3:22 PM  Clinical Narrative:                 Met with the patient to determine needs and DC plan,  Then patient stated that he lives at home with his wife, he has a cane and walker to use when needed.  He still works and drives, he sees no need for Home PT, The patient was alert and oriented X 4 however seemed to struggle thinking of the correct word to use.  Notified the Physician  The patient stated that he didn't have any needs at home at this time  Expected Discharge Plan: [place] Barriers to Discharge: Continued Medical Work up   Patient Goals and CMS Choice Patient states their goals for this hospitalization and ongoing recovery are:: go home CMS Medicare.gov Compare Post Acute Care list provided to:: Patient Choice offered to / list presented to : Patient  Expected Discharge Plan and Services Expected Discharge Plan: [place]   Discharge Planning Services: CM Consult Post Acute Care Choice: [place] arrangements for the past 2 months: Single Family Home                          Prior Living Arrangements/Services Living arrangements for the past 2 months: Single Family Home Lives with:: Spouse Patient language and need for interpreter reviewed:: No Do you feel safe going back to the place where you live?: Yes      Need for Family Participation in Patient Care: No (Comment)   Current home services: (none) Criminal Activity/Legal Involvement Pertinent to Current Situation/Hospitalization: No - Comment as needed  Activities of Daily Living Home Assistive Devices/Equipment: None ADL Screening (condition at time of admission) Patient's cognitive ability adequate to safely complete daily  activities?: Yes Is the patient deaf or have difficulty hearing?: No Does the patient have difficulty seeing, even when wearing glasses/contacts?: No Does the patient have difficulty concentrating, remembering, or making decisions?: No Patient able to express need for assistance with ADLs?: Yes Does the patient have difficulty dressing or bathing?: No Independently performs ADLs?: Yes (appropriate for developmental age) Does the patient have difficulty walking or climbing stairs?: No Weakness of Legs: None Weakness of Arms/Hands: None  Permission Sought/Granted                  Emotional Assessment Appearance:: Appears stated age Attitude/Demeanor/Rapport: Engaged Affect (typically observed): Accepting Orientation: : Oriented to Self, Oriented to Place, Oriented to  Time, Oriented to Situation Alcohol / Substance Use: Never Used Psych Involvement: No (comment)  Admission diagnosis:  Near syncope [R55] Patient Active Problem List   Diagnosis Date Noted  . AMS (altered mental status) 12/24/2018  . Dizziness 06/30/2018  . Diabetes ([place]) 02/23/2018  . HTN (hypertension) 02/23/2018  . HLD (hyperlipidemia) 02/23/2018  . GERD (gastroesophageal reflux disease) 02/23/2018  . TIA (transient ischemic attack) 07/12/2016  . 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 Acute diarrhea 05/30/2016  . Acute encephalopathy 05/30/2016  . Anemia 05/30/2016  . Acute on chronic renal insufficiency 05/30/2016  . Diarrhea 05/30/2016  . Unspecified transient cerebral ischemia 05/29/2012   PCP:  Kirk Ruths, MD Pharmacy:   [id number]  [place], [place] [address] Phone: [phone] Fax: [phone]     Social Determinants of Health (SDOH) Interventions    Readmission Risk Interventions No flowsheet data found.

## 2018-12-24 NOTE — ED Notes (Signed)
This EDT ambulated pt per verbal of Cinda Quest, MD,. Pt had a somewhat unsteady gait, when pt asked if he felt dizziness, he responded "Yes, a do feel a little dizzy". Once back in rm, on bed, pt was asked the same question about dizziness and gave the same answer. Danise Mina RN and  Cinda Quest MD notified

## 2018-12-24 NOTE — Progress Notes (Signed)
Took a CPAP into patient's room for tonight, patient stated "that he has not worn one of those masks in years and will not wear it tonight." Patient refused CPAP.

## 2018-12-24 NOTE — Progress Notes (Addendum)
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  He is not confused in the office and medication compliant.    Waylan Boga, PMHNP

## 2018-12-25 DIAGNOSIS — G9340 Encephalopathy, unspecified: Secondary | ICD-10-CM | POA: Diagnosis not present

## 2018-12-25 DIAGNOSIS — F319 Bipolar disorder, unspecified: Secondary | ICD-10-CM | POA: Diagnosis not present

## 2018-12-25 DIAGNOSIS — G4733 Obstructive sleep apnea (adult) (pediatric): Secondary | ICD-10-CM | POA: Diagnosis not present

## 2018-12-25 DIAGNOSIS — E119 Type 2 diabetes mellitus without complications: Secondary | ICD-10-CM | POA: Diagnosis not present

## 2018-12-25 LAB — HEMOGLOBIN A1C
Hgb A1c MFr Bld: 9.3 % — ABNORMAL HIGH (ref 4.8–5.6)
Mean Plasma Glucose: 220 mg/dL

## 2018-12-25 LAB — GLUCOSE, CAPILLARY: Glucose-Capillary: 147 mg/dL — ABNORMAL HIGH (ref 70–99)

## 2018-12-25 LAB — RPR: RPR Ser Ql: NONREACTIVE

## 2018-12-25 MED ORDER — LEVEMIR 100 UNIT/ML ~~LOC~~ SOLN: 20.0000 [IU] | Freq: Every day | SUBCUTANEOUS | 3 refills | Status: DC

## 2018-12-25 NOTE — Discharge Summary (Signed)
[facility] NAME: Percival Glasheen    MR#:  [id number]  DATE OF BIRTH:  1946-04-25  DATE OF ADMISSION:  12/24/2018 ADMITTING PHYSICIAN: Gorden Harms, MD  DATE OF DISCHARGE: No discharge date for patient encounter.  PRIMARY CARE PHYSICIAN: Kirk Ruths, MD    ADMISSION DIAGNOSIS:  Near syncope [R55]  DISCHARGE DIAGNOSIS:  Active Problems:   AMS (altered mental status)   SECONDARY DIAGNOSIS:   Past Medical History:  Diagnosis Date  . Altered mental status   . Anemia   . Anxiety   . Aortic atherosclerosis ([place])   . Arthritis   . Coronary artery disease   . CRD (chronic renal disease)   . Depression   . Diabetes mellitus without complication ([place])   . Encephalopathy acute   . GERD (gastroesophageal reflux disease)   . Hypercholesteremia   . Hypertension   . Neuropathy   . Severe obesity ([place])   . Sleep apnea   . 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 compounded by polypharmacy with history of bipolar illness Patient was referred to observation unit, unnecessary psychotropic meds were held, CT head/MRI of the brain was negative, ammonia level was negative, RPR negative, UDS negative, provided gentle IV fluids for rehydration, patient evaluated by psychiatry for mood disorder while in house, per wife-patient typically becomes more altered/confused when blood sugar is uncontrolled  *Acute hyperglycemia with chronic diabetes mellitus type 2 Resolved Hemoglobin A1c 9.3 consistent with uncontrolled diabetes Continue home regiment with  exception of metformin while in house, placed on high-dose sliding scale insulin with Accu-Cheks per routine, heart healthy/carbohydrate consistent diet, Lantus home dose was increased to 20 units at bedtime, patient encouraged to adhere to carbohydrate consistent diet, will refer patient to primary care provider status post discharge in 2 to 3 days for reevaluation  *Chronic bipolar illness Stable Psychiatry did see patient while in house, continued on Paxil, Seroquel, and anxiolytics  *Chronic hyperlipidemia, unspecified Continued statin therapy  *Chronic obstructive sleep apnea CPAP at bedtime/as needed  *Chronic hypertension Stable Continued Zestoretic, PRN hydralazine  DISCHARGE CONDITIONS:  stable  CONSULTS OBTAINED:  Treatment Team:  Threasa Heads D, PA  DRUG ALLERGIES:   Allergies  Allergen Reactions  . Penicillins Anaphylaxis    Has patient had a PCN reaction causing immediate rash, facial/tongue/throat swelling, SOB or lightheadedness with hypotension: Yes Has patient had a PCN reaction causing severe rash involving mucus membranes or skin necrosis: No Has patient had a PCN reaction that required hospitalization Yes Has patient had a PCN reaction occurring within the last 10 years: No If all of the above answers are "NO", then may proceed with Cephalosporin use.  . Tavist [Clemastine] Swelling    DISCHARGE MEDICATIONS:   Allergies as of 12/25/2018      Reactions   Penicillins Anaphylaxis   Has patient had a PCN reaction causing immediate rash, facial/tongue/throat swelling, SOB or lightheadedness with hypotension: Yes Has patient had a PCN reaction causing severe rash involving mucus membranes or skin necrosis: No Has patient had a PCN reaction that required hospitalization Yes Has patient had a PCN reaction occurring within the last  10 years: No If all of the above answers are "NO", then may proceed with Cephalosporin use.   Tavist [clemastine] Swelling       Medication List    TAKE these medications   ACCU-CHEK AVIVA PLUS VI by In Vitro route.   aspirin 81 MG tablet Take 1 tablet (81 mg total) by mouth daily.   atorvastatin 80 MG tablet Commonly known as:  LIPITOR Take 80 mg by mouth at bedtime.   clonazePAM 1 MG tablet Commonly known as:  KLONOPIN Take 0.5 mg by mouth at bedtime as needed for anxiety.   Fish Oil 1000 MG Caps Take 1 capsule by mouth daily.   gabapentin 400 MG capsule Commonly known as:  NEURONTIN Take 400 mg by mouth 2 (two) times daily.   gabapentin 100 MG capsule Commonly known as:  NEURONTIN Take 2 capsules (200 mg total) by mouth 3 (three) times daily.   Levemir 100 UNIT/ML injection Generic drug:  insulin detemir Inject 0.2 mLs (20 Units total) into the skin at bedtime. What changed:    how much to take  when to take this   lisinopril-hydrochlorothiazide 20-12.5 MG tablet Commonly known as:  PRINZIDE,ZESTORETIC Take 1 tablet by mouth daily.   meclizine 25 MG tablet Commonly known as:  ANTIVERT Take 1 tablet (25 mg total) by mouth 3 (three) times daily as needed for dizziness.   metFORMIN 500 MG 24 hr tablet Commonly known as:  GLUCOPHAGE-XR Take 1,000 mg by mouth 2 (two) times daily.   omeprazole 40 MG capsule Commonly known as:  PRILOSEC Take 40 mg by mouth daily.   PARoxetine 40 MG tablet Commonly known as:  PAXIL Take 40 mg by mouth daily.   pioglitazone 15 MG tablet Commonly known as:  ACTOS Take 15 mg by mouth daily.   QUEtiapine 100 MG tablet Commonly known as:  SEROQUEL Take 100 mg by mouth at bedtime.   topiramate 100 MG tablet Commonly known as:  TOPAMAX Take 100 mg by mouth 2 (two) times daily.   vitamin B-12 1000 MCG tablet Commonly known as:  CYANOCOBALAMIN Take 1,000 mcg by mouth daily.        DISCHARGE INSTRUCTIONS:    If you experience worsening of your admission symptoms, develop shortness of breath, life threatening emergency, suicidal or  homicidal thoughts you must seek medical attention immediately by calling 911 or calling your MD immediately  if symptoms less severe.  You Must read complete instructions/literature along with all the possible adverse reactions/side effects for all the Medicines you take and that have been prescribed to you. Take any new Medicines after you have completely understood and accept all the possible adverse reactions/side effects.   Please note  You were cared for by a hospitalist during your hospital stay. If you have any questions about your discharge medications or the care you received while you were in the hospital after you are discharged, you can call the unit and asked to speak with the hospitalist on call if the hospitalist that took care of you is not available. Once you are discharged, your primary care physician will handle any further medical issues. Please note that NO REFILLS for any discharge medications will be authorized once you are discharged, as it is imperative that you return to your primary care physician (or establish a relationship with a primary care physician if you do not have one) for your aftercare needs so that they can reassess your need for medications and monitor your lab  values.    Today   CHIEF COMPLAINT:   Chief Complaint  Patient presents with  . Hyperglycemia    HISTORY OF PRESENT ILLNESS:  73 y.o. male with a known history per below which includes organic brain disease, drug-induced Parkinson's, bipolar illness, multiple hospitals with documentation for acute altered mental status/encephalopathy, discharged from our hospital at the end of last year asked extensive neurovascular work-up which was unimpressive, presents to the emergency room with altered mental status, acute confusion, noted abnormal gait, ?  Near fall, dizziness while at work, patient was brought to the emergency room for further evaluation/care, ER work-up was largely unimpressive, CT head/MRI  of the brain negative for any acute process, UA negative, patient evaluated in the emergency room, no apparent distress, resting comfortably in bed, is a poor historian, but denies pain, no other family available, patient is now been admitted for acute recurrent toxic metabolic encephalopathy suspect due to organic brain disease compounded by polypharmacy with history of bipolar illness.  VITAL SIGNS:  Blood pressure (!) 123/58, pulse 73, temperature 97.8 F (36.6 C), resp. rate 18, height 5\' 8"  (1.727 m), weight 90.8 kg, SpO2 98 %.  I/O:    Intake/Output Summary (Last 24 hours) at 12/25/2018 0925 Last data filed at 12/24/2018 1843 Gross per 24 hour  Intake 240 ml  Output -  Net 240 ml    PHYSICAL EXAMINATION:  GENERAL:  73 y.o.-year-old patient lying in the bed with no acute distress.  EYES: Pupils equal, round, reactive to light and accommodation. No scleral icterus. Extraocular muscles intact.  HEENT: Head atraumatic, normocephalic. Oropharynx and nasopharynx clear.  NECK:  Supple, no jugular venous distention. No thyroid enlargement, no tenderness.  LUNGS: Normal breath sounds bilaterally, no wheezing, rales,rhonchi or crepitation. No use of accessory muscles of respiration.  CARDIOVASCULAR: S1, S2 normal. No murmurs, rubs, or gallops.  ABDOMEN: Soft, non-tender, non-distended. Bowel sounds present. No organomegaly or mass.  EXTREMITIES: No pedal edema, cyanosis, or clubbing.  NEUROLOGIC: Cranial nerves II through XII are intact. Muscle strength 5/5 in all extremities. Sensation intact. Gait not checked.  PSYCHIATRIC: The patient is alert and oriented x 3.  SKIN: No obvious rash, lesion, or ulcer.   DATA REVIEW:   CBC Recent Labs  Lab 12/24/18 0105  WBC 6.0  HGB 8.6*  HCT 28.5*  PLT 244    Chemistries  Recent Labs  Lab 12/24/18 0105  NA 137  K 4.4  CL 109  CO2 22  GLUCOSE 379*  BUN 28*  CREATININE 1.49*  CALCIUM 8.3*  AST 20  ALT 14  ALKPHOS 75  BILITOT 0.4     Cardiac Enzymes Recent Labs  Lab 12/24/18 0228  TROPONINI <0.03    Microbiology Results  Results for orders placed or performed during the hospital encounter of 04/19/18  C difficile quick scan w PCR reflex     Status: None   Collection Time: 04/19/18  8:35 AM  Result Value Ref Range Status   C Diff antigen NEGATIVE NEGATIVE Final   C Diff toxin NEGATIVE NEGATIVE Final   C Diff interpretation No C. difficile detected.  Final    Comment: Performed at [facility], [address] [place], [place] [address]  Gastrointestinal Panel by PCR , Stool     Status: None   Collection Time: 04/19/18  8:35 AM  Result Value Ref Range Status   Campylobacter species NOT DETECTED NOT DETECTED Final   Plesimonas shigelloides NOT DETECTED NOT DETECTED Final  Salmonella species NOT DETECTED NOT DETECTED Final   Yersinia enterocolitica NOT DETECTED NOT DETECTED Final   Vibrio species NOT DETECTED NOT DETECTED Final   Vibrio cholerae NOT DETECTED NOT DETECTED Final   Enteroaggregative E coli (EAEC) NOT DETECTED NOT DETECTED Final   Enteropathogenic E coli (EPEC) NOT DETECTED NOT DETECTED Final   Enterotoxigenic E coli (ETEC) NOT DETECTED NOT DETECTED Final   Shiga like toxin producing E coli (STEC) NOT DETECTED NOT DETECTED Final   Shigella/Enteroinvasive E coli (EIEC) NOT DETECTED NOT DETECTED Final   Cryptosporidium NOT DETECTED NOT DETECTED Final   Cyclospora cayetanensis NOT DETECTED NOT DETECTED Final   Entamoeba histolytica NOT DETECTED NOT DETECTED Final   Giardia lamblia NOT DETECTED NOT DETECTED Final   Adenovirus F40/41 NOT DETECTED NOT DETECTED Final   Astrovirus NOT DETECTED NOT DETECTED Final   Norovirus GI/GII NOT DETECTED NOT DETECTED Final   Rotavirus A NOT DETECTED NOT DETECTED Final   Sapovirus (I, II, IV, and V) NOT DETECTED NOT DETECTED Final    Comment: Performed at [facility], [address] [place], [place] [address]    RADIOLOGY:  Ct Head Wo  Contrast  Result Date: 12/24/2018 CLINICAL DATA:  Confusion and elevated glucose level EXAM: CT HEAD WITHOUT CONTRAST TECHNIQUE: Contiguous axial images were obtained from the base of the skull through the vertex without intravenous contrast. COMPARISON:  06/30/2018 FINDINGS: Brain: Mild atrophic changes are noted. No findings to suggest acute hemorrhage, acute infarction or space-occupying mass lesion are noted. Vascular: No hyperdense vessel or unexpected calcification. Skull: Normal. Negative for fracture or focal lesion. Sinuses/Orbits: No acute finding. Other: None. IMPRESSION: Atrophic changes without acute intracranial abnormality noted. Electronically Signed   By: Inez Catalina M.D.   On: 12/24/2018 01:40   Mr Brain Wo Contrast  Result Date: 12/24/2018 CLINICAL DATA:  Altered level of consciousness. Diabetes. Hyperglycemia. EXAM: MRI HEAD WITHOUT CONTRAST TECHNIQUE: Multiplanar, multiecho pulse sequences of the brain and surrounding structures were obtained without intravenous contrast. COMPARISON:  CT head without contrast 12/24/2018.  MRI brain/19/19 FINDINGS: Brain: The diffusion-weighted images demonstrate no acute or subacute infarction. Mild atrophy is within normal limits for age. 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 Dilated perivascular spaces are present within the basal ganglia. Remote ischemic changes of the thalami are stable. The brainstem and cerebellum are within normal limits. Vascular: Flow is present in the major intracranial arteries. Skull and upper cervical spine: The craniocervical junction is normal. Upper cervical spine is within normal limits. Marrow signal is unremarkable. Sinuses/Orbits: Mild mucosal thickening is present in the ethmoid air cells bilaterally. There are no fluid levels. Minimal fluid is present in the mastoid air cells bilaterally. The paranasal sinuses are otherwise clear. The globes and orbits are within normal  limits. IMPRESSION: 1. Mild ischemic changes involving the basal ganglia and thalami are chronic. 2. No acute intracranial abnormality or focal lesion to explain the patient's acute symptoms. 3. Mild chronic ethmoid sinus disease. No significant acute disease. Electronically Signed   By: San Morelle M.D.   On: 12/24/2018 04:29   Dg Chest Portable 1 View  Result Date: 12/24/2018 CLINICAL DATA:  73 y/o  M; altered mental status. EXAM: PORTABLE CHEST 1 VIEW COMPARISON:  06/30/2018 chest radiograph. FINDINGS: Stable normal cardiac silhouette given projection and technique. Aortic calcific atherosclerosis. Low lung volumes accentuate pulmonary markings. No consolidation, effusion, or pneumothorax. Multilevel degenerative changes of the spine. Productive changes of right acromioclavicular joint. No  acute osseous abnormality is evident IMPRESSION: Low lung volumes. No acute pulmonary process identified. Electronically Signed   By: Kristine Garbe M.D.   On: 12/24/2018 01:29    EKG:   Orders placed or performed during the hospital encounter of 12/24/18  . EKG 12-Lead  . EKG 12-Lead  . ED EKG  . ED EKG  . EKG 12-Lead  . EKG 12-Lead      Management plans discussed with the patient, family and they are in agreement.  CODE STATUS:     Code Status Orders  (From admission, onward)         Start     Ordered   12/24/18 0948  Full code  Continuous     12/24/18 0947        Code Status History    Date Active Date Inactive Code Status Order ID Comments User Context   06/30/2018 1048 07/01/2018 1759 Full Code [id number]  Henreitta Leber, MD Inpatient   02/23/2018 0623 02/23/2018 2340 Full Code [id number]  Lance Coon, MD Inpatient   06/25/2017 0646 06/26/2017 1336 Full Code [id number]  Saundra Shelling, MD Inpatient   07/12/2016 2022 07/13/2016 2311 Full Code [id number]  Demetrios Loll, MD Inpatient   06/27/2016 1656 06/29/2016 2034 Full Code [id number]  Dereck Leep, MD Inpatient    05/30/2016 1639 06/01/2016 1501 Full Code [id number]  Theodoro Grist, MD Inpatient    Advance Directive Documentation     Most Recent Value  Type of Advance Directive  Healthcare Power of Attorney, Living will  Pre-existing out of facility DNR order (yellow form or pink MOST form)  -  "MOST" Form in Place?  -      TOTAL TIME TAKING CARE OF THIS PATIENT: 40 minutes.    Avel Peace  M.D on 12/25/2018 at 9:25 AM  Between 7am to 6pm - Pager - [phone]  After 6pm go to [email or web address] - password [email or web address] [facility]  Office  [phone]  CC: Primary care physician; Kirk Ruths, MD   Note: This dictation was prepared with Dragon dictation along with smaller phrase technology. Any transcriptional errors that result from this process are unintentional.

## 2018-12-25 NOTE — Progress Notes (Signed)
Patient discharged home per MD order. All discharge instructions given and all questions answered. 

## 2018-12-26 DIAGNOSIS — E1142 Type 2 diabetes mellitus with diabetic polyneuropathy: Secondary | ICD-10-CM | POA: Diagnosis not present

## 2018-12-26 DIAGNOSIS — E1165 Type 2 diabetes mellitus with hyperglycemia: Secondary | ICD-10-CM | POA: Diagnosis not present

## 2018-12-26 DIAGNOSIS — E1122 Type 2 diabetes mellitus with diabetic chronic kidney disease: Secondary | ICD-10-CM | POA: Diagnosis not present

## 2018-12-26 DIAGNOSIS — Z794 Long term (current) use of insulin: Secondary | ICD-10-CM | POA: Diagnosis not present

## 2018-12-26 DIAGNOSIS — N183 Chronic kidney disease, stage 3 (moderate): Secondary | ICD-10-CM | POA: Diagnosis not present

## 2018-12-26 DIAGNOSIS — E1159 Type 2 diabetes mellitus with other circulatory complications: Secondary | ICD-10-CM | POA: Diagnosis not present

## 2019-01-25 ENCOUNTER — Encounter: Payer: Self-pay | Admitting: Emergency Medicine

## 2019-01-25 ENCOUNTER — Emergency Department: Payer: Medicare HMO

## 2019-01-25 ENCOUNTER — Emergency Department
Admission: EM | Admit: 2019-01-25 | Discharge: 2019-01-25 | Disposition: A | Discharge status: Home / Self Care | Payer: Medicare HMO | Attending: Emergency Medicine | Admitting: Emergency Medicine

## 2019-01-25 ENCOUNTER — Other Ambulatory Visit: Payer: Self-pay

## 2019-01-25 DIAGNOSIS — K5792 Diverticulitis of intestine, part unspecified, without perforation or abscess without bleeding: Secondary | ICD-10-CM

## 2019-01-25 DIAGNOSIS — Z79899 Other long term (current) drug therapy: Secondary | ICD-10-CM | POA: Diagnosis not present

## 2019-01-25 DIAGNOSIS — I10 Essential (primary) hypertension: Secondary | ICD-10-CM | POA: Diagnosis not present

## 2019-01-25 DIAGNOSIS — E119 Type 2 diabetes mellitus without complications: Secondary | ICD-10-CM | POA: Insufficient documentation

## 2019-01-25 DIAGNOSIS — Z7984 Long term (current) use of oral hypoglycemic drugs: Secondary | ICD-10-CM | POA: Insufficient documentation

## 2019-01-25 DIAGNOSIS — R109 Unspecified abdominal pain: Secondary | ICD-10-CM | POA: Diagnosis not present

## 2019-01-25 DIAGNOSIS — Z8673 Personal history of transient ischemic attack (TIA), and cerebral infarction without residual deficits: Secondary | ICD-10-CM | POA: Insufficient documentation

## 2019-01-25 DIAGNOSIS — I2510 Atherosclerotic heart disease of native coronary artery without angina pectoris: Secondary | ICD-10-CM | POA: Insufficient documentation

## 2019-01-25 DIAGNOSIS — M25552 Pain in left hip: Secondary | ICD-10-CM | POA: Diagnosis not present

## 2019-01-25 DIAGNOSIS — Z7982 Long term (current) use of aspirin: Secondary | ICD-10-CM | POA: Insufficient documentation

## 2019-01-25 LAB — COMPREHENSIVE METABOLIC PANEL
ALT: 12 U/L (ref 0–44)
AST: 20 U/L (ref 15–41)
Albumin: 3.7 g/dL (ref 3.5–5.0)
Alkaline Phosphatase: 70 U/L (ref 38–126)
Anion gap: 9 (ref 5–15)
BUN: 29 mg/dL — ABNORMAL HIGH (ref 8–23)
CO2: 20 mmol/L — ABNORMAL LOW (ref 22–32)
Calcium: 8.4 mg/dL — ABNORMAL LOW (ref 8.9–10.3)
Chloride: 108 mmol/L (ref 98–111)
Creatinine, Ser: 1.25 mg/dL — ABNORMAL HIGH (ref 0.61–1.24)
GFR calc Af Amer: >60 mL/min (ref >60)
GFR calc non Af Amer: 57 mL/min — ABNORMAL LOW (ref >60)
Glucose, Bld: 262 mg/dL — ABNORMAL HIGH (ref 70–99)
Potassium: 4.2 mmol/L (ref 3.5–5.1)
Sodium: 137 mmol/L (ref 135–145)
Total Bilirubin: 0.3 mg/dL (ref 0.3–1.2)
Total Protein: 6.7 g/dL (ref 6.5–8.1)

## 2019-01-25 LAB — CBC
HCT: 29.8 % — ABNORMAL LOW (ref 39.0–52.0)
Hemoglobin: 8.7 g/dL — ABNORMAL LOW (ref 13.0–17.0)
MCH: 24.2 pg — ABNORMAL LOW (ref 26.0–34.0)
MCHC: 29.2 g/dL — ABNORMAL LOW (ref 30.0–36.0)
MCV: 83 fL (ref 80.0–100.0)
Platelets: 235 10*3/uL (ref 150–400)
RBC: 3.59 MIL/uL — ABNORMAL LOW (ref 4.22–5.81)
RDW: 17 % — ABNORMAL HIGH (ref 11.5–15.5)
WBC: 6.3 10*3/uL (ref 4.0–10.5)
nRBC: 0 % (ref 0.0–0.2)

## 2019-01-25 LAB — URINALYSIS, COMPLETE (UACMP) WITH MICROSCOPIC
Bacteria, UA: NONE SEEN
Bilirubin Urine: NEGATIVE
Glucose, UA: >=500 mg/dL — AB
Hgb urine dipstick: NEGATIVE
Ketones, ur: NEGATIVE mg/dL
Leukocytes,Ua: NEGATIVE
Nitrite: NEGATIVE
Protein, ur: NEGATIVE mg/dL
Specific Gravity, Urine: 1.027 (ref 1.005–1.030)
pH: 6 (ref 5.0–8.0)

## 2019-01-25 MED ORDER — CIPROFLOXACIN HCL 500 MG PO TABS: 500.0000 mg | ORAL_TABLET | Freq: Two times a day (BID) | ORAL | 0 refills | Status: AC

## 2019-01-25 MED ORDER — METRONIDAZOLE 500 MG PO TABS: 500.0000 mg | ORAL_TABLET | Freq: Three times a day (TID) | ORAL | 0 refills | Status: AC

## 2019-01-25 MED ORDER — IOHEXOL 300 MG/ML  SOLN
100.0000 mL | Freq: Once | INTRAMUSCULAR | Status: AC | PRN
  Administered 2019-01-25: 100 mL via INTRAVENOUS

## 2019-01-25 NOTE — ED Provider Notes (Signed)
-----------------------------------------   7:53 AM on 01/25/2019 -----------------------------------------  Patient care assumed from Dr. Owens Shark.  Patient's work-up shows anemia which appears to be chronic for the patient.  Patient CT scan appears to show diverticulitis.  We will cover with Cipro and Flagyl given penicillin allergy.  Patient agreeable to plan of care. CT also shows perinephric stranding, will obtain a urinalysis prior to discharge.  Urinalysis negative.  Urine culture has been sent.  We will cover with Cipro and Flagyl.  Patient will follow-up with his primary care doctor.   Harvest Dark, MD 01/25/19 [phone]

## 2019-01-25 NOTE — ED Notes (Signed)
NAD noted at time of D/C. Pt denies questions or concerns. Pt ambulatory to the lobby at this time. Pt refused wheelchair to the lobby at this time. EDP aware of BP at time of D/C, pt states has not had BP meds at this time. Pt instructed to take normal home meds when he gets home, states understanding.

## 2019-01-25 NOTE — ED Notes (Signed)
This RN to bedside to introduce self to patient. Pt resting in bed at this time. Pt c/o R sided abd pain now. Pt otherwise resting in bed with NAD noted at this time.

## 2019-01-25 NOTE — ED Provider Notes (Signed)
[facility] Emergency Department Provider Note    First MD Initiated Contact with Patient 01/25/19 [phone]     (approximate)  I have reviewed the triage vital signs and the nursing notes.   HISTORY  Chief Complaint Hip Pain    HPI Patrick Turner is a 73 y.o. male with below list of previous medical conditions presents to the emergency department secondary to 3-day history of left-sided abdominal pain that acutely worsened tonight while picking up something at work.  Patient states the object was only approximately 24 pounds in its an activity that he is done multiple times but that the pain preceeds at this.  Patient denies any fever no nausea or vomiting.  Patient denies any diarrhea constipation.  No symptoms.        Past Medical History:  Diagnosis Date   Altered mental status    Anemia    Anxiety    Aortic atherosclerosis (HCC)    Arthritis    Coronary artery disease    CRD (chronic renal disease)    Depression    Diabetes mellitus without complication (HCC)    Encephalopathy acute    GERD (gastroesophageal reflux disease)    Hypercholesteremia    Hypertension    Neuropathy    Severe obesity ([place])    Sleep apnea    Sleep terror    per patient this year per patient     Patient Active Problem List   Diagnosis Date Noted   AMS (altered mental status) 12/24/2018   Dizziness 06/30/2018   Diabetes ([place]) 02/23/2018   HTN (hypertension) 02/23/2018   HLD (hyperlipidemia) 02/23/2018   GERD (gastroesophageal reflux disease) 02/23/2018   TIA (transient ischemic attack) 07/12/2016   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   Acute diarrhea 05/30/2016   Acute encephalopathy 05/30/2016   Anemia 05/30/2016   Acute on chronic renal insufficiency 05/30/2016   Diarrhea 05/30/2016   Unspecified transient cerebral ischemia 05/29/2012    Past Surgical History:  Procedure Laterality Date   APPENDECTOMY      CIRCUMCISION     COLONOSCOPY     COLONOSCOPY WITH PROPOFOL N/A 07/10/2018   Procedure: COLONOSCOPY WITH PROPOFOL;  Surgeon: Lollie Sails, MD;  Location: [facility] ENDOSCOPY;  Service: Endoscopy;  Laterality: N/A;   ESOPHAGOGASTRODUODENOSCOPY     JOINT REPLACEMENT     KNEE ARTHROPLASTY Right 06/27/2016   Procedure: COMPUTER ASSISTED TOTAL KNEE ARTHROPLASTY;  Surgeon: Dereck Leep, MD;  Location: [facility] ORS;  Service: Orthopedics;  Laterality: Right;   KNEE ARTHROSCOPY Right    TONSILLECTOMY      Prior to Admission medications   Medication Sig Start Date End Date Taking? Authorizing Provider  aspirin 81 MG tablet Take 1 tablet (81 mg total) by mouth daily. 02/23/18   Henreitta Leber, MD  atorvastatin (LIPITOR) 80 MG tablet Take 80 mg by mouth at bedtime. 12/14/15   [provider]  clonazePAM (KLONOPIN) 1 MG tablet Take 0.5 mg by mouth at bedtime as needed for anxiety.  12/18/15   [provider]  gabapentin (NEURONTIN) 100 MG capsule Take 2 capsules (200 mg total) by mouth 3 (three) times daily. 07/01/18 08/30/18  Henreitta Leber, MD  gabapentin (NEURONTIN) 400 MG capsule Take 400 mg by mouth 2 (two) times daily.    [provider]  Glucose Blood (ACCU-CHEK AVIVA PLUS VI) by In Vitro route.    [provider]  LEVEMIR 100 UNIT/ML injection Inject 0.2 mLs (20 Units total) into the  skin at bedtime. 12/25/18   Salary, Avel Peace, MD  lisinopril-hydrochlorothiazide (PRINZIDE,ZESTORETIC) 20-12.5 MG per tablet Take 1 tablet by mouth daily.     [provider]  meclizine (ANTIVERT) 25 MG tablet Take 1 tablet (25 mg total) by mouth 3 (three) times daily as needed for dizziness. Patient not taking: Reported on 12/24/2018 07/01/18   Henreitta Leber, MD  metFORMIN (GLUCOPHAGE-XR) 500 MG 24 hr tablet Take 1,000 mg by mouth 2 (two) times daily.    [provider]  Omega-3 Fatty Acids (FISH OIL) 1000 MG CAPS Take 1 capsule by mouth daily.      [provider]  omeprazole (PRILOSEC) 40 MG capsule Take 40 mg by mouth daily.     [provider]  PARoxetine (PAXIL) 40 MG tablet Take 40 mg by mouth daily.     [provider]  pioglitazone (ACTOS) 15 MG tablet Take 15 mg by mouth daily.     [provider]  QUEtiapine (SEROQUEL) 100 MG tablet Take 100 mg by mouth at bedtime. 12/22/15   [provider]  topiramate (TOPAMAX) 100 MG tablet Take 100 mg by mouth 2 (two) times daily.    [provider]  vitamin B-12 (CYANOCOBALAMIN) 1000 MCG tablet Take 1,000 mcg by mouth daily.    [provider]    Allergies Penicillins and Tavist [clemastine]  Family History  Problem Relation Age of Onset   Diabetes Mother    Heart disease Mother    Cancer Father    COPD Father     Social History Social History   Tobacco Use   Smoking status: Never Smoker   Smokeless tobacco: Never Used  Substance Use Topics   Alcohol use: Yes    Comment: rare   Drug use: No    Review of Systems Constitutional: No fever/chills Eyes: No visual changes. ENT: No sore throat. Cardiovascular: Denies chest pain. Respiratory: Denies shortness of breath. Gastrointestinal: Left lower quadrant tenderness to palpation.  No nausea, no vomiting.  No diarrhea.  No constipation. Genitourinary: Negative for dysuria. Musculoskeletal: Negative for neck pain.  Negative for back pain. Integumentary: Negative for rash. Neurological: Negative for headaches, focal weakness or numbness.   ____________________________________________   PHYSICAL EXAM:  VITAL SIGNS: ED Triage Vitals  Enc Vitals Group     BP 01/25/19 0349 (!) 138/57     Pulse Rate 01/25/19 0349 80     Resp 01/25/19 0349 18     Temp 01/25/19 0349 97.7 F (36.5 C)     Temp Source 01/25/19 0349 Oral     SpO2 01/25/19 0349 100 %     Weight 01/25/19 0346 87.5 kg (193 lb)     Height 01/25/19 0346 1.651 m (5\' 5" )     Head Circumference  --      Peak Flow --      Pain Score 01/25/19 0346 4     Pain Loc --      Pain Edu? --      Excl. in [place]? --     Constitutional: Alert and oriented. Well appearing and in no acute distress. Eyes: Conjunctivae are normal.  Head: Atraumatic. Mouth/Throat: Mucous membranes are moist.  Oropharynx non-erythematous. Neck: No stridor.   Cardiovascular: Normal rate, regular rhythm. Good peripheral circulation. Grossly normal heart sounds. Respiratory: Normal respiratory effort.  No retractions. No audible wheezing. Gastrointestinal: Left upper quadrant/left lower quadrant tenderness to palpation.. No distention.  Musculoskeletal: No lower extremity tenderness nor edema. No gross deformities  of extremities. Neurologic:  Normal speech and language. No gross focal neurologic deficits are appreciated.  Skin:  Skin is warm, dry and intact. No rash noted. Psychiatric: Mood and affect are normal. Speech and behavior are normal.  ____________________________________________   LABS (all labs ordered are listed, but only abnormal results are displayed)  Labs Reviewed  CBC - Abnormal; Notable for the following components:      Result Value   RBC 3.59 (*)    Hemoglobin 8.7 (*)    HCT 29.8 (*)    MCH 24.2 (*)    MCHC 29.2 (*)    RDW 17.0 (*)    All other components within normal limits  COMPREHENSIVE METABOLIC PANEL - Abnormal; Notable for the following components:   CO2 20 (*)    Glucose, Bld 262 (*)    BUN 29 (*)    Creatinine, Ser 1.25 (*)    Calcium 8.4 (*)    GFR calc non Af Amer 57 (*)    All other components within normal limits     RADIOLOGY I, [place] Tyquan Carmickle, personally viewed and evaluated these images (plain radiographs) as part of my medical decision making, as well as reviewing the written report by the radiologist.  ED MD interpretation: Diverticulosis difficult to exclude mild diverticulitis on the proximal sigmoid colon.  Official radiology report(s): Ct Abdomen  Pelvis W Contrast  Result Date: 01/25/2019 CLINICAL DATA:  73 year old male with abdominal pain, possible diverticulitis. EXAM: CT ABDOMEN AND PELVIS WITH CONTRAST TECHNIQUE: Multidetector CT imaging of the abdomen and pelvis was performed using the standard protocol following bolus administration of intravenous contrast. CONTRAST:  162mL OMNIPAQUE IOHEXOL 300 MG/ML  SOLN COMPARISON:  None. FINDINGS: Lower chest: Mild cardiomegaly. Calcified coronary artery atherosclerosis. No pericardial or pleural effusion. Minor lung base atelectasis. Hepatobiliary: Negative noncontrast liver and gallbladder. Pancreas: Negative. Spleen: Negative. Adrenals/Urinary Tract: Negative adrenal glands. Symmetric renal enhancement and contrast excretion. There is mild bilateral perinephric fluid and/or stranding. No hydronephrosis. Decompressed ureters. Mildly distended urinary bladder, estimated volume 326 milliliters. Questionable mild bladder wall thickening but no perivesical stranding. Stomach/Bowel: Negative rectum. Mild to moderate diverticulosis at the distal descending colon and proximal sigmoid. However, no mesenteric stranding. There may be mild bowel wall thickening (coronal image 26). The more proximal descending colon is negative. Redundant splenic flexure. Retained stool in the transverse and right colon. Diminutive or absent appendix. No pericecal inflammation. Negative terminal ileum. No dilated small bowel. Decompressed stomach. No free air, free fluid. Vascular/Lymphatic: Aortoiliac calcified atherosclerosis. Major arterial structures are patent. Portal venous system appears grossly patent. There are venous collaterals in the proximal right thigh, and venous patency is not evaluated but the visible right femoral and iliac veins appear symmetric. Reproductive: Negative. Other: No pelvic free fluid. Musculoskeletal: No acute osseous abnormality identified. Advanced lumbar disc degeneration. No acute osseous abnormality  identified. IMPRESSION: 1. Difficult to exclude mild diverticulitis at the proximal sigmoid colon. No other large bowel inflammation. 2. Nonspecific perinephric stranding and/or fluid. No CT evidence of pyelonephritis, and no obstructive uropathy. There is mild bladder distension and questionable mild bladder wall thickening. 3. No other acute or inflammatory process identified in the abdomen or pelvis. 4. Calcified coronary artery and Aortic Atherosclerosis (ICD10-I70.0). Electronically Signed   By: Genevie Ann M.D.   On: 01/25/2019 07:18   Dg Hip Unilat W Or Wo Pelvis 2-3 Views Left  Result Date: 01/25/2019 CLINICAL DATA:  73 year old male with left hip pain after lifting. EXAM: DG HIP (WITH OR  WITHOUT PELVIS) 2-3V LEFT COMPARISON:  Pelvis radiographs 06/30/2018 and earlier. FINDINGS: Bone mineralization is within normal limits for age. Femoral heads remain normally located. The iliac wings are not entirely included but the visible pelvis appears stable and intact. Negative aside joints. Grossly intact proximal right femur. Intact proximal left femur. Calcified iliofemoral atherosclerosis. Incidental pelvic phleboliths. Negative visible bowel gas pattern. IMPRESSION: No acute osseous abnormality identified about the left hip or pelvis. Electronically Signed   By: Genevie Ann M.D.   On: 01/25/2019 05:47     Procedures   ____________________________________________   INITIAL IMPRESSION / MDM / ASSESSMENT AND PLAN / ED COURSE  As part of my medical decision making, I reviewed the following data within the electronic MEDICAL RECORD NUMBER   73 year old male presenting with above-stated history and physical exam concerning for possible diverticulosis/diverticulitis and as such CT of the abdomen was performed which confirmed suspicion of diverticulitis.  Patient be prescribed Augmentin for home   *Patrick Turner was evaluated in Emergency Department on 01/25/2019 for the symptoms described in the history of  present illness. He was evaluated in the context of the global COVID-19 pandemic, which necessitated consideration that the patient might be at risk for infection with the SARS-CoV-2 virus that causes COVID-19. Institutional protocols and algorithms that pertain to the evaluation of patients at risk for COVID-19 are in a state of rapid change based on information released by regulatory bodies including the CDC and federal and state organizations. These policies and algorithms were followed during the patient's care in the ED.*       ____________________________________________  FINAL CLINICAL IMPRESSION(S) / ED DIAGNOSES  Final diagnoses:  Acute diverticulitis     MEDICATIONS GIVEN DURING THIS VISIT:  Medications  iohexol (OMNIPAQUE) 300 MG/ML solution 100 mL (100 mLs Intravenous Contrast Given 01/25/19 0631)     ED Discharge Orders    None       Note:  This document was prepared using Dragon voice recognition software and may include unintentional dictation errors.   Gregor Hams, MD 01/25/19 [phone]

## 2019-01-25 NOTE — ED Notes (Signed)
Pt sitting up on side of bed talking on phone. Pt is is A&O x4, NAD noted. UA collected by this RN at this time.

## 2019-01-25 NOTE — ED Triage Notes (Addendum)
Patient ambulatory to triage with steady gait, without difficulty or distress noted; pt reports left hip pain after picking up a case of sugar at work [employer]--workers comp profile indicates drug screening only on request)

## 2019-01-26 LAB — URINE CULTURE: Culture: 30000 — AB

## 2019-01-29 DIAGNOSIS — R1032 Left lower quadrant pain: Secondary | ICD-10-CM | POA: Diagnosis not present

## 2019-03-04 DIAGNOSIS — E1122 Type 2 diabetes mellitus with diabetic chronic kidney disease: Secondary | ICD-10-CM | POA: Diagnosis not present

## 2019-03-04 DIAGNOSIS — E1165 Type 2 diabetes mellitus with hyperglycemia: Secondary | ICD-10-CM | POA: Diagnosis not present

## 2019-03-04 DIAGNOSIS — E1159 Type 2 diabetes mellitus with other circulatory complications: Secondary | ICD-10-CM | POA: Diagnosis not present

## 2019-03-04 DIAGNOSIS — E1142 Type 2 diabetes mellitus with diabetic polyneuropathy: Secondary | ICD-10-CM | POA: Diagnosis not present

## 2019-03-04 DIAGNOSIS — N183 Chronic kidney disease, stage 3 (moderate): Secondary | ICD-10-CM | POA: Diagnosis not present

## 2019-03-04 DIAGNOSIS — Z794 Long term (current) use of insulin: Secondary | ICD-10-CM | POA: Diagnosis not present

## 2019-03-12 DIAGNOSIS — H2512 Age-related nuclear cataract, left eye: Secondary | ICD-10-CM | POA: Diagnosis not present

## 2019-03-23 ENCOUNTER — Encounter: Payer: Self-pay | Admitting: Emergency Medicine

## 2019-03-23 ENCOUNTER — Other Ambulatory Visit: Payer: Self-pay

## 2019-03-23 ENCOUNTER — Observation Stay
Admission: EM | Admit: 2019-03-23 | Discharge: 2019-03-24 | Disposition: A | Discharge status: Home / Self Care | Payer: Medicare HMO | Attending: Internal Medicine | Admitting: Internal Medicine

## 2019-03-23 DIAGNOSIS — Z1159 Encounter for screening for other viral diseases: Secondary | ICD-10-CM | POA: Insufficient documentation

## 2019-03-23 DIAGNOSIS — K219 Gastro-esophageal reflux disease without esophagitis: Secondary | ICD-10-CM | POA: Insufficient documentation

## 2019-03-23 DIAGNOSIS — F419 Anxiety disorder, unspecified: Secondary | ICD-10-CM | POA: Diagnosis not present

## 2019-03-23 DIAGNOSIS — Z8673 Personal history of transient ischemic attack (TIA), and cerebral infarction without residual deficits: Secondary | ICD-10-CM | POA: Diagnosis not present

## 2019-03-23 DIAGNOSIS — D649 Anemia, unspecified: Secondary | ICD-10-CM | POA: Diagnosis not present

## 2019-03-23 DIAGNOSIS — I129 Hypertensive chronic kidney disease with stage 1 through stage 4 chronic kidney disease, or unspecified chronic kidney disease: Secondary | ICD-10-CM | POA: Diagnosis not present

## 2019-03-23 DIAGNOSIS — E785 Hyperlipidemia, unspecified: Secondary | ICD-10-CM | POA: Insufficient documentation

## 2019-03-23 DIAGNOSIS — N189 Chronic kidney disease, unspecified: Secondary | ICD-10-CM

## 2019-03-23 DIAGNOSIS — N179 Acute kidney failure, unspecified: Secondary | ICD-10-CM

## 2019-03-23 DIAGNOSIS — E860 Dehydration: Secondary | ICD-10-CM | POA: Insufficient documentation

## 2019-03-23 DIAGNOSIS — D631 Anemia in chronic kidney disease: Secondary | ICD-10-CM | POA: Insufficient documentation

## 2019-03-23 DIAGNOSIS — Z833 Family history of diabetes mellitus: Secondary | ICD-10-CM | POA: Insufficient documentation

## 2019-03-23 DIAGNOSIS — R112 Nausea with vomiting, unspecified: Secondary | ICD-10-CM | POA: Diagnosis not present

## 2019-03-23 DIAGNOSIS — Z8249 Family history of ischemic heart disease and other diseases of the circulatory system: Secondary | ICD-10-CM | POA: Insufficient documentation

## 2019-03-23 DIAGNOSIS — M1990 Unspecified osteoarthritis, unspecified site: Secondary | ICD-10-CM | POA: Insufficient documentation

## 2019-03-23 DIAGNOSIS — Z794 Long term (current) use of insulin: Secondary | ICD-10-CM | POA: Diagnosis not present

## 2019-03-23 DIAGNOSIS — E1140 Type 2 diabetes mellitus with diabetic neuropathy, unspecified: Secondary | ICD-10-CM | POA: Diagnosis not present

## 2019-03-23 DIAGNOSIS — N183 Chronic kidney disease, stage 3 (moderate): Secondary | ICD-10-CM | POA: Insufficient documentation

## 2019-03-23 DIAGNOSIS — F329 Major depressive disorder, single episode, unspecified: Secondary | ICD-10-CM | POA: Insufficient documentation

## 2019-03-23 DIAGNOSIS — Z888 Allergy status to other drugs, medicaments and biological substances status: Secondary | ICD-10-CM | POA: Insufficient documentation

## 2019-03-23 DIAGNOSIS — Z96651 Presence of right artificial knee joint: Secondary | ICD-10-CM | POA: Insufficient documentation

## 2019-03-23 DIAGNOSIS — E1122 Type 2 diabetes mellitus with diabetic chronic kidney disease: Secondary | ICD-10-CM | POA: Diagnosis not present

## 2019-03-23 DIAGNOSIS — D509 Iron deficiency anemia, unspecified: Secondary | ICD-10-CM | POA: Diagnosis not present

## 2019-03-23 DIAGNOSIS — I2510 Atherosclerotic heart disease of native coronary artery without angina pectoris: Secondary | ICD-10-CM | POA: Insufficient documentation

## 2019-03-23 DIAGNOSIS — Z880 Allergy status to penicillin: Secondary | ICD-10-CM | POA: Diagnosis not present

## 2019-03-23 DIAGNOSIS — Z7982 Long term (current) use of aspirin: Secondary | ICD-10-CM | POA: Diagnosis not present

## 2019-03-23 DIAGNOSIS — G4733 Obstructive sleep apnea (adult) (pediatric): Secondary | ICD-10-CM | POA: Insufficient documentation

## 2019-03-23 DIAGNOSIS — Z79899 Other long term (current) drug therapy: Secondary | ICD-10-CM | POA: Diagnosis not present

## 2019-03-23 DIAGNOSIS — R42 Dizziness and giddiness: Secondary | ICD-10-CM | POA: Diagnosis not present

## 2019-03-23 HISTORY — DX: Acute kidney failure, unspecified: N17.9

## 2019-03-23 HISTORY — DX: Acute kidney failure, unspecified: N18.9

## 2019-03-23 LAB — CBC
HCT: 30.7 % — ABNORMAL LOW (ref 39.0–52.0)
Hemoglobin: 9.3 g/dL — ABNORMAL LOW (ref 13.0–17.0)
MCH: 23.8 pg — ABNORMAL LOW (ref 26.0–34.0)
MCHC: 30.3 g/dL (ref 30.0–36.0)
MCV: 78.5 fL — ABNORMAL LOW (ref 80.0–100.0)
Platelets: 239 10*3/uL (ref 150–400)
RBC: 3.91 MIL/uL — ABNORMAL LOW (ref 4.22–5.81)
RDW: 17.9 % — ABNORMAL HIGH (ref 11.5–15.5)
WBC: 6.2 10*3/uL (ref 4.0–10.5)
nRBC: 0 % (ref 0.0–0.2)

## 2019-03-23 LAB — URINALYSIS, COMPLETE (UACMP) WITH MICROSCOPIC
Bilirubin Urine: NEGATIVE
Glucose, UA: NEGATIVE mg/dL
Hgb urine dipstick: NEGATIVE
Ketones, ur: NEGATIVE mg/dL
Leukocytes,Ua: NEGATIVE
Nitrite: NEGATIVE
Protein, ur: NEGATIVE mg/dL
Specific Gravity, Urine: 1.006 (ref 1.005–1.030)
Squamous Epithelial / LPF: NONE SEEN (ref 0–5)
pH: 5 (ref 5.0–8.0)

## 2019-03-23 LAB — COMPREHENSIVE METABOLIC PANEL
ALT: 14 U/L (ref 0–44)
AST: 17 U/L (ref 15–41)
Albumin: 4.1 g/dL (ref 3.5–5.0)
Alkaline Phosphatase: 54 U/L (ref 38–126)
Anion gap: 11 (ref 5–15)
BUN: 49 mg/dL — ABNORMAL HIGH (ref 8–23)
CO2: 20 mmol/L — ABNORMAL LOW (ref 22–32)
Calcium: 8.9 mg/dL (ref 8.9–10.3)
Chloride: 106 mmol/L (ref 98–111)
Creatinine, Ser: 2.3 mg/dL — ABNORMAL HIGH (ref 0.61–1.24)
GFR calc Af Amer: 31 mL/min — ABNORMAL LOW (ref >60)
GFR calc non Af Amer: 27 mL/min — ABNORMAL LOW (ref >60)
Glucose, Bld: 103 mg/dL — ABNORMAL HIGH (ref 70–99)
Potassium: 4.6 mmol/L (ref 3.5–5.1)
Sodium: 137 mmol/L (ref 135–145)
Total Bilirubin: 0.5 mg/dL (ref 0.3–1.2)
Total Protein: 7.3 g/dL (ref 6.5–8.1)

## 2019-03-23 LAB — TROPONIN I (HIGH SENSITIVITY): Troponin I (High Sensitivity): 6 ng/L (ref <18)

## 2019-03-23 LAB — GLUCOSE, CAPILLARY: Glucose-Capillary: 84 mg/dL (ref 70–99)

## 2019-03-23 LAB — SARS CORONAVIRUS 2 BY RT PCR (HOSPITAL ORDER, PERFORMED IN ~~LOC~~ HOSPITAL LAB): SARS Coronavirus 2: NEGATIVE

## 2019-03-23 LAB — LIPASE, BLOOD: Lipase: 23 U/L (ref 11–51)

## 2019-03-23 MED ORDER — ASPIRIN EC 81 MG PO TBEC
81.0000 mg | DELAYED_RELEASE_TABLET | Freq: Every day | ORAL | Status: DC
  Administered 2019-03-24: 81 mg via ORAL
  Filled 2019-03-23: qty 1

## 2019-03-23 MED ORDER — SODIUM CHLORIDE 0.9 % IV SOLN
INTRAVENOUS | Status: DC
  Administered 2019-03-23 – 2019-03-24 (×2): via INTRAVENOUS

## 2019-03-23 MED ORDER — ACETAMINOPHEN 325 MG PO TABS: 650.0000 mg | ORAL_TABLET | Freq: Four times a day (QID) | ORAL | Status: DC | PRN

## 2019-03-23 MED ORDER — OMEGA-3-ACID ETHYL ESTERS 1 G PO CAPS
1.0000 g | ORAL_CAPSULE | Freq: Every day | ORAL | Status: DC
  Administered 2019-03-24: 10:00:00 1 g via ORAL
  Filled 2019-03-23: qty 1

## 2019-03-23 MED ORDER — TOPIRAMATE 100 MG PO TABS
100.0000 mg | ORAL_TABLET | Freq: Every day | ORAL | Status: DC
  Administered 2019-03-24: 100 mg via ORAL
  Filled 2019-03-23 (×2): qty 1

## 2019-03-23 MED ORDER — ACETAMINOPHEN 650 MG RE SUPP: 650.0000 mg | Freq: Four times a day (QID) | RECTAL | Status: DC | PRN

## 2019-03-23 MED ORDER — ONDANSETRON HCL 4 MG PO TABS: 4.0000 mg | ORAL_TABLET | Freq: Four times a day (QID) | ORAL | Status: DC | PRN

## 2019-03-23 MED ORDER — PAROXETINE HCL 20 MG PO TABS
40.0000 mg | ORAL_TABLET | Freq: Every day | ORAL | Status: DC
  Administered 2019-03-23: 22:00:00 40 mg via ORAL
  Filled 2019-03-23 (×2): qty 2

## 2019-03-23 MED ORDER — INSULIN ASPART 100 UNIT/ML ~~LOC~~ SOLN: 0.0000 [IU] | Freq: Every day | SUBCUTANEOUS | Status: DC

## 2019-03-23 MED ORDER — INSULIN ASPART 100 UNIT/ML ~~LOC~~ SOLN: 0.0000 [IU] | Freq: Three times a day (TID) | SUBCUTANEOUS | Status: DC

## 2019-03-23 MED ORDER — ATORVASTATIN CALCIUM 20 MG PO TABS
80.0000 mg | ORAL_TABLET | Freq: Every day | ORAL | Status: DC
  Administered 2019-03-23: 80 mg via ORAL
  Filled 2019-03-23: qty 4

## 2019-03-23 MED ORDER — SODIUM CHLORIDE 0.9% FLUSH: 3.0000 mL | Freq: Once | INTRAVENOUS | Status: DC

## 2019-03-23 MED ORDER — ONDANSETRON HCL 4 MG/2ML IJ SOLN: 4.0000 mg | Freq: Four times a day (QID) | INTRAMUSCULAR | Status: DC | PRN

## 2019-03-23 MED ORDER — QUETIAPINE FUMARATE 25 MG PO TABS
100.0000 mg | ORAL_TABLET | Freq: Every day | ORAL | Status: DC
  Administered 2019-03-23: 100 mg via ORAL
  Filled 2019-03-23: qty 4

## 2019-03-23 MED ORDER — INSULIN DETEMIR 100 UNIT/ML ~~LOC~~ SOLN
15.0000 [IU] | Freq: Every day | SUBCUTANEOUS | Status: DC
  Administered 2019-03-23: 15 [IU] via SUBCUTANEOUS
  Filled 2019-03-23 (×2): qty 0.15

## 2019-03-23 MED ORDER — GABAPENTIN 400 MG PO CAPS
400.0000 mg | ORAL_CAPSULE | Freq: Two times a day (BID) | ORAL | Status: DC
  Administered 2019-03-23 – 2019-03-24 (×2): 400 mg via ORAL
  Filled 2019-03-23 (×2): qty 1

## 2019-03-23 MED ORDER — PIOGLITAZONE HCL 15 MG PO TABS
15.0000 mg | ORAL_TABLET | Freq: Every day | ORAL | Status: DC
  Administered 2019-03-23: 15 mg via ORAL
  Filled 2019-03-23 (×2): qty 1

## 2019-03-23 MED ORDER — SODIUM CHLORIDE 0.9 % IV SOLN
Freq: Once | INTRAVENOUS | Status: AC
  Administered 2019-03-23: 17:00:00 via INTRAVENOUS

## 2019-03-23 MED ORDER — VITAMIN B-12 1000 MCG PO TABS
1000.0000 ug | ORAL_TABLET | Freq: Every day | ORAL | Status: DC
  Administered 2019-03-24: 1000 ug via ORAL
  Filled 2019-03-23: qty 1

## 2019-03-23 MED ORDER — PANTOPRAZOLE SODIUM 40 MG PO TBEC
40.0000 mg | DELAYED_RELEASE_TABLET | Freq: Every day | ORAL | Status: DC
  Administered 2019-03-24: 40 mg via ORAL
  Filled 2019-03-23: qty 1

## 2019-03-23 MED ORDER — CLONAZEPAM 0.5 MG PO TABS
0.5000 mg | ORAL_TABLET | Freq: Every evening | ORAL | Status: DC | PRN
  Administered 2019-03-23: 0.5 mg via ORAL
  Filled 2019-03-23: qty 1

## 2019-03-23 MED ORDER — HEPARIN SODIUM (PORCINE) 5000 UNIT/ML IJ SOLN
5000.0000 [IU] | Freq: Three times a day (TID) | INTRAMUSCULAR | Status: DC
  Administered 2019-03-23 – 2019-03-24 (×2): 5000 [IU] via SUBCUTANEOUS
  Filled 2019-03-23 (×2): qty 1

## 2019-03-23 NOTE — ED Notes (Signed)
ED TO INPATIENT HANDOFF REPORT  ED Nurse Name and Phone #: Rylyn Ranganathan [phone]  S Name/Age/Gender Patrick Turner 73 y.o. male Room/Bed: ED15A/ED15A  Code Status   Code Status: Prior  Home/SNF/Other Home Patient oriented to: a&ox2-3 Is this baseline? Yes   Triage Complete: Triage complete  Chief Complaint dizziness  Triage Note States has had dizziness x months. States got worse 3 days ago making it difficult to walk. Denies fall or injury.    Allergies Allergies  Allergen Reactions  . Penicillins Anaphylaxis    Has patient had a PCN reaction causing immediate rash, facial/tongue/throat swelling, SOB or lightheadedness with hypotension: Yes Has patient had a PCN reaction causing severe rash involving mucus membranes or skin necrosis: No Has patient had a PCN reaction that required hospitalization Yes Has patient had a PCN reaction occurring within the last 10 years: No If all of the above answers are "NO", then may proceed with Cephalosporin use.  . Clemastine Swelling  . Loratadine     Level of Care/Admitting Diagnosis ED Disposition    ED Disposition Condition [facility] Area: [place] [[id number]]  Level of Care: Med-Surg [16]  Covid Evaluation: Confirmed COVID Negative  Diagnosis: Acute on chronic renal failure [facility]) [[id number]]  Admitting Physician: Henreitta Leber [[id number]]  Attending Physician: Henreitta Leber [[id number]]  PT Class (Do Not Modify): Observation [104]  PT Acc Code (Do Not Modify): Observation [10022]       B Medical/Surgery History Past Medical History:  Diagnosis Date  . Altered mental status   . Anemia   . Anxiety   . Aortic atherosclerosis ([place])   . Arthritis   . Coronary artery disease   . CRD (chronic renal disease)   . Depression   . Diabetes mellitus without complication ([place])   . Encephalopathy acute   . GERD (gastroesophageal reflux disease)   . Hypercholesteremia   . Hypertension   .  Neuropathy   . Severe obesity ([place])   . Sleep apnea   . Sleep terror    per patient this year per patient    Past Surgical History:  Procedure Laterality Date  . APPENDECTOMY    . CIRCUMCISION    . COLONOSCOPY    . COLONOSCOPY WITH PROPOFOL N/A 07/10/2018   Procedure: COLONOSCOPY WITH PROPOFOL;  Surgeon: Lollie Sails, MD;  Location: [facility] ENDOSCOPY;  Service: Endoscopy;  Laterality: N/A;  . ESOPHAGOGASTRODUODENOSCOPY    . JOINT REPLACEMENT    . 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 KNEE ARTHROSCOPY Right   . TONSILLECTOMY       A IV Location/Drains/Wounds Patient Lines/Drains/Airways Status   Active Line/Drains/Airways    Name:   Placement date:   Placement time:   Site:   Days:   Peripheral IV 03/23/19 Right Arm   03/23/19    1412    Arm   less than 1   Airway   07/10/18    0741     256          Intake/Output Last 24 hours  Intake/Output Summary (Last 24 hours) at 03/23/2019 1945 Last data filed at 03/23/2019 1725 Gross per 24 hour  Intake -  Output 300 ml  Net -300 ml    Labs/Imaging Results for orders placed or performed during the hospital encounter of 03/23/19 (from the  past 48 hour(s))  Lipase, blood     Status: None   Collection Time: 03/23/19  2:11 PM  Result Value Ref Range   Lipase 23 11 - 51 U/L    Comment: Performed at [facility], [address] [place], [place] [address]  Comprehensive metabolic panel     Status: Abnormal   Collection Time: 03/23/19  2:11 PM  Result Value Ref Range   Sodium 137 135 - 145 mmol/L   Potassium 4.6 3.5 - 5.1 mmol/L   Chloride 106 98 - 111 mmol/L   CO2 20 (L) 22 - 32 mmol/L   Glucose, Bld 103 (H) 70 - 99 mg/dL   BUN 49 (H) 8 - 23 mg/dL   Creatinine, Ser 2.30 (H) 0.61 - 1.24 mg/dL   Calcium 8.9 8.9 - 10.3 mg/dL   Total Protein 7.3 6.5 - 8.1 g/dL    Albumin 4.1 3.5 - 5.0 g/dL   AST 17 15 - 41 U/L   ALT 14 0 - 44 U/L   Alkaline Phosphatase 54 38 - 126 U/L   Total Bilirubin 0.5 0.3 - 1.2 mg/dL   GFR calc non Af Amer 27 (L) >60 mL/min   GFR calc Af Amer 31 (L) >60 mL/min   Anion gap 11 5 - 15    Comment: Performed at [facility], [address] [place], [place] [address]  CBC     Status: Abnormal   Collection Time: 03/23/19  2:11 PM  Result Value Ref Range   WBC 6.2 4.0 - 10.5 K/uL   RBC 3.91 (L) 4.22 - 5.81 MIL/uL   Hemoglobin 9.3 (L) 13.0 - 17.0 g/dL   HCT 30.7 (L) 39.0 - 52.0 %   MCV 78.5 (L) 80.0 - 100.0 fL   MCH 23.8 (L) 26.0 - 34.0 pg   MCHC 30.3 30.0 - 36.0 g/dL   RDW 17.9 (H) 11.5 - 15.5 %   Platelets 239 150 - 400 K/uL   nRBC 0.0 0.0 - 0.2 %    Comment: Performed at [facility], [place], [place] [address]  Troponin I (High Sensitivity)     Status: None   Collection Time: 03/23/19  2:11 PM  Result Value Ref Range   Troponin I (High Sensitivity) 6 <18 ng/L    Comment: (NOTE) Elevated high sensitivity troponin I (hsTnI) values and significant  changes across serial measurements may suggest ACS but many other  chronic and acute conditions are known to elevate hsTnI results.  Refer to the "Links" section for chest pain algorithms and additional  guidance. Performed at [facility], [address] [place], [place] [address]   SARS Coronavirus 2 (CEPHEID - Performed in [facility] hospital lab), Hosp Order     Status: None   Collection Time: 03/23/19  2:40 PM   Specimen: Nasopharyngeal Swab  Result Value Ref Range   SARS Coronavirus 2 NEGATIVE NEGATIVE    Comment: (NOTE) If result is NEGATIVE SARS-CoV-2 target nucleic acids are NOT DETECTED. The SARS-CoV-2 RNA is generally detectable in upper and lower  respiratory specimens during the acute phase of infection. The lowest  concentration of SARS-CoV-2 viral copies this assay can detect is 250  copies / mL. A negative  result does not preclude SARS-CoV-2 infection  and should not be used as the sole basis for treatment or other  patient management decisions.  A negative result may occur with  improper specimen collection / handling, submission of specimen other  than nasopharyngeal swab, presence of viral mutation(s) within the  areas targeted by this assay, and inadequate number of viral copies  (<250 copies / mL). A negative result must be combined with clinical  observations, patient history, and epidemiological information. If result is POSITIVE SARS-CoV-2 target nucleic acids are DETECTED. The SARS-CoV-2 RNA is generally detectable in upper and lower  respiratory specimens dur ing the acute phase of infection.  Positive  results are indicative of active infection with SARS-CoV-2.  Clinical  correlation with patient history and other diagnostic information is  necessary to determine patient infection status.  Positive results do  not rule out bacterial infection or co-infection with other viruses. If result is PRESUMPTIVE POSTIVE SARS-CoV-2 nucleic acids MAY BE PRESENT.   A presumptive positive result was obtained on the submitted specimen  and confirmed on repeat testing.  While 2019 novel coronavirus  (SARS-CoV-2) nucleic acids may be present in the submitted sample  additional confirmatory testing may be necessary for epidemiological  and / or clinical management purposes  to differentiate between  SARS-CoV-2 and other Sarbecovirus currently known to infect humans.  If clinically indicated additional testing with an alternate test  methodology [phone]) is advised. The SARS-CoV-2 RNA is generally  detectable in upper and lower respiratory sp ecimens during the acute  phase of infection. The expected result is Negative. Fact Sheet for Patients:  StrictlyIdeas.no Fact Sheet for Healthcare Providers: BankingDealers.co.za This test is not yet  approved or cleared by the [place] FDA and has been authorized for detection and/or diagnosis of SARS-CoV-2 by FDA under an Emergency Use Authorization (EUA).  This EUA will remain in effect (meaning this test can be used) for the duration of the COVID-19 declaration under Section 564(b)(1) of the Act, 21 U.S.C. section 360bbb-3(b)(1), unless the authorization is terminated or revoked sooner. Performed at [facility], [address] [place], [place] [address]   Urinalysis, Complete w Microscopic     Status: Abnormal   Collection Time: 03/23/19  5:26 PM  Result Value Ref Range   Color, Urine STRAW (A) YELLOW   APPearance CLEAR (A) CLEAR   Specific Gravity, Urine 1.006 1.005 - 1.030   pH 5.0 5.0 - 8.0   Glucose, UA NEGATIVE NEGATIVE mg/dL   Hgb urine dipstick NEGATIVE NEGATIVE   Bilirubin Urine NEGATIVE NEGATIVE   Ketones, ur NEGATIVE NEGATIVE mg/dL   Protein, ur NEGATIVE NEGATIVE mg/dL   Nitrite NEGATIVE NEGATIVE   Leukocytes,Ua NEGATIVE NEGATIVE   WBC, UA 0-5 0 - 5 WBC/hpf   Bacteria, UA RARE (A) NONE SEEN   Squamous Epithelial / LPF NONE SEEN 0 - 5   Hyaline Casts, UA PRESENT     Comment: Performed at [facility], [address] [place], [place] [address]   No results found.  Pending Labs [employer] (From admission, onward)    Start     Ordered   Signed and Occupational hygienist morning,   R     Signed and Held   Signed and Held  CBC  Tomorrow morning,   R     Signed and Held   Signed and Held  CBC  (heparin)  Once,   R    Comments: Baseline for heparin therapy IF NOT ALREADY DRAWN.  Notify MD if PLT < 100 K.    Signed and Held   Signed and Held  Creatinine, serum  (heparin)  Once,   R    Comments: Baseline for  heparin therapy IF NOT ALREADY DRAWN.    Signed and Held          Vitals/Pain Today's Vitals   03/23/19 1730 03/23/19 1830 03/23/19 1900 03/23/19 1930  BP: (!) 127/49 (!) 135/59 (!) 136/52 (!) 126/52   Pulse:      Resp:      Temp:      TempSrc:      SpO2:      Weight:      Height:      PainSc:        Isolation Precautions No active isolations  Medications Medications  sodium chloride flush (NS) 0.9 % injection 3 mL (3 mLs Intravenous Not Given 03/23/19 1835)  insulin aspart (novoLOG) injection 0-9 Units (has no administration in time range)  insulin aspart (novoLOG) injection 0-5 Units (has no administration in time range)  0.9 %  sodium chloride infusion ( Intravenous Stopped 03/23/19 1943)    Mobility walks with device Low fall risk   Focused Assessments    R Recommendations: See Admitting Provider Note  Report given to:   Additional Notes: covid negative

## 2019-03-23 NOTE — ED Triage Notes (Signed)
States has had dizziness x months. States got worse 3 days ago making it difficult to walk. Denies fall or injury.

## 2019-03-23 NOTE — ED Provider Notes (Signed)
[facility] Emergency Department Provider Note ____________________________________________  Time seen: 1325  I have reviewed the triage vital signs and the nursing notes.  HISTORY  Chief Complaint  Dizziness and Abdominal Pain  HPI Patrick Turner is a 73 y.o. male presents himself to the ED from [facility].  Patient presented there this morning, for evaluation after he got off of his third shift job.  Patient who gives a history of chronic intermittent dizziness, reports symptoms seem to worsen over the last 3 days.  He describes difficulty walking noting sensation of a white out in his visual field, but he denies any syncope, falls, chest pain, shortness of breath, tinnitus, weakness, or frank vertigo.  Patient did have a episode of nausea and vomiting about 2 hours postprandial yesterday.  He continues to have some mild abdominal discomfort today, but denies any chronic or ongoing nausea, vomiting, or diarrhea.  The patient works as a Geophysical data processor, but denies known contacts despite his potential risk for exposures.  Patient's medical history listed below, consistent with chronic anemia, DM, and chronic kidney disease.  Past Medical History:  Diagnosis Date  . Altered mental status   . Anemia   . Anxiety   . Aortic atherosclerosis ([place])   . Arthritis   . Coronary artery disease   . CRD (chronic renal disease)   . Depression   . Diabetes mellitus without complication ([place])   . Encephalopathy acute   . GERD (gastroesophageal reflux disease)   . Hypercholesteremia   . Hypertension   . Neuropathy   . Severe obesity ([place])   . Sleep apnea   . Sleep terror    per patient this year per patient     Patient Active Problem List   Diagnosis Date Noted  . AMS (altered mental status) 12/24/2018  . Dizziness 06/30/2018  . Diabetes ([place]) 02/23/2018  . HTN (hypertension) 02/23/2018  . HLD (hyperlipidemia) 02/23/2018  . GERD (gastroesophageal reflux disease)  02/23/2018  . TIA (transient ischemic attack) 07/12/2016  . 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 Acute diarrhea 05/30/2016  . Acute encephalopathy 05/30/2016  . Anemia 05/30/2016  . Acute on chronic renal insufficiency 05/30/2016  . Diarrhea 05/30/2016  . Unspecified transient cerebral ischemia 05/29/2012    Past Surgical History:  Procedure Laterality Date  . APPENDECTOMY    . CIRCUMCISION    . COLONOSCOPY    . COLONOSCOPY WITH PROPOFOL N/A 07/10/2018   Procedure: COLONOSCOPY WITH PROPOFOL;  Surgeon: Lollie Sails, MD;  [facility] END[facility]COPY;  Service: Endoscopy;  Laterality: N/A;  . ESOPHAGOGASTRODUODENOSCOPY    . JOINT REPLACEMENT    . 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 KNEE ARTHROSCOPY Right   . TONSILLECTOMY      Prior to Admission medications   Medication Sig Start Date End Date Taking? Authorizing Provider  aspirin 81 MG tablet Take 1 tablet (81 mg total) by mouth daily. 02/23/18   Henreitta Leber, MD  atorvastatin (LIPITOR) 80 MG tablet Take 80 mg by mouth at bedtime. 12/14/15   [provider]  clonazePAM (KLONOPIN) 1 MG tablet Take 0.5 mg by mouth at bedtime as needed for anxiety.  12/18/15   [provider]  gabapentin (NEURONTIN) 100 MG capsule Take 2 capsules (200 mg total) by mouth 3 (three) times daily. 07/01/18 08/30/18  Henreitta Leber, MD  gabapentin (NEURONTIN) 400 MG capsule Take 400 mg by mouth 2 (two) times daily.    [provider]  Glucose Blood (ACCU-CHEK AVIVA PLUS VI) by In Vitro route.    [provider]  LEVEMIR 100 UNIT/ML injection Inject 0.2 mLs (20 Units total) into the skin at bedtime. 12/25/18   Salary, Avel Peace, MD  lisinopril-hydrochlorothiazide (PRINZIDE,ZESTORETIC) 20-12.5 MG per tablet Take 1 tablet by mouth daily.     [provider]   meclizine (ANTIVERT) 25 MG tablet Take 1 tablet (25 mg total) by mouth 3 (three) times daily as needed for dizziness. Patient not taking: Reported on 12/24/2018 07/01/18   Henreitta Leber, MD  metFORMIN (GLUCOPHAGE-XR) 500 MG 24 hr tablet Take 1,000 mg by mouth 2 (two) times daily.    [provider]  Omega-3 Fatty Acids (FISH OIL) 1000 MG CAPS Take 1 capsule by mouth daily.     [provider]  omeprazole (PRILOSEC) 40 MG capsule Take 40 mg by mouth daily.     [provider]  PARoxetine (PAXIL) 40 MG tablet Take 40 mg by mouth daily.     [provider]  pioglitazone (ACTOS) 15 MG tablet Take 15 mg by mouth daily.     [provider]  QUEtiapine (SEROQUEL) 100 MG tablet Take 100 mg by mouth at bedtime. 12/22/15   [provider]  topiramate (TOPAMAX) 100 MG tablet Take 100 mg by mouth 2 (two) times daily.    [provider]  vitamin B-12 (CYANOCOBALAMIN) 1000 MCG tablet Take 1,000 mcg by mouth daily.    [provider]    Allergies Penicillins and Tavist [clemastine]  Family History  Problem Relation Age of Onset  . Diabetes Mother   . Heart disease Mother   . Cancer Father   . COPD Father     Social History Social History   Tobacco Use  . Smoking status: Never Smoker  . Smokeless tobacco: Never Used  Substance Use Topics  . Alcohol use: Yes    Comment: rare  . Drug use: No    Review of Systems  Constitutional: Negative for fever. Eyes: Negative for visual changes. ENT: Negative for sore throat. Cardiovascular: Negative for chest pain. Respiratory: Negative for shortness of breath. Gastrointestinal: Positive for abdominal pain, vomiting and diarrhea. Genitourinary: Negative for dysuria. Musculoskeletal: Negative for back pain. Skin: Negative for rash. Neurological: Negative for headaches, focal weakness or numbness. Reports dizziness. ____________________________________________  PHYSICAL  EXAM:  VITAL SIGNS: ED Triage Vitals  Enc Vitals Group     BP 03/23/19 1226 (!) 152/70     Pulse Rate 03/23/19 1226 62     Resp 03/23/19 1226 20     Temp 03/23/19 1226 97.9 F (36.6 C)     Temp Source 03/23/19 1226 Oral     SpO2 03/23/19 1226 100 %     Weight 03/23/19 1228 187 lb (84.8 kg)     Height 03/23/19 1228 5\' 5"  (1.651 m)     Head Circumference --      Peak Flow --      Pain Score 03/23/19 1228 3     Pain Loc --      Pain Edu? --      Excl. in [place]? --     Constitutional: Alert and oriented. Well appearing and in no distress. Head: Normocephalic and atraumatic. Eyes: Conjunctivae are normal. Normal extraocular movements and fundi bilaterally Cardiovascular: Normal rate, regular rhythm. Normal distal pulses. Respiratory: Normal respiratory  effort. No wheezes/rales/rhonchi. Gastrointestinal: Soft and mildly tender to palpation to the low abdominal region.. No distention, rebound, guarding, or rigidity.  No organomegaly noted.  Normal sounds appreciated. Musculoskeletal: Nontender with normal range of motion in all extremities.  Neurologic:  Normal gait without ataxia. Normal speech and language. No gross focal neurologic deficits are appreciated. Skin:  Skin is warm, dry and intact. No rash noted. Psychiatric: Mood and affect are normal. Patient exhibits appropriate insight and judgment. ____________________________________________   LABS (pertinent positives/negatives) Labs Reviewed  COMPREHENSIVE METABOLIC PANEL - Abnormal; Notable for the following components:      Result Value   CO2 20 (*)    Glucose, Bld 103 (*)    BUN 49 (*)    Creatinine, Ser 2.30 (*)    GFR calc non Af Amer 27 (*)    GFR calc Af Amer 31 (*)    All other components within normal limits  CBC - Abnormal; Notable for the following components:   RBC 3.91 (*)    Hemoglobin 9.3 (*)    HCT 30.7 (*)    MCV 78.5 (*)    MCH 23.8 (*)    RDW 17.9 (*)    All other components within normal limits   URINALYSIS, COMPLETE (UACMP) WITH MICROSCOPIC - Abnormal; Notable for the following components:   Color, Urine STRAW (*)    APPearance CLEAR (*)    Bacteria, UA RARE (*)    All other components within normal limits  SARS CORONAVIRUS 2 (HOSPITAL ORDER, [place] [facility])  LIPASE, BLOOD  TROPONIN I (HIGH SENSITIVITY)  TROPONIN I (HIGH SENSITIVITY)  ____________________________________________  EKG  NSR 62 bpm PR interval 170 ms QRS Duration 74 ms Ns STEMI ____________________________________________   RADIOLOGY  ____________________________________________  PROCEDURES  Procedures NS 1000 ml bolus ____________________________________________  INITIAL IMPRESSION / ASSESSMENT AND PLAN / ED COURSE  Sheffield Hawker [place] was evaluated in Emergency Department on 03/23/2019 for the symptoms described in the history of present illness. He was evaluated in the context of the global COVID-19 pandemic, which necessitated consideration that the patient might be at risk for infection with the SARS-CoV-2 virus that causes COVID-19. Institutional protocols and algorithms that pertain to the evaluation of patients at risk for COVID-19 are in a state of rapid change based on information released by regulatory bodies including the CDC and federal and state organizations. These policies and algorithms were followed during the patient's care in the ED.  Patient with ED evaluation of acute on chronic dizziness with symptoms worsening over the last 3 days, without syncope.  Patient denies any recent illness, but does admit to nausea and vomiting x2 about 2 hours after meal yesterday.  Patient's exam is overall benign and reassuring at this time.  He is without elicits significant complaints of pain or discomfort.  His labs do confirm his chronic anemia, and acute on chronic kidney disease with a  BUN/Cr of 49/2.30.  He denies any significant pain complaints at this time.  He will be admitted  to the hospital service for further management.   ----------------------------------------- 5:40 PM on 03/23/2019 -----------------------------------------  S/w Dr. Heron Sabins, he will evaluate the patient for admission. ____________________________________________  FINAL CLINICAL IMPRESSION(S) / ED DIAGNOSES  Final diagnoses:  Chronic anemia  Acute renal failure superimposed on stage 3 chronic kidney disease, unspecified acute renal failure type [facility])      Carmie End, Dannielle Karvonen, PA-C 03/23/19 1744    Harvest Dark, MD 03/23/19 2309

## 2019-03-23 NOTE — H&P (Signed)
[place] at [place] NAME: Patrick Turner    MR#:  [id number]  DATE OF BIRTH:  [birth year]  DATE OF ADMISSION:  03/23/2019  PRIMARY CARE PHYSICIAN: Kirk Ruths, MD   REQUESTING/REFERRING PHYSICIAN: Dr. Harvest Dark.  CHIEF COMPLAINT:   Chief Complaint  Patient presents with  . Dizziness  . Abdominal Pain    HISTORY OF PRESENT ILLNESS:  Patrick Turner  is a 73 y.o. male with a known history of CKD stage III, diabetes, hypertension, hyperlipidemia, obstructive sleep apnea, neuropathy, anxiety, coronary artery disease who presented to the hospital due to dizziness.  Patient says he was in her usual state of health and developed persistent dizziness over the past day or 2 progressively getting worse.  Patient says that he also had some intermittent nausea and vomiting since yesterday.  Patient says he ate breakfast yesterday which was a tomato sandwich and then had some nausea and vomiting and since then has been feeling dizzy and weak.  He presents to the ER and was noted to be in acute on chronic renal failure and therefore hospitalist services were contacted for admission.  Patient denies any abdominal pain, diarrhea, fever chills cough or any other associated symptoms.  Patient's COVID-19 test is negative.  PAST MEDICAL HISTORY:   Past Medical History:  Diagnosis Date  . Altered mental status   . Anemia   . Anxiety   . Aortic atherosclerosis ([place])   . Arthritis   . Coronary artery disease   . CRD (chronic renal disease)   . Depression   . Diabetes mellitus without complication ([place])   . Encephalopathy acute   . GERD (gastroesophageal reflux disease)   . Hypercholesteremia   . Hypertension   . Neuropathy   . Severe obesity ([place])   . Sleep apnea   . Sleep terror    per patient this year per patient     PAST SURGICAL HISTORY:   Past Surgical History:  Procedure Laterality Date  . APPENDECTOMY    . CIRCUMCISION     . COLONOSCOPY    . COLONOSCOPY WITH PROPOFOL N/A 07/10/2018   Procedure: COLONOSCOPY WITH PROPOFOL;  Surgeon: Lollie Sails, MD;  Location: [facility] ENDOSCOPY;  Service: Endoscopy;  Laterality: N/A;  . ESOPHAGOGASTRODUODENOSCOPY    . JOINT REPLACEMENT    . 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 KNEE ARTHROSCOPY Right   . TONSILLECTOMY      SOCIAL HISTORY:   Social History   Tobacco Use  . Smoking status: Never Smoker  . Smokeless tobacco: Never Used  Substance Use Topics  . Alcohol use: Yes    Comment: rare    FAMILY HISTORY:   Family History  Problem Relation Age of Onset  . Diabetes Mother   . Heart disease Mother   . Cancer Father   . COPD Father     DRUG ALLERGIES:   Allergies  Allergen Reactions  . Penicillins Anaphylaxis    Has patient had a PCN reaction causing immediate rash, facial/tongue/throat swelling, SOB or lightheadedness with hypotension: Yes Has patient had a PCN reaction causing severe rash involving mucus membranes or skin necrosis: No Has patient had a PCN reaction that required hospitalization Yes Has patient had a PCN reaction occurring within the last 10 years: No If all of the above answers are "NO",  then may proceed with Cephalosporin use.  . Clemastine Swelling  . Loratadine     REVIEW OF SYSTEMS:   Review of Systems  Constitutional: Negative for fever and weight loss.  HENT: Negative for congestion, nosebleeds and tinnitus.   Eyes: Negative for blurred vision, double vision and redness.  Respiratory: Negative for cough, hemoptysis and shortness of breath.   Cardiovascular: Negative for chest pain, orthopnea, leg swelling and PND.  Gastrointestinal: Positive for nausea. Negative for abdominal pain, diarrhea, melena and vomiting.  Genitourinary: Negative for dysuria, hematuria and  urgency.  Musculoskeletal: Negative for falls and joint pain.  Neurological: Positive for dizziness. Negative for tingling, sensory change, focal weakness, seizures, weakness and headaches.  Endo/Heme/Allergies: Negative for polydipsia. Does not bruise/bleed easily.  Psychiatric/Behavioral: Negative for depression and memory loss. The patient is not nervous/anxious.     MEDICATIONS AT HOME:   Prior to Admission medications   Medication Sig Start Date End Date Taking? Authorizing Provider  aspirin 81 MG tablet Take 1 tablet (81 mg total) by mouth daily. 02/23/18  Yes Henreitta Leber, MD  atorvastatin (LIPITOR) 80 MG tablet Take 80 mg by mouth at bedtime. 12/14/15  Yes [provider]  clonazePAM (KLONOPIN) 1 MG tablet Take 0.5 mg by mouth at bedtime as needed for anxiety.  12/18/15  Yes [provider]  gabapentin (NEURONTIN) 400 MG capsule Take 400 mg by mouth 2 (two) times daily.   Yes [provider]  insulin detemir (LEVEMIR) 100 UNIT/ML injection Inject 15 Units into the skin daily. 03/04/19  Yes [provider]  lisinopril-hydrochlorothiazide (PRINZIDE,ZESTORETIC) 20-12.5 MG per tablet Take 1 tablet by mouth daily.    Yes [provider]  metFORMIN (GLUCOPHAGE-XR) 500 MG 24 hr tablet Take 1,000 mg by mouth 2 (two) times a day. 12/20/18  Yes [provider]  Omega-3 Fatty Acids (FISH OIL) 1000 MG CAPS Take 1 capsule by mouth daily.    Yes [provider]  omeprazole (PRILOSEC) 40 MG capsule Take 40 mg by mouth daily.    Yes [provider]  OZEMPIC, 0.25 OR 0.5 MG/DOSE, 2 MG/1.5ML SOPN Inject 0.375 mLs into the skin once a week. Wednesday 03/04/19  Yes [provider]  PARoxetine (PAXIL) 40 MG tablet Take 40 mg by mouth daily.    Yes [provider]  pioglitazone (ACTOS) 15 MG tablet Take 15 mg by mouth daily.    Yes [provider]  QUEtiapine (SEROQUEL) 100 MG tablet Take 100 mg by mouth at  bedtime. 12/22/15  Yes [provider]  topiramate (TOPAMAX) 100 MG tablet Take 100 mg by mouth daily.    Yes [provider]  vitamin B-12 (CYANOCOBALAMIN) 1000 MCG tablet Take 1,000 mcg by mouth daily.   Yes [provider]  Glucose Blood (ACCU-CHEK AVIVA PLUS VI) by In Vitro route.    [provider]  meclizine (ANTIVERT) 25 MG tablet Take 1 tablet (25 mg total) by mouth 3 (three) times daily as needed for dizziness. Patient not taking: Reported on 12/24/2018 07/01/18   Henreitta Leber, MD      VITAL SIGNS:  Blood pressure (!) 149/61, pulse 68, temperature 97.9 F (36.6 C), temperature source Oral, resp. rate 20, height 5\' 5"  (1.651 m), weight 84.8 kg, SpO2 100 %.  PHYSICAL EXAMINATION:  Physical Exam  GENERAL:  73 y.o.-year-old patient lying in the bed with no acute distress.  EYES: Pupils equal, round, reactive to light and accommodation. No scleral icterus. Extraocular muscles  intact.  HEENT: Head atraumatic, normocephalic. Oropharynx and nasopharynx clear. No oropharyngeal erythema, moist oral mucosa  NECK:  Supple, no jugular venous distention. No thyroid enlargement, no tenderness.  LUNGS: Normal breath sounds bilaterally, no wheezing, rales, rhonchi. No use of accessory muscles of respiration.  CARDIOVASCULAR: S1, S2 RRR. No murmurs, rubs, gallops, clicks.  ABDOMEN: Soft, nontender, nondistended. Bowel sounds present. No organomegaly or mass.  EXTREMITIES: No pedal edema, cyanosis, or clubbing. + 2 pedal & radial pulses b/l.   NEUROLOGIC: Cranial nerves II through XII are intact. No focal Motor or sensory deficits appreciated b/l.   PSYCHIATRIC: The patient is alert and oriented x 3.  SKIN: No obvious rash, lesion, or ulcer.   LABORATORY PANEL:   CBC Recent Labs  Lab 03/23/19 1411  WBC 6.2  HGB 9.3*  HCT 30.7*  PLT 239   ------------------------------------------------------------------------------------------------------------------   Chemistries  Recent Labs  Lab 03/23/19 1411  NA 137  K 4.6  CL 106  CO2 20*  GLUCOSE 103*  BUN 49*  CREATININE 2.30*  CALCIUM 8.9  AST 17  ALT 14  ALKPHOS 54  BILITOT 0.5   ------------------------------------------------------------------------------------------------------------------  Cardiac Enzymes No results for input(s): TROPONINI in the last 168 hours. ------------------------------------------------------------------------------------------------------------------  RADIOLOGY:  No results found.   IMPRESSION AND PLAN:    73 y.o. male with a known history of CKD stage III, diabetes, hypertension, hyperlipidemia, obstructive sleep apnea, neuropathy, anxiety, coronary artery disease who presented to the hospital due to dizziness.  1.  Acute on chronic renal failure-patient presents to the hospital with a creatinine of 2.3 with baseline creatinine around 1.2.  This is secondary to nausea vomiting and mild dehydration. - We will hydrate the patient with IV fluids, hold patient's hydrochlorothiazide, lisinopril and metformin. -Follow BUN/creatinine and urine output.  2.  Dizziness- suspected to be secondary to dehydration and from the acute on chronic renal failure. -Hydrate the patient with IV fluids, follow clinically.  3.  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  Hold metformin given the acute on chronic renal failure. -Follow blood sugars.  4.  Diabetic neuropathy-continue gabapentin.  5.  Anxiety-continue Klonopin, Paxil.  6.  GERD-continue Protonix.    All the records are reviewed and case discussed with ED provider. Management plans discussed with the patient, family and they are in agreement.  CODE STATUS: Full code  TOTAL TIME TAKING CARE OF THIS PATIENT: 45 minutes.    Henreitta Leber M.D on 03/23/2019 at 7:09 PM  Between 7am to 6pm - Pager - [phone]  After 6pm go to [email or web address] - password  [facility]  [facility]  Office  [phone]  CC: Primary care physician; Kirk Ruths, MD

## 2019-03-24 DIAGNOSIS — R42 Dizziness and giddiness: Secondary | ICD-10-CM | POA: Diagnosis not present

## 2019-03-24 DIAGNOSIS — F419 Anxiety disorder, unspecified: Secondary | ICD-10-CM | POA: Diagnosis not present

## 2019-03-24 DIAGNOSIS — N179 Acute kidney failure, unspecified: Secondary | ICD-10-CM | POA: Diagnosis not present

## 2019-03-24 DIAGNOSIS — E1122 Type 2 diabetes mellitus with diabetic chronic kidney disease: Secondary | ICD-10-CM | POA: Diagnosis not present

## 2019-03-24 LAB — BASIC METABOLIC PANEL
Anion gap: 7 (ref 5–15)
BUN: 41 mg/dL — ABNORMAL HIGH (ref 8–23)
CO2: 19 mmol/L — ABNORMAL LOW (ref 22–32)
Calcium: 8.3 mg/dL — ABNORMAL LOW (ref 8.9–10.3)
Chloride: 116 mmol/L — ABNORMAL HIGH (ref 98–111)
Creatinine, Ser: 1.58 mg/dL — ABNORMAL HIGH (ref 0.61–1.24)
GFR calc Af Amer: 50 mL/min — ABNORMAL LOW (ref >60)
GFR calc non Af Amer: 43 mL/min — ABNORMAL LOW (ref >60)
Glucose, Bld: 108 mg/dL — ABNORMAL HIGH (ref 70–99)
Potassium: 4.4 mmol/L (ref 3.5–5.1)
Sodium: 142 mmol/L (ref 135–145)

## 2019-03-24 LAB — CBC
HCT: 27.6 % — ABNORMAL LOW (ref 39.0–52.0)
Hemoglobin: 8.4 g/dL — ABNORMAL LOW (ref 13.0–17.0)
MCH: 24.1 pg — ABNORMAL LOW (ref 26.0–34.0)
MCHC: 30.4 g/dL (ref 30.0–36.0)
MCV: 79.3 fL — ABNORMAL LOW (ref 80.0–100.0)
Platelets: 209 10*3/uL (ref 150–400)
RBC: 3.48 MIL/uL — ABNORMAL LOW (ref 4.22–5.81)
RDW: 17.9 % — ABNORMAL HIGH (ref 11.5–15.5)
WBC: 5.3 10*3/uL (ref 4.0–10.5)
nRBC: 0 % (ref 0.0–0.2)

## 2019-03-24 LAB — GLUCOSE, CAPILLARY
Glucose-Capillary: 83 mg/dL (ref 70–99)
Glucose-Capillary: 96 mg/dL (ref 70–99)

## 2019-03-24 MED ORDER — METFORMIN HCL ER 500 MG PO TB24: 500.0000 mg | ORAL_TABLET | Freq: Two times a day (BID) | ORAL | 0 refills | Status: DC

## 2019-03-24 NOTE — Plan of Care (Signed)

## 2019-03-24 NOTE — Progress Notes (Signed)
[place] at [place] was admitted to the Hospital on 03/23/2019 and Discharged  03/24/2019 and should be excused from work/school from 03/23/2019 to 03/25/2019 may return to work/school without any restrictions form 03/26/2019.  Call Saundra Shelling MD with questions.  Saundra Shelling M.D on 03/24/2019,at 11:28 AM  [place] at [facility]  [phone]

## 2019-03-24 NOTE — Care Management Obs Status (Signed)
[facility] NOTIFICATION   Patient Details  Name: Patrick Turner MRN: [id number] Date of Birth: [birth year]   Medicare Observation Status Notification Given:  No  Patient dc in under 24 hours   Marissah Vandemark A Lorence Nagengast, RN 03/24/2019, 10:27 AM

## 2019-03-24 NOTE — Progress Notes (Signed)
Advanced care plan. Purpose of the Encounter: CODE STATUS Parties in Attendance: Patient Patient's Decision Capacity: Good Subjective/Patient's story: Patrick Turner  is a 73 y.o. male with a known history of CKD stage III, diabetes, hypertension, hyperlipidemia, obstructive sleep apnea, neuropathy, anxiety, coronary artery disease who presented to the hospital due to dizziness.  Patient says he was in her usual state of health and developed persistent dizziness over the past day or 2 progressively getting worse.  Patient says that he also had some intermittent nausea and vomiting since yesterday.  Patient says he ate breakfast yesterday which was a tomato sandwich and then had some nausea and vomiting and since then has been feeling dizzy and weak.  He presents to the ER and was noted to be in acute on chronic renal failure and therefore hospitalist services were contacted for admission.  Patient denies any abdominal pain, diarrhea, fever chills cough or any other associated symptoms.  Patient's COVID-19 test is negative. Objective/Medical story Patient needs hydration with IV fluids.  Needs renal function to be monitored and nephrotoxic drugs to be avoided.  Needs monitoring of renal function.  Check for orthostatics. Goals of care determination:  Advance care directives goals of care treatment plan discussed. Patient currently wants everything done which includes CPR, intubation ventilator if the need arises. CODE STATUS: Full code Time spent discussing advanced care planning: 16 minutes

## 2019-03-24 NOTE — Discharge Summary (Signed)
[facility] NAME: Patrick Turner    MR#:  [id number]  DATE OF BIRTH:  [birth year]  DATE OF ADMISSION:  03/23/2019 ADMITTING PHYSICIAN: Henreitta Leber, MD  DATE OF DISCHARGE: 03/24/2019  PRIMARY CARE PHYSICIAN: Kirk Ruths, MD   ADMISSION DIAGNOSIS:  Chronic anemia [D64.9] Acute renal failure superimposed on stage 3 chronic kidney disease, unspecified acute renal failure type ([place]) [N17.9, N18.3]  DISCHARGE DIAGNOSIS:  Acute on chronic kidney disease stage III Chronic anemia Dehydration Dizziness secondary to dehydration Type 2 diabetes mellitus Diabetic neuropathy  SECONDARY DIAGNOSIS:   Past Medical History:  Diagnosis Date  . Altered mental status   . Anemia   . Anxiety   . Aortic atherosclerosis ([place])   . Arthritis   . Coronary artery disease   . CRD (chronic renal disease)   . Depression   . Diabetes mellitus without complication ([place])   . Encephalopathy acute   . GERD (gastroesophageal reflux disease)   . Hypercholesteremia   . Hypertension   . Neuropathy   . Severe obesity ([place])   . Sleep apnea   . Sleep terror    per patient this year per patient      ADMITTING HISTORY Patrick Turner  is a 73 y.o. male with a known history of CKD stage III, diabetes, hypertension, hyperlipidemia, obstructive sleep apnea, neuropathy, anxiety, coronary artery disease who presented to the hospital due to dizziness.  Patient says he was in her usual state of health and developed persistent dizziness over the past day or 2 progressively getting worse.  Patient says that he also had some intermittent nausea and vomiting since yesterday.  Patient says he ate breakfast yesterday which was a tomato sandwich and then had some nausea and vomiting and since then has been feeling dizzy and weak.  He presents to the ER and was noted to be in acute on chronic renal failure and therefore hospitalist services were contacted for  admission.  Patient denies any abdominal pain, diarrhea, fever chills cough or any other associated symptoms.  Patient's COVID-19 test is negative.  HOSPITAL COURSE:  Patient was admitted to medical floor.  His HCTZ diuretic and ACE inhibitor were held along with metformin.  Patient was hydrated with IV fluids.  Renal function improved.  Dizziness also completely resolved with IV fluids.  Patient hemodynamically stable creatinine is come down.  Will be discharged home with decreased dose of metformin.  Patient again to be reevaluated by primary care physician in metformin dose to be adjusted based on renal functions.  COVID-19 test was negative.  CONSULTS OBTAINED:    DRUG ALLERGIES:   Allergies  Allergen Reactions  . Penicillins Anaphylaxis    Has patient had a PCN reaction causing immediate rash, facial/tongue/throat swelling, SOB or lightheadedness with hypotension: Yes Has patient had a PCN reaction causing severe rash involving mucus membranes or skin necrosis: No Has patient had a PCN reaction that required hospitalization Yes Has patient had a PCN reaction occurring within the last 10 years: No If all of the above answers are "NO", then may proceed with Cephalosporin use.  . Clemastine Swelling  . Loratadine     DISCHARGE MEDICATIONS:   Allergies as of 03/24/2019      Reactions   Penicillins Anaphylaxis   Has patient had a PCN reaction causing immediate rash, facial/tongue/throat swelling, SOB or lightheadedness with hypotension: Yes Has patient had a PCN reaction causing severe rash involving mucus membranes or  skin necrosis: No Has patient had a PCN reaction that required hospitalization Yes Has patient had a PCN reaction occurring within the last 10 years: No If all of the above answers are "NO", then may proceed with Cephalosporin use.   Clemastine Swelling   Loratadine       Medication List    STOP taking these medications   meclizine 25 MG tablet Commonly known as:  ANTIVERT     TAKE these medications   ACCU-CHEK AVIVA PLUS VI by In Vitro route.   aspirin 81 MG tablet Take 1 tablet (81 mg total) by mouth daily.   atorvastatin 80 MG tablet Commonly known as: LIPITOR Take 80 mg by mouth at bedtime.   clonazePAM 1 MG tablet Commonly known as: KLONOPIN Take 0.5 mg by mouth at bedtime as needed for anxiety.   Fish Oil 1000 MG Caps Take 1 capsule by mouth daily.   gabapentin 400 MG capsule Commonly known as: NEURONTIN Take 400 mg by mouth 2 (two) times daily.   Levemir 100 UNIT/ML injection Generic drug: insulin detemir Inject 15 Units into the skin daily.   lisinopril-hydrochlorothiazide 20-12.5 MG tablet Commonly known as: ZESTORETIC Take 1 tablet by mouth daily.   metFORMIN 500 MG 24 hr tablet Commonly known as: GLUCOPHAGE-XR Take 1 tablet (500 mg total) by mouth 2 (two) times a day. What changed: how much to take   omeprazole 40 MG capsule Commonly known as: PRILOSEC Take 40 mg by mouth daily.   Ozempic (0.25 or 0.5 MG/DOSE) 2 MG/1.5ML Sopn Generic drug: Semaglutide(0.25 or 0.5MG /DOS) Inject 0.375 mLs into the skin once a week. Wednesday   PARoxetine 40 MG tablet Commonly known as: PAXIL Take 40 mg by mouth daily.   pioglitazone 15 MG tablet Commonly known as: ACTOS Take 15 mg by mouth daily.   QUEtiapine 100 MG tablet Commonly known as: SEROQUEL Take 100 mg by mouth at bedtime.   topiramate 100 MG tablet Commonly known as: TOPAMAX Take 100 mg by mouth daily.   vitamin B-12 1000 MCG tablet Commonly known as: CYANOCOBALAMIN Take 1,000 mcg by mouth daily.       Today  Patient seen today No dizziness Tolerating diet well Hemodynamically stable VITAL SIGNS:  Blood pressure (!) 140/49, pulse 63, temperature 97.7 F (36.5 C), temperature source Oral, resp. rate 17, height 5\' 5"  (1.651 m), weight 84.8 kg, SpO2 100 %.  I/O:    Intake/Output Summary (Last 24 hours) at 03/24/2019 1130 Last data filed at  03/24/2019 0618 Gross per 24 hour  Intake 480 ml  Output 1500 ml  Net -1020 ml    PHYSICAL EXAMINATION:  Physical Exam  GENERAL:  73 y.o.-year-old patient lying in the bed with no acute distress.  LUNGS: Normal breath sounds bilaterally, no wheezing, rales,rhonchi or crepitation. No use of accessory muscles of respiration.  CARDIOVASCULAR: S1, S2 normal. No murmurs, rubs, or gallops.  ABDOMEN: Soft, non-tender, non-distended. Bowel sounds present. No organomegaly or mass.  NEUROLOGIC: Moves all 4 extremities. PSYCHIATRIC: The patient is alert and oriented x 3.  SKIN: No obvious rash, lesion, or ulcer.   DATA REVIEW:   CBC Recent Labs  Lab 03/24/19 0608  WBC 5.3  HGB 8.4*  HCT 27.6*  PLT 209    Chemistries  Recent Labs  Lab 03/23/19 1411 03/24/19 0608  NA 137 142  K 4.6 4.4  CL 106 116*  CO2 20* 19*  GLUCOSE 103* 108*  BUN 49* 41*  CREATININE 2.30* 1.58*  CALCIUM  8.9 8.3*  AST 17  --   ALT 14  --   ALKPHOS 54  --   BILITOT 0.5  --     Cardiac Enzymes No results for input(s): TROPONINI in the last 168 hours.  Microbiology Results  Results for orders placed or performed during the hospital encounter of 03/23/19  SARS Coronavirus 2 (CEPHEID - Performed in [facility] hospital lab), Hosp Order     Status: None   Collection Time: 03/23/19  2:40 PM   Specimen: Nasopharyngeal Swab  Result Value Ref Range Status   SARS Coronavirus 2 NEGATIVE NEGATIVE Final    Comment: (NOTE) If result is NEGATIVE SARS-CoV-2 target nucleic acids are NOT DETECTED. The SARS-CoV-2 RNA is generally detectable in upper and lower  respiratory specimens during the acute phase of infection. The lowest  concentration of SARS-CoV-2 viral copies this assay can detect is 250  copies / mL. A negative result does not preclude SARS-CoV-2 infection  and should not be used as the sole basis for treatment or other  patient management decisions.  A negative result may occur with  improper  specimen collection / handling, submission of specimen other  than nasopharyngeal swab, presence of viral mutation(s) within the  areas targeted by this assay, and inadequate number of viral copies  (<250 copies / mL). A negative result must be combined with clinical  observations, patient history, and epidemiological information. If result is POSITIVE SARS-CoV-2 target nucleic acids are DETECTED. The SARS-CoV-2 RNA is generally detectable in upper and lower  respiratory specimens dur ing the acute phase of infection.  Positive  results are indicative of active infection with SARS-CoV-2.  Clinical  correlation with patient history and other diagnostic information is  necessary to determine patient infection status.  Positive results do  not rule out bacterial infection or co-infection with other viruses. If result is PRESUMPTIVE POSTIVE SARS-CoV-2 nucleic acids MAY BE PRESENT.   A presumptive positive result was obtained on the submitted specimen  and confirmed on repeat testing.  While 2019 novel coronavirus  (SARS-CoV-2) nucleic acids may be present in the submitted sample  additional confirmatory testing may be necessary for epidemiological  and / or clinical management purposes  to differentiate between  SARS-CoV-2 and other Sarbecovirus currently known to infect humans.  If clinically indicated additional testing with an alternate test  methodology [phone]) is advised. The SARS-CoV-2 RNA is generally  detectable in upper and lower respiratory sp ecimens during the acute  phase of infection. The expected result is Negative. Fact Sheet for Patients:  StrictlyIdeas.no Fact Sheet for Healthcare Providers: BankingDealers.co.za This test is not yet approved or cleared by the [place] FDA and has been authorized for detection and/or diagnosis of SARS-CoV-2 by FDA under an Emergency Use Authorization (EUA).  This EUA will remain in  effect (meaning this test can be used) for the duration of the COVID-19 declaration under Section 564(b)(1) of the Act, 21 U.S.C. section 360bbb-3(b)(1), unless the authorization is terminated or revoked sooner. Performed at [facility], [address] [place], [place] [address]     RADIOLOGY:  No results found.  Follow up with PCP in 1 week.  Management plans discussed with the patient, family and they are in agreement.  CODE STATUS: Full code    Code Status Orders  (From admission, onward)         Start     Ordered   03/23/19 2021  Full code  Continuous  03/23/19 2021        Code Status History    Date Active Date Inactive Code Status Order ID Comments User Context   12/24/2018 0947 12/25/2018 1404 Full Code [id number]  Gorden Harms, MD Inpatient   06/30/2018 1048 07/01/2018 1759 Full Code [id number]  Henreitta Leber, MD Inpatient   02/23/2018 0623 02/23/2018 2340 Full Code [id number]  Lance Coon, MD Inpatient   06/25/2017 0646 06/26/2017 1336 Full Code [id number]  Saundra Shelling, MD Inpatient   07/12/2016 2022 07/13/2016 2311 Full Code [id number]  Demetrios Loll, MD Inpatient   06/27/2016 1656 06/29/2016 2034 Full Code [id number]  Dereck Leep, MD Inpatient   05/30/2016 1639 06/01/2016 1501 Full Code [id number]  Theodoro Grist, MD Inpatient   Advance Care Planning Activity      TOTAL TIME TAKING CARE OF THIS PATIENT ON DAY OF DISCHARGE: more than 34 minutes.   Saundra Shelling M.D on 03/24/2019 at 11:30 AM  Between 7am to 6pm - Pager - [phone]  After 6pm go to [email or web address] - password [email or web address] [facility]  Office  [phone]  CC: Primary care physician; Kirk Ruths, MD  Note: This dictation was prepared with Dragon dictation along with smaller phrase technology. Any transcriptional errors that result from this process are unintentional.

## 2019-03-25 DIAGNOSIS — K219 Gastro-esophageal reflux disease without esophagitis: Secondary | ICD-10-CM | POA: Diagnosis not present

## 2019-03-25 DIAGNOSIS — I10 Essential (primary) hypertension: Secondary | ICD-10-CM | POA: Diagnosis not present

## 2019-03-25 DIAGNOSIS — D500 Iron deficiency anemia secondary to blood loss (chronic): Secondary | ICD-10-CM | POA: Diagnosis not present

## 2019-03-27 DIAGNOSIS — D509 Iron deficiency anemia, unspecified: Secondary | ICD-10-CM | POA: Diagnosis not present

## 2019-03-27 DIAGNOSIS — D649 Anemia, unspecified: Secondary | ICD-10-CM | POA: Diagnosis not present

## 2019-03-29 DIAGNOSIS — H2512 Age-related nuclear cataract, left eye: Secondary | ICD-10-CM | POA: Diagnosis not present

## 2019-03-29 DIAGNOSIS — Z01818 Encounter for other preprocedural examination: Secondary | ICD-10-CM | POA: Diagnosis not present

## 2019-03-29 DIAGNOSIS — E113491 Type 2 diabetes mellitus with severe nonproliferative diabetic retinopathy without macular edema, right eye: Secondary | ICD-10-CM | POA: Diagnosis not present

## 2019-04-17 DIAGNOSIS — F323 Major depressive disorder, single episode, severe with psychotic features: Secondary | ICD-10-CM | POA: Diagnosis not present

## 2019-04-19 ENCOUNTER — Other Ambulatory Visit: Payer: Self-pay

## 2019-04-19 ENCOUNTER — Other Ambulatory Visit
Admission: RE | Admit: 2019-04-19 | Discharge: 2019-04-19 | Disposition: A | Discharge status: Home / Self Care | Payer: Medicare HMO | Source: Ambulatory Visit | Attending: Gastroenterology | Admitting: Gastroenterology

## 2019-04-19 DIAGNOSIS — Z01812 Encounter for preprocedural laboratory examination: Secondary | ICD-10-CM | POA: Diagnosis not present

## 2019-04-19 DIAGNOSIS — Z20828 Contact with and (suspected) exposure to other viral communicable diseases: Secondary | ICD-10-CM | POA: Diagnosis not present

## 2019-04-20 LAB — SARS CORONAVIRUS 2 (TAT 6-24 HRS): SARS Coronavirus 2: NEGATIVE

## 2019-04-22 ENCOUNTER — Encounter: Payer: Self-pay | Admitting: *Deleted

## 2019-04-23 ENCOUNTER — Encounter
Admission: RE | Disposition: A | Discharge status: Home / Self Care | Payer: Self-pay | Source: Home / Self Care | Attending: Gastroenterology

## 2019-04-23 ENCOUNTER — Ambulatory Visit: Payer: Medicare HMO | Admitting: Anesthesiology

## 2019-04-23 ENCOUNTER — Ambulatory Visit
Admission: RE | Admit: 2019-04-23 | Discharge: 2019-04-23 | Disposition: A | Discharge status: Home / Self Care | Payer: Medicare HMO | Attending: Gastroenterology | Admitting: Gastroenterology

## 2019-04-23 ENCOUNTER — Other Ambulatory Visit: Payer: Self-pay

## 2019-04-23 DIAGNOSIS — F329 Major depressive disorder, single episode, unspecified: Secondary | ICD-10-CM | POA: Insufficient documentation

## 2019-04-23 DIAGNOSIS — Z6829 Body mass index (BMI) 29.0-29.9, adult: Secondary | ICD-10-CM | POA: Diagnosis not present

## 2019-04-23 DIAGNOSIS — E1122 Type 2 diabetes mellitus with diabetic chronic kidney disease: Secondary | ICD-10-CM | POA: Insufficient documentation

## 2019-04-23 DIAGNOSIS — Z794 Long term (current) use of insulin: Secondary | ICD-10-CM | POA: Insufficient documentation

## 2019-04-23 DIAGNOSIS — Z8673 Personal history of transient ischemic attack (TIA), and cerebral infarction without residual deficits: Secondary | ICD-10-CM | POA: Insufficient documentation

## 2019-04-23 DIAGNOSIS — E7800 Pure hypercholesterolemia, unspecified: Secondary | ICD-10-CM | POA: Diagnosis not present

## 2019-04-23 DIAGNOSIS — R1030 Lower abdominal pain, unspecified: Secondary | ICD-10-CM | POA: Diagnosis not present

## 2019-04-23 DIAGNOSIS — K5730 Diverticulosis of large intestine without perforation or abscess without bleeding: Secondary | ICD-10-CM | POA: Insufficient documentation

## 2019-04-23 DIAGNOSIS — K449 Diaphragmatic hernia without obstruction or gangrene: Secondary | ICD-10-CM | POA: Diagnosis not present

## 2019-04-23 DIAGNOSIS — E1140 Type 2 diabetes mellitus with diabetic neuropathy, unspecified: Secondary | ICD-10-CM | POA: Insufficient documentation

## 2019-04-23 DIAGNOSIS — I2510 Atherosclerotic heart disease of native coronary artery without angina pectoris: Secondary | ICD-10-CM | POA: Insufficient documentation

## 2019-04-23 DIAGNOSIS — K2980 Duodenitis without bleeding: Secondary | ICD-10-CM | POA: Diagnosis not present

## 2019-04-23 DIAGNOSIS — Z7982 Long term (current) use of aspirin: Secondary | ICD-10-CM | POA: Diagnosis not present

## 2019-04-23 DIAGNOSIS — K208 Other esophagitis: Secondary | ICD-10-CM | POA: Diagnosis not present

## 2019-04-23 DIAGNOSIS — K228 Other specified diseases of esophagus: Secondary | ICD-10-CM | POA: Diagnosis not present

## 2019-04-23 DIAGNOSIS — K2950 Unspecified chronic gastritis without bleeding: Secondary | ICD-10-CM | POA: Diagnosis not present

## 2019-04-23 DIAGNOSIS — D509 Iron deficiency anemia, unspecified: Secondary | ICD-10-CM | POA: Diagnosis not present

## 2019-04-23 DIAGNOSIS — I129 Hypertensive chronic kidney disease with stage 1 through stage 4 chronic kidney disease, or unspecified chronic kidney disease: Secondary | ICD-10-CM | POA: Diagnosis not present

## 2019-04-23 DIAGNOSIS — K219 Gastro-esophageal reflux disease without esophagitis: Secondary | ICD-10-CM | POA: Insufficient documentation

## 2019-04-23 DIAGNOSIS — Z79899 Other long term (current) drug therapy: Secondary | ICD-10-CM | POA: Diagnosis not present

## 2019-04-23 DIAGNOSIS — N189 Chronic kidney disease, unspecified: Secondary | ICD-10-CM | POA: Insufficient documentation

## 2019-04-23 DIAGNOSIS — G4730 Sleep apnea, unspecified: Secondary | ICD-10-CM | POA: Insufficient documentation

## 2019-04-23 DIAGNOSIS — R1033 Periumbilical pain: Secondary | ICD-10-CM | POA: Insufficient documentation

## 2019-04-23 DIAGNOSIS — E119 Type 2 diabetes mellitus without complications: Secondary | ICD-10-CM | POA: Diagnosis not present

## 2019-04-23 DIAGNOSIS — K3189 Other diseases of stomach and duodenum: Secondary | ICD-10-CM | POA: Insufficient documentation

## 2019-04-23 DIAGNOSIS — F419 Anxiety disorder, unspecified: Secondary | ICD-10-CM | POA: Diagnosis not present

## 2019-04-23 DIAGNOSIS — K2970 Gastritis, unspecified, without bleeding: Secondary | ICD-10-CM | POA: Insufficient documentation

## 2019-04-23 DIAGNOSIS — I10 Essential (primary) hypertension: Secondary | ICD-10-CM | POA: Diagnosis not present

## 2019-04-23 DIAGNOSIS — M1990 Unspecified osteoarthritis, unspecified site: Secondary | ICD-10-CM | POA: Insufficient documentation

## 2019-04-23 DIAGNOSIS — K5790 Diverticulosis of intestine, part unspecified, without perforation or abscess without bleeding: Secondary | ICD-10-CM | POA: Diagnosis not present

## 2019-04-23 HISTORY — PX: ESOPHAGOGASTRODUODENOSCOPY (EGD) WITH PROPOFOL: SHX5813

## 2019-04-23 HISTORY — PX: COLONOSCOPY WITH PROPOFOL: SHX5780

## 2019-04-23 HISTORY — DX: Headache, unspecified: R51.9

## 2019-04-23 LAB — GLUCOSE, CAPILLARY
Glucose-Capillary: 57 mg/dL — ABNORMAL LOW (ref 70–99)
Glucose-Capillary: 64 mg/dL — ABNORMAL LOW (ref 70–99)

## 2019-04-23 SURGERY — COLONOSCOPY WITH PROPOFOL
Anesthesia: General

## 2019-04-23 MED ORDER — LIDOCAINE HCL (PF) 2 % IJ SOLN
INTRAMUSCULAR | Status: AC
  Filled 2019-04-23: qty 10

## 2019-04-23 MED ORDER — EPHEDRINE SULFATE 50 MG/ML IJ SOLN
INTRAMUSCULAR | Status: DC | PRN
  Administered 2019-04-23: 5 mg via INTRAVENOUS

## 2019-04-23 MED ORDER — MIDAZOLAM HCL 2 MG/2ML IJ SOLN
INTRAMUSCULAR | Status: DC | PRN
  Administered 2019-04-23: 1 mg via INTRAVENOUS

## 2019-04-23 MED ORDER — LIDOCAINE HCL (CARDIAC) PF 100 MG/5ML IV SOSY
PREFILLED_SYRINGE | INTRAVENOUS | Status: DC | PRN
  Administered 2019-04-23: 50 mg via INTRAVENOUS

## 2019-04-23 MED ORDER — DEXTROSE-NACL 5-0.9 % IV SOLN: INTRAVENOUS | Status: DC

## 2019-04-23 MED ORDER — BUTAMBEN-TETRACAINE-BENZOCAINE 2-2-14 % EX AERO
INHALATION_SPRAY | CUTANEOUS | Status: AC
  Filled 2019-04-23: qty 5

## 2019-04-23 MED ORDER — SODIUM CHLORIDE 0.9 % IV SOLN: INTRAVENOUS | Status: DC

## 2019-04-23 MED ORDER — FENTANYL CITRATE (PF) 100 MCG/2ML IJ SOLN
INTRAMUSCULAR | Status: AC
  Filled 2019-04-23: qty 2

## 2019-04-23 MED ORDER — FENTANYL CITRATE (PF) 100 MCG/2ML IJ SOLN
INTRAMUSCULAR | Status: DC | PRN
  Administered 2019-04-23: 50 ug via INTRAVENOUS

## 2019-04-23 MED ORDER — DEXTROSE-NACL 5-0.45 % IV SOLN
INTRAVENOUS | Status: DC
  Administered 2019-04-23: 08:00:00 via INTRAVENOUS

## 2019-04-23 MED ORDER — EPHEDRINE SULFATE 50 MG/ML IJ SOLN
INTRAMUSCULAR | Status: AC
  Filled 2019-04-23: qty 1

## 2019-04-23 MED ORDER — PROPOFOL 500 MG/50ML IV EMUL
INTRAVENOUS | Status: DC | PRN
  Administered 2019-04-23: 100 ug/kg/min via INTRAVENOUS

## 2019-04-23 MED ORDER — MIDAZOLAM HCL 2 MG/2ML IJ SOLN
INTRAMUSCULAR | Status: AC
  Filled 2019-04-23: qty 2

## 2019-04-23 MED ORDER — PROPOFOL 500 MG/50ML IV EMUL
INTRAVENOUS | Status: AC
  Filled 2019-04-23: qty 50

## 2019-04-23 NOTE — Op Note (Signed)
[facility] Patient Name: Patrick Turner Procedure Date: 04/23/2019 7:32 AM MRN: [id number] Account #: [id number] Date of Birth: [birth year] Admit Type: Outpatient Age: 73 Room: [facility] ENDO ROOM 2 Gender: Male Note Status: Finalized Procedure:            Colonoscopy Indications:          Periumbilical abdominal pain, Lower abdominal pain,                        Iron deficiency anemia Providers:            Lollie Sails, MD Referring MD:         Ocie Cornfield. Ouida Sills MD, MD (Referring MD) Medicines:            Monitored Anesthesia Care Complications:        No immediate complications. Procedure:            Pre-Anesthesia Assessment:                       - ASA Grade Assessment: III - A patient with severe                        systemic disease.                       After obtaining informed consent, the colonoscope was                        passed under direct vision. Throughout the procedure,                        the patient's blood pressure, pulse, and oxygen                        saturations were monitored continuously. The                        Colonoscope was introduced through the anus and                        advanced to the the cecum, identified by appendiceal                        orifice and ileocecal valve. The colonoscopy was                        performed without difficulty. The patient tolerated the                        procedure well. The quality of the bowel preparation                        was good. Findings:      Multiple small-mouthed diverticula were found in the sigmoid colon and       descending colon.      The exam was otherwise normal throughout the examined colon.      The retroflexed view of the distal rectum and anal verge was normal and       showed no anal or rectal abnormalities.      The digital rectal exam was normal, note mild firmness of  the prostate. Impression:           - Diverticulosis in the  sigmoid colon and in the                        descending colon.                       - The distal rectum and anal verge are normal on                        retroflexion view.                       - No specimens collected. Recommendation:       - Discharge patient to home.                       - Advance diet as tolerated.                       - Refer to vascular surgery at appointment to be                        scheduled.                       - If hemoccult positive, consider small bowel series                        and video capsule endoscopy. Procedure Code(s):    --- Professional ---                       [phone], Colonoscopy, flexible; diagnostic, including                        collection of specimen(s) by brushing or washing, when                        performed (separate procedure) Diagnosis Code(s):    --- Professional ---                       V67.20, Periumbilical pain                       R10.30, Lower abdominal pain, unspecified                       D50.9, Iron deficiency anemia, unspecified                       K57.30, Diverticulosis of large intestine without                        perforation or abscess without bleeding CPT copyright 2019 American Medical Association. All rights reserved. The codes documented in this report are preliminary and upon coder review may  be revised to meet current compliance requirements. Lollie Sails, MD 04/23/2019 8:42:32 AM This report has been signed electronically. Number of Addenda: 0 Note Initiated On: 04/23/2019 7:32 AM Scope Withdrawal Time: 0 hours 10 minutes 31 seconds  Total Procedure Duration: 0 hours 17 minutes 19 seconds       [facility]

## 2019-04-23 NOTE — Anesthesia Preprocedure Evaluation (Signed)
Anesthesia Evaluation  Patient identified by MRN, date of birth, ID band Patient awake    Reviewed: Allergy & Precautions, NPO status , Patient's Chart, lab work & pertinent test results  History of Anesthesia Complications Negative for: history of anesthetic complications  Airway Mallampati: II       Dental   Pulmonary sleep apnea (resolved according to pt) ,           Cardiovascular hypertension, Pt. on medications (-) Past MI and (-) CHF (-) dysrhythmias (-) Valvular Problems/Murmurs     Neuro/Psych neg Seizures Anxiety Depression TIA (weak arms, speech difficulty, resolved on arrival at hospital)   GI/Hepatic Neg liver ROS, GERD  Medicated and Controlled,  Endo/Other  diabetes, Type 2, Oral Hypoglycemic Agents, Insulin Dependent  Renal/GU Renal InsufficiencyRenal disease     Musculoskeletal   Abdominal   Peds  Hematology  (+) anemia ,   Anesthesia Other Findings   Reproductive/Obstetrics                             Anesthesia Physical Anesthesia Plan  ASA: III  Anesthesia Plan: General   Post-op Pain Management:    Induction: Intravenous  PONV Risk Score and Plan: 2 and Propofol infusion and TIVA  Airway Management Planned: Nasal Cannula  Additional Equipment:   Intra-op Plan:   Post-operative Plan:   Informed Consent: I have reviewed the patients History and Physical, chart, labs and discussed the procedure including the risks, benefits and alternatives for the proposed anesthesia with the patient or authorized representative who has indicated his/her understanding and acceptance.       Plan Discussed with:   Anesthesia Plan Comments:         Anesthesia Quick Evaluation

## 2019-04-23 NOTE — Transfer of Care (Signed)
Immediate Anesthesia Transfer of Care Note  Patient: Patrick Turner  Procedure(s) Performed: COLONOSCOPY WITH PROPOFOL (N/A ) ESOPHAGOGASTRODUODENOSCOPY (EGD) WITH PROPOFOL (N/A )  Patient Location: PACU  Anesthesia Type:General  Level of Consciousness: awake and sedated  Airway & Oxygen Therapy: Patient Spontanous Breathing and Patient connected to nasal cannula oxygen  Post-op Assessment: Report given to RN and Post -op Vital signs reviewed and stable  Post vital signs: Reviewed and stable  Last Vitals:  Vitals Value Taken Time  BP 102/44 04/23/19 0841  Temp 36.1 C 04/23/19 0841  Pulse 72 04/23/19 0842  Resp 21 04/23/19 0842  SpO2 98 % 04/23/19 0842  Vitals shown include unvalidated device data.  Last Pain:  Vitals:   04/23/19 0841  TempSrc: Tympanic  PainSc:          Complications: No apparent anesthesia complications

## 2019-04-23 NOTE — Anesthesia Procedure Notes (Signed)
Performed by: Vaughan Sine Pre-anesthesia Checklist: Patient identified, Emergency Drugs available, Suction available, Patient being monitored and Timeout performed Oxygen Delivery Method: Nasal cannula Preoxygenation: Pre-oxygenation with 100% oxygen Induction Type: IV induction Airway Equipment and Method: Bite block Placement Confirmation: CO2 detector and positive ETCO2

## 2019-04-23 NOTE — Anesthesia Post-op Follow-up Note (Signed)
Anesthesia QCDR form completed.        

## 2019-04-23 NOTE — Anesthesia Postprocedure Evaluation (Signed)
Anesthesia Post Note  Patient: Patrick Turner  Procedure(s) Performed: COLONOSCOPY WITH PROPOFOL (N/A ) ESOPHAGOGASTRODUODENOSCOPY (EGD) WITH PROPOFOL (N/A )  Patient location during evaluation: Endoscopy Anesthesia Type: General Level of consciousness: awake and alert Pain management: pain level controlled Vital Signs Assessment: post-procedure vital signs reviewed and stable Respiratory status: spontaneous breathing and respiratory function stable Cardiovascular status: stable Anesthetic complications: no     Last Vitals:  Vitals:   04/23/19 0841 04/23/19 0851  BP: (!) 102/44 (!) 128/57  Pulse: 73 72  Resp: 12 14  Temp: (!) 36.1 C   SpO2: 100%     Last Pain:  Vitals:   04/23/19 0841  TempSrc: Tympanic  PainSc:                  KEPHART,WILLIAM K

## 2019-04-23 NOTE — H&P (Signed)
Outpatient short stay form Pre-procedure 04/23/2019 7:39 AM Lollie Sails MD  Primary Physician: Frazier Richards  Reason for visit: EGD and colonoscopy  History of present illness: Patient is a 73 year old male presenting today for an EGD and colonoscopy.  He has a recent history of iron deficiency anemia.  He has been on a PPI.  He has a history of insulin-dependent diabetes.  He relates having lower abdominal pain as well as some dark stools at times black.  He says he has periumbilical pain that will occur after eating sometimes even before he is done eating.  Has lost a small amount of weight but could not quantify it.  He had a CT scan on 01/25/2019 for abdominal pain.  This showed diverticulosis without evidence of diverticulitis.  There was a note of calcified coronary arteries and aortic atherosclerosis.  He tolerated his prep well.  He takes a daily 81 mg aspirin that is been held for several days.  He takes no other aspirin product or blood thinning agent.    Current Facility-Administered Medications:  .  0.9 %  sodium chloride infusion, , Intravenous, Continuous, Lollie Sails, MD .  butamben-tetracaine-benzocaine (CETACAINE) 10-14-12 % spray, , , ,  .  dextrose 5 %-0.45 % sodium chloride infusion, , Intravenous, Continuous, [place], Jamse Mead, MD, Last Rate: 20 mL/hr at 04/23/19 0735  Medications Prior to Admission  Medication Sig Dispense Refill Last Dose  . aspirin 81 MG tablet Take 1 tablet (81 mg total) by mouth daily.   04/22/2019 at Unknown time  . atorvastatin (LIPITOR) 80 MG tablet Take 80 mg by mouth at bedtime.   04/22/2019 at Unknown time  . b complex vitamins capsule Take 1 capsule by mouth daily.   04/22/2019 at Unknown time  . clonazePAM (KLONOPIN) 1 MG tablet Take 0.5 mg by mouth at bedtime as needed for anxiety.   2 04/22/2019 at Unknown time  . dicyclomine (BENTYL) 10 MG capsule Take 10 mg by mouth 4 (four) times daily -  before meals and at bedtime. As  needed for abd pain   04/22/2019 at Unknown time  . gabapentin (NEURONTIN) 400 MG capsule Take 400 mg by mouth 2 (two) times daily.   04/22/2019 at Unknown time  . insulin detemir (LEVEMIR) 100 UNIT/ML injection Inject 15 Units into the skin daily.   04/22/2019 at Unknown time  . iron polysaccharides (NIFEREX) 150 MG capsule Take 150 mg by mouth daily.   04/22/2019 at Unknown time  . lisinopril-hydrochlorothiazide (PRINZIDE,ZESTORETIC) 20-12.5 MG per tablet Take 1 tablet by mouth daily.    04/22/2019 at Unknown time  . metFORMIN (GLUCOPHAGE-XR) 500 MG 24 hr tablet Take 1 tablet (500 mg total) by mouth 2 (two) times a day. (Patient taking differently: Take 1,000 mg by mouth 2 (two) times a day. ) 60 tablet 0 Past Week at Unknown time  . Omega-3 Fatty Acids (FISH OIL) 1000 MG CAPS Take 1 capsule by mouth daily.    Past Week at Unknown time  . OZEMPIC, 0.25 OR 0.5 MG/DOSE, 2 MG/1.5ML SOPN Inject into the skin once a week. Inject 0.1875-0.375mLs (0.25-0.5mg  total) subcutaneously   Past Week at Unknown time  . pantoprazole (PROTONIX) 40 MG tablet Take 40 mg by mouth daily.   04/22/2019 at Unknown time  . PARoxetine (PAXIL) 40 MG tablet Take 40 mg by mouth daily.    04/22/2019 at Unknown time  . pioglitazone (ACTOS) 15 MG tablet Take 15 mg by mouth daily.  04/22/2019 at Unknown time  . QUEtiapine (SEROQUEL) 100 MG tablet Take 100 mg by mouth at bedtime.   04/22/2019 at Unknown time  . topiramate (TOPAMAX) 100 MG tablet Take 100 mg by mouth daily.    04/22/2019 at Unknown time  . vitamin B-12 (CYANOCOBALAMIN) 1000 MCG tablet Take 1,000 mcg by mouth daily.   Past Week at Unknown time  . Glucose Blood (ACCU-CHEK AVIVA PLUS VI) by In Vitro route.     Marland Kitchen omeprazole (PRILOSEC) 40 MG capsule Take 40 mg by mouth daily.    Not Taking at Unknown time     Allergies  Allergen Reactions  . Penicillins Anaphylaxis    Has patient had a PCN reaction causing immediate rash, facial/tongue/throat swelling, SOB or  lightheadedness with hypotension: Yes Has patient had a PCN reaction causing severe rash involving mucus membranes or skin necrosis: No Has patient had a PCN reaction that required hospitalization Yes Has patient had a PCN reaction occurring within the last 10 years: No If all of the above answers are "NO", then may proceed with Cephalosporin use.  . Clemastine Swelling  . Loratadine      Past Medical History:  Diagnosis Date  . Altered mental status   . Anemia   . Anxiety   . Aortic atherosclerosis ([place])   . Arthritis   . Coronary artery disease   . CRD (chronic renal disease)   . Depression   . Diabetes mellitus without complication ([place])   . Encephalopathy acute   . GERD (gastroesophageal reflux disease)   . Headache   . Hypercholesteremia   . Hypertension   . Neuropathy   . Severe obesity ([place])   . Sleep apnea   . Sleep terror    per patient this year per patient     Review of systems:      Physical Exam    Heart and lungs: Regular rate and rhythm without rub or gallop lungs are bilaterally clear.    HEENT: Normocephalic atraumatic eyes are anicteric    Other:    Pertinant exam for procedure: Soft nontender nondistended bowel sounds positive normoactive    Planned proceedures: EGD, colonoscopy and indicated procedures. I have discussed the risks benefits and complications of procedures to include not limited to bleeding, infection, perforation and the risk of sedation and the patient wishes to proceed.    Lollie Sails, MD Gastroenterology 04/23/2019  7:39 AM

## 2019-04-23 NOTE — Op Note (Signed)
[facility] Gastroenterology Patient Name: Patrick Turner Procedure Date: 04/23/2019 7:33 AM MRN: [id number] Account #: [id number] Date of Birth: [birth year] Admit Type: Outpatient Age: 73 Room: [facility] ENDO ROOM 2 Gender: Male Note Status: Finalized Procedure:            Upper GI endoscopy Indications:          Periumbilical abdominal pain, Lower abdominal pain,                        Iron deficiency anemia Providers:            Lollie Sails, MD Referring MD:         Ocie Cornfield. Ouida Sills MD, MD (Referring MD) Medicines:            Monitored Anesthesia Care Complications:        No immediate complications. Procedure:            Pre-Anesthesia Assessment:                       - ASA Grade Assessment: III - A patient with severe                        systemic disease.                       After obtaining informed consent, the endoscope was                        passed under direct vision. Throughout the procedure,                        the patient's blood pressure, pulse, and oxygen                        saturations were monitored continuously. The Endoscope                        was introduced through the mouth, and advanced to the                        third part of duodenum. The upper GI endoscopy was                        accomplished without difficulty. The patient tolerated                        the procedure well. Findings:      A small hiatal hernia was found. The Z-line was a variable distance from       incisors; the hiatal hernia was sliding.      The Z-line was variable. Biopsies were taken with a cold forceps for       histology.      Patchy minimal inflammation characterized by erythema was found at the       pylorus. Biopsies were taken with a cold forceps for histology. Biopsies       were taken with a cold forceps for Helicobacter pylori testing.      The cardia and gastric fundus were normal on retroflexion otherwise.      The exam of  the stomach was otherwise normal.      Biopsies  were taken with a cold forceps in the gastric body for       histology.      Patchy minimal erythematous mucosa without active bleeding and with no       stigmata of bleeding was found in the duodenal bulb.      The exam of the duodenum was otherwise normal. Impression:           - Small hiatal hernia.                       - Z-line variable. Biopsied.                       - Gastritis. Biopsied.                       - Erythematous duodenopathy.                       - Biopsies were taken with a cold forceps for histology                        in the gastric body. Recommendation:       - Continue present medications.                       - Await pathology results.                       - Telephone GI clinic for pathology results in 5 days. Procedure Code(s):    --- Professional ---                       [phone], Esophagogastroduodenoscopy, flexible, transoral;                        with biopsy, single or multiple Diagnosis Code(s):    --- Professional ---                       K44.9, Diaphragmatic hernia without obstruction or                        gangrene                       K22.8, Other specified diseases of esophagus                       K29.70, Gastritis, unspecified, without bleeding                       K31.89, Other diseases of stomach and duodenum                       V95.63, Periumbilical pain                       R10.30, Lower abdominal pain, unspecified                       D50.9, Iron deficiency anemia, unspecified CPT copyright 2019 [employer]. All rights reserved. The codes documented in this report are preliminary and upon coder review may  be revised to meet current compliance requirements. Lollie Sails, MD 04/23/2019  8:14:33 AM This report has been signed electronically. Number of Addenda: 0 Note Initiated On: 04/23/2019 7:33 AM      [facility]

## 2019-04-24 ENCOUNTER — Encounter: Payer: Self-pay | Admitting: Gastroenterology

## 2019-04-24 LAB — SURGICAL PATHOLOGY

## 2019-04-29 DIAGNOSIS — H25812 Combined forms of age-related cataract, left eye: Secondary | ICD-10-CM | POA: Diagnosis not present

## 2019-04-29 DIAGNOSIS — H2512 Age-related nuclear cataract, left eye: Secondary | ICD-10-CM | POA: Diagnosis not present

## 2019-05-02 DIAGNOSIS — E113412 Type 2 diabetes mellitus with severe nonproliferative diabetic retinopathy with macular edema, left eye: Secondary | ICD-10-CM | POA: Diagnosis not present

## 2019-05-03 ENCOUNTER — Ambulatory Visit (INDEPENDENT_AMBULATORY_CARE_PROVIDER_SITE_OTHER): Payer: Medicare HMO | Admitting: Vascular Surgery

## 2019-05-03 ENCOUNTER — Other Ambulatory Visit: Payer: Self-pay

## 2019-05-03 ENCOUNTER — Encounter (INDEPENDENT_AMBULATORY_CARE_PROVIDER_SITE_OTHER): Payer: Self-pay | Admitting: Vascular Surgery

## 2019-05-03 VITALS — BP 177/65 | HR 64 | Resp 10 | Ht 65.0 in | Wt 193.0 lb

## 2019-05-03 DIAGNOSIS — I10 Essential (primary) hypertension: Secondary | ICD-10-CM

## 2019-05-03 DIAGNOSIS — E1122 Type 2 diabetes mellitus with diabetic chronic kidney disease: Secondary | ICD-10-CM | POA: Diagnosis not present

## 2019-05-03 DIAGNOSIS — R109 Unspecified abdominal pain: Secondary | ICD-10-CM | POA: Insufficient documentation

## 2019-05-03 DIAGNOSIS — E1169 Type 2 diabetes mellitus with other specified complication: Secondary | ICD-10-CM

## 2019-05-03 DIAGNOSIS — E785 Hyperlipidemia, unspecified: Secondary | ICD-10-CM | POA: Diagnosis not present

## 2019-05-03 DIAGNOSIS — R1084 Generalized abdominal pain: Secondary | ICD-10-CM | POA: Diagnosis not present

## 2019-05-03 DIAGNOSIS — N183 Chronic kidney disease, stage 3 unspecified: Secondary | ICD-10-CM

## 2019-05-03 NOTE — Assessment & Plan Note (Signed)
blood glucose control important in reducing the progression of atherosclerotic disease. Also, involved in wound healing. On appropriate medications.  

## 2019-05-03 NOTE — Progress Notes (Signed)
Patient ID: Patrick Turner, male   DOB: 11/02/1945, 73 y.o.   MRN: [id number]  Chief Complaint  Patient presents with  . New Patient (Initial Visit)    HPI Patrick Turner is a 73 y.o. male.  I am asked to see the patient by Dr. Gustavo Lah for evaluation of chronic mesenteric ischemia.  The patient reports intermittent abdominal pain.  This is most severe after eating and he has been having this for many months.  There is no clear inciting event or causative factor that started the symptoms.  He has lost interest in eating due to discomfort with eating.  As such, he has had unintentional weight loss.  He reports no significant change in his bowel habits.  He denies fever or chills.  He recently underwent a colonoscopy and the referral was made after this for concern for possible vascular ischemia.  He also underwent a CT scan of the abdomen and pelvis a couple of months ago which I have independently reviewed.  This was not a CT angiogram, and the cuts were 5 mm so it was not all that revealing but he does have some significant aortic and branch vessel calcification.     Past Medical History:  Diagnosis Date  . Altered mental status   . Anemia   . Anxiety   . Aortic atherosclerosis ([place])   . Arthritis   . Coronary artery disease   . CRD (chronic renal disease)   . Depression   . Diabetes mellitus without complication ([place])   . Encephalopathy acute   . GERD (gastroesophageal reflux disease)   . Headache   . Hypercholesteremia   . Hypertension   . Neuropathy   . Severe obesity ([place])   . Sleep apnea   . Sleep terror    per patient this year per patient     Past Surgical History:  Procedure Laterality Date  . APPENDECTOMY    . CIRCUMCISION    . COLONOSCOPY    . COLONOSCOPY WITH PROPOFOL N/A 07/10/2018   Procedure: COLONOSCOPY WITH PROPOFOL;  Surgeon: Lollie Sails, MD;  Location[facility] ENDOSCOPY;  Service: Endoscopy;  Laterality: N/A;  . COLONOSCOPY WITH PROPOFOL N/A  04/23/2019   Procedure: COLONOSCOPY WITH PROPOFOL;  Surgeon: Lollie Sails, MD;  Location: [facility] ENDOSCOPY;  Service: Endoscopy;  Laterality: N/A;  . ESOPHAGOGASTRODUODENOSCOPY    . ESOPHAGOGASTRODUODENOSCOPY (EGD) WITH PROPOFOL N/A 04/23/2019   Procedure: ESOPHAGOGASTRODUODENOSCOPY (EGD) WITH PROPOFOL;  Surgeon: Lollie Sails, MD;  Location: [facility] ENDOSCOPY;  Service: Endoscopy;  Laterality: N/A;  . JOINT REPLACEMENT    . 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 KNEE ARTHROSCOPY Right   . TONSILLECTOMY      Family History Family History  Problem Relation Age of Onset  . Diabetes Mother   . Heart disease Mother   . Cancer Father   . COPD Father   No bleeding or clotting disorders  Social History Social History   Tobacco Use  . Smoking status: Never Smoker  . Smokeless tobacco: Never Used  Substance Use Topics  . Alcohol use: Yes    Comment: rare  . Drug use: No    Allergies  Allergen Reactions  . Penicillins Anaphylaxis    Has patient had a PCN reaction causing immediate rash, facial/tongue/throat swelling, SOB or lightheadedness with hypotension: Yes Has patient had a  PCN reaction causing severe rash involving mucus membranes or skin necrosis: No Has patient had a PCN reaction that required hospitalization Yes Has patient had a PCN reaction occurring within the last 10 years: No If all of the above answers are "NO", then may proceed with Cephalosporin use.  . Tavist [Clemastine] Swelling  . Loratadine     Current Outpatient Medications  Medication Sig Dispense Refill  . aspirin 81 MG tablet Take 1 tablet (81 mg total) by mouth daily.    Marland Kitchen atorvastatin (LIPITOR) 80 MG tablet Take 80 mg by mouth at bedtime.    Marland Kitchen b complex vitamins capsule Take 1 capsule by mouth daily.    . clonazePAM (KLONOPIN) 1 MG tablet Take 0.5  mg by mouth at bedtime as needed for anxiety.   2  . gabapentin (NEURONTIN) 400 MG capsule Take 400 mg by mouth 2 (two) times daily.    . insulin detemir (LEVEMIR) 100 UNIT/ML injection Inject 15 Units into the skin daily.    . iron polysaccharides (NIFEREX) 150 MG capsule Take 150 mg by mouth daily.    Marland Kitchen lisinopril-hydrochlorothiazide (PRINZIDE,ZESTORETIC) 20-12.5 MG per tablet Take 1 tablet by mouth daily.     . metFORMIN (GLUCOPHAGE-XR) 500 MG 24 hr tablet Take 1 tablet (500 mg total) by mouth 2 (two) times a day. (Patient taking differently: Take 1,000 mg by mouth 2 (two) times a day. ) 60 tablet 0  . Omega-3 Fatty Acids (FISH OIL) 1000 MG CAPS Take 1 capsule by mouth daily.     Marland Kitchen omeprazole (PRILOSEC) 40 MG capsule Take 40 mg by mouth daily.     Marland Kitchen OZEMPIC, 0.25 OR 0.5 MG/DOSE, 2 MG/1.5ML SOPN Inject into the skin once a week. Inject 0.1875-0.375mLs (0.25-0.5mg  total) subcutaneously    . PARoxetine (PAXIL) 40 MG tablet Take 40 mg by mouth daily.     . QUEtiapine (SEROQUEL) 100 MG tablet Take 100 mg by mouth at bedtime.    . topiramate (TOPAMAX) 100 MG tablet Take 100 mg by mouth daily.     . vitamin B-12 (CYANOCOBALAMIN) 1000 MCG tablet Take 1,000 mcg by mouth daily.    Marland Kitchen dicyclomine (BENTYL) 10 MG capsule Take 10 mg by mouth 4 (four) times daily -  before meals and at bedtime. As needed for abd pain    . Glucose Blood (ACCU-CHEK AVIVA PLUS VI) by In Vitro route.    . pantoprazole (PROTONIX) 40 MG tablet Take 40 mg by mouth daily.    . pioglitazone (ACTOS) 15 MG tablet Take 15 mg by mouth daily.      No current facility-administered medications for this visit.       REVIEW OF SYSTEMS (Negative unless checked)  Constitutional: [x] Weight loss  [] Fever  [] Chills Cardiac: [] Chest pain   [] Chest pressure   [] Palpitations   [] Shortness of breath when laying flat   [] Shortness of breath at rest   [] Shortness of breath with exertion. Vascular:  [] Pain in legs with walking   [] Pain in legs at  rest   [] Pain in legs when laying flat   [] Claudication   [] Pain in feet when walking  [] Pain in feet at rest  [] Pain in feet when laying flat   [] History of DVT   [] Phlebitis   [x] Swelling in legs   [] Varicose veins   [] Non-healing ulcers Pulmonary:   [] Uses home oxygen   [] Productive cough   [] Hemoptysis   [] Wheeze  [] COPD   [] Asthma Neurologic:  [] Dizziness  [] Blackouts   [] Seizures   []   History of stroke   [] History of TIA  [] Aphasia   [] Temporary blindness   [] Dysphagia   [] Weakness or numbness in arms   [] Weakness or numbness in legs Musculoskeletal:  [x] Arthritis   [] Joint swelling   [] Joint pain   [] Low back pain Hematologic:  [] Easy bruising  [] Easy bleeding   [] Hypercoagulable state   [x] Anemic  [] Hepatitis Gastrointestinal:  [] Blood in stool   [] Vomiting blood  [x] Gastroesophageal reflux/heartburn   [x] Abdominal pain Genitourinary:  [x] Chronic kidney disease   [] Difficult urination  [] Frequent urination  [] Burning with urination   [] Hematuria Skin:  [] Rashes   [] Ulcers   [] Wounds Psychological:  [x] History of anxiety   []  History of major depression.    Physical Exam BP (!) 177/65 (BP Location: Left Wrist, Patient Position: Sitting, Cuff Size: Normal)   Pulse 64   Resp 10   Ht 5\' 5"  (1.651 m)   Wt 193 lb (87.5 kg)   BMI 32.12 kg/m  Gen:  WD/WN, NAD Head: [place]/AT, No temporalis wasting.  Ear/Nose/Throat: Hearing grossly intact, nares w/o erythema or drainage, oropharynx w/o Erythema/Exudate Eyes: Conjunctiva clear, sclera non-icteric  Neck: trachea midline.  No JVD.  Pulmonary:  Good air movement, respirations not labored, no use of accessory muscles  Cardiac: RRR, no JVD Vascular:  Vessel Right Left  Radial Palpable Palpable                          DP 1+ 1+  PT 1+ NP   Gastrointestinal:. No masses, surgical incisions, or scars.  Mild tenderness to palpation but no distention or guarding. Musculoskeletal: M/S 5/5 throughout.  Extremities without ischemic changes.  No  deformity or atrophy. Mild LE edema. Neurologic: Sensation grossly intact in extremities.  Symmetrical.  Speech is fluent. Motor exam as listed above. Psychiatric: Judgment intact, Mood & affect appropriate for pt's clinical situation. Dermatologic: No rashes or ulcers noted.  No cellulitis or open wounds.    Radiology No results found.  Labs Recent Results (from the past 2160 hour(s))  Lipase, blood     Status: None   Collection Time: 03/23/19  2:11 PM  Result Value Ref Range   Lipase 23 11 - 51 U/L    Comment: Performed at [facility], [address] [place], [place] [address]  Comprehensive metabolic panel     Status: Abnormal   Collection Time: 03/23/19  2:11 PM  Result Value Ref Range   Sodium 137 135 - 145 mmol/L   Potassium 4.6 3.5 - 5.1 mmol/L   Chloride 106 98 - 111 mmol/L   CO2 20 (L) 22 - 32 mmol/L   Glucose, Bld 103 (H) 70 - 99 mg/dL   BUN 49 (H) 8 - 23 mg/dL   Creatinine, Ser 2.30 (H) 0.61 - 1.24 mg/dL   Calcium 8.9 8.9 - 10.3 mg/dL   Total Protein 7.3 6.5 - 8.1 g/dL   Albumin 4.1 3.5 - 5.0 g/dL   AST 17 15 - 41 U/L   ALT 14 0 - 44 U/L   Alkaline Phosphatase 54 38 - 126 U/L   Total Bilirubin 0.5 0.3 - 1.2 mg/dL   GFR calc non Af Amer 27 (L) >60 mL/min   GFR calc Af Amer 31 (L) >60 mL/min   Anion gap 11 5 - 15    Comment: Performed at [facility], [address], [place], [place] [address]  CBC     Status: Abnormal   Collection Time: 03/23/19  2:11 PM  Result Value Ref Range   WBC 6.2 4.0 - 10.5 K/uL   RBC 3.91 (L) 4.22 - 5.81 MIL/uL   Hemoglobin 9.3 (L) 13.0 - 17.0 g/dL   HCT 30.7 (L) 39.0 - 52.0 %   MCV 78.5 (L) 80.0 - 100.0 fL   MCH 23.8 (L) 26.0 - 34.0 pg   MCHC 30.3 30.0 - 36.0 g/dL   RDW 17.9 (H) 11.5 - 15.5 %   Platelets 239 150 - 400 K/uL   nRBC 0.0 0.0 - 0.2 %    Comment: Performed at [facility], [place], [place] [address]  Troponin I (High Sensitivity)     Status: None   Collection Time:  03/23/19  2:11 PM  Result Value Ref Range   Troponin I (High Sensitivity) 6 <18 ng/L    Comment: (NOTE) Elevated high sensitivity troponin I (hsTnI) values and significant  changes across serial measurements may suggest ACS but many other  chronic and acute conditions are known to elevate hsTnI results.  Refer to the "Links" section for chest pain algorithms and additional  guidance. Performed at [facility], [address], [place], [place] [address]   SARS Coronavirus 2 (CEPHEID - Performed in [facility] hospital lab), Hosp Order     Status: None   Collection Time: 03/23/19  2:40 PM   Specimen: Nasopharyngeal Swab  Result Value Ref Range   SARS Coronavirus 2 NEGATIVE NEGATIVE    Comment: (NOTE) If result is NEGATIVE SARS-CoV-2 target nucleic acids are NOT DETECTED. The SARS-CoV-2 RNA is generally detectable in upper and lower  respiratory specimens during the acute phase of infection. The lowest  concentration of SARS-CoV-2 viral copies this assay can detect is 250  copies / mL. A negative result does not preclude SARS-CoV-2 infection  and should not be used as the sole basis for treatment or other  patient management decisions.  A negative result may occur with  improper specimen collection / handling, submission of specimen other  than nasopharyngeal swab, presence of viral mutation(s) within the  areas targeted by this assay, and inadequate number of viral copies  (<250 copies / mL). A negative result must be combined with clinical  observations, patient history, and epidemiological information. If result is POSITIVE SARS-CoV-2 target nucleic acids are DETECTED. The SARS-CoV-2 RNA is generally detectable in upper and lower  respiratory specimens dur ing the acute phase of infection.  Positive  results are indicative of active infection with SARS-CoV-2.  Clinical  correlation with patient history and other diagnostic information is  necessary to determine patient  infection status.  Positive results do  not rule out bacterial infection or co-infection with other viruses. If result is PRESUMPTIVE POSTIVE SARS-CoV-2 nucleic acids MAY BE PRESENT.   A presumptive positive result was obtained on the submitted specimen  and confirmed on repeat testing.  While 2019 novel coronavirus  (SARS-CoV-2) nucleic acids may be present in the submitted sample  additional confirmatory testing may be necessary for epidemiological  and / or clinical management purposes  to differentiate between  SARS-CoV-2 and other Sarbecovirus currently known to infect humans.  If clinically indicated additional testing with an alternate test  methodology [phone]) is advised. The SARS-CoV-2 RNA is generally  detectable in upper and lower respiratory sp ecimens during the acute  phase of infection. The expected result is Negative. Fact Sheet for Patients:  StrictlyIdeas.no Fact Sheet for Healthcare Providers: BankingDealers.co.za This test is not yet approved or  cleared by the [place] and has been authorized for detection and/or diagnosis of SARS-CoV-2 by FDA under an Emergency Use Authorization (EUA).  This EUA will remain in effect (meaning this test can be used) for the duration of the COVID-19 declaration under Section 564(b)(1) of the Act, 21 U.S.C. section 360bbb-3(b)(1), unless the authorization is terminated or revoked sooner. Performed at [facility], [address] [place], [place] [address]   Urinalysis, Complete w Microscopic     Status: Abnormal   Collection Time: 03/23/19  5:26 PM  Result Value Ref Range   Color, Urine STRAW (A) YELLOW   APPearance CLEAR (A) CLEAR   Specific Gravity, Urine 1.006 1.005 - 1.030   pH 5.0 5.0 - 8.0   Glucose, UA NEGATIVE NEGATIVE mg/dL   Hgb urine dipstick NEGATIVE NEGATIVE   Bilirubin Urine NEGATIVE NEGATIVE   Ketones, ur NEGATIVE NEGATIVE mg/dL   Protein, ur  NEGATIVE NEGATIVE mg/dL   Nitrite NEGATIVE NEGATIVE   Leukocytes,Ua NEGATIVE NEGATIVE   WBC, UA 0-5 0 - 5 WBC/hpf   Bacteria, UA RARE (A) NONE SEEN   Squamous Epithelial / LPF NONE SEEN 0 - 5   Hyaline Casts, UA PRESENT     Comment: Performed at [facility], [address] [place], [place] [address]  Glucose, capillary     Status: None   Collection Time: 03/23/19  8:32 PM  Result Value Ref Range   Glucose-Capillary 84 70 - 99 mg/dL   Comment 1 Notify RN   Basic metabolic panel     Status: Abnormal   Collection Time: 03/24/19  6:08 AM  Result Value Ref Range   Sodium 142 135 - 145 mmol/L   Potassium 4.4 3.5 - 5.1 mmol/L   Chloride 116 (H) 98 - 111 mmol/L   CO2 19 (L) 22 - 32 mmol/L   Glucose, Bld 108 (H) 70 - 99 mg/dL   BUN 41 (H) 8 - 23 mg/dL   Creatinine, Ser 1.58 (H) 0.61 - 1.24 mg/dL   Calcium 8.3 (L) 8.9 - 10.3 mg/dL   GFR calc non Af Amer 43 (L) >60 mL/min   GFR calc Af Amer 50 (L) >60 mL/min   Anion gap 7 5 - 15    Comment: Performed at [facility], [address] [place], [place] [address]  CBC     Status: Abnormal   Collection Time: 03/24/19  6:08 AM  Result Value Ref Range   WBC 5.3 4.0 - 10.5 K/uL   RBC 3.48 (L) 4.22 - 5.81 MIL/uL   Hemoglobin 8.4 (L) 13.0 - 17.0 g/dL   HCT 27.6 (L) 39.0 - 52.0 %   MCV 79.3 (L) 80.0 - 100.0 fL   MCH 24.1 (L) 26.0 - 34.0 pg   MCHC 30.4 30.0 - 36.0 g/dL   RDW 17.9 (H) 11.5 - 15.5 %   Platelets 209 150 - 400 K/uL   nRBC 0.0 0.0 - 0.2 %    Comment: Performed at [facility], [address] [place], [place] [address]  Glucose, capillary     Status: None   Collection Time: 03/24/19  8:04 AM  Result Value Ref Range   Glucose-Capillary 83 70 - 99 mg/dL  Glucose, capillary     Status: None   Collection Time: 03/24/19 11:44 AM  Result Value Ref Range   Glucose-Capillary 96 70 - 99 mg/dL   Comment 1 Notify RN   SARS CORONAVIRUS 2 Nasal Swab Aptima Multi Swab  Status: None   Collection Time:  04/19/19  1:22 PM   Specimen: Aptima Multi Swab; Nasal Swab  Result Value Ref Range   SARS Coronavirus 2 NEGATIVE NEGATIVE    Comment: (NOTE) SARS-CoV-2 target nucleic acids are NOT DETECTED. The SARS-CoV-2 RNA is generally detectable in upper and lower respiratory specimens during the acute phase of infection. Negative results do not preclude SARS-CoV-2 infection, do not rule out co-infections with other pathogens, and should not be used as the sole basis for treatment or other patient management decisions. Negative results must be combined with clinical observations, patient history, and epidemiological information. The expected result is Negative. Fact Sheet for Patients: SugarRoll.be Fact Sheet for Healthcare Providers: https://www.woods-mathews.com/ This test is not yet approved or cleared by the [place] FDA and  has been authorized for detection and/or diagnosis of SARS-CoV-2 by FDA under an Emergency Use Authorization (EUA). This EUA will remain  in effect (meaning this test can be used) for the duration of the COVID-19 declaration under Section 56 4(b)(1) of the Act, 21 U.S.C. section 360bbb-3(b)(1), unless the authorization is terminated or revoked sooner. Performed at [facility], [place] [address] [place], [place] [address]   Glucose, capillary     Status: Abnormal   Collection Time: 04/23/19  7:13 AM  Result Value Ref Range   Glucose-Capillary 57 (L) 70 - 99 mg/dL  Glucose, capillary     Status: Abnormal   Collection Time: 04/23/19  7:52 AM  Result Value Ref Range   Glucose-Capillary 64 (L) 70 - 99 mg/dL  Surgical pathology     Status: None   Collection Time: 04/23/19  8:04 AM  Result Value Ref Range   SURGICAL PATHOLOGY      Surgical Pathology CASE: [id number] PATIENT: Loistine Chance Surgical Pathology Report     SPECIMEN SUBMITTED: A. Stomach, antrum; cbx B. Stomach, body; cbx C. GEJ; cbx   CLINICAL HISTORY: None provided  PRE-OPERATIVE DIAGNOSIS: IDA  POST-OPERATIVE DIAGNOSIS: Hiatal hernia, minimal duodenitis, diverticulosis     DIAGNOSIS: A. STOMACH, ANTRUM; COLD BIOPSY: - GASTRIC ANTRAL MUCOSA WITH MINIMAL CHRONIC INFLAMMATION AND REACTIVE CHANGES. - NEGATIVE FOR ACTIVE INFLAMMATION AND H PYLORI. - NEGATIVE FOR INTESTINAL METAPLASIA, DYSPLASIA, AND MALIGNANCY.  B. STOMACH, BODY; COLD BIOPSY: - GASTRIC OXYNTIC MUCOSA WITH MILD CHRONIC INFLAMMATION. - NEGATIVE FOR ACTIVE INFLAMMATION AND H PYLORI. - NEGATIVE FOR INTESTINAL METAPLASIA, DYSPLASIA, AND MALIGNANCY.  C. GEJ; COLD BIOPSY: - SQUAMOCOLUMNAR JUNCTION WITH CHRONIC INFLAMMATION. - NEGATIVE FOR INTESTINAL METAPLASIA, DYSPLASIA, AND MALIGNANCY.   GROSS DESCRIPTION: A. Labeled: Antrum C BXs Received: Form alin Tissue fragment(s): 2 Size: 0.3-0.4 cm Description: Tan soft tissue fragments Entirely submitted in 1 cassette.  B. Labeled: Body of stomach C BXs Received: Formalin Tissue fragment(s): 2 Size: 0.3 cm Description: Tan soft tissue fragments Entirely submitted in 1 cassette.  C. Labeled: GEJ C BXs Received: Formalin Tissue fragment(s): Multiple Size: Aggregate, 1.0 x 0.2 x 0.1 cm Description: Tan soft tissue fragments Entirely submitted in 1 cassette.    Final Diagnosis performed by Betsy Pries, MD.   Electronically signed 04/24/2019 11:12:11AM The electronic signature indicates that the named Attending Pathologist has evaluated the specimen  Technical component performed at [facility], [address], [place], [place] [address] Lab: [phone] Dir: Rush Farmer, MD, MMM  Professional component performed at [facility], [facility], [address], [place], [place] [address] Lab: [phone] Dir: Dellia Nims. Rubinas, MD     Assessment/Plan:  HTN (hypertension), benign blood pressure control important in reducing  the progression of atherosclerotic disease. On  appropriate oral medications.   Diabetes mellitus with stage 3 chronic kidney disease (HCC) blood glucose control important in reducing the progression of atherosclerotic disease. Also, involved in wound healing. On appropriate medications.   Hyperlipidemia associated with type 2 diabetes mellitus (HCC) lipid control important in reducing the progression of atherosclerotic disease. Continue statin therapy   Abdominal pain He recently underwent a colonoscopy and the referral was made after this for concern for possible vascular ischemia.  He also underwent a CT scan of the abdomen and pelvis a couple of months ago which I have independently reviewed.  This was not a CT angiogram, and the cuts were 5 mm so it was not all that revealing but he does have some significant aortic and branch vessel calcification.   His symptoms are somewhat concerning for chronic mesenteric ischemia, and as such I have recommended he get a mesenteric duplex in the near future at his convenience.  We will see him back following the study to discuss the results and determine further treatment options.  If significant disease is present particularly in multiple vessels, intervention would likely be performed.      [name] 05/03/2019, 11:27 AM   This note was created with Dragon medical transcription system.  Any errors from dictation are unintentional.

## 2019-05-03 NOTE — Assessment & Plan Note (Signed)
lipid control important in reducing the progression of atherosclerotic disease. Continue statin therapy  

## 2019-05-03 NOTE — Assessment & Plan Note (Signed)
blood pressure control important in reducing the progression of atherosclerotic disease. On appropriate oral medications.  

## 2019-05-03 NOTE — Assessment & Plan Note (Signed)
He recently underwent a colonoscopy and the referral was made after this for concern for possible vascular ischemia.  He also underwent a CT scan of the abdomen and pelvis a couple of months ago which I have independently reviewed.  This was not a CT angiogram, and the cuts were 5 mm so it was not all that revealing but he does have some significant aortic and branch vessel calcification.   His symptoms are somewhat concerning for chronic mesenteric ischemia, and as such I have recommended he get a mesenteric duplex in the near future at his convenience.  We will see him back following the study to discuss the results and determine further treatment options.  If significant disease is present particularly in multiple vessels, intervention would likely be performed.

## 2019-05-03 NOTE — Patient Instructions (Signed)
Chronic Mesenteric Ischemia  Chronic mesenteric ischemia is poor blood flow (circulation) in the vessels that supply blood to the stomach, intestines, and liver (mesenteric organs). When the blood supply is severely restricted, these organs cannot work properly. This condition is also called mesenteric angina, or intestinal angina. This condition is a long-term (chronic) condition. It happens when an artery or vein that provides blood to the mesenteric organs gradually becomes blocked or narrows over time, restricting the blood supply to these organs. What are the causes? This condition is commonly caused by fatty deposits that build up in an artery (plaque), which can narrow the artery and restrict blood flow. Other causes include:  Weakened areas in blood vessel walls (aneurysms).  Conditions that cause twisting or inflammation of blood vessels, such as fibromuscular dysplasia or arteritis.  A disorder in which blood clots form in the veins (venous thrombosis).  Scarring and thickening (fibrosis) of blood vessels caused by radiation therapy.  A tear in the aorta, the body's main artery (aortic dissection).  Blood vessel problems after illegal drug use, such as use of cocaine.  Tumors in the nervous system (neurofibromatosis).  Certain autoimmune diseases, such as lupus. What increases the risk? The following factors may make you more likely to develop this condition:  Being male.  Being over age 50, especially if you have a history of heart problems.  Smoking.  Having congestive heart failure.  Having an irregular heartbeat (arrhythmia).  Having a history of heart attack or stroke.  Having diabetes.  Having high cholesterol.  Having high blood pressure (hypertension).  Being overweight or obese.  Having kidney disease (renal disease) that requires dialysis. What are the signs or symptoms? Symptoms of this condition include:  Pain or cramps in the abdomen that  develop 15-60 minutes after a meal. This pain may last for 1-3 hours. Some people may develop a fear of eating because of this symptom.  Weight loss.  Diarrhea.  Bloody stool.  Nausea.  Vomiting.  Bloating.  Abdominal pain after stress or with exercise. How is this diagnosed? This condition is diagnosed based on:  Your medical history.  A physical exam.  Tests, such as: ? Ultrasound. ? CT scan. ? Blood tests. ? Urine tests. ? An imaging test that involves injecting a dye into your arteries to show blood flow through blood vessels (angiogram). This can help to show if there are any blockages in the vessels that lead to the intestines. ? Passing a small probe through the mouth and into the stomach to measure the output of carbon dioxide (gastric tonometry). This can help to indicate whether there is decreased blood flow to the stomach and intestines. How is this treated? This condition may be treated with:  Dietary changes such as eating smaller, low-fat, meals more frequently.  Lifestyle changes to treat underlying conditions that contribute to the disease, such as high cholesterol and high blood pressure.  Medicines to reduce blood clotting and increase blood flow.  Surgery to remove the blockage, repair arteries or veins, and restore blood flow. This may involve: ? Angioplasty. This is surgery to widen the affected artery, reduce the blockage, and sometimes insert a small, mesh tube (stent). ? Bypass surgery. This may be done to go around (bypass) the blockage and reconnect healthy arteries or veins. ? Placing a stent in the affected area. This may be done to help keep blocked arteries open. Follow these instructions at home: Eating and drinking   Eat a heart-healthy diet. This   includes fresh fruits and vegetables, whole grains, and lean proteins like chicken, fish, and beans.  Avoid foods that contain a lot of: ? Salt (sodium). ? Sugar. ? Saturated fat (such as  red meat). ? Trans fat (such as in fried foods).  Stay hydrated. Drink enough fluid to keep your urine pale yellow. Lifestyle  Stay active and get regular exercise as told by your health care provider. Aim for 150 minutes of moderate activity or 75 minutes of vigorous activity a week. Ask your health care provider what activities and forms of exercise are safe for you.  Maintain a healthy weight.  Work with your health care provider to manage your cholesterol.  Manage any other health problems you have, such as high blood pressure, diabetes, or heart rhythm problems.  Do not use any products that contain nicotine or tobacco, such as cigarettes, e-cigarettes, and chewing tobacco. If you need help quitting, ask your health care provider. General instructions  Take over-the-counter and prescription medicines only as told by your health care provider.  Keep all follow-up visits as told by your health care provider. This is important.  You may need to take actions to prevent or treat constipation, such as: ? Drink enough fluid to keep your urine pale yellow. ? Take over-the-counter or prescription medicines. ? Eat foods that are high in fiber, such as beans, whole grains, and fresh fruits and vegetables. ? Limit foods that are high in fat and processed sugars, such as fried or sweet foods. Contact a health care provider if:  Your symptoms do not improve or they return after treatment.  You have a fever.  You are constipated. Get help right away if you:  Have severe abdominal pain.  Have severe chest pain.  Have shortness of breath.  Feel weak or dizzy.  Have fast or irregular heartbeats (palpitations).  Have numbness or weakness in your face, arm, or leg.  Are confused.  Have trouble speaking or people have trouble understanding what you are saying.  Have trouble urinating.  Have blood in your stool.  Have severe nausea, vomiting, or persistent diarrhea. These  symptoms may represent a serious problem that is an emergency. Do not wait to see if the symptoms will go away. Get medical help right away. Call your local emergency services (911 in the [place] Do not drive yourself to the hospital. Summary  Mesenteric ischemia is poor circulation in the vessels that supply blood to the the stomach, intestines, and liver (mesenteric organs).  This condition happens when an artery or vein that provides blood to the mesenteric organs gradually becomes blocked or narrow, restricting the blood supply to the organs.  This condition is commonly caused by fatty deposits that build up in an artery (plaque), which can narrow the artery and restrict blood flow.  You are more likely to develop this condition if you are over age 50 and have a history of heart problems, high blood pressure, diabetes, or high cholesterol.  This condition is usually treated with medicines, dietary and lifestyle changes, and surgery to remove the blockage, repair arteries or veins, and restore blood flow. This information is not intended to replace advice given to you by your health care provider. Make sure you discuss any questions you have with your health care provider. Document Released: 04/18/2011 Document Revised: 05/04/2018 Document Reviewed: 05/04/2018 Elsevier Patient Education  2020 [employer]vier Inc.  

## 2019-05-06 ENCOUNTER — Ambulatory Visit (INDEPENDENT_AMBULATORY_CARE_PROVIDER_SITE_OTHER): Payer: Medicare HMO | Admitting: Nurse Practitioner

## 2019-05-06 ENCOUNTER — Encounter (INDEPENDENT_AMBULATORY_CARE_PROVIDER_SITE_OTHER): Payer: Self-pay

## 2019-05-06 ENCOUNTER — Other Ambulatory Visit: Payer: Self-pay

## 2019-05-06 ENCOUNTER — Ambulatory Visit (INDEPENDENT_AMBULATORY_CARE_PROVIDER_SITE_OTHER): Payer: Medicare HMO

## 2019-05-06 ENCOUNTER — Encounter (INDEPENDENT_AMBULATORY_CARE_PROVIDER_SITE_OTHER): Payer: Self-pay | Admitting: Nurse Practitioner

## 2019-05-06 ENCOUNTER — Other Ambulatory Visit (INDEPENDENT_AMBULATORY_CARE_PROVIDER_SITE_OTHER): Payer: Self-pay | Admitting: Gastroenterology

## 2019-05-06 VITALS — BP 128/67 | HR 67 | Resp 16 | Ht 65.0 in | Wt 190.0 lb

## 2019-05-06 DIAGNOSIS — N183 Chronic kidney disease, stage 3 (moderate): Secondary | ICD-10-CM

## 2019-05-06 DIAGNOSIS — H2512 Age-related nuclear cataract, left eye: Secondary | ICD-10-CM | POA: Diagnosis not present

## 2019-05-06 DIAGNOSIS — K551 Chronic vascular disorders of intestine: Secondary | ICD-10-CM | POA: Diagnosis not present

## 2019-05-06 DIAGNOSIS — K219 Gastro-esophageal reflux disease without esophagitis: Secondary | ICD-10-CM

## 2019-05-06 DIAGNOSIS — E1122 Type 2 diabetes mellitus with diabetic chronic kidney disease: Secondary | ICD-10-CM

## 2019-05-06 DIAGNOSIS — I10 Essential (primary) hypertension: Secondary | ICD-10-CM

## 2019-05-06 NOTE — Progress Notes (Signed)
SUBJECTIVE:  Patient ID: Patrick Turner, male    DOB: [birth year], 73 y.o.   MRN: [id number] Chief Complaint  Patient presents with  . Follow-up    ultrasound follow up    HPI  Patrick Turner is a 73 y.o. male presents today for follow-up studies regarding his abdominal pain.  The patient has had abdominal pain for many months.  The pain is intermittent and it is most severe after he eats.  He is unsure as to what caused the symptoms as they began out of the blue.  Due to this he has a sort of food phobia, due to the pain and discomfort associated with eating.  He also reports weight loss.  He denies any diarrhea or constipation.  Denies any fever, chills, nausea, vomiting or diarrhea.  He recently underwent a colonoscopy which was not significant.  A CT of the abdomen and pelvis was done however we were not able to get a clear view of the mesenteric arteries.  Today the patient has a normal hepatic, splenic, celiac and inferior mesenteric artery.  The SMA appears to be patent throughout however velocities at the mid SMA are suggestive of a greater than 70% stenosis.  Past Medical History:  Diagnosis Date  . Altered mental status   . Anemia   . Anxiety   . Aortic atherosclerosis ([place])   . Arthritis   . Coronary artery disease   . CRD (chronic renal disease)   . Depression   . Diabetes mellitus without complication ([place])   . Encephalopathy acute   . GERD (gastroesophageal reflux disease)   . Headache   . Hypercholesteremia   . Hypertension   . Neuropathy   . Severe obesity ([place])   . Sleep apnea   . Sleep terror    per patient this year per patient     Past Surgical History:  Procedure Laterality Date  . APPENDECTOMY    . CIRCUMCISION    . COLONOSCOPY    . COLONOSCOPY WITH PROPOFOL N/A 07/10/2018   Procedure: COLONOSCOPY WITH PROPOFOL;  Surgeon: Lollie Sails, MD;  Location: [facility] ENDOSCOPY;  Service: Endoscopy;  Laterality: N/A;  . COLONOSCOPY WITH PROPOFOL N/A  04/23/2019   Procedure: COLONOSCOPY WITH PROPOFOL;  Surgeon: Lollie Sails, MD;  Location: [facility] ENDOSCOPY;  Service: Endoscopy;  Laterality: N/A;  . ESOPHAGOGASTRODUODENOSCOPY    . ESOPHAGOGASTRODUODENOSCOPY (EGD) WITH PROPOFOL N/A 04/23/2019   Procedure: ESOPHAGOGASTRODUODENOSCOPY (EGD) WITH PROPOFOL;  Surgeon: Lollie Sails, MD;  Location: [facility] ENDOSCOPY;  Service: Endoscopy;  Laterality: N/A;  . JOINT REPLACEMENT    . 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 KNEE ARTHROSCOPY Right   . TONSILLECTOMY      Social History   Socioeconomic History  . Marital status: Married    Spouse name: Not on file  . Number of children: Not on file  . Years of education: Not on file  . Highest education level: Not on file  Occupational History  . Occupation: works at Architect  . Financial resource strain: Not on file  . Food insecurity    Worry: Not on file    Inability: Not on file  . Transportation needs    Medical: Not on file    Non-medical: Not on file  Tobacco Use  . Smoking status: Never Smoker  .  Smokeless tobacco: Never Used  Substance and Sexual Activity  . Alcohol use: Yes    Comment: rare  . Drug use: No  . Sexual activity: Not on file  Lifestyle  . Physical activity    Days per week: Not on file    Minutes per session: Not on file  . Stress: Not on file  Relationships  . Social Herbalist on phone: Not on file    Gets together: Not on file    Attends religious service: Not on file    Active member of club or organization: Not on file    Attends meetings of clubs or organizations: Not on file    Relationship status: Not on file  . Intimate partner violence    Fear of current or ex partner: Not on file    Emotionally abused: Not on file    Physically abused: Not on file    Forced  sexual activity: Not on file  Other Topics Concern  . Not on file  Social History Narrative  . Not on file    Family History  Problem Relation Age of Onset  . Diabetes Mother   . Heart disease Mother   . Cancer Father   . COPD Father     Allergies  Allergen Reactions  . Penicillins Anaphylaxis    Has patient had a PCN reaction causing immediate rash, facial/tongue/throat swelling, SOB or lightheadedness with hypotension: Yes Has patient had a PCN reaction causing severe rash involving mucus membranes or skin necrosis: No Has patient had a PCN reaction that required hospitalization Yes Has patient had a PCN reaction occurring within the last 10 years: No If all of the above answers are "NO", then may proceed with Cephalosporin use.  . Tavist [Clemastine] Swelling  . Loratadine      Review of Systems   Review of Systems: Negative Unless Checked Constitutional: [] Weight loss  [] Fever  [] Chills Cardiac: [] Chest pain   []  Atrial Fibrillation  [] Palpitations   [] Shortness of breath when laying flat   [] Shortness of breath with exertion. [] Shortness of breath at rest Vascular:  [] Pain in legs with walking   [] Pain in legs with standing [] Pain in legs when laying flat   [] Claudication    [] Pain in feet when laying flat    [] History of DVT   [] Phlebitis   [x] Swelling in legs   [] Varicose veins   [] Non-healing ulcers Pulmonary:   [] Uses home oxygen   [] Productive cough   [] Hemoptysis   [] Wheeze  [] COPD   [] Asthma Neurologic:  [] Dizziness   [] Seizures  [] Blackouts [] History of stroke   [] History of TIA  [] Aphasia   [] Temporary Blindness   [] Weakness or numbness in arm   [] Weakness or numbness in leg Musculoskeletal:   [] Joint swelling   [] Joint pain   [] Low back pain  []  History of Knee Replacement [] Arthritis [] back Surgeries  []  Spinal Stenosis    Hematologic:  [] Easy bruising  [] Easy bleeding   [] Hypercoagulable state   [x] Anemic Gastrointestinal:  [] Diarrhea   [] Vomiting   [] Gastroesophageal reflux/heartburn   [] Difficulty swallowing. [x] Abdominal pain Genitourinary:  [x] Chronic kidney disease   [] Difficult urination  [] Anuric   [] Blood in urine [] Frequent urination  [] Burning with urination   [] Hematuria Skin:  [] Rashes   [] Ulcers [] Wounds Psychological:  [x] History of anxiety   []  History of major depression  []  Memory Difficulties      OBJECTIVE:   Physical Exam  BP 128/67 (BP Location: Right Arm)  Pulse 67   Resp 16   Ht 5\' 5"  (1.651 m)   Wt 190 lb (86.2 kg)   BMI 31.62 kg/m   Gen: WD/WN, NAD Head: [place]/AT, No temporalis wasting.  Ear/Nose/Throat: Hearing grossly intact, nares w/o erythema or drainage Eyes: PER, EOMI, sclera nonicteric.  Neck: Supple, no masses.  No JVD.  Pulmonary:  Good air movement, no use of accessory muscles.  Cardiac: RRR Vascular:  1+ edema bilaterally Vessel Right Left  Radial Palpable Palpable  Dorsalis Pedis Palpable Palpable  Posterior Tibial Palpable Palpable   Gastrointestinal: soft, non-distended. No guarding/no peritoneal signs.  Musculoskeletal: M/S 5/5 throughout.  No deformity or atrophy.  Neurologic: Pain and light touch intact in extremities.  Symmetrical.  Speech is fluent. Motor exam as listed above. Psychiatric: Judgment intact, Mood & affect appropriate for pt's clinical situation. Dermatologic: No Venous rashes. No Ulcers Noted.  No changes consistent with cellulitis. Lymph : No Cervical lymphadenopathy, no lichenification or skin changes of chronic lymphedema.       ASSESSMENT AND PLAN:  1. Chronic mesenteric ischemia (HCC) Today the patient has a normal hepatic, splenic, celiac and inferior mesenteric artery.  The SMA appears to be patent throughout however velocities at the mid SMA are suggestive of a greater than 70% stenosis.  Recommend:  The patient has evidence of severe atherosclerotic changes of the mesenteric arteries associated with weight loss as well as abdominal pain and N/V.   This represents a high risk for bowel infarction and death.  Patient should undergo angiography of the mesenteric arteries with the hope for intervention to eliminate the ischemic symptoms.    The risks and benefits as well as the alternative therapies was discussed in detail with the patient.  All questions were answered.  Patient agrees to proceed with angiography and intervention.  The patient will follow up with me after the angiogram. 2. HTN (hypertension), benign Continue antihypertensive medications as already ordered, these medications have been reviewed and there are no changes at this time.   3. Gastroesophageal reflux disease without esophagitis Continue PPI as already ordered, this medication has been reviewed and there are no changes at this time.  Avoidence of caffeine and alcohol  Moderate elevation of the head of the bed   4. Diabetes mellitus with stage 3 chronic kidney disease (HCC) Continue hypoglycemic medications as already ordered, these medications have been reviewed and there are no changes at this time.  Hgb A1C to be monitored as already arranged by primary service    Current Outpatient Medications on File Prior to Visit  Medication Sig Dispense Refill  . aspirin 81 MG tablet Take 1 tablet (81 mg total) by mouth daily.    Marland Kitchen atorvastatin (LIPITOR) 80 MG tablet Take 80 mg by mouth at bedtime.    Marland Kitchen b complex vitamins capsule Take 1 capsule by mouth daily.    . clonazePAM (KLONOPIN) 1 MG tablet Take 0.5 mg by mouth at bedtime as needed for anxiety.   2  . dicyclomine (BENTYL) 10 MG capsule Take 10 mg by mouth 4 (four) times daily -  before meals and at bedtime. As needed for abd pain    . gabapentin (NEURONTIN) 400 MG capsule Take 400 mg by mouth 2 (two) times daily.    . Glucose Blood (ACCU-CHEK AVIVA PLUS VI) by In Vitro route.    . insulin detemir (LEVEMIR) 100 UNIT/ML injection Inject 15 Units into the skin daily.    . iron polysaccharides (NIFEREX) 150 MG  capsule Take 150 mg by mouth daily.    Marland Kitchen lisinopril-hydrochlorothiazide (PRINZIDE,ZESTORETIC) 20-12.5 MG per tablet Take 1 tablet by mouth daily.     . Omega-3 Fatty Acids (FISH OIL) 1000 MG CAPS Take 1 capsule by mouth daily.     Marland Kitchen omeprazole (PRILOSEC) 40 MG capsule Take 40 mg by mouth daily.     Marland Kitchen OZEMPIC, 0.25 OR 0.5 MG/DOSE, 2 MG/1.5ML SOPN Inject into the skin once a week. Inject 0.1875-0.375mLs (0.25-0.5mg  total) subcutaneously    . pantoprazole (PROTONIX) 40 MG tablet Take 40 mg by mouth daily.    Marland Kitchen PARoxetine (PAXIL) 40 MG tablet Take 40 mg by mouth daily.     . pioglitazone (ACTOS) 15 MG tablet Take 15 mg by mouth daily.     . QUEtiapine (SEROQUEL) 100 MG tablet Take 100 mg by mouth at bedtime.    . topiramate (TOPAMAX) 100 MG tablet Take 100 mg by mouth daily.     . vitamin B-12 (CYANOCOBALAMIN) 1000 MCG tablet Take 1,000 mcg by mouth daily.    . metFORMIN (GLUCOPHAGE-XR) 500 MG 24 hr tablet Take 1 tablet (500 mg total) by mouth 2 (two) times a day. (Patient taking differently: Take 1,000 mg by mouth 2 (two) times a day. ) 60 tablet 0   No current facility-administered medications on file prior to visit.     There are no Patient Instructions on file for this visit. No follow-ups on file.   Kris Hartmann, NP  This note was completed with Sales executive.  Any errors are purely unintentional.

## 2019-05-09 ENCOUNTER — Other Ambulatory Visit (INDEPENDENT_AMBULATORY_CARE_PROVIDER_SITE_OTHER): Payer: Self-pay | Admitting: Vascular Surgery

## 2019-05-09 DIAGNOSIS — K551 Chronic vascular disorders of intestine: Secondary | ICD-10-CM

## 2019-05-13 DIAGNOSIS — H2511 Age-related nuclear cataract, right eye: Secondary | ICD-10-CM | POA: Diagnosis not present

## 2019-05-13 DIAGNOSIS — H25811 Combined forms of age-related cataract, right eye: Secondary | ICD-10-CM | POA: Diagnosis not present

## 2019-05-16 DIAGNOSIS — E785 Hyperlipidemia, unspecified: Secondary | ICD-10-CM | POA: Diagnosis not present

## 2019-05-16 DIAGNOSIS — E1142 Type 2 diabetes mellitus with diabetic polyneuropathy: Secondary | ICD-10-CM | POA: Diagnosis not present

## 2019-05-16 DIAGNOSIS — I10 Essential (primary) hypertension: Secondary | ICD-10-CM | POA: Diagnosis not present

## 2019-05-16 DIAGNOSIS — F325 Major depressive disorder, single episode, in full remission: Secondary | ICD-10-CM | POA: Diagnosis not present

## 2019-05-16 DIAGNOSIS — I779 Disorder of arteries and arterioles, unspecified: Secondary | ICD-10-CM | POA: Diagnosis not present

## 2019-05-16 DIAGNOSIS — E1122 Type 2 diabetes mellitus with diabetic chronic kidney disease: Secondary | ICD-10-CM | POA: Diagnosis not present

## 2019-05-16 DIAGNOSIS — E1169 Type 2 diabetes mellitus with other specified complication: Secondary | ICD-10-CM | POA: Diagnosis not present

## 2019-05-16 DIAGNOSIS — N183 Chronic kidney disease, stage 3 (moderate): Secondary | ICD-10-CM | POA: Diagnosis not present

## 2019-05-21 ENCOUNTER — Other Ambulatory Visit: Payer: Self-pay

## 2019-05-21 ENCOUNTER — Other Ambulatory Visit
Admission: RE | Admit: 2019-05-21 | Discharge: 2019-05-21 | Disposition: A | Discharge status: Home / Self Care | Payer: Medicare HMO | Source: Ambulatory Visit | Attending: Vascular Surgery | Admitting: Vascular Surgery

## 2019-05-21 DIAGNOSIS — Z20828 Contact with and (suspected) exposure to other viral communicable diseases: Secondary | ICD-10-CM | POA: Diagnosis not present

## 2019-05-21 DIAGNOSIS — Z01812 Encounter for preprocedural laboratory examination: Secondary | ICD-10-CM | POA: Insufficient documentation

## 2019-05-21 DIAGNOSIS — H2511 Age-related nuclear cataract, right eye: Secondary | ICD-10-CM | POA: Diagnosis not present

## 2019-05-22 ENCOUNTER — Other Ambulatory Visit (INDEPENDENT_AMBULATORY_CARE_PROVIDER_SITE_OTHER): Payer: Self-pay | Admitting: Nurse Practitioner

## 2019-05-22 DIAGNOSIS — F323 Major depressive disorder, single episode, severe with psychotic features: Secondary | ICD-10-CM | POA: Diagnosis not present

## 2019-05-22 LAB — SARS CORONAVIRUS 2 (TAT 6-24 HRS): SARS Coronavirus 2: NEGATIVE

## 2019-05-23 ENCOUNTER — Ambulatory Visit
Admission: RE | Admit: 2019-05-23 | Discharge: 2019-05-23 | Disposition: A | Discharge status: Home / Self Care | Payer: Medicare HMO | Attending: Vascular Surgery | Admitting: Vascular Surgery

## 2019-05-23 ENCOUNTER — Other Ambulatory Visit: Payer: Self-pay

## 2019-05-23 ENCOUNTER — Encounter
Admission: RE | Disposition: A | Discharge status: Home / Self Care | Payer: Self-pay | Source: Home / Self Care | Attending: Vascular Surgery

## 2019-05-23 DIAGNOSIS — E1140 Type 2 diabetes mellitus with diabetic neuropathy, unspecified: Secondary | ICD-10-CM | POA: Diagnosis not present

## 2019-05-23 DIAGNOSIS — Z880 Allergy status to penicillin: Secondary | ICD-10-CM | POA: Insufficient documentation

## 2019-05-23 DIAGNOSIS — K551 Chronic vascular disorders of intestine: Secondary | ICD-10-CM | POA: Insufficient documentation

## 2019-05-23 DIAGNOSIS — E7800 Pure hypercholesterolemia, unspecified: Secondary | ICD-10-CM | POA: Diagnosis not present

## 2019-05-23 DIAGNOSIS — Z8249 Family history of ischemic heart disease and other diseases of the circulatory system: Secondary | ICD-10-CM | POA: Diagnosis not present

## 2019-05-23 DIAGNOSIS — Z888 Allergy status to other drugs, medicaments and biological substances status: Secondary | ICD-10-CM | POA: Diagnosis not present

## 2019-05-23 DIAGNOSIS — Z794 Long term (current) use of insulin: Secondary | ICD-10-CM | POA: Diagnosis not present

## 2019-05-23 DIAGNOSIS — I771 Stricture of artery: Secondary | ICD-10-CM

## 2019-05-23 DIAGNOSIS — F419 Anxiety disorder, unspecified: Secondary | ICD-10-CM | POA: Diagnosis not present

## 2019-05-23 DIAGNOSIS — Z6831 Body mass index (BMI) 31.0-31.9, adult: Secondary | ICD-10-CM | POA: Diagnosis not present

## 2019-05-23 DIAGNOSIS — N183 Chronic kidney disease, stage 3 (moderate): Secondary | ICD-10-CM | POA: Diagnosis not present

## 2019-05-23 DIAGNOSIS — E1122 Type 2 diabetes mellitus with diabetic chronic kidney disease: Secondary | ICD-10-CM | POA: Insufficient documentation

## 2019-05-23 DIAGNOSIS — K219 Gastro-esophageal reflux disease without esophagitis: Secondary | ICD-10-CM | POA: Insufficient documentation

## 2019-05-23 DIAGNOSIS — M1990 Unspecified osteoarthritis, unspecified site: Secondary | ICD-10-CM | POA: Insufficient documentation

## 2019-05-23 DIAGNOSIS — I2510 Atherosclerotic heart disease of native coronary artery without angina pectoris: Secondary | ICD-10-CM | POA: Insufficient documentation

## 2019-05-23 DIAGNOSIS — Z7982 Long term (current) use of aspirin: Secondary | ICD-10-CM | POA: Insufficient documentation

## 2019-05-23 DIAGNOSIS — G4730 Sleep apnea, unspecified: Secondary | ICD-10-CM | POA: Diagnosis not present

## 2019-05-23 DIAGNOSIS — Z79899 Other long term (current) drug therapy: Secondary | ICD-10-CM | POA: Diagnosis not present

## 2019-05-23 DIAGNOSIS — Z833 Family history of diabetes mellitus: Secondary | ICD-10-CM | POA: Insufficient documentation

## 2019-05-23 DIAGNOSIS — E669 Obesity, unspecified: Secondary | ICD-10-CM | POA: Insufficient documentation

## 2019-05-23 DIAGNOSIS — I129 Hypertensive chronic kidney disease with stage 1 through stage 4 chronic kidney disease, or unspecified chronic kidney disease: Secondary | ICD-10-CM | POA: Insufficient documentation

## 2019-05-23 HISTORY — PX: VISCERAL ANGIOGRAPHY: CATH118276

## 2019-05-23 LAB — GLUCOSE, CAPILLARY
Glucose-Capillary: 136 mg/dL — ABNORMAL HIGH (ref 70–99)
Glucose-Capillary: 129 mg/dL — ABNORMAL HIGH (ref 70–99)

## 2019-05-23 LAB — CREATININE, SERUM
Creatinine, Ser: 1.11 mg/dL (ref 0.61–1.24)
GFR calc Af Amer: >60 mL/min (ref >60)
GFR calc non Af Amer: >60 mL/min (ref >60)

## 2019-05-23 LAB — BUN: BUN: 25 mg/dL — ABNORMAL HIGH (ref 8–23)

## 2019-05-23 SURGERY — VISCERAL ANGIOGRAPHY
Anesthesia: Moderate Sedation

## 2019-05-23 MED ORDER — MIDAZOLAM HCL 2 MG/2ML IJ SOLN
INTRAMUSCULAR | Status: DC | PRN
  Administered 2019-05-23: 2 mg via INTRAVENOUS
  Administered 2019-05-23: 1 mg via INTRAVENOUS

## 2019-05-23 MED ORDER — FENTANYL CITRATE (PF) 100 MCG/2ML IJ SOLN
INTRAMUSCULAR | Status: AC
  Filled 2019-05-23: qty 2

## 2019-05-23 MED ORDER — FENTANYL CITRATE (PF) 100 MCG/2ML IJ SOLN
INTRAMUSCULAR | Status: DC | PRN
  Administered 2019-05-23: 50 ug via INTRAVENOUS
  Administered 2019-05-23: 25 ug via INTRAVENOUS

## 2019-05-23 MED ORDER — HEPARIN SODIUM (PORCINE) 1000 UNIT/ML IJ SOLN
INTRAMUSCULAR | Status: AC
  Filled 2019-05-23: qty 1

## 2019-05-23 MED ORDER — MIDAZOLAM HCL 5 MG/5ML IJ SOLN
INTRAMUSCULAR | Status: AC
  Filled 2019-05-23: qty 5

## 2019-05-23 MED ORDER — MIDAZOLAM HCL 2 MG/ML PO SYRP: 8.0000 mg | ORAL_SOLUTION | Freq: Once | ORAL | Status: DC | PRN

## 2019-05-23 MED ORDER — HEPARIN SODIUM (PORCINE) 1000 UNIT/ML IJ SOLN
INTRAMUSCULAR | Status: DC | PRN
  Administered 2019-05-23: 5000 [IU] via INTRAVENOUS

## 2019-05-23 MED ORDER — SODIUM CHLORIDE 0.9 % IV SOLN
INTRAVENOUS | Status: DC
  Administered 2019-05-23: 08:00:00 via INTRAVENOUS

## 2019-05-23 MED ORDER — CLINDAMYCIN PHOSPHATE 300 MG/50ML IV SOLN
300.0000 mg | Freq: Once | INTRAVENOUS | Status: AC
  Administered 2019-05-23: 09:00:00 300 mg via INTRAVENOUS

## 2019-05-23 MED ORDER — DIPHENHYDRAMINE HCL 50 MG/ML IJ SOLN: 50.0000 mg | Freq: Once | INTRAMUSCULAR | Status: DC | PRN

## 2019-05-23 MED ORDER — ONDANSETRON HCL 4 MG/2ML IJ SOLN: 4.0000 mg | Freq: Four times a day (QID) | INTRAMUSCULAR | Status: DC | PRN

## 2019-05-23 MED ORDER — HYDROMORPHONE HCL 1 MG/ML IJ SOLN: 1.0000 mg | Freq: Once | INTRAMUSCULAR | Status: DC | PRN

## 2019-05-23 MED ORDER — CLINDAMYCIN PHOSPHATE 300 MG/50ML IV SOLN
INTRAVENOUS | Status: AC
  Administered 2019-05-23: 300 mg via INTRAVENOUS
  Filled 2019-05-23: qty 50

## 2019-05-23 MED ORDER — CLOPIDOGREL BISULFATE 75 MG PO TABS: 75.0000 mg | ORAL_TABLET | Freq: Every day | ORAL | 11 refills | Status: DC

## 2019-05-23 MED ORDER — METHYLPREDNISOLONE SODIUM SUCC 125 MG IJ SOLR: 125.0000 mg | Freq: Once | INTRAMUSCULAR | Status: DC | PRN

## 2019-05-23 MED ORDER — FAMOTIDINE 20 MG PO TABS: 40.0000 mg | ORAL_TABLET | Freq: Once | ORAL | Status: DC | PRN

## 2019-05-23 SURGICAL SUPPLY — 14 items
CATH BEACON 5 .035 65 C2 TIP (CATHETERS) ×2 IMPLANT
CATH PIG 70CM (CATHETERS) ×3 IMPLANT
DEVICE PRESTO INFLATION (MISCELLANEOUS) ×3 IMPLANT
DEVICE STARCLOSE SE CLOSURE (Vascular Products) ×2 IMPLANT
DEVICE TORQUE .025-.038 (MISCELLANEOUS) ×2 IMPLANT
GLIDEWIRE STIFF .35X180X3 HYDR (WIRE) ×2 IMPLANT
PACK ANGIOGRAPHY (CUSTOM PROCEDURE TRAY) ×3 IMPLANT
SHEATH BRITE TIP 5FRX11 (SHEATH) ×3 IMPLANT
SHEATH FLEXOR ANSEL2 7FRX45 (SHEATH) ×2 IMPLANT
STENT LIFESTREAM 8X26X80 (Permanent Stent) ×2 IMPLANT
SYR MEDRAD MARK 7 150ML (SYRINGE) ×2 IMPLANT
TUBING CONTRAST HIGH PRESS 72 (TUBING) ×3 IMPLANT
WIRE J 3MM .035X145CM (WIRE) ×2 IMPLANT
WIRE MAGIC TOR.035 180C (WIRE) ×2 IMPLANT

## 2019-05-23 NOTE — Op Note (Signed)
[facility] Percutaneous Study/Intervention Procedural Note   Date of Surgery: 05/23/2019  Surgeon(s): [name]   Assistants:None  Pre-operative Diagnosis: SMA stenosis, chronic mesenteric ischemic symptoms Post-operative diagnosis: Same  Procedure(s) Performed: 1. Ultrasound guidance for vascular access right femoral artery 2. Catheter placement into SMA from right femoral approach 3. Aortogram and selective SMA angiogram 4. Balloon expandable stent placement to the superior mesenteric artery using a 8 mm diameter x 26 mm length balloon expandable Lifestream stent 5. StarClose closure device right femoral artery  Anesthesia: local with moderate conscious sedation for approximately 20 minutes using 3 mg of Versed and 75 Mcg of Fentanyl  Fluoro Time: 2.8 minutes  Contrast: 30 cc  Indications: Patient is a 73 year old male who presented to the office with chronic abdominal pain and concerning symptoms for chronic mesenteric ischemia.  He is referred by his gastroenterologist.  Mesenteric duplex showed elevated velocities in the SMA and we discussed options for treatment.  Angiogram is performed to evaluate the lesion more thoroughly and potentially allow treatment. Risks and benefits were discussed and informed consent was obtained  Procedure: The patient was identified and appropriate procedural time out was performed. The patient was then placed supine on the table and prepped and draped in the usual sterile fashion.Moderate conscious sedation was administered during a face to face encounter with the patient with the RN monitoring their vital signs, mental status, telemetry and pulse oximetry throughout the procedure.  Ultrasound was used to evaluate the right common femoral artery. It was patent . A digital ultrasound image was acquired. A Seldinger needle was used to access  the right common femoral artery under direct ultrasound guidance and a permanent image was performed. A 0.035 J wire was advanced without resistance and a 5Fr sheath was placed. Pigtail catheter was then placed into the aorta and initially an AP aortogram was performed which showed moderate calcification of the aorta with reasonably normal renal arteries.  The visceral vessels could be seen in terms of their flow but the origins were not well seen in the AP projection. Lateral projection view was performed which showed typical orientations of the celiac and SMA.  There appeared to be good flow in the celiac artery without an obvious high-grade stenosis but the SMA appeared to have a stenosis just beyond its origin in the 70% range.  I then selective cannulated the SMA with a C2 catheter. Selective imaging of the superior mesenteric artery demonstrated a 70% stenosis approximately half a centimeter beyond the origin of the SMA. The patient was then given 5000 units of intravenous heparin and I exchanged for a 7 French sheath and a Magic torque wire to perform treatment. I was able to navigate across the lesion without difficulty with a Glidewire and then exchanged for the Magic torque wire. I then selected a 8 mm diameter by 26 mm length balloon expandable Lifestream stent and deployed this across the stenosis at the origin of the superior mesenteric artery just back into the aorta in excellent location. The balloon was inflated to 12 atm.  Excellent flow through the SMA with less than 10% residual stenosis after stent placement was seen on completion angiogram and I elected to terminate the procedure. The sheath was pulled back to the ipsilateral external iliac artery and oblique arteriogram was performed of the right femoral artery. StarClose closure device was deployed in the usual fashion with excellent hemostatic result.  Findings:  Aortogram/SMA:Moderate calcification of the aorta with good  flow  and no high-grade stenosis in the renal arteries.  The celiac and SMA had good perfusion distally in the AP projection but were not well seen in the AP projection at their origin.  In the lateral projection the SMA had an approximately 70% stenosis in the celiac had no obvious stenosis.  SMA stenosis was confirmed with selective imaging as well.      Disposition: Patient was taken to the recovery room in stable condition having tolerated the procedure well.   [name] 05/23/2019 9:15 AM   This note was created with Dragon Medical transcription system. Any errors in dictation are purely unintentional.

## 2019-05-23 NOTE — H&P (Signed)
[facility] History & Physical Update  The patient was interviewed and re-examined.  The patient's previous History and Physical has been reviewed and is unchanged.  There is no change in the plan of care. We plan to proceed with the scheduled procedure.  [name], MD  05/23/2019, 8:29 AM

## 2019-05-24 ENCOUNTER — Encounter: Payer: Self-pay | Admitting: Vascular Surgery

## 2019-06-03 ENCOUNTER — Other Ambulatory Visit (INDEPENDENT_AMBULATORY_CARE_PROVIDER_SITE_OTHER): Payer: Self-pay | Admitting: Vascular Surgery

## 2019-06-03 ENCOUNTER — Telehealth (INDEPENDENT_AMBULATORY_CARE_PROVIDER_SITE_OTHER): Payer: Self-pay | Admitting: Vascular Surgery

## 2019-06-03 DIAGNOSIS — Z95820 Peripheral vascular angioplasty status with implants and grafts: Secondary | ICD-10-CM

## 2019-06-03 NOTE — Telephone Encounter (Signed)
Patient states that the pain has been constant but has recently gotten worse.  Patient is scheduled to come in tomorrow 06/04/19 at [facility] for [place] and to see Arna Medici. AS, CMA

## 2019-06-03 NOTE — Telephone Encounter (Signed)
Is this new pain or has this pain been there since the procedure? Abdominal pain after a visceral angiogram is common, due to restoration of blood flow and the swelling of intestines that sometimes occurs. Sometimes it can take a couple of weeks to resolve, but ibuprofen or tylenol are helpful.  What would be abnormal is if he had a tolerable pain level but then suddenly it became unbearable.  We can bring him in with a mesenteric duplex, me or Dew.

## 2019-06-03 NOTE — Telephone Encounter (Signed)
Patient had Visceral angio on 05/23/19. Patient calling today stating that he is having abdominal pain. 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 Requesting to be seen. Please advise. AS, CMA

## 2019-06-04 ENCOUNTER — Other Ambulatory Visit: Payer: Self-pay

## 2019-06-04 ENCOUNTER — Ambulatory Visit (INDEPENDENT_AMBULATORY_CARE_PROVIDER_SITE_OTHER): Payer: Medicare HMO

## 2019-06-04 ENCOUNTER — Ambulatory Visit (INDEPENDENT_AMBULATORY_CARE_PROVIDER_SITE_OTHER): Payer: Medicare HMO | Admitting: Nurse Practitioner

## 2019-06-04 ENCOUNTER — Encounter (INDEPENDENT_AMBULATORY_CARE_PROVIDER_SITE_OTHER): Payer: Self-pay | Admitting: Nurse Practitioner

## 2019-06-04 ENCOUNTER — Encounter (INDEPENDENT_AMBULATORY_CARE_PROVIDER_SITE_OTHER): Payer: Self-pay

## 2019-06-04 VITALS — BP 145/67 | HR 69 | Resp 16 | Ht 65.0 in | Wt 187.0 lb

## 2019-06-04 DIAGNOSIS — K219 Gastro-esophageal reflux disease without esophagitis: Secondary | ICD-10-CM

## 2019-06-04 DIAGNOSIS — K551 Chronic vascular disorders of intestine: Secondary | ICD-10-CM | POA: Diagnosis not present

## 2019-06-04 DIAGNOSIS — I10 Essential (primary) hypertension: Secondary | ICD-10-CM | POA: Diagnosis not present

## 2019-06-04 DIAGNOSIS — Z95820 Peripheral vascular angioplasty status with implants and grafts: Secondary | ICD-10-CM

## 2019-06-04 MED ORDER — TRAMADOL HCL 50 MG PO TABS: 50.0000 mg | ORAL_TABLET | Freq: Four times a day (QID) | ORAL | 0 refills | Status: DC | PRN

## 2019-06-04 NOTE — Progress Notes (Signed)
SUBJECTIVE:  Patient ID: Patrick Turner, male    DOB: 02-07-1946, 73 y.o.   MRN: [id number] Chief Complaint  Patient presents with  . Follow-up    ultrasound follow up    HPI  Patrick Turner is a 73 y.o. male presents today due to increased abdominal pain following mesenteric angiogram on 05/14/2019.  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  The patient states that generally the pain is tolerable until after he eats.  The patient does endorse eating large full meals.  He denies any nausea or vomiting following these meals he denies any fever, chills, nausea, vomiting or diarrhea.  He denies any chest pain or shortness of breath.  He denies any TIA-like symptoms.  Today we performed a mesenteric duplex which revealed normal celiac artery, inferior mesenteric artery, splenic artery and hepatic artery findings.  There was some moderate atherosclerosis throughout the abdominal aorta.  The SMA appears widely patent throughout post PTA and stent placement.  The velocities are increased right at the region of the SMA but this is most likely due to aortic atherosclerosis.  Flow is improved from the previous study.  Past Medical History:  Diagnosis Date  . Altered mental status   . Anemia   . Anxiety   . Aortic atherosclerosis ([place])   . Arthritis   . Coronary artery disease   . CRD (chronic renal disease)   . Depression   . Diabetes mellitus without complication ([place])   . Encephalopathy acute   . GERD (gastroesophageal reflux disease)   . Headache   . Hypercholesteremia   . Hypertension   . Neuropathy   . Severe obesity ([place])   . Sleep apnea   . Sleep terror    per patient this year per patient     Past Surgical History:  Procedure Laterality Date  . APPENDECTOMY    . CIRCUMCISION    . COLONOSCOPY    . COLONOSCOPY WITH PROPOFOL N/A 07/10/2018   Procedure:  COLONOSCOPY WITH PROPOFOL;  Surgeon: Lollie Sails, MD;  Location[facility] ENDOSCOPY;  Service: Endoscopy;  Laterality: N/A;  . COLONOSCOPY WITH PROPOFOL N/A 04/23/2019   Procedure: COLONOSCOPY WITH PROPOFOL;  Surgeon: Lollie Sails, MD;  Location: [facility] ENDOSCOPY;  Service: Endoscopy;  Laterality: N/A;  . ESOPHAGOGASTRODUODENOSCOPY    . ESOPHAGOGASTRODUODENOSCOPY (EGD) WITH PROPOFOL N/A 04/23/2019   Procedure: ESOPHAGOGASTRODUODENOSCOPY (EGD) WITH PROPOFOL;  Surgeon: Lollie Sails, MD;  Location: [facility] ENDOSCOPY;  Service: Endoscopy;  Laterality: N/A;  . JOINT REPLACEMENT    . 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 KNEE ARTHROSCOPY Right   . TONSILLECTOMY    . VISCERAL ANGIOGRAPHY N/A 05/23/2019   Procedure: VISCERAL ANGIOGRAPHY;  Surgeon: Algernon Huxley, MD;  Location: [facility] LAB;  Service: Cardiovascular;  Laterality: N/A;    Social History   Socioeconomic History  . Marital status: Married    Spouse name: [name]  . Number of children: 2  . Years of education: Not on file  . Highest education level: Associate degree: academic program  Occupational History  . Occupation: works at [employer]  . Financial resource strain: Not hard at all  . Food insecurity    Worry: Never true  Inability: Never true  . Transportation needs    Medical: No    Non-medical: No  Tobacco Use  . Smoking status: Never Smoker  . Smokeless tobacco: Never Used  Substance and Sexual Activity  . Alcohol use: Yes    Comment: rare  . Drug use: No  . Sexual activity: Not on file  Lifestyle  . Physical activity    Days per week: 0 days    Minutes per session: Not on file  . Stress: Only a little  Relationships  . Social Herbalist on phone: More than three times a week    Gets together: Not on file    Attends  religious service: 1 to 4 times per year    Active member of club or organization: Not on file    Attends meetings of clubs or organizations: Not on file    Relationship status: Married  . Intimate partner violence    Fear of current or ex partner: No    Emotionally abused: No    Physically abused: No    Forced sexual activity: No  Other Topics Concern  . Not on file  Social History Narrative  . Not on file    Family History  Problem Relation Age of Onset  . Diabetes Mother   . Heart disease Mother   . Cancer Father   . COPD Father     Allergies  Allergen Reactions  . Penicillins Anaphylaxis    Has patient had a PCN reaction causing immediate rash, facial/tongue/throat swelling, SOB or lightheadedness with hypotension: Yes Has patient had a PCN reaction causing severe rash involving mucus membranes or skin necrosis: No Has patient had a PCN reaction that required hospitalization Yes Has patient had a PCN reaction occurring within the last 10 years: No If all of the above answers are "NO", then may proceed with Cephalosporin use.  . Tavist [Clemastine] Swelling  . Loratadine      Review of Systems   Review of Systems: Negative Unless Checked Constitutional: [] Weight loss  [] Fever  [] Chills Cardiac: [] Chest pain   []  Atrial Fibrillation  [] Palpitations   [] Shortness of breath when laying flat   [] Shortness of breath with exertion. [] Shortness of breath at rest Vascular:  [] Pain in legs with walking   [] Pain in legs with standing [] Pain in legs when laying flat   [] Claudication    [] Pain in feet when laying flat    [] History of DVT   [] Phlebitis   [] Swelling in legs   [] Varicose veins   [] Non-healing ulcers Pulmonary:   [] Uses home oxygen   [] Productive cough   [] Hemoptysis   [] Wheeze  [] COPD   [] Asthma Neurologic:  [] Dizziness   [] Seizures  [] Blackouts [] History of stroke   [] History of TIA  [] Aphasia   [] Temporary Blindness   [] Weakness or numbness in arm   [] Weakness or  numbness in leg Musculoskeletal:   [] Joint swelling   [] Joint pain   [] Low back pain  []  History of Knee Replacement [] Arthritis [] back Surgeries  []  Spinal Stenosis    Hematologic:  [] Easy bruising  [] Easy bleeding   [] Hypercoagulable state   [] Anemic Gastrointestinal:  [] Diarrhea   [] Vomiting  [x] Gastroesophageal reflux/heartburn   [] Difficulty swallowing. [x] Abdominal pain Genitourinary:  [] Chronic kidney disease   [] Difficult urination  [] Anuric   [] Blood in urine [] Frequent urination  [] Burning with urination   [] Hematuria Skin:  [] Rashes   [] Ulcers [] Wounds Psychological:  [] History of anxiety   []  History of major  depression  []  Memory Difficulties      OBJECTIVE:   Physical Exam  BP (!) 145/67 (BP Location: Right Arm)   Pulse 69   Resp 16   Ht 5\' 5"  (1.651 m)   Wt 187 lb (84.8 kg)   BMI 31.12 kg/m   Gen: WD/WN, NAD Head: [place]/AT, No temporalis wasting.  Ear/Nose/Throat: Hearing grossly intact, nares w/o erythema or drainage Eyes: PER, EOMI, sclera nonicteric.  Neck: Supple, no masses.  No JVD.  Pulmonary:  Good air movement, no use of accessory muscles.  Cardiac: RRR Vascular:  Vessel Right Left  Radial Palpable Palpable   Gastrointestinal: soft, non-distended. No guarding/no peritoneal signs.  Musculoskeletal: M/S 5/5 throughout.  No deformity or atrophy.  Neurologic: Pain and light touch intact in extremities.  Symmetrical.  Speech is fluent. Motor exam as listed above. Psychiatric: Judgment intact, Mood & affect appropriate for pt's clinical situation. Dermatologic: No Venous rashes. No Ulcers Noted.  No changes consistent with cellulitis. Lymph : No Cervical lymphadenopathy, no lichenification or skin changes of chronic lymphedema.       ASSESSMENT AND PLAN:  1. 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  Advised the patient to try to eat more frequent but smaller meals to  see if this assist with the pain.  I had a discussion about the common post procedural course following a mesenteric angiogram.  The patient will return in 3 to 4 weeks in order for [place] to assess the patient's pain level to see if it has lessened over time.  The patient continues to have pain and there continues to be good blood flow within the mesenteric artery, we will consider consulting GI. - traMADol (ULTRAM) 50 MG tablet; Take 1 tablet (50 mg total) by mouth every 6 (six) hours as needed. Take 1-2 tablet (100 mg total) by mouth every 6 (six) hours as needed  Dispense: 40 tablet; Refill: 0  2. Gastroesophageal reflux disease without esophagitis Continue PPI as already ordered, this medication has been reviewed and there are no changes at this time.  Avoidence of caffeine and alcohol  Moderate elevation of the head of the bed   3. HTN (hypertension), benign Continue antihypertensive medications as already ordered, these medications have been reviewed and there are no changes at this time.    Current Outpatient Medications on File Prior to Visit  Medication Sig Dispense Refill  . aspirin 81 MG tablet Take 1 tablet (81 mg total) by mouth daily.    Marland Kitchen atorvastatin (LIPITOR) 80 MG tablet Take 80 mg by mouth at bedtime.    Marland Kitchen b complex vitamins capsule Take 1 capsule by mouth daily.    . clonazePAM (KLONOPIN) 1 MG tablet Take 0.5 mg by mouth at bedtime as needed for anxiety.   2  . clopidogrel (PLAVIX) 75 MG tablet Take 1 tablet (75 mg total) by mouth daily. 30 tablet 11  . dicyclomine (BENTYL) 10 MG capsule Take 10 mg by mouth 4 (four) times daily -  before meals and at bedtime. As needed for abd pain    . gabapentin (NEURONTIN) 400 MG capsule Take 400 mg by mouth 2 (two) times daily.    . Glucose Blood (ACCU-CHEK AVIVA PLUS VI) by In Vitro route.    . insulin detemir (LEVEMIR) 100 UNIT/ML injection Inject 15 Units into the skin daily.    . iron polysaccharides (NIFEREX) 150 MG capsule  Take  150 mg by mouth daily.    Marland Kitchen lisinopril-hydrochlorothiazide (PRINZIDE,ZESTORETIC) 20-12.5 MG per tablet Take 1 tablet by mouth daily.     . Omega-3 Fatty Acids (FISH OIL) 1000 MG CAPS Take 1 capsule by mouth daily.     Marland Kitchen omeprazole (PRILOSEC) 40 MG capsule Take 40 mg by mouth daily.     Marland Kitchen OZEMPIC, 0.25 OR 0.5 MG/DOSE, 2 MG/1.5ML SOPN Inject into the skin once a week. Inject 0.1875-0.375mLs (0.25-0.5mg  total) subcutaneously    . pantoprazole (PROTONIX) 40 MG tablet Take 40 mg by mouth daily.    Marland Kitchen PARoxetine (PAXIL) 40 MG tablet Take 40 mg by mouth daily.     . pioglitazone (ACTOS) 15 MG tablet Take 15 mg by mouth daily.     . QUEtiapine (SEROQUEL) 100 MG tablet Take 100 mg by mouth at bedtime.    . topiramate (TOPAMAX) 100 MG tablet Take 100 mg by mouth daily.     . vitamin B-12 (CYANOCOBALAMIN) 1000 MCG tablet Take 1,000 mcg by mouth daily.    . metFORMIN (GLUCOPHAGE-XR) 500 MG 24 hr tablet Take 1 tablet (500 mg total) by mouth 2 (two) times a day. (Patient taking differently: Take 1,000 mg by mouth 2 (two) times a day. ) 60 tablet 0   No current facility-administered medications on file prior to visit.     There are no Patient Instructions on file for this visit. No follow-ups on file.   Kris Hartmann, NP  This note was completed with Sales executive.  Any errors are purely unintentional.

## 2019-06-12 ENCOUNTER — Other Ambulatory Visit (INDEPENDENT_AMBULATORY_CARE_PROVIDER_SITE_OTHER): Payer: Self-pay | Admitting: Nurse Practitioner

## 2019-06-12 DIAGNOSIS — N183 Chronic kidney disease, stage 3 (moderate): Secondary | ICD-10-CM | POA: Diagnosis not present

## 2019-06-12 DIAGNOSIS — E1142 Type 2 diabetes mellitus with diabetic polyneuropathy: Secondary | ICD-10-CM | POA: Diagnosis not present

## 2019-06-12 DIAGNOSIS — Z794 Long term (current) use of insulin: Secondary | ICD-10-CM | POA: Diagnosis not present

## 2019-06-12 DIAGNOSIS — E1159 Type 2 diabetes mellitus with other circulatory complications: Secondary | ICD-10-CM | POA: Diagnosis not present

## 2019-06-12 DIAGNOSIS — E1122 Type 2 diabetes mellitus with diabetic chronic kidney disease: Secondary | ICD-10-CM | POA: Diagnosis not present

## 2019-06-12 DIAGNOSIS — E1165 Type 2 diabetes mellitus with hyperglycemia: Secondary | ICD-10-CM | POA: Diagnosis not present

## 2019-06-13 DIAGNOSIS — E113412 Type 2 diabetes mellitus with severe nonproliferative diabetic retinopathy with macular edema, left eye: Secondary | ICD-10-CM | POA: Diagnosis not present

## 2019-06-13 DIAGNOSIS — E113413 Type 2 diabetes mellitus with severe nonproliferative diabetic retinopathy with macular edema, bilateral: Secondary | ICD-10-CM | POA: Diagnosis not present

## 2019-06-13 DIAGNOSIS — E113411 Type 2 diabetes mellitus with severe nonproliferative diabetic retinopathy with macular edema, right eye: Secondary | ICD-10-CM | POA: Diagnosis not present

## 2019-06-25 ENCOUNTER — Ambulatory Visit (INDEPENDENT_AMBULATORY_CARE_PROVIDER_SITE_OTHER): Payer: Medicare HMO | Admitting: Vascular Surgery

## 2019-06-25 ENCOUNTER — Encounter (INDEPENDENT_AMBULATORY_CARE_PROVIDER_SITE_OTHER): Payer: Medicare HMO

## 2019-06-25 DIAGNOSIS — Z96651 Presence of right artificial knee joint: Secondary | ICD-10-CM | POA: Diagnosis not present

## 2019-06-25 DIAGNOSIS — M1711 Unilateral primary osteoarthritis, right knee: Secondary | ICD-10-CM | POA: Diagnosis not present

## 2019-07-02 ENCOUNTER — Ambulatory Visit (INDEPENDENT_AMBULATORY_CARE_PROVIDER_SITE_OTHER): Payer: Medicare HMO | Admitting: Vascular Surgery

## 2019-07-02 ENCOUNTER — Other Ambulatory Visit: Payer: Self-pay

## 2019-07-02 ENCOUNTER — Encounter (INDEPENDENT_AMBULATORY_CARE_PROVIDER_SITE_OTHER): Payer: Self-pay | Admitting: Vascular Surgery

## 2019-07-02 VITALS — BP 138/61 | HR 67 | Resp 12 | Ht 65.0 in | Wt 193.0 lb

## 2019-07-02 DIAGNOSIS — E1142 Type 2 diabetes mellitus with diabetic polyneuropathy: Secondary | ICD-10-CM

## 2019-07-02 DIAGNOSIS — H2511 Age-related nuclear cataract, right eye: Secondary | ICD-10-CM | POA: Diagnosis not present

## 2019-07-02 DIAGNOSIS — K551 Chronic vascular disorders of intestine: Secondary | ICD-10-CM | POA: Diagnosis not present

## 2019-07-02 DIAGNOSIS — H524 Presbyopia: Secondary | ICD-10-CM | POA: Diagnosis not present

## 2019-07-02 DIAGNOSIS — R1084 Generalized abdominal pain: Secondary | ICD-10-CM

## 2019-07-02 DIAGNOSIS — I10 Essential (primary) hypertension: Secondary | ICD-10-CM | POA: Diagnosis not present

## 2019-07-02 DIAGNOSIS — H2512 Age-related nuclear cataract, left eye: Secondary | ICD-10-CM | POA: Diagnosis not present

## 2019-07-02 DIAGNOSIS — H52223 Regular astigmatism, bilateral: Secondary | ICD-10-CM | POA: Diagnosis not present

## 2019-07-02 NOTE — Assessment & Plan Note (Signed)
blood glucose control important in reducing the progression of atherosclerotic disease. Also, involved in wound healing. On appropriate medications.  

## 2019-07-02 NOTE — Assessment & Plan Note (Signed)
The patient has had about a 6 pound weight gain since the procedure and is eating much better.  Duplex from a couple of weeks ago showed improvement in the velocities in the SMA although there is still slightly elevated velocities at the origin.  At this point, we will plan to continue the current medical regimen and recheck a duplex in 6 months unless he develops a problem in the interim.

## 2019-07-02 NOTE — Assessment & Plan Note (Signed)
blood pressure control important in reducing the progression of atherosclerotic disease. On appropriate oral medications.  

## 2019-07-02 NOTE — Progress Notes (Signed)
MRN : [id number]  Patrick Turner is a 73 y.o. (1946/01/19) male who presents with chief complaint of  Chief Complaint  Patient presents with   Follow-up  .  History of Present Illness: Patient returns today in follow up of his chronic visceral ischemia.  About 6 weeks ago he underwent an SMA stent placement.  He had some difficulties immediately after the procedure with pain and slow recovery, but after a week or so he has been eating much better.  He has gained 6 pounds since his last visit about a month ago.  He is not currently having postprandial abdominal pain.  His access site is well-healed.  He is doing fairly well. Duplex from a couple of weeks ago showed improvement in the velocities in the SMA although there is still slightly elevated velocities at the origin.  Current Outpatient Medications  Medication Sig Dispense Refill   aspirin 81 MG tablet Take 1 tablet (81 mg total) by mouth daily.     atorvastatin (LIPITOR) 80 MG tablet Take 80 mg by mouth at bedtime.     b complex vitamins capsule Take 1 capsule by mouth daily.     clonazePAM (KLONOPIN) 1 MG tablet Take 0.5 mg by mouth at bedtime as needed for anxiety.   2   clopidogrel (PLAVIX) 75 MG tablet Take 1 tablet (75 mg total) by mouth daily. 30 tablet 11   dicyclomine (BENTYL) 10 MG capsule Take 10 mg by mouth 4 (four) times daily -  before meals and at bedtime. As needed for abd pain     gabapentin (NEURONTIN) 400 MG capsule Take 400 mg by mouth 2 (two) times daily.     Glucose Blood (ACCU-CHEK AVIVA PLUS VI) by In Vitro route.     insulin detemir (LEVEMIR) 100 UNIT/ML injection Inject 15 Units into the skin daily.     iron polysaccharides (NIFEREX) 150 MG capsule Take 150 mg by mouth daily.     lisinopril-hydrochlorothiazide (PRINZIDE,ZESTORETIC) 20-12.5 MG per tablet Take 1 tablet by mouth daily.      metFORMIN (GLUCOPHAGE-XR) 500 MG 24 hr tablet Take 1 tablet (500 mg total) by mouth 2 (two) times a day.  (Patient taking differently: Take 1,000 mg by mouth 2 (two) times a day. ) 60 tablet 0   Omega-3 Fatty Acids (FISH OIL) 1000 MG CAPS Take 1 capsule by mouth daily.      omeprazole (PRILOSEC) 40 MG capsule Take 40 mg by mouth daily.      OZEMPIC, 0.25 OR 0.5 MG/DOSE, 2 MG/1.5ML SOPN Inject into the skin once a week. Inject 0.1875-0.375mLs (0.25-0.5mg  total) subcutaneously     pantoprazole (PROTONIX) 40 MG tablet Take 40 mg by mouth daily.     PARoxetine (PAXIL) 40 MG tablet Take 40 mg by mouth daily.      pioglitazone (ACTOS) 15 MG tablet Take 15 mg by mouth daily.      QUEtiapine (SEROQUEL) 100 MG tablet Take 100 mg by mouth at bedtime.     topiramate (TOPAMAX) 100 MG tablet Take 100 mg by mouth daily.      vitamin B-12 (CYANOCOBALAMIN) 1000 MCG tablet Take 1,000 mcg by mouth daily.     No current facility-administered medications for this visit.     Past Medical History:  Diagnosis Date   Altered mental status    Anemia    Anxiety    Aortic atherosclerosis (HCC)    Arthritis    Coronary artery disease    CRD (  chronic renal disease)    Depression    Diabetes mellitus without complication (HCC)    Encephalopathy acute    GERD (gastroesophageal reflux disease)    Headache    Hypercholesteremia    Hypertension    Neuropathy    Severe obesity (HCC)    Sleep apnea    Sleep terror    per patient this year per patient     Past Surgical History:  Procedure Laterality Date   APPENDECTOMY     CIRCUMCISION     COLONOSCOPY     COLONOSCOPY WITH PROPOFOL N/A 07/10/2018   Procedure: COLONOSCOPY WITH PROPOFOL;  Surgeon: Lollie Sails, MD;  Location: [facility] ENDOSCOPY;  Service: Endoscopy;  Laterality: N/A;   COLONOSCOPY WITH PROPOFOL N/A 04/23/2019   Procedure: COLONOSCOPY WITH PROPOFOL;  Surgeon: Lollie Sails, MD;  Location: [facility] ENDOSCOPY;  Service: Endoscopy;  Laterality: N/A;   ESOPHAGOGASTRODUODENOSCOPY     ESOPHAGOGASTRODUODENOSCOPY (EGD)  WITH PROPOFOL N/A 04/23/2019   Procedure: ESOPHAGOGASTRODUODENOSCOPY (EGD) WITH PROPOFOL;  Surgeon: Lollie Sails, MD;  Location: [facility] ENDOSCOPY;  Service: Endoscopy;  Laterality: N/A;   JOINT REPLACEMENT     KNEE ARTHROPLASTY Right 06/27/2016   Procedure: COMPUTER ASSISTED TOTAL KNEE ARTHROPLASTY;  Surgeon: Dereck Leep, MD;  Location: [facility] ORS;  Service: Orthopedics;  Laterality: Right;   KNEE ARTHROSCOPY Right    TONSILLECTOMY     VISCERAL ANGIOGRAPHY N/A 05/23/2019   Procedure: VISCERAL ANGIOGRAPHY;  Surgeon: Algernon Huxley, MD;  Location: [facility];  Service: Cardiovascular;  Laterality: N/A;    Social History Social History   Tobacco Use   Smoking status: Never Smoker   Smokeless tobacco: Never Used  Substance Use Topics   Alcohol use: Yes    Comment: rare   Drug use: No    Family History Family History  Problem Relation Age of Onset   Diabetes Mother    Heart disease Mother    Cancer Father    COPD Father     Allergies  Allergen Reactions   Penicillins Anaphylaxis    Has patient had a PCN reaction causing immediate rash, facial/tongue/throat swelling, SOB or lightheadedness with hypotension: Yes Has patient had a PCN reaction causing severe rash involving mucus membranes or skin necrosis: No Has patient had a PCN reaction that required hospitalization Yes Has patient had a PCN reaction occurring within the last 10 years: No If all of the above answers are "NO", then may proceed with Cephalosporin use.   Tavist [Clemastine] Swelling   Loratadine     Patient states he is not aware of this allergy and request removal.      REVIEW OF SYSTEMS (Negative unless checked)  Constitutional: [x] Weight loss  [] Fever  [] Chills Cardiac: [] Chest pain   [] Chest pressure   [] Palpitations   [] Shortness of breath when laying flat   [] Shortness of breath at rest   [] Shortness of breath with exertion. Vascular:  [] Pain in legs with walking   [] Pain in  legs at rest   [] Pain in legs when laying flat   [] Claudication   [] Pain in feet when walking  [] Pain in feet at rest  [] Pain in feet when laying flat   [] History of DVT   [] Phlebitis   [] Swelling in legs   [] Varicose veins   [] Non-healing ulcers Pulmonary:   [] Uses home oxygen   [] Productive cough   [] Hemoptysis   [] Wheeze  [] COPD   [] Asthma Neurologic:  [] Dizziness  [] Blackouts   [] Seizures   [] History of  stroke   [] History of TIA  [] Aphasia   [] Temporary blindness   [] Dysphagia   [] Weakness or numbness in arms   [] Weakness or numbness in legs Musculoskeletal:  [x] Arthritis   [] Joint swelling   [x] Joint pain   [] Low back pain Hematologic:  [] Easy bruising  [] Easy bleeding   [] Hypercoagulable state   [] Anemic   Gastrointestinal:  [] Blood in stool   [] Vomiting blood  [x] Gastroesophageal reflux/heartburn   [x] Abdominal pain Genitourinary:  [] Chronic kidney disease   [] Difficult urination  [] Frequent urination  [] Burning with urination   [] Hematuria Skin:  [] Rashes   [] Ulcers   [] Wounds Psychological:  [] History of anxiety   []  History of major depression.  Physical Examination  BP 138/61 (BP Location: Left Arm, Patient Position: Sitting, Cuff Size: Normal)    Pulse 67    Resp 12    Ht 5\' 5"  (1.651 m)    Wt 193 lb (87.5 kg)    BMI 32.12 kg/m  Gen:  WD/WN, NAD Head: [place]/AT, No temporalis wasting. Ear/Nose/Throat: Hearing grossly intact, nares w/o erythema or drainage Eyes: Conjunctiva clear. Sclera non-icteric Neck: Supple.  Trachea midline Pulmonary:  Good air movement, no use of accessory muscles.  Cardiac: RRR, no JVD Vascular:  Vessel Right Left  Radial Palpable Palpable                                   Gastrointestinal: soft, non-tender/non-distended. No guarding/reflex.  Musculoskeletal: M/S 5/5 throughout.  No deformity or atrophy. 1+ edema. Neurologic: Sensation grossly intact in extremities.  Symmetrical.  Speech is fluent.  Psychiatric: Judgment intact, Mood & affect  appropriate for pt's clinical situation. Dermatologic: No rashes or ulcers noted.  No cellulitis or open wounds.       Labs Recent Results (from the past 2160 hour(s))  SARS CORONAVIRUS 2 Nasal Swab Aptima Multi Swab     Status: None   Collection Time: 04/19/19  1:22 PM   Specimen: Aptima Multi Swab; Nasal Swab  Result Value Ref Range   SARS Coronavirus 2 NEGATIVE NEGATIVE    Comment: (NOTE) SARS-CoV-2 target nucleic acids are NOT DETECTED. The SARS-CoV-2 RNA is generally detectable in upper and lower respiratory specimens during the acute phase of infection. Negative results do not preclude SARS-CoV-2 infection, do not rule out co-infections with other pathogens, and should not be used as the sole basis for treatment or other patient management decisions. Negative results must be combined with clinical observations, patient history, and epidemiological information. The expected result is Negative. Fact Sheet for Patients: SugarRoll.be Fact Sheet for Healthcare Providers: https://www.woods-mathews.com/ This test is not yet approved or cleared by the [place] FDA and  has been authorized for detection and/or diagnosis of SARS-CoV-2 by FDA under an Emergency Use Authorization (EUA). This EUA will remain  in effect (meaning this test can be used) for the duration of the COVID-19 declaration under Section 56 4(b)(1) of the Act, 21 U.S.C. section 360bbb-3(b)(1), unless the authorization is terminated or revoked sooner. Performed at [facility], [place] [address], [place], [place] [address]   Glucose, capillary     Status: Abnormal   Collection Time: 04/23/19  7:13 AM  Result Value Ref Range   Glucose-Capillary 57 (L) 70 - 99 mg/dL  Glucose, capillary     Status: Abnormal   Collection Time: 04/23/19  7:52 AM  Result Value Ref Range   Glucose-Capillary 64 (L) 70 - 99 mg/dL  Surgical pathology     Status: None   Collection  Time: 04/23/19  8:04 AM  Result Value Ref Range   SURGICAL PATHOLOGY      Surgical Pathology CASE: [id number] PATIENT: Loistine Chance Surgical Pathology Report     SPECIMEN SUBMITTED: A. Stomach, antrum; cbx B. Stomach, body; cbx C. GEJ; cbx  CLINICAL HISTORY: None provided  PRE-OPERATIVE DIAGNOSIS: IDA  POST-OPERATIVE DIAGNOSIS: Hiatal hernia, minimal duodenitis, diverticulosis     DIAGNOSIS: A. STOMACH, ANTRUM; COLD BIOPSY: - GASTRIC ANTRAL MUCOSA WITH MINIMAL CHRONIC INFLAMMATION AND REACTIVE CHANGES. - NEGATIVE FOR ACTIVE INFLAMMATION AND H PYLORI. - NEGATIVE FOR INTESTINAL METAPLASIA, DYSPLASIA, AND MALIGNANCY.  B. STOMACH, BODY; COLD BIOPSY: - GASTRIC OXYNTIC MUCOSA WITH MILD CHRONIC INFLAMMATION. - NEGATIVE FOR ACTIVE INFLAMMATION AND H PYLORI. - NEGATIVE FOR INTESTINAL METAPLASIA, DYSPLASIA, AND MALIGNANCY.  C. GEJ; COLD BIOPSY: - SQUAMOCOLUMNAR JUNCTION WITH CHRONIC INFLAMMATION. - NEGATIVE FOR INTESTINAL METAPLASIA, DYSPLASIA, AND MALIGNANCY.   GROSS DESCRIPTION: A. Labeled: Antrum C BXs Received: Form alin Tissue fragment(s): 2 Size: 0.3-0.4 cm Description: Tan soft tissue fragments Entirely submitted in 1 cassette.  B. Labeled: Body of stomach C BXs Received: Formalin Tissue fragment(s): 2 Size: 0.3 cm Description: Tan soft tissue fragments Entirely submitted in 1 cassette.  C. Labeled: GEJ C BXs Received: Formalin Tissue fragment(s): Multiple Size: Aggregate, 1.0 x 0.2 x 0.1 cm Description: Tan soft tissue fragments Entirely submitted in 1 cassette.    Final Diagnosis performed by Betsy Pries, MD.   Electronically signed 04/24/2019 11:12:11AM The electronic signature indicates that the named Attending Pathologist has evaluated the specimen  Technical component performed at [facility], [address], [place], [place] [address] Lab: [phone] Dir: Rush Farmer, MD, MMM  Professional component performed at [facility], [facility], [place], [place], [place] [address] Lab: [phone] Dir: Dellia Nims. Rubinas, MD   SARS CORONAVIRUS 2 (TAT 6-24 HRS) Nasopharyngeal Nasopharyngeal Swab     Status: None   Collection Time: 05/21/19  1:06 PM   Specimen: Nasopharyngeal Swab  Result Value Ref Range   SARS Coronavirus 2 NEGATIVE NEGATIVE    Comment: (NOTE) SARS-CoV-2 target nucleic acids are NOT DETECTED. The SARS-CoV-2 RNA is generally detectable in upper and lower respiratory specimens during the acute phase of infection. Negative results do not preclude SARS-CoV-2 infection, do not rule out co-infections with other pathogens, and should not be used as the sole basis for treatment or other patient management decisions. Negative results must be combined with clinical observations, patient history, and epidemiological information. The expected result is Negative. Fact Sheet for Patients: SugarRoll.be Fact Sheet for Healthcare Providers: https://www.woods-mathews.com/ This test is not yet approved or cleared by the [place] FDA and  has been authorized for detection and/or diagnosis of SARS-CoV-2 by FDA under an Emergency Use Authorization (EUA). This EUA will remain  in effect (meaning this test can be used) for the duration of the COVID-19 declaration under Section 56 4(b)(1) of the Act, 21 U.S.C. section 360bbb-3(b)(1), unless the authorization is terminated or revoked sooner. Performed at [facility], [place] [address] [place], [place] [address]   BUN     Status: Abnormal   Collection Time: 05/23/19  8:05 AM  Result Value Ref Range   BUN 25 (H) 8 - 23 mg/dL    Comment: Performed at [facility], [address] [place], [place] [address]  Creatinine, serum     Status: None   Collection Time: 05/23/19  8:05 AM  Result Value  Ref Range   Creatinine, Ser 1.11 0.61 - 1.24 mg/dL   GFR calc non Af Amer >60 >60 mL/min   GFR  calc Af Amer >60 >60 mL/min    Comment: Performed at [facility], [address] [place], [place] [address]  Glucose, capillary     Status: Abnormal   Collection Time: 05/23/19  8:05 AM  Result Value Ref Range   Glucose-Capillary 136 (H) 70 - 99 mg/dL  Glucose, capillary     Status: Abnormal   Collection Time: 05/23/19  9:21 AM  Result Value Ref Range   Glucose-Capillary 129 (H) 70 - 99 mg/dL    Radiology Vas [place] Mesenteric Duplex  Result Date: 06/04/2019 ABDOMINAL VISCERAL Indications: Moderate mid SMA stenosis.              05/14/2019 visceral angio. PTA and sent of SMA. Comparison Study: 05/10/2019 Performing Technologist: Concha Norway RVT  Examination Guidelines: A complete evaluation includes B-mode imaging, spectral Doppler, color Doppler, and power Doppler as needed of all accessible portions of each vessel. Bilateral testing is considered an integral part of a complete examination. Limited examinations for reoccurring indications may be performed as noted.  Duplex Findings: +--------------------+--------+--------+------+--------+  Mesenteric           PSV cm/s EDV cm/s Plaque Comments  +--------------------+--------+--------+------+--------+  Aorta Mid              109                              +--------------------+--------+--------+------+--------+  Celiac Artery Origin   189                              +--------------------+--------+--------+------+--------+  SMA Origin             258                              +--------------------+--------+--------+------+--------+  SMA Proximal           197                              +--------------------+--------+--------+------+--------+  SMA Mid                140                              +--------------------+--------+--------+------+--------+  SMA Distal             136                              +--------------------+--------+--------+------+--------+  CHA                    144                               +--------------------+--------+--------+------+--------+  Splenic                138                              +--------------------+--------+--------+------+--------+  IMA  77                              +--------------------+--------+--------+------+--------+    Summary: Mesenteric: Normal Celiac artery , Inferior Mesenteric artery, Splenic artery and Hepatic artery findings. Moderate atherosclerosis throughout the abdominal aorta. SMA appears widely patent throughout status post PTA and stent placement in the SMA. Velocities are increased right at the origin of the SMA most likely due to aortic atherosclerosis. Improved flow compared to the previous study.  *See table(s) above for measurements and observations.  Diagnosing physician: [name] MD  Electronically signed by [name] MD on 06/04/2019 at 4:51:53 PM.    Final     Assessment/Plan  HTN (hypertension), benign blood pressure control important in reducing the progression of atherosclerotic disease. On appropriate oral medications.   DM type 2 with diabetic peripheral neuropathy (HCC) blood glucose control important in reducing the progression of atherosclerotic disease. Also, involved in wound healing. On appropriate medications.   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  Duplex from a couple of weeks ago showed improvement in the velocities in the SMA although there is still slightly elevated velocities at the origin.  At this point, we will plan to continue the current medical regimen and recheck a duplex in 6 months unless he develops a problem in the interim.    [name], MD  07/02/2019 9:22 AM    This note was created with Dragon medical transcription system.  Any errors from dictation are purely unintentional

## 2019-07-02 NOTE — Assessment & Plan Note (Signed)
Improved after intervention

## 2019-07-18 DIAGNOSIS — E113412 Type 2 diabetes mellitus with severe nonproliferative diabetic retinopathy with macular edema, left eye: Secondary | ICD-10-CM | POA: Diagnosis not present

## 2019-08-12 DIAGNOSIS — F323 Major depressive disorder, single episode, severe with psychotic features: Secondary | ICD-10-CM | POA: Diagnosis not present

## 2019-08-15 DIAGNOSIS — E113412 Type 2 diabetes mellitus with severe nonproliferative diabetic retinopathy with macular edema, left eye: Secondary | ICD-10-CM | POA: Diagnosis not present

## 2019-08-23 DIAGNOSIS — G2119 Other drug induced secondary parkinsonism: Secondary | ICD-10-CM | POA: Diagnosis not present

## 2019-08-23 DIAGNOSIS — N183 Chronic kidney disease, stage 3 unspecified: Secondary | ICD-10-CM | POA: Diagnosis not present

## 2019-08-23 DIAGNOSIS — E1122 Type 2 diabetes mellitus with diabetic chronic kidney disease: Secondary | ICD-10-CM | POA: Diagnosis not present

## 2019-08-23 DIAGNOSIS — I10 Essential (primary) hypertension: Secondary | ICD-10-CM | POA: Diagnosis not present

## 2019-08-23 DIAGNOSIS — F325 Major depressive disorder, single episode, in full remission: Secondary | ICD-10-CM | POA: Diagnosis not present

## 2019-08-23 DIAGNOSIS — E1169 Type 2 diabetes mellitus with other specified complication: Secondary | ICD-10-CM | POA: Diagnosis not present

## 2019-08-23 DIAGNOSIS — G4733 Obstructive sleep apnea (adult) (pediatric): Secondary | ICD-10-CM | POA: Diagnosis not present

## 2019-08-23 DIAGNOSIS — E1142 Type 2 diabetes mellitus with diabetic polyneuropathy: Secondary | ICD-10-CM | POA: Diagnosis not present

## 2019-09-09 ENCOUNTER — Other Ambulatory Visit (INDEPENDENT_AMBULATORY_CARE_PROVIDER_SITE_OTHER): Payer: Self-pay | Admitting: Nurse Practitioner

## 2019-09-09 MED ORDER — CLOPIDOGREL BISULFATE 75 MG PO TABS: 75.0000 mg | ORAL_TABLET | Freq: Every day | ORAL | 11 refills | Status: DC

## 2019-09-16 ENCOUNTER — Other Ambulatory Visit (INDEPENDENT_AMBULATORY_CARE_PROVIDER_SITE_OTHER): Payer: Self-pay | Admitting: Nurse Practitioner

## 2019-09-20 DIAGNOSIS — E1142 Type 2 diabetes mellitus with diabetic polyneuropathy: Secondary | ICD-10-CM | POA: Diagnosis not present

## 2019-09-20 DIAGNOSIS — E1169 Type 2 diabetes mellitus with other specified complication: Secondary | ICD-10-CM | POA: Diagnosis not present

## 2019-09-20 DIAGNOSIS — E1165 Type 2 diabetes mellitus with hyperglycemia: Secondary | ICD-10-CM | POA: Diagnosis not present

## 2019-09-20 DIAGNOSIS — E785 Hyperlipidemia, unspecified: Secondary | ICD-10-CM | POA: Diagnosis not present

## 2019-09-20 DIAGNOSIS — Z794 Long term (current) use of insulin: Secondary | ICD-10-CM | POA: Diagnosis not present

## 2019-09-20 DIAGNOSIS — I10 Essential (primary) hypertension: Secondary | ICD-10-CM | POA: Diagnosis not present

## 2019-09-25 DIAGNOSIS — E113413 Type 2 diabetes mellitus with severe nonproliferative diabetic retinopathy with macular edema, bilateral: Secondary | ICD-10-CM | POA: Diagnosis not present

## 2019-09-27 DIAGNOSIS — N183 Chronic kidney disease, stage 3 unspecified: Secondary | ICD-10-CM | POA: Diagnosis not present

## 2019-09-27 DIAGNOSIS — E1159 Type 2 diabetes mellitus with other circulatory complications: Secondary | ICD-10-CM | POA: Diagnosis not present

## 2019-09-27 DIAGNOSIS — E1165 Type 2 diabetes mellitus with hyperglycemia: Secondary | ICD-10-CM | POA: Insufficient documentation

## 2019-09-27 DIAGNOSIS — I10 Essential (primary) hypertension: Secondary | ICD-10-CM | POA: Diagnosis not present

## 2019-09-27 DIAGNOSIS — Z794 Long term (current) use of insulin: Secondary | ICD-10-CM | POA: Diagnosis not present

## 2019-09-27 DIAGNOSIS — E1142 Type 2 diabetes mellitus with diabetic polyneuropathy: Secondary | ICD-10-CM | POA: Diagnosis not present

## 2019-09-27 DIAGNOSIS — E113393 Type 2 diabetes mellitus with moderate nonproliferative diabetic retinopathy without macular edema, bilateral: Secondary | ICD-10-CM | POA: Diagnosis not present

## 2019-09-27 DIAGNOSIS — R42 Dizziness and giddiness: Secondary | ICD-10-CM | POA: Diagnosis not present

## 2019-09-27 DIAGNOSIS — E1122 Type 2 diabetes mellitus with diabetic chronic kidney disease: Secondary | ICD-10-CM | POA: Diagnosis not present

## 2019-10-01 ENCOUNTER — Other Ambulatory Visit: Payer: Self-pay

## 2019-10-01 ENCOUNTER — Emergency Department: Payer: Medicare HMO

## 2019-10-01 ENCOUNTER — Inpatient Hospital Stay
Admission: EM | Admit: 2019-10-01 | Discharge: 2019-10-05 | DRG: 247 | Disposition: A | Discharge status: Home / Self Care | Payer: Medicare HMO | Attending: Internal Medicine | Admitting: Internal Medicine

## 2019-10-01 DIAGNOSIS — F419 Anxiety disorder, unspecified: Secondary | ICD-10-CM | POA: Diagnosis present

## 2019-10-01 DIAGNOSIS — R079 Chest pain, unspecified: Secondary | ICD-10-CM | POA: Diagnosis present

## 2019-10-01 DIAGNOSIS — K5730 Diverticulosis of large intestine without perforation or abscess without bleeding: Secondary | ICD-10-CM | POA: Diagnosis present

## 2019-10-01 DIAGNOSIS — S0990XA Unspecified injury of head, initial encounter: Secondary | ICD-10-CM | POA: Diagnosis not present

## 2019-10-01 DIAGNOSIS — E1140 Type 2 diabetes mellitus with diabetic neuropathy, unspecified: Secondary | ICD-10-CM | POA: Diagnosis not present

## 2019-10-01 DIAGNOSIS — M25512 Pain in left shoulder: Secondary | ICD-10-CM | POA: Diagnosis present

## 2019-10-01 DIAGNOSIS — G20 Parkinson's disease: Secondary | ICD-10-CM | POA: Diagnosis present

## 2019-10-01 DIAGNOSIS — D6489 Other specified anemias: Secondary | ICD-10-CM | POA: Diagnosis not present

## 2019-10-01 DIAGNOSIS — D649 Anemia, unspecified: Secondary | ICD-10-CM | POA: Diagnosis present

## 2019-10-01 DIAGNOSIS — N183 Chronic kidney disease, stage 3 unspecified: Secondary | ICD-10-CM | POA: Diagnosis present

## 2019-10-01 DIAGNOSIS — D631 Anemia in chronic kidney disease: Secondary | ICD-10-CM | POA: Diagnosis present

## 2019-10-01 DIAGNOSIS — D500 Iron deficiency anemia secondary to blood loss (chronic): Secondary | ICD-10-CM | POA: Diagnosis not present

## 2019-10-01 DIAGNOSIS — E1169 Type 2 diabetes mellitus with other specified complication: Secondary | ICD-10-CM | POA: Diagnosis present

## 2019-10-01 DIAGNOSIS — K219 Gastro-esophageal reflux disease without esophagitis: Secondary | ICD-10-CM | POA: Diagnosis present

## 2019-10-01 DIAGNOSIS — Z20822 Contact with and (suspected) exposure to covid-19: Secondary | ICD-10-CM | POA: Diagnosis present

## 2019-10-01 DIAGNOSIS — R0789 Other chest pain: Secondary | ICD-10-CM | POA: Diagnosis not present

## 2019-10-01 DIAGNOSIS — K2950 Unspecified chronic gastritis without bleeding: Secondary | ICD-10-CM | POA: Diagnosis present

## 2019-10-01 DIAGNOSIS — S199XXA Unspecified injury of neck, initial encounter: Secondary | ICD-10-CM | POA: Diagnosis not present

## 2019-10-01 DIAGNOSIS — Z7982 Long term (current) use of aspirin: Secondary | ICD-10-CM

## 2019-10-01 DIAGNOSIS — G4733 Obstructive sleep apnea (adult) (pediatric): Secondary | ICD-10-CM | POA: Diagnosis present

## 2019-10-01 DIAGNOSIS — I700 Atherosclerosis of aorta: Secondary | ICD-10-CM | POA: Diagnosis present

## 2019-10-01 DIAGNOSIS — Z833 Family history of diabetes mellitus: Secondary | ICD-10-CM

## 2019-10-01 DIAGNOSIS — K449 Diaphragmatic hernia without obstruction or gangrene: Secondary | ICD-10-CM | POA: Diagnosis present

## 2019-10-01 DIAGNOSIS — I2510 Atherosclerotic heart disease of native coronary artery without angina pectoris: Secondary | ICD-10-CM | POA: Diagnosis not present

## 2019-10-01 DIAGNOSIS — Z79899 Other long term (current) drug therapy: Secondary | ICD-10-CM

## 2019-10-01 DIAGNOSIS — N189 Chronic kidney disease, unspecified: Secondary | ICD-10-CM | POA: Diagnosis not present

## 2019-10-01 DIAGNOSIS — I10 Essential (primary) hypertension: Secondary | ICD-10-CM | POA: Diagnosis not present

## 2019-10-01 DIAGNOSIS — I25110 Atherosclerotic heart disease of native coronary artery with unstable angina pectoris: Principal | ICD-10-CM | POA: Diagnosis present

## 2019-10-01 DIAGNOSIS — I200 Unstable angina: Secondary | ICD-10-CM | POA: Diagnosis not present

## 2019-10-01 DIAGNOSIS — K551 Chronic vascular disorders of intestine: Secondary | ICD-10-CM | POA: Diagnosis present

## 2019-10-01 DIAGNOSIS — E1142 Type 2 diabetes mellitus with diabetic polyneuropathy: Secondary | ICD-10-CM

## 2019-10-01 DIAGNOSIS — Z9181 History of falling: Secondary | ICD-10-CM

## 2019-10-01 DIAGNOSIS — D509 Iron deficiency anemia, unspecified: Secondary | ICD-10-CM | POA: Diagnosis present

## 2019-10-01 DIAGNOSIS — E785 Hyperlipidemia, unspecified: Secondary | ICD-10-CM | POA: Diagnosis present

## 2019-10-01 DIAGNOSIS — Z825 Family history of asthma and other chronic lower respiratory diseases: Secondary | ICD-10-CM

## 2019-10-01 DIAGNOSIS — W19XXXA Unspecified fall, initial encounter: Secondary | ICD-10-CM | POA: Diagnosis not present

## 2019-10-01 DIAGNOSIS — Z95828 Presence of other vascular implants and grafts: Secondary | ICD-10-CM

## 2019-10-01 DIAGNOSIS — N289 Disorder of kidney and ureter, unspecified: Secondary | ICD-10-CM | POA: Diagnosis not present

## 2019-10-01 DIAGNOSIS — F325 Major depressive disorder, single episode, in full remission: Secondary | ICD-10-CM | POA: Diagnosis present

## 2019-10-01 DIAGNOSIS — Z880 Allergy status to penicillin: Secondary | ICD-10-CM

## 2019-10-01 DIAGNOSIS — N179 Acute kidney failure, unspecified: Secondary | ICD-10-CM | POA: Diagnosis present

## 2019-10-01 DIAGNOSIS — E1122 Type 2 diabetes mellitus with diabetic chronic kidney disease: Secondary | ICD-10-CM | POA: Diagnosis present

## 2019-10-01 DIAGNOSIS — I129 Hypertensive chronic kidney disease with stage 1 through stage 4 chronic kidney disease, or unspecified chronic kidney disease: Secondary | ICD-10-CM | POA: Diagnosis present

## 2019-10-01 DIAGNOSIS — Z888 Allergy status to other drugs, medicaments and biological substances status: Secondary | ICD-10-CM

## 2019-10-01 DIAGNOSIS — Z7902 Long term (current) use of antithrombotics/antiplatelets: Secondary | ICD-10-CM

## 2019-10-01 DIAGNOSIS — D62 Acute posthemorrhagic anemia: Secondary | ICD-10-CM | POA: Diagnosis not present

## 2019-10-01 DIAGNOSIS — Z9049 Acquired absence of other specified parts of digestive tract: Secondary | ICD-10-CM

## 2019-10-01 DIAGNOSIS — I249 Acute ischemic heart disease, unspecified: Secondary | ICD-10-CM | POA: Diagnosis not present

## 2019-10-01 DIAGNOSIS — Z955 Presence of coronary angioplasty implant and graft: Secondary | ICD-10-CM | POA: Diagnosis not present

## 2019-10-01 DIAGNOSIS — Z96651 Presence of right artificial knee joint: Secondary | ICD-10-CM | POA: Diagnosis present

## 2019-10-01 DIAGNOSIS — Z8249 Family history of ischemic heart disease and other diseases of the circulatory system: Secondary | ICD-10-CM

## 2019-10-01 DIAGNOSIS — Z9089 Acquired absence of other organs: Secondary | ICD-10-CM

## 2019-10-01 DIAGNOSIS — Z794 Long term (current) use of insulin: Secondary | ICD-10-CM

## 2019-10-01 LAB — CBC WITH DIFFERENTIAL/PLATELET
Abs Immature Granulocytes: 0.04 10*3/uL (ref 0.00–0.07)
Basophils Absolute: 0 10*3/uL (ref 0.0–0.1)
Basophils Relative: 0 %
Eosinophils Absolute: 0.2 10*3/uL (ref 0.0–0.5)
Eosinophils Relative: 2 %
HCT: 26.4 % — ABNORMAL LOW (ref 39.0–52.0)
Hemoglobin: 8 g/dL — ABNORMAL LOW (ref 13.0–17.0)
Immature Granulocytes: 0 %
Lymphocytes Relative: 15 %
Lymphs Abs: 1.3 10*3/uL (ref 0.7–4.0)
MCH: 24.6 pg — ABNORMAL LOW (ref 26.0–34.0)
MCHC: 30.3 g/dL (ref 30.0–36.0)
MCV: 81.2 fL (ref 80.0–100.0)
Monocytes Absolute: 0.8 10*3/uL (ref 0.1–1.0)
Monocytes Relative: 9 %
Neutro Abs: 6.5 10*3/uL (ref 1.7–7.7)
Neutrophils Relative %: 74 %
Platelets: 254 10*3/uL (ref 150–400)
RBC: 3.25 MIL/uL — ABNORMAL LOW (ref 4.22–5.81)
RDW: 16.3 % — ABNORMAL HIGH (ref 11.5–15.5)
WBC: 8.9 10*3/uL (ref 4.0–10.5)
nRBC: 0 % (ref 0.0–0.2)

## 2019-10-01 LAB — BASIC METABOLIC PANEL
Anion gap: 9 (ref 5–15)
BUN: 35 mg/dL — ABNORMAL HIGH (ref 8–23)
CO2: 24 mmol/L (ref 22–32)
Calcium: 8.7 mg/dL — ABNORMAL LOW (ref 8.9–10.3)
Chloride: 106 mmol/L (ref 98–111)
Creatinine, Ser: 1.62 mg/dL — ABNORMAL HIGH (ref 0.61–1.24)
GFR calc Af Amer: 48 mL/min — ABNORMAL LOW (ref >60)
GFR calc non Af Amer: 41 mL/min — ABNORMAL LOW (ref >60)
Glucose, Bld: 108 mg/dL — ABNORMAL HIGH (ref 70–99)
Potassium: 4.8 mmol/L (ref 3.5–5.1)
Sodium: 139 mmol/L (ref 135–145)

## 2019-10-01 LAB — GLUCOSE, CAPILLARY: Glucose-Capillary: 87 mg/dL (ref 70–99)

## 2019-10-01 LAB — TROPONIN I (HIGH SENSITIVITY)
Troponin I (High Sensitivity): 24 ng/L — ABNORMAL HIGH (ref <18)
Troponin I (High Sensitivity): 21 ng/L — ABNORMAL HIGH (ref <18)

## 2019-10-01 MED ORDER — INSULIN ASPART 100 UNIT/ML ~~LOC~~ SOLN
0.0000 [IU] | Freq: Three times a day (TID) | SUBCUTANEOUS | Status: DC
  Administered 2019-10-02: 3 [IU] via SUBCUTANEOUS
  Administered 2019-10-03: 5 [IU] via SUBCUTANEOUS
  Administered 2019-10-03: 2 [IU] via SUBCUTANEOUS
  Administered 2019-10-04: 3 [IU] via SUBCUTANEOUS
  Administered 2019-10-04: 5 [IU] via SUBCUTANEOUS
  Administered 2019-10-05: 2 [IU] via SUBCUTANEOUS
  Administered 2019-10-05: 3 [IU] via SUBCUTANEOUS
  Filled 2019-10-01 (×7): qty 1

## 2019-10-01 MED ORDER — GABAPENTIN 400 MG PO CAPS
400.0000 mg | ORAL_CAPSULE | Freq: Every day | ORAL | Status: DC
  Administered 2019-10-02 – 2019-10-04 (×4): 400 mg via ORAL
  Filled 2019-10-01 (×4): qty 1
  Filled 2019-10-01: qty 4

## 2019-10-01 MED ORDER — PAROXETINE HCL 20 MG PO TABS
40.0000 mg | ORAL_TABLET | Freq: Every day | ORAL | Status: DC
  Administered 2019-10-02 – 2019-10-05 (×4): 40 mg via ORAL
  Filled 2019-10-01: qty 1
  Filled 2019-10-01 (×6): qty 2

## 2019-10-01 MED ORDER — ASPIRIN 81 MG PO CHEW
324.0000 mg | CHEWABLE_TABLET | Freq: Once | ORAL | Status: DC
  Filled 2019-10-01: qty 4

## 2019-10-01 MED ORDER — ENOXAPARIN SODIUM 40 MG/0.4ML ~~LOC~~ SOLN
40.0000 mg | SUBCUTANEOUS | Status: DC
  Administered 2019-10-01: 40 mg via SUBCUTANEOUS
  Filled 2019-10-01: qty 0.4

## 2019-10-01 MED ORDER — POLYSACCHARIDE IRON COMPLEX 150 MG PO CAPS
150.0000 mg | ORAL_CAPSULE | Freq: Every day | ORAL | Status: DC
  Administered 2019-10-02 – 2019-10-05 (×4): 150 mg via ORAL
  Filled 2019-10-01 (×5): qty 1

## 2019-10-01 MED ORDER — ASPIRIN EC 81 MG PO TBEC
81.0000 mg | DELAYED_RELEASE_TABLET | Freq: Every day | ORAL | Status: DC
  Administered 2019-10-02 – 2019-10-05 (×3): 81 mg via ORAL
  Filled 2019-10-01 (×4): qty 1

## 2019-10-01 MED ORDER — CLONAZEPAM 0.5 MG PO TABS
0.5000 mg | ORAL_TABLET | Freq: Every evening | ORAL | Status: DC | PRN
  Administered 2019-10-02 – 2019-10-04 (×2): 0.5 mg via ORAL
  Filled 2019-10-01 (×2): qty 1

## 2019-10-01 MED ORDER — TOPIRAMATE 100 MG PO TABS
100.0000 mg | ORAL_TABLET | Freq: Every day | ORAL | Status: DC
  Administered 2019-10-02 – 2019-10-05 (×4): 100 mg via ORAL
  Filled 2019-10-01 (×4): qty 1

## 2019-10-01 MED ORDER — ACETAMINOPHEN 325 MG PO TABS
650.0000 mg | ORAL_TABLET | ORAL | Status: DC | PRN
  Filled 2019-10-01: qty 2

## 2019-10-01 MED ORDER — SODIUM CHLORIDE 0.9 % IV BOLUS
500.0000 mL | Freq: Once | INTRAVENOUS | Status: AC
  Administered 2019-10-01: 500 mL via INTRAVENOUS

## 2019-10-01 MED ORDER — CLOPIDOGREL BISULFATE 75 MG PO TABS
75.0000 mg | ORAL_TABLET | Freq: Every day | ORAL | Status: DC
  Administered 2019-10-02 – 2019-10-05 (×3): 75 mg via ORAL
  Filled 2019-10-01 (×3): qty 1

## 2019-10-01 MED ORDER — NITROGLYCERIN 0.4 MG SL SUBL
0.4000 mg | SUBLINGUAL_TABLET | SUBLINGUAL | Status: AC | PRN
  Administered 2019-10-01 – 2019-10-02 (×4): 0.4 mg via SUBLINGUAL
  Filled 2019-10-01 (×3): qty 1

## 2019-10-01 MED ORDER — LABETALOL HCL 5 MG/ML IV SOLN: 5.0000 mg | INTRAVENOUS | Status: DC | PRN

## 2019-10-01 MED ORDER — PANTOPRAZOLE SODIUM 40 MG PO TBEC
40.0000 mg | DELAYED_RELEASE_TABLET | Freq: Every day | ORAL | Status: DC
  Administered 2019-10-02 – 2019-10-05 (×4): 40 mg via ORAL
  Filled 2019-10-01 (×5): qty 1

## 2019-10-01 MED ORDER — VITAMIN B-12 1000 MCG PO TABS
1000.0000 ug | ORAL_TABLET | Freq: Every day | ORAL | Status: DC
  Administered 2019-10-02 – 2019-10-05 (×4): 1000 ug via ORAL
  Filled 2019-10-01 (×5): qty 1

## 2019-10-01 MED ORDER — QUETIAPINE FUMARATE 25 MG PO TABS
100.0000 mg | ORAL_TABLET | Freq: Every day | ORAL | Status: DC
  Administered 2019-10-01 – 2019-10-04 (×4): 100 mg via ORAL
  Filled 2019-10-01 (×4): qty 4

## 2019-10-01 MED ORDER — ATORVASTATIN CALCIUM 80 MG PO TABS
80.0000 mg | ORAL_TABLET | Freq: Every day | ORAL | Status: DC
  Administered 2019-10-01 – 2019-10-04 (×4): 80 mg via ORAL
  Filled 2019-10-01: qty 4
  Filled 2019-10-01 (×3): qty 1

## 2019-10-01 MED ORDER — ONDANSETRON HCL 4 MG/2ML IJ SOLN: 4.0000 mg | Freq: Four times a day (QID) | INTRAMUSCULAR | Status: DC | PRN

## 2019-10-01 MED ORDER — MORPHINE SULFATE (PF) 4 MG/ML IV SOLN
4.0000 mg | Freq: Once | INTRAVENOUS | Status: AC
  Administered 2019-10-01: 4 mg via INTRAVENOUS
  Filled 2019-10-01: qty 1

## 2019-10-01 MED ORDER — MORPHINE SULFATE (PF) 2 MG/ML IV SOLN
1.0000 mg | INTRAVENOUS | Status: DC | PRN
  Administered 2019-10-02 (×3): 1 mg via INTRAVENOUS
  Filled 2019-10-01 (×3): qty 1

## 2019-10-01 NOTE — ED Provider Notes (Signed)
[facility] Emergency Department Provider Note   ____________________________________________   First MD Initiated Contact with Patient 10/01/19 1907     (approximate)  I have reviewed the triage vital signs and the nursing notes.   HISTORY  Chief Complaint Chest Pain    HPI Patrick Turner is a 74 y.o. male with past medical history of hypertension, diabetes, CKD, and hyperlipidemia who presents to the ED complaining of chest pain.  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  Pain seemed to worsen throughout the evening and he began to feel lightheaded, eventually decided to call EMS.  He states he was feeling well when he went to work and denies any recent fevers, cough, or shortness of breath.  He does state that he had a fall 2 nights ago where he lost his balance on the stairs and fell onto his left side.  He admits hitting his head, does not think he lost consciousness, but is unsure.  He does take Plavix due to issues with aortic atherosclerosis.  He states he had some pain along his left chest wall last night into this evening, but that seemed to be a different pain from the pain that came on to his anterior chest.  He also complains of pain at his left shoulder since the fall.        Past Medical History:  Diagnosis Date  . Altered mental status   . Anemia   . Anxiety   . Aortic atherosclerosis ([place])   . Arthritis   . Coronary artery disease   . CRD (chronic renal disease)   . Depression   . Diabetes mellitus without complication ([place])   . Encephalopathy acute   . GERD (gastroesophageal reflux disease)   . Headache   . Hypercholesteremia   . Hypertension   . Neuropathy   . Severe obesity ([place])   . Sleep apnea   . Sleep terror    per patient this year per patient     Patient Active Problem List   Diagnosis Date Noted  . Chest  pain 10/01/2019  . Chronic mesenteric ischemia ([place]) 05/06/2019  . Abdominal pain 05/03/2019  . Acute on chronic renal failure ([place]) 03/23/2019  . AMS (altered mental status) 12/24/2018  . Dizziness 06/30/2018  . HLD (hyperlipidemia) 02/23/2018  . Aortic atherosclerosis ([place]) 11/02/2017  . Healthcare maintenance 12/26/2016  . DM type 2 with diabetic peripheral neuropathy ([place]) 11/09/2016  . Presence of right artificial knee joint 08/14/2016  . Bilateral carotid artery disease ([place]) 07/12/2016  . 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 Acute diarrhea 05/30/2016  . Encephalopathy acute 05/30/2016  . Anemia 05/30/2016  . Acute on chronic renal insufficiency 05/30/2016  . Diarrhea 05/30/2016  . Depression, major, in remission ([place]) 08/02/2014  . Severe obesity (BMI 35.0-39.9) with comorbidity ([place]) 07/12/2014  . GERD (gastroesophageal reflux disease) 03/08/2014  . Hyperlipidemia associated with type 2 diabetes mellitus ([place]) 03/08/2014  . 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 Diabetes mellitus with stage 3 chronic kidney disease ([place]) 01/21/2014  . HTN (hypertension), benign 01/21/2014  . OSA (obstructive sleep apnea) 01/21/2014  . Unspecified transient cerebral ischemia 05/29/2012    Past Surgical History:  Procedure Laterality Date  . APPENDECTOMY    . CIRCUMCISION    . COLONOSCOPY    . COLONOSCOPY WITH PROPOFOL N/A 07/10/2018   Procedure: COLONOSCOPY WITH PROPOFOL;  Surgeon:  Lollie Sails, MD;  Location: [facility] ENDOSCOPY;  Service: Endoscopy;  Laterality: N/A;  . COLONOSCOPY WITH PROPOFOL N/A 04/23/2019   Procedure: COLONOSCOPY WITH PROPOFOL;  Surgeon: Lollie Sails, MD;  Location: [facility] ENDOSCOPY;  Service: Endoscopy;  Laterality: N/A;  . ESOPHAGOGASTRODUODENOSCOPY    . ESOPHAGOGASTRODUODENOSCOPY (EGD) WITH PROPOFOL N/A 04/23/2019   Procedure: ESOPHAGOGASTRODUODENOSCOPY (EGD) WITH PROPOFOL;  Surgeon: Lollie Sails, MD;  Location: [facility] ENDOSCOPY;  Service:  Endoscopy;  Laterality: N/A;  . JOINT REPLACEMENT    . 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 KNEE ARTHROSCOPY Right   . TONSILLECTOMY    . VISCERAL ANGIOGRAPHY N/A 05/23/2019   Procedure: VISCERAL ANGIOGRAPHY;  Surgeon: Algernon Huxley, MD;  Location: [place] CV LAB;  Service: Cardiovascular;  Laterality: N/A;    Prior to Admission medications   Medication Sig Start Date End Date Taking? Authorizing Provider  aspirin 81 MG tablet Take 1 tablet (81 mg total) by mouth daily. 02/23/18  Yes Henreitta Leber, MD  atorvastatin (LIPITOR) 80 MG tablet Take 80 mg by mouth at bedtime. 12/14/15  Yes [provider]  b complex vitamins capsule Take 1 capsule by mouth daily.   Yes [provider]  clonazePAM (KLONOPIN) 1 MG tablet Take 0.5 mg by mouth at bedtime as needed for anxiety.  12/18/15  Yes [provider]  clopidogrel (PLAVIX) 75 MG tablet Take 1 tablet (75 mg total) by mouth daily. 09/09/19  Yes Kris Hartmann, NP  gabapentin (NEURONTIN) 400 MG capsule Take 400 mg by mouth daily.    Yes [provider]  insulin detemir (LEVEMIR) 100 UNIT/ML injection Inject 15 Units into the skin daily. 03/04/19  Yes [provider]  iron polysaccharides (NIFEREX) 150 MG capsule Take 150 mg by mouth daily.   Yes [provider]  lisinopril-hydrochlorothiazide (PRINZIDE,ZESTORETIC) 20-12.5 MG per tablet Take 1 tablet by mouth daily.    Yes [provider]  metFORMIN (GLUCOPHAGE-XR) 500 MG 24 hr tablet Take 1,000 mg by mouth 2 (two) times daily with a meal.   Yes [provider]  Omega-3 Fatty Acids (FISH OIL) 1000 MG CAPS Take 1,000 mg by mouth daily.    Yes [provider]  OZEMPIC, 0.25 OR 0.5 MG/DOSE, 2 MG/1.5ML SOPN Inject 1 Dose into the skin once a week.  03/04/19  Yes [provider]  pantoprazole (PROTONIX) 40 MG tablet Take 40 mg by mouth daily.   Yes [provider]  PARoxetine (PAXIL) 40 MG tablet Take 40 mg by mouth daily.    Yes [provider]  pioglitazone (ACTOS) 15 MG tablet Take 15 mg by mouth daily.    Yes [provider]  QUEtiapine (SEROQUEL) 100 MG tablet Take 100 mg by mouth at bedtime. 12/22/15  Yes [provider]  topiramate (TOPAMAX) 100 MG tablet Take 100 mg by mouth daily.    Yes [provider]  vitamin B-12 (CYANOCOBALAMIN) 1000 MCG tablet Take 1,000 mcg by mouth daily.   Yes [provider]    Allergies Penicillins, Tavist [clemastine], and Loratadine  Family History  Problem Relation Age of Onset  . Diabetes Mother   . Heart disease Mother   . Cancer Father   . COPD Father     Social History Social History   Tobacco Use  . Smoking status: Never Smoker  .  Smokeless tobacco: Never Used  Substance Use Topics  . Alcohol use: Yes    Comment: rare  . Drug use: No    Review of Systems  Constitutional: No fever/chills.  Positive for lightheadedness. Eyes: No visual changes. ENT: No sore throat. Cardiovascular: Positive for chest pain. Respiratory: Denies shortness of breath. Gastrointestinal: No abdominal pain.  No nausea, no vomiting.  No diarrhea.  No constipation. Genitourinary: Negative for dysuria. Musculoskeletal: Negative for back pain. Skin: Negative for rash. Neurological: Negative for headaches, focal weakness or numbness.  ____________________________________________   PHYSICAL EXAM:  VITAL SIGNS: ED Triage Vitals  Enc Vitals Group     BP      Pulse      Resp      Temp      Temp src      SpO2      Weight      Height      Head Circumference      Peak Flow      Pain Score      Pain Loc      Pain Edu?      Excl. in [place]?     Constitutional: Alert and oriented. Eyes: Conjunctivae are normal. Head: Atraumatic. Nose: No  congestion/rhinnorhea. Mouth/Throat: Mucous membranes are moist. Neck: Normal ROM Cardiovascular: Normal rate, regular rhythm. Grossly normal heart sounds.  2+ radial pulses bilaterally. Respiratory: Normal respiratory effort.  No retractions. Lungs CTAB.  Tender to palpation over left lateral chest wall. Gastrointestinal: Soft and nontender. No distention. Genitourinary: deferred Musculoskeletal: No lower extremity tenderness nor edema.  Diffuse tenderness to palpation over left shoulder and clavicle. Neurologic:  Normal speech and language. No gross focal neurologic deficits are appreciated.  5-5 strength in bilateral upper and lower extremities, no pronator drift. Skin:  Skin is warm, dry and intact. No rash noted. Psychiatric: Mood and affect are normal. Speech and behavior are normal.  ____________________________________________   LABS (all labs ordered are listed, but only abnormal results are displayed)  Labs Reviewed  BASIC METABOLIC PANEL - Abnormal; Notable for the following components:      Result Value   Glucose, Bld 108 (*)    BUN 35 (*)    Creatinine, Ser 1.62 (*)    Calcium 8.7 (*)    GFR calc non Af Amer 41 (*)    GFR calc Af Amer 48 (*)    All other components within normal limits  CBC WITH DIFFERENTIAL/PLATELET - Abnormal; Notable for the following components:   RBC 3.25 (*)    Hemoglobin 8.0 (*)    HCT 26.4 (*)    MCH 24.6 (*)    RDW 16.3 (*)    All other components within normal limits  TROPONIN I (HIGH SENSITIVITY) - Abnormal; Notable for the following components:   Troponin I (High Sensitivity) 24 (*)    All other components within normal limits  TROPONIN I (HIGH SENSITIVITY) - Abnormal; Notable for the following components:   Troponin I (High Sensitivity) 21 (*)    All other components within normal limits  SARS CORONAVIRUS 2 (TAT 6-24 HRS)  GLUCOSE, CAPILLARY  HEMOGLOBIN A1C  TROPONIN I (HIGH SENSITIVITY)  TROPONIN I (HIGH SENSITIVITY)    ____________________________________________  EKG  ED ECG REPORT I, Blake Divine, the attending physician, personally viewed and interpreted this ECG.   Date: 10/01/2019  EKG Time: 19:11  Rate: 68  Rhythm: normal sinus rhythm  Axis: Normal  Intervals:none  ST&T Change: None   PROCEDURES  Procedure(s)  performed (including Critical Care):  Procedures   ____________________________________________   INITIAL IMPRESSION / ASSESSMENT AND PLAN / ED COURSE       74 year old male with history of hypertension, hyperlipidemia, CKD, diabetes, and aortic atherosclerosis on Plavix presents to the ED with onset of chest pain while he was at work this evening.  He additionally complains of some pain at his left shoulder and left chest wall following a fall last night where he hit his head.  Given he is anticoagulated, we will assess for traumatic injuries with CT of his head, C-spine, chest x-ray, and left shoulder x-ray.  Initial EKG shows no evidence of arrhythmia or ischemia, will plan to check 2 sets of troponin to assess for ACS.  Lower suspicion for PE given minimal risk factors and I doubt dissection.  Initial troponin very mildly elevated, however this is no more than I would expect given his chronic kidney disease.  Chest pain is improved following dose of morphine and imaging is negative for sequela of trauma.  He was given an additional dose of nitroglycerin with resolution of his chest pain.  He does seem to have multiple risk factors for ACS and his history is at least moderately concerning despite initial unremarkable troponin.  He was given aspirin load and case was discussed with hospitalist, who accepts patient for admission.      ____________________________________________   FINAL CLINICAL IMPRESSION(S) / ED DIAGNOSES  Final diagnoses:  Nonspecific chest pain  DM type 2 with diabetic peripheral neuropathy ([place])  HTN (hypertension), benign     ED Discharge  Orders    None       Note:  This document was prepared using Dragon voice recognition software and may include unintentional dictation errors.   Blake Divine, MD 10/02/19 [phone]

## 2019-10-01 NOTE — ED Notes (Signed)
Yellow High Fall Risk slip-resistant socks applied; yellow High Fall Risk bracelet placed on pt's right wrist at this time.

## 2019-10-01 NOTE — H&P (Signed)
History and Physical    Patrick Turner [id number] DOB: 06/30/1946 DOA: 10/01/2019  PCP: Kirk Ruths, MD  Patient coming from: work  I have personally briefly reviewed patient's old medical records in [place]  Chief Complaint: Chest pain  HPI: Patrick Turner is a 74 y.o. 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  Patient was working at [employer]	[place] and helping customers over the counter when around 6:00PM he noted sudden onset focal pressure left-sided chest pain.  Also had associated dizziness.  He felt like he was going to "fall over."  He then sat down but did not have any improvement in his symptoms.  Denies any associated shortness of breath.  No diaphoresis.  No nausea or vomiting.  Denies any previous history of chest pain or significant cardiac history.  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 Has had some coughing productive of sputum and runny nose but no fevers.  He recently had a fall 2 days ago going up the steps since he went "too fast" and did not have guardrails to hold onto.  He hit his head and left shoulder but denies any loss of consciousness.  States his pain is different from his shoulder pain. He denies any tobacco, alcohol illicit drug use.  Family history significant for mother who has heart disease.  Chest pain now resolved in the ED after getting 4mg  of morphine, aspirin and SQ nitro.  ED Course: He was afebrile, hypertensive up to [id number] systolic on room air.  Lab shows chronic Hgb at 8, AKI with creatinine at 1.62.  Troponin of 24. Sinus EKG without any ST or T wave changes.   Chest x-ray negative. CT head, cervical spine and left shoulder x-ray was  obtained due to recent fall and being on Plavix.  These were all negative.  Review of Systems: Constitutional: No Fever ENT/Mouth: No sore throat, N+o Rhinorrhea Eyes:  No Vision Changes Cardiovascular: + Chest Pain, no SOB Respiratory: + Cough, + Sputum Gastrointestinal: No Nausea, No Vomiting, No Diarrhea, No Constipation, No Pain Genitourinary: no dysuria Musculoskeletal: + Arthralgias, No Myalgias Skin: No Skin Lesions, No Pruritus, Neuro: no Weakness, No Numbness,  No Loss of Consciousness, No Syncope Psych: no decrease appetite Heme/Lymph: No Bruising, No Bleeding  Past Medical History:  Diagnosis Date  . Altered mental status   . Anemia   . Anxiety   . Aortic atherosclerosis ([place])   . Arthritis   . Coronary artery disease   . CRD (chronic renal disease)   . Depression   . Diabetes mellitus without complication ([place])   . Encephalopathy acute   . GERD (gastroesophageal reflux disease)   . Headache   . Hypercholesteremia   . Hypertension   . Neuropathy   . Severe obesity ([place])   . Sleep apnea   . Sleep terror    per patient this year per patient     Past Surgical History:  Procedure Laterality Date  . APPENDECTOMY    . CIRCUMCISION    . COLONOSCOPY    . COLONOSCOPY WITH PROPOFOL N/A 07/10/2018   Procedure: COLONOSCOPY WITH PROPOFOL;  Surgeon: Lollie Sails, MD;  Location: [facility] ENDOSCOPY;  Service: Endoscopy;  Laterality: N/A;  .  COLONOSCOPY WITH PROPOFOL N/A 04/23/2019   Procedure: COLONOSCOPY WITH PROPOFOL;  Surgeon: Lollie Sails, MD;  Location: [facility] ENDOSCOPY;  Service: Endoscopy;  Laterality: N/A;  . ESOPHAGOGASTRODUODENOSCOPY    . ESOPHAGOGASTRODUODENOSCOPY (EGD) WITH PROPOFOL N/A 04/23/2019   Procedure: ESOPHAGOGASTRODUODENOSCOPY (EGD) WITH PROPOFOL;  Surgeon: Lollie Sails, MD;  Location: [facility] ENDOSCOPY;  Service: Endoscopy;  Laterality: N/A;  . JOINT REPLACEMENT    . KNEE ARTHROPLASTY Right 06/27/2016   Procedure: COMPUTER ASSISTED TOTAL  KNEE ARTHROPLASTY;  Surgeon: Dereck Leep, MD;  Location: [facility] ORS;  Service: Orthopedics;  Laterality: Right;  . KNEE ARTHROSCOPY Right   . TONSILLECTOMY    . VISCERAL ANGIOGRAPHY N/A 05/23/2019   Procedure: VISCERAL ANGIOGRAPHY;  Surgeon: Algernon Huxley, MD;  Location: [facility] CV LAB;  Service: Cardiovascular;  Laterality: N/A;     reports that he has never smoked. He has never used smokeless tobacco. He reports current alcohol use. He reports that he does not use drugs.  Allergies  Allergen Reactions  . Penicillins Anaphylaxis    Has patient had a PCN reaction causing immediate rash, facial/tongue/throat swelling, SOB or lightheadedness with hypotension: Yes Has patient had a PCN reaction causing severe rash involving mucus membranes or skin necrosis: No Has patient had a PCN reaction that required hospitalization Yes Has patient had a PCN reaction occurring within the last 10 years: No If all of the above answers are "NO", then may proceed with Cephalosporin use.  . Tavist [Clemastine] Swelling  . Loratadine     Patient states he is not aware of this allergy and request removal.     Family History  Problem Relation Age of Onset  . Diabetes Mother   . Heart disease Mother   . Cancer Father   . COPD Father      Prior to Admission medications   Medication Sig Start Date End Date Taking? Authorizing Provider  aspirin 81 MG tablet Take 1 tablet (81 mg total) by mouth daily. 02/23/18   Henreitta Leber, MD  atorvastatin (LIPITOR) 80 MG tablet Take 80 mg by mouth at bedtime. 12/14/15   [provider]  b complex vitamins capsule Take 1 capsule by mouth daily.    [provider]  clonazePAM (KLONOPIN) 1 MG tablet Take 0.5 mg by mouth at bedtime as needed for anxiety.  12/18/15   [provider]  clopidogrel (PLAVIX) 75 MG tablet Take 1 tablet (75 mg total) by mouth daily. 09/09/19   Kris Hartmann, NP  dicyclomine (BENTYL) 10 MG capsule Take 10 mg by  mouth 4 (four) times daily -  before meals and at bedtime. As needed for abd pain    [provider]  gabapentin (NEURONTIN) 400 MG capsule Take 400 mg by mouth 2 (two) times daily.    [provider]  Glucose Blood (ACCU-CHEK AVIVA PLUS VI) by In Vitro route.    [provider]  insulin detemir (LEVEMIR) 100 UNIT/ML injection Inject 15 Units into the skin daily. 03/04/19   [provider]  iron polysaccharides (NIFEREX) 150 MG capsule Take 150 mg by mouth daily.    [provider]  lisinopril-hydrochlorothiazide (PRINZIDE,ZESTORETIC) 20-12.5 MG per tablet Take 1 tablet by mouth daily.     [provider]  metFORMIN (GLUCOPHAGE-XR) 500 MG 24 hr tablet Take 1 tablet (500 mg total) by mouth 2 (two) times a day. Patient taking differently: Take 1,000 mg by mouth 2 (two) times a day.  03/24/19 07/02/19  Pyreddy,  Reatha Harps, MD  Omega-3 Fatty Acids (FISH OIL) 1000 MG CAPS Take 1 capsule by mouth daily.     [provider]  omeprazole (PRILOSEC) 40 MG capsule Take 40 mg by mouth daily.     [provider]  OZEMPIC, 0.25 OR 0.5 MG/DOSE, 2 MG/1.5ML SOPN Inject into the skin once a week. Inject 0.1875-0.375mLs (0.25-0.5mg  total) subcutaneously 03/04/19   [provider]  pantoprazole (PROTONIX) 40 MG tablet Take 40 mg by mouth daily.    [provider]  PARoxetine (PAXIL) 40 MG tablet Take 40 mg by mouth daily.     [provider]  pioglitazone (ACTOS) 15 MG tablet Take 15 mg by mouth daily.     [provider]  QUEtiapine (SEROQUEL) 100 MG tablet Take 100 mg by mouth at bedtime. 12/22/15   [provider]  topiramate (TOPAMAX) 100 MG tablet Take 100 mg by mouth daily.     [provider]  vitamin B-12 (CYANOCOBALAMIN) 1000 MCG tablet Take 1,000 mcg by mouth daily.    [provider]    Physical Exam: Vitals:   10/01/19 1913 10/01/19 2100 10/01/19 2101 10/01/19 2130  BP:  (!)  177/74 (!) 177/74   Pulse:  65  78  Resp:  17  12  Temp:      TempSrc:      SpO2:  99%  98%  Weight: 83.5 kg     Height: 5\' 5"  (1.651 m)       Constitutional: NAD, calm, comfortable, well appearing male laying flat in bed Vitals:   10/01/19 1913 10/01/19 2100 10/01/19 2101 10/01/19 2130  BP:  (!) 177/74 (!) 177/74   Pulse:  65  78  Resp:  17  12  Temp:      TempSrc:      SpO2:  99%  98%  Weight: 83.5 kg     Height: 5\' 5"  (1.651 m)      Eyes: PERRL, lids and conjunctivae normal ENMT: Mucous membranes are moist. Neck: normal, supple Respiratory: clear to auscultation bilaterally, no wheezing, no crackles. Normal respiratory effort.  Cardiovascular: Regular rate and rhythm, no murmurs / rubs / gallops. No extremity edema. Focal chest wall tenderness to left anterior chest- this is not aggravated with left shoulder range of motion. Abdomen: no tenderness, no masses palpated. . Bowel sounds positive.  Musculoskeletal: no clubbing / cyanosis. No joint deformity upper and lower extremities. Good ROM, no contractures. Normal muscle tone.  Pain to left posterior scapula with abduction and external rotation. Skin: no rashes, lesions, ulcers. No induration Neurologic: CN 2-12 grossly intact.  Patient with frequent restless movement of the legs and right hand.  Sensation intact. Strength 5/5 in all 4.  Psychiatric: Normal judgment and insight. Alert and oriented x 3. Normal mood.     Labs on Admission: I have personally reviewed following labs and imaging studies  CBC: Recent Labs  Lab 10/01/19 1918  WBC 8.9  NEUTROABS 6.5  HGB 8.0*  HCT 26.4*  MCV 81.2  PLT 0000000   Basic Metabolic Panel: Recent Labs  Lab 10/01/19 1918  NA 139  K 4.8  CL 106  CO2 24  GLUCOSE 108*  BUN 35*  CREATININE 1.62*  CALCIUM 8.7*   GFR: Estimated Creatinine Clearance: 39.8 mL/min (A) (by C-G formula based on SCr of 1.62 mg/dL (H)). Liver Function Tests: No results for input(s): AST, ALT,  ALKPHOS, BILITOT, PROT, ALBUMIN in the last 168 hours. No results for input(s): LIPASE,  AMYLASE in the last 168 hours. No results for input(s): AMMONIA in the last 168 hours. Coagulation Profile: No results for input(s): INR, PROTIME in the last 168 hours. Cardiac Enzymes: No results for input(s): CKTOTAL, CKMB, CKMBINDEX, TROPONINI in the last 168 hours. BNP (last 3 results) No results for input(s): PROBNP in the last 8760 hours. HbA1C: No results for input(s): HGBA1C in the last 72 hours. CBG: No results for input(s): GLUCAP in the last 168 hours. Lipid Profile: No results for input(s): CHOL, HDL, LDLCALC, TRIG, CHOLHDL, LDLDIRECT in the last 72 hours. Thyroid Function Tests: No results for input(s): TSH, T4TOTAL, FREET4, T3FREE, THYROIDAB in the last 72 hours. Anemia Panel: No results for input(s): VITAMINB12, FOLATE, FERRITIN, TIBC, IRON, RETICCTPCT in the last 72 hours. Urine analysis:    Component Value Date/Time   COLORURINE STRAW (A) 03/23/2019 1726   APPEARANCEUR CLEAR (A) 03/23/2019 1726   APPEARANCEUR Clear 11/23/2012 0629   LABSPEC 1.006 03/23/2019 1726   LABSPEC 1.018 11/23/2012 0629   PHURINE 5.0 03/23/2019 1726   GLUCOSEU NEGATIVE 03/23/2019 1726   GLUCOSEU Negative 11/23/2012 0629   HGBUR NEGATIVE 03/23/2019 1726   BILIRUBINUR NEGATIVE 03/23/2019 1726   BILIRUBINUR Negative 11/23/2012 0629   KETONESUR NEGATIVE 03/23/2019 1726   PROTEINUR NEGATIVE 03/23/2019 1726   NITRITE NEGATIVE 03/23/2019 1726   LEUKOCYTESUR NEGATIVE 03/23/2019 1726   LEUKOCYTESUR Negative 11/23/2012 0629    Radiological Exams on Admission: DG Chest 2 View  Result Date: 10/01/2019 CLINICAL DATA:  Chest pain EXAM: CHEST - 2 VIEW COMPARISON:  12/24/2018 FINDINGS: Mildly low lung volumes. No focal opacity or pleural effusion. Stable cardiomediastinal silhouette. No pneumothorax. IMPRESSION: No active cardiopulmonary disease.  Low lung volumes. Electronically Signed   By: Donavan Foil M.D.    On: 10/01/2019 20:13   CT Head Wo Contrast  Result Date: 10/01/2019 CLINICAL DATA:  [place] and hit head EXAM: CT HEAD WITHOUT CONTRAST CT CERVICAL SPINE WITHOUT CONTRAST TECHNIQUE: Multidetector CT imaging of the head and cervical spine was performed following the standard protocol without intravenous contrast. Multiplanar CT image reconstructions of the cervical spine were also generated. COMPARISON:  CT 12/24/2018, 11/05/2015 FINDINGS: CT HEAD FINDINGS Brain: No acute territorial infarction, hemorrhage, or intracranial mass. Mild atrophy. Normal ventricle size. Vascular: No hyperdense vessels.  Carotid vascular calcification Skull: Normal. Negative for fracture or focal lesion. Sinuses/Orbits: Mild mucosal thickening in the ethmoid sinuses Other: None CT CERVICAL SPINE FINDINGS Alignment: Straightening of the cervical spine. No subluxation. Facet alignment is normal Skull base and vertebrae: No acute fracture. No primary bone lesion or focal pathologic process. Soft tissues and spinal canal: No prevertebral fluid or swelling. No visible canal hematoma. Disc levels: Moderate severe degenerative changes at C5-C6, C6-C7 and C7 T1. Bilateral foraminal narrowing at these levels. Upper chest: Negative. Other: None IMPRESSION: 1. No CT evidence for acute intracranial abnormality.  Mild atrophy 2. Straightening of the cervical spine with degenerative changes. No acute osseous abnormality. Electronically Signed   By: Donavan Foil M.D.   On: 10/01/2019 20:10   CT Cervical Spine Wo Contrast  Result Date: 10/01/2019 CLINICAL DATA:  [place] and hit head EXAM: CT HEAD WITHOUT CONTRAST CT CERVICAL SPINE WITHOUT CONTRAST TECHNIQUE: Multidetector CT imaging of the head and cervical spine was performed following the standard protocol without intravenous contrast. Multiplanar CT image reconstructions of the cervical spine were also generated. COMPARISON:  CT 12/24/2018, 11/05/2015 FINDINGS: CT HEAD FINDINGS Brain: No acute  territorial infarction, hemorrhage, or intracranial mass. Mild atrophy. Normal ventricle size.  Vascular: No hyperdense vessels.  Carotid vascular calcification Skull: Normal. Negative for fracture or focal lesion. Sinuses/Orbits: Mild mucosal thickening in the ethmoid sinuses Other: None CT CERVICAL SPINE FINDINGS Alignment: Straightening of the cervical spine. No subluxation. Facet alignment is normal Skull base and vertebrae: No acute fracture. No primary bone lesion or focal pathologic process. Soft tissues and spinal canal: No prevertebral fluid or swelling. No visible canal hematoma. Disc levels: Moderate severe degenerative changes at C5-C6, C6-C7 and C7 T1. Bilateral foraminal narrowing at these levels. Upper chest: Negative. Other: None IMPRESSION: 1. No CT evidence for acute intracranial abnormality.  Mild atrophy 2. Straightening of the cervical spine with degenerative changes. No acute osseous abnormality. Electronically Signed   By: Donavan Foil M.D.   On: 10/01/2019 20:10   DG Shoulder Left  Result Date: 10/01/2019 CLINICAL DATA:  Chest pain shoulder pain EXAM: LEFT SHOULDER - 2+ VIEW COMPARISON:  None. FINDINGS: No fracture or malalignment. Mild calcific tendinitis. Mild AC joint degenerative change. IMPRESSION: No acute osseous abnormality Electronically Signed   By: Donavan Foil M.D.   On: 10/01/2019 20:15    EKG: Independently reviewed.   Assessment/Plan  Atypical chest pain chest pain Troponin of 24.  EKG without any ST or T wave changes. No significant CAD history but has risk factors. Continue to trend troponins SL nitro as needed As needed morphine Consult cardiology for stress testing in the morning. Will keep NPO after midnight.  Hypertension Elevated to 190 here Hold lisinopril-HCTZ due to AKI PRN labetalol for systolic [id number] and/or diastolic [id number]  Aortic arthrosclerosis Continue aspirin and Plavix  SMA stenosis s/p stent Asymptomatic.  Benign abdominal  exam.  Type 2 diabetes with neuropathy Normally on 15 units levermir BG 108 Start moderate sliding scale Continue topamax  Acute on CKD stage III Creatinine of 1.62 Monitor after fluids Avoid nephrotoxic agents   HLD Continue statin   Depression Continue Paxil, seroquel  Parkinsonism symptoms Continue topamax  OSA Not on CPAP  DVT prophylaxis:.Lovenox Code Status: Full Family Communication: Plan discussed with patient at bedside  disposition Plan: Home with at least 2 midnight stays  Consults called:  Admission status: inpatient  Korey Arroyo T Breandan People DO [facility]   If 7PM-7AM, please contact night-coverage [email or web address]   10/01/2019, 9:47 PM

## 2019-10-01 NOTE — ED Triage Notes (Signed)
Patient coming from work at the [employer] by [employer] for sudden onset of left sided chest pain. Per patient pain is like pressure. Patient had fell yesterday and hit head, patient having left sided shoulder pain from fall. Per EMS patient lethargic. CBG 118 68HR, 194/82, 98.7. 22 ga in left hand. Per EMS patient was given 324 aspirin.

## 2019-10-02 DIAGNOSIS — I129 Hypertensive chronic kidney disease with stage 1 through stage 4 chronic kidney disease, or unspecified chronic kidney disease: Secondary | ICD-10-CM | POA: Diagnosis present

## 2019-10-02 DIAGNOSIS — E1169 Type 2 diabetes mellitus with other specified complication: Secondary | ICD-10-CM | POA: Diagnosis present

## 2019-10-02 DIAGNOSIS — I200 Unstable angina: Secondary | ICD-10-CM | POA: Diagnosis not present

## 2019-10-02 DIAGNOSIS — E1142 Type 2 diabetes mellitus with diabetic polyneuropathy: Secondary | ICD-10-CM | POA: Diagnosis present

## 2019-10-02 DIAGNOSIS — E785 Hyperlipidemia, unspecified: Secondary | ICD-10-CM | POA: Diagnosis present

## 2019-10-02 DIAGNOSIS — I700 Atherosclerosis of aorta: Secondary | ICD-10-CM | POA: Diagnosis present

## 2019-10-02 DIAGNOSIS — K219 Gastro-esophageal reflux disease without esophagitis: Secondary | ICD-10-CM | POA: Diagnosis present

## 2019-10-02 DIAGNOSIS — N179 Acute kidney failure, unspecified: Secondary | ICD-10-CM | POA: Diagnosis present

## 2019-10-02 DIAGNOSIS — K5730 Diverticulosis of large intestine without perforation or abscess without bleeding: Secondary | ICD-10-CM | POA: Diagnosis present

## 2019-10-02 DIAGNOSIS — K2950 Unspecified chronic gastritis without bleeding: Secondary | ICD-10-CM | POA: Diagnosis present

## 2019-10-02 DIAGNOSIS — D6489 Other specified anemias: Secondary | ICD-10-CM | POA: Diagnosis not present

## 2019-10-02 DIAGNOSIS — R079 Chest pain, unspecified: Secondary | ICD-10-CM | POA: Diagnosis present

## 2019-10-02 DIAGNOSIS — Z20822 Contact with and (suspected) exposure to covid-19: Secondary | ICD-10-CM | POA: Diagnosis present

## 2019-10-02 DIAGNOSIS — N289 Disorder of kidney and ureter, unspecified: Secondary | ICD-10-CM | POA: Diagnosis not present

## 2019-10-02 DIAGNOSIS — K551 Chronic vascular disorders of intestine: Secondary | ICD-10-CM | POA: Diagnosis present

## 2019-10-02 DIAGNOSIS — M25512 Pain in left shoulder: Secondary | ICD-10-CM | POA: Diagnosis present

## 2019-10-02 DIAGNOSIS — F419 Anxiety disorder, unspecified: Secondary | ICD-10-CM | POA: Diagnosis present

## 2019-10-02 DIAGNOSIS — N183 Chronic kidney disease, stage 3 unspecified: Secondary | ICD-10-CM | POA: Diagnosis present

## 2019-10-02 DIAGNOSIS — F325 Major depressive disorder, single episode, in full remission: Secondary | ICD-10-CM | POA: Diagnosis present

## 2019-10-02 DIAGNOSIS — K449 Diaphragmatic hernia without obstruction or gangrene: Secondary | ICD-10-CM | POA: Diagnosis present

## 2019-10-02 DIAGNOSIS — D500 Iron deficiency anemia secondary to blood loss (chronic): Secondary | ICD-10-CM | POA: Diagnosis not present

## 2019-10-02 DIAGNOSIS — G4733 Obstructive sleep apnea (adult) (pediatric): Secondary | ICD-10-CM | POA: Diagnosis present

## 2019-10-02 DIAGNOSIS — I25110 Atherosclerotic heart disease of native coronary artery with unstable angina pectoris: Secondary | ICD-10-CM | POA: Diagnosis present

## 2019-10-02 DIAGNOSIS — E1122 Type 2 diabetes mellitus with diabetic chronic kidney disease: Secondary | ICD-10-CM | POA: Diagnosis present

## 2019-10-02 DIAGNOSIS — D62 Acute posthemorrhagic anemia: Secondary | ICD-10-CM | POA: Diagnosis not present

## 2019-10-02 DIAGNOSIS — D509 Iron deficiency anemia, unspecified: Secondary | ICD-10-CM | POA: Diagnosis present

## 2019-10-02 DIAGNOSIS — G20 Parkinson's disease: Secondary | ICD-10-CM | POA: Diagnosis present

## 2019-10-02 DIAGNOSIS — Z955 Presence of coronary angioplasty implant and graft: Secondary | ICD-10-CM | POA: Diagnosis not present

## 2019-10-02 DIAGNOSIS — D631 Anemia in chronic kidney disease: Secondary | ICD-10-CM | POA: Diagnosis present

## 2019-10-02 LAB — HEMOGLOBIN A1C
Hgb A1c MFr Bld: 7.8 % — ABNORMAL HIGH (ref 4.8–5.6)
Mean Plasma Glucose: 177.16 mg/dL

## 2019-10-02 LAB — GLUCOSE, CAPILLARY
Glucose-Capillary: 188 mg/dL — ABNORMAL HIGH (ref 70–99)
Glucose-Capillary: 162 mg/dL — ABNORMAL HIGH (ref 70–99)
Glucose-Capillary: 115 mg/dL — ABNORMAL HIGH (ref 70–99)
Glucose-Capillary: 192 mg/dL — ABNORMAL HIGH (ref 70–99)

## 2019-10-02 LAB — PROTIME-INR
INR: 1.1 (ref 0.8–1.2)
Prothrombin Time: 13.6 seconds (ref 11.4–15.2)

## 2019-10-02 LAB — SARS CORONAVIRUS 2 (TAT 6-24 HRS): SARS Coronavirus 2: NEGATIVE

## 2019-10-02 LAB — TROPONIN I (HIGH SENSITIVITY): Troponin I (High Sensitivity): 21 ng/L — ABNORMAL HIGH (ref <18)

## 2019-10-02 LAB — APTT: aPTT: 34 seconds (ref 24–36)

## 2019-10-02 LAB — HEPARIN LEVEL (UNFRACTIONATED): Heparin Unfractionated: 0.35 IU/mL (ref 0.30–0.70)

## 2019-10-02 MED ORDER — HYDRALAZINE HCL 25 MG PO TABS: 25.0000 mg | ORAL_TABLET | Freq: Four times a day (QID) | ORAL | Status: DC | PRN

## 2019-10-02 MED ORDER — HEPARIN (PORCINE) 25000 UT/250ML-% IV SOLN
950.0000 [IU]/h | INTRAVENOUS | Status: AC
  Administered 2019-10-02: 950 [IU]/h via INTRAVENOUS
  Filled 2019-10-02: qty 250

## 2019-10-02 MED ORDER — SODIUM CHLORIDE 0.9% FLUSH
3.0000 mL | Freq: Two times a day (BID) | INTRAVENOUS | Status: DC
  Administered 2019-10-02 (×2): 3 mL via INTRAVENOUS

## 2019-10-02 NOTE — Consult Note (Signed)
Patrick Turner is a 74 y.o. male  [id number]  Primary Cardiologist: Neoma Laming Reason for Consultation: Acute coronary syndrome  HPI: This 74 year old white male who presented to the hospital via EMS because of chest pain.  Patient states that he started having pressure type chest pain in the retrosternal area starting yesterday while he was at work and got progressively worse thus he called EMS and presented to the hospital.  He had another episode 20 minutes ago at rest with similar in character.   Review of Systems: No orthopnea PND but does have some shortness of breath   Past Medical History:  Diagnosis Date  . Altered mental status   . Anemia   . Anxiety   . Aortic atherosclerosis ([place])   . Arthritis   . Coronary artery disease   . CRD (chronic renal disease)   . Depression   . Diabetes mellitus without complication ([place])   . Encephalopathy acute   . GERD (gastroesophageal reflux disease)   . Headache   . Hypercholesteremia   . Hypertension   . Neuropathy   . Severe obesity ([place])   . Sleep apnea   . Sleep terror    per patient this year per patient     (Not in a hospital admission)    . aspirin  324 mg Oral Once  . aspirin EC  81 mg Oral Daily  . atorvastatin  80 mg Oral q1800  . clopidogrel  75 mg Oral Daily  . enoxaparin (LOVENOX) injection  40 mg Subcutaneous Q24H  . gabapentin  400 mg Oral QHS  . insulin aspart  0-15 Units Subcutaneous TID WC  . iron polysaccharides  150 mg Oral Daily  . pantoprazole  40 mg Oral Daily  . PARoxetine  40 mg Oral Daily  . QUEtiapine  100 mg Oral QHS  . topiramate  100 mg Oral Daily  . vitamin B-12  1,000 mcg Oral Daily    Infusions:   Allergies  Allergen Reactions  . Penicillins Anaphylaxis    Has patient had a PCN reaction causing immediate rash, facial/tongue/throat swelling, SOB or lightheadedness with hypotension: Yes Has patient had a PCN reaction causing severe rash involving mucus membranes or skin  necrosis: No Has patient had a PCN reaction that required hospitalization Yes Has patient had a PCN reaction occurring within the last 10 years: No If all of the above answers are "NO", then may proceed with Cephalosporin use.  . Tavist [Clemastine] Swelling  . Loratadine     Patient states he is not aware of this allergy and request removal.     Social History   Socioeconomic History  . Marital status: Married    Spouse name: Patrick Turner  . Number of children: 2  . Years of education: Not on file  . Highest education level: Associate degree: academic program  Occupational History  . Occupation: works at [employer]  Tobacco Use  . Smoking status: Never Smoker  . Smokeless tobacco: Never Used  Substance and Sexual Activity  . Alcohol use: Yes    Comment: rare  . Drug use: No  . Sexual activity: Not on file  Other Topics Concern  . Not on file  Social History Narrative  . Not on file   Social Determinants of Health   Financial Resource Strain: Low Risk   . Difficulty of Paying Living Expenses: Not hard at all  Food Insecurity: No Food Insecurity  . Worried About [employer] of  Food in the Last Year: Never true  . Ran Out of Food in the Last Year: Never true  Transportation Needs: No Transportation Needs  . Lack of Transportation (Medical): No  . Lack of Transportation (Non-Medical): No  Physical Activity: Unknown  . Days of Exercise per Week: 0 days  . Minutes of Exercise per Session: Not on file  Stress: No Stress Concern Present  . Feeling of Stress : Only a little  Social Connections: Unknown  . Frequency of Communication with Friends and Family: More than three times a week  . Frequency of Social Gatherings with Friends and Family: Not on file  . Attends Religious Services: 1 to 4 times per year  . Active Member of Clubs or Organizations: Not on file  . Attends Archivist Meetings: Not on file  . Marital Status: Married  Human resources officer Violence: Not  At Risk  . Fear of Current or Ex-Partner: No  . Emotionally Abused: No  . Physically Abused: No  . Sexually Abused: No    Family History  Problem Relation Age of Onset  . Diabetes Mother   . Heart disease Mother   . Cancer Father   . COPD Father     PHYSICAL EXAM: Vitals:   10/02/19 0830 10/02/19 0900  BP: (!) 148/62 (!) 148/66  Pulse: 62 61  Resp: 14 19  Temp:    SpO2: 99% 97%     Intake/Output Summary (Last 24 hours) at 10/02/2019 1041 Last data filed at 10/02/2019 0835 Gross per 24 hour  Intake 500 ml  Output 1575 ml  Net -1075 ml    General:  Well appearing. No respiratory difficulty HEENT: normal Neck: supple. no JVD. Carotids 2+ bilat; no bruits. No lymphadenopathy or thryomegaly appreciated. Cor: PMI nondisplaced. Regular rate & rhythm. No rubs, gallops or murmurs. Lungs: clear Abdomen: soft, nontender, nondistended. No hepatosplenomegaly. No bruits or masses. Good bowel sounds. Extremities: no cyanosis, clubbing, rash, edema Neuro: alert & oriented x 3, cranial nerves grossly intact. moves all 4 extremities w/o difficulty. Affect pleasant.  ECG: Sinus bradycardia with occasional PVCs nonspecific ST-T changes  Results for orders placed or performed during the hospital encounter of 10/01/19 (from the past 24 hour(s))  Basic metabolic panel     Status: Abnormal   Collection Time: 10/01/19  7:18 PM  Result Value Ref Range   Sodium 139 135 - 145 mmol/L   Potassium 4.8 3.5 - 5.1 mmol/L   Chloride 106 98 - 111 mmol/L   CO2 24 22 - 32 mmol/L   Glucose, Bld 108 (H) 70 - 99 mg/dL   BUN 35 (H) 8 - 23 mg/dL   Creatinine, Ser 1.62 (H) 0.61 - 1.24 mg/dL   Calcium 8.7 (L) 8.9 - 10.3 mg/dL   GFR calc non Af Amer 41 (L) >60 mL/min   GFR calc Af Amer 48 (L) >60 mL/min   Anion gap 9 5 - 15  CBC with Differential     Status: Abnormal   Collection Time: 10/01/19  7:18 PM  Result Value Ref Range   WBC 8.9 4.0 - 10.5 K/uL   RBC 3.25 (L) 4.22 - 5.81 MIL/uL   Hemoglobin  8.0 (L) 13.0 - 17.0 g/dL   HCT 26.4 (L) 39.0 - 52.0 %   MCV 81.2 80.0 - 100.0 fL   MCH 24.6 (L) 26.0 - 34.0 pg   MCHC 30.3 30.0 - 36.0 g/dL   RDW 16.3 (H) 11.5 - 15.5 %   Platelets  254 150 - 400 K/uL   nRBC 0.0 0.0 - 0.2 %   Neutrophils Relative % 74 %   Neutro Abs 6.5 1.7 - 7.7 K/uL   Lymphocytes Relative 15 %   Lymphs Abs 1.3 0.7 - 4.0 K/uL   Monocytes Relative 9 %   Monocytes Absolute 0.8 0.1 - 1.0 K/uL   Eosinophils Relative 2 %   Eosinophils Absolute 0.2 0.0 - 0.5 K/uL   Basophils Relative 0 %   Basophils Absolute 0.0 0.0 - 0.1 K/uL   Immature Granulocytes 0 %   Abs Immature Granulocytes 0.04 0.00 - 0.07 K/uL  Troponin I (High Sensitivity)     Status: Abnormal   Collection Time: 10/01/19  7:18 PM  Result Value Ref Range   Troponin I (High Sensitivity) 24 (H) <18 ng/L  Troponin I (High Sensitivity)     Status: Abnormal   Collection Time: 10/01/19  9:15 PM  Result Value Ref Range   Troponin I (High Sensitivity) 21 (H) <18 ng/L  SARS CORONAVIRUS 2 (TAT 6-24 HRS) Nasopharyngeal Nasopharyngeal Swab     Status: None   Collection Time: 10/01/19 10:57 PM   Specimen: Nasopharyngeal Swab  Result Value Ref Range   SARS Coronavirus 2 NEGATIVE NEGATIVE  Troponin I (High Sensitivity)     Status: Abnormal   Collection Time: 10/01/19 11:39 PM  Result Value Ref Range   Troponin I (High Sensitivity) 21 (H) <18 ng/L  Hemoglobin A1c     Status: Abnormal   Collection Time: 10/01/19 11:39 PM  Result Value Ref Range   Hgb A1c MFr Bld 7.8 (H) 4.8 - 5.6 %   Mean Plasma Glucose 177.16 mg/dL  Glucose, capillary     Status: None   Collection Time: 10/01/19 11:57 PM  Result Value Ref Range   Glucose-Capillary 87 70 - 99 mg/dL  Glucose, capillary     Status: Abnormal   Collection Time: 10/02/19  7:21 AM  Result Value Ref Range   Glucose-Capillary 188 (H) 70 - 99 mg/dL   DG Chest 2 View  Result Date: 10/01/2019 CLINICAL DATA:  Chest pain EXAM: CHEST - 2 VIEW COMPARISON:  12/24/2018  FINDINGS: Mildly low lung volumes. No focal opacity or pleural effusion. Stable cardiomediastinal silhouette. No pneumothorax. IMPRESSION: No active cardiopulmonary disease.  Low lung volumes. Electronically Signed   By: Donavan Foil M.D.   On: 10/01/2019 20:13   CT Head Wo Contrast  Result Date: 10/01/2019 CLINICAL DATA:  [place] and hit head EXAM: CT HEAD WITHOUT CONTRAST CT CERVICAL SPINE WITHOUT CONTRAST TECHNIQUE: Multidetector CT imaging of the head and cervical spine was performed following the standard protocol without intravenous contrast. Multiplanar CT image reconstructions of the cervical spine were also generated. COMPARISON:  CT 12/24/2018, 11/05/2015 FINDINGS: CT HEAD FINDINGS Brain: No acute territorial infarction, hemorrhage, or intracranial mass. Mild atrophy. Normal ventricle size. Vascular: No hyperdense vessels.  Carotid vascular calcification Skull: Normal. Negative for fracture or focal lesion. Sinuses/Orbits: Mild mucosal thickening in the ethmoid sinuses Other: None CT CERVICAL SPINE FINDINGS Alignment: Straightening of the cervical spine. No subluxation. Facet alignment is normal Skull base and vertebrae: No acute fracture. No primary bone lesion or focal pathologic process. Soft tissues and spinal canal: No prevertebral fluid or swelling. No visible canal hematoma. Disc levels: Moderate severe degenerative changes at C5-C6, C6-C7 and C7 T1. Bilateral foraminal narrowing at these levels. Upper chest: Negative. Other: None IMPRESSION: 1. No CT evidence for acute intracranial abnormality.  Mild atrophy 2. Straightening of the cervical  spine with degenerative changes. No acute osseous abnormality. Electronically Signed   By: Donavan Foil M.D.   On: 10/01/2019 20:10   CT Cervical Spine Wo Contrast  Result Date: 10/01/2019 CLINICAL DATA:  [place] and hit head EXAM: CT HEAD WITHOUT CONTRAST CT CERVICAL SPINE WITHOUT CONTRAST TECHNIQUE: Multidetector CT imaging of the head and cervical spine  was performed following the standard protocol without intravenous contrast. Multiplanar CT image reconstructions of the cervical spine were also generated. COMPARISON:  CT 12/24/2018, 11/05/2015 FINDINGS: CT HEAD FINDINGS Brain: No acute territorial infarction, hemorrhage, or intracranial mass. Mild atrophy. Normal ventricle size. Vascular: No hyperdense vessels.  Carotid vascular calcification Skull: Normal. Negative for fracture or focal lesion. Sinuses/Orbits: Mild mucosal thickening in the ethmoid sinuses Other: None CT CERVICAL SPINE FINDINGS Alignment: Straightening of the cervical spine. No subluxation. Facet alignment is normal Skull base and vertebrae: No acute fracture. No primary bone lesion or focal pathologic process. Soft tissues and spinal canal: No prevertebral fluid or swelling. No visible canal hematoma. Disc levels: Moderate severe degenerative changes at C5-C6, C6-C7 and C7 T1. Bilateral foraminal narrowing at these levels. Upper chest: Negative. Other: None IMPRESSION: 1. No CT evidence for acute intracranial abnormality.  Mild atrophy 2. Straightening of the cervical spine with degenerative changes. No acute osseous abnormality. Electronically Signed   By: Donavan Foil M.D.   On: 10/01/2019 20:10   DG Shoulder Left  Result Date: 10/01/2019 CLINICAL DATA:  Chest pain shoulder pain EXAM: LEFT SHOULDER - 2+ VIEW COMPARISON:  None. FINDINGS: No fracture or malalignment. Mild calcific tendinitis. Mild AC joint degenerative change. IMPRESSION: No acute osseous abnormality Electronically Signed   By: Donavan Foil M.D.   On: 10/01/2019 20:15     ASSESSMENT AND PLAN: Unstable angina with multiple risk factors for coronary artery disease and chest pain at rest.  Patient was explained risk and benefits and agreed to proceeding with cardiac catheterization tomorrow as had breakfast this morning.  Avilyn Virtue A

## 2019-10-02 NOTE — Consult Note (Signed)
ANTICOAGULATION CONSULT NOTE - Initial Consult  Pharmacy Consult for Heparin Indication: chest pain/ACS  Allergies  Allergen Reactions  . Penicillins Anaphylaxis    Has patient had a PCN reaction causing immediate rash, facial/tongue/throat swelling, SOB or lightheadedness with hypotension: Yes Has patient had a PCN reaction causing severe rash involving mucus membranes or skin necrosis: No Has patient had a PCN reaction that required hospitalization Yes Has patient had a PCN reaction occurring within the last 10 years: No If all of the above answers are "NO", then may proceed with Cephalosporin use.  . Tavist [Clemastine] Swelling  . Loratadine     Patient states he is not aware of this allergy and request removal.     Patient Measurements: Height: 5\' 5"  (165.1 cm) Weight: 189 lb 11.2 oz (86 kg) IBW/kg (Calculated) : 61.5 Heparin Dosing Weight: 79.6 kg  Vital Signs: Temp: 98.3 F (36.8 C) (01/20 1936) Temp Source: Oral (01/20 1936) BP: 140/56 (01/20 1936) Pulse Rate: 75 (01/20 1936)  Labs: Recent Labs    10/01/19 1918 10/01/19 2115 10/01/19 2339 10/02/19 1316 10/02/19 2059  HGB 8.0*  --   --   --   --   HCT 26.4*  --   --   --   --   PLT 254  --   --   --   --   APTT  --   --   --  34  --   LABPROT  --   --   --  13.6  --   INR  --   --   --  1.1  --   HEPARINUNFRC  --   --   --   --  0.35  CREATININE 1.62*  --   --   --   --   TROPONINIHS 24* 21* 21*  --   --     Estimated Creatinine Clearance: 40.3 mL/min (A) (by C-G formula based on SCr of 1.62 mg/dL (H)).   Medical History: Past Medical History:  Diagnosis Date  . Altered mental status   . Anemia   . Anxiety   . Aortic atherosclerosis ([place])   . Arthritis   . Coronary artery disease   . CRD (chronic renal disease)   . Depression   . Diabetes mellitus without complication ([place])   . Encephalopathy acute   . GERD (gastroesophageal reflux disease)   . Headache   . Hypercholesteremia   .  Hypertension   . Neuropathy   . Severe obesity ([place])   . Sleep apnea   . Sleep terror    per patient this year per patient     Medications:  Medications Prior to Admission  Medication Sig Dispense Refill Last Dose  . aspirin 81 MG tablet Take 1 tablet (81 mg total) by mouth daily.   09/30/2019 at Unknown time  . atorvastatin (LIPITOR) 80 MG tablet Take 80 mg by mouth at bedtime.   09/30/2019 at 2100  . b complex vitamins capsule Take 1 capsule by mouth daily.   09/30/2019 at 0800  . clonazePAM (KLONOPIN) 1 MG tablet Take 0.5 mg by mouth at bedtime as needed for anxiety.   2 Unknown at PRN  . clopidogrel (PLAVIX) 75 MG tablet Take 1 tablet (75 mg total) by mouth daily. 30 tablet 11 09/30/2019 at Unknown time  . gabapentin (NEURONTIN) 400 MG capsule Take 400 mg by mouth daily.    09/30/2019 at Unknown time  . insulin detemir (LEVEMIR) 100 UNIT/ML injection Inject  15 Units into the skin daily.   09/30/2019 at Unknown time  . iron polysaccharides (NIFEREX) 150 MG capsule Take 150 mg by mouth daily.   09/30/2019 at Unknown time  . lisinopril-hydrochlorothiazide (PRINZIDE,ZESTORETIC) 20-12.5 MG per tablet Take 1 tablet by mouth daily.    09/30/2019 at Unknown time  . metFORMIN (GLUCOPHAGE-XR) 500 MG 24 hr tablet Take 1,000 mg by mouth 2 (two) times daily with a meal.   09/30/2019 at 1700  . Omega-3 Fatty Acids (FISH OIL) 1000 MG CAPS Take 1,000 mg by mouth daily.      Marland Kitchen OZEMPIC, 0.25 OR 0.5 MG/DOSE, 2 MG/1.5ML SOPN Inject 1 Dose into the skin once a week.    Past Week at Unknown time  . pantoprazole (PROTONIX) 40 MG tablet Take 40 mg by mouth daily.   09/30/2019 at Unknown time  . PARoxetine (PAXIL) 40 MG tablet Take 40 mg by mouth daily.    09/30/2019 at 0800  . pioglitazone (ACTOS) 15 MG tablet Take 15 mg by mouth daily.    09/30/2019 at 0800  . QUEtiapine (SEROQUEL) 100 MG tablet Take 100 mg by mouth at bedtime.   09/30/2019 at 2100  . topiramate (TOPAMAX) 100 MG tablet Take 100 mg by mouth daily.     09/30/2019 at 0800  . vitamin B-12 (CYANOCOBALAMIN) 1000 MCG tablet Take 1,000 mcg by mouth daily.      Scheduled:  . aspirin EC  81 mg Oral Daily  . atorvastatin  80 mg Oral q1800  . clopidogrel  75 mg Oral Daily  . gabapentin  400 mg Oral QHS  . insulin aspart  0-15 Units Subcutaneous TID WC  . iron polysaccharides  150 mg Oral Daily  . pantoprazole  40 mg Oral Daily  . PARoxetine  40 mg Oral Daily  . QUEtiapine  100 mg Oral QHS  . sodium chloride flush  3 mL Intravenous Q12H  . topiramate  100 mg Oral Daily  . vitamin B-12  1,000 mcg Oral Daily   Infusions:  . heparin 950 Units/hr (10/02/19 1323)   PRN: acetaminophen, clonazePAM, hydrALAZINE, labetalol, morphine injection, ondansetron (ZOFRAN) IV Anti-infectives (From admission, onward)   None      Assessment: Patient was order a heparin infusion. Pharmacy will monitor heparin infusion.  Goal of Therapy:  Heparin level 0.3-0.7 units/ml Monitor platelets by anticoagulation protocol: Yes   Plan:  Start heparin infusion at 950 units/hr Check anti-Xa level in 8 hours and daily while on heparin Continue to monitor H&H and platelets  1/20:  HL @ 2059 = 0.35 Will continue pt on current rate and recheck HL in 8 hrs on 1/21 @ 0500.   Alessandria Henken D, PharmD 10/02/2019,9:55 PM

## 2019-10-02 NOTE — ED Notes (Signed)
This RN answered call bell. Pt provided with urinal and a warm blanket at this time. This RN will continue to monitor pt.

## 2019-10-02 NOTE — Consult Note (Signed)
ANTICOAGULATION CONSULT NOTE - Initial Consult  Pharmacy Consult for Heparin Indication: chest pain/ACS  Allergies  Allergen Reactions  . Penicillins Anaphylaxis    Has patient had a PCN reaction causing immediate rash, facial/tongue/throat swelling, SOB or lightheadedness with hypotension: Yes Has patient had a PCN reaction causing severe rash involving mucus membranes or skin necrosis: No Has patient had a PCN reaction that required hospitalization Yes Has patient had a PCN reaction occurring within the last 10 years: No If all of the above answers are "NO", then may proceed with Cephalosporin use.  . Tavist [Clemastine] Swelling  . Loratadine     Patient states he is not aware of this allergy and request removal.     Patient Measurements: Height: 5\' 5"  (165.1 cm) Weight: 189 lb 11.2 oz (86 kg) IBW/kg (Calculated) : 61.5 Heparin Dosing Weight: 79.6 kg  Vital Signs: Temp: 97.7 F (36.5 C) (01/20 1118) Temp Source: Oral (01/20 1118) BP: 151/61 (01/20 1118) Pulse Rate: 63 (01/20 1118)  Labs: Recent Labs    10/01/19 1918 10/01/19 2115 10/01/19 2339  HGB 8.0*  --   --   HCT 26.4*  --   --   PLT 254  --   --   CREATININE 1.62*  --   --   TROPONINIHS 24* 21* 21*    Estimated Creatinine Clearance: 40.3 mL/min (A) (by C-G formula based on SCr of 1.62 mg/dL (H)).   Medical History: Past Medical History:  Diagnosis Date  . Altered mental status   . Anemia   . Anxiety   . Aortic atherosclerosis ([place])   . Arthritis   . Coronary artery disease   . CRD (chronic renal disease)   . Depression   . Diabetes mellitus without complication ([place])   . Encephalopathy acute   . GERD (gastroesophageal reflux disease)   . Headache   . Hypercholesteremia   . Hypertension   . Neuropathy   . Severe obesity ([place])   . Sleep apnea   . Sleep terror    per patient this year per patient     Medications:  Medications Prior to Admission  Medication Sig Dispense Refill Last Dose   . aspirin 81 MG tablet Take 1 tablet (81 mg total) by mouth daily.   09/30/2019 at Unknown time  . atorvastatin (LIPITOR) 80 MG tablet Take 80 mg by mouth at bedtime.   09/30/2019 at 2100  . b complex vitamins capsule Take 1 capsule by mouth daily.   09/30/2019 at 0800  . clonazePAM (KLONOPIN) 1 MG tablet Take 0.5 mg by mouth at bedtime as needed for anxiety.   2 Unknown at PRN  . clopidogrel (PLAVIX) 75 MG tablet Take 1 tablet (75 mg total) by mouth daily. 30 tablet 11 09/30/2019 at Unknown time  . gabapentin (NEURONTIN) 400 MG capsule Take 400 mg by mouth daily.    09/30/2019 at Unknown time  . insulin detemir (LEVEMIR) 100 UNIT/ML injection Inject 15 Units into the skin daily.   09/30/2019 at Unknown time  . iron polysaccharides (NIFEREX) 150 MG capsule Take 150 mg by mouth daily.   09/30/2019 at Unknown time  . lisinopril-hydrochlorothiazide (PRINZIDE,ZESTORETIC) 20-12.5 MG per tablet Take 1 tablet by mouth daily.    09/30/2019 at Unknown time  . metFORMIN (GLUCOPHAGE-XR) 500 MG 24 hr tablet Take 1,000 mg by mouth 2 (two) times daily with a meal.   09/30/2019 at 1700  . Omega-3 Fatty Acids (FISH OIL) 1000 MG CAPS Take 1,000 mg by mouth  daily.      Marland Kitchen OZEMPIC, 0.25 OR 0.5 MG/DOSE, 2 MG/1.5ML SOPN Inject 1 Dose into the skin once a week.    Past Week at Unknown time  . pantoprazole (PROTONIX) 40 MG tablet Take 40 mg by mouth daily.   09/30/2019 at Unknown time  . PARoxetine (PAXIL) 40 MG tablet Take 40 mg by mouth daily.    09/30/2019 at 0800  . pioglitazone (ACTOS) 15 MG tablet Take 15 mg by mouth daily.    09/30/2019 at 0800  . QUEtiapine (SEROQUEL) 100 MG tablet Take 100 mg by mouth at bedtime.   09/30/2019 at 2100  . topiramate (TOPAMAX) 100 MG tablet Take 100 mg by mouth daily.    09/30/2019 at 0800  . vitamin B-12 (CYANOCOBALAMIN) 1000 MCG tablet Take 1,000 mcg by mouth daily.      Scheduled:  . aspirin EC  81 mg Oral Daily  . atorvastatin  80 mg Oral q1800  . clopidogrel  75 mg Oral Daily  .  gabapentin  400 mg Oral QHS  . insulin aspart  0-15 Units Subcutaneous TID WC  . iron polysaccharides  150 mg Oral Daily  . pantoprazole  40 mg Oral Daily  . PARoxetine  40 mg Oral Daily  . QUEtiapine  100 mg Oral QHS  . sodium chloride flush  3 mL Intravenous Q12H  . topiramate  100 mg Oral Daily  . vitamin B-12  1,000 mcg Oral Daily   Infusions:  . heparin     PRN: acetaminophen, clonazePAM, labetalol, morphine injection, ondansetron (ZOFRAN) IV Anti-infectives (From admission, onward)   None      Assessment: Patient was order a heparin infusion. Pharmacy will monitor heparin infusion.  Goal of Therapy:  Heparin level 0.3-0.7 units/ml Monitor platelets by anticoagulation protocol: Yes   Plan:  Start heparin infusion at 950 units/hr Check anti-Xa level in 8 hours and daily while on heparin Continue to monitor H&H and platelets  Oswald Hillock, PharmD, BCPS 10/02/2019,12:57 PM

## 2019-10-02 NOTE — ED Notes (Addendum)
Pt c/o left sided CP, indicates it feel like pressure. Pt A/Ox4. NSR at 67 on cardiac monitor. Pt denies SHOB/dizziness/Nausea at this time. EKG.  PRN nitro provided to pt.

## 2019-10-02 NOTE — Progress Notes (Signed)
PROGRESS NOTE    Patrick Turner  [id number] DOB: 18-Jun-1946 DOA: 10/01/2019  PCP: Kirk Ruths, MD    LOS - 0   Brief Narrative:  74 y.o. 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  Left-sided, associated dizziness.  Chest pain resolved in the ED after getting 4mg  of morphine, aspirin and SQ nitro.  Troponin of 24. Sinus EKG without any ST or T wave changes.  Admitted to hospitalist service with cardiology consult for further evaluation.   Subjective 1/20: Patient seen this AM.  Continues to have some chest pain but not as severe, 2 out of 10 currently.  Also with left shoulder pain which is from his fall.  States he's hungry due to being NPO.  Denies fever/chills, SOB, N/V/D or other acute complaints.  Assessment & Plan:   Principal Problem:   Chest pain Active Problems:   Unstable angina (HCC)   Acute on chronic renal insufficiency   HTN (hypertension), benign   Chronic mesenteric ischemia (HCC)   HLD (hyperlipidemia)   Aortic atherosclerosis (HCC)   Depression, major, in remission (HCC)   OSA (obstructive sleep apnea)   Atypical chest pain chest pain, likely due to Unstable Angina Troponin of 24->21->21.  EKG without any ST or T wave changes. No significant CAD history but has risk factors including, notably, chronic mesenteric ischemia and atherosclerosis.   --cardiology consulted  (Dr. Humphrey Rolls), going for cath in the AM --heparin gtt --cont ASA, Plavix, Lipitor --SL nitro as needed --As needed morphine --NPO after midnight  Hypertension - uncontrolled on admission, improved.  Hold lisinopril-HCTZ due to AKI PRN hydralazine and/or labetalol as needed  Aortic arthrosclerosis Continue DAPT as above.  SMA stenosis s/p stent Asymptomatic.  Benign  abdominal exam.  Continue DAPT.  Type 2 diabetes with neuropathy Normally on 15 units levermir --moderate sliding scale Novolog --Continue topamax  Acute on CKD stage III Creatinine of 1.62 Monitor after fluids Avoid nephrotoxic agents   HLD Continue statin   Depression Continue Paxil, seroquel  Parkinsonism symptoms Continue topamax  OSA Not on CPAP   DVT prophylaxis: on heparin gtt   Code Status: Full Code  Family Communication: none at bedside  Disposition Plan:  Pending cath tomorrow and cardiology recommendations.  Expect d/c home.  PT eval pending.   Severity of Illness: The appropriate patient status for this patient is INPATIENT. Inpatient status is judged to be reasonable and necessary in order to provide the required intensity of service to ensure the patient's safety and to prevent further morbidity/mortality.  The patient's presenting symptoms, and initial radiographic and laboratory data in the context of their chronic comorbidities is felt to place them at high risk for further clinical deterioration. Furthermore, it is not anticipated that the patient will be medically stable for discharge from the hospital within 2 midnights of admission. The following factors support the patient status of inpatient.    "           The patient's presenting symptoms include chest pain. "           The initial radiographic and laboratory data are worrisome because of elevated troponins. "           The chronic co-morbidities include multiple vascular comorbidities including chronic mesenteric ischemia, atherosclerosis, hypertension, in addition to  diabetes, and chronic kidney disease.     * I certify that at the point of admission it is my clinical judgment that the patient will require inpatient hospital care spanning beyond 2 midnights from the point of admission due to high intensity of service, high risk for further deterioration and high frequency of surveillance  required.*   Consultants:   Cardiology, Dr. Humphrey Rolls  Procedures:   none  Antimicrobials:   none    Objective: Vitals:   10/02/19 0900 10/02/19 1118 10/02/19 1118 10/02/19 1605  BP: (!) 148/66  (!) 151/61 (!) 153/64  Pulse: 61  63 67  Resp: 19  18 18   Temp:   97.7 F (36.5 C)   TempSrc:   Oral Oral  SpO2: 97%  100% 98%  Weight:  86 kg    Height:  5\' 5"  (1.651 m)      Intake/Output Summary (Last 24 hours) at 10/02/2019 1711 Last data filed at 10/02/2019 0835 Gross per 24 hour  Intake 500 ml  Output 1575 ml  Net -1075 ml   Filed Weights   10/01/19 1913 10/02/19 1118  Weight: 83.5 kg 86 kg    Examination:  General exam: awake, alert, no acute distress HEENT: moist mucus membranes, hearing grossly normal  Respiratory system: clear to auscultation bilaterally, no wheezes, rales or rhonchi, normal respiratory effort. Cardiovascular system: normal S1/S2, RRR, no JVD, murmurs, rubs, gallops, no pedal edema.   Gastrointestinal system: soft, non-tender, non-distended, normal bowel sounds. Central nervous system: alert and oriented x3. no gross focal neurologic deficits, normal speech Extremities: moves all, no edema, normal tone Skin: dry, intact, normal temperature Psychiatry: normal mood, congruent affect, judgement and insight appear normal    Data Reviewed: I have personally reviewed following labs and imaging studies  CBC: Recent Labs  Lab 10/01/19 1918  WBC 8.9  NEUTROABS 6.5  HGB 8.0*  HCT 26.4*  MCV 81.2  PLT 0000000   Basic Metabolic Panel: Recent Labs  Lab 10/01/19 1918  NA 139  K 4.8  CL 106  CO2 24  GLUCOSE 108*  BUN 35*  CREATININE 1.62*  CALCIUM 8.7*   GFR: Estimated Creatinine Clearance: 40.3 mL/min (A) (by C-G formula based on SCr of 1.62 mg/dL (H)). Liver Function Tests: No results for input(s): AST, ALT, ALKPHOS, BILITOT, PROT, ALBUMIN in the last 168 hours. No results for input(s): LIPASE, AMYLASE in the last 168 hours. No results  for input(s): AMMONIA in the last 168 hours. Coagulation Profile: Recent Labs  Lab 10/02/19 1316  INR 1.1   Cardiac Enzymes: No results for input(s): CKTOTAL, CKMB, CKMBINDEX, TROPONINI in the last 168 hours. BNP (last 3 results) No results for input(s): PROBNP in the last 8760 hours. HbA1C: Recent Labs    10/01/19 2339  HGBA1C 7.8*   CBG: Recent Labs  Lab 10/01/19 2357 10/02/19 0721 10/02/19 1307 10/02/19 1644  GLUCAP 87 188* 162* 115*   Lipid Profile: No results for input(s): CHOL, HDL, LDLCALC, TRIG, CHOLHDL, LDLDIRECT in the last 72 hours. Thyroid Function Tests: No results for input(s): TSH, T4TOTAL, FREET4, T3FREE, THYROIDAB in the last 72 hours. Anemia Panel: No results for input(s): VITAMINB12, FOLATE, FERRITIN, TIBC, IRON, RETICCTPCT in the last 72 hours. Sepsis Labs: No results for input(s): PROCALCITON, LATICACIDVEN in the last 168 hours.  Recent Results (from the past 240 hour(s))  SARS CORONAVIRUS 2 (TAT 6-24 HRS) Nasopharyngeal Nasopharyngeal Swab     Status: None   Collection Time: 10/01/19 10:57 PM   Specimen: Nasopharyngeal  Swab  Result Value Ref Range Status   SARS Coronavirus 2 NEGATIVE NEGATIVE Final    Comment: (NOTE) SARS-CoV-2 target nucleic acids are NOT DETECTED. The SARS-CoV-2 RNA is generally detectable in upper and lower respiratory specimens during the acute phase of infection. Negative results do not preclude SARS-CoV-2 infection, do not rule out co-infections with other pathogens, and should not be used as the sole basis for treatment or other patient management decisions. Negative results must be combined with clinical observations, patient history, and epidemiological information. The expected result is Negative. Fact Sheet for Patients: SugarRoll.be Fact Sheet for Healthcare Providers: https://www.woods-mathews.com/ This test is not yet approved or cleared by the [place] FDA and    has been authorized for detection and/or diagnosis of SARS-CoV-2 by FDA under an Emergency Use Authorization (EUA). This EUA will remain  in effect (meaning this test can be used) for the duration of the COVID-19 declaration under Section 56 4(b)(1) of the Act, 21 U.S.C. section 360bbb-3(b)(1), unless the authorization is terminated or revoked sooner. Performed at [facility], [place] [address], [place], [place] [address]          Radiology Studies: DG Chest 2 View  Result Date: 10/01/2019 CLINICAL DATA:  Chest pain EXAM: CHEST - 2 VIEW COMPARISON:  12/24/2018 FINDINGS: Mildly low lung volumes. No focal opacity or pleural effusion. Stable cardiomediastinal silhouette. No pneumothorax. IMPRESSION: No active cardiopulmonary disease.  Low lung volumes. Electronically Signed   By: Donavan Foil M.D.   On: 10/01/2019 20:13   CT Head Wo Contrast  Result Date: 10/01/2019 CLINICAL DATA:  [place] and hit head EXAM: CT HEAD WITHOUT CONTRAST CT CERVICAL SPINE WITHOUT CONTRAST TECHNIQUE: Multidetector CT imaging of the head and cervical spine was performed following the standard protocol without intravenous contrast. Multiplanar CT image reconstructions of the cervical spine were also generated. COMPARISON:  CT 12/24/2018, 11/05/2015 FINDINGS: CT HEAD FINDINGS Brain: No acute territorial infarction, hemorrhage, or intracranial mass. Mild atrophy. Normal ventricle size. Vascular: No hyperdense vessels.  Carotid vascular calcification Skull: Normal. Negative for fracture or focal lesion. Sinuses/Orbits: Mild mucosal thickening in the ethmoid sinuses Other: None CT CERVICAL SPINE FINDINGS Alignment: Straightening of the cervical spine. No subluxation. Facet alignment is normal Skull base and vertebrae: No acute fracture. No primary bone lesion or focal pathologic process. Soft tissues and spinal canal: No prevertebral fluid or swelling. No visible canal hematoma. Disc levels: Moderate severe degenerative  changes at C5-C6, C6-C7 and C7 T1. Bilateral foraminal narrowing at these levels. Upper chest: Negative. Other: None IMPRESSION: 1. No CT evidence for acute intracranial abnormality.  Mild atrophy 2. Straightening of the cervical spine with degenerative changes. No acute osseous abnormality. Electronically Signed   By: Donavan Foil M.D.   On: 10/01/2019 20:10   CT Cervical Spine Wo Contrast  Result Date: 10/01/2019 CLINICAL DATA:  [place] and hit head EXAM: CT HEAD WITHOUT CONTRAST CT CERVICAL SPINE WITHOUT CONTRAST TECHNIQUE: Multidetector CT imaging of the head and cervical spine was performed following the standard protocol without intravenous contrast. Multiplanar CT image reconstructions of the cervical spine were also generated. COMPARISON:  CT 12/24/2018, 11/05/2015 FINDINGS: CT HEAD FINDINGS Brain: No acute territorial infarction, hemorrhage, or intracranial mass. Mild atrophy. Normal ventricle size. Vascular: No hyperdense vessels.  Carotid vascular calcification Skull: Normal. Negative for fracture or focal lesion. Sinuses/Orbits: Mild mucosal thickening in the ethmoid sinuses Other: None CT CERVICAL SPINE FINDINGS Alignment: Straightening of the cervical spine. No subluxation. Facet alignment is normal Skull  base and vertebrae: No acute fracture. No primary bone lesion or focal pathologic process. Soft tissues and spinal canal: No prevertebral fluid or swelling. No visible canal hematoma. Disc levels: Moderate severe degenerative changes at C5-C6, C6-C7 and C7 T1. Bilateral foraminal narrowing at these levels. Upper chest: Negative. Other: None IMPRESSION: 1. No CT evidence for acute intracranial abnormality.  Mild atrophy 2. Straightening of the cervical spine with degenerative changes. No acute osseous abnormality. Electronically Signed   By: Donavan Foil M.D.   On: 10/01/2019 20:10   DG Shoulder Left  Result Date: 10/01/2019 CLINICAL DATA:  Chest pain shoulder pain EXAM: LEFT SHOULDER - 2+ VIEW  COMPARISON:  None. FINDINGS: No fracture or malalignment. Mild calcific tendinitis. Mild AC joint degenerative change. IMPRESSION: No acute osseous abnormality Electronically Signed   By: Donavan Foil M.D.   On: 10/01/2019 20:15        Scheduled Meds: . aspirin EC  81 mg Oral Daily  . atorvastatin  80 mg Oral q1800  . clopidogrel  75 mg Oral Daily  . gabapentin  400 mg Oral QHS  . insulin aspart  0-15 Units Subcutaneous TID WC  . iron polysaccharides  150 mg Oral Daily  . pantoprazole  40 mg Oral Daily  . PARoxetine  40 mg Oral Daily  . QUEtiapine  100 mg Oral QHS  . sodium chloride flush  3 mL Intravenous Q12H  . topiramate  100 mg Oral Daily  . vitamin B-12  1,000 mcg Oral Daily   Continuous Infusions: . heparin 950 Units/hr (10/02/19 1323)     LOS: 0 days    Time spent: 35 minutes    Ezekiel Slocumb, DO [facility]   If 7PM-7AM, please contact night-coverage [email or web address] 10/02/2019, 5:11 PM

## 2019-10-02 NOTE — ED Notes (Signed)
Pt given meal tray.

## 2019-10-02 NOTE — ED Notes (Signed)
Cardiologist at bedside at this time.

## 2019-10-03 ENCOUNTER — Encounter
Admission: EM | Disposition: A | Discharge status: Home / Self Care | Payer: Self-pay | Source: Home / Self Care | Attending: Internal Medicine

## 2019-10-03 ENCOUNTER — Encounter: Payer: Self-pay | Admitting: Internal Medicine

## 2019-10-03 DIAGNOSIS — Z955 Presence of coronary angioplasty implant and graft: Secondary | ICD-10-CM

## 2019-10-03 DIAGNOSIS — E1142 Type 2 diabetes mellitus with diabetic polyneuropathy: Secondary | ICD-10-CM

## 2019-10-03 HISTORY — PX: CORONARY STENT INTERVENTION: CATH118234

## 2019-10-03 HISTORY — PX: LEFT HEART CATH AND CORONARY ANGIOGRAPHY: CATH118249

## 2019-10-03 LAB — BASIC METABOLIC PANEL
Anion gap: 8 (ref 5–15)
Anion gap: 6 (ref 5–15)
BUN: 31 mg/dL — ABNORMAL HIGH (ref 8–23)
BUN: 27 mg/dL — ABNORMAL HIGH (ref 8–23)
CO2: 21 mmol/L — ABNORMAL LOW (ref 22–32)
CO2: 23 mmol/L (ref 22–32)
Calcium: 8.5 mg/dL — ABNORMAL LOW (ref 8.9–10.3)
Calcium: 8.2 mg/dL — ABNORMAL LOW (ref 8.9–10.3)
Chloride: 108 mmol/L (ref 98–111)
Chloride: 107 mmol/L (ref 98–111)
Creatinine, Ser: 1.45 mg/dL — ABNORMAL HIGH (ref 0.61–1.24)
Creatinine, Ser: 1.39 mg/dL — ABNORMAL HIGH (ref 0.61–1.24)
GFR calc Af Amer: 55 mL/min — ABNORMAL LOW (ref >60)
GFR calc Af Amer: 57 mL/min — ABNORMAL LOW (ref >60)
GFR calc non Af Amer: 47 mL/min — ABNORMAL LOW (ref >60)
GFR calc non Af Amer: 50 mL/min — ABNORMAL LOW (ref >60)
Glucose, Bld: 164 mg/dL — ABNORMAL HIGH (ref 70–99)
Glucose, Bld: 257 mg/dL — ABNORMAL HIGH (ref 70–99)
Potassium: 4.3 mmol/L (ref 3.5–5.1)
Potassium: 4.6 mmol/L (ref 3.5–5.1)
Sodium: 137 mmol/L (ref 135–145)
Sodium: 136 mmol/L (ref 135–145)

## 2019-10-03 LAB — FERRITIN: Ferritin: 9 ng/mL — ABNORMAL LOW (ref 24–336)

## 2019-10-03 LAB — CBC
HCT: 22 % — ABNORMAL LOW (ref 39.0–52.0)
HCT: 23.2 % — ABNORMAL LOW (ref 39.0–52.0)
Hemoglobin: 6.7 g/dL — ABNORMAL LOW (ref 13.0–17.0)
Hemoglobin: 6.9 g/dL — ABNORMAL LOW (ref 13.0–17.0)
MCH: 24.2 pg — ABNORMAL LOW (ref 26.0–34.0)
MCH: 24.1 pg — ABNORMAL LOW (ref 26.0–34.0)
MCHC: 30.5 g/dL (ref 30.0–36.0)
MCHC: 29.7 g/dL — ABNORMAL LOW (ref 30.0–36.0)
MCV: 79.4 fL — ABNORMAL LOW (ref 80.0–100.0)
MCV: 81.1 fL (ref 80.0–100.0)
Platelets: 221 10*3/uL (ref 150–400)
Platelets: 240 10*3/uL (ref 150–400)
RBC: 2.77 MIL/uL — ABNORMAL LOW (ref 4.22–5.81)
RBC: 2.86 MIL/uL — ABNORMAL LOW (ref 4.22–5.81)
RDW: 16.2 % — ABNORMAL HIGH (ref 11.5–15.5)
RDW: 16.5 % — ABNORMAL HIGH (ref 11.5–15.5)
WBC: 6.6 10*3/uL (ref 4.0–10.5)
WBC: 5.6 10*3/uL (ref 4.0–10.5)
nRBC: 0 % (ref 0.0–0.2)
nRBC: 0 % (ref 0.0–0.2)

## 2019-10-03 LAB — IRON AND TIBC
Iron: 17 ug/dL — ABNORMAL LOW (ref 45–182)
Saturation Ratios: 4 % — ABNORMAL LOW (ref 17.9–39.5)
TIBC: 391 ug/dL (ref 250–450)
UIBC: 374 ug/dL

## 2019-10-03 LAB — GLUCOSE, CAPILLARY
Glucose-Capillary: 138 mg/dL — ABNORMAL HIGH (ref 70–99)
Glucose-Capillary: 158 mg/dL — ABNORMAL HIGH (ref 70–99)
Glucose-Capillary: 227 mg/dL — ABNORMAL HIGH (ref 70–99)
Glucose-Capillary: 137 mg/dL — ABNORMAL HIGH (ref 70–99)
Glucose-Capillary: 232 mg/dL — ABNORMAL HIGH (ref 70–99)

## 2019-10-03 LAB — RETICULOCYTES
Immature Retic Fract: 14.8 % (ref 2.3–15.9)
RBC.: 2.81 MIL/uL — ABNORMAL LOW (ref 4.22–5.81)
Retic Count, Absolute: 33.2 10*3/uL (ref 19.0–186.0)
Retic Ct Pct: 1.2 % (ref 0.4–3.1)

## 2019-10-03 LAB — PROTIME-INR
INR: 1.6 — ABNORMAL HIGH (ref 0.8–1.2)
Prothrombin Time: 18.6 seconds — ABNORMAL HIGH (ref 11.4–15.2)

## 2019-10-03 LAB — OCCULT BLOOD X 1 CARD TO LAB, STOOL: Fecal Occult Bld: NEGATIVE

## 2019-10-03 LAB — HEMOGLOBIN AND HEMATOCRIT, BLOOD
HCT: 26.9 % — ABNORMAL LOW (ref 39.0–52.0)
Hemoglobin: 8.3 g/dL — ABNORMAL LOW (ref 13.0–17.0)

## 2019-10-03 LAB — FOLATE: Folate: 12.8 ng/mL (ref >5.9)

## 2019-10-03 LAB — MAGNESIUM: Magnesium: 2 mg/dL (ref 1.7–2.4)

## 2019-10-03 LAB — HEPARIN LEVEL (UNFRACTIONATED): Heparin Unfractionated: 0.39 IU/mL (ref 0.30–0.70)

## 2019-10-03 LAB — VITAMIN B12: Vitamin B-12: 432 pg/mL (ref 180–914)

## 2019-10-03 LAB — POCT ACTIVATED CLOTTING TIME: Activated Clotting Time: 362 seconds

## 2019-10-03 LAB — TROPONIN I (HIGH SENSITIVITY)
Troponin I (High Sensitivity): 12 ng/L (ref <18)
Troponin I (High Sensitivity): 12 ng/L (ref <18)

## 2019-10-03 LAB — PREPARE RBC (CROSSMATCH)

## 2019-10-03 SURGERY — LEFT HEART CATH AND CORONARY ANGIOGRAPHY
Anesthesia: Moderate Sedation

## 2019-10-03 MED ORDER — SODIUM CHLORIDE 0.9 % IV SOLN: 250.0000 mL | INTRAVENOUS | Status: DC | PRN

## 2019-10-03 MED ORDER — NITROGLYCERIN 1 MG/10 ML FOR IR/CATH LAB
INTRA_ARTERIAL | Status: DC | PRN
  Administered 2019-10-03 (×2): 200 ug via INTRACORONARY

## 2019-10-03 MED ORDER — CLOPIDOGREL BISULFATE 75 MG PO TABS
ORAL_TABLET | ORAL | Status: DC | PRN
  Administered 2019-10-03: 300 mg via ORAL

## 2019-10-03 MED ORDER — ASPIRIN 81 MG PO CHEW
CHEWABLE_TABLET | ORAL | Status: AC
  Filled 2019-10-03: qty 1

## 2019-10-03 MED ORDER — METOPROLOL TARTRATE 25 MG PO TABS
12.5000 mg | ORAL_TABLET | Freq: Two times a day (BID) | ORAL | Status: DC
  Administered 2019-10-03 – 2019-10-05 (×4): 12.5 mg via ORAL
  Filled 2019-10-03 (×5): qty 1

## 2019-10-03 MED ORDER — SODIUM CHLORIDE 0.9 % WEIGHT BASED INFUSION: 1.0000 mL/kg/h | INTRAVENOUS | Status: AC

## 2019-10-03 MED ORDER — LABETALOL HCL 5 MG/ML IV SOLN: 10.0000 mg | INTRAVENOUS | Status: AC | PRN

## 2019-10-03 MED ORDER — MORPHINE SULFATE (PF) 2 MG/ML IV SOLN: 2.0000 mg | INTRAVENOUS | Status: DC | PRN

## 2019-10-03 MED ORDER — CLOPIDOGREL BISULFATE 75 MG PO TABS
ORAL_TABLET | ORAL | Status: AC
  Filled 2019-10-03: qty 4

## 2019-10-03 MED ORDER — ASPIRIN 81 MG PO CHEW: 81.0000 mg | CHEWABLE_TABLET | Freq: Every day | ORAL | Status: DC

## 2019-10-03 MED ORDER — SODIUM CHLORIDE 0.9% FLUSH: 3.0000 mL | Freq: Two times a day (BID) | INTRAVENOUS | Status: DC

## 2019-10-03 MED ORDER — HEPARIN (PORCINE) IN NACL 1000-0.9 UT/500ML-% IV SOLN
INTRAVENOUS | Status: AC
  Filled 2019-10-03: qty 1000

## 2019-10-03 MED ORDER — IOHEXOL 300 MG/ML  SOLN
INTRAMUSCULAR | Status: DC | PRN
  Administered 2019-10-03: 230 mL via INTRA_ARTERIAL

## 2019-10-03 MED ORDER — ASPIRIN 81 MG PO CHEW
CHEWABLE_TABLET | ORAL | Status: DC | PRN
  Administered 2019-10-03: 243 mg via ORAL

## 2019-10-03 MED ORDER — MIDAZOLAM HCL 2 MG/2ML IJ SOLN
INTRAMUSCULAR | Status: AC
  Filled 2019-10-03: qty 2

## 2019-10-03 MED ORDER — HEPARIN (PORCINE) IN NACL 1000-0.9 UT/500ML-% IV SOLN
INTRAVENOUS | Status: DC | PRN
  Administered 2019-10-03: 500 mL

## 2019-10-03 MED ORDER — SODIUM CHLORIDE 0.9% IV SOLUTION: Freq: Once | INTRAVENOUS | Status: AC

## 2019-10-03 MED ORDER — NITROGLYCERIN 0.4 MG SL SUBL
0.4000 mg | SUBLINGUAL_TABLET | SUBLINGUAL | Status: DC | PRN
  Administered 2019-10-03 – 2019-10-04 (×2): 0.4 mg via SUBLINGUAL
  Filled 2019-10-03 (×2): qty 1

## 2019-10-03 MED ORDER — SODIUM CHLORIDE 0.9% FLUSH: 3.0000 mL | INTRAVENOUS | Status: DC | PRN

## 2019-10-03 MED ORDER — SODIUM CHLORIDE 0.9 % WEIGHT BASED INFUSION
1.0000 mL/kg/h | INTRAVENOUS | Status: DC
  Administered 2019-10-03: 1 mL/kg/h via INTRAVENOUS

## 2019-10-03 MED ORDER — NITROGLYCERIN 2 % TD OINT
0.5000 [in_us] | TOPICAL_OINTMENT | Freq: Four times a day (QID) | TRANSDERMAL | Status: DC
  Administered 2019-10-03 – 2019-10-05 (×4): 0.5 [in_us] via TOPICAL
  Filled 2019-10-03 (×5): qty 1

## 2019-10-03 MED ORDER — MIDAZOLAM HCL 2 MG/2ML IJ SOLN
INTRAMUSCULAR | Status: DC | PRN
  Administered 2019-10-03: 1 mg via INTRAVENOUS

## 2019-10-03 MED ORDER — CLOPIDOGREL BISULFATE 75 MG PO TABS: 75.0000 mg | ORAL_TABLET | Freq: Every day | ORAL | Status: DC

## 2019-10-03 MED ORDER — ACETAMINOPHEN 325 MG PO TABS: 650.0000 mg | ORAL_TABLET | ORAL | Status: DC | PRN

## 2019-10-03 MED ORDER — FENTANYL CITRATE (PF) 100 MCG/2ML IJ SOLN
INTRAMUSCULAR | Status: DC | PRN
  Administered 2019-10-03: 50 ug via INTRAVENOUS

## 2019-10-03 MED ORDER — ONDANSETRON HCL 4 MG/2ML IJ SOLN: 4.0000 mg | Freq: Four times a day (QID) | INTRAMUSCULAR | Status: DC | PRN

## 2019-10-03 MED ORDER — ASPIRIN 81 MG PO CHEW
CHEWABLE_TABLET | ORAL | Status: AC
  Filled 2019-10-03: qty 3

## 2019-10-03 MED ORDER — NITROGLYCERIN 1 MG/10 ML FOR IR/CATH LAB
INTRA_ARTERIAL | Status: AC
  Filled 2019-10-03: qty 10

## 2019-10-03 MED ORDER — SODIUM CHLORIDE 0.9 % IV SOLN
INTRAVENOUS | Status: DC | PRN
  Administered 2019-10-03: 1.75 mg/kg/h via INTRAVENOUS

## 2019-10-03 MED ORDER — HYDRALAZINE HCL 20 MG/ML IJ SOLN: 10.0000 mg | INTRAMUSCULAR | Status: AC | PRN

## 2019-10-03 MED ORDER — FENTANYL CITRATE (PF) 100 MCG/2ML IJ SOLN
INTRAMUSCULAR | Status: AC
  Filled 2019-10-03: qty 2

## 2019-10-03 MED ORDER — BIVALIRUDIN TRIFLUOROACETATE 250 MG IV SOLR
INTRAVENOUS | Status: AC
  Filled 2019-10-03: qty 250

## 2019-10-03 MED ORDER — SODIUM CHLORIDE 0.9% FLUSH
3.0000 mL | Freq: Two times a day (BID) | INTRAVENOUS | Status: DC
  Administered 2019-10-03 – 2019-10-05 (×4): 3 mL via INTRAVENOUS

## 2019-10-03 MED ORDER — IOHEXOL 300 MG/ML  SOLN
INTRAMUSCULAR | Status: DC | PRN
  Administered 2019-10-03: 90 mL

## 2019-10-03 MED ORDER — SODIUM CHLORIDE 0.9 % IV SOLN
510.0000 mg | Freq: Once | INTRAVENOUS | Status: AC
  Administered 2019-10-03: 510 mg via INTRAVENOUS
  Filled 2019-10-03: qty 17

## 2019-10-03 MED ORDER — ACETAMINOPHEN 325 MG PO TABS
650.0000 mg | ORAL_TABLET | ORAL | Status: DC | PRN
  Administered 2019-10-04: 650 mg via ORAL

## 2019-10-03 MED ORDER — ASPIRIN 81 MG PO CHEW
81.0000 mg | CHEWABLE_TABLET | ORAL | Status: AC
  Administered 2019-10-03: 81 mg via ORAL

## 2019-10-03 MED ORDER — BIVALIRUDIN BOLUS VIA INFUSION - CUPID
INTRAVENOUS | Status: DC | PRN
  Administered 2019-10-03: 64.5 mg via INTRAVENOUS

## 2019-10-03 SURGICAL SUPPLY — 18 items
BALLN TREK RX 2.5X15 (BALLOONS) ×3
BALLOON TREK RX 2.5X15 (BALLOONS) IMPLANT
CATH INFINITI 5FR ANG PIGTAIL (CATHETERS) ×2 IMPLANT
CATH INFINITI 5FR JL4 (CATHETERS) ×2 IMPLANT
CATH INFINITI JR4 5F (CATHETERS) ×2 IMPLANT
CATH VISTA GUIDE 6FR XB3.5 SH (CATHETERS) ×2 IMPLANT
DEVICE CLOSURE MYNXGRIP 6/7F (Vascular Products) ×2 IMPLANT
DEVICE INFLAT 30 PLUS (MISCELLANEOUS) ×2 IMPLANT
KIT MANI 3VAL PERCEP (MISCELLANEOUS) ×3 IMPLANT
NDL PERC 18GX7CM (NEEDLE) IMPLANT
NEEDLE PERC 18GX7CM (NEEDLE) ×3 IMPLANT
PACK CARDIAC CATH (CUSTOM PROCEDURE TRAY) ×3 IMPLANT
SHEATH AVANTI 5FR X 11CM (SHEATH) ×2 IMPLANT
SHEATH AVANTI 6FR X 11CM (SHEATH) ×2 IMPLANT
STENT RESOLUTE ONYX 2.25X12 (Permanent Stent) ×2 IMPLANT
STENT RESOLUTE ONYX 2.5X38 (Permanent Stent) ×2 IMPLANT
WIRE G HI TQ BMW 190 (WIRE) ×2 IMPLANT
WIRE GUIDERIGHT .035X150 (WIRE) ×2 IMPLANT

## 2019-10-03 NOTE — Progress Notes (Signed)
PROGRESS NOTE    Patrick Turner  [id number] DOB: 15-Jun-1946 DOA: 10/01/2019  PCP: Kirk Ruths, MD    LOS - 1   Brief Narrative:  74 y.o. 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  Left-sided, associated dizziness.  Chest pain resolved in the ED after getting 4mg  of morphine, aspirin and SQ nitro.  Troponin of 24. Sinus EKG without any ST or T wave changes.  Admitted to hospitalist service with cardiology consult for further evaluation.   Subjective 1/20: Patient seen in his room after cardiac cath.  Denies any significant pain or shortness of breath.  No further chest pain.  Assessment & Plan:   Principal Problem:   Chest pain Active Problems:   Acute on chronic renal insufficiency   HTN (hypertension), benign   HLD (hyperlipidemia)   Aortic atherosclerosis (HCC)   Depression, major, in remission (HCC)   OSA (obstructive sleep apnea)   Chronic mesenteric ischemia (HCC)   Unstable angina (HCC)   Atypical chest pain chest pain, likely due to Unstable Angina Troponin of 24->21->21.  EKG without any ST or T wave changes. No significant CAD history but has risk factors including, notably, chronic mesenteric ischemia and atherosclerosis.   --cardiology consulted  (Dr. Humphrey Rolls) --heparin gtt --cont ASA, Plavix, Lipitor --SL nitro as needed --As needed morphine --Patient underwent cardiac cath on 1/21 with 2 stents placed in LAD.  Iron deficiency anemia Hemoglobin has been between 8-9 for the last year and a half.  On admission, hemoglobin trended down to 6.7.  2 units of PRBC has been ordered and follow-up hemoglobin improved to 8.3.  He does not describe any episodes of GI bleeding.  He reports having a colonoscopy in September of last year that was reportedly  unremarkable.  Anemia panel indicates iron deficiency.  B12 and folate are unremarkable.  We will give IV iron infusion.  Check stool for occult blood.  If FOBT is positive, can consider GI consult.  Hypertension - uncontrolled on admission, improved.  Hold lisinopril-HCTZ due to AKI PRN hydralazine and/or labetalol as needed  Aortic arthrosclerosis Continue DAPT as above.  SMA stenosis s/p stent Asymptomatic.  Benign abdominal exam.  Continue DAPT.  Type 2 diabetes with neuropathy Normally on 15 units levermir --moderate sliding scale Novolog --Blood sugar stable  Acute on CKD stage III Creatinine of 1.62 -> 1.39 Improved with IV fluids Continue to monitor since patient received IV contrast If creatinine begins to trend up, will consult nephrology.  HLD Continue statin   Depression Continue Paxil, seroquel  Parkinsonism symptoms Continue topamax  OSA Not on CPAP   DVT prophylaxis: SCDs   Code Status: Full Code  Family Communication: none at bedside  Disposition Plan: Discharge home once cleared by cardiology and hemoglobin is stable.   Consultants:   Cardiology, Dr. Humphrey Rolls  Procedures:   Cardiac cath:Prox RCA lesion is 60% stenosed.  1st Mrg lesion is 90% stenosed.  Ost Cx to Prox Cx lesion is 45% stenosed.  Mid LAD lesion is 70% stenosed.  Dist LAD lesion is 80% stenosed.    PCI of the LAD/drug-eluting stenting.  Antimicrobials:   none    Objective: Vitals:   10/03/19 1400 10/03/19 1519 10/03/19 1634 10/03/19 2006  BP: (!) 120/59 (!) 133/47 (!) 153/60 (!) 144/53  Pulse:  84 79 70 77  Resp: 18 19 18 20   Temp: 97.7 F (36.5 C) 98.1 F (36.7 C) 98.5 F (36.9 C) 98.8 F (37.1 C)  TempSrc: Axillary Oral Oral Oral  SpO2: 100% 96% 98% 100%  Weight:      Height:        Intake/Output Summary (Last 24 hours) at 10/03/2019 2055 Last data filed at 10/03/2019 1900 Gross per 24 hour  Intake 844.72 ml  Output 1700 ml  Net -855.28 ml    Filed Weights   10/01/19 1913 10/02/19 1118  Weight: 83.5 kg 86 kg    Examination:  General exam: Alert, awake, oriented x 3 Respiratory system: Clear to auscultation. Respiratory effort normal. Cardiovascular system:RRR. No murmurs, rubs, gallops. Gastrointestinal system: Abdomen is nondistended, soft and nontender. No organomegaly or masses felt. Normal bowel sounds heard. Central nervous system: Alert and oriented. No focal neurological deficits. Extremities: No C/C/E, +pedal pulses Skin: No rashes, lesions or ulcers Psychiatry: Judgement and insight appear normal. Mood & affect appropriate.      Data Reviewed: I have personally reviewed following labs and imaging studies  CBC: Recent Labs  Lab 10/01/19 1918 10/03/19 0149 10/03/19 1125 10/03/19 1914  WBC 8.9 6.6 5.6  --   NEUTROABS 6.5  --   --   --   HGB 8.0* 6.7* 6.9* 8.3*  HCT 26.4* 22.0* 23.2* 26.9*  MCV 81.2 79.4* 81.1  --   PLT 254 221 240  --    Basic Metabolic Panel: Recent Labs  Lab 10/01/19 1918 10/03/19 0149 10/03/19 1125  NA 139 137 136  K 4.8 4.3 4.6  CL 106 108 107  CO2 24 21* 23  GLUCOSE 108* 164* 257*  BUN 35* 31* 27*  CREATININE 1.62* 1.45* 1.39*  CALCIUM 8.7* 8.5* 8.2*  MG  --  2.0  --    GFR: Estimated Creatinine Clearance: 47 mL/min (A) (by C-G formula based on SCr of 1.39 mg/dL (H)). Liver Function Tests: No results for input(s): AST, ALT, ALKPHOS, BILITOT, PROT, ALBUMIN in the last 168 hours. No results for input(s): LIPASE, AMYLASE in the last 168 hours. No results for input(s): AMMONIA in the last 168 hours. Coagulation Profile: Recent Labs  Lab 10/02/19 1316 10/03/19 1125  INR 1.1 1.6*   Cardiac Enzymes: No results for input(s): CKTOTAL, CKMB, CKMBINDEX, TROPONINI in the last 168 hours. BNP (last 3 results) No results for input(s): PROBNP in the last 8760 hours. HbA1C: Recent Labs    10/01/19 2339  HGBA1C 7.8*   CBG: Recent Labs  Lab 10/02/19 2049 10/03/19 0718  10/03/19 0955 10/03/19 1140 10/03/19 1630  GLUCAP 192* 138* 158* 227* 137*   Lipid Profile: No results for input(s): CHOL, HDL, LDLCALC, TRIG, CHOLHDL, LDLDIRECT in the last 72 hours. Thyroid Function Tests: No results for input(s): TSH, T4TOTAL, FREET4, T3FREE, THYROIDAB in the last 72 hours. Anemia Panel: Recent Labs    10/03/19 1125  VITAMINB12 432  FOLATE 12.8  FERRITIN 9*  TIBC 391  IRON 17*  RETICCTPCT 1.2   Sepsis Labs: No results for input(s): PROCALCITON, LATICACIDVEN in the last 168 hours.  Recent Results (from the past 240 hour(s))  SARS CORONAVIRUS 2 (TAT 6-24 HRS) Nasopharyngeal Nasopharyngeal Swab     Status: None   Collection Time: 10/01/19 10:57 PM   Specimen: Nasopharyngeal Swab  Result Value Ref Range Status   SARS Coronavirus 2 NEGATIVE NEGATIVE Final    Comment: (NOTE) SARS-CoV-2 target nucleic acids are NOT DETECTED. The SARS-CoV-2 RNA  is generally detectable in upper and lower respiratory specimens during the acute phase of infection. Negative results do not preclude SARS-CoV-2 infection, do not rule out co-infections with other pathogens, and should not be used as the sole basis for treatment or other patient management decisions. Negative results must be combined with clinical observations, patient history, and epidemiological information. The expected result is Negative. Fact Sheet for Patients: SugarRoll.be Fact Sheet for Healthcare Providers: https://www.woods-mathews.com/ This test is not yet approved or cleared by the [place] FDA and  has been authorized for detection and/or diagnosis of SARS-CoV-2 by FDA under an Emergency Use Authorization (EUA). This EUA will remain  in effect (meaning this test can be used) for the duration of the COVID-19 declaration under Section 56 4(b)(1) of the Act, 21 U.S.C. section 360bbb-3(b)(1), unless the authorization is terminated or revoked sooner. Performed  at [facility], [place] [address], [place], [place] [address]          Radiology Studies: CARDIAC CATHETERIZATION  Result Date: 10/03/2019  Prox RCA lesion is 60% stenosed.  1st Mrg lesion is 90% stenosed.  Ost Cx to Prox Cx lesion is 45% stenosed.  Mid LAD lesion is 70% stenosed.  Dist LAD lesion is 80% stenosed.  PCI of the LAD/drug-eluting stenting.        Scheduled Meds: . aspirin EC  81 mg Oral Daily  . atorvastatin  80 mg Oral q1800  . clopidogrel  75 mg Oral Daily  . gabapentin  400 mg Oral QHS  . insulin aspart  0-15 Units Subcutaneous TID WC  . iron polysaccharides  150 mg Oral Daily  . metoprolol tartrate  12.5 mg Oral BID  . nitroGLYCERIN  0.5 inch Topical Q6H  . pantoprazole  40 mg Oral Daily  . PARoxetine  40 mg Oral Daily  . QUEtiapine  100 mg Oral QHS  . sodium chloride flush  3 mL Intravenous Q12H  . sodium chloride flush  3 mL Intravenous Q12H  . sodium chloride flush  3 mL Intravenous Q12H  . topiramate  100 mg Oral Daily  . vitamin B-12  1,000 mcg Oral Daily   Continuous Infusions: . sodium chloride    . sodium chloride       LOS: 1 day    Time spent: 35 minutes    Kathie Dike, MD [facility]   If 7PM-7AM, please contact night-coverage [email or web address] 10/03/2019, 8:55 PM

## 2019-10-03 NOTE — Consult Note (Signed)
ANTICOAGULATION CONSULT NOTE -  Pharmacy Consult for Heparin Indication: chest pain/ACS  Allergies  Allergen Reactions  . Penicillins Anaphylaxis    Has patient had a PCN reaction causing immediate rash, facial/tongue/throat swelling, SOB or lightheadedness with hypotension: Yes Has patient had a PCN reaction causing severe rash involving mucus membranes or skin necrosis: No Has patient had a PCN reaction that required hospitalization Yes Has patient had a PCN reaction occurring within the last 10 years: No If all of the above answers are "NO", then may proceed with Cephalosporin use.  . Tavist [Clemastine] Swelling  . Loratadine     Patient states he is not aware of this allergy and request removal.     Patient Measurements: Height: 5\' 5"  (165.1 cm) Weight: 189 lb 11.2 oz (86 kg) IBW/kg (Calculated) : 61.5 Heparin Dosing Weight: 79.6 kg  Vital Signs: Temp: 97.9 F (36.6 C) (01/21 0320) Temp Source: Oral (01/21 0320) BP: 145/59 (01/21 0320) Pulse Rate: 71 (01/21 0320)  Labs: Recent Labs    10/01/19 1918 10/01/19 2115 10/01/19 2339 10/02/19 1316 10/02/19 2059 10/03/19 0008 10/03/19 0149  HGB 8.0*  --   --   --   --   --  6.7*  HCT 26.4*  --   --   --   --   --  22.0*  PLT 254  --   --   --   --   --  221  APTT  --   --   --  34  --   --   --   LABPROT  --   --   --  13.6  --   --   --   INR  --   --   --  1.1  --   --   --   HEPARINUNFRC  --   --   --   --  0.35  --  0.39  CREATININE 1.62*  --   --   --   --   --  1.45*  TROPONINIHS 24*   < > 21*  --   --  12 12   < > = values in this interval not displayed.    Estimated Creatinine Clearance: 45.1 mL/min (A) (by C-G formula based on SCr of 1.45 mg/dL (H)).   Medical History: Past Medical History:  Diagnosis Date  . Altered mental status   . Anemia   . Anxiety   . Aortic atherosclerosis ([place])   . Arthritis   . Coronary artery disease   . CRD (chronic renal disease)   . Depression   . Diabetes mellitus  without complication ([place])   . Encephalopathy acute   . GERD (gastroesophageal reflux disease)   . Headache   . Hypercholesteremia   . Hypertension   . Neuropathy   . Severe obesity ([place])   . Sleep apnea   . Sleep terror    per patient this year per patient     Medications:  Medications Prior to Admission  Medication Sig Dispense Refill Last Dose  . aspirin 81 MG tablet Take 1 tablet (81 mg total) by mouth daily.   09/30/2019 at Unknown time  . atorvastatin (LIPITOR) 80 MG tablet Take 80 mg by mouth at bedtime.   09/30/2019 at 2100  . b complex vitamins capsule Take 1 capsule by mouth daily.   09/30/2019 at 0800  . clonazePAM (KLONOPIN) 1 MG tablet Take 0.5 mg by mouth at bedtime as needed for anxiety.  2 Unknown at PRN  . clopidogrel (PLAVIX) 75 MG tablet Take 1 tablet (75 mg total) by mouth daily. 30 tablet 11 09/30/2019 at Unknown time  . gabapentin (NEURONTIN) 400 MG capsule Take 400 mg by mouth daily.    09/30/2019 at Unknown time  . insulin detemir (LEVEMIR) 100 UNIT/ML injection Inject 15 Units into the skin daily.   09/30/2019 at Unknown time  . iron polysaccharides (NIFEREX) 150 MG capsule Take 150 mg by mouth daily.   09/30/2019 at Unknown time  . lisinopril-hydrochlorothiazide (PRINZIDE,ZESTORETIC) 20-12.5 MG per tablet Take 1 tablet by mouth daily.    09/30/2019 at Unknown time  . metFORMIN (GLUCOPHAGE-XR) 500 MG 24 hr tablet Take 1,000 mg by mouth 2 (two) times daily with a meal.   09/30/2019 at 1700  . Omega-3 Fatty Acids (FISH OIL) 1000 MG CAPS Take 1,000 mg by mouth daily.      Marland Kitchen OZEMPIC, 0.25 OR 0.5 MG/DOSE, 2 MG/1.5ML SOPN Inject 1 Dose into the skin once a week.    Past Week at Unknown time  . pantoprazole (PROTONIX) 40 MG tablet Take 40 mg by mouth daily.   09/30/2019 at Unknown time  . PARoxetine (PAXIL) 40 MG tablet Take 40 mg by mouth daily.    09/30/2019 at 0800  . pioglitazone (ACTOS) 15 MG tablet Take 15 mg by mouth daily.    09/30/2019 at 0800  . QUEtiapine (SEROQUEL)  100 MG tablet Take 100 mg by mouth at bedtime.   09/30/2019 at 2100  . topiramate (TOPAMAX) 100 MG tablet Take 100 mg by mouth daily.    09/30/2019 at 0800  . vitamin B-12 (CYANOCOBALAMIN) 1000 MCG tablet Take 1,000 mcg by mouth daily.      Scheduled:  . aspirin EC  81 mg Oral Daily  . atorvastatin  80 mg Oral q1800  . clopidogrel  75 mg Oral Daily  . gabapentin  400 mg Oral QHS  . insulin aspart  0-15 Units Subcutaneous TID WC  . iron polysaccharides  150 mg Oral Daily  . nitroGLYCERIN  0.5 inch Topical Q6H  . pantoprazole  40 mg Oral Daily  . PARoxetine  40 mg Oral Daily  . QUEtiapine  100 mg Oral QHS  . sodium chloride flush  3 mL Intravenous Q12H  . topiramate  100 mg Oral Daily  . vitamin B-12  1,000 mcg Oral Daily   Infusions:   PRN: acetaminophen, clonazePAM, hydrALAZINE, labetalol, morphine injection, nitroGLYCERIN, ondansetron (ZOFRAN) IV Anti-infectives (From admission, onward)   None      Assessment: Patient was order a heparin infusion. Pharmacy will monitor heparin infusion.  Goal of Therapy:  Heparin level 0.3-0.7 units/ml Monitor platelets by anticoagulation protocol: Yes   Plan:  Start heparin infusion at 950 units/hr Check anti-Xa level in 8 hours and daily while on heparin Continue to monitor H&H and platelets  1/20:  HL @ 2059 = 0.35 Will continue pt on current rate and recheck HL in 8 hrs on 1/21 @ 0500.   1/21 @ 0149 HL = 0.39 (drawn early), therapeutic x 2, H/H worse, Plts stable.  Per MD note going for cath this am, heparin drip "expired" from [facility] at 0329.  F/U plans post cath.  Ena Dawley, PharmD 10/03/2019,4:42 AM

## 2019-10-03 NOTE — Progress Notes (Signed)
Inpatient Diabetes Program Recommendations  AACE/ADA: New Consensus Statement on Inpatient Glycemic Control   Target Ranges:  Prepandial:   less than 140 mg/dL      Peak postprandial:   less than 180 mg/dL (1-2 hours)      Critically ill patients:  140 - 180 mg/dL   Results for RAIF, SARAFIN (MRN [id number]) as of 10/03/2019 13:40  Ref. Range 10/02/2019 07:21 10/02/2019 13:07 10/02/2019 16:44 10/02/2019 20:49 10/03/2019 07:18 10/03/2019 09:55 10/03/2019 11:40  Glucose-Capillary Latest Ref Range: 70 - 99 mg/dL 188 (H) 162 (H) 115 (H) 192 (H) 138 (H) 158 (H) 227 (H)   Review of Glycemic Control  Diabetes history: DM2 Outpatient Diabetes medications: Levemir 15 units daily, Metformin 1000 mg BID, Actos 15 mg daily, Ozempic 0.25 mg Qweek (Dr. Gabriel Carina advised pt to restart taking on 09/27/19) Current orders for Inpatient glycemic control: Novolog 0-15 units TID with meals  Inpatient Diabetes Program Recommendations:   Insulin- HS Coverage: Please consider ordering Novolog 0-5 units QHS for bedtime correction.  Diet: Please consider discontinuing Regular diet and ordering Carb Modified diet.  Insulin - Meal Coverage: If post prandial glucose is consistently greater than 180 mg/dl, please consider ordering Novolog 3 units TID with meals for meal coverage if patient eats at least 50% of meals.  NOTE: In reviewing chart, noted patient sees Dr. Gabriel Carina (Endocrinologist) and was last seen on 09/27/19. Per office note on 09/27/19, Dr. Gabriel Carina advised patient to restart on Ozempic 0.25 mg Qweek (had stopped in the fall of 2020 due to cost while in doughnut hole).   Thanks, Barnie Alderman, RN, MSN, CDE Diabetes Coordinator Inpatient Diabetes Program [phone] (Team Pager from 8am to 5pm)

## 2019-10-03 NOTE — Progress Notes (Signed)
PT Cancellation Note  Patient Details Name: Patrick Turner MRN: [id number] DOB: [birth year]   Cancelled Treatment:    Reason Eval/Treat Not Completed: Medical issues which prohibited therapy;Patient at procedure or test/unavailable Pt has been of of room all morning for procedure, Hgb is 6.7 and likely he will need a transfusion before seen by PT.  Will maintain on caseload and continue to monitor, likely hold for PT today.   Kreg Shropshire, DPT 10/03/2019, 10:32 AM

## 2019-10-03 NOTE — Progress Notes (Addendum)
    BRIEF OVERNIGHT PROGRESS REPORT   SUBJECTIVE: Patient c/o chest pain " worst I have ever had" rating 10/10 on pain intesnsity rating scale. No associated symptoms of diaphoresis, radiation, SOB, nausea or vomiting.  OBJECTIVE: Afebrile with blood pressure 130/57 mm Hg and pulse rate 85 beats/min. There were no focal neurological deficits; he was alert and oriented x4.  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 who presents with concerns of acute onset left-sided chest pain  PLAN: Unstable angina with multiple risk factors for coronary artery disease and chest pain at rest Hx CAD, Aortic atherosclerosis, HLD - Obtain repeat EKG, Troponin, BMP and mag - Continue ASA 81mg  PO daily and Clopidogrel 75mg  PO daily - Continue Atorvastatin 80mg  PO daily - start Metoprolol 12.5mg  PO bid (titrate to goal HR<70)  - NTG + morphine PRN chest pain  - Apply nitropaste 0.5 inch every 6 hours, monitor BP - Continue Anti-coagulation (Heparin drip per ACS protocol) - Cardiology input appreciated / Interventional Assessment, plan for cath in the am     Rufina Falco, DNP, CCRN, FNP-C [facility] Nurse Practitioner Between 7pm to 7am - Pager [phone]  After 7am go to [email or web address] - password:[email or web address]  [facility] [employer]  [phone]

## 2019-10-03 NOTE — Progress Notes (Signed)
SUBJECTIVE: Patient still having chest pain   Vitals:   10/02/19 2354 10/03/19 0012 10/03/19 0320 10/03/19 0712  BP: (!) 146/51 (!) 130/57 (!) 145/59 (!) 160/57  Pulse: 84 85 71 72  Resp:    13  Temp:   97.9 F (36.6 C) 98.2 F (36.8 C)  TempSrc:   Oral Oral  SpO2: 98% 96% 100% 97%  Weight:      Height:        Intake/Output Summary (Last 24 hours) at 10/03/2019 0835 Last data filed at 10/03/2019 0622 Gross per 24 hour  Intake --  Output 1350 ml  Net -1350 ml    LABS: Basic Metabolic Panel: Recent Labs    10/01/19 1918 10/03/19 0149  NA 139 137  K 4.8 4.3  CL 106 108  CO2 24 21*  GLUCOSE 108* 164*  BUN 35* 31*  CREATININE 1.62* 1.45*  CALCIUM 8.7* 8.5*  MG  --  2.0   Liver Function Tests: No results for input(s): AST, ALT, ALKPHOS, BILITOT, PROT, ALBUMIN in the last 72 hours. No results for input(s): LIPASE, AMYLASE in the last 72 hours. CBC: Recent Labs    10/01/19 1918 10/03/19 0149  WBC 8.9 6.6  NEUTROABS 6.5  --   HGB 8.0* 6.7*  HCT 26.4* 22.0*  MCV 81.2 79.4*  PLT 254 221   Cardiac Enzymes: No results for input(s): CKTOTAL, CKMB, CKMBINDEX, TROPONINI in the last 72 hours. BNP: Invalid input(s): POCBNP D-Dimer: No results for input(s): DDIMER in the last 72 hours. Hemoglobin A1C: Recent Labs    10/01/19 2339  HGBA1C 7.8*   Fasting Lipid Panel: No results for input(s): CHOL, HDL, LDLCALC, TRIG, CHOLHDL, LDLDIRECT in the last 72 hours. Thyroid Function Tests: No results for input(s): TSH, T4TOTAL, T3FREE, THYROIDAB in the last 72 hours.  Invalid input(s): FREET3 Anemia Panel: No results for input(s): VITAMINB12, FOLATE, FERRITIN, TIBC, IRON, RETICCTPCT in the last 72 hours.   PHYSICAL EXAM General: Well developed, well nourished, in no acute distress HEENT:  Normocephalic and atramatic Neck:  No JVD.  Lungs: Clear bilaterally to auscultation and percussion. Heart: HRRR . Normal S1 and S2 without gallops or murmurs.  Abdomen: Bowel  sounds are positive, abdomen soft and non-tender  Msk:  Back normal, normal gait. Normal strength and tone for age. Extremities: No clubbing, cyanosis or edema.   Neuro: Alert and oriented X 3. Psych:  Good affect, responds appropriately  TELEMETRY: Sinus rhythm  ASSESSMENT AND PLAN: Patient has high-grade lesion in the mid LAD as well as distal LAD with normal ejection fraction.  Advise proceeding with PCI and stenting because of anemia advise transfusing PRBC after the PCI.  Patient was having repeated chest pain at rest thus we will proceed with PCI.  Principal Problem:   Chest pain Active Problems:   Acute on chronic renal insufficiency   HTN (hypertension), benign   HLD (hyperlipidemia)   Aortic atherosclerosis (HCC)   Depression, major, in remission (HCC)   OSA (obstructive sleep apnea)   Chronic mesenteric ischemia (HCC)   Unstable angina (HCC)    Patrick Turner A, MD, [facility] 10/03/2019 8:35 AM

## 2019-10-04 ENCOUNTER — Encounter: Payer: Self-pay | Admitting: Cardiology

## 2019-10-04 DIAGNOSIS — D500 Iron deficiency anemia secondary to blood loss (chronic): Secondary | ICD-10-CM

## 2019-10-04 LAB — BASIC METABOLIC PANEL
Anion gap: 7 (ref 5–15)
BUN: 31 mg/dL — ABNORMAL HIGH (ref 8–23)
CO2: 22 mmol/L (ref 22–32)
Calcium: 8.4 mg/dL — ABNORMAL LOW (ref 8.9–10.3)
Chloride: 109 mmol/L (ref 98–111)
Creatinine, Ser: 1.63 mg/dL — ABNORMAL HIGH (ref 0.61–1.24)
GFR calc Af Amer: 47 mL/min — ABNORMAL LOW (ref >60)
GFR calc non Af Amer: 41 mL/min — ABNORMAL LOW (ref >60)
Glucose, Bld: 180 mg/dL — ABNORMAL HIGH (ref 70–99)
Potassium: 4.4 mmol/L (ref 3.5–5.1)
Sodium: 138 mmol/L (ref 135–145)

## 2019-10-04 LAB — CBC
HCT: 24.5 % — ABNORMAL LOW (ref 39.0–52.0)
Hemoglobin: 7.6 g/dL — ABNORMAL LOW (ref 13.0–17.0)
MCH: 25.1 pg — ABNORMAL LOW (ref 26.0–34.0)
MCHC: 31 g/dL (ref 30.0–36.0)
MCV: 80.9 fL (ref 80.0–100.0)
Platelets: 224 10*3/uL (ref 150–400)
RBC: 3.03 MIL/uL — ABNORMAL LOW (ref 4.22–5.81)
RDW: 16.4 % — ABNORMAL HIGH (ref 11.5–15.5)
WBC: 7.6 10*3/uL (ref 4.0–10.5)
nRBC: 0 % (ref 0.0–0.2)

## 2019-10-04 LAB — GLUCOSE, CAPILLARY
Glucose-Capillary: 162 mg/dL — ABNORMAL HIGH (ref 70–99)
Glucose-Capillary: 228 mg/dL — ABNORMAL HIGH (ref 70–99)
Glucose-Capillary: 112 mg/dL — ABNORMAL HIGH (ref 70–99)
Glucose-Capillary: 227 mg/dL — ABNORMAL HIGH (ref 70–99)

## 2019-10-04 LAB — SAMPLE TO BLOOD BANK

## 2019-10-04 MED ORDER — SODIUM CHLORIDE 0.9% IV SOLUTION: Freq: Once | INTRAVENOUS | Status: DC

## 2019-10-04 MED ORDER — METOPROLOL TARTRATE 25 MG PO TABS: 12.5000 mg | ORAL_TABLET | Freq: Two times a day (BID) | ORAL | 0 refills | Status: DC

## 2019-10-04 NOTE — Progress Notes (Signed)
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  Patient underwent cardiac catheterization on 10/03/2019.  Patient is a never smoker.     Dionisio David, MD (Primary)  Dionisio David, MD  Procedures 10/03/2019 Left Heart Cath and possible Coronary intervention  Conclusion    Prox RCA lesion is 60% stenosed. 1st Mrg lesion is 90% stenosed. Ost Cx to Prox Cx lesion is 45% stenosed. Mid LAD lesion is 70% stenosed. Dist LAD lesion is 80% stenosed.   PCI of the LAD/drug-eluting stenting.    Procedures  CORONARY STENT INTERVENTION  Conclusion 10/03/2019   Prox LAD to Mid LAD lesion is 80% stenosed. Dist LAD lesion is 90% stenosed. A drug-eluting stent was successfully placed using a STENT RESOLUTE ONYX 2.5X38. Post intervention, there is a 0% residual stenosis. A drug-eluting stent was successfully placed using a [place] 0.63K16. Post intervention, there is a 0% residual stenosis.   Conclusion Ad hoc PCI and stent mid and distal LAD lesions 80% long tubular mid lesion 90% distal focal irregular lesion Placement of DES stent to the mid LAD 2.5 x 38 mm resolute Onyx to 13 atm 80% reduced to 0% Placement of DES stent to distal LAD lesion 2.25 x 12 mm resolute Onyx 13 atm 90% reduced to 0% TIMI-3 flow maintained throughout the procedure Minx deployed No complications ________________________________________________________________________________ This Admission:   Principal Problem:   Chest pain Active Problems:   Anemia   Acute on chronic renal insufficiency   HTN (hypertension), benign   HLD (hyperlipidemia)   Aortic atherosclerosis (HCC)   Depression, major, in remission (HCC)   OSA  (obstructive sleep apnea)   Chronic mesenteric ischemia (HCC)   Unstable angina (HCC)   Coronary artery disease Status post 2 stents in LAD Continue aspirin and statin Continue Plavix Follow-up outpatient cardiology Continue metoprolol   Symptomatic anemia At the time of this note patient had been transfused a total of 2 units packed red cells.   No clear source of blood loss Hemoccult negative GI consulted No clear indication for endoscopy at this time Follow-up outpatient with GI for consideration for capsule endoscopy   NOTE:   This RN spoke with patient over the phone on the afternoon of 10/04/2019.  NOTE:  Physical Therapy has recommended outpatient physical therapy for patient.  This RN explained to patient that his cardiologist has referred him to outpatient Cardiac Rehab at [facility].  Overview of the program provided.  Patient was most concerned about the cost of the program.  Web designer with Cardiac Rehab department will check with his insurance to see what his co-pay will be.  Patient will be informed of his copay amount when the Cardiac Rehab dept calls to schedule his first appointment.   Patient agreeable to receiving a call from Cardiac Rehab in one to two weeks after discharge to schedule his first appointment.  Questions answered.     Roanna Epley, RN, BSN, [facility]  Cardiovascular & Pulmonary Nurse Navigator  Direct Line: [phone]  Department Phone #: [phone] Fax: [phone]  Email Address: [email or web address]

## 2019-10-04 NOTE — Progress Notes (Signed)
SUBJECTIVE: Patient denies any chest pain   Vitals:   10/03/19 2223 10/03/19 2242 10/04/19 0542 10/04/19 0738  BP: (!) 131/53 132/64 (!) 168/61 (!) 150/55  Pulse: 73 73 64 65  Resp: 18 18 16 18   Temp: 98.2 F (36.8 C) 98.1 F (36.7 C) 99 F (37.2 C) 98.6 F (37 C)  TempSrc: Oral Oral Oral   SpO2: 98% 97% 96% 96%  Weight:   85.6 kg   Height:        Intake/Output Summary (Last 24 hours) at 10/04/2019 0913 Last data filed at 10/03/2019 1900 Gross per 24 hour  Intake 844.72 ml  Output 700 ml  Net 144.72 ml    LABS: Basic Metabolic Panel: Recent Labs    10/03/19 0149 10/03/19 0149 10/03/19 1125 10/04/19 0636  NA 137   < > 136 138  K 4.3   < > 4.6 4.4  CL 108   < > 107 109  CO2 21*   < > 23 22  GLUCOSE 164*   < > 257* 180*  BUN 31*   < > 27* 31*  CREATININE 1.45*   < > 1.39* 1.63*  CALCIUM 8.5*   < > 8.2* 8.4*  MG 2.0  --   --   --    < > = values in this interval not displayed.   Liver Function Tests: No results for input(s): AST, ALT, ALKPHOS, BILITOT, PROT, ALBUMIN in the last 72 hours. No results for input(s): LIPASE, AMYLASE in the last 72 hours. CBC: Recent Labs    10/01/19 1918 10/03/19 0149 10/03/19 1125 10/03/19 1125 10/03/19 1914 10/04/19 0636  WBC 8.9   < > 5.6  --   --  7.6  NEUTROABS 6.5  --   --   --   --   --   HGB 8.0*   < > 6.9*   < > 8.3* 7.6*  HCT 26.4*   < > 23.2*   < > 26.9* 24.5*  MCV 81.2   < > 81.1  --   --  80.9  PLT 254   < > 240  --   --  224   < > = values in this interval not displayed.   Cardiac Enzymes: No results for input(s): CKTOTAL, CKMB, CKMBINDEX, TROPONINI in the last 72 hours. BNP: Invalid input(s): POCBNP D-Dimer: No results for input(s): DDIMER in the last 72 hours. Hemoglobin A1C: Recent Labs    10/01/19 2339  HGBA1C 7.8*   Fasting Lipid Panel: No results for input(s): CHOL, HDL, LDLCALC, TRIG, CHOLHDL, LDLDIRECT in the last 72 hours. Thyroid Function Tests: No results for input(s): TSH, T4TOTAL,  T3FREE, THYROIDAB in the last 72 hours.  Invalid input(s): FREET3 Anemia Panel: Recent Labs    10/03/19 1125  VITAMINB12 432  FOLATE 12.8  FERRITIN 9*  TIBC 391  IRON 17*  RETICCTPCT 1.2     PHYSICAL EXAM General: Well developed, well nourished, in no acute distress HEENT:  Normocephalic and atramatic Neck:  No JVD.  Lungs: Clear bilaterally to auscultation and percussion. Heart: HRRR . Normal S1 and S2 without gallops or murmurs.  Abdomen: Bowel sounds are positive, abdomen soft and non-tender  Msk:  Back normal, normal gait. Normal strength and tone for age. Extremities: No clubbing, cyanosis or edema.   Neuro: Alert and oriented X 3. Psych:  Good affect, responds appropriately  TELEMETRY: Sinus rhythm  ASSESSMENT AND PLAN: Status post PCI of mid and distal LAD with deployment of 2  drug-eluting stent and chest pain-free now.  Patient received blood transfusion yesterday and got his hemoglobin to 8.3 but this morning is back to 7.6 and creatinine is around 1.6 not much changed creatinine but hemoglobin dropped again.  Patient has been having physical therapy and walk around in the corridor and is feeling much better without chest pain.  If hemoglobin issue is resolved patient can be discharged with follow-up in the office Monday at 10:00.  Principal Problem:   Chest pain Active Problems:   Acute on chronic renal insufficiency   HTN (hypertension), benign   HLD (hyperlipidemia)   Aortic atherosclerosis (HCC)   Depression, major, in remission (HCC)   OSA (obstructive sleep apnea)   Chronic mesenteric ischemia (HCC)   Unstable angina (HCC)    Neoma Laming A, MD, [facility] 10/04/2019 9:13 AM

## 2019-10-04 NOTE — Progress Notes (Signed)
Patient c/o  Left upper chest pain 2/10. PRN Nitro SL 0.4mg  administered. Patient alert, responsive, denies any radiating of pain at this time . Will continue to monitor to ensure comfort and safety.

## 2019-10-04 NOTE — Discharge Instructions (Signed)
Managing Your Hypertension Hypertension is commonly called high blood pressure. This is when the force of your blood pressing against the walls of your arteries is too strong. Arteries are blood vessels that carry blood from your heart throughout your body. Hypertension forces the heart to work harder to pump blood, and may cause the arteries to become narrow or stiff. Having untreated or uncontrolled hypertension can cause heart attack, stroke, kidney disease, and other problems. What are blood pressure readings? A blood pressure reading consists of a higher number over a lower number. Ideally, your blood pressure should be below 120/80. The first ("top") number is called the systolic pressure. It is a measure of the pressure in your arteries as your heart beats. The second ("bottom") number is called the diastolic pressure. It is a measure of the pressure in your arteries as the heart relaxes. What does my blood pressure reading mean? Blood pressure is classified into four stages. Based on your blood pressure reading, your health care provider may use the following stages to determine what type of treatment you need, if any. Systolic pressure and diastolic pressure are measured in a unit called mm Hg. Normal  Systolic pressure: below 078.  Diastolic pressure: below 80. Elevated  Systolic pressure: 675-449.  Diastolic pressure: below 80. Hypertension stage 1  Systolic pressure: 201-007.  Diastolic pressure: 12-19. Hypertension stage 2  Systolic pressure: 758 or above.  Diastolic pressure: 90 or above. What health risks are associated with hypertension? Managing your hypertension is an important responsibility. Uncontrolled hypertension can lead to:  A heart attack.  A stroke.  A weakened blood vessel (aneurysm).  Heart failure.  Kidney damage.  Eye damage.  Metabolic syndrome.  Memory and concentration problems. What changes can I make to manage my  hypertension? Hypertension can be managed by making lifestyle changes and possibly by taking medicines. Your health care provider will help you make a plan to bring your blood pressure within a normal range. Eating and drinking   Eat a diet that is high in fiber and potassium, and low in salt (sodium), added sugar, and fat. An example eating plan is called the DASH (Dietary Approaches to Stop Hypertension) diet. To eat this way: ? Eat plenty of fresh fruits and vegetables. Try to fill half of your plate at each meal with fruits and vegetables. ? Eat whole grains, such as whole wheat pasta, brown rice, or whole grain bread. Fill about one quarter of your plate with whole grains. ? Eat low-fat diary products. ? Avoid fatty cuts of meat, processed or cured meats, and poultry with skin. Fill about one quarter of your plate with lean proteins such as fish, chicken without skin, beans, eggs, and tofu. ? Avoid premade and processed foods. These tend to be higher in sodium, added sugar, and fat.  Reduce your daily sodium intake. Most people with hypertension should eat less than 1,500 mg of sodium a day.  Limit alcohol intake to no more than 1 drink a day for nonpregnant women and 2 drinks a day for men. One drink equals 12 oz of beer, 5 oz of wine, or 1 oz of hard liquor. Lifestyle  Work with your health care provider to maintain a healthy body weight, or to lose weight. Ask what an ideal weight is for you.  Get at least 30 minutes of exercise that causes your heart to beat faster (aerobic exercise) most days of the week. Activities may include walking, swimming, or biking.  Include exercise  to strengthen your muscles (resistance exercise), such as weight lifting, as part of your weekly exercise routine. Try to do these types of exercises for 30 minutes at least 3 days a week.  Do not use any products that contain nicotine or tobacco, such as cigarettes and e-cigarettes. If you need help quitting,  ask your health care provider.  Control any long-term (chronic) conditions you have, such as high cholesterol or diabetes. Monitoring  Monitor your blood pressure at home as told by your health care provider. Your personal target blood pressure may vary depending on your medical conditions, your age, and other factors.  Have your blood pressure checked regularly, as often as told by your health care provider. Working with your health care provider  Review all the medicines you take with your health care provider because there may be side effects or interactions.  Talk with your health care provider about your diet, exercise habits, and other lifestyle factors that may be contributing to hypertension.  Visit your health care provider regularly. Your health care provider can help you create and adjust your plan for managing hypertension. Will I need medicine to control my blood pressure? Your health care provider may prescribe medicine if lifestyle changes are not enough to get your blood pressure under control, and if:  Your systolic blood pressure is 130 or higher.  Your diastolic blood pressure is 80 or higher. Take medicines only as told by your health care provider. Follow the directions carefully. Blood pressure medicines must be taken as prescribed. The medicine does not work as well when you skip doses. Skipping doses also puts you at risk for problems. Contact a health care provider if:  You think you are having a reaction to medicines you have taken.  You have repeated (recurrent) headaches.  You feel dizzy.  You have swelling in your ankles.  You have trouble with your vision. Get help right away if:  You develop a severe headache or confusion.  You have unusual weakness or numbness, or you feel faint.  You have severe pain in your chest or abdomen.  You vomit repeatedly.  You have trouble breathing. Summary  Hypertension is when the force of blood pumping  through your arteries is too strong. If this condition is not controlled, it may put you at risk for serious complications.  Your personal target blood pressure may vary depending on your medical conditions, your age, and other factors. For most people, a normal blood pressure is less than 120/80.  Hypertension is managed by lifestyle changes, medicines, or both. Lifestyle changes include weight loss, eating a healthy, low-sodium diet, exercising more, and limiting alcohol. This information is not intended to replace advice given to you by your health care provider. Make sure you discuss any questions you have with your health care provider. Document Revised: 12/21/2018 Document Reviewed: 07/27/2016 Elsevier Patient Education  [place].   Hypertension, Adult Hypertension is another name for high blood pressure. High blood pressure forces your heart to work harder to pump blood. This can cause problems over time. There are two numbers in a blood pressure reading. There is a top number (systolic) over a bottom number (diastolic). It is best to have a blood pressure that is below 120/80. Healthy choices can help lower your blood pressure, or you may need medicine to help lower it. What are the causes? The cause of this condition is not known. Some conditions may be related to high blood pressure. What increases the  risk?  Smoking.  Having type 2 diabetes mellitus, high cholesterol, or both.  Not getting enough exercise or physical activity.  Being overweight.  Having too much fat, sugar, calories, or salt (sodium) in your diet.  Drinking too much alcohol.  Having long-term (chronic) kidney disease.  Having a family history of high blood pressure.  Age. Risk increases with age.  Race. You may be at higher risk if you are African American.  Gender. Men are at higher risk than women before age 60. After age 72, women are at higher risk than men.  Having obstructive sleep  apnea.  Stress. What are the signs or symptoms?  High blood pressure may not cause symptoms. Very high blood pressure (hypertensive crisis) may cause: ? Headache. ? Feelings of worry or nervousness (anxiety). ? Shortness of breath. ? Nosebleed. ? A feeling of being sick to your stomach (nausea). ? Throwing up (vomiting). ? Changes in how you see. ? Very bad chest pain. ? Seizures. How is this treated?  This condition is treated by making healthy lifestyle changes, such as: ? Eating healthy foods. ? Exercising more. ? Drinking less alcohol.  Your health care provider may prescribe medicine if lifestyle changes are not enough to get your blood pressure under control, and if: ? Your top number is above 130. ? Your bottom number is above 80.  Your personal target blood pressure may vary. Follow these instructions at home: Eating and drinking   If told, follow the DASH eating plan. To follow this plan: ? Fill one half of your plate at each meal with fruits and vegetables. ? Fill one fourth of your plate at each meal with whole grains. Whole grains include whole-wheat pasta, brown rice, and whole-grain bread. ? Eat or drink low-fat dairy products, such as skim milk or low-fat yogurt. ? Fill one fourth of your plate at each meal with low-fat (lean) proteins. Low-fat proteins include fish, chicken without skin, eggs, beans, and tofu. ? Avoid fatty meat, cured and processed meat, or chicken with skin. ? Avoid pre-made or processed food.  Eat less than 1,500 mg of salt each day.  Do not drink alcohol if: ? Your doctor tells you not to drink. ? You are pregnant, may be pregnant, or are planning to become pregnant.  If you drink alcohol: ? Limit how much you use to:  0-1 drink a day for women.  0-2 drinks a day for men. ? Be aware of how much alcohol is in your drink. In the [place] one drink equals one 12 oz bottle of beer (355 mL), one 5 oz glass of wine (148 mL), or one 1 oz  glass of hard liquor (44 mL). Lifestyle   Work with your doctor to stay at a healthy weight or to lose weight. Ask your doctor what the best weight is for you.  Get at least 30 minutes of exercise most days of the week. This may include walking, swimming, or biking.  Get at least 30 minutes of exercise that strengthens your muscles (resistance exercise) at least 3 days a week. This may include lifting weights or doing Pilates.  Do not use any products that contain nicotine or tobacco, such as cigarettes, e-cigarettes, and chewing tobacco. If you need help quitting, ask your doctor.  Check your blood pressure at home as told by your doctor.  Keep all follow-up visits as told by your doctor. This is important. Medicines  Take over-the-counter and prescription medicines only as told  by your doctor. Follow directions carefully.  Do not skip doses of blood pressure medicine. The medicine does not work as well if you skip doses. Skipping doses also puts you at risk for problems.  Ask your doctor about side effects or reactions to medicines that you should watch for. Contact a doctor if you:  Think you are having a reaction to the medicine you are taking.  Have headaches that keep coming back (recurring).  Feel dizzy.  Have swelling in your ankles.  Have trouble with your vision. Get help right away if you:  Get a very bad headache.  Start to feel mixed up (confused).  Feel weak or numb.  Feel faint.  Have very bad pain in your: ? Chest. ? Belly (abdomen).  Throw up more than once.  Have trouble breathing. Summary  Hypertension is another name for high blood pressure.  High blood pressure forces your heart to work harder to pump blood.  For most people, a normal blood pressure is less than 120/80.  Making healthy choices can help lower blood pressure. If your blood pressure does not get lower with healthy choices, you may need to take medicine. This information is  not intended to replace advice given to you by your health care provider. Make sure you discuss any questions you have with your health care provider. Document Revised: 05/09/2018 Document Reviewed: 05/09/2018 Elsevier Patient Education  [place].   Chest Wall Pain Chest wall pain is pain in or around the bones and muscles of your chest. Chest wall pain may be caused by:  An injury.  Coughing a lot.  Using your chest and arm muscles too much. Sometimes, the cause may not be known. This pain may take a few weeks or longer to get better. Follow these instructions at home: Managing pain, stiffness, and swelling If told, put ice on the painful area:  Put ice in a plastic bag.  Place a towel between your skin and the bag.  Leave the ice on for 20 minutes, 2-3 times a day.  Activity  Rest as told by your doctor.  Avoid doing things that cause pain. This includes lifting heavy items.  Ask your doctor what activities are safe for you. General instructions   Take over-the-counter and prescription medicines only as told by your doctor.  Do not use any products that contain nicotine or tobacco, such as cigarettes, e-cigarettes, and chewing tobacco. If you need help quitting, ask your doctor.  Keep all follow-up visits as told by your doctor. This is important. Contact a doctor if:  You have a fever.  Your chest pain gets worse.  You have new symptoms. Get help right away if:  You feel sick to your stomach (nauseous) or you throw up (vomit).  You feel sweaty or light-headed.  You have a cough with mucus from your lungs (sputum) or you cough up blood.  You are short of breath. These symptoms may be an emergency. Do not wait to see if the symptoms will go away. Get medical help right away. Call your local emergency services (911 in the [place] Do not drive yourself to the hospital. Summary  Chest wall pain is pain in or around the bones and muscles of your  chest.  It may be treated with ice, rest, and medicines. Your condition may also get better if you avoid doing things that cause pain.  Contact a doctor if you have a fever, chest pain that gets worse, or  new symptoms.  Get help right away if you feel light-headed or you get short of breath. These symptoms may be an emergency. This information is not intended to replace advice given to you by your health care provider. Make sure you discuss any questions you have with your health care provider. Document Revised: 03/01/2018 Document Reviewed: 03/01/2018 Elsevier Patient Education  [place].   Chest Wall Pain Chest wall pain is pain in or around the bones and muscles of your chest. Sometimes, an injury causes this pain. Excessive coughing or overuse of arm and chest muscles may also cause chest wall pain. Sometimes, the cause may not be known. This pain may take several weeks or longer to get better. Follow these instructions at home: Managing pain, stiffness, and swelling   If directed, put ice on the painful area: ? Put ice in a plastic bag. ? Place a towel between your skin and the bag. ? Leave the ice on for 20 minutes, 2-3 times per day. Activity  Rest as told by your health care provider.  Avoid activities that cause pain. These include any activities that use your chest muscles or your abdominal and side muscles to lift heavy items. Ask your health care provider what activities are safe for you. General instructions   Take over-the-counter and prescription medicines only as told by your health care provider.  Do not use any products that contain nicotine or tobacco, such as cigarettes, e-cigarettes, and chewing tobacco. These can delay healing after injury. If you need help quitting, ask your health care provider.  Keep all follow-up visits as told by your health care provider. This is important. Contact a health care provider if:  You have a fever.  Your chest  pain becomes worse.  You have new symptoms. Get help right away if:  You have nausea or vomiting.  You feel sweaty or light-headed.  You have a cough with mucus from your lungs (sputum) or you cough up blood.  You develop shortness of breath. These symptoms may represent a serious problem that is an emergency. Do not wait to see if the symptoms will go away. Get medical help right away. Call your local emergency services (911 in the [place] Do not drive yourself to the hospital. Summary  Chest wall pain is pain in or around the bones and muscles of your chest.  Depending on the cause, it may be treated with ice, rest, medicines, and avoiding activities that cause pain.  Contact a health care provider if you have a fever, worsening chest pain, or new symptoms.  Get help right away if you feel light-headed or you develop shortness of breath. These symptoms may be an emergency. This information is not intended to replace advice given to you by your health care provider. Make sure you discuss any questions you have with your health care provider. Document Revised: 03/01/2018 Document Reviewed: 03/01/2018 Elsevier Patient Education  [place].   Diabetic Neuropathy Diabetic neuropathy refers to nerve damage that is caused by diabetes (diabetes mellitus). Over time, people with diabetes can develop nerve damage throughout the body. There are several types of diabetic neuropathy:  Peripheral neuropathy. This is the most common type of diabetic neuropathy. It causes damage to nerves that carry signals between the spinal cord and other parts of the body (peripheral nerves). This usually affects nerves in the feet and legs first, and may eventually affect the hands and arms. The damage affects the ability to sense touch  or temperature.  Autonomic neuropathy. This type causes damage to nerves that control involuntary functions (autonomic nerves). These nerves carry signals that  control: ? Heartbeat. ? Body temperature. ? Blood pressure. ? Urination. ? Digestion. ? Sweating. ? Sexual function. ? Response to changing blood sugar (glucose) levels.  Focal neuropathy. This type of nerve damage affects one area of the body, such as an arm, a leg, or the face. The injury may involve one nerve or a small group of nerves. Focal neuropathy can be painful and unpredictable, and occurs most often in older adults with diabetes. This often develops suddenly, but usually improves over time and does not cause long-term problems.  Proximal neuropathy. This type of nerve damage affects the nerves of the thighs, hips, buttocks, or legs. It causes severe pain, weakness, and muscle death (atrophy), usually in the thigh muscles. It is more common among older men and people who have type 2 diabetes. The length of recovery time may vary. What are the causes? Peripheral, autonomic, and focal neuropathies are caused by diabetes that is not well controlled with treatment. The cause of proximal neuropathy is not known, but it may be caused by inflammation related to uncontrolled blood glucose levels. What are the signs or symptoms? Peripheral neuropathy Peripheral neuropathy develops slowly over time. When the nerves of the feet and legs no longer work, you may experience:  Burning, stabbing, or aching pain in the legs or feet.  Pain or cramping in the legs or feet.  Loss of feeling (numbness) and inability to feel pressure or pain in the feet. This can lead to: ? Thick calluses or sores on areas of constant pressure. ? Ulcers. ? Reduced ability to feel temperature changes.  Foot deformities.  Muscle weakness.  Loss of balance or coordination. Autonomic neuropathy The symptoms of autonomic neuropathy vary depending on which nerves are affected. Symptoms may include:  Problems with digestion, such as: ? Nausea or vomiting. ? Poor appetite. ? Bloating. ? Diarrhea or  constipation. ? Trouble swallowing. ? Losing weight without trying to.  Problems with the heart, blood and lungs, such as: ? Dizziness, especially when standing up. ? Fainting. ? Shortness of breath. ? Irregular heartbeat.  Bladder problems, such as: ? Trouble starting or stopping urination. ? Leaking urine. ? Trouble emptying the bladder. ? Urinary tract infections (UTIs).  Problems with other body functions, such as: ? Sweat. You may sweat too much or too little. ? Temperature. You might get hot easily. Or, you might feel cold more than usual. ? Sexual function. Men may not be able to get or maintain an erection. Women may have vaginal dryness and difficulty with arousal. Focal neuropathy Symptoms affect only one area of the body. Common symptoms include:  Numbness.  Tingling.  Burning pain.  Prickling feeling.  Very sensitive skin.  Weakness.  Inability to move (paralysis).  Muscle twitching.  Muscles getting smaller (wasting).  Poor coordination.  Double or blurred vision. Proximal neuropathy  Sudden, severe pain in the hip, thigh, or buttocks. Pain may spread from the back into the legs (sciatica).  Pain and numbness in the arms and legs.  Tingling.  Loss of bladder control or bowel control.  Weakness and wasting of thigh muscles.  Difficulty getting up from a seated position.  Abdominal swelling.  Unexplained weight loss. How is this diagnosed? Diagnosis usually involves reviewing your medical history and any symptoms you have. Diagnosis varies depending on the type of neuropathy your health care provider suspects.  Peripheral neuropathy Your health care provider will check areas that are affected by your nervous system (neurologic exam), such as your reflexes, how you move, and what you can feel. You may have other tests, such as:  Blood tests.  Removal and examination of fluid that surrounds the spinal cord (lumbar puncture).  CT  scan.  MRI.  A test to check the nerves that control muscles (electromyogram, EMG).  Tests of how quickly messages pass through your nerves (nerve conduction velocity tests).  Removal of a small piece of nerve to be examined under a microscope (biopsy). Autonomic neuropathy You may have tests, such as:  Tests to measure your blood pressure and heart rate. This may include monitoring you while you are safely secured to an exam table that moves you from a lying position to an upright position (table tilt test).  Breathing tests to check your lungs.  Tests to check how food moves through the digestive system (gastric emptying tests).  Blood, sweat, or urine tests.  Ultrasound of your bladder.  Spinal fluid tests. Focal neuropathy This condition may be diagnosed with:  A neurologic exam.  CT scan.  MRI.  EMG.  Nerve conduction velocity tests. Proximal neuropathy There is no test to diagnose this type of neuropathy. You may have tests to rule out other possible causes of this type of neuropathy. Tests may include:  X-rays of your spine and lumbar region.  Lumbar puncture.  MRI. How is this treated? The goal of treatment is to keep nerve damage from getting worse. The most important part of treatment is keeping your blood glucose level and your A1C level within your target range by following your diabetes management plan. Over time, maintaining lower blood glucose levels helps lessen symptoms. In some cases, you may need prescription pain medicine. Follow these instructions at home:  Lifestyle   Do not use any products that contain nicotine or tobacco, such as cigarettes and e-cigarettes. If you need help quitting, ask your health care provider.  Be physically active every day. Include strength training and balance exercises.  Follow a healthy meal plan.  Work with your health care provider to manage your blood pressure. General instructions  Follow your diabetes  management plan as directed. ? Check your blood glucose levels as directed by your health care provider. ? Keep your blood glucose in your target range as directed by your health care provider. ? Have your A1C level checked at least two times a year, or as often as told by your health care provider.  Take over the counter and prescription medicines only as told by your health care provider. This includes insulin and diabetes medicine.  Do not drive or use heavy machinery while taking prescription pain medicines.  Check your skin and feet every day for cuts, bruises, redness, blisters, or sores.  Keep all follow up visits as told by your health care provider. This is important. Contact a health care provider if:  You have burning, stabbing, or aching pain in your legs or feet.  You are unable to feel pressure or pain in your feet.  You develop problems with digestion, such as: ? Nausea. ? Vomiting. ? Bloating. ? Constipation. ? Diarrhea. ? Abdominal pain.  You have difficulty with urination, such as inability: ? To control when you urinate (incontinence). ? To completely empty the bladder (retention).  You have palpitations.  You feel dizzy, weak, or faint when you stand up. Get help right away if:  You cannot  urinate.  You have sudden weakness or loss of coordination.  You have trouble speaking.  You have pain or pressure in your chest.  You have an irregular heart beat.  You have sudden inability to move a part of your body. Summary  Diabetic neuropathy refers to nerve damage that is caused by diabetes. It can affect nerves throughout the entire body, causing numbness and pain in the arms, legs, digestive tract, heart, and other body systems.  Keep your blood glucose level and your blood pressure in your target range, as directed by your health care provider. This can help prevent neuropathy from getting worse.  Check your skin and feet every day for cuts,  bruises, redness, blisters, or sores.  Do not use any products that contain nicotine or tobacco, such as cigarettes and e-cigarettes. If you need help quitting, ask your health care provider. This information is not intended to replace advice given to you by your health care provider. Make sure you discuss any questions you have with your health care provider. Document Revised: 10/11/2017 Document Reviewed: 10/03/2016 Elsevier Patient Education  [place].   Hypertension, Adult High blood pressure (hypertension) is when the force of blood pumping through the arteries is too strong. The arteries are the blood vessels that carry blood from the heart throughout the body. Hypertension forces the heart to work harder to pump blood and may cause arteries to become narrow or stiff. Untreated or uncontrolled hypertension can cause a heart attack, heart failure, a stroke, kidney disease, and other problems. A blood pressure reading consists of a higher number over a lower number. Ideally, your blood pressure should be below 120/80. The first ("top") number is called the systolic pressure. It is a measure of the pressure in your arteries as your heart beats. The second ("bottom") number is called the diastolic pressure. It is a measure of the pressure in your arteries as the heart relaxes. What are the causes? The exact cause of this condition is not known. There are some conditions that result in or are related to high blood pressure. What increases the risk? Some risk factors for high blood pressure are under your control. The following factors may make you more likely to develop this condition:  Smoking.  Having type 2 diabetes mellitus, high cholesterol, or both.  Not getting enough exercise or physical activity.  Being overweight.  Having too much fat, sugar, calories, or salt (sodium) in your diet.  Drinking too much alcohol. Some risk factors for high blood pressure may be difficult  or impossible to change. Some of these factors include:  Having chronic kidney disease.  Having a family history of high blood pressure.  Age. Risk increases with age.  Race. You may be at higher risk if you are African American.  Gender. Men are at higher risk than women before age 8. After age 68, women are at higher risk than men.  Having obstructive sleep apnea.  Stress. What are the signs or symptoms? High blood pressure may not cause symptoms. Very high blood pressure (hypertensive crisis) may cause:  Headache.  Anxiety.  Shortness of breath.  Nosebleed.  Nausea and vomiting.  Vision changes.  Severe chest pain.  Seizures. How is this diagnosed? This condition is diagnosed by measuring your blood pressure while you are seated, with your arm resting on a flat surface, your legs uncrossed, and your feet flat on the floor. The cuff of the blood pressure monitor will be placed directly against  the skin of your upper arm at the level of your heart. It should be measured at least twice using the same arm. Certain conditions can cause a difference in blood pressure between your right and left arms. Certain factors can cause blood pressure readings to be lower or higher than normal for a short period of time:  When your blood pressure is higher when you are in a health care provider's office than when you are at home, this is called white coat hypertension. Most people with this condition do not need medicines.  When your blood pressure is higher at home than when you are in a health care provider's office, this is called masked hypertension. Most people with this condition may need medicines to control blood pressure. If you have a high blood pressure reading during one visit or you have normal blood pressure with other risk factors, you may be asked to:  Return on a different day to have your blood pressure checked again.  Monitor your blood pressure at home for 1 week or  longer. If you are diagnosed with hypertension, you may have other blood or imaging tests to help your health care provider understand your overall risk for other conditions. How is this treated? This condition is treated by making healthy lifestyle changes, such as eating healthy foods, exercising more, and reducing your alcohol intake. Your health care provider may prescribe medicine if lifestyle changes are not enough to get your blood pressure under control, and if:  Your systolic blood pressure is above 130.  Your diastolic blood pressure is above 80. Your personal target blood pressure may vary depending on your medical conditions, your age, and other factors. Follow these instructions at home: Eating and drinking   Eat a diet that is high in fiber and potassium, and low in sodium, added sugar, and fat. An example eating plan is called the DASH (Dietary Approaches to Stop Hypertension) diet. To eat this way: ? Eat plenty of fresh fruits and vegetables. Try to fill one half of your plate at each meal with fruits and vegetables. ? Eat whole grains, such as whole-wheat pasta, brown rice, or whole-grain bread. Fill about one fourth of your plate with whole grains. ? Eat or drink low-fat dairy products, such as skim milk or low-fat yogurt. ? Avoid fatty cuts of meat, processed or cured meats, and poultry with skin. Fill about one fourth of your plate with lean proteins, such as fish, chicken without skin, beans, eggs, or tofu. ? Avoid pre-made and processed foods. These tend to be higher in sodium, added sugar, and fat.  Reduce your daily sodium intake. Most people with hypertension should eat less than 1,500 mg of sodium a day.  Do not drink alcohol if: ? Your health care provider tells you not to drink. ? You are pregnant, may be pregnant, or are planning to become pregnant.  If you drink alcohol: ? Limit how much you use to:  0-1 drink a day for women.  0-2 drinks a day for  men. ? Be aware of how much alcohol is in your drink. In the [place] one drink equals one 12 oz bottle of beer (355 mL), one 5 oz glass of wine (148 mL), or one 1 oz glass of hard liquor (44 mL). Lifestyle   Work with your health care provider to maintain a healthy body weight or to lose weight. Ask what an ideal weight is for you.  Get at least 30 minutes of  exercise most days of the week. Activities may include walking, swimming, or biking.  Include exercise to strengthen your muscles (resistance exercise), such as Pilates or lifting weights, as part of your weekly exercise routine. Try to do these types of exercises for 30 minutes at least 3 days a week.  Do not use any products that contain nicotine or tobacco, such as cigarettes, e-cigarettes, and chewing tobacco. If you need help quitting, ask your health care provider.  Monitor your blood pressure at home as told by your health care provider.  Keep all follow-up visits as told by your health care provider. This is important. Medicines  Take over-the-counter and prescription medicines only as told by your health care provider. Follow directions carefully. Blood pressure medicines must be taken as prescribed.  Do not skip doses of blood pressure medicine. Doing this puts you at risk for problems and can make the medicine less effective.  Ask your health care provider about side effects or reactions to medicines that you should watch for. Contact a health care provider if you:  Think you are having a reaction to a medicine you are taking.  Have headaches that keep coming back (recurring).  Feel dizzy.  Have swelling in your ankles.  Have trouble with your vision. Get help right away if you:  Develop a severe headache or confusion.  Have unusual weakness or numbness.  Feel faint.  Have severe pain in your chest or abdomen.  Vomit repeatedly.  Have trouble breathing. Summary  Hypertension is when the force of blood  pumping through your arteries is too strong. If this condition is not controlled, it may put you at risk for serious complications.  Your personal target blood pressure may vary depending on your medical conditions, your age, and other factors. For most people, a normal blood pressure is less than 120/80.  Hypertension is treated with lifestyle changes, medicines, or a combination of both. Lifestyle changes include losing weight, eating a healthy, low-sodium diet, exercising more, and limiting alcohol. This information is not intended to replace advice given to you by your health care provider. Make sure you discuss any questions you have with your health care provider. Document Revised: 05/09/2018 Document Reviewed: 05/09/2018 Elsevier Patient Education  2020 [employer].

## 2019-10-04 NOTE — Evaluation (Signed)
Physical Therapy Evaluation Patient Details Name: Patrick Turner MRN: [id number] DOB: April 03, 1946 Today's Date: 10/04/2019   History of Present Illness  74 y.o. 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   Clinical Impression  Pt was able to ambulate around the nurses station and negotiate up/down steps but did have regular small stagger steps (with no near falls, etc).  He did clearly need the rails on the steps using step-to pattern.  Pt's O2 remained in the 90s t/o the effort but did stay in the low 90s for the last 1/2 of ambulation.  He reports he has had multiple recent falls, encouraged him to use [facility] for now and he is open to going to outpt PT to work on balance and try to make other functional improvements.    Follow Up Recommendations Outpatient PT    Equipment Recommendations  (encouraged pt to use his walker)    Recommendations for Other Services       Precautions / Restrictions Precautions Precautions: Fall Restrictions Weight Bearing Restrictions: No      Mobility  Bed Mobility Overal bed mobility: Independent             General bed mobility comments: Pt was able to get to EOB w/o issue, did need heavy UE use on the hand rails.  Transfers Overall transfer level: Modified independent               General transfer comment: Pt was able to rise to standing w/o assist, did need some time to reset and maintain balance when he initially got to standing   Ambulation/Gait Ambulation/Gait assistance: Min guard Gait Distance (Feet): 250 Feet Assistive device: None       General Gait Details: Pt was able to ambulate around the unit with relatively consistent cadence though he did have some c/o R>L thigh pain and though there was no overt bucking he  had mild but frequent stagger stepping that was never a near fall but was consistent enough to cause PT to encourage cane use moving forward.  Stairs Stairs: Yes Stairs assistance: Min guard Stair Management: One rail Right Number of Stairs: 13 General stair comments: Pt was able to ascend steps with step-to strategy  Wheelchair Mobility    Modified Rankin (Stroke Patients Only)       Balance Overall balance assessment: Modified Independent                                           Pertinent Vitals/Pain Pain Assessment: No/denies pain    Home Living Family/patient expects to be discharged to:: Private residence Living Arrangements: Spouse/significant other Available Help at Discharge: Available 24 hours/day(wife works from home) Type of Home: House Home Access: Stairs to enter Entrance Stairs-Rails: None(apparently old ones were rotting, replacement was delayed) Technical brewer of Steps: 3 Home Layout: Two level;Bed/bath upstairs Home Equipment: Walker - 2 wheels;Cane - single point;Shower seat      Prior Function Level of Independence: Independent         Comments: Pt working full time at [employer] Pt with decreased ability to recall bathroom equipment/set up, information verified from previous admissions.  Reports he has had numerous falls in  the last few months     Hand Dominance   Dominant Hand: Right    Extremity/Trunk Assessment   Upper Extremity Assessment Upper Extremity Assessment: Generalized weakness;Overall [facility] for tasks assessed    Lower Extremity Assessment Lower Extremity Assessment: Generalized weakness;Overall [facility] for tasks assessed       Communication   Communication: No difficulties(at times had delayed response, hesitancy with vocalizations)  Cognition Arousal/Alertness: Awake/alert Behavior During Therapy: WFL for tasks assessed/performed Overall Cognitive Status: Within Functional Limits for tasks  assessed                                        General Comments      Exercises     Assessment/Plan    PT Assessment Patient needs continued PT services  PT Problem List Decreased strength;Decreased knowledge of use of DME;Decreased safety awareness;Decreased activity tolerance;Decreased balance;Decreased mobility       PT Treatment Interventions DME instruction;Gait training;Stair training;Functional mobility training;Therapeutic activities;Therapeutic exercise;Balance training;Cognitive remediation;Patient/family education    PT Goals (Current goals can be found in the Care Plan section)  Acute Rehab PT Goals Patient Stated Goal: go home PT Goal Formulation: With patient Time For Goal Achievement: 10/18/19 Potential to Achieve Goals: Fair    Frequency Min 2X/week   Barriers to discharge        Co-evaluation               AM-PAC PT "6 Clicks" Mobility  Outcome Measure Help needed turning from your back to your side while in a flat bed without using bedrails?: None Help needed moving from lying on your back to sitting on the side of a flat bed without using bedrails?: None Help needed moving to and from a bed to a chair (including a wheelchair)?: A Little Help needed standing up from a chair using your arms (e.g., wheelchair or bedside chair)?: None Help needed to walk in hospital room?: A Little Help needed climbing 3-5 steps with a railing? : A Little 6 Click Score: 21    End of Session Equipment Utilized During Treatment: Gait belt Activity Tolerance: Patient tolerated treatment well;Patient limited by fatigue Patient left: with chair alarm set;with call bell/phone within reach Nurse Communication: Mobility status PT Visit Diagnosis: Muscle weakness (generalized) (M62.81);Difficulty in walking, not elsewhere classified (R26.2);Unsteadiness on feet (R26.81);History of falling (Z91.81)    Time: [id number] PT Time Calculation (min) (ACUTE  ONLY): 27 min   Charges:   PT Evaluation $PT Eval Low Complexity: 1 Low PT Treatments $Gait Training: 8-22 mins        Kreg Shropshire, DPT 10/04/2019, 11:34 AM

## 2019-10-04 NOTE — Progress Notes (Signed)
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  Patient was working at [employer]	[place] and helping customers over the counter when around 6:00PMhe noted sudden onset focal pressure left-sided chest pain. Also had associated dizziness. He felt like he was going to "fall over." He then sat down but did not have any improvement in his symptoms. Denies any associated shortness of breath. No diaphoresis. No nausea or vomiting. Denies any previous history of chest pain or significant cardiac history. 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 Has had some coughing productive of sputum and runny nose but no fevers. He recently had a fall 2 days ago going up the steps since he went"toofast"and did not have guardrails tohold onto. He hit his head and left shoulder but denies any loss of consciousness. States his pain is different from his shoulder pain.He denies any tobacco, alcohol illicit drug use. Family history significant for mother who has heart disease. Chest pain now resolved in the ED after getting '4mg'$  of morphine, aspirin and SQ nitro.  Hospital Course:  Pt was continuing to have chest pain and cardiology consulted  And started on heparin drip per acs protocol and pt had left heart cath with 2 stents placed in LAD. Pt today is doing well and feeling much better. Ask when he can go home.    Assessment & Plan:   Principal Problem:   Chest pain Active Problems:   Anemia   Acute on chronic renal insufficiency   HTN (hypertension), benign  HLD (hyperlipidemia)   Aortic atherosclerosis (HCC)   Depression, major, in remission (HCC)   OSA (obstructive sleep apnea)   Chronic mesenteric ischemia (HCC)   Unstable angina ([place])  -s/o 2 stents in LAD. -cont asa/plavix and statin for stent. -pt's h/h on admission was low with negative stool occult . - pt was transfused and his hemoglobin is declining again GI consulted and dr.toledo to see pt for anemia for possible gi blood loss as pt need antiplatelet regimen for is stents. Pt denies any chest pressure or discomfort. -cont metoprolol for htn. -continue statin therapy for atherosclerosis. -h/o mesenteric ischemia pt is asymptomatic we will cont with medical optimzation. -cont ssi for his diabetes   DVT prophylaxis: asa /plavix (Lovenox/Heparin/SCD's/anticoagulated/None (if comfort care) Code Status: Full (Full/Partial - specify details) Family Communication: none (Specify name, relationship & date discussed. NO "discussed with patient") Disposition Plan:  D/c home(specify when and where you expect patient to be discharged). Include barriers to DC in this tab.   Consultants:   Card[facility] [place]. / GI Dr.Toledo.  Procedures:Left Heart Cath.   Subjective: Pt is comfortable and denies any complaints.  Objective: Vitals:   10/03/19 2242 10/04/19 0542 10/04/19 0738 10/04/19 1534  BP: 132/64 (!) 168/61 (!) 150/55 (!) 161/63  Pulse: 73 64 65 64  Resp: '18 16 18 18  '$ Temp: 98.1 F (36.7 C) 99 F (37.2 C) 98.6 F (37 C) 98.4 F (36.9 C)  TempSrc: Oral Oral    SpO2: 97% 96% 96% 100%  Weight:  85.6 kg    Height:  Intake/Output Summary (Last 24 hours) at 10/04/2019 1840 Last data filed at 10/04/2019 1700 Gross per 24 hour  Intake 1200 ml  Output --  Net 1200 ml   Filed Weights   10/01/19 1913 10/02/19 1118 10/04/19 0542  Weight: 83.5 kg 86 kg 85.6 kg    Examination:  General exam: Appears calm and comfortable  Respiratory system: Clear to  auscultation. Respiratory effort normal. Cardiovascular system: S1 & S2 heard, RRR. No JVD, murmurs, rubs, gallops or clicks. No pedal edema. Gastrointestinal system: Abdomen is nondistended, soft and nontender. No organomegaly or masses felt. Normal bowel sounds heard. Central nervous system: Alert and oriented. No focal neurological deficits. Extremities: Symmetric 5 x 5 power. Skin: No rashes, lesions or ulcers Psychiatry: Judgement and insight appear normal. Mood & affect appropriate.     Data Reviewed: I have personally reviewed following labs and imaging studies  CBC: Recent Labs  Lab 10/01/19 1918 10/03/19 0149 10/03/19 1125 10/03/19 1914 10/04/19 0636  WBC 8.9 6.6 5.6  --  7.6  NEUTROABS 6.5  --   --   --   --   HGB 8.0* 6.7* 6.9* 8.3* 7.6*  HCT 26.4* 22.0* 23.2* 26.9* 24.5*  MCV 81.2 79.4* 81.1  --  80.9  PLT 254 221 240  --  696   Basic Metabolic Panel: Recent Labs  Lab 10/01/19 1918 10/03/19 0149 10/03/19 1125 10/04/19 0636  NA 139 137 136 138  K 4.8 4.3 4.6 4.4  CL 106 108 107 109  CO2 24 21* 23 22  GLUCOSE 108* 164* 257* 180*  BUN 35* 31* 27* 31*  CREATININE 1.62* 1.45* 1.39* 1.63*  CALCIUM 8.7* 8.5* 8.2* 8.4*  MG  --  2.0  --   --    GFR: Estimated Creatinine Clearance: 40 mL/min (A) (by C-G formula based on SCr of 1.63 mg/dL (H)). Liver Function Tests: No results for input(s): AST, ALT, ALKPHOS, BILITOT, PROT, ALBUMIN in the last 168 hours. No results for input(s): LIPASE, AMYLASE in the last 168 hours. No results for input(s): AMMONIA in the last 168 hours. Coagulation Profile: Recent Labs  Lab 10/02/19 1316 10/03/19 1125  INR 1.1 1.6*   Cardiac Enzymes: No results for input(s): CKTOTAL, CKMB, CKMBINDEX, TROPONINI in the last 168 hours. BNP (last 3 results) No results for input(s): PROBNP in the last 8760 hours. HbA1C: Recent Labs    10/01/19 2339  HGBA1C 7.8*   CBG: Recent Labs  Lab 10/03/19 1630 10/03/19 2101 10/04/19 0800  10/04/19 1133 10/04/19 1658  GLUCAP 137* 232* 162* 228* 112*   Lipid Profile: No results for input(s): CHOL, HDL, LDLCALC, TRIG, CHOLHDL, LDLDIRECT in the last 72 hours. Thyroid Function Tests: No results for input(s): TSH, T4TOTAL, FREET4, T3FREE, THYROIDAB in the last 72 hours. Anemia Panel: Recent Labs    10/03/19 1125  VITAMINB12 432  FOLATE 12.8  FERRITIN 9*  TIBC 391  IRON 17*  RETICCTPCT 1.2   Sepsis Labs: No results for input(s): PROCALCITON, LATICACIDVEN in the last 168 hours.  Recent Results (from the past 240 hour(s))  SARS CORONAVIRUS 2 (TAT 6-24 HRS) Nasopharyngeal Nasopharyngeal Swab     Status: None   Collection Time: 10/01/19 10:57 PM   Specimen: Nasopharyngeal Swab  Result Value Ref Range Status   SARS Coronavirus 2 NEGATIVE NEGATIVE Final    Comment: (NOTE) SARS-CoV-2 target nucleic acids are NOT DETECTED. The SARS-CoV-2 RNA is generally detectable in upper and lower respiratory specimens during the acute phase of infection. Negative results do  not preclude SARS-CoV-2 infection, do not rule out co-infections with other pathogens, and should not be used as the sole basis for treatment or other patient management decisions. Negative results must be combined with clinical observations, patient history, and epidemiological information. The expected result is Negative. Fact Sheet for Patients: SugarRoll.be Fact Sheet for Healthcare Providers: https://www.woods-mathews.com/ This test is not yet approved or cleared by the [place] FDA and  has been authorized for detection and/or diagnosis of SARS-CoV-2 by FDA under an Emergency Use Authorization (EUA). This EUA will remain  in effect (meaning this test can be used) for the duration of the COVID-19 declaration under Section 56 4(b)(1) of the Act, 21 U.S.C. section 360bbb-3(b)(1), unless the authorization is terminated or revoked sooner. Performed at [facility], [place] [address] [place], [place] [address]          Radiology Studies: CARDIAC CATHETERIZATION  Result Date: 10/04/2019  Prox LAD to Mid LAD lesion is 80% stenosed.  Dist LAD lesion is 90% stenosed.  A drug-eluting stent was successfully placed using a STENT RESOLUTE ONYX 2.5X38.  Post intervention, there is a 0% residual stenosis.  A drug-eluting stent was successfully placed using a [place] 4.40H47.  Post intervention, there is a 0% residual stenosis.  Conclusion Ad hoc PCI and stent mid and distal LAD lesions 80% long tubular mid lesion 90% distal focal irregular lesion Placement of DES stent to the mid LAD 2.5 x 38 mm resolute Onyx to 13 atm 80% reduced to 0% Placement of DES stent to distal LAD lesion 2.25 x 12 mm resolute Onyx 13 atm 90% reduced to 0% TIMI-3 flow maintained throughout the procedure Minx deployed No complications   CARDIAC CATHETERIZATION  Result Date: 10/03/2019  Prox RCA lesion is 60% stenosed.  1st Mrg lesion is 90% stenosed.  Ost Cx to Prox Cx lesion is 45% stenosed.  Mid LAD lesion is 70% stenosed.  Dist LAD lesion is 80% stenosed.  PCI of the LAD/drug-eluting stenting.        Scheduled Meds: . aspirin EC  81 mg Oral Daily  . atorvastatin  80 mg Oral q1800  . clopidogrel  75 mg Oral Daily  . gabapentin  400 mg Oral QHS  . insulin aspart  0-15 Units Subcutaneous TID WC  . iron polysaccharides  150 mg Oral Daily  . metoprolol tartrate  12.5 mg Oral BID  . nitroGLYCERIN  0.5 inch Topical Q6H  . pantoprazole  40 mg Oral Daily  . PARoxetine  40 mg Oral Daily  . QUEtiapine  100 mg Oral QHS  . sodium chloride flush  3 mL Intravenous Q12H  . sodium chloride flush  3 mL Intravenous Q12H  . sodium chloride flush  3 mL Intravenous Q12H  . topiramate  100 mg Oral Daily  . vitamin B-12  1,000 mcg Oral Daily   Continuous Infusions: . sodium chloride    . sodium chloride       LOS: 2 days   Para Skeans, MD [facility] If 7PM-7AM, please contact night-coverage [email or web address] Password [facility] 10/04/2019, 6:40 PM

## 2019-10-04 NOTE — Care Management Important Message (Signed)
Important Message  Patient Details  Name: Patrick Turner MRN: [id number] Date of Birth: 1946-02-11   Medicare Important Message Given:  Yes     Dannette Barbara 10/04/2019, 11:38 AM

## 2019-10-04 NOTE — Consult Note (Signed)
GI Inpatient Consult Note  Reason for Consult: Anemia   Attending Requesting Consult: Dr. Florina Ou  History of Present Illness: Patrick Turner is a 74 y.o. 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 Hgb was 8.0 admission, but 6.7 on 10/02/09. He underwent catheretization to evaluate unstable angina on 10/03/19, as he had subsequent episodes of 10/10 chest pain. Cath revealed high-grade lesion in the mid LAD as well as distal LAD with normal ejection fraction, for which he underwent successful PCI. Postoperatively, he was transfused 1 unit PRBCs, which improved Hgb to 8.3. This morning, Hgb decreased to 7.6, so GI consultation was requested. Notably, he was hemoccult negative yesterday. Iron studies were consistent with deficiency with low iron at 17, ferritin 9, and % sat 4.  Patrick Turner endorses occasional mild subumbilical pain that has occur intermittent for years without recent change. He takes pantoprazole 40 mg daily for acid reflux, which is well controlled. He had been taking Miralax 1 capful daily for mild constipation for several months, but "fell off recently", resulting in some occasional hard BMs. He recalls one episode of straining in which he saw BRB on the toilet tissue about 3 weeks ago. He denies further BRBPR or melena. Also no dysphagia, heartburn, nausea, vomiting, or alternating diarrhea. No NSAID use, alcohol use, or tobacco use. No known family history of GI malignancy.  He underwent EGD and colonoscopy with Dr. Gustavo Lah for IDA and periumbilical pain on [id number]. EGD demonstrated a small sliding hiatal hernia, chronic inflammation at GEJ, mild chronic gastritis at the pylorus, and a normal duodenum. Colonoscopy revealed sigmoid and descending diverticulosis. He was referred to  vascular surgery for chronic mesenteric ischemia.   Past Medical History:  Past Medical History:  Diagnosis Date  . Altered mental status   . Anemia   . Anxiety   . Aortic atherosclerosis ([place])   . Arthritis   . Coronary artery disease   . CRD (chronic renal disease)   . Depression   . Diabetes mellitus without complication ([place])   . Encephalopathy acute   . GERD (gastroesophageal reflux disease)   . Headache   . Hypercholesteremia   . Hypertension   . Neuropathy   . Severe obesity ([place])   . Sleep apnea   . Sleep terror    per patient this year per patient     Problem List: Patient Active Problem List   Diagnosis Date Noted  . Unstable angina ([place]) 10/02/2019  . Chest pain 10/01/2019  . Chronic mesenteric ischemia ([place]) 05/06/2019  . Abdominal pain 05/03/2019  . Acute on chronic renal failure ([place]) 03/23/2019  . AMS (altered mental status) 12/24/2018  . Dizziness 06/30/2018  . HLD (hyperlipidemia) 02/23/2018  . Aortic atherosclerosis ([place]) 11/02/2017  . Healthcare maintenance 12/26/2016  . DM type 2 with diabetic peripheral neuropathy ([place]) 11/09/2016  . Presence of right artificial knee joint 08/14/2016  . Bilateral carotid artery disease ([place]) 07/12/2016  . 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 Acute diarrhea 05/30/2016  . Encephalopathy acute 05/30/2016  . Anemia 05/30/2016  . Acute on chronic renal insufficiency 05/30/2016  . Diarrhea 05/30/2016  . Depression, major, in remission ([place]) 08/02/2014  . Severe obesity (BMI 35.0-39.9) with comorbidity ([place]) 07/12/2014  . GERD (gastroesophageal reflux disease) 03/08/2014  . Hyperlipidemia associated with  type 2 diabetes mellitus ([place]) 03/08/2014  . 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 Diabetes mellitus with stage 3 chronic kidney disease ([place]) 01/21/2014  . HTN (hypertension), benign 01/21/2014  . OSA (obstructive sleep apnea) 01/21/2014  . Unspecified transient cerebral ischemia 05/29/2012    Past  Surgical History: Past Surgical History:  Procedure Laterality Date  . APPENDECTOMY    . CIRCUMCISION    . COLONOSCOPY    . COLONOSCOPY WITH PROPOFOL N/A 07/10/2018   Procedure: COLONOSCOPY WITH PROPOFOL;  Surgeon: Lollie Sails, MD;  Location: [facility] ENDOSCOPY;  Service: Endoscopy;  Laterality: N/A;  . COLONOSCOPY WITH PROPOFOL N/A 04/23/2019   Procedure: COLONOSCOPY WITH PROPOFOL;  Surgeon: Lollie Sails, MD;  Location: [facility] ENDOSCOPY;  Service: Endoscopy;  Laterality: N/A;  . CORONARY STENT INTERVENTION N/A 10/03/2019   Procedure: CORONARY STENT INTERVENTION;  Surgeon: Yolonda Kida, MD;  Location: [place] CV LAB;  Service: Cardiovascular;  Laterality: N/A;  . ESOPHAGOGASTRODUODENOSCOPY    . ESOPHAGOGASTRODUODENOSCOPY (EGD) WITH PROPOFOL N/A 04/23/2019   Procedure: ESOPHAGOGASTRODUODENOSCOPY (EGD) WITH PROPOFOL;  Surgeon: Lollie Sails, MD;  Location: [facility] ENDOSCOPY;  Service: Endoscopy;  Laterality: N/A;  . JOINT REPLACEMENT    . 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 KNEE ARTHROSCOPY Right   . LEFT HEART CATH AND CORONARY ANGIOGRAPHY N/A 10/03/2019   Procedure: Left Heart Cath and possible Coronary intervention;  Surgeon: Dionisio David, MD;  Location: [place] CV LAB;  Service: Cardiovascular;  Laterality: N/A;  . TONSILLECTOMY    . VISCERAL ANGIOGRAPHY N/A 05/23/2019   Procedure: VISCERAL ANGIOGRAPHY;  Surgeon: Algernon Huxley, MD;  Location: [place] CV LAB;  Service: Cardiovascular;  Laterality: N/A;    Allergies: Allergies  Allergen Reactions  . Penicillins Anaphylaxis    Has patient had a PCN reaction causing immediate rash, facial/tongue/throat swelling, SOB or lightheadedness with hypotension: Yes Has patient had a PCN reaction causing severe rash involving mucus membranes or skin necrosis: No Has  patient had a PCN reaction that required hospitalization Yes Has patient had a PCN reaction occurring within the last 10 years: No If all of the above answers are "NO", then may proceed with Cephalosporin use.  . Tavist [Clemastine] Swelling    Home Medications: Medications Prior to Admission  Medication Sig Dispense Refill Last Dose  . aspirin 81 MG tablet Take 1 tablet (81 mg total) by mouth daily.   09/30/2019 at Unknown time  . atorvastatin (LIPITOR) 80 MG tablet Take 80 mg by mouth at bedtime.   09/30/2019 at 2100  . b complex vitamins capsule Take 1 capsule by mouth daily.   09/30/2019 at 0800  . clonazePAM (KLONOPIN) 1 MG tablet Take 0.5 mg by mouth at bedtime as needed for anxiety.   2 Unknown at PRN  . clopidogrel (PLAVIX) 75 MG tablet Take 1 tablet (75 mg total) by mouth daily. 30 tablet 11 09/30/2019 at Unknown time  . gabapentin (NEURONTIN) 400 MG capsule Take 400 mg by mouth daily.    09/30/2019 at Unknown time  . insulin detemir (LEVEMIR) 100 UNIT/ML injection Inject 15 Units into the skin daily.   09/30/2019 at Unknown time  . iron polysaccharides (NIFEREX) 150 MG capsule Take 150 mg by mouth daily.   09/30/2019 at Unknown time  . lisinopril-hydrochlorothiazide (PRINZIDE,ZESTORETIC) 20-12.5 MG per tablet Take 1 tablet by mouth daily.  09/30/2019 at Unknown time  . metFORMIN (GLUCOPHAGE-XR) 500 MG 24 hr tablet Take 1,000 mg by mouth 2 (two) times daily with a meal.   09/30/2019 at 1700  . Omega-3 Fatty Acids (FISH OIL) 1000 MG CAPS Take 1,000 mg by mouth daily.      Marland Kitchen OZEMPIC, 0.25 OR 0.5 MG/DOSE, 2 MG/1.5ML SOPN Inject 1 Dose into the skin once a week.    Past Week at Unknown time  . pantoprazole (PROTONIX) 40 MG tablet Take 40 mg by mouth daily.   09/30/2019 at Unknown time  . PARoxetine (PAXIL) 40 MG tablet Take 40 mg by mouth daily.    09/30/2019 at 0800  . pioglitazone (ACTOS) 15 MG tablet Take 15 mg by mouth daily.    09/30/2019 at 0800  . QUEtiapine (SEROQUEL) 100 MG tablet Take 100  mg by mouth at bedtime.   09/30/2019 at 2100  . topiramate (TOPAMAX) 100 MG tablet Take 100 mg by mouth daily.    09/30/2019 at 0800  . vitamin B-12 (CYANOCOBALAMIN) 1000 MCG tablet Take 1,000 mcg by mouth daily.      Home medication reconciliation was completed with the patient.   Scheduled Inpatient Medications:   . aspirin EC  81 mg Oral Daily  . atorvastatin  80 mg Oral q1800  . clopidogrel  75 mg Oral Daily  . gabapentin  400 mg Oral QHS  . insulin aspart  0-15 Units Subcutaneous TID WC  . iron polysaccharides  150 mg Oral Daily  . metoprolol tartrate  12.5 mg Oral BID  . nitroGLYCERIN  0.5 inch Topical Q6H  . pantoprazole  40 mg Oral Daily  . PARoxetine  40 mg Oral Daily  . QUEtiapine  100 mg Oral QHS  . sodium chloride flush  3 mL Intravenous Q12H  . sodium chloride flush  3 mL Intravenous Q12H  . sodium chloride flush  3 mL Intravenous Q12H  . topiramate  100 mg Oral Daily  . vitamin B-12  1,000 mcg Oral Daily    Continuous Inpatient Infusions:   . sodium chloride    . sodium chloride      PRN Inpatient Medications:  sodium chloride, sodium chloride, acetaminophen, clonazePAM, hydrALAZINE, labetalol, morphine injection, nitroGLYCERIN, ondansetron (ZOFRAN) IV, sodium chloride flush, sodium chloride flush  Family History: family history includes COPD in his father; Cancer in his father; Diabetes in his mother; Heart disease in his mother.    Social History:   reports that he has never smoked. He has never used smokeless tobacco. He reports current alcohol use. He reports that he does not use drugs.   Review of Systems: Constitutional: Weight is stable.  Eyes: No changes in vision. ENT: No oral lesions, sore throat.  GI: see HPI.  Heme/Lymph: No easy bruising.  CV: No chest pain.  GU: No hematuria.  Integumentary: No rashes.  Neuro: No headaches.  Psych: No depression/anxiety.  Endocrine: No heat/cold intolerance.  Allergic/Immunologic: No urticaria.  Resp: No  cough, SOB.  Musculoskeletal: No joint swelling.    Physical Examination: BP (!) 150/55 (BP Location: Left Arm)   Pulse 65   Temp 98.6 F (37 C)   Resp 18   Ht 5\' 5"  (1.651 m)   Wt 85.6 kg   SpO2 96%   BMI 31.40 kg/m  Gen: NAD, alert and oriented x 4 HEENT: PEERLA, EOMI, Neck: supple, no JVD or thyromegaly Chest: CTA bilaterally, no wheezes, crackles, or other adventitious sounds CV: RRR, no m/g/c/r Abd: soft, NT,  ND, +BS in all four quadrants; no HSM, guarding, ridigity, or rebound tenderness Ext: no edema, well perfused with 2+ pulses, Skin: no rash or lesions noted Lymph: no LAD  Data: Lab Results  Component Value Date   WBC 7.6 10/04/2019   HGB 7.6 (L) 10/04/2019   HCT 24.5 (L) 10/04/2019   MCV 80.9 10/04/2019   PLT 224 10/04/2019   Recent Labs  Lab 10/03/19 1125 10/03/19 1914 10/04/19 0636  HGB 6.9* 8.3* 7.6*   Lab Results  Component Value Date   NA 138 10/04/2019   K 4.4 10/04/2019   CL 109 10/04/2019   CO2 22 10/04/2019   BUN 31 (H) 10/04/2019   CREATININE 1.63 (H) 10/04/2019   Lab Results  Component Value Date   ALT 14 03/23/2019   AST 17 03/23/2019   ALKPHOS 54 03/23/2019   BILITOT 0.5 03/23/2019   Recent Labs  Lab 10/02/19 1316 10/02/19 1316 10/03/19 1125  APTT 34  --   --   INR 1.1   < > 1.6*   < > = values in this interval not displayed.   Assessment/Plan: Patrick Turner is a 74 y.o. 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 GI consultation was requested for evaluation of acute-on-chronic anemia during admission for unstable angina, with Cath revealed high-grade lesion in the mid LAD as well as distal LAD requiring stent placement.  1. Iron deficiency anemia - Acute on chronic. Currently without new GI symptoms or signs of occult or over bleeding. Hemoccult negative yesterday. Endoscopic  evaluation for the same on 04/23/2019 was nondiagnostic. Suspect this is likely multifactorial, with components of anemia of chronic disease including stage III CKD, hemodilution during admission, etc.   Recommendations: - Case reviewed with Dr. Alice Reichert. EGD and colonoscopy in 04/2019 are quite reassuring. Given this, his recent stent placement, and multiple chronic comorbidities, he is a suboptimal candidate for endoluminal evaluation. In the absence of new GI symptoms or signs of overt bleeding, endoscopic procedures are not recommended.  - Recommend outpatient follow-up with GI to discuss possible capsule endoscopy. - Recommend outpatient hematology evaluation and assistant with chronic anemia management. - GI to sign off at this time. Please consult again if further assistant ice needed.  Thank you for the consult. Please call with questions or concerns.  Lavera Guise, PA-C [facility] Phone: [phone] Pager: [phone]

## 2019-10-04 NOTE — TOC Initial Note (Addendum)
Transition of Care [facility]) - Initial/Assessment Note    Patient Details  Name: Patrick Turner MRN: [id number] Date of Birth: February 19, 1946  Transition of Care [facility]) CM/SW Contact:    Victorino Dike, RN Phone Number: 10/04/2019, 9:39 AM  Clinical Narrative:                   Met with patient today for assessment.  Pleasant gentleman the PT has recommended outpatient rehab.  TOC contacted insurance company and verified copay is $10 per visit.  Patient is agreeable to this  He lives at home with his wife and they are able to shop for groceries, pharmacy and run errands without assistance.  No further TOC needs at this time, please re-consult for new needs.   Expected Discharge Plan: Home/Self Care Barriers to Discharge: Continued Medical Work up   Patient Goals and CMS Choice        Expected Discharge Plan and Services Expected Discharge Plan: Home/Self Care   Discharge Planning Services: NA   Living arrangements for the past 2 months: Single Family Home                 DME Arranged: N/A DME Agency: NA       HH Arranged: NA HH Agency: NA        Prior Living Arrangements/Services Living arrangements for the past 2 months: [place] Lives with:: Spouse Patient language and need for interpreter reviewed:: Yes Do you feel safe going back to the place where you live?: Yes      Need for Family Participation in Patient Care: No (Comment) Care giver support system in place?: Yes (comment)   Criminal Activity/Legal Involvement Pertinent to Current Situation/Hospitalization: No - Comment as needed  Activities of Daily Living Home Assistive Devices/Equipment: Bedside commode/3-in-1, Blood pressure cuff, CBG Meter, Walker (specify type), Cane (specify quad or straight), Shower chair without back, CPAP, Eyeglasses, Hearing aid ADL Screening (condition at time of admission) Patient's cognitive ability adequate to safely complete daily activities?: Yes Is the patient  deaf or have difficulty hearing?: No Does the patient have difficulty seeing, even when wearing glasses/contacts?: No Does the patient have difficulty concentrating, remembering, or making decisions?: No Patient able to express need for assistance with ADLs?: Yes Does the patient have difficulty dressing or bathing?: No Independently performs ADLs?: Yes (appropriate for developmental age) Does the patient have difficulty walking or climbing stairs?: No Weakness of Legs: None Weakness of Arms/Hands: None  Permission Sought/Granted                  Emotional Assessment Appearance:: Appears stated age Attitude/Demeanor/Rapport: Engaged Affect (typically observed): Appropriate, Accepting Orientation: : Oriented to Self, Oriented to Place, Oriented to  Time, Oriented to Situation   Psych Involvement: No (comment)  Admission diagnosis:  HTN (hypertension), benign [I10] DM type 2 with diabetic peripheral neuropathy ([place]) [E11.42] Chest pain [R07.9] Nonspecific chest pain [R07.9] Unstable angina ([place]) [I20.0] Patient Active Problem List   Diagnosis Date Noted  . Unstable angina ([place]) 10/02/2019  . Chest pain 10/01/2019  . Chronic mesenteric ischemia ([place]) 05/06/2019  . Abdominal pain 05/03/2019  . Acute on chronic renal failure ([place]) 03/23/2019  . AMS (altered mental status) 12/24/2018  . Dizziness 06/30/2018  . HLD (hyperlipidemia) 02/23/2018  . Aortic atherosclerosis ([place]) 11/02/2017  . Healthcare maintenance 12/26/2016  . DM type 2 with diabetic peripheral neuropathy ([place]) 11/09/2016  . Presence of right artificial knee joint 08/14/2016  . Bilateral carotid  artery disease ([place]) 07/12/2016  . 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 Acute diarrhea 05/30/2016  . Encephalopathy acute 05/30/2016  . Anemia 05/30/2016  . Acute on chronic renal insufficiency 05/30/2016  . Diarrhea 05/30/2016  . Depression, major, in remission ([place]) 08/02/2014  . Severe obesity (BMI  35.0-39.9) with comorbidity ([place]) 07/12/2014  . GERD (gastroesophageal reflux disease) 03/08/2014  . Hyperlipidemia associated with type 2 diabetes mellitus ([place]) 03/08/2014  . Drug-induced Parkinsonism ([place]) 01/24/2014  . Diabetes mellitus with stage 3 chronic kidney disease ([place]) 01/21/2014  . HTN (hypertension), benign 01/21/2014  . OSA (obstructive sleep apnea) 01/21/2014  . Unspecified transient cerebral ischemia 05/29/2012   PCP:  Kirk Ruths, MD Pharmacy:   [id number] [place], [id number]: [phone]: [phone]    Social Determinants of Health (SDOH) Interventions    Readmission Risk Interventions Readmission Risk Prevention Plan 12/25/2018 12/24/2018  Transportation Screening Complete Complete  Medication Review (RN Care Manager) Complete Complete  Some recent data might be hidden

## 2019-10-05 LAB — HEMOGLOBIN AND HEMATOCRIT, BLOOD
HCT: 24.9 % — ABNORMAL LOW (ref 39.0–52.0)
HCT: 30.2 % — ABNORMAL LOW (ref 39.0–52.0)
Hemoglobin: 7.9 g/dL — ABNORMAL LOW (ref 13.0–17.0)
Hemoglobin: 9.4 g/dL — ABNORMAL LOW (ref 13.0–17.0)

## 2019-10-05 LAB — PREPARE RBC (CROSSMATCH)

## 2019-10-05 LAB — GLUCOSE, CAPILLARY
Glucose-Capillary: 151 mg/dL — ABNORMAL HIGH (ref 70–99)
Glucose-Capillary: 127 mg/dL — ABNORMAL HIGH (ref 70–99)

## 2019-10-05 MED ORDER — SODIUM CHLORIDE 0.9% IV SOLUTION: Freq: Once | INTRAVENOUS | Status: AC

## 2019-10-05 NOTE — Progress Notes (Signed)
GI Inpatient Follow-up Note  Patient Identification: Patrick Turner is a 74 y.o. 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 During admission, Hgb decreased from 8 > 6.7, improving to 8.3 with 1 unit PRBCs but decreasing back to 7.6 thereafter. GI was consulted for acute-on-chronic anemia prior to discharge.  Patrick Turner reports experiencing L-sided chest pain last night that rated a 2/10 severity. He notified the nurse after a few minutes of pain, and experienced relief after taking NTG. Otherwise, he denies new GI concerns or signs of GI bleeding overnight.   Subjective:  Scheduled Inpatient Medications:  . sodium chloride   Intravenous Once  . aspirin EC  81 mg Oral Daily  . atorvastatin  80 mg Oral q1800  . clopidogrel  75 mg Oral Daily  . gabapentin  400 mg Oral QHS  . insulin aspart  0-15 Units Subcutaneous TID WC  . iron polysaccharides  150 mg Oral Daily  . metoprolol tartrate  12.5 mg Oral BID  . nitroGLYCERIN  0.5 inch Topical Q6H  . pantoprazole  40 mg Oral Daily  . PARoxetine  40 mg Oral Daily  . QUEtiapine  100 mg Oral QHS  . sodium chloride flush  3 mL Intravenous Q12H  . sodium chloride flush  3 mL Intravenous Q12H  . sodium chloride flush  3 mL Intravenous Q12H  . topiramate  100 mg Oral Daily  . vitamin B-12  1,000 mcg Oral Daily    Continuous Inpatient Infusions:   . sodium chloride    . sodium chloride      PRN Inpatient Medications:  sodium chloride, sodium chloride, acetaminophen, clonazePAM, hydrALAZINE, labetalol, morphine injection, nitroGLYCERIN, ondansetron (ZOFRAN) IV, sodium chloride flush, sodium chloride flush  Review of Systems: Constitutional: Weight is stable.  Eyes: No changes in vision. ENT: No oral lesions, sore throat.  GI: see HPI.   Heme/Lymph: No easy bruising.  CV: No chest pain.  GU: No hematuria.  Integumentary: No rashes.  Neuro: No headaches.  Psych: No depression/anxiety.  Endocrine: No heat/cold intolerance.  Allergic/Immunologic: No urticaria.  Resp: No cough, SOB.  Musculoskeletal: No joint swelling.    Physical Examination: BP (!) 148/59 (BP Location: Right Arm)   Pulse 66   Temp 97.9 F (36.6 C) (Oral)   Resp 18   Ht 5\' 5"  (1.651 m)   Wt 85.6 kg   SpO2 96%   BMI 31.40 kg/m  Gen: NAD, alert and oriented x 4 HEENT: PEERLA, EOMI, Neck: supple, no JVD or thyromegaly Chest: CTA bilaterally, no wheezes, crackles, or other adventitious sounds CV: RRR, no m/g/c/r Abd: soft, NT, ND, +BS in all four quadrants; no HSM, guarding, ridigity, or rebound tenderness Ext: no edema, well perfused with 2+ pulses, Skin: no rash or lesions noted Lymph: no LAD  Data: Lab Results  Component Value Date   WBC 7.6 10/04/2019   HGB 7.6 (L) 10/04/2019   HCT 24.5 (L) 10/04/2019   MCV 80.9 10/04/2019   PLT 224 10/04/2019   Recent Labs  Lab 10/03/19 1125 10/03/19 1914 10/04/19 0636  HGB 6.9* 8.3* 7.6*   Lab Results  Component Value Date   NA 138 10/04/2019   K 4.4 10/04/2019   CL 109 10/04/2019   CO2 22 10/04/2019   BUN 31 (H) 10/04/2019   CREATININE 1.63 (H) 10/04/2019  Lab Results  Component Value Date   ALT 14 03/23/2019   AST 17 03/23/2019   ALKPHOS 54 03/23/2019   BILITOT 0.5 03/23/2019   Recent Labs  Lab 10/02/19 1316 10/02/19 1316 10/03/19 1125  APTT 34  --   --   INR 1.1   < > 1.6*   < > = values in this interval not displayed.   Assessment/Plan: Patrick Turner is a 74 y.o. male Patrick Turner is a 74 y.o. 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 GI consultation was requested for evaluation of acute-on-chronic anemia during admission for  unstable angina, with Cath revealed high-grade lesion in the mid LAD as well as distal LAD requiring stent placement.  1. Iron deficiency anemia - Acute on chronic, now stabilized at Hgb 7.9. Currently without new GI symptoms or signs of occult or over bleeding. Hemoccult negative yesterday. Endoscopic evaluation for the same on 04/23/2019 was nondiagnostic. Suspect this is likely multifactorial, with components of anemia of chronic disease including stage III CKD, hemodilution during admission, perioperative blood loss, etc.   Recommendations: - Case reviewed with Dr. Alice Reichert. EGD and colonoscopy in 04/2019 are quite reassuring. Given this, his recent stent placement, and multiple chronic comorbidities, he is a suboptimal candidate for endoluminal evaluation. In the absence of new GI symptoms or signs of overt bleeding, endoscopic procedures are not recommended.  - Recommend outpatient follow-up with GI to discuss possible capsule endoscopy. - Recommend outpatient hematology evaluation and assistant with chronic anemia management. - GI to sign off at this time. Please consult again if further assistance needed.  Patient has been discussed with Dr. Alice Reichert, pending further GI recommendations at this time. Please call with questions or concerns.  Lavera Guise, PA-C [facility] Phone: [phone] Pager: [phone]

## 2019-10-05 NOTE — Discharge Summary (Signed)
Physician Discharge Summary  MANU RUBEY ZOX:[id number] DOB: March 19, 1946 DOA: 10/01/2019  PCP: Kirk Ruths, MD  Admit date: 10/01/2019 Discharge date: 10/05/2019  Admitted From: Home Disposition: Home  Recommendations for Outpatient Follow-up:  1. Follow up with PCP in 1-2 weeks 2. 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  Patient was working at [employer]	[place] and helping customers over the counter when around 6:00PMhe noted sudden onset focal pressure left-sided chest pain. Also had associated dizziness. He felt like he was going to "fall over." He then sat down but did not have any improvement in his symptoms. Denies any associated shortness of breath. No diaphoresis. No nausea or vomiting. Denies any previous history of chest pain or significant cardiac history. 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 Has had some coughing productive of sputum and runny nose but no fevers. He recently had a fall 2 days ago going up the steps since he went"toofast"and did not have guardrails tohold onto. He hit his head and left shoulder but denies any loss of consciousness. States his pain is different from his shoulder pain.He denies any tobacco, alcohol illicit drug use. Family history significant for mother who has heart disease. Chest pain now resolved in the ED after getting '4mg'$  of morphine, aspirin and SQ nitro  Pt was continuing to have chest pain and cardiology consulted  And started on heparin drip per acs protocol and pt had left heart cath with 2 stents placed in LAD. Pt today is doing well and feeling much better. Ask when he can go home.   Post catheterization patient is doing well however he has had issues with anemia.  Patient is baseline anemic however received 3 units total packed red blood cells with no clear evidence of bleeding noted.  GI was consulted prior to discharge and did not feel this patient met criteria for endoscopic evaluation.  Recommended discharge home with follow-up with gastroenterology post discharge.  I had a lengthy conversation with the patient regarding his anemia.  I explained that there is no clear evidence of blood loss however his hemoglobin should be around 8 or higher.  I recommended that he see his primary care physician within 1 week of discharge and inquire about referral to gastroenterology or potentially hematology.  Expressed understanding.  All questions were answered.    Discharge Diagnoses:  Principal Problem:   Chest pain Active Problems:   Anemia   Acute on chronic renal insufficiency   HTN (hypertension), benign   HLD (hyperlipidemia)   Aortic atherosclerosis (HCC)   Depression, major, in remission (HCC)   OSA (obstructive sleep apnea)   Chronic mesenteric ischemia (HCC)   Unstable angina (HCC)  Coronary artery disease Status post 2 stents in LAD Continue aspirin and statin Continue Plavix Follow-up outpatient cardiology Continue metoprolol  Symptomatic anemia Patient was transfused total 3 units packed red cells No clear source of blood loss Hemoccult negative GI consulted No clear indication for endoscopy at this time Follow-up outpatient with GI for consideration for capsule endoscopy  Discharge Instructions  Discharge Instructions    (HEART  FAILURE PATIENTS) Call MD:  Anytime you have any of the following symptoms: 1) 3 pound weight gain in 24 hours or 5 pounds in 1 week 2) shortness of  breath, with or without a dry hacking cough 3) swelling in the hands, feet or stomach 4) if you have to sleep on extra pillows at night in order to breathe.   Complete by: As directed    (HEART FAILURE PATIENTS) Call MD:  Anytime you have any of the following symptoms: 1) 3 pound weight gain in 24 hours or 5 pounds in 1 week 2) shortness of breath, with or without a dry hacking cough 3) swelling in the hands, feet or stomach 4) if you have to sleep on extra pillows at night in order to breathe.   Complete by: As directed    AMB Referral to Cardiac Rehabilitation - Phase II   Complete by: As directed    Diagnosis: Coronary Stents   After initial evaluation and assessments completed: Virtual Based Care may be provided alone or in conjunction with Phase 2 Cardiac Rehab based on patient barriers.: Yes   Amb Referral to Cardiac Rehabilitation   Complete by: As directed    Diagnosis: Coronary Stents   After initial evaluation and assessments completed: Virtual Based Care may be provided alone or in conjunction with Phase 2 Cardiac Rehab based on patient barriers.: Yes   Call MD for:  difficulty breathing, headache or visual disturbances   Complete by: As directed    Call MD for:  difficulty breathing, headache or visual disturbances   Complete by: As directed    Call MD for:  extreme fatigue   Complete by: As directed    Call MD for:  hives   Complete by: As directed    Call MD for:  hives   Complete by: As directed    Call MD for:  persistant dizziness or light-headedness   Complete by: As directed    Call MD for:  persistant dizziness or light-headedness   Complete by: As directed    Call MD for:  persistant nausea and vomiting   Complete by: As directed    Call MD for:  persistant nausea and vomiting   Complete by: As directed    Call MD for:  redness, tenderness, or signs of infection (pain, swelling, redness, odor or green/yellow discharge around incision site)   Complete by: As  directed    Call MD for:  redness, tenderness, or signs of infection (pain, swelling, redness, odor or green/yellow discharge around incision site)   Complete by: As directed    Call MD for:  severe uncontrolled pain   Complete by: As directed    Call MD for:  severe uncontrolled pain   Complete by: As directed    Call MD for:  temperature >100.4   Complete by: As directed    Call MD for:  temperature >100.4   Complete by: As directed    Diet - low sodium heart healthy   Complete by: As directed    Diet - low sodium heart healthy   Complete by: As directed    Diet - low sodium heart healthy   Complete by: As directed    Discharge instructions   Complete by: As directed    F/U with pcp for hospital follow up.   Discharge instructions   Complete by: As directed    See your PCP within 1 week.  Recommend repeating labs at that time.  Possibly  ask about referral to gastroenterology or hematology to evaluate your anemia   Increase activity slowly   Complete by: As directed    Increase activity slowly   Complete by: As directed    Increase activity slowly   Complete by: As directed    Lifting restrictions   Complete by: As directed    Do not lift more than 10-15 pounds or only as tolerated.     Allergies as of 10/05/2019      Reactions   Penicillins Anaphylaxis   Has patient had a PCN reaction causing immediate rash, facial/tongue/throat swelling, SOB or lightheadedness with hypotension: Yes Has patient had a PCN reaction causing severe rash involving mucus membranes or skin necrosis: No Has patient had a PCN reaction that required hospitalization Yes Has patient had a PCN reaction occurring within the last 10 years: No If all of the above answers are "NO", then may proceed with Cephalosporin use.   Tavist [clemastine] Swelling      Medication List    TAKE these medications   aspirin 81 MG tablet Take 1 tablet (81 mg total) by mouth daily.   atorvastatin 80 MG  tablet Commonly known as: LIPITOR Take 80 mg by mouth at bedtime.   b complex vitamins capsule Take 1 capsule by mouth daily.   clonazePAM 1 MG tablet Commonly known as: KLONOPIN Take 0.5 mg by mouth at bedtime as needed for anxiety.   clopidogrel 75 MG tablet Commonly known as: Plavix Take 1 tablet (75 mg total) by mouth daily.   Fish Oil 1000 MG Caps Take 1,000 mg by mouth daily.   gabapentin 400 MG capsule Commonly known as: NEURONTIN Take 400 mg by mouth daily.   iron polysaccharides 150 MG capsule Commonly known as: NIFEREX Take 150 mg by mouth daily.   Levemir 100 UNIT/ML injection Generic drug: insulin detemir Inject 15 Units into the skin daily.   lisinopril-hydrochlorothiazide 20-12.5 MG tablet Commonly known as: ZESTORETIC Take 1 tablet by mouth daily.   metFORMIN 500 MG 24 hr tablet Commonly known as: GLUCOPHAGE-XR Take 1,000 mg by mouth 2 (two) times daily with a meal.   metoprolol tartrate 25 MG tablet Commonly known as: LOPRESSOR Take 0.5 tablets (12.5 mg total) by mouth 2 (two) times daily.   Ozempic (0.25 or 0.5 MG/DOSE) 2 MG/1.5ML Sopn Generic drug: Semaglutide(0.25 or 0.'5MG'$ /DOS) Inject 1 Dose into the skin once a week.   pantoprazole 40 MG tablet Commonly known as: PROTONIX Take 40 mg by mouth daily.   PARoxetine 40 MG tablet Commonly known as: PAXIL Take 40 mg by mouth daily.   pioglitazone 15 MG tablet Commonly known as: ACTOS Take 15 mg by mouth daily.   QUEtiapine 100 MG tablet Commonly known as: SEROQUEL Take 100 mg by mouth at bedtime.   topiramate 100 MG tablet Commonly known as: TOPAMAX Take 100 mg by mouth daily.   vitamin B-12 1000 MCG tablet Commonly known as: CYANOCOBALAMIN Take 1,000 mcg by mouth daily.      Follow-up Information    [facility]iac [facility]hab Follow up.   Specialty: Cardiac Rehabilitation Why: Your Cardiologist has referred you to outpatient Cardiac Rehab.  The Cardiac Rehab department  will contact you within one to two weeks of discharge to schedule your first appointment.   Contact information: [place] [phone] [address] [place]       Kirk Ruths, MD. Go on 10/08/2019.   Specialty: Internal Medicine Why: appointment at  10am Contact information: [place] [address] [phone]          Allergies  Allergen Reactions  . Penicillins Anaphylaxis    Has patient had a PCN reaction causing immediate rash, facial/tongue/throat swelling, SOB or lightheadedness with hypotension: Yes Has patient had a PCN reaction causing severe rash involving mucus membranes or skin necrosis: No Has patient had a PCN reaction that required hospitalization Yes Has patient had a PCN reaction occurring within the last 10 years: No If all of the above answers are "NO", then may proceed with Cephalosporin use.  Simona Huh [Clemastine] Swelling    Consultations:  Cardiology- [place]   Procedures/Studies: DG Chest 2 View  Result Date: 10/01/2019 CLINICAL DATA:  Chest pain EXAM: CHEST - 2 VIEW COMPARISON:  12/24/2018 FINDINGS: Mildly low lung volumes. No focal opacity or pleural effusion. Stable cardiomediastinal silhouette. No pneumothorax. IMPRESSION: No active cardiopulmonary disease.  Low lung volumes. Electronically Signed   By: Donavan Foil M.D.   On: 10/01/2019 20:13   CT Head Wo Contrast  Result Date: 10/01/2019 CLINICAL DATA:  [place] and hit head EXAM: CT HEAD WITHOUT CONTRAST CT CERVICAL SPINE WITHOUT CONTRAST TECHNIQUE: Multidetector CT imaging of the head and cervical spine was performed following the standard protocol without intravenous contrast. Multiplanar CT image reconstructions of the cervical spine were also generated. COMPARISON:  CT 12/24/2018, 11/05/2015 FINDINGS: CT HEAD FINDINGS Brain: No acute territorial infarction, hemorrhage, or intracranial mass.  Mild atrophy. Normal ventricle size. Vascular: No hyperdense vessels.  Carotid vascular calcification Skull: Normal. Negative for fracture or focal lesion. Sinuses/Orbits: Mild mucosal thickening in the ethmoid sinuses Other: None CT CERVICAL SPINE FINDINGS Alignment: Straightening of the cervical spine. No subluxation. Facet alignment is normal Skull base and vertebrae: No acute fracture. No primary bone lesion or focal pathologic process. Soft tissues and spinal canal: No prevertebral fluid or swelling. No visible canal hematoma. Disc levels: Moderate severe degenerative changes at C5-C6, C6-C7 and C7 T1. Bilateral foraminal narrowing at these levels. Upper chest: Negative. Other: None IMPRESSION: 1. No CT evidence for acute intracranial abnormality.  Mild atrophy 2. Straightening of the cervical spine with degenerative changes. No acute osseous abnormality. Electronically Signed   By: Donavan Foil M.D.   On: 10/01/2019 20:10   CT Cervical Spine Wo Contrast  Result Date: 10/01/2019 CLINICAL DATA:  [place] and hit head EXAM: CT HEAD WITHOUT CONTRAST CT CERVICAL SPINE WITHOUT CONTRAST TECHNIQUE: Multidetector CT imaging of the head and cervical spine was performed following the standard protocol without intravenous contrast. Multiplanar CT image reconstructions of the cervical spine were also generated. COMPARISON:  CT 12/24/2018, 11/05/2015 FINDINGS: CT HEAD FINDINGS Brain: No acute territorial infarction, hemorrhage, or intracranial mass. Mild atrophy. Normal ventricle size. Vascular: No hyperdense vessels.  Carotid vascular calcification Skull: Normal. Negative for fracture or focal lesion. Sinuses/Orbits: Mild mucosal thickening in the ethmoid sinuses Other: None CT CERVICAL SPINE FINDINGS Alignment: Straightening of the cervical spine. No subluxation. Facet alignment is normal Skull base and vertebrae: No acute fracture. No primary bone lesion or focal pathologic process. Soft tissues and spinal canal: No  prevertebral fluid or swelling. No visible canal hematoma. Disc levels: Moderate severe degenerative changes at C5-C6, C6-C7 and C7 T1. Bilateral foraminal narrowing at these levels. Upper chest: Negative. Other: None IMPRESSION: 1. No CT evidence for acute intracranial abnormality.  Mild atrophy 2. Straightening of the cervical spine with degenerative changes. No acute osseous abnormality. Electronically  Signed   By: Donavan Foil M.D.   On: 10/01/2019 20:10   CARDIAC CATHETERIZATION  Result Date: 10/04/2019  Prox LAD to Mid LAD lesion is 80% stenosed.  Dist LAD lesion is 90% stenosed.  A drug-eluting stent was successfully placed using a STENT RESOLUTE ONYX [id number].  Post intervention, there is a 0% residual stenosis.  A drug-eluting stent was successfully placed using a [place] [id number].  Post intervention, there is a 0% residual stenosis.  Conclusion Ad hoc PCI and stent mid and distal LAD lesions 80% long tubular mid lesion 90% distal focal irregular lesion Placement of DES stent to the mid LAD 2.5 x 38 mm resolute Onyx to 13 atm 80% reduced to 0% Placement of DES stent to distal LAD lesion 2.25 x 12 mm resolute Onyx 13 atm 90% reduced to 0% TIMI-3 flow maintained throughout the procedure Minx deployed No complications   CARDIAC CATHETERIZATION  Result Date: 10/03/2019  Prox RCA lesion is 60% stenosed.  1st Mrg lesion is 90% stenosed.  Ost Cx to Prox Cx lesion is 45% stenosed.  Mid LAD lesion is 70% stenosed.  Dist LAD lesion is 80% stenosed.  PCI of the LAD/drug-eluting stenting.   DG Shoulder Left  Result Date: 10/01/2019 CLINICAL DATA:  Chest pain shoulder pain EXAM: LEFT SHOULDER - 2+ VIEW COMPARISON:  None. FINDINGS: No fracture or malalignment. Mild calcific tendinitis. Mild AC joint degenerative change. IMPRESSION: No acute osseous abnormality Electronically Signed   By: Donavan Foil M.D.   On: 10/01/2019 20:15    (Echo, Carotid, EGD, Colonoscopy, ERCP)     Subjective: Seen and examined on day of discharge. No complaints, as to go home Transfused 1 unit packed red cells on the day of discharge  Discharge Exam: Vitals:   10/05/19 1251 10/05/19 1510  BP: (!) 129/59 (!) 147/59  Pulse: 63 71  Resp: 16 18  Temp: 98.1 F (36.7 C) 98.3 F (36.8 C)  SpO2: 99% 100%   Vitals:   10/05/19 0812 10/05/19 1226 10/05/19 1251 10/05/19 1510  BP: (!) 148/59 (!) 129/55 (!) 129/59 (!) 147/59  Pulse: 66 62 63 71  Resp: '18 16 16 18  '$ Temp: 97.9 F (36.6 C) 97.8 F (36.6 C) 98.1 F (36.7 C) 98.3 F (36.8 C)  TempSrc: Oral Oral Oral Oral  SpO2: 96% 98% 99% 100%  Weight:      Height:        General: Pt is alert, awake, not in acute distress Cardiovascular: RRR, S1/S2 +, no rubs, no gallops Respiratory: CTA bilaterally, no wheezing, no rhonchi Abdominal: Soft, NT, ND, bowel sounds + Extremities: no edema, no cyanosis    The results of significant diagnostics from this hospitalization (including imaging, microbiology, ancillary and laboratory) are listed below for reference.     Microbiology: Recent Results (from the past 240 hour(s))  SARS CORONAVIRUS 2 (TAT 6-24 HRS) Nasopharyngeal Nasopharyngeal Swab     Status: None   Collection Time: 10/01/19 10:57 PM   Specimen: Nasopharyngeal Swab  Result Value Ref Range Status   SARS Coronavirus 2 NEGATIVE NEGATIVE Final    Comment: (NOTE) SARS-CoV-2 target nucleic acids are NOT DETECTED. The SARS-CoV-2 RNA is generally detectable in upper and lower respiratory specimens during the acute phase of infection. Negative results do not preclude SARS-CoV-2 infection, do not rule out co-infections with other pathogens, and should not be used as the sole basis for treatment or other patient management decisions. Negative results must be combined with clinical observations,  patient history, and epidemiological information. The expected result is Negative. Fact Sheet for  Patients: SugarRoll.be Fact Sheet for Healthcare Providers: https://www.woods-mathews.com/ This test is not yet approved or cleared by the [place] FDA and  has been authorized for detection and/or diagnosis of SARS-CoV-2 by FDA under an Emergency Use Authorization (EUA). This EUA will remain  in effect (meaning this test can be used) for the duration of the COVID-19 declaration under Section 56 4(b)(1) of the Act, 21 U.S.C. section 360bbb-3(b)(1), unless the authorization is terminated or revoked sooner. Performed at [facility], [place] [address], [place], [place] [address]      Labs: BNP (last 3 results) Recent Labs    12/24/18 0105  BNP 12.8   Basic Metabolic Panel: Recent Labs  Lab 10/01/19 1918 10/03/19 0149 10/03/19 1125 10/04/19 0636  NA 139 137 136 138  K 4.8 4.3 4.6 4.4  CL 106 108 107 109  CO2 24 21* 23 22  GLUCOSE 108* 164* 257* 180*  BUN 35* 31* 27* 31*  CREATININE 1.62* 1.45* 1.39* 1.63*  CALCIUM 8.7* 8.5* 8.2* 8.4*  MG  --  2.0  --   --    Liver Function Tests: No results for input(s): AST, ALT, ALKPHOS, BILITOT, PROT, ALBUMIN in the last 168 hours. No results for input(s): LIPASE, AMYLASE in the last 168 hours. No results for input(s): AMMONIA in the last 168 hours. CBC: Recent Labs  Lab 10/01/19 1918 10/01/19 1918 10/03/19 0149 10/03/19 0149 10/03/19 1125 10/03/19 1914 10/04/19 0636 10/05/19 0920 10/05/19 1524  WBC 8.9  --  6.6  --  5.6  --  7.6  --   --   NEUTROABS 6.5  --   --   --   --   --   --   --   --   HGB 8.0*   < > 6.7*   < > 6.9* 8.3* 7.6* 7.9* 9.4*  HCT 26.4*   < > 22.0*   < > 23.2* 26.9* 24.5* 24.9* 30.2*  MCV 81.2  --  79.4*  --  81.1  --  80.9  --   --   PLT 254  --  221  --  240  --  224  --   --    < > = values in this interval not displayed.   Cardiac Enzymes: No results for input(s): CKTOTAL, CKMB, CKMBINDEX, TROPONINI in the last 168 hours. BNP: Invalid input(s):  POCBNP CBG: Recent Labs  Lab 10/04/19 1133 10/04/19 1658 10/04/19 2111 10/05/19 0808 10/05/19 1127  GLUCAP 228* 112* 227* 151* 127*   D-Dimer No results for input(s): DDIMER in the last 72 hours. Hgb A1c No results for input(s): HGBA1C in the last 72 hours. Lipid Profile No results for input(s): CHOL, HDL, LDLCALC, TRIG, CHOLHDL, LDLDIRECT in the last 72 hours. Thyroid function studies No results for input(s): TSH, T4TOTAL, T3FREE, THYROIDAB in the last 72 hours.  Invalid input(s): FREET3 Anemia work up Recent Labs    10/03/19 1125  VITAMINB12 432  FOLATE 12.8  FERRITIN 9*  TIBC 391  IRON 17*  RETICCTPCT 1.2   Urinalysis    Component Value Date/Time   COLORURINE STRAW (A) 03/23/2019 1726   APPEARANCEUR CLEAR (A) 03/23/2019 1726   APPEARANCEUR Clear 11/23/2012 0629   LABSPEC 1.006 03/23/2019 1726   LABSPEC 1.018 11/23/2012 0629   PHURINE 5.0 03/23/2019 1726   GLUCOSEU NEGATIVE 03/23/2019 1726   GLUCOSEU Negative 11/23/2012 0629   HGBUR NEGATIVE 03/23/2019  [place] 03/23/2019 1726   BILIRUBINUR Negative 11/23/2012 [place] 03/23/2019 1726   PROTEINUR NEGATIVE 03/23/2019 1726   NITRITE NEGATIVE 03/23/2019 1726   LEUKOCYTESUR NEGATIVE 03/23/2019 1726   LEUKOCYTESUR Negative 11/23/2012 0629   Sepsis Labs Invalid input(s): PROCALCITONIN,  WBC,  LACTICIDVEN Microbiology Recent Results (from the past 240 hour(s))  SARS CORONAVIRUS 2 (TAT 6-24 HRS) Nasopharyngeal Nasopharyngeal Swab     Status: None   Collection Time: 10/01/19 10:57 PM   Specimen: Nasopharyngeal Swab  Result Value Ref Range Status   SARS Coronavirus 2 NEGATIVE NEGATIVE Final    Comment: (NOTE) SARS-CoV-2 target nucleic acids are NOT DETECTED. The SARS-CoV-2 RNA is generally detectable in upper and lower respiratory specimens during the acute phase of infection. Negative results do not preclude SARS-CoV-2 infection, do not rule out co-infections with other  pathogens, and should not be used as the sole basis for treatment or other patient management decisions. Negative results must be combined with clinical observations, patient history, and epidemiological information. The expected result is Negative. Fact Sheet for Patients: SugarRoll.be Fact Sheet for Healthcare Providers: https://www.woods-mathews.com/ This test is not yet approved or cleared by the [place] FDA and  has been authorized for detection and/or diagnosis of SARS-CoV-2 by FDA under an Emergency Use Authorization (EUA). This EUA will remain  in effect (meaning this test can be used) for the duration of the COVID-19 declaration under Section 56 4(b)(1) of the Act, 21 U.S.C. section 360bbb-3(b)(1), unless the authorization is terminated or revoked sooner. Performed at [facility], [place] [address], [place], [place] [address]      Time coordinating discharge: Over 30 minutes  SIGNED:   Sidney Ace, MD  [facility] 10/05/2019, 4:29 PM Pager   If 7PM-7AM, please contact night-coverage [email or web address]

## 2019-10-05 NOTE — Progress Notes (Signed)
Educated patient on discharge instructions and provided work note. Patient verbalized understanding and was picked up by wife to be driven home.

## 2019-10-05 NOTE — Plan of Care (Signed)
  Problem: Education: Goal: Knowledge of General Education information will improve Description Including pain rating scale, medication(s)/side effects and non-pharmacologic comfort measures Outcome: Progressing   

## 2019-10-05 NOTE — Progress Notes (Signed)
Patient resting peacefully at this time, denies pain or discomfort. Will continue to monitor.

## 2019-10-06 LAB — TYPE AND SCREEN
ABO/RH(D): O POS
Antibody Screen: NEGATIVE
Unit division: 0
Unit division: 0
Unit division: 0

## 2019-10-06 LAB — BPAM RBC
Blood Product Expiration Date: 202101252359
Blood Product Expiration Date: 202101252359
Blood Product Expiration Date: 202102122359
ISSUE DATE / TIME: 202101211340
ISSUE DATE / TIME: 202101222025
ISSUE DATE / TIME: 202101231219
Unit Type and Rh: 5100
Unit Type and Rh: 9500
Unit Type and Rh: 9500

## 2019-10-07 DIAGNOSIS — E782 Mixed hyperlipidemia: Secondary | ICD-10-CM | POA: Diagnosis not present

## 2019-10-07 DIAGNOSIS — Z9861 Coronary angioplasty status: Secondary | ICD-10-CM | POA: Diagnosis not present

## 2019-10-07 DIAGNOSIS — I2510 Atherosclerotic heart disease of native coronary artery without angina pectoris: Secondary | ICD-10-CM | POA: Diagnosis not present

## 2019-10-07 DIAGNOSIS — R079 Chest pain, unspecified: Secondary | ICD-10-CM | POA: Diagnosis not present

## 2019-10-07 DIAGNOSIS — R55 Syncope and collapse: Secondary | ICD-10-CM | POA: Diagnosis not present

## 2019-10-07 DIAGNOSIS — S35229D Unspecified injury of superior mesenteric artery, subsequent encounter: Secondary | ICD-10-CM | POA: Diagnosis not present

## 2019-10-07 DIAGNOSIS — R1013 Epigastric pain: Secondary | ICD-10-CM | POA: Diagnosis not present

## 2019-10-07 DIAGNOSIS — R0602 Shortness of breath: Secondary | ICD-10-CM | POA: Diagnosis not present

## 2019-10-08 DIAGNOSIS — I25118 Atherosclerotic heart disease of native coronary artery with other forms of angina pectoris: Secondary | ICD-10-CM | POA: Diagnosis not present

## 2019-10-08 DIAGNOSIS — D649 Anemia, unspecified: Secondary | ICD-10-CM | POA: Diagnosis not present

## 2019-10-09 DIAGNOSIS — I25118 Atherosclerotic heart disease of native coronary artery with other forms of angina pectoris: Secondary | ICD-10-CM | POA: Diagnosis not present

## 2019-10-09 DIAGNOSIS — Z959 Presence of cardiac and vascular implant and graft, unspecified: Secondary | ICD-10-CM | POA: Diagnosis not present

## 2019-10-09 DIAGNOSIS — R238 Other skin changes: Secondary | ICD-10-CM | POA: Diagnosis not present

## 2019-10-09 DIAGNOSIS — D649 Anemia, unspecified: Secondary | ICD-10-CM | POA: Diagnosis not present

## 2019-10-10 DIAGNOSIS — R079 Chest pain, unspecified: Secondary | ICD-10-CM | POA: Diagnosis not present

## 2019-10-10 DIAGNOSIS — I2510 Atherosclerotic heart disease of native coronary artery without angina pectoris: Secondary | ICD-10-CM | POA: Diagnosis not present

## 2019-10-10 DIAGNOSIS — I10 Essential (primary) hypertension: Secondary | ICD-10-CM | POA: Diagnosis not present

## 2019-10-10 DIAGNOSIS — E782 Mixed hyperlipidemia: Secondary | ICD-10-CM | POA: Diagnosis not present

## 2019-10-10 DIAGNOSIS — R55 Syncope and collapse: Secondary | ICD-10-CM | POA: Diagnosis not present

## 2019-10-10 DIAGNOSIS — Z9861 Coronary angioplasty status: Secondary | ICD-10-CM | POA: Diagnosis not present

## 2019-10-10 DIAGNOSIS — R0602 Shortness of breath: Secondary | ICD-10-CM | POA: Diagnosis not present

## 2019-10-11 ENCOUNTER — Other Ambulatory Visit: Payer: Self-pay

## 2019-10-11 ENCOUNTER — Other Ambulatory Visit: Payer: Self-pay | Admitting: *Deleted

## 2019-10-11 ENCOUNTER — Encounter (INDEPENDENT_AMBULATORY_CARE_PROVIDER_SITE_OTHER): Payer: Self-pay | Admitting: Vascular Surgery

## 2019-10-11 ENCOUNTER — Ambulatory Visit (INDEPENDENT_AMBULATORY_CARE_PROVIDER_SITE_OTHER): Payer: Medicare HMO | Admitting: Vascular Surgery

## 2019-10-11 VITALS — BP 155/63 | HR 59 | Resp 16 | Wt 187.4 lb

## 2019-10-11 DIAGNOSIS — E1142 Type 2 diabetes mellitus with diabetic polyneuropathy: Secondary | ICD-10-CM

## 2019-10-11 DIAGNOSIS — K551 Chronic vascular disorders of intestine: Secondary | ICD-10-CM | POA: Diagnosis not present

## 2019-10-11 DIAGNOSIS — I10 Essential (primary) hypertension: Secondary | ICD-10-CM

## 2019-10-11 NOTE — Assessment & Plan Note (Signed)
With his worsening abdominal pain and some known elevated velocities previously on his SMA stent, I think it would be reasonable to go ahead and recheck his SMA stent at this time.  Duplex will be done in the near future at his convenience.  We will see the patient back following the study.

## 2019-10-11 NOTE — Progress Notes (Signed)
MRN : [id number]  Patrick Turner is a 74 y.o. (05/21/1946) male who presents with chief complaint of  Chief Complaint  Patient presents with   Follow-up    ref Humphrey Rolls abd pain  .  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  He initially did well after his SMA stent last year for several months.  Over the past couple of months however he is having worsening abdominal pain.  It is not consistently related to eating.  He gets it a lot with sitting and moving.  He had a CT scan performed by his cardiologist which was relatively unrevealing for abdominal problems that would be causing pain.  He had his SMA stent evaluated about 3 months ago and had some elevated velocities in the proximal portion of the stent, but this did not appear to be a high-grade stenosis.  With his worsening abdominal pain, he is referred back for further evaluation  Current Outpatient Medications  Medication Sig Dispense Refill   aspirin 81 MG tablet Take 1 tablet (81 mg total) by mouth daily.     atorvastatin (LIPITOR) 80 MG tablet Take 80 mg by mouth at bedtime.     b complex vitamins capsule Take 1 capsule by mouth daily.     clonazePAM (KLONOPIN) 1 MG tablet Take 0.5 mg by mouth at bedtime as needed for anxiety.   2   clopidogrel (PLAVIX) 75 MG tablet Take 1 tablet (75 mg total) by mouth daily. 30 tablet 11   gabapentin (NEURONTIN) 400 MG capsule Take 400 mg by mouth daily.      insulin detemir (LEVEMIR) 100 UNIT/ML injection Inject 15 Units into the skin daily.     iron polysaccharides (NIFEREX) 150 MG capsule Take 150 mg by mouth daily.     lisinopril-hydrochlorothiazide (PRINZIDE,ZESTORETIC) 20-12.5 MG per tablet Take 1 tablet by mouth daily.      metFORMIN (GLUCOPHAGE-XR) 500 MG 24 hr tablet Take 1,000 mg by mouth 2 (two) times daily with a meal.     metoprolol tartrate (LOPRESSOR) 25 MG tablet Take 0.5  tablets (12.5 mg total) by mouth 2 (two) times daily. 60 tablet 0   Omega-3 Fatty Acids (FISH OIL) 1000 MG CAPS Take 1,000 mg by mouth daily.      OZEMPIC, 0.25 OR 0.5 MG/DOSE, 2 MG/1.5ML SOPN Inject 1 Dose into the skin once a week.      pantoprazole (PROTONIX) 40 MG tablet Take 40 mg by mouth daily.     PARoxetine (PAXIL) 40 MG tablet Take 40 mg by mouth daily.      pioglitazone (ACTOS) 15 MG tablet Take 15 mg by mouth daily.      QUEtiapine (SEROQUEL) 100 MG tablet Take 100 mg by mouth at bedtime.     topiramate (TOPAMAX) 100 MG tablet Take 100 mg by mouth daily.      vitamin B-12 (CYANOCOBALAMIN) 1000 MCG tablet Take 1,000 mcg by mouth daily.     No current facility-administered medications for this visit.    Past Medical History:  Diagnosis Date   Altered mental status    Anemia    Anxiety    Aortic atherosclerosis (HCC)    Arthritis    Coronary artery disease    CRD (chronic renal disease)    Depression    Diabetes mellitus without complication (HCC)    Encephalopathy acute    GERD (gastroesophageal reflux  disease)    Headache    Hypercholesteremia    Hypertension    Neuropathy    Severe obesity (HCC)    Sleep apnea    Sleep terror    per patient this year per patient     Past Surgical History:  Procedure Laterality Date   APPENDECTOMY     CIRCUMCISION     COLONOSCOPY     COLONOSCOPY WITH PROPOFOL N/A 07/10/2018   Procedure: COLONOSCOPY WITH PROPOFOL;  Surgeon: Lollie Sails, MD;  Location: [facility] ENDOSCOPY;  Service: Endoscopy;  Laterality: N/A;   COLONOSCOPY WITH PROPOFOL N/A 04/23/2019   Procedure: COLONOSCOPY WITH PROPOFOL;  Surgeon: Lollie Sails, MD;  Location: [facility] ENDOSCOPY;  Service: Endoscopy;  Laterality: N/A;   CORONARY STENT INTERVENTION N/A 10/03/2019   Procedure: CORONARY STENT INTERVENTION;  Surgeon: Yolonda Kida, MD;  Location: [place] CV LAB;  Service: Cardiovascular;  Laterality: N/A;    ESOPHAGOGASTRODUODENOSCOPY     ESOPHAGOGASTRODUODENOSCOPY (EGD) WITH PROPOFOL N/A 04/23/2019   Procedure: ESOPHAGOGASTRODUODENOSCOPY (EGD) WITH PROPOFOL;  Surgeon: Lollie Sails, MD;  Location: [facility] ENDOSCOPY;  Service: Endoscopy;  Laterality: N/A;   JOINT REPLACEMENT     KNEE ARTHROPLASTY Right 06/27/2016   Procedure: COMPUTER ASSISTED TOTAL KNEE ARTHROPLASTY;  Surgeon: Dereck Leep, MD;  Location: [facility] ORS;  Service: Orthopedics;  Laterality: Right;   KNEE ARTHROSCOPY Right    LEFT HEART CATH AND CORONARY ANGIOGRAPHY N/A 10/03/2019   Procedure: Left Heart Cath and possible Coronary intervention;  Surgeon: Dionisio David, MD;  Location: [place] CV LAB;  Service: Cardiovascular;  Laterality: N/A;   TONSILLECTOMY     VISCERAL ANGIOGRAPHY N/A 05/23/2019   Procedure: VISCERAL ANGIOGRAPHY;  Surgeon: Algernon Huxley, MD;  Location: [place] CV LAB;  Service: Cardiovascular;  Laterality: N/A;     Social History   Tobacco Use   Smoking status: Never Smoker   Smokeless tobacco: Never Used  Substance Use Topics   Alcohol use: Yes    Comment: rare   Drug use: No    Family History  Problem Relation Age of Onset   Diabetes Mother    Heart disease Mother    Cancer Father    COPD Father      Allergies  Allergen Reactions   Penicillins Anaphylaxis    Has patient had a PCN reaction causing immediate rash, facial/tongue/throat swelling, SOB or lightheadedness with hypotension: Yes Has patient had a PCN reaction causing severe rash involving mucus membranes or skin necrosis: No Has patient had a PCN reaction that required hospitalization Yes Has patient had a PCN reaction occurring within the last 10 years: No If all of the above answers are "NO", then may proceed with Cephalosporin use.   Tavist [Clemastine] Swelling    REVIEW OF SYSTEMS (Negative unless checked)  Constitutional: [x]?Weight loss  []?Fever  []?Chills Cardiac: []?Chest pain   []?Chest  pressure   []?Palpitations   []?Shortness of breath when laying flat   []?Shortness of breath at rest   []?Shortness of breath with exertion. Vascular:  []?Pain in legs with walking   []?Pain in legs at rest   []?Pain in legs when laying flat   []?Claudication   []?Pain in feet when walking  []?Pain in feet at rest  []?Pain in feet when laying flat   []?History of DVT   []?Phlebitis   []?Swelling in legs   []?Varicose veins   []?Non-healing ulcers Pulmonary:   []?Uses home oxygen   []?Productive cough   []?Hemoptysis   []?  Wheeze  []?COPD   []?Asthma Neurologic:  []?Dizziness  []?Blackouts   []?Seizures   []?History of stroke   []?History of TIA  []?Aphasia   []?Temporary blindness   []?Dysphagia   []?Weakness or numbness in arms   []?Weakness or numbness in legs Musculoskeletal:  [x]?Arthritis   []?Joint swelling   [x]?Joint pain   []?Low back pain Hematologic:  []?Easy bruising  []?Easy bleeding   []?Hypercoagulable state   []?Anemic   Gastrointestinal:  []?Blood in stool   []?Vomiting blood  [x]?Gastroesophageal reflux/heartburn   [x]?Abdominal pain Genitourinary:  []?Chronic kidney disease   []?Difficult urination  []?Frequent urination  []?Burning with urination   []?Hematuria Skin:  []?Rashes   []?Ulcers   []?Wounds Psychological:  []?History of anxiety   []? History of major depression.  Physical Examination  BP (!) 155/63 (BP Location: Right Arm)    Pulse (!) 59    Resp 16    Wt 187 lb 6.4 oz (85 kg)    BMI 31.18 kg/m  Gen:  WD/WN, NAD Head: [place]/AT, No temporalis wasting. Ear/Nose/Throat: Hearing grossly intact, nares w/o erythema or drainage Eyes: Conjunctiva clear. Sclera non-icteric Neck: Supple.  Trachea midline Pulmonary:  Good air movement, no use of accessory muscles.  Cardiac: RRR, no JVD Vascular:  Vessel Right Left  Radial Palpable Palpable                                   Gastrointestinal: soft, he does have some tenderness to palpation in the left upper abdomen  and epigastric area.  No rebound or guarding Musculoskeletal: M/S 5/5 throughout.  No deformity or atrophy. No edema. Neurologic: Sensation grossly intact in extremities.  Symmetrical.  Speech is fluent.  Psychiatric: Judgment intact, Mood & affect appropriate for pt's clinical situation. Dermatologic: No rashes or ulcers noted.  No cellulitis or open wounds.       Labs Recent Results (from the past 2160 hour(s))  Basic metabolic panel     Status: Abnormal   Collection Time: 10/01/19  7:18 PM  Result Value Ref Range   Sodium 139 135 - 145 mmol/L   Potassium 4.8 3.5 - 5.1 mmol/L   Chloride 106 98 - 111 mmol/L   CO2 24 22 - 32 mmol/L   Glucose, Bld 108 (H) 70 - 99 mg/dL   BUN 35 (H) 8 - 23 mg/dL   Creatinine, Ser 1.62 (H) 0.61 - 1.24 mg/dL   Calcium 8.7 (L) 8.9 - 10.3 mg/dL   GFR calc non Af Amer 41 (L) >60 mL/min   GFR calc Af Amer 48 (L) >60 mL/min   Anion gap 9 5 - 15    Comment: Performed at [facility], [address] [place], [place] [address]  CBC with Differential     Status: Abnormal   Collection Time: 10/01/19  7:18 PM  Result Value Ref Range   WBC 8.9 4.0 - 10.5 K/uL   RBC 3.25 (L) 4.22 - 5.81 MIL/uL   Hemoglobin 8.0 (L) 13.0 - 17.0 g/dL   HCT 26.4 (L) 39.0 - 52.0 %   MCV 81.2 80.0 - 100.0 fL   MCH 24.6 (L) 26.0 - 34.0 pg   MCHC 30.3 30.0 - 36.0 g/dL   RDW 16.3 (H) 11.5 - 15.5 %   Platelets 254 150 - 400 K/uL   nRBC 0.0 0.0 - 0.2 %   Neutrophils Relative % 74 %   Neutro  Abs 6.5 1.7 - 7.7 K/uL   Lymphocytes Relative 15 %   Lymphs Abs 1.3 0.7 - 4.0 K/uL   Monocytes Relative 9 %   Monocytes Absolute 0.8 0.1 - 1.0 K/uL   Eosinophils Relative 2 %   Eosinophils Absolute 0.2 0.0 - 0.5 K/uL   Basophils Relative 0 %   Basophils Absolute 0.0 0.0 - 0.1 K/uL   Immature Granulocytes 0 %   Abs Immature Granulocytes 0.04 0.00 - 0.07 K/uL    Comment: Performed at [facility], [place], [place] [address]  Troponin I (High  Sensitivity)     Status: Abnormal   Collection Time: 10/01/19  7:18 PM  Result Value Ref Range   Troponin I (High Sensitivity) 24 (H) <18 ng/L    Comment: (NOTE) Elevated high sensitivity troponin I (hsTnI) values and significant  changes across serial measurements may suggest ACS but many other  chronic and acute conditions are known to elevate hsTnI results.  Refer to the "Links" section for chest pain algorithms and additional  guidance. Performed at [facility], [place], [place] [address]   Troponin I (High Sensitivity)     Status: Abnormal   Collection Time: 10/01/19  9:15 PM  Result Value Ref Range   Troponin I (High Sensitivity) 21 (H) <18 ng/L    Comment: (NOTE) Elevated high sensitivity troponin I (hsTnI) values and significant  changes across serial measurements may suggest ACS but many other  chronic and acute conditions are known to elevate hsTnI results.  Refer to the "Links" section for chest pain algorithms and additional  guidance. Performed at [facility], [place], [place] [address]   SARS CORONAVIRUS 2 (TAT 6-24 HRS) Nasopharyngeal Nasopharyngeal Swab     Status: None   Collection Time: 10/01/19 10:57 PM   Specimen: Nasopharyngeal Swab  Result Value Ref Range   SARS Coronavirus 2 NEGATIVE NEGATIVE    Comment: (NOTE) SARS-CoV-2 target nucleic acids are NOT DETECTED. The SARS-CoV-2 RNA is generally detectable in upper and lower respiratory specimens during the acute phase of infection. Negative results do not preclude SARS-CoV-2 infection, do not rule out co-infections with other pathogens, and should not be used as the sole basis for treatment or other patient management decisions. Negative results must be combined with clinical observations, patient history, and epidemiological information. The expected result is Negative. Fact Sheet for Patients: SugarRoll.be Fact Sheet for  Healthcare Providers: https://www.woods-mathews.com/ This test is not yet approved or cleared by the [place] FDA and  has been authorized for detection and/or diagnosis of SARS-CoV-2 by FDA under an Emergency Use Authorization (EUA). This EUA will remain  in effect (meaning this test can be used) for the duration of the COVID-19 declaration under Section 56 4(b)(1) of the Act, 21 U.S.C. section 360bbb-3(b)(1), unless the authorization is terminated or revoked sooner. Performed at [facility], [place] [address] [place], [place] [address]   Troponin I (High Sensitivity)     Status: Abnormal   Collection Time: 10/01/19 11:39 PM  Result Value Ref Range   Troponin I (High Sensitivity) 21 (H) <18 ng/L    Comment: (NOTE) Elevated high sensitivity troponin I (hsTnI) values and significant  changes across serial measurements may suggest ACS but many other  chronic and acute conditions are known to elevate hsTnI results.  Refer to the "Links" section for chest pain algorithms and additional  guidance. Performed at [facility], [address] [place], [place] [address]   Hemoglobin A1c     Status: Abnormal   Collection Time: 10/01/19 11:39 PM  Result Value Ref Range   Hgb A1c MFr Bld 7.8 (H) 4.8 - 5.6 %    Comment: (NOTE) Pre diabetes:          5.7%-6.4% Diabetes:              >6.4% Glycemic control for   <7.0% adults with diabetes    Mean Plasma Glucose 177.16 mg/dL    Comment: Performed at [facility], [place] [address], [place], [place] [address]  Glucose, capillary     Status: None   Collection Time: 10/01/19 11:57 PM  Result Value Ref Range   Glucose-Capillary 87 70 - 99 mg/dL  Glucose, capillary     Status: Abnormal   Collection Time: 10/02/19  7:21 AM  Result Value Ref Range   Glucose-Capillary 188 (H) 70 - 99 mg/dL  Glucose, capillary     Status: Abnormal   Collection Time: 10/02/19  1:07 PM  Result Value Ref Range   Glucose-Capillary  162 (H) 70 - 99 mg/dL  APTT     Status: None   Collection Time: 10/02/19  1:16 PM  Result Value Ref Range   aPTT 34 24 - 36 seconds    Comment: Performed at [facility], [address] [place], [place] [address]  Protime-INR     Status: None   Collection Time: 10/02/19  1:16 PM  Result Value Ref Range   Prothrombin Time 13.6 11.4 - 15.2 seconds   INR 1.1 0.8 - 1.2    Comment: (NOTE) INR goal varies based on device and disease states. Performed at [facility], [address] [place], [place] [address]   Glucose, capillary     Status: Abnormal   Collection Time: 10/02/19  4:44 PM  Result Value Ref Range   Glucose-Capillary 115 (H) 70 - 99 mg/dL  Glucose, capillary     Status: Abnormal   Collection Time: 10/02/19  8:49 PM  Result Value Ref Range   Glucose-Capillary 192 (H) 70 - 99 mg/dL  Heparin level (unfractionated)     Status: None   Collection Time: 10/02/19  8:59 PM  Result Value Ref Range   Heparin Unfractionated 0.35 0.30 - 0.70 IU/mL    Comment: (NOTE) If heparin results are below expected values, and patient dosage has  been confirmed, suggest follow up testing of antithrombin III levels. Performed at [facility], [place], [place] [address]   Troponin I (High Sensitivity)     Status: None   Collection Time: 10/03/19 12:08 AM  Result Value Ref Range   Troponin I (High Sensitivity) 12 <18 ng/L    Comment: (NOTE) Elevated high sensitivity troponin I (hsTnI) values and significant  changes across serial measurements may suggest ACS but many other  chronic and acute conditions are known to elevate hsTnI results.  Refer to the "Links" section for chest pain algorithms and additional  guidance. Performed at [facility], [address] [place], [place] [address]   Basic metabolic panel     Status: Abnormal   Collection Time: 10/03/19  1:49 AM  Result Value Ref Range   Sodium 137 135 - 145 mmol/L    Potassium 4.3 3.5 - 5.1 mmol/L   Chloride 108 98 - 111 mmol/L   CO2 21 (L) 22 - 32 mmol/L   Glucose, Bld 164 (H) 70 - 99  mg/dL   BUN 31 (H) 8 - 23 mg/dL   Creatinine, Ser 1.45 (H) 0.61 - 1.24 mg/dL   Calcium 8.5 (L) 8.9 - 10.3 mg/dL   GFR calc non Af Amer 47 (L) >60 mL/min   GFR calc Af Amer 55 (L) >60 mL/min   Anion gap 8 5 - 15    Comment: Performed at [facility], [address] [place], [place] [address]  Magnesium     Status: None   Collection Time: 10/03/19  1:49 AM  Result Value Ref Range   Magnesium 2.0 1.7 - 2.4 mg/dL    Comment: Performed at [facility], [address] [place], [place] [address]  CBC     Status: Abnormal   Collection Time: 10/03/19  1:49 AM  Result Value Ref Range   WBC 6.6 4.0 - 10.5 K/uL   RBC 2.77 (L) 4.22 - 5.81 MIL/uL   Hemoglobin 6.7 (L) 13.0 - 17.0 g/dL   HCT 22.0 (L) 39.0 - 52.0 %   MCV 79.4 (L) 80.0 - 100.0 fL   MCH 24.2 (L) 26.0 - 34.0 pg   MCHC 30.5 30.0 - 36.0 g/dL   RDW 16.2 (H) 11.5 - 15.5 %   Platelets 221 150 - 400 K/uL   nRBC 0.0 0.0 - 0.2 %    Comment: Performed at [facility], [place], [place] [address]  Heparin level (unfractionated)     Status: None   Collection Time: 10/03/19  1:49 AM  Result Value Ref Range   Heparin Unfractionated 0.39 0.30 - 0.70 IU/mL    Comment: (NOTE) If heparin results are below expected values, and patient dosage has  been confirmed, suggest follow up testing of antithrombin III levels. Performed at [facility], [place], [place] [address]   Troponin I (High Sensitivity)     Status: None   Collection Time: 10/03/19  1:49 AM  Result Value Ref Range   Troponin I (High Sensitivity) 12 <18 ng/L    Comment: (NOTE) Elevated high sensitivity troponin I (hsTnI) values and significant  changes across serial measurements may suggest ACS but many other  chronic and acute conditions are known to elevate hsTnI results.  Refer to the  "Links" section for chest pain algorithms and additional  guidance. Performed at [facility], [address] [place], [place] [address]   Glucose, capillary     Status: Abnormal   Collection Time: 10/03/19  7:18 AM  Result Value Ref Range   Glucose-Capillary 138 (H) 70 - 99 mg/dL  Prepare RBC     Status: None   Collection Time: 10/03/19  7:24 AM  Result Value Ref Range   Order Confirmation      ORDER PROCESSED BY BLOOD BANK Performed at [facility], [address], [place], [place] [address]   POCT Activated clotting time     Status: None   Collection Time: 10/03/19  8:31 AM  Result Value Ref Range   Activated Clotting Time 362 seconds  Occult blood card to lab, stool     Status: None   Collection Time: 10/03/19  9:24 AM  Result Value Ref Range   Fecal Occult Bld NEGATIVE NEGATIVE    Comment: Performed at [facility], [address] [place], [place] [address]  Glucose, capillary     Status: Abnormal   Collection Time: 10/03/19  9:55 AM  Result Value Ref Range   Glucose-Capillary 158 (H) 70 -  99 mg/dL  Basic metabolic panel     Status: Abnormal   Collection Time: 10/03/19 11:25 AM  Result Value Ref Range   Sodium 136 135 - 145 mmol/L   Potassium 4.6 3.5 - 5.1 mmol/L   Chloride 107 98 - 111 mmol/L   CO2 23 22 - 32 mmol/L   Glucose, Bld 257 (H) 70 - 99 mg/dL   BUN 27 (H) 8 - 23 mg/dL   Creatinine, Ser 1.39 (H) 0.61 - 1.24 mg/dL   Calcium 8.2 (L) 8.9 - 10.3 mg/dL   GFR calc non Af Amer 50 (L) >60 mL/min   GFR calc Af Amer 57 (L) >60 mL/min   Anion gap 6 5 - 15    Comment: Performed at [facility], [address] [place], [place] [address]  Protime-INR     Status: Abnormal   Collection Time: 10/03/19 11:25 AM  Result Value Ref Range   Prothrombin Time 18.6 (H) 11.4 - 15.2 seconds   INR 1.6 (H) 0.8 - 1.2    Comment: (NOTE) INR goal varies based on device and disease states. Performed at [facility], [address] [place], [place] [address]   CBC     Status: Abnormal   Collection Time: 10/03/19 11:25 AM  Result Value Ref Range   WBC 5.6 4.0 - 10.5 K/uL   RBC 2.86 (L) 4.22 - 5.81 MIL/uL   Hemoglobin 6.9 (L) 13.0 - 17.0 g/dL   HCT 23.2 (L) 39.0 - 52.0 %   MCV 81.1 80.0 - 100.0 fL   MCH 24.1 (L) 26.0 - 34.0 pg   MCHC 29.7 (L) 30.0 - 36.0 g/dL   RDW 16.5 (H) 11.5 - 15.5 %   Platelets 240 150 - 400 K/uL   nRBC 0.0 0.0 - 0.2 %    Comment: Performed at [facility], [address] [place], [place] [address]  Vitamin B12     Status: None   Collection Time: 10/03/19 11:25 AM  Result Value Ref Range   Vitamin B-12 432 180 - 914 pg/mL    Comment: (NOTE) This assay is not validated for testing neonatal or myeloproliferative syndrome specimens for Vitamin B12 levels. Performed at [facility], [place] [address], [place], [place] [address]   Folate     Status: None   Collection Time: 10/03/19 11:25 AM  Result Value Ref Range   Folate 12.8 >5.9 ng/mL    Comment: Performed at [facility], [address] [place], [place] [address]  Iron and TIBC     Status: Abnormal   Collection Time: 10/03/19 11:25 AM  Result Value Ref Range   Iron 17 (L) 45 - 182 ug/dL   TIBC 391 250 - 450 ug/dL   Saturation Ratios 4 (L) 17.9 - 39.5 %   UIBC 374 ug/dL    Comment: Performed at [facility], [address] [place], [place] [address]  Ferritin     Status: Abnormal   Collection Time: 10/03/19 11:25 AM  Result Value Ref Range   Ferritin 9 (L) 24 - 336 ng/mL    Comment: Performed at [facility], [address] [place], [place] [address]  Reticulocytes     Status: Abnormal   Collection Time: 10/03/19 11:25 AM  Result Value Ref Range   Retic Ct Pct 1.2 0.4 - 3.1 %   RBC. 2.81 (L) 4.22 - 5.81 MIL/uL   Retic Count, Absolute 33.2 19.0 - 186.0 K/uL  Immature Retic Fract 14.8 2.3 - 15.9 %    Comment: REPEATED TO VERIFY Performed at [facility], [address] [place], [place] [address]   Type and screen [place]     Status: None   Collection Time: 10/03/19 11:25 AM  Result Value Ref Range   ABO/RH(D) O POS    Antibody Screen NEG    Sample Expiration 10/06/2019,2359    Unit Number [id number]    Blood Component Type RED CELLS,LR    Unit division 00    Status of Unit ISSUED,FINAL    Transfusion Status OK TO TRANSFUSE    Crossmatch Result Compatible    Unit Number [id number]    Blood Component Type RED CELLS,LR    Unit division 00    Status of Unit ISSUED,FINAL    Transfusion Status OK TO TRANSFUSE    Crossmatch Result Compatible    Unit Number [id number]    Blood Component Type RED CELLS,LR    Unit division 00    Status of Unit ISSUED,FINAL    Transfusion Status OK TO TRANSFUSE    Crossmatch Result      Compatible Performed at [facility], [address] [place], [place] [address]   BPAM RBC     Status: None   Collection Time: 10/03/19 11:25 AM  Result Value Ref Range   ISSUE DATE / TIME [id number]    Blood Product Unit Number [id number]    PRODUCT CODE [id number]    Unit Type and Rh 9500    Blood Product Expiration Date 202101252359    ISSUE DATE / TIME 419622297989    Blood Product Unit Number [id number]    PRODUCT CODE [id number]    Unit Type and Rh 9500    Blood Product Expiration Date 202101252359    ISSUE DATE / TIME 818563149702    Blood Product Unit Number [id number]    PRODUCT CODE [id number]    Unit Type and Rh 5100    Blood Product Expiration Date 720947096283   Glucose, capillary     Status: Abnormal   Collection Time: 10/03/19 11:40 AM  Result Value Ref Range   Glucose-Capillary 227 (H) 70 - 99 mg/dL  Glucose, capillary     Status: Abnormal   Collection Time: 10/03/19  4:30 PM  Result Value Ref Range   Glucose-Capillary 137 (H) 70 - 99 mg/dL  Hemoglobin and hematocrit, blood     Status: Abnormal   Collection Time: 10/03/19  7:14 PM  Result  Value Ref Range   Hemoglobin 8.3 (L) 13.0 - 17.0 g/dL   HCT 26.9 (L) 39.0 - 52.0 %    Comment: Performed at [facility], [address] [place], [place] [address]  Glucose, capillary     Status: Abnormal   Collection Time: 10/03/19  9:01 PM  Result Value Ref Range   Glucose-Capillary 232 (H) 70 - 99 mg/dL  CBC     Status: Abnormal   Collection Time: 10/04/19  6:36 AM  Result Value Ref Range   WBC 7.6 4.0 - 10.5 K/uL   RBC 3.03 (L) 4.22 - 5.81 MIL/uL   Hemoglobin 7.6 (L) 13.0 - 17.0 g/dL   HCT 24.5 (L) 39.0 - 52.0 %   MCV 80.9 80.0 - 100.0 fL   MCH 25.1 (L) 26.0 - 34.0 pg   MCHC 31.0 30.0 - 36.0 g/dL   RDW 16.4 (H) 11.5 - 15.5 %   Platelets 224 150 - 400 K/uL   nRBC  0.0 0.0 - 0.2 %    Comment: Performed at [facility], [address] [place], [place] [address]  Basic metabolic panel     Status: Abnormal   Collection Time: 10/04/19  6:36 AM  Result Value Ref Range   Sodium 138 135 - 145 mmol/L   Potassium 4.4 3.5 - 5.1 mmol/L   Chloride 109 98 - 111 mmol/L   CO2 22 22 - 32 mmol/L   Glucose, Bld 180 (H) 70 - 99 mg/dL   BUN 31 (H) 8 - 23 mg/dL   Creatinine, Ser 1.63 (H) 0.61 - 1.24 mg/dL   Calcium 8.4 (L) 8.9 - 10.3 mg/dL   GFR calc non Af Amer 41 (L) >60 mL/min   GFR calc Af Amer 47 (L) >60 mL/min   Anion gap 7 5 - 15    Comment: Performed at [facility], [address] [place], [place] [address]  Glucose, capillary     Status: Abnormal   Collection Time: 10/04/19  8:00 AM  Result Value Ref Range   Glucose-Capillary 162 (H) 70 - 99 mg/dL  Glucose, capillary     Status: Abnormal   Collection Time: 10/04/19 11:33 AM  Result Value Ref Range   Glucose-Capillary 228 (H) 70 - 99 mg/dL  Glucose, capillary     Status: Abnormal   Collection Time: 10/04/19  4:58 PM  Result Value Ref Range   Glucose-Capillary 112 (H) 70 - 99 mg/dL  Sample to Blood Bank     Status: None   Collection Time: 10/04/19  6:43 PM  Result Value Ref Range   Blood Bank  Specimen SAMPLE AVAILABLE FOR TESTING    Sample Expiration      10/07/2019,2359 Performed at [facility], [address] [place], [place] [address]   Glucose, capillary     Status: Abnormal   Collection Time: 10/04/19  9:11 PM  Result Value Ref Range   Glucose-Capillary 227 (H) 70 - 99 mg/dL  Glucose, capillary     Status: Abnormal   Collection Time: 10/05/19  8:08 AM  Result Value Ref Range   Glucose-Capillary 151 (H) 70 - 99 mg/dL  Hemoglobin and hematocrit, blood     Status: Abnormal   Collection Time: 10/05/19  9:20 AM  Result Value Ref Range   Hemoglobin 7.9 (L) 13.0 - 17.0 g/dL   HCT 24.9 (L) 39.0 - 52.0 %    Comment: Performed at [facility], [address], [place], [place] [address]  Prepare RBC     Status: None   Collection Time: 10/05/19 10:30 AM  Result Value Ref Range   Order Confirmation      ORDER PROCESSED BY BLOOD BANK Performed at [facility], [address] [place], [place] [address]   Glucose, capillary     Status: Abnormal   Collection Time: 10/05/19 11:27 AM  Result Value Ref Range   Glucose-Capillary 127 (H) 70 - 99 mg/dL  Hemoglobin and hematocrit, blood     Status: Abnormal   Collection Time: 10/05/19  3:24 PM  Result Value Ref Range   Hemoglobin 9.4 (L) 13.0 - 17.0 g/dL   HCT 30.2 (L) 39.0 - 52.0 %    Comment: Performed at [facility], [address], [place],  [address]    Radiology DG Chest 2 View  Result Date: 10/01/2019 CLINICAL DATA:  Chest pain EXAM: CHEST - 2 VIEW COMPARISON:  12/24/2018 FINDINGS: Mildly low lung volumes. No focal opacity or pleural  effusion. Stable cardiomediastinal silhouette. No pneumothorax. IMPRESSION: No active cardiopulmonary disease.  Low lung volumes. Electronically Signed   By: Donavan Foil M.D.   On: 10/01/2019 20:13   CT Head Wo Contrast  Result Date: 10/01/2019 CLINICAL DATA:  [place] and hit head EXAM: CT HEAD WITHOUT CONTRAST CT CERVICAL SPINE WITHOUT  CONTRAST TECHNIQUE: Multidetector CT imaging of the head and cervical spine was performed following the standard protocol without intravenous contrast. Multiplanar CT image reconstructions of the cervical spine were also generated. COMPARISON:  CT 12/24/2018, 11/05/2015 FINDINGS: CT HEAD FINDINGS Brain: No acute territorial infarction, hemorrhage, or intracranial mass. Mild atrophy. Normal ventricle size. Vascular: No hyperdense vessels.  Carotid vascular calcification Skull: Normal. Negative for fracture or focal lesion. Sinuses/Orbits: Mild mucosal thickening in the ethmoid sinuses Other: None CT CERVICAL SPINE FINDINGS Alignment: Straightening of the cervical spine. No subluxation. Facet alignment is normal Skull base and vertebrae: No acute fracture. No primary bone lesion or focal pathologic process. Soft tissues and spinal canal: No prevertebral fluid or swelling. No visible canal hematoma. Disc levels: Moderate severe degenerative changes at C5-C6, C6-C7 and C7 T1. Bilateral foraminal narrowing at these levels. Upper chest: Negative. Other: None IMPRESSION: 1. No CT evidence for acute intracranial abnormality.  Mild atrophy 2. Straightening of the cervical spine with degenerative changes. No acute osseous abnormality. Electronically Signed   By: Donavan Foil M.D.   On: 10/01/2019 20:10   CT Cervical Spine Wo Contrast  Result Date: 10/01/2019 CLINICAL DATA:  [place] and hit head EXAM: CT HEAD WITHOUT CONTRAST CT CERVICAL SPINE WITHOUT CONTRAST TECHNIQUE: Multidetector CT imaging of the head and cervical spine was performed following the standard protocol without intravenous contrast. Multiplanar CT image reconstructions of the cervical spine were also generated. COMPARISON:  CT 12/24/2018, 11/05/2015 FINDINGS: CT HEAD FINDINGS Brain: No acute territorial infarction, hemorrhage, or intracranial mass. Mild atrophy. Normal ventricle size. Vascular: No hyperdense vessels.  Carotid vascular calcification Skull:  Normal. Negative for fracture or focal lesion. Sinuses/Orbits: Mild mucosal thickening in the ethmoid sinuses Other: None CT CERVICAL SPINE FINDINGS Alignment: Straightening of the cervical spine. No subluxation. Facet alignment is normal Skull base and vertebrae: No acute fracture. No primary bone lesion or focal pathologic process. Soft tissues and spinal canal: No prevertebral fluid or swelling. No visible canal hematoma. Disc levels: Moderate severe degenerative changes at C5-C6, C6-C7 and C7 T1. Bilateral foraminal narrowing at these levels. Upper chest: Negative. Other: None IMPRESSION: 1. No CT evidence for acute intracranial abnormality.  Mild atrophy 2. Straightening of the cervical spine with degenerative changes. No acute osseous abnormality. Electronically Signed   By: Donavan Foil M.D.   On: 10/01/2019 20:10   CARDIAC CATHETERIZATION  Result Date: 10/04/2019  Prox LAD to Mid LAD lesion is 80% stenosed.  Dist LAD lesion is 90% stenosed.  A drug-eluting stent was successfully placed using a STENT RESOLUTE ONYX [id number].  Post intervention, there is a 0% residual stenosis.  A drug-eluting stent was successfully placed using a [place] 8.45X64.  Post intervention, there is a 0% residual stenosis.  Conclusion Ad hoc PCI and stent mid and distal LAD lesions 80% long tubular mid lesion 90% distal focal irregular lesion Placement of DES stent to the mid LAD 2.5 x 38 mm resolute Onyx to 13 atm 80% reduced to 0% Placement of DES stent to distal LAD lesion 2.25 x 12 mm resolute Onyx 13 atm 90% reduced to 0% TIMI-3 flow maintained throughout the procedure Minx deployed No complications  CARDIAC CATHETERIZATION  Result Date: 10/03/2019  Prox RCA lesion is 60% stenosed.  1st Mrg lesion is 90% stenosed.  Ost Cx to Prox Cx lesion is 45% stenosed.  Mid LAD lesion is 70% stenosed.  Dist LAD lesion is 80% stenosed.  PCI of the LAD/drug-eluting stenting.   DG Shoulder Left  Result Date:  10/01/2019 CLINICAL DATA:  Chest pain shoulder pain EXAM: LEFT SHOULDER - 2+ VIEW COMPARISON:  None. FINDINGS: No fracture or malalignment. Mild calcific tendinitis. Mild AC joint degenerative change. IMPRESSION: No acute osseous abnormality Electronically Signed   By: Donavan Foil M.D.   On: 10/01/2019 20:15    Assessment/Plan HTN (hypertension), benign blood pressure control important in reducing the progression of atherosclerotic disease. On appropriate oral medications.   DM type 2 with diabetic peripheral neuropathy (HCC) blood glucose control important in reducing the progression of atherosclerotic disease. Also, involved in wound healing. On appropriate medications.  Chronic mesenteric ischemia (HCC) With his worsening abdominal pain and some known elevated velocities previously on his SMA stent, I think it would be reasonable to go ahead and recheck his SMA stent at this time.  Duplex will be done in the near future at his convenience.  We will see the patient back following the study.    [name], MD  10/11/2019 11:10 AM    This note was created with Dragon medical transcription system.  Any errors from dictation are purely unintentional

## 2019-10-11 NOTE — Patient Outreach (Signed)
[facility]) Care Management  10/11/2019  NASAI DIVEN 1945-11-10 [id number]   EMMI Red Flag  Day:4 Date:1/28 Reason :  Who reached Patient  Know who to call about changes in condition? No  Other questions/problems? Yes   Transition of care by PCP office   Outreach attempt #1 Subjective : Successful outreach call to patient , explained reason for the call on today. Reviewed EMMI automated calls after discharge , patient reports that he does not recall receiving calls but he has received several calls since discharge. He discussed being aware who to contact with problems, He discussed already being in contact with PCP.   Patient discussed recent hospital admission for heart attack, stents placed , and discussed low blood count with blood transfusion. He denies chest pain or shortness of breath. He voiced concern related to bruising on right thigh area, and has followed up with PCP. He reports plan for referral to GI MD regarding concern for low hemoglobin during hospital stay. Patient denies abdominal pain at this time  nausea no signs of bleeding .   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   He further discussed :  Patient states he lives  at home with his wife, he works at [employer], but is currently on Leave of absence due to recent hospitalization. Patient discussed recent increased episodes of falls, states he right legs just buckles. States most recent fall on yesterday with no injury.  He reports right leg being numb and just gives way,  denies dizziness  he reports having stents placed in October by Dr. Lucky Cowboy. He reports having visit on today with Dr.Dew.  and plans for further testing on next week.   Discussed fall prevention measures, patient states that he uses a cane at this time.  Patient discussed working on trying to keep blood pressure under control as well as Diabetes, he voiced being  aware of that his average is still a little high.    Medications Patient reports having all medications and taking as prescribed. He denies cost concerns, he is unable to review medications at this time.   Consent Explained and offered [facility] care management services of management of chronic conditions and follow up after post discharge. Patient is agreeable to follow up, provided contact my contact information .   Plan Will plan return call in the next week for follow up and complete assessment .  Will send patient [facility] welcome letter .  THN CM Care Plan Problem One     Most Recent Value  Care Plan Problem One  Patient with risk for readmission related to recent hospital admission for chest pain/stent placement   Role Documenting the Problem One  Care Management [place] for Problem One  Active  [facility] Long Term Goal   Patient will not experience a hospital admission over the next 31 days   [facility] Long Term Goal Start Date  10/11/19  Interventions for Problem One Long Term Goal  Reviewed [facility] care management progam and outreach with contact numbers. Advised taking medications as prescribed, urged notifying MD of any new or worsening symptoms sooner to be addressed, seeking medical attention for recurrent chest pain, shortness of breath, increased weakness, dizziness  or signs of bleeding.   THN CM Short Term Goal #1   Over the next 20 days patient will report attending all medical appointments  [facility] CM Short Term Goal #1 Start  Date  10/11/19  Interventions for Short Term Goal #1  Reviewed recent medical appointments, discussed referral pending and xray . Discussed transporation to visits.    THN CM Short Term Goal #2   Patient will report decrease in falls over the next 30 days as evidenced by report.    THN CM Short Term Goal #2 Start Date  10/11/19  Interventions for Short Term Goal #2  Discussed fall prevention measures, use of cane at all times, encouraged use of ramped  entrances or level entrances versus using stairs. Encouraged to notify MD of increase in falls or falls with injury.       Joylene Draft, RN, BSN  [place] Management Coordinator  [phone]- Mobile [phone]- Toll Free Main Office

## 2019-10-13 ENCOUNTER — Emergency Department: Payer: Medicare HMO

## 2019-10-13 ENCOUNTER — Encounter: Payer: Self-pay | Admitting: Emergency Medicine

## 2019-10-13 ENCOUNTER — Observation Stay
Admission: EM | Admit: 2019-10-13 | Discharge: 2019-10-14 | Disposition: A | Discharge status: Home / Self Care | Payer: Medicare HMO | Attending: Family Medicine | Admitting: Family Medicine

## 2019-10-13 ENCOUNTER — Other Ambulatory Visit: Payer: Self-pay

## 2019-10-13 DIAGNOSIS — I10 Essential (primary) hypertension: Secondary | ICD-10-CM | POA: Diagnosis present

## 2019-10-13 DIAGNOSIS — Z7982 Long term (current) use of aspirin: Secondary | ICD-10-CM | POA: Insufficient documentation

## 2019-10-13 DIAGNOSIS — Z888 Allergy status to other drugs, medicaments and biological substances status: Secondary | ICD-10-CM | POA: Insufficient documentation

## 2019-10-13 DIAGNOSIS — F329 Major depressive disorder, single episode, unspecified: Secondary | ICD-10-CM | POA: Diagnosis not present

## 2019-10-13 DIAGNOSIS — N189 Chronic kidney disease, unspecified: Secondary | ICD-10-CM | POA: Diagnosis not present

## 2019-10-13 DIAGNOSIS — M79604 Pain in right leg: Secondary | ICD-10-CM | POA: Insufficient documentation

## 2019-10-13 DIAGNOSIS — F419 Anxiety disorder, unspecified: Secondary | ICD-10-CM | POA: Diagnosis not present

## 2019-10-13 DIAGNOSIS — N179 Acute kidney failure, unspecified: Secondary | ICD-10-CM | POA: Diagnosis not present

## 2019-10-13 DIAGNOSIS — N183 Chronic kidney disease, stage 3 unspecified: Secondary | ICD-10-CM | POA: Diagnosis not present

## 2019-10-13 DIAGNOSIS — G4733 Obstructive sleep apnea (adult) (pediatric): Secondary | ICD-10-CM | POA: Insufficient documentation

## 2019-10-13 DIAGNOSIS — Z20822 Contact with and (suspected) exposure to covid-19: Secondary | ICD-10-CM | POA: Insufficient documentation

## 2019-10-13 DIAGNOSIS — Z96651 Presence of right artificial knee joint: Secondary | ICD-10-CM | POA: Diagnosis not present

## 2019-10-13 DIAGNOSIS — I729 Aneurysm of unspecified site: Secondary | ICD-10-CM

## 2019-10-13 DIAGNOSIS — M19071 Primary osteoarthritis, right ankle and foot: Secondary | ICD-10-CM | POA: Insufficient documentation

## 2019-10-13 DIAGNOSIS — I724 Aneurysm of artery of lower extremity: Secondary | ICD-10-CM | POA: Diagnosis present

## 2019-10-13 DIAGNOSIS — R0789 Other chest pain: Secondary | ICD-10-CM | POA: Diagnosis not present

## 2019-10-13 DIAGNOSIS — E1122 Type 2 diabetes mellitus with diabetic chronic kidney disease: Secondary | ICD-10-CM | POA: Insufficient documentation

## 2019-10-13 DIAGNOSIS — Z7902 Long term (current) use of antithrombotics/antiplatelets: Secondary | ICD-10-CM | POA: Diagnosis not present

## 2019-10-13 DIAGNOSIS — E875 Hyperkalemia: Secondary | ICD-10-CM | POA: Diagnosis not present

## 2019-10-13 DIAGNOSIS — I129 Hypertensive chronic kidney disease with stage 1 through stage 4 chronic kidney disease, or unspecified chronic kidney disease: Secondary | ICD-10-CM | POA: Insufficient documentation

## 2019-10-13 DIAGNOSIS — E785 Hyperlipidemia, unspecified: Secondary | ICD-10-CM | POA: Diagnosis present

## 2019-10-13 DIAGNOSIS — M79671 Pain in right foot: Secondary | ICD-10-CM

## 2019-10-13 DIAGNOSIS — M7989 Other specified soft tissue disorders: Secondary | ICD-10-CM | POA: Diagnosis not present

## 2019-10-13 DIAGNOSIS — Z6835 Body mass index (BMI) 35.0-35.9, adult: Secondary | ICD-10-CM | POA: Insufficient documentation

## 2019-10-13 DIAGNOSIS — W19XXXA Unspecified fall, initial encounter: Secondary | ICD-10-CM | POA: Insufficient documentation

## 2019-10-13 DIAGNOSIS — Z833 Family history of diabetes mellitus: Secondary | ICD-10-CM | POA: Insufficient documentation

## 2019-10-13 DIAGNOSIS — R2241 Localized swelling, mass and lump, right lower limb: Secondary | ICD-10-CM | POA: Diagnosis not present

## 2019-10-13 DIAGNOSIS — I700 Atherosclerosis of aorta: Secondary | ICD-10-CM | POA: Insufficient documentation

## 2019-10-13 DIAGNOSIS — Z79899 Other long term (current) drug therapy: Secondary | ICD-10-CM | POA: Insufficient documentation

## 2019-10-13 DIAGNOSIS — E1142 Type 2 diabetes mellitus with diabetic polyneuropathy: Secondary | ICD-10-CM | POA: Diagnosis not present

## 2019-10-13 DIAGNOSIS — Z955 Presence of coronary angioplasty implant and graft: Secondary | ICD-10-CM | POA: Diagnosis not present

## 2019-10-13 DIAGNOSIS — T81718A Complication of other artery following a procedure, not elsewhere classified, initial encounter: Secondary | ICD-10-CM

## 2019-10-13 DIAGNOSIS — Z794 Long term (current) use of insulin: Secondary | ICD-10-CM | POA: Insufficient documentation

## 2019-10-13 DIAGNOSIS — K219 Gastro-esophageal reflux disease without esophagitis: Secondary | ICD-10-CM | POA: Diagnosis not present

## 2019-10-13 DIAGNOSIS — Z880 Allergy status to penicillin: Secondary | ICD-10-CM | POA: Insufficient documentation

## 2019-10-13 DIAGNOSIS — I2510 Atherosclerotic heart disease of native coronary artery without angina pectoris: Secondary | ICD-10-CM | POA: Insufficient documentation

## 2019-10-13 DIAGNOSIS — R079 Chest pain, unspecified: Secondary | ICD-10-CM | POA: Diagnosis not present

## 2019-10-13 DIAGNOSIS — R55 Syncope and collapse: Secondary | ICD-10-CM | POA: Diagnosis not present

## 2019-10-13 DIAGNOSIS — Z8249 Family history of ischemic heart disease and other diseases of the circulatory system: Secondary | ICD-10-CM | POA: Insufficient documentation

## 2019-10-13 LAB — BASIC METABOLIC PANEL
Anion gap: 13 (ref 5–15)
BUN: 42 mg/dL — ABNORMAL HIGH (ref 8–23)
CO2: 19 mmol/L — ABNORMAL LOW (ref 22–32)
Calcium: 9.1 mg/dL (ref 8.9–10.3)
Chloride: 106 mmol/L (ref 98–111)
Creatinine, Ser: 2.21 mg/dL — ABNORMAL HIGH (ref 0.61–1.24)
GFR calc Af Amer: 33 mL/min — ABNORMAL LOW (ref >60)
GFR calc non Af Amer: 28 mL/min — ABNORMAL LOW (ref >60)
Glucose, Bld: 155 mg/dL — ABNORMAL HIGH (ref 70–99)
Potassium: 5.3 mmol/L — ABNORMAL HIGH (ref 3.5–5.1)
Sodium: 138 mmol/L (ref 135–145)

## 2019-10-13 LAB — CBC
HCT: 36.9 % — ABNORMAL LOW (ref 39.0–52.0)
Hemoglobin: 11.4 g/dL — ABNORMAL LOW (ref 13.0–17.0)
MCH: 26.9 pg (ref 26.0–34.0)
MCHC: 30.9 g/dL (ref 30.0–36.0)
MCV: 87 fL (ref 80.0–100.0)
Platelets: 368 10*3/uL (ref 150–400)
RBC: 4.24 MIL/uL (ref 4.22–5.81)
RDW: 21.5 % — ABNORMAL HIGH (ref 11.5–15.5)
WBC: 7.5 10*3/uL (ref 4.0–10.5)
nRBC: 0 % (ref 0.0–0.2)

## 2019-10-13 LAB — TROPONIN I (HIGH SENSITIVITY): Troponin I (High Sensitivity): 4 ng/L (ref <18)

## 2019-10-13 MED ORDER — HEPARIN SODIUM (PORCINE) 5000 UNIT/ML IJ SOLN
5000.0000 [IU] | Freq: Three times a day (TID) | INTRAMUSCULAR | Status: DC
  Administered 2019-10-13 – 2019-10-14 (×2): 5000 [IU] via SUBCUTANEOUS
  Filled 2019-10-13 (×2): qty 1

## 2019-10-13 MED ORDER — SODIUM CHLORIDE 0.9 % IV SOLN
1000.0000 mL | Freq: Once | INTRAVENOUS | Status: AC
  Administered 2019-10-13: 18:00:00 1000 mL via INTRAVENOUS

## 2019-10-13 MED ORDER — SODIUM CHLORIDE 0.9% FLUSH: 3.0000 mL | Freq: Once | INTRAVENOUS | Status: DC

## 2019-10-13 MED ORDER — ACETAMINOPHEN 650 MG RE SUPP: 650.0000 mg | Freq: Four times a day (QID) | RECTAL | Status: DC | PRN

## 2019-10-13 MED ORDER — ACETAMINOPHEN 325 MG PO TABS
650.0000 mg | ORAL_TABLET | Freq: Four times a day (QID) | ORAL | Status: DC | PRN
  Administered 2019-10-14: 650 mg via ORAL
  Filled 2019-10-13: qty 2

## 2019-10-13 MED ORDER — OXYCODONE HCL 5 MG PO TABS
5.0000 mg | ORAL_TABLET | ORAL | Status: DC | PRN
  Administered 2019-10-14: 04:00:00 5 mg via ORAL
  Filled 2019-10-13: qty 1

## 2019-10-13 MED ORDER — SODIUM CHLORIDE 0.9 % IV SOLN: INTRAVENOUS | Status: DC

## 2019-10-13 NOTE — ED Provider Notes (Signed)
[facility] Emergency Department Provider Note   ____________________________________________    I have reviewed the triage vital signs and the nursing notes.   HISTORY  Chief Complaint Leg Pain and Chest Pain     HPI Patrick Turner is a 74 y.o. male with a history as noted below who presents with complaints of pain in his right thigh.  He reports he had a cardiac catheterization and 2 drug-eluting stents placed approximately 1 week ago.  He reports this afternoon he developed pain in his right medial and right anterior thigh just above the knee, he describes it as sore.  He has had bruising since the catheterization in his leg but only the pain developed today.  He describes some intermittent chest discomfort since having the catheterization.  No shortness of breath.  No fevers or chills.  Has not take anything for this.  Past Medical History:  Diagnosis Date  . Altered mental status   . Anemia   . Anxiety   . Aortic atherosclerosis ([place])   . Arthritis   . Coronary artery disease   . CRD (chronic renal disease)   . Depression   . Diabetes mellitus without complication ([place])   . Encephalopathy acute   . GERD (gastroesophageal reflux disease)   . Headache   . Hypercholesteremia   . Hypertension   . Neuropathy   . Severe obesity ([place])   . Sleep apnea   . Sleep terror    per patient this year per patient     Patient Active Problem List   Diagnosis Date Noted  . Unstable angina ([place]) 10/02/2019  . Chest pain 10/01/2019  . Chronic mesenteric ischemia ([place]) 05/06/2019  . Abdominal pain 05/03/2019  . Acute on chronic renal failure ([place]) 03/23/2019  . AMS (altered mental status) 12/24/2018  . Dizziness 06/30/2018  . HLD (hyperlipidemia) 02/23/2018  . Aortic atherosclerosis ([place]) 11/02/2017  . Healthcare maintenance 12/26/2016  . DM type 2 with diabetic peripheral neuropathy ([place]) 11/09/2016  . Presence of right artificial knee joint  08/14/2016  . 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 Acute diarrhea 05/30/2016  . Encephalopathy acute 05/30/2016  . Anemia 05/30/2016  . Acute on chronic renal insufficiency 05/30/2016  . Diarrhea 05/30/2016  . Depression, major, in remission ([place]) 08/02/2014  . Severe obesity (BMI 35.0-39.9) with comorbidity ([place]) 07/12/2014  . GERD (gastroesophageal reflux disease) 03/08/2014  . Hyperlipidemia associated with type 2 diabetes mellitus ([place]) 03/08/2014  . 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 Diabetes mellitus with stage 3 chronic kidney disease ([place]) 01/21/2014  . HTN (hypertension), benign 01/21/2014  . OSA (obstructive sleep apnea) 01/21/2014  . Unspecified transient cerebral ischemia 05/29/2012    Past Surgical History:  Procedure Laterality Date  . APPENDECTOMY    . CIRCUMCISION    . COLONOSCOPY    . COLONOSCOPY WITH PROPOFOL N/A 07/10/2018   Procedure: COLONOSCOPY WITH PROPOFOL;  Surgeon: Lollie Sails, MD;  Location: [facility] ENDOSCOPY;  Service: Endoscopy;  Laterality: N/A;  . COLONOSCOPY WITH PROPOFOL N/A 04/23/2019   Procedure: COLONOSCOPY WITH PROPOFOL;  Surgeon: Lollie Sails, MD;  Location: [facility] ENDOSCOPY;  Service: Endoscopy;  Laterality: N/A;  . CORONARY STENT INTERVENTION N/A 10/03/2019   Procedure: CORONARY STENT INTERVENTION;  Surgeon: Yolonda Kida, MD;  Location: [place] CV LAB;  Service: Cardiovascular;  Laterality: N/A;  . ESOPHAGOGASTRODUODENOSCOPY    . ESOPHAGOGASTRODUODENOSCOPY (EGD) WITH PROPOFOL N/A 04/23/2019   Procedure:  ESOPHAGOGASTRODUODENOSCOPY (EGD) WITH PROPOFOL;  Surgeon: Lollie Sails, MD;  Location: [facility] ENDOSCOPY;  Service: Endoscopy;  Laterality: N/A;  . JOINT REPLACEMENT    . KNEE ARTHROPLASTY Right 06/27/2016   Procedure: COMPUTER ASSISTED TOTAL KNEE ARTHROPLASTY;  Surgeon: Dereck Leep, MD;  Location: [facility] ORS;  Service: Orthopedics;  Laterality: Right;   . KNEE ARTHROSCOPY Right   . LEFT HEART CATH AND CORONARY ANGIOGRAPHY N/A 10/03/2019   Procedure: Left Heart Cath and possible Coronary intervention;  Surgeon: Dionisio David, MD;  Location: [place] CV LAB;  Service: Cardiovascular;  Laterality: N/A;  . TONSILLECTOMY    . VISCERAL ANGIOGRAPHY N/A 05/23/2019   Procedure: VISCERAL ANGIOGRAPHY;  Surgeon: Algernon Huxley, MD;  Location: [place] CV LAB;  Service: Cardiovascular;  Laterality: N/A;    Prior to Admission medications   Medication Sig Start Date End Date Taking? Authorizing Provider  aspirin 81 MG tablet Take 1 tablet (81 mg total) by mouth daily. 02/23/18   Henreitta Leber, MD  atorvastatin (LIPITOR) 80 MG tablet Take 80 mg by mouth at bedtime. 12/14/15   [provider]  b complex vitamins capsule Take 1 capsule by mouth daily.    [provider]  clonazePAM (KLONOPIN) 1 MG tablet Take 0.5 mg by mouth at bedtime as needed for anxiety.  12/18/15   [provider]  clopidogrel (PLAVIX) 75 MG tablet Take 1 tablet (75 mg total) by mouth daily. 09/09/19   Kris Hartmann, NP  gabapentin (NEURONTIN) 400 MG capsule Take 400 mg by mouth daily.     [provider]  insulin detemir (LEVEMIR) 100 UNIT/ML injection Inject 15 Units into the skin daily. 03/04/19   [provider]  iron polysaccharides (NIFEREX) 150 MG capsule Take 150 mg by mouth daily.    [provider]  lisinopril-hydrochlorothiazide (PRINZIDE,ZESTORETIC) 20-12.5 MG per tablet Take 1 tablet by mouth daily.     [provider]  metFORMIN (GLUCOPHAGE-XR) 500 MG 24 hr tablet Take 1,000 mg by mouth 2 (two) times daily with a meal.    [provider]  metoprolol tartrate (LOPRESSOR) 25 MG tablet Take 0.5 tablets (12.5 mg total) by mouth 2 (two) times daily. 10/04/19   Para Skeans, MD  Omega-3 Fatty Acids (FISH OIL) 1000 MG CAPS Take 1,000 mg by mouth daily.     [provider]  OZEMPIC, 0.25 OR 0.5  MG/DOSE, 2 MG/1.5ML SOPN Inject 1 Dose into the skin once a week.  03/04/19   [provider]  pantoprazole (PROTONIX) 40 MG tablet Take 40 mg by mouth daily.    [provider]  PARoxetine (PAXIL) 40 MG tablet Take 40 mg by mouth daily.     [provider]  pioglitazone (ACTOS) 15 MG tablet Take 15 mg by mouth daily.     [provider]  QUEtiapine (SEROQUEL) 100 MG tablet Take 100 mg by mouth at bedtime. 12/22/15   [provider]  topiramate (TOPAMAX) 100 MG tablet Take 100 mg by mouth daily.     [provider]  vitamin B-12 (CYANOCOBALAMIN) 1000 MCG tablet Take 1,000 mcg by mouth daily.    [provider]     Allergies Penicillins and Tavist [clemastine]  Family History  Problem Relation Age of Onset  . Diabetes Mother   . Heart disease Mother   . Cancer Father   . COPD Father     Social History Social History   Tobacco Use  . Smoking  status: Never Smoker  . Smokeless tobacco: Never Used  Substance Use Topics  . Alcohol use: Yes    Comment: rare  . Drug use: No    Review of Systems  Constitutional: No fever/chills Eyes: No visual changes.  ENT: No sore throat. Cardiovascular: As above Respiratory: Denies shortness of breath. Gastrointestinal: No abdominal pain.  N Genitourinary: Negative for dysuria. Musculoskeletal: As above Skin: Bruising to the thigh Neurological: Negative for headaches or weakness   ____________________________________________   PHYSICAL EXAM:  VITAL SIGNS: ED Triage Vitals  Enc Vitals Group     BP 10/13/19 1544 136/79     Pulse Rate 10/13/19 1544 60     Resp 10/13/19 1544 16     Temp 10/13/19 1544 97.6 F (36.4 C)     Temp Source 10/13/19 1544 Oral     SpO2 10/13/19 1544 100 %     Weight 10/13/19 1542 85 kg (187 lb 6.3 oz)     Height 10/13/19 1542 1.651 m (5\' 5" )     Head Circumference --      Peak Flow --      Pain Score 10/13/19 1542 3     Pain Loc --      Pain  Edu? --      Excl. in [place]? --     Constitutional: Alert and oriented.   Mouth/Throat: Mucous membranes are moist.   Neck:  Painless ROM Cardiovascular: Normal rate, regular rhythm. Grossly normal heart sounds.  Good peripheral circulation. Respiratory: Normal respiratory effort.  No retractions. Lungs CTAB. Gastrointestinal: Soft and nontender. No distention.   Musculoskeletal: Patient with significant bruising to the dependent aspect of his right thigh, leg is warm and well perfused and compartments are soft.  No palpable thrill or abnormality yet catheterization site.  No swelling. Neurologic:  Normal speech and language. No gross focal neurologic deficits are appreciated.  Skin:  Skin is warm, dry  Psychiatric: Mood and affect are normal. Speech and behavior are normal.  ____________________________________________   LABS (all labs ordered are listed, but only abnormal results are displayed)  Labs Reviewed  BASIC METABOLIC PANEL - Abnormal; Notable for the following components:      Result Value   Potassium 5.3 (*)    CO2 19 (*)    Glucose, Bld 155 (*)    BUN 42 (*)    Creatinine, Ser 2.21 (*)    GFR calc non Af Amer 28 (*)    GFR calc Af Amer 33 (*)    All other components within normal limits  CBC - Abnormal; Notable for the following components:   Hemoglobin 11.4 (*)    HCT 36.9 (*)    RDW 21.5 (*)    All other components within normal limits  SARS CORONAVIRUS 2 (TAT 6-24 HRS)  TROPONIN I (HIGH SENSITIVITY)   ____________________________________________  EKG  ED ECG REPORT I, Lavonia Drafts, the attending physician, personally viewed and interpreted this ECG.  Date: 10/13/2019  Rhythm: normal sinus rhythm QRS Axis: normal Intervals: normal ST/T Wave abnormalities: normal Narrative Interpretation: no evidence of acute ischemia  ____________________________________________  RADIOLOGY  Chest x-ray markable Ultrasound  ____________________________________________   PROCEDURES  Procedure(s) performed: No  Procedures   Critical Care performed: No ____________________________________________   INITIAL IMPRESSION / ASSESSMENT AND PLAN / ED COURSE  Pertinent labs & imaging results that were available during my care of the patient were reviewed by me and considered in my medical decision making (see chart for details).  Patient  presents with complaints of right thigh discomfort started today 1 week after catheterization.  Bruising to the thigh appears old however given recent catheterization concern for possible pseudoaneurysm.  Will obtain ultrasound  Ultrasound consistent with spontaneously thrombosed pseudoaneurysm, discussed ultrasound results with Dr. Lorenso Courier who notes no emergent procedures necessary.  Patient with acute on chronic kidney injury with hyperkalemia, IV fluids infusing, will likely require admission for IV hydration    ____________________________________________   FINAL CLINICAL IMPRESSION(S) / ED DIAGNOSES  Final diagnoses:  Acute renal failure superimposed on chronic kidney disease, unspecified CKD stage, unspecified acute renal failure type (HCC)  Hyperkalemia  Pseudoaneurysm following procedure ([place])        Note:  This document was prepared using Dragon voice recognition software and may include unintentional dictation errors.   Lavonia Drafts, MD 10/13/19 [place] Lanius

## 2019-10-13 NOTE — ED Triage Notes (Addendum)
FIRST NURSE NOTE- here for leg pain. Pt had cath 1/21.  Bruising noted to right upper leg from cath and pt reports bruising is same and has not gotten worse.  Reports chest pain since procedure but reports today it is worse

## 2019-10-13 NOTE — Plan of Care (Signed)

## 2019-10-13 NOTE — H&P (Signed)
History and Physical    Patrick Turner [id number] DOB: 07/11/1946 DOA: 10/13/2019  Referring MD/NP/PA:   PCP: Kirk Ruths, MD   Patient coming from:  The patient is coming from home.  At baseline, pt is independent for most of ADL.        Chief Complaint: right leg pain for one day  HPI: Patrick Turner is a 74 y.o. 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 He underwent cardiac cath with 2 stent placed via right groin on 1/21. He states he had some bruise and  swelling of his right leg after the procedure. Today he had significant pain on right leg, sharp, 9/10, feeling below the knee numbness. His chest pain is improving, but still has pressure like persistent chest pain 2/10. He denies fever, chills, cough, SOB, nausea vomiting or diarrhea.   ED Course: VSS except BP elevated.  COVID-19 negative.  Labs significant for worsening renal function BUN/Cr 42/2.21, was 31/1.63 on 1/22. K5.3.  right leg venous Doppler no DVT, arterial doppler showed spontaneously thrombosed pseudoaneurysm. ED discussed ultrasound results with Dr. Lorenso Courier who notes no emergent procedures necessary. Pt is admit for AKI.   Review of Systems:   General: no fevers, chills, no body weight gain HEENT: no blurry vision, hearing changes or sore throat Respiratory: no dyspnea, coughing, wheezing CV: + chest pain, no palpitations GI: no nausea, vomiting, abdominal pain, diarrhea, constipation GU: no dysuria, burning on urination, increased urinary frequency, hematuria  Ext: right leg edema and bruise with pain Neuro: no unilateral weakness, numbness, or tingling, no vision change or hearing loss Skin: no ulcer, no skin tear. MSK: No muscle spasm, no deformity, no limitation of range of movement in spin Heme: No easy bruising.  Travel history: No recent long distant  travel.  Allergy:  Allergies  Allergen Reactions  . Penicillins Anaphylaxis    Has patient had a PCN reaction causing immediate rash, facial/tongue/throat swelling, SOB or lightheadedness with hypotension: Yes Has patient had a PCN reaction causing severe rash involving mucus membranes or skin necrosis: No Has patient had a PCN reaction that required hospitalization Yes Has patient had a PCN reaction occurring within the last 10 years: No If all of the above answers are "NO", then may proceed with Cephalosporin use.  . Tavist [Clemastine] Swelling    Past Medical History:  Diagnosis Date  . Altered mental status   . Anemia   . Anxiety   . Aortic atherosclerosis ([place])   . Arthritis   . Coronary artery disease   . CRD (chronic renal disease)   . Depression   . Diabetes mellitus without complication ([place])   . Encephalopathy acute   . GERD (gastroesophageal reflux disease)   . Headache   . Hypercholesteremia   . Hypertension   . Neuropathy   . Severe obesity ([place])   . Sleep apnea   . Sleep terror    per patient this year per patient     Past Surgical History:  Procedure Laterality Date  . APPENDECTOMY    . CIRCUMCISION    . COLONOSCOPY    . COLONOSCOPY WITH PROPOFOL N/A 07/10/2018   Procedure: COLONOSCOPY WITH PROPOFOL;  Surgeon: Lollie Sails, MD;  Location: [facility] ENDOSCOPY;  Service: Endoscopy;  Laterality: N/A;  . COLONOSCOPY WITH PROPOFOL N/A 04/23/2019   Procedure: COLONOSCOPY WITH PROPOFOL;  Surgeon: Lollie Sails, MD;  Location[facility] END[facility]COPY;  Service: Endoscopy;  Laterality: N/A;  . CORONARY STENT INTERVENTION N/A 10/03/2019   Procedure: CORONARY STENT INTERVENTION;  Surgeon: Yolonda Kida, MD;  Location: [facility] CV LAB;  Service: Cardiovascular;  Laterality: N/A;  . ESOPHAGOGASTRODUODENOSCOPY    . ESOPHAGOGASTRODUODENOSCOPY (EGD) WITH PROPOFOL N/A 04/23/2019   Procedure: ESOPHAGOGASTRODUODENOSCOPY (EGD) WITH PROPOFOL;  Surgeon: Lollie Sails, MD;  Location: [facility] ENDOSCOPY;  Service: Endoscopy;  Laterality: N/A;  . JOINT REPLACEMENT    . 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 KNEE ARTHROSCOPY Right   . LEFT HEART CATH AND CORONARY ANGIOGRAPHY N/A 10/03/2019   Procedure: Left Heart Cath and possible Coronary intervention;  Surgeon: Dionisio David, MD;  Location: [place] CV LAB;  Service: Cardiovascular;  Laterality: N/A;  . TONSILLECTOMY    . VISCERAL ANGIOGRAPHY N/A 05/23/2019   Procedure: VISCERAL ANGIOGRAPHY;  Surgeon: Algernon Huxley, MD;  Location: [place] CV LAB;  Service: Cardiovascular;  Laterality: N/A;    Social History:  reports that he has never smoked. He has never used smokeless tobacco. He reports current alcohol use. He reports that he does not use drugs.  Family History:  Family History  Problem Relation Age of Onset  . Diabetes Mother   . Heart disease Mother   . Cancer Father   . COPD Father      Prior to Admission medications   Medication Sig Start Date End Date Taking? Authorizing Provider  aspirin 81 MG tablet Take 1 tablet (81 mg total) by mouth daily. 02/23/18  Yes Henreitta Leber, MD  atorvastatin (LIPITOR) 80 MG tablet Take 80 mg by mouth at bedtime. 12/14/15  Yes [provider]  b complex vitamins capsule Take 1 capsule by mouth daily.   Yes [provider]  clonazePAM (KLONOPIN) 1 MG tablet Take 0.5 mg by mouth at bedtime as needed for anxiety.  12/18/15  Yes [provider]  clopidogrel (PLAVIX) 75 MG tablet Take 1 tablet (75 mg total) by mouth daily. 09/09/19  Yes Kris Hartmann, NP  gabapentin (NEURONTIN) 400 MG capsule Take 800 mg by mouth at bedtime.    Yes [provider]  insulin detemir (LEVEMIR) 100 UNIT/ML injection Inject 15 Units into the skin daily. 03/04/19  Yes [provider]   iron polysaccharides (NIFEREX) 150 MG capsule Take 150 mg by mouth daily.   Yes [provider]  lisinopril-hydrochlorothiazide (PRINZIDE,ZESTORETIC) 20-12.5 MG per tablet Take 1 tablet by mouth daily.    Yes [provider]  metFORMIN (GLUCOPHAGE-XR) 500 MG 24 hr tablet Take 1,000 mg by mouth 2 (two) times daily with a meal.   Yes [provider]  metoprolol tartrate (LOPRESSOR) 25 MG tablet Take 0.5 tablets (12.5 mg total) by mouth 2 (two) times daily. Patient taking differently: Take 25 mg by mouth at bedtime.  10/04/19  Yes Para Skeans, MD  Omega-3 Fatty Acids (FISH OIL) 1000 MG CAPS Take 1,000 mg by mouth 2 (two) times daily.    Yes [provider]  OZEMPIC, 0.25 OR 0.5 MG/DOSE, 2 MG/1.5ML SOPN Inject 1 Dose into the skin once a week. Wednesdays 03/04/19  Yes [provider]  pantoprazole (PROTONIX) 40 MG tablet Take 40 mg by mouth daily.   Yes [provider]  PARoxetine (PAXIL) 40 MG tablet  Take 40 mg by mouth daily.    Yes [provider]  pioglitazone (ACTOS) 15 MG tablet Take 15 mg by mouth daily.    Yes [provider]  QUEtiapine (SEROQUEL) 100 MG tablet Take 100 mg by mouth at bedtime. 12/22/15  Yes [provider]  ranolazine (RANEXA) 500 MG 12 hr tablet Take 1 tablet by mouth 2 (two) times daily. 10/07/19  Yes [provider]  vitamin B-12 (CYANOCOBALAMIN) 1000 MCG tablet Take 1,000 mcg by mouth daily.   Yes [provider]  topiramate (TOPAMAX) 100 MG tablet Take 100 mg by mouth daily.     [provider]    Physical Exam: Vitals:   10/13/19 2130 10/13/19 2200 10/13/19 2245 10/14/19 0415  BP: (!) 132/52 (!) 137/52 (!) 157/67 (!) 146/57  Pulse: 66  68 70  Resp: 16  18 17   Temp:   98 F (36.7 C) 98.7 F (37.1 C)  TempSrc:   Oral Oral  SpO2: 100%  100% 98%  Weight:      Height:       General: Not in acute distress HEENT:       Eyes: PERRL, EOMI, no scleral  icterus.       ENT: No discharge from the ears and nose, no pharynx injection, no tonsillar enlargement.        Neck: No JVD, no bruit, no mass felt. Heme: No neck lymph node enlargement. Cardiac: S1/S2, RRR, No murmurs, No gallops or rubs. Respiratory: Good air movement bilaterally. No rales, wheezing, rhonchi or rubs. GI: Soft, nondistended, nontender, no rebound pain, no organomegaly, BS present. GU: No hematuria Ext: right upper leg ecchymosis at frontal medial side, slightly swelling  Musculoskeletal: No joint deformities, No joint redness or warmth, no limitation of ROM in spin. Skin: No rashes.  Neuro: Alert, oriented X3, cranial nerves II-XII grossly intact, moves all extremities normally.  Psych: Patient is not psychotic, no suicidal or hemocidal ideation.  Labs on Admission: I have personally reviewed following labs and imaging studies  CBC: Recent Labs  Lab 10/13/19 1553  WBC 7.5  HGB 11.4*  HCT 36.9*  MCV 87.0  PLT [id number]   Basic Metabolic Panel: Recent Labs  Lab 10/13/19 1553  NA 138  K 5.3*  CL 106  CO2 19*  GLUCOSE 155*  BUN 42*  CREATININE 2.21*  CALCIUM 9.1   GFR: Estimated Creatinine Clearance: 29.4 mL/min (A) (by C-G formula based on SCr of 2.21 mg/dL (H)). Liver Function Tests: No results for input(s): AST, ALT, ALKPHOS, BILITOT, PROT, ALBUMIN in the last 168 hours. No results for input(s): LIPASE, AMYLASE in the last 168 hours. No results for input(s): AMMONIA in the last 168 hours. Coagulation Profile: No results for input(s): INR, PROTIME in the last 168 hours. Cardiac Enzymes: No results for input(s): CKTOTAL, CKMB, CKMBINDEX, TROPONINI in the last 168 hours. BNP (last 3 results) No results for input(s): PROBNP in the last 8760 hours. HbA1C: No results for input(s): HGBA1C in the last 72 hours. CBG: Recent Labs  Lab 10/14/19 0109 10/14/19 0328  GLUCAP 123* 115*   Lipid Profile: No results for input(s): CHOL, HDL, LDLCALC, TRIG,  CHOLHDL, LDLDIRECT in the last 72 hours. Thyroid Function Tests: No results for input(s): TSH, T4TOTAL, FREET4, T3FREE, THYROIDAB in the last 72 hours. Anemia Panel: No results for input(s): VITAMINB12, FOLATE, FERRITIN, TIBC, IRON, RETICCTPCT in the last 72 hours. Urine analysis:    Component Value Date/Time   COLORURINE STRAW (A) 03/23/2019  [place] (A) 03/23/2019 1726   APPEARANCEUR Clear 11/23/2012 0629   LABSPEC 1.006 03/23/2019 1726   LABSPEC 1.018 11/23/2012 0629   PHURINE 5.0 03/23/2019 1726   GLUCOSEU NEGATIVE 03/23/2019 1726   GLUCOSEU Negative 11/23/2012 0629   HGBUR NEGATIVE 03/23/2019 1726   BILIRUBINUR NEGATIVE 03/23/2019 1726   BILIRUBINUR Negative 11/23/2012 0629   KETONESUR NEGATIVE 03/23/2019 1726   PROTEINUR NEGATIVE 03/23/2019 1726   NITRITE NEGATIVE 03/23/2019 1726   LEUKOCYTESUR NEGATIVE 03/23/2019 1726   LEUKOCYTESUR Negative 11/23/2012 0629   Sepsis Labs: @LABRCNTIP (procalcitonin:4,lacticidven:4) ) Recent Results (from the past 240 hour(s))  SARS CORONAVIRUS 2 (TAT 6-24 HRS) Nasopharyngeal Nasopharyngeal Swab     Status: None   Collection Time: 10/13/19  8:12 PM   Specimen: Nasopharyngeal Swab  Result Value Ref Range Status   SARS Coronavirus 2 NEGATIVE NEGATIVE Final    Comment: (NOTE) SARS-CoV-2 target nucleic acids are NOT DETECTED. The SARS-CoV-2 RNA is generally detectable in upper and lower respiratory specimens during the acute phase of infection. Negative results do not preclude SARS-CoV-2 infection, do not rule out co-infections with other pathogens, and should not be used as the sole basis for treatment or other patient management decisions. Negative results must be combined with clinical observations, patient history, and epidemiological information. The expected result is Negative. Fact Sheet for Patients: SugarRoll.be Fact Sheet for Healthcare  Providers: https://www.woods-mathews.com/ This test is not yet approved or cleared by the [place] FDA and  has been authorized for detection and/or diagnosis of SARS-CoV-2 by FDA under an Emergency Use Authorization (EUA). This EUA will remain  in effect (meaning this test can be used) for the duration of the COVID-19 declaration under Section 56 4(b)(1) of the Act, 21 U.S.C. section 360bbb-3(b)(1), unless the authorization is terminated or revoked sooner. Performed at [facility], [place] [address] [place], [place] [address]      Radiological Exams on Admission: DG Chest 2 View  Result Date: 10/13/2019 CLINICAL DATA:  Chest pain. EXAM: CHEST - 2 VIEW COMPARISON:  October 01, 2019 FINDINGS: The heart, hila, mediastinum, lungs, and pleura are normal. IMPRESSION: No active cardiopulmonary disease. Electronically Signed   By: Dorise Bullion III M.D   On: 10/13/2019 17:35   US Venous Img Lower Unilateral Right  Result Date: 10/13/2019 CLINICAL DATA:  Pain and swelling status post catheterization EXAM: RIGHT LOWER EXTREMITY VENOUS DOPPLER ULTRASOUND TECHNIQUE: Gray-scale sonography with graded compression, as well as color Doppler and duplex ultrasound were performed to evaluate the lower extremity deep venous systems from the level of the common femoral vein and including the common femoral, femoral, profunda femoral, popliteal and calf veins including the posterior tibial, peroneal and gastrocnemius veins when visible. The superficial great saphenous vein was also interrogated. Spectral Doppler was utilized to evaluate flow at rest and with distal augmentation maneuvers in the common femoral, femoral and popliteal veins. COMPARISON:  None. FINDINGS: Contralateral Common Femoral Vein: Respiratory phasicity is normal and symmetric with the symptomatic side. No evidence of thrombus. Normal compressibility. Common Femoral Vein: No evidence of thrombus. Normal compressibility,  respiratory phasicity and response to augmentation. Saphenofemoral Junction: No evidence of thrombus. Normal compressibility and flow on color Doppler imaging. Profunda Femoral Vein: No evidence of thrombus. Normal compressibility and flow on color Doppler imaging. Femoral Vein: No evidence of thrombus. Normal compressibility, respiratory phasicity and response to augmentation. Popliteal Vein: No evidence of thrombus. Normal compressibility, respiratory phasicity and response to augmentation. Calf Veins: No evidence of thrombus.  Normal compressibility and flow on color Doppler imaging. Superficial Great Saphenous Vein: No evidence of thrombus. Normal compressibility. Venous Reflux:  None. Other Findings:  None. IMPRESSION: 1. No evidence for DVT. 2. Slightly pulsatile waveforms bilaterally favored to be physiologic. Electronically Signed   By: Constance Holster M.D.   On: 10/13/2019 19:11   [place] Lower Ext Art Right Ltd  Result Date: 10/13/2019 CLINICAL DATA:  Concern for pseudoaneurysm post catheterization on 10/09/2019 EXAM: RIGHT LOWER EXTREMITY ARTERIAL DUPLEX SCAN TECHNIQUE: Gray-scale sonography as well as color Doppler and duplex ultrasound was performed to evaluate the lower extremity arteries including the common, superficial and profunda femoral arteries, popliteal artery and calf arteries. COMPARISON:  None. FINDINGS: The right common femoral artery demonstrates a normal arterial triphasic waveform. The venous waveforms are slightly pulsatile but similar bilaterally. There is a biphasic waveform involving the right profundus femoris artery. At the palpable area of concern in the right groin there is a 3.2 x 2.2 x 2 cm mass with no significant internal color Doppler flow. This mass is favored represent a hematoma. There may be a pseudoaneurysm neck arising from the right profundus femoris artery. The right superficial femoral artery distal to the access site demonstrates a normal triphasic to biphasic  waveform. There is a possible connection between the thrombosis pseudoaneurysm and the nearby femoral vein best visualized on the final cine clip image 93. This could represent remnants of an iatrogenic arteriovenous fistula, however no flow is clearly identified at this level. IMPRESSION: Findings most consistent with a spontaneously thrombosed pseudoaneurysm arising from a branch of the right profundus femoris artery. Slightly pulsatile venous waveforms which appear to be consistent bilaterally and therefore are felt to be physiologic, less likely secondary to an iatrogenic arteriovenous fistula. Electronically Signed   By: Constance Holster M.D.   On: 10/13/2019 19:09   Assessment/Plan Principal Problem:   AKI (acute kidney injury) ([place]) Active Problems:   Acute on chronic renal insufficiency   Diabetes mellitus with stage 3 chronic kidney disease (HCC)   HTN (hypertension), benign   HLD (hyperlipidemia)   DM type 2 with diabetic peripheral neuropathy (HCC)   Hyperkalemia   Pseudoaneurysm of right femoral artery (HCC)  Patrick Turner is a 74 y.o. 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   AKI on ckd2 BUN/Cr 42/2.21, was 31/1.63 on 1/22. Likely due to contrast with cardiac cath IV fluid  Hyperkalemia Due to AKI  K5.3 IVF  Pseudoaneurysm of right profundus femoris artery due to catheterization spontaneously thrombosed ED discussed ultrasound results with Dr. Lorenso Courier who notes no emergent procedures necessary. We will follow up with cardiology outpatient  HTN (hypertension), benign, CAD Continue with metoprolol, ranolazine, ASA, and statin,  hold lisinopril HCTZ due to AKI Monitor BP  DM type 2 with diabetic peripheral neuropathy  SSI Hold Metformin  DVT ppx: SQ Heparin         Code Status: Full code Family  Communication: None at bed side.              Disposition Plan:  Anticipate discharge back to previous home environment Consults called:   Admission status: Obs        Date of Service 10/13/2019   [facility]   If 7PM-7AM, please contact night-coverage [email or web address] Password [facility] 10/14/2019, 5:45 AM

## 2019-10-13 NOTE — ED Notes (Signed)
US at bedside at this time 

## 2019-10-13 NOTE — ED Triage Notes (Signed)
Pt to ED via POV for right leg pain and chest pain that got worse today. Pt had cath done on around 1/22. Pt states that he has had pain since that time. Pt is also stating that he has bruising in the area. Pt is in NAD.

## 2019-10-14 ENCOUNTER — Observation Stay: Payer: Medicare HMO

## 2019-10-14 ENCOUNTER — Encounter (INDEPENDENT_AMBULATORY_CARE_PROVIDER_SITE_OTHER): Payer: Medicare HMO

## 2019-10-14 ENCOUNTER — Ambulatory Visit (INDEPENDENT_AMBULATORY_CARE_PROVIDER_SITE_OTHER): Payer: Medicare HMO | Admitting: Nurse Practitioner

## 2019-10-14 DIAGNOSIS — I724 Aneurysm of artery of lower extremity: Secondary | ICD-10-CM | POA: Diagnosis present

## 2019-10-14 DIAGNOSIS — N179 Acute kidney failure, unspecified: Secondary | ICD-10-CM

## 2019-10-14 DIAGNOSIS — E875 Hyperkalemia: Secondary | ICD-10-CM | POA: Diagnosis present

## 2019-10-14 DIAGNOSIS — M19071 Primary osteoarthritis, right ankle and foot: Secondary | ICD-10-CM | POA: Diagnosis not present

## 2019-10-14 LAB — BASIC METABOLIC PANEL
Anion gap: 9 (ref 5–15)
BUN: 39 mg/dL — ABNORMAL HIGH (ref 8–23)
CO2: 19 mmol/L — ABNORMAL LOW (ref 22–32)
Calcium: 8.3 mg/dL — ABNORMAL LOW (ref 8.9–10.3)
Chloride: 110 mmol/L (ref 98–111)
Creatinine, Ser: 1.67 mg/dL — ABNORMAL HIGH (ref 0.61–1.24)
GFR calc Af Amer: 46 mL/min — ABNORMAL LOW (ref >60)
GFR calc non Af Amer: 40 mL/min — ABNORMAL LOW (ref >60)
Glucose, Bld: 145 mg/dL — ABNORMAL HIGH (ref 70–99)
Potassium: 4.3 mmol/L (ref 3.5–5.1)
Sodium: 138 mmol/L (ref 135–145)

## 2019-10-14 LAB — SARS CORONAVIRUS 2 (TAT 6-24 HRS): SARS Coronavirus 2: NEGATIVE

## 2019-10-14 LAB — GLUCOSE, CAPILLARY
Glucose-Capillary: 123 mg/dL — ABNORMAL HIGH (ref 70–99)
Glucose-Capillary: 115 mg/dL — ABNORMAL HIGH (ref 70–99)
Glucose-Capillary: 125 mg/dL — ABNORMAL HIGH (ref 70–99)
Glucose-Capillary: 163 mg/dL — ABNORMAL HIGH (ref 70–99)

## 2019-10-14 LAB — HEMOGLOBIN A1C
Hgb A1c MFr Bld: 6.8 % — ABNORMAL HIGH (ref 4.8–5.6)
Mean Plasma Glucose: 148.46 mg/dL

## 2019-10-14 LAB — MAGNESIUM: Magnesium: 2 mg/dL (ref 1.7–2.4)

## 2019-10-14 MED ORDER — INSULIN ASPART 100 UNIT/ML ~~LOC~~ SOLN
0.0000 [IU] | SUBCUTANEOUS | Status: DC
  Administered 2019-10-14: 13:00:00 3 [IU] via SUBCUTANEOUS
  Administered 2019-10-14: 09:00:00 2 [IU] via SUBCUTANEOUS
  Filled 2019-10-14 (×2): qty 1

## 2019-10-14 MED ORDER — ACETAMINOPHEN 325 MG PO TABS: 650.0000 mg | ORAL_TABLET | Freq: Four times a day (QID) | ORAL | 0 refills | Status: DC | PRN

## 2019-10-14 MED ORDER — CLONAZEPAM 0.5 MG PO TABS: 0.5000 mg | ORAL_TABLET | Freq: Every evening | ORAL | Status: DC | PRN

## 2019-10-14 MED ORDER — VITAMIN B-12 1000 MCG PO TABS
1000.0000 ug | ORAL_TABLET | Freq: Every day | ORAL | Status: DC
  Administered 2019-10-14: 09:00:00 1000 ug via ORAL
  Filled 2019-10-14: qty 1

## 2019-10-14 MED ORDER — INSULIN ASPART 100 UNIT/ML ~~LOC~~ SOLN: 0.0000 [IU] | Freq: Three times a day (TID) | SUBCUTANEOUS | 1 refills | Status: DC

## 2019-10-14 MED ORDER — RANOLAZINE ER 500 MG PO TB12
500.0000 mg | ORAL_TABLET | Freq: Two times a day (BID) | ORAL | Status: DC
  Administered 2019-10-14: 09:00:00 500 mg via ORAL
  Filled 2019-10-14 (×2): qty 1

## 2019-10-14 MED ORDER — GABAPENTIN 400 MG PO CAPS: 800.0000 mg | ORAL_CAPSULE | Freq: Every day | ORAL | Status: DC

## 2019-10-14 MED ORDER — OMEGA-3-ACID ETHYL ESTERS 1 G PO CAPS
1.0000 g | ORAL_CAPSULE | Freq: Every day | ORAL | Status: DC
  Administered 2019-10-14: 1 g via ORAL
  Filled 2019-10-14: qty 1

## 2019-10-14 MED ORDER — INSULIN DETEMIR 100 UNIT/ML ~~LOC~~ SOLN
15.0000 [IU] | Freq: Every day | SUBCUTANEOUS | Status: DC
  Administered 2019-10-14: 15 [IU] via SUBCUTANEOUS
  Filled 2019-10-14: qty 0.15

## 2019-10-14 MED ORDER — ATORVASTATIN CALCIUM 80 MG PO TABS: 80.0000 mg | ORAL_TABLET | Freq: Every day | ORAL | Status: DC

## 2019-10-14 MED ORDER — PAROXETINE HCL 20 MG PO TABS
40.0000 mg | ORAL_TABLET | Freq: Every day | ORAL | Status: DC
  Administered 2019-10-14: 40 mg via ORAL
  Filled 2019-10-14: qty 2

## 2019-10-14 MED ORDER — ASPIRIN EC 81 MG PO TBEC
81.0000 mg | DELAYED_RELEASE_TABLET | Freq: Every day | ORAL | Status: DC
  Administered 2019-10-14: 09:00:00 81 mg via ORAL
  Filled 2019-10-14: qty 1

## 2019-10-14 MED ORDER — QUETIAPINE FUMARATE 25 MG PO TABS: 100.0000 mg | ORAL_TABLET | Freq: Every day | ORAL | Status: DC

## 2019-10-14 MED ORDER — RENA-VITE PO TABS
1.0000 | ORAL_TABLET | Freq: Every day | ORAL | Status: DC
  Administered 2019-10-14: 09:00:00 1 via ORAL
  Filled 2019-10-14: qty 1

## 2019-10-14 MED ORDER — LISINOPRIL-HYDROCHLOROTHIAZIDE 20-12.5 MG PO TABS: 0.5000 | ORAL_TABLET | Freq: Every day | ORAL | Status: DC

## 2019-10-14 MED ORDER — CLOPIDOGREL BISULFATE 75 MG PO TABS
75.0000 mg | ORAL_TABLET | Freq: Every day | ORAL | Status: DC
  Administered 2019-10-14: 75 mg via ORAL
  Filled 2019-10-14: qty 1

## 2019-10-14 MED ORDER — PIOGLITAZONE HCL 15 MG PO TABS
15.0000 mg | ORAL_TABLET | Freq: Every day | ORAL | Status: DC
  Administered 2019-10-14: 09:00:00 15 mg via ORAL
  Filled 2019-10-14: qty 1

## 2019-10-14 MED ORDER — PANTOPRAZOLE SODIUM 40 MG PO TBEC
40.0000 mg | DELAYED_RELEASE_TABLET | Freq: Every day | ORAL | Status: DC
  Administered 2019-10-14: 40 mg via ORAL
  Filled 2019-10-14: qty 1

## 2019-10-14 MED ORDER — POLYSACCHARIDE IRON COMPLEX 150 MG PO CAPS
150.0000 mg | ORAL_CAPSULE | Freq: Every day | ORAL | Status: DC
  Administered 2019-10-14: 150 mg via ORAL
  Filled 2019-10-14: qty 1

## 2019-10-14 MED ORDER — METOPROLOL TARTRATE 25 MG PO TABS: 25.0000 mg | ORAL_TABLET | Freq: Every day | ORAL | Status: DC

## 2019-10-14 NOTE — Discharge Summary (Signed)
Physician Discharge Summary  Patient ID: Patrick Turner MRN: [id number] DOB/AGE: 1946-07-23 74 y.o.  Admit date: 10/13/2019 Discharge date: 10/14/2019  Admission Diagnoses: Right leg pain and AKI  Discharge Diagnoses:  Principal Problem:   AKI (acute kidney injury) ([place]) Active Problems:   Acute on chronic renal insufficiency   Diabetes mellitus with stage 3 chronic kidney disease (HCC)   HTN (hypertension), benign   HLD (hyperlipidemia)   DM type 2 with diabetic peripheral neuropathy (HCC)   Hyperkalemia   Pseudoaneurysm of right femoral artery [facility])   Discharged Condition: stable  Hospital Course:  HPI: Patrick Turner is a 74 y.o. 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 He underwent cardiac cath with 2 stent placed via right groin on 1/21. He states he had some bruise and  swelling of his right leg after the procedure. Today he had significant pain on right leg, sharp, 9/10, feeling below the knee numbness. right leg venous Doppler no DVT, arterial doppler showed spontaneously thrombosed pseudoaneurysm. ED discussed ultrasound results with Dr. Lorenso Courier who notes no emergent procedures necessary. Pt is admit for AKI.   AKI on ckd2: Significantly improved on discharge; discharge creatinine was 1.67 BUN/Cr 42/2.21 admission, was 31/1.63 on 1/22. Likely due to contrast with cardiac cath S/P IV fluid; encouraged to continue p.o. hydration as tolerated -Also strongly encouraged to follow-up with PCP in a week with follow-up labs possibly as per PCP  Pseudoaneurysm of right profundus femoris artery due to catheterization spontaneously thrombosed ED discussed ultrasound results with Dr. Lorenso Courier, color surgeon, who notes no emergent procedures necessary.  Encouraged cardiology outpatient. -Patient has a cardiology appointment on this Thursday with Dr.  Humphrey Rolls; patient will follow up  Right leg pain: Patient stated he had a fall prior to coming to the emergency this time with leg twist.  And he was experiencing on his right foot. -Complete x-ray of his right foot shows no acute findings or fracture.  But some osteoarthritis. -Advised patients with as needed Tylenol -Follow-up PCP  HTN (hypertension), benign, CAD-acute issue.  Resume home medication  DM type 2 with diabetic peripheral neuropathy  SSI Held Metformin; patient was recommended to continue to hold Metformin since he is in AKI on CKD.  Also prescribed sliding scale insulin meanwhile. -Explained to patient in details.  Also advised to check his blood glucose and follow the sliding scale insulin along with his other home regimens. -Advised to follow-up with PCP in a week with blood glucose log for further adjustment if needed  Consults: Vascular surgery  Significant Diagnostic Studies: Radiologic imaging and blood work  Treatments: Hospital course and discharge med list  Discharge Exam: Blood pressure (!) 123/48, pulse 72, temperature 98.4 F (36.9 C), temperature source Oral, resp. rate 18, height 5\' 5"  (1.651 m), weight 85 kg, SpO2 97 %.  General: Not in acute distress HEENT:       Eyes: PERRL, EOMI, no scleral icterus.       ENT: No discharge from the ears and nose, no pharynx injection, no tonsillar enlargement.        Neck: No JVD, no bruit, no mass felt. Heme: No neck lymph node enlargement. Cardiac: S1/S2, RRR, No murmurs, No gallops or rubs. Respiratory: Good air movement bilaterally. No rales, wheezing, rhonchi or rubs. GI: Soft, nondistended, nontender, no rebound pain, no organomegaly, BS present. GU:  No hematuria Ext: right upper leg ecchymosis at frontal medial side, slightly swelling  Musculoskeletal: No joint deformities, No joint redness or warmth, no limitation of ROM in spin. Skin: No rashes.  Neuro: Alert, oriented X3, cranial nerves II-XII grossly  intact, moves all extremities normally.  Psych: Patient is not psychotic, no suicidal or hemocidal ideation.  Disposition: Discharge disposition: 01-Home or Self Care     Will be discharged home with close follow-up with PCP and cardiology.  Warning signs and symptoms explained to patient when he must seek immediate medical attention.  Patient expressed understanding Discharge Instructions    Bed rest   Complete by: As directed    Call MD for:  difficulty breathing, headache or visual disturbances   Complete by: As directed    Call MD for chest pain, shortness of breath, palpitation, dizziness or focal neurological deficit   Call MD for:  extreme fatigue   Complete by: As directed    Call MD for:  severe uncontrolled pain   Complete by: As directed    Diet - low sodium heart healthy   Complete by: As directed    Increase activity slowly   Complete by: As directed      Allergies as of 10/14/2019      Reactions   Penicillins Anaphylaxis   Has patient had a PCN reaction causing immediate rash, facial/tongue/throat swelling, SOB or lightheadedness with hypotension: Yes Has patient had a PCN reaction causing severe rash involving mucus membranes or skin necrosis: No Has patient had a PCN reaction that required hospitalization Yes Has patient had a PCN reaction occurring within the last 10 years: No If all of the above answers are "NO", then may proceed with Cephalosporin use.   Tavist [clemastine] Swelling      Medication List    STOP taking these medications   metFORMIN 500 MG 24 hr tablet Commonly known as: GLUCOPHAGE-XR   QUEtiapine 100 MG tablet Commonly known as: SEROQUEL     TAKE these medications   acetaminophen 325 MG tablet Commonly known as: TYLENOL Take 2 tablets (650 mg total) by mouth every 6 (six) hours as needed for mild pain (or Fever >/= 101).   aspirin 81 MG tablet Take 1 tablet (81 mg total) by mouth daily. Notes to patient: Tomorrow at 9A.     atorvastatin 80 MG tablet Commonly known as: LIPITOR Take 80 mg by mouth at bedtime. Notes to patient: Tonight at bedtime.    b complex vitamins capsule Take 1 capsule by mouth daily. Notes to patient: Today when home.    clonazePAM 1 MG tablet Commonly known as: KLONOPIN Take 0.5 mg by mouth at bedtime as needed for anxiety.   clopidogrel 75 MG tablet Commonly known as: Plavix Take 1 tablet (75 mg total) by mouth daily. Notes to patient: Tomorrow at 9A.    Fish Oil 1000 MG Caps Take 1,000 mg by mouth 2 (two) times daily. Notes to patient: Today when home.    gabapentin 400 MG capsule Commonly known as: NEURONTIN Take 800 mg by mouth at bedtime. Notes to patient: Tonight at bedtime.    insulin aspart 100 UNIT/ML injection Commonly known as: novoLOG Inject 0-15 Units into the skin 3 (three) times daily with meals. Notes to patient: Today with next meal.    iron polysaccharides 150 MG capsule Commonly known as: NIFEREX Take 150 mg by mouth daily. Notes to patient: Tomorrow at 9A.    Levemir 100 UNIT/ML injection Generic drug: insulin  detemir Inject 15 Units into the skin daily. Notes to patient: Tomorrow at 9A.    lisinopril-hydrochlorothiazide 20-12.5 MG tablet Commonly known as: ZESTORETIC Take 0.5 tablets by mouth daily. Hold if SBP<115 or DBP<55 What changed:   how much to take  additional instructions Notes to patient: Today when home.    metoprolol tartrate 25 MG tablet Commonly known as: LOPRESSOR Take 0.5 tablets (12.5 mg total) by mouth 2 (two) times daily. What changed:   how much to take  when to take this Notes to patient: Today when home.    Ozempic (0.25 or 0.5 MG/DOSE) 2 MG/1.5ML Sopn Generic drug: Semaglutide(0.25 or 0.5MG /DOS) Inject 1 Dose into the skin once a week. Wednesdays Notes to patient: Wednesday at 9A.    pantoprazole 40 MG tablet Commonly known as: PROTONIX Take 40 mg by mouth daily. Notes to patient: Tomorrow at 9A.    PARoxetine 40 MG tablet Commonly known as: PAXIL Take 40 mg by mouth daily. Notes to patient: Tomorrow at 9A.    pioglitazone 15 MG tablet Commonly known as: ACTOS Take 15 mg by mouth daily. Notes to patient: Tomorrow at 9A.    ranolazine 500 MG 12 hr tablet Commonly known as: RANEXA Take 1 tablet by mouth 2 (two) times daily. Notes to patient: Tonight at 9P.    topiramate 100 MG tablet Commonly known as: TOPAMAX Take 100 mg by mouth daily. Notes to patient: Today when home.   vitamin B-12 1000 MCG tablet Commonly known as: CYANOCOBALAMIN Take 1,000 mcg by mouth daily. Notes to patient: Tomorrow at 9A.       Follow-up Information    Kirk Ruths, MD. Schedule an appointment as soon as possible for a visit in 2 week(s).   Specialty: Internal Medicine Why: f/u  Contact information: [place] [address] [phone]        Dionisio David, MD. Daphane Shepherd on 10/18/2019.   Specialty: Cardiology Why: Patient was supposed to be seeing Dr. Humphrey Rolls on 10/17/2019...Marland KitchenMarland Kitchenappointment at [facility] information: [address] [place] [address] [phone]           Signed: Thornell Mule 10/14/2019, 3:22 PM

## 2019-10-14 NOTE — Discharge Instructions (Signed)
Acute Kidney Injury, Adult  Acute kidney injury is a sudden worsening of kidney function. The kidneys are organs that have several jobs. They filter the blood to remove waste products and extra fluid. They also maintain a healthy balance of minerals and hormones in the body, which helps control blood pressure and keep bones strong. With this condition, your kidneys do not do their jobs as well as they should. This condition ranges from mild to severe. Over time it may develop into long-lasting (chronic) kidney disease. Early detection and treatment may prevent acute kidney injury from developing into a chronic condition. What are the causes? Common causes of this condition include:  A problem with blood flow to the kidneys. This may be caused by: ? Low blood pressure (hypotension) or shock. ? Blood loss. ? Heart and blood vessel (cardiovascular) disease. ? Severe burns. ? Liver disease.  Direct damage to the kidneys. This may be caused by: ? Certain medicines. ? A kidney infection. ? Poisoning. ? Being around or in contact with toxic substances. ? A surgical wound. ? A hard, direct hit to the kidney area.  A sudden blockage of urine flow. This may be caused by: ? Cancer. ? Kidney stones. ? An enlarged prostate in males. What are the signs or symptoms? Symptoms of this condition may not be obvious until the condition becomes severe. Symptoms of this condition can include:  Tiredness (lethargy), or difficulty staying awake.  Nausea or vomiting.  Swelling (edema) of the face, legs, ankles, or feet.  Problems with urination, such as: ? Abdominal pain, or pain along the side of your stomach (flank). ? Decreased urine production. ? Decrease in the force of urine flow.  Muscle twitches and cramps, especially in the legs.  Confusion or trouble concentrating.  Loss of appetite.  Fever. How is this diagnosed? This condition may be diagnosed with tests, including:  Blood  tests.  Urine tests.  Imaging tests.  A test in which a sample of tissue is removed from the kidneys to be examined under a microscope (kidney biopsy). How is this treated? Treatment for this condition depends on the cause and how severe the condition is. In mild cases, treatment may not be needed. The kidneys may heal on their own. In more severe cases, treatment will involve:  Treating the cause of the kidney injury. This may involve changing any medicines you are taking or adjusting your dosage.  Fluids. You may need specialized IV fluids to balance your body's needs.  Having a catheter placed to drain urine and prevent blockages.  Preventing problems from occurring. This may mean avoiding certain medicines or procedures that can cause further injury to the kidneys. In some cases treatment may also require:  A procedure to remove toxic wastes from the body (dialysis or continuous renal replacement therapy - CRRT).  Surgery. This may be done to repair a torn kidney, or to remove the blockage from the urinary system. Follow these instructions at home: Medicines  Take over-the-counter and prescription medicines only as told by your health care provider.  Do not take any new medicines without your health care provider's approval. Many medicines can worsen your kidney damage.  Do not take any vitamin and mineral supplements without your health care provider's approval. Many nutritional supplements can worsen your kidney damage. Lifestyle  If your health care provider prescribed changes to your diet, follow them. You may need to decrease the amount of protein you eat.  Achieve and maintain a healthy  weight. If you need help with this, ask your health care provider.  Start or continue an exercise plan. Try to exercise at least 30 minutes a day, 5 days a week.  Do not use any tobacco products, such as cigarettes, chewing tobacco, and e-cigarettes. If you need help quitting, ask your  health care provider. General instructions  Keep track of your blood pressure. Report changes in your blood pressure as told by your health care provider.  Stay up to date with immunizations. Ask your health care provider which immunizations you need.  Keep all follow-up visits as told by your health care provider. This is important. Where to find more information  American Association of Kidney Patients: BombTimer.gl  National Kidney Foundation: www.kidney.[place]: https://mathis.com/  Life Options Rehabilitation Program: ? www.lifeoptions.org ? www.kidneyschool.org Contact a health care provider if:  Your symptoms get worse.  You develop new symptoms. Get help right away if:  You develop symptoms of worsening kidney disease, which include: ? Headaches. ? Abnormally dark or light skin. ? Easy bruising. ? Frequent hiccups. ? Chest pain. ? Shortness of breath. ? End of menstruation in women. ? Seizures. ? Confusion or altered mental status. ? Abdominal or back pain. ? Itchiness.  You have a fever.  Your body is producing less urine.  You have pain or bleeding when you urinate. Summary  Acute kidney injury is a sudden worsening of kidney function.  Acute kidney injury can be caused by problems with blood flow to the kidneys, direct damage to the kidneys, and sudden blockage of urine flow.  Symptoms of this condition may not be obvious until it becomes severe. Symptoms may include edema, lethargy, confusion, nausea or vomiting, and problems passing urine.  This condition can usually be diagnosed with blood tests, urine tests, and imaging tests. Sometimes a kidney biopsy is done to diagnose this condition.  Treatment for this condition often involves treating the underlying cause. It is treated with fluids, medicines, dialysis, diet changes, or surgery. This information is not intended to replace advice given to you by your health care provider. Make  sure you discuss any questions you have with your health care provider. Document Revised: 08/11/2017 Document Reviewed: 08/19/2016 Elsevier Patient Education  [place].   Contrast-Induced Nephropathy Contrast-induced nephropathy (CIN) is a rare type of kidney damage caused by the dye that may be used in certain imaging tests. This dye is called a contrast medium. For these tests, the dye may be injected into a person's body to make tissue or blood vessels show up more clearly. A contrast medium may be used in imaging tests such as:  CT scans.  MRIs.  Angiograms. With CIN, the contrast medium may damage kidney cells directly. It may also cause a reaction in the kidneys that decreases blood supply and oxygen to the kidneys. Lack of blood and oxygen causes temporary kidney damage that usually gets better in about 2 weeks. Very rarely, severe damage may require the kidneys to be filtered by a machine (dialysis). What are the causes? The exact cause of CIN is not known. In some cases, there may be more than one cause. What increases the risk? The following factors may make you more likely to develop this condition:  Having kidney disease before you are exposed to contrast media. This is the main risk factor.  Having other conditions, such as: ? Dehydration. ? Diabetes. ? High blood pressure. ? Low blood pressure. ? Heart disease. ? Anemia. ?  A type of blood cancer that affects the kidneys (multiple myeloma).  Older age.  Needing an emergency exam that requires the use of contrast media.  Receiving a high dose of contrast media or receiving contrast media within 72 hours of the previous time you got it.  Having contrast media injected into a blood vessel that carries blood away from the heart (artery).  Being on a medicine that affects kidney function. These include NSAIDs, some antibiotics, and some cancer drugs. What are the signs or symptoms? Symptoms of this condition  often develop 2-3 days after getting contrast media. Symptoms may include:  Feeling tired (fatigue).  Appetite loss.  Swelling of the feet or ankles.  Puffy, swollen eyes.  Dry, itchy skin. In some cases, there are no symptoms. How is this diagnosed? This condition may be diagnosed based on:  Your symptoms and medical history. Your health care provider may suspect CIN if you recently had an imaging test or procedure that included the use of contrast media.  A physical exam.  Blood and urine tests to check for kidney damage. How is this treated? There is no specific treatment for CIN. If you develop CIN, you may be given fluids through an IV that is inserted into one of your veins. This will help keep your kidneys active. Electrolytes may be added to the fluid if you have an electrolyte imbalance. In most cases, CIN will get better in about 2 weeks. In rare cases, you may need kidney dialysis if kidney failure occurs and urine flow decreases. Even if you need dialysis, the condition usually improves over time. Follow these instructions at home:   Take over-the-counter and prescription medicines only as told by your health care provider. Over-the-counter NSAIDs, such as aspirin or ibuprofen, can increase your risk of CIN.  Return to your normal activities as told by your health care provider. Ask your health care provider what activities are safe for you.  Drink enough fluid to keep your urine pale yellow.  Always let a health care provider know that you have a history of CIN before having any test or procedure that may include contrast material.  Keep all follow-up visits as told by your health care provider. This is important. How is this prevented? Take the following steps to help reduce your risk for developing CIN:  Tell your health care provider about any risk factors you have for CIN.  Ask if a contrast medium is necessary for imaging tests or procedures you are scheduled  to have. Ask if there is an alternative to the test.  Make sure you drink plenty of fluids prior to any imaging tests or procedures that require contrast medium.  If possible, do not undergo multiple imaging tests or procedures that require contrast medium unless enough time has passed between tests. Contact a health care provider if:  You have any signs or symptoms of CIN.  You are urinating less than usual. Summary  Contrast-induced nephropathy (CIN) is a rare type of kidney damage caused by contrast medium.  The exact cause of CIN is not known. In some cases, there may be more than one cause.  Sometimes CIN does not cause any symptoms. If you do have symptoms, they may start 2-3 days after getting contrast medium.  To confirm the diagnosis, you may have blood tests and urine tests to check for kidney damage.  There is no specific treatment for CIN. In most cases, this condition will usually get better in about  2 weeks. You may be given IV fluids with electrolytes. In rare cases, dialysis is needed. This information is not intended to replace advice given to you by your health care provider. Make sure you discuss any questions you have with your health care provider. Document Revised: 12/11/2017 Document Reviewed: 12/11/2017 Elsevier Patient Education  [place].

## 2019-10-16 ENCOUNTER — Other Ambulatory Visit: Payer: Self-pay | Admitting: *Deleted

## 2019-10-16 ENCOUNTER — Encounter: Payer: Self-pay | Admitting: *Deleted

## 2019-10-16 DIAGNOSIS — E1159 Type 2 diabetes mellitus with other circulatory complications: Secondary | ICD-10-CM | POA: Diagnosis not present

## 2019-10-16 DIAGNOSIS — Z9289 Personal history of other medical treatment: Secondary | ICD-10-CM | POA: Diagnosis not present

## 2019-10-16 DIAGNOSIS — D631 Anemia in chronic kidney disease: Secondary | ICD-10-CM | POA: Diagnosis not present

## 2019-10-16 DIAGNOSIS — Z794 Long term (current) use of insulin: Secondary | ICD-10-CM | POA: Diagnosis not present

## 2019-10-16 DIAGNOSIS — E113393 Type 2 diabetes mellitus with moderate nonproliferative diabetic retinopathy without macular edema, bilateral: Secondary | ICD-10-CM | POA: Diagnosis not present

## 2019-10-16 DIAGNOSIS — N1831 Chronic kidney disease, stage 3a: Secondary | ICD-10-CM | POA: Diagnosis not present

## 2019-10-16 DIAGNOSIS — I2510 Atherosclerotic heart disease of native coronary artery without angina pectoris: Secondary | ICD-10-CM | POA: Diagnosis not present

## 2019-10-16 DIAGNOSIS — E1122 Type 2 diabetes mellitus with diabetic chronic kidney disease: Secondary | ICD-10-CM | POA: Diagnosis not present

## 2019-10-16 DIAGNOSIS — E1142 Type 2 diabetes mellitus with diabetic polyneuropathy: Secondary | ICD-10-CM | POA: Diagnosis not present

## 2019-10-16 NOTE — Patient Outreach (Signed)
[facility]) Care Management  [place]  10/16/2019   Patrick Turner May 26, 1946 [id number]   Telephone [facility] Admissions 1/19-1/23/21 for Chest Pain, PCI stents to Mid and distal LAD, anemia.  1/31-10/14/19 for Acute Renal Failure, pseudoaneurysm  right femoral artery.   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 Chronic mesenteric ischemia.    Subjective:  Patient reports feeling okay on today, he discussed recent visit to ED, due to pain at right leg site, concern for bruising and noted bleeding at site and acute kidney injury .  He denies chest pain or shortness of breath, he discussed improvement in bruising and decrease in pain. He states that he slipped to the ground when his son was trying to get him from the car at emergency room, reports that he had some pain at right ankle but that has improved no swelling.  He denies having dizziness. Reports tolerating drinking liquids well and diet . He voiced concern regarding recent medication changes at discharge and discussed at visit with Dr. Gabriel Carina on today.   Left side pain Patient reports rescheduled follow with Dr. Lucky Cowboy and imaging to follow up on SMA stent.   Diabetes Report follow up visit with Dr. Gabriel Carina today, he reports continuing metformin and discontinuing novolog insulin prescribed a recent discharge. He reports continuing to monitor blood sugars twice daily, unable to recall recent reading at this time denies low blood sugar readings.    Encounter Medications:  Outpatient Encounter Medications as of 10/16/2019  Medication Sig  . acetaminophen (TYLENOL) 325 MG tablet Take 2 tablets (650 mg total) by mouth every 6 (six) hours as needed for mild pain (or Fever >/= 101).  Marland Kitchen aspirin 81 MG tablet Take 1 tablet (81 mg total) by mouth daily.  Marland Kitchen atorvastatin (LIPITOR) 80 MG  tablet Take 80 mg by mouth at bedtime.  . clonazePAM (KLONOPIN) 1 MG tablet Take 0.5 mg by mouth at bedtime as needed for anxiety.   . clopidogrel (PLAVIX) 75 MG tablet Take 1 tablet (75 mg total) by mouth daily.  Marland Kitchen gabapentin (NEURONTIN) 400 MG capsule Take 800 mg by mouth at bedtime.   . insulin detemir (LEVEMIR) 100 UNIT/ML injection Inject 15 Units into the skin daily.  . iron polysaccharides (NIFEREX) 150 MG capsule Take 150 mg by mouth daily.  Marland Kitchen lisinopril-hydrochlorothiazide (ZESTORETIC) 20-12.5 MG tablet Take 0.5 tablets by mouth daily. Hold if SBP<115 or DBP<55  . metoprolol tartrate (LOPRESSOR) 25 MG tablet Take 0.5 tablets (12.5 mg total) by mouth 2 (two) times daily. (Patient taking differently: Take 25 mg by mouth at bedtime. )  . Omega-3 Fatty Acids (FISH OIL) 1000 MG CAPS Take 1,000 mg by mouth 2 (two) times daily.   Marland Kitchen OZEMPIC, 0.25 OR 0.5 MG/DOSE, 2 MG/1.5ML SOPN Inject 1 Dose into the skin once a week. Wednesdays  . pantoprazole (PROTONIX) 40 MG tablet Take 40 mg by mouth daily.  Marland Kitchen PARoxetine (PAXIL) 40 MG tablet Take 40 mg by mouth daily.   . pioglitazone (ACTOS) 15 MG tablet Take 15 mg by mouth daily.   . ranolazine (RANEXA) 500 MG 12 hr tablet Take 1 tablet by mouth 2 (two) times daily.  . vitamin B-12 (CYANOCOBALAMIN) 1000 MCG tablet Take 1,000 mcg by mouth daily.  Marland Kitchen b complex vitamins capsule Take 1 capsule by mouth daily.  . insulin aspart (NOVOLOG) 100 UNIT/ML  injection Inject 0-15 Units into the skin 3 (three) times daily with meals. (Patient not taking: Reported on 10/16/2019)  . topiramate (TOPAMAX) 100 MG tablet Take 100 mg by mouth daily.    No facility-administered encounter medications on file as of 10/16/2019.    Functional Status:  In your present state of health, do you have any difficulty performing the following activities: 10/16/2019 10/13/2019  Hearing? Y N  Comment wears hearing aide -  Vision? N N  Difficulty concentrating or making decisions? N N  Walking  or climbing stairs? Y N  Comment fall risk, uses cane/walker -  Dressing or bathing? N N  Doing errands, shopping? N N  Preparing Food and eating ? N -  Using the Toilet? N -  In the past six months, have you accidently leaked urine? N -  Do you have problems with loss of bowel control? N -  Managing your Medications? N -  Managing your Finances? N -  Housekeeping or managing your Housekeeping? Y -  Comment wife assist -  Some recent data might be hidden    Fall/Depression Screening: Fall Risk  10/11/2019  Falls in the past year? 1  Number falls in past yr: 1  Injury with Fall? 0  Risk for fall due to : History of fall(s);Impaired balance/gait  Follow up Falls prevention discussed   PHQ 2/9 Scores 10/11/2019  PHQ - 2 Score 0    Assessment:   Recent PCI/Stents Had post discharge visit with Dr. Chancy Milroy, he reports turning  in heart monitoring system on today and has follow up visit on 2/4.  He denies chest pain at this visit.  Cardiac Rehab referral in place. Encouraged patient regarding monitoring blood pressure at home, as some medications may affect blood pressure. He reports that he has checked it yesterday unable to recall reading.  High Fall Risk  Has cane for use when upstairs in home, and walker for when down stairs. Continued education and support on fall  prevention. Reports 3 recent falls in the last week.  History of depression  Reports consistent follow up with Psychiatrist Dr. Thurmond Butts, he plans to discuss Seroquel that was discontinued at recent admission at visit on today.    Medications  Patient was recently discharged from hospital and all medications have been reviewed.Patient denies cost concerns .  Patient reports that he usually manages his own  medications without problems. When reviewing medication with patient recent discharge instructions for taking 0.5 tablets of Zestoretic( 0.5 tablet daily)  and Metoprolol 0.5 tablet (12.5 mg total ) twice daily , patient  reports taking whole tablets stating not understanding what 0.5 tablet meant. Clarified discharge instructions patient report having a pill splitter at home, and will make changes, he was able to read back  instructions on medication  bottle,  he asked question regarding medication and if he encounters problem with pill being too small to spilt, he was also unclear what medications were used for explained basic use in current cardiac conditions and recent medications changes related to concern of kidney function  .He is agreeable to [place] regarding medications . Reinforced patient to take medications to visit with Dr. Chancy Milroy on tomorrow.    Appointments Dr. Chancy Milroy, Cardiologist , on 10/17/19 Dr. Lucky Cowboy, Vascular - 2/11 Dr. Thurmond Butts, Psychiatry on 2/3 Dr. Alice Reichert, GI on 10/14/19  Plan:  Will send MD initial telephonic visit note, to include medication concerns Will PCP [facility] barrier/involvment letter  Will place [facility] pharmacy referral regarding  patient question on medications and medication review.  Will plan return call to patient in the next week for follow up.   THN CM Care Plan Problem One     Most Recent Value  Care Plan Problem One  Patient with risk for readmission related to recent hospital admission for chest pain/stent placement   Role Documenting the Problem One  Care Management [place] for Problem One  Active  [facility] Long Term Goal   Patient will not experience a hospital admission over the next 31 days   THN Long Term Goal Start Date  10/16/19 [facility] reset recent , ED/obs admission]  Interventions for Problem One Long Term Goal  Reviewed current clinical state, reviewed discharge instructions,including medication review, wil place pharmacy referral due to concerns with medication management as patient agreeable.   THN CM Short Term Goal #1   Over the next 20 days patient will report attending all medical appointments  [facility] CM Short Term Goal #1 Start Date  10/16/19 [facility]  restart ]  Interventions for Short Term Goal #1  Reviewed upcoming appointments and transporation , encouraged patient to take all medications to visit . Reinforced follow up with PCP as recommended post recent discharge.   THN CM Short Term Goal #2   Patient will be able to reports monitoring blood pressure dailly over the next 30 days and keeping record.   THN CM Short Term Goal #2 Start Date  10/16/19  Interventions for Short Term Goal #2  Verifed patient has monitor and understanding of how to use, reinforced keeping a record and taking to MD visit. reviewed MD recommendations of blood pressure reading prior to taking certain medications.     THN CM Care Plan Problem Two     Most Recent Value  Care Plan Problem Two  Patient high risk for falls as evidenced by report of falls occurences   Role Documenting the Problem Two  Care Management [place] for Problem Two  Active  Interventions for Problem Two Long Term Goal   Discussed importance of notifying of increase in falls, falls with injury, complaints of dizziness.   THN Long Term Goal  Over the next 60 days patient will be able to identify measures to prevent falls   THN Long Term Goal Start Date  10/16/19  [facility] CM Short Term Goal #1   Over the next 30 days patient will be able to reports decrease in fall occurences as evidenced by report .   THN CM Short Term Goal #1 Start Date  10/16/19  Interventions for Short Term Goal #2   Urged patient to use walker cane for safety, changing positions slowly, standing a few seconds when arising from chair/bed before starting to walk.       Joylene Draft, RN, BSN  [place] Management Coordinator  [phone]- Mobile [phone]- Toll Free Main Office

## 2019-10-17 ENCOUNTER — Telehealth: Payer: Self-pay | Admitting: Pharmacist

## 2019-10-17 DIAGNOSIS — E782 Mixed hyperlipidemia: Secondary | ICD-10-CM | POA: Diagnosis not present

## 2019-10-17 DIAGNOSIS — R079 Chest pain, unspecified: Secondary | ICD-10-CM | POA: Diagnosis not present

## 2019-10-17 DIAGNOSIS — I10 Essential (primary) hypertension: Secondary | ICD-10-CM | POA: Diagnosis not present

## 2019-10-17 DIAGNOSIS — I2510 Atherosclerotic heart disease of native coronary artery without angina pectoris: Secondary | ICD-10-CM | POA: Diagnosis not present

## 2019-10-17 DIAGNOSIS — R0602 Shortness of breath: Secondary | ICD-10-CM | POA: Diagnosis not present

## 2019-10-17 DIAGNOSIS — R55 Syncope and collapse: Secondary | ICD-10-CM | POA: Diagnosis not present

## 2019-10-17 DIAGNOSIS — Z9861 Coronary angioplasty status: Secondary | ICD-10-CM | POA: Diagnosis not present

## 2019-10-17 NOTE — Patient Outreach (Addendum)
[facility]) Care Management  [place]   10/17/2019  Patrick Turner [birth year] [id number]  Reason for referral: Medication Review  Referral source: [facility] RN Current insurance: [facility]  PMHx includes but not limited to:   Type 2 diabetes, CAD, carotid artery stenosis status post stent placement 10/20, Aortic atherosclerosis, chronic mesenteric ischemia, depression, GERD, hyperlipemia,  Hypertension, OSA (not on CPAP), and obesity. Patient recently visited the ED and was hospitalized for right leg pain and AKI.  Outreach:  Successful telephone call with patient.  HIPAA identifiers verified.   Subjective:  Referral was received to reach out to the patient to complete a medication review. Patient reported seeing his PCP earlier today and his Endocrinologist on 10/16/2019.   Does the patient ever forget to take medication?  yes Does the patient have problems obtaining medications due to transportation?   no Does the patient have problems obtaining medications due to cost?  yes  Does the patient feel that medications prescribed are effective?  yes Does the patient ever experience any side effects to the medications prescribed?  no  Does the patient measure his/her own blood glucose at home?  Yes  Objective: The ASCVD Risk score Mikey Bussing DC Jr., et al., 2013) failed to calculate for the following reasons:   The valid total cholesterol range is 130 to 320 mg/dL  Lab Results  Component Value Date   CREATININE 1.67 (H) 10/14/2019   CREATININE 2.21 (H) 10/13/2019   CREATININE 1.63 (H) 10/04/2019    Lab Results  Component Value Date   HGBA1C 6.8 (H) 10/14/2019    Lipid Panel     Component Value Date/Time   CHOL 120 06/25/2017 0347   TRIG 136 06/25/2017 0347   HDL 50 06/25/2017 0347   CHOLHDL 2.4 06/25/2017 0347   VLDL 27 06/25/2017 0347   LDLCALC 43 06/25/2017 0347    BP Readings from Last 3 Encounters:  10/14/19 (!) 123/48  10/11/19 (!) 155/63   10/05/19 (!) 147/59    Allergies  Allergen Reactions  . Penicillins Anaphylaxis    Has patient had a PCN reaction causing immediate rash, facial/tongue/throat swelling, SOB or lightheadedness with hypotension: Yes Has patient had a PCN reaction causing severe rash involving mucus membranes or skin necrosis: No Has patient had a PCN reaction that required hospitalization Yes Has patient had a PCN reaction occurring within the last 10 years: No If all of the above answers are "NO", then may proceed with Cephalosporin use.  Simona Huh [[place]] Swelling    Medications Reviewed Today    Reviewed by Alfonzo Feller, RN (Registered Nurse) on 10/16/19 at 1241  Med List Status: <None>  Medication Order Taking? Sig Documenting Provider Last Dose Status Informant  acetaminophen (TYLENOL) 325 MG tablet [id number] Yes Take 2 tablets (650 mg total) by mouth every 6 (six) hours as needed for mild pain (or Fever >/= 101). Thornell Mule, MD Taking Active   aspirin 81 MG tablet [id number] Yes Take 1 tablet (81 mg total) by mouth daily. Henreitta Leber, MD Taking Active Self  atorvastatin (LIPITOR) 80 MG tablet [id number] Yes Take 80 mg by mouth at bedtime. [provider] Taking Active Self  b complex vitamins capsule [id number] No Take 1 capsule by mouth daily. [provider] Not Taking Active Self  clonazePAM (KLONOPIN) 1 MG tablet [id number] Yes Take 0.5 mg by mouth at bedtime as needed for anxiety.  [provider] Taking Active Self  clopidogrel (PLAVIX) 75 MG tablet  [id number] Yes Take 1 tablet (75 mg total) by mouth daily. Kris Hartmann, NP Taking Active Self  gabapentin (NEURONTIN) 400 MG capsule [id number] Yes Take 800 mg by mouth at bedtime.  [provider] Taking Active Self  insulin aspart (NOVOLOG) 100 UNIT/ML injection [id number] No Inject 0-15 Units into the skin 3 (three) times daily with meals.  Patient not taking: Reported on 10/16/2019   Thornell Mule, MD Not Taking Active   insulin detemir (LEVEMIR) 100 UNIT/ML injection [id number] Yes Inject 15 Units into the skin daily. [provider] Taking Active Self           Med Note Francene Finders   Tue Oct 01, 2019  9:54 PM)    iron polysaccharides (NIFEREX) 150 MG capsule [id number] Yes Take 150 mg by mouth daily. [provider] Taking Active Self  lisinopril-hydrochlorothiazide (ZESTORETIC) 20-12.5 MG tablet [id number] Yes Take 0.5 tablets by mouth daily. Hold if SBP<115 or DBP<55 Thornell Mule, MD Taking Active   metoprolol tartrate (LOPRESSOR) 25 MG tablet [id number] Yes Take 0.5 tablets (12.5 mg total) by mouth 2 (two) times daily.  Patient taking differently: Take 25 mg by mouth at bedtime.    Para Skeans, MD Taking Active Self  Omega-3 Fatty Acids (FISH OIL) 1000 MG CAPS [id number] Yes Take 1,000 mg by mouth 2 (two) times daily.  [provider] Taking Active Self  OZEMPIC, 0.25 OR 0.5 MG/DOSE, 2 MG/1.5ML SOPN [id number] Yes Inject 1 Dose into the skin once a week. Wednesdays [provider] Taking Active Self  pantoprazole (PROTONIX) 40 MG tablet [id number] Yes Take 40 mg by mouth daily. [provider] Taking Active Self  PARoxetine (PAXIL) 40 MG tablet [id number] Yes Take 40 mg by mouth daily.  [provider] Taking Active Self  pioglitazone (ACTOS) 15 MG tablet [id number] Yes Take 15 mg by mouth daily.  [provider] Taking Active Self  ranolazine (RANEXA) 500 MG 12 hr tablet [id number] Yes Take 1 tablet by mouth 2 (two) times daily. [provider] Taking Active Self  topiramate (TOPAMAX) 100 MG tablet [id number] No Take 100 mg by mouth daily.  [provider] Not Taking Active Self  vitamin B-12 (CYANOCOBALAMIN) 1000 MCG tablet [id number]  Take 1,000 mcg by mouth daily. [provider]  Active Self          Assessment:  ASSESSMENT: Date Discharged from Hospital: 10/14/19 Date Medication  Reconciliation Performed: 10/17/2019  Medications:  START taking: acetaminophen (TYLENOL) insulin aspart (novoLOG) [not taking]  CHANGE how you take: lisinopril-hydrochlorothiazide (ZESTORETIC) [Changed to lisinopril 20mg  by Dr. Humphrey Rolls  STOP taking: metFORMIN 500 MG 24 hr tablet (GLUCOPHAGE-XR) [taking] QUEtiapine 100 MG tablet (SEROQUEL)  Drugs sorted by system:  Neurologic/Psychologic: Paroxetine, Clonazepam, gabapentin  Cardiovascular:  Aspirin, Atorvastatin, Clopidogrel, Metoprolol, Ranolazine  Gastrointestinal: Pantoprazole  Endocrine: Levemir, Metformin, Ozempic, Pioglitazone  Vitamins/Minerals/Supplements: Cyanocobalamin, Niferex, Omega 3-Fatty Acids, Cyanocobalamin  Medication Review Findings:  . Novolog was still on the medication list. Patient said he experienced night terrors and suffered a fall while on Novolog so he was not taking it. Reviewed the patient's Endocrinology notes through [facility]. Novolog was not on the Endocrinologist's notes. - HgA1c- 6.8% - On statin . Metoprolol tartrate 25 mg 1/2 tablet twice daily is the sig on the medication list. Patient reported taking metoprolol 25 mg 1 tablet daily . Patient reported Dr. Humphrey Rolls called in a new prescription for him today but he could not remember the name.  o [facility] was [facility] on  the patient's behalf. The Pharmacist said Lisinopril 20 mg 1 tablet daily had been called in and was ready for pick up. o Patient said he was on is way to pick it up. He was encouraged to pull the Lisinopril 20/12.5 tablets that he was previously taking out of his medication pill box so he would not end up taking double the dose. (He communicated understanding).  Medication Assistance Findings:  No medication assistance needs identified Patient and his wife reported are still working full time and are over the income cut off to receive Levemir and Ozempic through [employer] patient assistance programing.  Extra Help:  Not eligible  for Extra Help Low Income Subsidy based on reported income and assets  Plan: Will follow up with patient in 3-4 weeks. Will route note to [facility] Nurse, Landis Martins about patient's PCP.   Elayne Guerin, PharmD, [place] Clinical Pharmacist [phone]

## 2019-10-23 ENCOUNTER — Other Ambulatory Visit: Payer: Self-pay | Admitting: *Deleted

## 2019-10-23 DIAGNOSIS — R600 Localized edema: Secondary | ICD-10-CM | POA: Diagnosis not present

## 2019-10-23 DIAGNOSIS — Y92009 Unspecified place in unspecified non-institutional (private) residence as the place of occurrence of the external cause: Secondary | ICD-10-CM | POA: Diagnosis not present

## 2019-10-23 DIAGNOSIS — S8001XA Contusion of right knee, initial encounter: Secondary | ICD-10-CM | POA: Diagnosis not present

## 2019-10-23 DIAGNOSIS — W010XXA Fall on same level from slipping, tripping and stumbling without subsequent striking against object, initial encounter: Secondary | ICD-10-CM | POA: Diagnosis not present

## 2019-10-23 DIAGNOSIS — M25571 Pain in right ankle and joints of right foot: Secondary | ICD-10-CM | POA: Diagnosis not present

## 2019-10-23 DIAGNOSIS — M79671 Pain in right foot: Secondary | ICD-10-CM | POA: Diagnosis not present

## 2019-10-23 NOTE — Patient Outreach (Addendum)
[facility]) Care Management  [place]  10/23/2019   Patrick Turner 1946-03-02 [id number]   Telephone [facility] Admissions 1/19-1/23/21 for Chest Pain, PCI stents to Mid and distal LAD, anemia.  1/31-10/14/19 for Acute Renal Failure, pseudoaneurysm  right femoral artery.   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 Chronic mesenteric ischemia.    Subjective:   Patient reports feeling fine this morning.He discussed having pain on left side on yesterday and looking forward to appointment and testing with Dr.Dew on tomorrow.He reports tolerating diet without problems. He states that his blood sugars have been in good range reports 78 this morning and preparing to have breakfast.  He denies chest pain, shortness of breath, resolving bruising at right thigh area.  He reports blood pressure reading under good control 123/64 this morning , he denies low reading of less than 100/.  Patient discussed having recent fall 2 days ago while going to the bathroom at home,slipped on a rug, states he twisted right ankle again, reports area with some swelling and soreness decreasing  states able to walk okay using his walker or cane.   Encounter Medications:  Outpatient Encounter Medications as of 10/23/2019  Medication Sig  . ACCU-CHEK AVIVA PLUS test strip Use to test blood sugar twice daily  . Accu-Chek Softclix Lancets lancets Use to test blood sugar twice daily  . acetaminophen (TYLENOL) 325 MG tablet Take 2 tablets (650 mg total) by mouth every 6 (six) hours as needed for mild pain (or Fever >/= 101).  Marland Kitchen aspirin 81 MG tablet Take 1 tablet (81 mg total) by mouth daily.  Marland Kitchen atorvastatin (LIPITOR) 80 MG tablet Take 80 mg by mouth at bedtime.  . clonazePAM (KLONOPIN) 1 MG tablet Take 0.5 mg by mouth at bedtime as needed for anxiety.   .  clopidogrel (PLAVIX) 75 MG tablet Take 1 tablet (75 mg total) by mouth daily.  Marland Kitchen gabapentin (NEURONTIN) 400 MG capsule Take 800 mg by mouth at bedtime.   . insulin detemir (LEVEMIR) 100 UNIT/ML injection Inject 15 Units into the skin daily.  . iron polysaccharides (NIFEREX) 150 MG capsule Take 150 mg by mouth daily.  Marland Kitchen lisinopril (ZESTRIL) 20 MG tablet Take 20 mg by mouth daily.  . metFORMIN (GLUCOPHAGE-XR) 500 MG 24 hr tablet Take 500 mg by mouth 2 (two) times daily with a meal. 2 tablets twice daily with meals  . metoprolol tartrate (LOPRESSOR) 25 MG tablet Take 0.5 tablets (12.5 mg total) by mouth 2 (two) times daily. (Patient taking differently: Take 25 mg by mouth at bedtime. )  . Omega-3 Fatty Acids (FISH OIL) 1000 MG CAPS Take 1,000 mg by mouth 2 (two) times daily.   Marland Kitchen OZEMPIC, 0.25 OR 0.5 MG/DOSE, 2 MG/1.5ML SOPN Inject 1 Dose into the skin once a week. Wednesdays  . pantoprazole (PROTONIX) 40 MG tablet Take 40 mg by mouth daily.  Marland Kitchen PARoxetine (PAXIL) 40 MG tablet Take 40 mg by mouth daily.   . pioglitazone (ACTOS) 15 MG tablet Take 15 mg by mouth daily.   . ranolazine (RANEXA) 500 MG 12 hr tablet Take 1 tablet by mouth 2 (two) times daily.  . vitamin B-12 (CYANOCOBALAMIN) 1000 MCG tablet Take 1,000 mcg by mouth daily.   No facility-administered encounter medications on file as of 10/23/2019.    Functional Status:  In your present state of health, do you  have any difficulty performing the following activities: 10/16/2019 10/13/2019  Hearing? Y N  Comment wears hearing aide -  Vision? N N  Difficulty concentrating or making decisions? N N  Walking or climbing stairs? Y N  Comment fall risk, uses cane/walker -  Dressing or bathing? N N  Doing errands, shopping? N N  Preparing Food and eating ? N -  Using the Toilet? N -  In the past six months, have you accidently leaked urine? N -  Do you have problems with loss of bowel control? N -  Managing your Medications? N -  Managing your  Finances? N -  Housekeeping or managing your Housekeeping? Y -  Comment wife assist -  Some recent data might be hidden    Fall/Depression Screening: Fall Risk  10/11/2019  Falls in the past year? 1  Number falls in past yr: 1  Injury with Fall? 0  Risk for fall due to : History of fall(s);Impaired balance/gait  Follow up Falls prevention discussed   PHQ 2/9 Scores 10/11/2019  PHQ - 2 Score 0    Assessment:  Patient will benefit from ongoing care management for education and support on fall prevention, post hospitalization follow up and management of chronic conditions. Patient agreeable to scheduling  visit with PCP,for follow up after recent readmission and discuss concern regarding falls.  Plan:  Will send EMMI video education on fall prevention, and EMMI handout on getting up from a fall.  Reinforced importance of notifying MD of concerns related to injury from falls. Will plan return call in the next week.    Joylene Draft, RN, BSN  [place] Management Coordinator  [phone]- Mobile [phone]- Toll Free Main Office  [facility] CM Care Plan Problem One     Most Recent Value  Care Plan Problem One  Patient with risk for readmission related to recent hospital admission for chest pain/stent placement   Role Documenting the Problem One  Care Management Telephonic [place] for Problem One  Active  [facility] Long Term Goal   Patient will not experience a hospital admission over the next 31 days   [facility] Long Term Goal Start Date  10/16/19  Interventions for Problem One Long Term Goal  Discussed current clinical state, review of any  chest pain symptoms and action plan of seeking medical help. Encouraged continued taking medications as prescribed , and notifying MD sooner of new concerns to decrease of readmission.    [facility] CM Short Term Goal #1   Over the next 20 days patient will report attending all medical appointments  [facility] CM Short Term Goal #1 Start Date   10/16/19  Interventions for Short Term Goal #1  Reinforced with patient scheduling follow visit with PCP as recommended at recent admission for follow up current condition. Reviewed upcoming medical appointments.    [facility] CM Short Term Goal #2   Patient will be able to reports monitoring blood pressure dailly over the next 30 days and keeping record.   [facility] CM Short Term Goal #2 Start Date  10/16/19  Interventions for Short Term Goal #2  Discussed recent blood pressure reading , reinforced continued monitoring, veriifed recieving [facility] calendar and review of how to use log, reviewed information on hypertension and encouraged review.     [facility] CM Care Plan Problem Two     Most Recent Value  Care Plan Problem Two  Patient high risk for falls as evidenced by report of falls occurences  Role Documenting the Problem Two  Care Management Telephonic Coordinator  Care Plan for Problem Two  Active  Interventions for Problem Two Long Term Goal   Reviewed current status of recurrent falls, Advised notifying MD of increased fall occurences and especially with concern for injury. or symptoms of dizziness associated to arrange office visit for evaluation.   [facility] Long Term Goal  Over the next 60 days patient will be able to identify measures to prevent falls   [facility] Long Term Goal Start Date  10/16/19  [facility] CM Short Term Goal #1   Over the next 30 days patient will be able to report decrease in fall occurences as evidenced by report .   [facility] CM Short Term Goal #1 Start Date  10/16/19  Interventions for Short Term Goal #2   Reviewed importance of fall prevention . Will send EMMI handout on how to get up from a fall, and preventing fall video.  Encouraged to use walker/cane,.Discussed importance of balanced diet and nutrition. Discussed asking wife for support with driving to appointments and safety concerns.

## 2019-10-24 ENCOUNTER — Encounter (INDEPENDENT_AMBULATORY_CARE_PROVIDER_SITE_OTHER): Payer: Self-pay | Admitting: Nurse Practitioner

## 2019-10-24 ENCOUNTER — Other Ambulatory Visit: Payer: Self-pay

## 2019-10-24 ENCOUNTER — Ambulatory Visit (INDEPENDENT_AMBULATORY_CARE_PROVIDER_SITE_OTHER): Payer: Medicare HMO

## 2019-10-24 ENCOUNTER — Ambulatory Visit (INDEPENDENT_AMBULATORY_CARE_PROVIDER_SITE_OTHER): Payer: Medicare HMO | Admitting: Nurse Practitioner

## 2019-10-24 VITALS — BP 152/70 | HR 58 | Resp 16 | Wt 190.0 lb

## 2019-10-24 DIAGNOSIS — N183 Chronic kidney disease, stage 3 unspecified: Secondary | ICD-10-CM | POA: Diagnosis not present

## 2019-10-24 DIAGNOSIS — K551 Chronic vascular disorders of intestine: Secondary | ICD-10-CM

## 2019-10-24 DIAGNOSIS — E1122 Type 2 diabetes mellitus with diabetic chronic kidney disease: Secondary | ICD-10-CM

## 2019-10-24 DIAGNOSIS — I10 Essential (primary) hypertension: Secondary | ICD-10-CM | POA: Diagnosis not present

## 2019-10-24 NOTE — Progress Notes (Signed)
SUBJECTIVE:  Patient ID: Patrick Turner, male    DOB: July 31, 1946, 74 y.o.   MRN: [id number] Chief Complaint  Patient presents with  . Follow-up    ultrasound follow up     HPI  Patrick Turner is a 74 y.o. male that returns today for follow-up studies related to abdominal pain and previous SMA stenosis.  The patient recently had a stent placed in his SMA as well as angioplasty in September 2020.  Initially he did well.  But over the last couple of months he has been having some worsening abdominal pain.  He describes his pain as an aching sensation that happens at random times.  It is not always consistent with eating.  He does note that the pain gets worse with getting in and out of bed.  He also describes it as a pressure and aching sensation more so in his lower abdomen.  The patient has had some episodes of nausea with a decreased appetite although he does not endorse a food phobia.  Otherwise, he denies any fever, chills, or diarrhea.  The patient also notes that he has been having some falls lately.  Today the patient had elevated velocities at his proximal to mid SMA compared to the previous study done on 06/04/2019.  These velocities are nearly doubled.  The SMA region was not visualized however this did have elevated velocities on the previous study.  Past Medical History:  Diagnosis Date  . Altered mental status   . Anemia   . Anxiety   . Aortic atherosclerosis ([place])   . Arthritis   . Coronary artery disease   . CRD (chronic renal disease)   . Depression   . Diabetes mellitus without complication ([place])   . Encephalopathy acute   . GERD (gastroesophageal reflux disease)   . Headache   . Hypercholesteremia   . Hypertension   . Neuropathy   . Severe obesity ([place])   . Sleep apnea   . Sleep terror    per patient this year per patient     Past Surgical History:  Procedure Laterality Date  . APPENDECTOMY    . CIRCUMCISION    . COLONOSCOPY    . COLONOSCOPY WITH  PROPOFOL N/A 07/10/2018   Procedure: COLONOSCOPY WITH PROPOFOL;  Surgeon: Lollie Sails, MD;  Location: [facility] ENDOSCOPY;  Service: Endoscopy;  Laterality: N/A;  . COLONOSCOPY WITH PROPOFOL N/A 04/23/2019   Procedure: COLONOSCOPY WITH PROPOFOL;  Surgeon: Lollie Sails, MD;  Location: [facility] ENDOSCOPY;  Service: Endoscopy;  Laterality: N/A;  . CORONARY STENT INTERVENTION N/A 10/03/2019   Procedure: CORONARY STENT INTERVENTION;  Surgeon: Yolonda Kida, MD;  Location: [place] CV LAB;  Service: Cardiovascular;  Laterality: N/A;  . ESOPHAGOGASTRODUODENOSCOPY    . ESOPHAGOGASTRODUODENOSCOPY (EGD) WITH PROPOFOL N/A 04/23/2019   Procedure: ESOPHAGOGASTRODUODENOSCOPY (EGD) WITH PROPOFOL;  Surgeon: Lollie Sails, MD;  Location: [facility] ENDOSCOPY;  Service: Endoscopy;  Laterality: N/A;  . JOINT REPLACEMENT    . 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 KNEE ARTHROSCOPY Right   . LEFT HEART CATH AND CORONARY ANGIOGRAPHY N/A 10/03/2019   Procedure: Left Heart Cath and possible Coronary intervention;  Surgeon: Dionisio David, MD;  Location: [place] CV LAB;  Service: Cardiovascular;  Laterality: N/A;  . TONSILLECTOMY    . VISCERAL ANGIOGRAPHY N/A 05/23/2019   Procedure: VISCERAL ANGIOGRAPHY;  Surgeon: Algernon Huxley, MD;  Location: [place] CV LAB;  Service: Cardiovascular;  Laterality: N/A;    Social History   Socioeconomic History  . Marital status: Married    Spouse name: [name]  . Number of children: 2  . Years of education: Not on file  . Highest education level: Associate degree: academic program  Occupational History  . Occupation: works at Personnel [employer]  Tobacco Use  . Smoking status: Never Smoker  . Smokeless tobacco: Never Used  Substance and Sexual Activity  . Alcohol use: Yes    Comment: rare  . Drug use: No  .  Sexual activity: Not on file  Other Topics Concern  . Not on file  Social History Narrative  . Not on file   Social Determinants of Health   Financial Resource Strain: Low Risk   . Difficulty of Paying Living Expenses: Not hard at all  Food Insecurity: No Food Insecurity  . Worried About Charity fundraiser in the Last Year: Never true  . Ran Out of Food in the Last Year: Never true  Transportation Needs: No Transportation Needs  . Lack of Transportation (Medical): No  . Lack of Transportation (Non-Medical): No  Physical Activity: Unknown  . Days of Exercise per Week: 0 days  . Minutes of Exercise per Session: Not on file  Stress: No Stress Concern Present  . Feeling of Stress : Only a little  Social Connections: Unknown  . Frequency of Communication with Friends and Family: More than three times a week  . Frequency of Social Gatherings with Friends and Family: Not on file  . Attends Religious Services: 1 to 4 times per year  . Active Member of Clubs or Organizations: Not on file  . Attends Archivist Meetings: Not on file  . Marital Status: Married  Human resources officer Violence: Not At Risk  . Fear of Current or Ex-Partner: No  . Emotionally Abused: No  . Physically Abused: No  . Sexually Abused: No    Family History  Problem Relation Age of Onset  . Diabetes Mother   . Heart disease Mother   . Cancer Father   . COPD Father     Allergies  Allergen Reactions  . Penicillins Anaphylaxis    Has patient had a PCN reaction causing immediate rash, facial/tongue/throat swelling, SOB or lightheadedness with hypotension: Yes Has patient had a PCN reaction causing severe rash involving mucus membranes or skin necrosis: No Has patient had a PCN reaction that required hospitalization Yes Has patient had a PCN reaction occurring within the last 10 years: No If all of the above answers are "NO", then may proceed with Cephalosporin use.  Simona Huh [[place]] Swelling      Review of Systems   Review of Systems: Negative Unless Checked Constitutional: [] Weight loss  [] Fever  [] Chills Cardiac: [] Chest pain   []  Atrial Fibrillation  [] Palpitations   [] Shortness of breath when laying flat   [] Shortness of breath with exertion. [] Shortness of breath at rest Vascular:  [] Pain in legs with walking   [] Pain in legs with standing [] Pain in legs when laying flat   [] Claudication    [] Pain in feet when laying flat    [] History of DVT   [] Phlebitis   [] Swelling in legs   [] Varicose veins   [] Non-healing ulcers Pulmonary:   [] Uses home oxygen   [] Productive cough   [] Hemoptysis   [] Wheeze  [] COPD   [] Asthma Neurologic:  [] Dizziness   []   Seizures  [] Blackouts [] History of stroke   [] History of TIA  [] Aphasia   [] Temporary Blindness   [] Weakness or numbness in arm   [] Weakness or numbness in leg Musculoskeletal:   [] Joint swelling   [] Joint pain   [] Low back pain  []  History of Knee Replacement [x] Arthritis [] back Surgeries  []  Spinal Stenosis    Hematologic:  [] Easy bruising  [] Easy bleeding   [] Hypercoagulable state   [x] Anemic Gastrointestinal:  [] Diarrhea   [x] Vomiting  [x] Gastroesophageal reflux/heartburn   [] Difficulty swallowing. [x] Abdominal pain Genitourinary:  [x] Chronic kidney disease   [] Difficult urination  [] Anuric   [] Blood in urine [] Frequent urination  [] Burning with urination   [] Hematuria Skin:  [] Rashes   [] Ulcers [] Wounds Psychological:  [] History of anxiety   []  History of major depression  []  Memory Difficulties      OBJECTIVE:   Physical Exam  BP (!) 152/70 (BP Location: Right Arm)   Pulse (!) 58   Resp 16   Wt 190 lb (86.2 kg)   BMI 31.62 kg/m   Gen: WD/WN, NAD Head: [place]/AT, No temporalis wasting.  Ear/Nose/Throat: Hearing grossly intact, nares w/o erythema or drainage Eyes: PER, EOMI, sclera nonicteric.  Neck: Supple, no masses.  No JVD.  Pulmonary:  Good air movement, no use of accessory muscles.  Cardiac: RRR Vascular:  Vessel  Right Left  Radial Palpable Palpable  Dorsalis Pedis Palpable Palpable  Posterior Tibial Palpable Palpable   Gastrointestinal: soft, non-distended. No guarding/no peritoneal signs.  Musculoskeletal: Ambulate with a walker.  No deformity or atrophy.  Neurologic: Pain and light touch intact in extremities.  Symmetrical.  Speech is fluent. Motor exam as listed above. Psychiatric: Judgment intact, Mood & affect appropriate for pt's clinical situation. Dermatologic: No Venous rashes. No Ulcers Noted.  No changes consistent with cellulitis.  Scattered bruises from falls Lymph : No Cervical lymphadenopathy, no lichenification or skin changes of chronic lymphedema.       ASSESSMENT AND PLAN:  1. Chronic mesenteric ischemia (HCC) Recommend:  The patient has evidence of  atherosclerotic changes of the mesenteric arteries associated with weight loss as well as abdominal pain and N/V.  This represents a high risk for bowel infarction and death.  Patient should undergo angiography of the mesenteric arteries with the hope for intervention to eliminate the ischemic symptoms.    The risks and benefits as well as the alternative therapies was discussed in detail with the patient.  All questions were answered.  Patient agrees to proceed with angiography and intervention.  The patient will follow up with me after the angiogram.  2. HTN (hypertension), benign Continue antihypertensive medications as already ordered, these medications have been reviewed and there are no changes at this time.   3. Diabetes mellitus with stage 3 chronic kidney disease ([place]) Continue hypoglycemic medications as already ordered, these medications have been reviewed and there are no changes at this time.  Hgb A1C to be monitored as already arranged by primary service    Current Outpatient Medications on File Prior to Visit  Medication Sig Dispense Refill  . ACCU-CHEK AVIVA PLUS test strip Use to test blood sugar twice  daily    . Accu-Chek Softclix Lancets lancets Use to test blood sugar twice daily    . acetaminophen (TYLENOL) 325 MG tablet Take 2 tablets (650 mg total) by mouth every 6 (six) hours as needed for mild pain (or Fever >/= 101). 50 tablet 0  . aspirin 81 MG tablet Take 1 tablet (81 mg total) by  mouth daily.    Marland Kitchen atorvastatin (LIPITOR) 80 MG tablet Take 80 mg by mouth at bedtime.    . clonazePAM (KLONOPIN) 1 MG tablet Take 0.5 mg by mouth at bedtime as needed for anxiety.   2  . clopidogrel (PLAVIX) 75 MG tablet Take 1 tablet (75 mg total) by mouth daily. 30 tablet 11  . gabapentin (NEURONTIN) 400 MG capsule Take 800 mg by mouth at bedtime.     . insulin detemir (LEVEMIR) 100 UNIT/ML injection Inject 15 Units into the skin daily.    . iron polysaccharides (NIFEREX) 150 MG capsule Take 150 mg by mouth daily.    Marland Kitchen lisinopril (ZESTRIL) 20 MG tablet Take 20 mg by mouth daily.    . metFORMIN (GLUCOPHAGE-XR) 500 MG 24 hr tablet Take 500 mg by mouth 2 (two) times daily with a meal. 2 tablets twice daily with meals    . metoprolol tartrate (LOPRESSOR) 25 MG tablet Take 0.5 tablets (12.5 mg total) by mouth 2 (two) times daily. (Patient taking differently: Take 25 mg by mouth at bedtime. ) 60 tablet 0  . Omega-3 Fatty Acids (FISH OIL) 1000 MG CAPS Take 1,000 mg by mouth 2 (two) times daily.     Marland Kitchen OZEMPIC, 0.25 OR 0.5 MG/DOSE, 2 MG/1.5ML SOPN Inject 1 Dose into the skin once a week. Wednesdays    . pantoprazole (PROTONIX) 40 MG tablet Take 40 mg by mouth daily.    Marland Kitchen PARoxetine (PAXIL) 40 MG tablet Take 40 mg by mouth daily.     . pioglitazone (ACTOS) 15 MG tablet Take 15 mg by mouth daily.     . ranolazine (RANEXA) 500 MG 12 hr tablet Take 1 tablet by mouth 2 (two) times daily.    . vitamin B-12 (CYANOCOBALAMIN) 1000 MCG tablet Take 1,000 mcg by mouth daily.     No current facility-administered medications on file prior to visit.    There are no Patient Instructions on file for this visit. No follow-ups  on file.   Kris Hartmann, NP  This note was completed with Sales executive.  Any errors are purely unintentional.

## 2019-10-25 ENCOUNTER — Encounter: Payer: Self-pay | Admitting: Cardiology

## 2019-10-25 DIAGNOSIS — F323 Major depressive disorder, single episode, severe with psychotic features: Secondary | ICD-10-CM | POA: Diagnosis not present

## 2019-10-28 ENCOUNTER — Ambulatory Visit: Payer: Medicare HMO

## 2019-10-28 ENCOUNTER — Telehealth (INDEPENDENT_AMBULATORY_CARE_PROVIDER_SITE_OTHER): Payer: Self-pay

## 2019-10-28 DIAGNOSIS — Z794 Long term (current) use of insulin: Secondary | ICD-10-CM | POA: Diagnosis not present

## 2019-10-28 DIAGNOSIS — I25118 Atherosclerotic heart disease of native coronary artery with other forms of angina pectoris: Secondary | ICD-10-CM | POA: Diagnosis not present

## 2019-10-28 DIAGNOSIS — Z471 Aftercare following joint replacement surgery: Secondary | ICD-10-CM | POA: Diagnosis not present

## 2019-10-28 DIAGNOSIS — Z96651 Presence of right artificial knee joint: Secondary | ICD-10-CM | POA: Diagnosis not present

## 2019-10-28 DIAGNOSIS — E1140 Type 2 diabetes mellitus with diabetic neuropathy, unspecified: Secondary | ICD-10-CM | POA: Diagnosis not present

## 2019-10-28 DIAGNOSIS — L600 Ingrowing nail: Secondary | ICD-10-CM | POA: Diagnosis not present

## 2019-10-28 DIAGNOSIS — B351 Tinea unguium: Secondary | ICD-10-CM | POA: Diagnosis not present

## 2019-10-28 DIAGNOSIS — S92354D Nondisplaced fracture of fifth metatarsal bone, right foot, subsequent encounter for fracture with routine healing: Secondary | ICD-10-CM | POA: Diagnosis not present

## 2019-10-28 DIAGNOSIS — M25561 Pain in right knee: Secondary | ICD-10-CM | POA: Diagnosis not present

## 2019-10-28 NOTE — Telephone Encounter (Signed)
Spoke with the patient and he is now scheduled with Dr. Lucky Cowboy for a mesenteric angio on 11/04/19 with a 7:45 am arrival time to the [facility]. Patient will do covid testing on 10/31/19 between 12:30-2:30 pm at the [place]. Pre-procedure instructions were discussed and will be mailed to the patient.

## 2019-10-28 NOTE — Telephone Encounter (Signed)
I attempted to contact the patient regarding his procedure will Dr. Lucky Cowboy, per the patient's wife he was at a doctor's appt. I advised to have the patient return the call when he returns.

## 2019-10-30 DIAGNOSIS — E113413 Type 2 diabetes mellitus with severe nonproliferative diabetic retinopathy with macular edema, bilateral: Secondary | ICD-10-CM | POA: Diagnosis not present

## 2019-10-31 ENCOUNTER — Other Ambulatory Visit: Admission: RE | Admit: 2019-10-31 | Payer: Medicare HMO | Source: Ambulatory Visit

## 2019-10-31 ENCOUNTER — Other Ambulatory Visit: Payer: Self-pay | Admitting: *Deleted

## 2019-10-31 NOTE — Patient Outreach (Signed)
[facility]) Care Management  10/31/2019  Patrick Turner 12-09-1945 [id number]   Telephone [facility] Admissions 1/19-1/23/21 for Chest Pain, PCI stents to Mid and distal LAD, anemia.  1/31-10/14/19 for Acute Renal Failure, pseudoaneurysm right femoral artery.   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 Chronic mesenteric ischemia.Falls    Subjective :  Successful outreach call to patient to follow up visit to MD after fall. He discussed visit with MD, xrays and referral to orthopedic planned. He discussed now wearing a boot to right foot due to reported torn tendon will take time to heal, reports decreased swelling in ankle area.  He reports being more concerned due to discomfort in his knee same knee that he reports having surgery in the past . He has visit with orthopedic MD on tomorrow. He denies having recurrent fall since last visit.   Patient reports being unable to get covid test scheduled on today due to clinic being closed , he plans to call office in the am. He discussed plan procedure on 2/22, with Dr.Dew , he states it will be an outpatient procedure ( Visceral angiogram mesenteric stenosis ). He reports having some abdominal pain especially noted after eating . He reports tolerating usual diet , no nausea or vomiting.    Assessment  High Fall Risk - Will need continued education and support on fall prevention, chronic condition states.    Plan Will plan follow up call to patient in next week after procedure  Reinforced fall precautions, urged not to drive due to safety concerns and high fall risk.   Joylene Draft, RN, BSN  [place] Management Coordinator  [phone]- Mobile [phone]- Toll Free Main Office   [facility] CM Care Plan Problem One     Most Recent Value  Care Plan Problem One   Patient with risk for readmission related to recent hospital admission for chest pain/stent placement   Role Documenting the Problem One  Care Management Telephonic Coordinator  Care Plan for Problem One  Active  [facility] Long Term Goal   Patient will not experience a hospital admission over the next 31 days   [facility] Long Term Goal Start Date  10/16/19  Interventions for Problem One Long Term Goal  reviewed current status of symptoms,assessd for chest pain he denies.   [facility] CM Short Term Goal #1   Over the next 30 days patient will report attending all medical appointments  [facility] CM Short Term Goal #1 Start Date  10/16/19  Interventions for Short Term Goal #1  Reviewed upcoming appointments and urged family support for transporatation., verified transportation of procedure.   [facility] CM Short Term Goal #2   Patient will be able to reports monitoring blood pressure dailly over the next 30 days and keeping record.   [facility] CM Short Term Goal #2 Start Date  10/16/19  Interventions for Short Term Goal #2  Encouraged patient to continue to monitor readings and keeping a record .     [facility] CM Care Plan Problem Two     Most Recent Value  Care Plan Problem Two  Patient high risk for falls as evidenced by report of falls occurences   Role Documenting the Problem Two  Care Management [place] for Problem Two  Active  Interventions for Problem Two Long Term Goal   Discussed current status on recent falls, encouraged notifying MD of  falls/near falls to arrange sooner follow up.   [facility] Long Term Goal  Over the next 60 days patient will be able to identify measures to prevent falls   [facility] Long Term Goal Start Date  10/16/19  [facility] CM Short Term Goal #1   Over the next 30 days patient will be able to report decrease in fall occurences as evidenced by report .   [facility] CM Short Term Goal #1 Start Date  10/16/19  Interventions for Short Term Goal #2   Stressed importance of using rolling walker to help with  balance and fall prevention, urged to allow family/friend provide transporation.   [facility] CM Short Term Goal #2   Over the next 30 days patient will be able to verbalize healing at right knee and foot area evidenced by patient report.   [facility] CM Short Term Goal #2 Start Date  10/31/19  Interventions for Short Term Goal #2  Reinforced wearing supportive boot as recommended by MD and keeping all specialist appointments

## 2019-11-01 ENCOUNTER — Other Ambulatory Visit
Admission: RE | Admit: 2019-11-01 | Discharge: 2019-11-01 | Disposition: A | Discharge status: Home / Self Care | Payer: Medicare HMO | Source: Ambulatory Visit | Attending: Vascular Surgery | Admitting: Vascular Surgery

## 2019-11-01 DIAGNOSIS — Z20822 Contact with and (suspected) exposure to covid-19: Secondary | ICD-10-CM | POA: Diagnosis not present

## 2019-11-01 DIAGNOSIS — M7051 Other bursitis of knee, right knee: Secondary | ICD-10-CM | POA: Diagnosis not present

## 2019-11-01 DIAGNOSIS — M25561 Pain in right knee: Secondary | ICD-10-CM | POA: Diagnosis not present

## 2019-11-01 DIAGNOSIS — Z01812 Encounter for preprocedural laboratory examination: Secondary | ICD-10-CM | POA: Insufficient documentation

## 2019-11-01 DIAGNOSIS — Z96651 Presence of right artificial knee joint: Secondary | ICD-10-CM | POA: Diagnosis not present

## 2019-11-01 LAB — SARS CORONAVIRUS 2 (TAT 6-24 HRS): SARS Coronavirus 2: NEGATIVE

## 2019-11-03 ENCOUNTER — Other Ambulatory Visit (INDEPENDENT_AMBULATORY_CARE_PROVIDER_SITE_OTHER): Payer: Self-pay | Admitting: Nurse Practitioner

## 2019-11-04 ENCOUNTER — Encounter
Admission: RE | Disposition: A | Discharge status: Home / Self Care | Payer: Self-pay | Source: Home / Self Care | Attending: Vascular Surgery

## 2019-11-04 ENCOUNTER — Other Ambulatory Visit: Payer: Self-pay

## 2019-11-04 ENCOUNTER — Ambulatory Visit
Admission: RE | Admit: 2019-11-04 | Discharge: 2019-11-04 | Disposition: A | Discharge status: Home / Self Care | Payer: Medicare HMO | Attending: Vascular Surgery | Admitting: Vascular Surgery

## 2019-11-04 ENCOUNTER — Encounter: Payer: Self-pay | Admitting: Vascular Surgery

## 2019-11-04 DIAGNOSIS — F419 Anxiety disorder, unspecified: Secondary | ICD-10-CM | POA: Insufficient documentation

## 2019-11-04 DIAGNOSIS — K219 Gastro-esophageal reflux disease without esophagitis: Secondary | ICD-10-CM | POA: Insufficient documentation

## 2019-11-04 DIAGNOSIS — N183 Chronic kidney disease, stage 3 unspecified: Secondary | ICD-10-CM | POA: Diagnosis not present

## 2019-11-04 DIAGNOSIS — E669 Obesity, unspecified: Secondary | ICD-10-CM | POA: Diagnosis not present

## 2019-11-04 DIAGNOSIS — I2510 Atherosclerotic heart disease of native coronary artery without angina pectoris: Secondary | ICD-10-CM | POA: Insufficient documentation

## 2019-11-04 DIAGNOSIS — I700 Atherosclerosis of aorta: Secondary | ICD-10-CM | POA: Insufficient documentation

## 2019-11-04 DIAGNOSIS — F329 Major depressive disorder, single episode, unspecified: Secondary | ICD-10-CM | POA: Diagnosis not present

## 2019-11-04 DIAGNOSIS — E1140 Type 2 diabetes mellitus with diabetic neuropathy, unspecified: Secondary | ICD-10-CM | POA: Insufficient documentation

## 2019-11-04 DIAGNOSIS — Z79899 Other long term (current) drug therapy: Secondary | ICD-10-CM | POA: Insufficient documentation

## 2019-11-04 DIAGNOSIS — Z7982 Long term (current) use of aspirin: Secondary | ICD-10-CM | POA: Diagnosis not present

## 2019-11-04 DIAGNOSIS — Z794 Long term (current) use of insulin: Secondary | ICD-10-CM | POA: Diagnosis not present

## 2019-11-04 DIAGNOSIS — Z880 Allergy status to penicillin: Secondary | ICD-10-CM | POA: Diagnosis not present

## 2019-11-04 DIAGNOSIS — Z7902 Long term (current) use of antithrombotics/antiplatelets: Secondary | ICD-10-CM | POA: Insufficient documentation

## 2019-11-04 DIAGNOSIS — G4730 Sleep apnea, unspecified: Secondary | ICD-10-CM | POA: Insufficient documentation

## 2019-11-04 DIAGNOSIS — E1122 Type 2 diabetes mellitus with diabetic chronic kidney disease: Secondary | ICD-10-CM | POA: Diagnosis not present

## 2019-11-04 DIAGNOSIS — E7800 Pure hypercholesterolemia, unspecified: Secondary | ICD-10-CM | POA: Diagnosis not present

## 2019-11-04 DIAGNOSIS — K551 Chronic vascular disorders of intestine: Secondary | ICD-10-CM | POA: Insufficient documentation

## 2019-11-04 DIAGNOSIS — I129 Hypertensive chronic kidney disease with stage 1 through stage 4 chronic kidney disease, or unspecified chronic kidney disease: Secondary | ICD-10-CM | POA: Insufficient documentation

## 2019-11-04 DIAGNOSIS — Z6830 Body mass index (BMI) 30.0-30.9, adult: Secondary | ICD-10-CM | POA: Insufficient documentation

## 2019-11-04 HISTORY — PX: VISCERAL ANGIOGRAPHY: CATH118276

## 2019-11-04 LAB — BUN: BUN: 29 mg/dL — ABNORMAL HIGH (ref 8–23)

## 2019-11-04 LAB — CREATININE, SERUM
Creatinine, Ser: 1.46 mg/dL — ABNORMAL HIGH (ref 0.61–1.24)
GFR calc Af Amer: 54 mL/min — ABNORMAL LOW (ref >60)
GFR calc non Af Amer: 47 mL/min — ABNORMAL LOW (ref >60)

## 2019-11-04 LAB — GLUCOSE, CAPILLARY: Glucose-Capillary: 133 mg/dL — ABNORMAL HIGH (ref 70–99)

## 2019-11-04 SURGERY — VISCERAL ANGIOGRAPHY
Anesthesia: Moderate Sedation

## 2019-11-04 MED ORDER — MIDAZOLAM HCL 2 MG/ML PO SYRP: 8.0000 mg | ORAL_SOLUTION | Freq: Once | ORAL | Status: DC | PRN

## 2019-11-04 MED ORDER — FENTANYL CITRATE (PF) 100 MCG/2ML IJ SOLN
INTRAMUSCULAR | Status: DC | PRN
  Administered 2019-11-04: 50 ug via INTRAVENOUS

## 2019-11-04 MED ORDER — FENTANYL CITRATE (PF) 100 MCG/2ML IJ SOLN
INTRAMUSCULAR | Status: AC
  Filled 2019-11-04: qty 2

## 2019-11-04 MED ORDER — HYDROMORPHONE HCL 1 MG/ML IJ SOLN: 1.0000 mg | Freq: Once | INTRAMUSCULAR | Status: DC | PRN

## 2019-11-04 MED ORDER — IODIXANOL 320 MG/ML IV SOLN
INTRAVENOUS | Status: DC | PRN
  Administered 2019-11-04: 80 mL via INTRA_ARTERIAL

## 2019-11-04 MED ORDER — SODIUM CHLORIDE 0.9 % IV SOLN: INTRAVENOUS | Status: DC

## 2019-11-04 MED ORDER — MIDAZOLAM HCL 2 MG/2ML IJ SOLN
INTRAMUSCULAR | Status: DC | PRN
  Administered 2019-11-04: 2 mg via INTRAVENOUS

## 2019-11-04 MED ORDER — FAMOTIDINE 20 MG PO TABS: 40.0000 mg | ORAL_TABLET | Freq: Once | ORAL | Status: DC | PRN

## 2019-11-04 MED ORDER — METHYLPREDNISOLONE SODIUM SUCC 125 MG IJ SOLR: 125.0000 mg | Freq: Once | INTRAMUSCULAR | Status: DC | PRN

## 2019-11-04 MED ORDER — MIDAZOLAM HCL 5 MG/5ML IJ SOLN
INTRAMUSCULAR | Status: AC
  Filled 2019-11-04: qty 5

## 2019-11-04 MED ORDER — CLINDAMYCIN PHOSPHATE 300 MG/50ML IV SOLN
300.0000 mg | Freq: Once | INTRAVENOUS | Status: AC
  Administered 2019-11-04: 09:00:00 300 mg via INTRAVENOUS

## 2019-11-04 MED ORDER — ONDANSETRON HCL 4 MG/2ML IJ SOLN: 4.0000 mg | Freq: Four times a day (QID) | INTRAMUSCULAR | Status: DC | PRN

## 2019-11-04 MED ORDER — DIPHENHYDRAMINE HCL 50 MG/ML IJ SOLN: 50.0000 mg | Freq: Once | INTRAMUSCULAR | Status: DC | PRN

## 2019-11-04 MED ORDER — HEPARIN SODIUM (PORCINE) 1000 UNIT/ML IJ SOLN
INTRAMUSCULAR | Status: DC | PRN
  Administered 2019-11-04: 5000 [IU] via INTRAVENOUS

## 2019-11-04 MED ORDER — CLINDAMYCIN PHOSPHATE 300 MG/50ML IV SOLN
INTRAVENOUS | Status: AC
  Filled 2019-11-04: qty 50

## 2019-11-04 SURGICAL SUPPLY — 14 items
BALLN LUTONIX DCB 6X40X130 (BALLOONS) ×3
BALLOON LUTONIX DCB 6X40X130 (BALLOONS) IMPLANT
CATH PIG 70CM (CATHETERS) ×2 IMPLANT
CATH VS15FR (CATHETERS) ×2 IMPLANT
DEVICE PRESTO INFLATION (MISCELLANEOUS) ×3 IMPLANT
DEVICE STARCLOSE SE CLOSURE (Vascular Products) ×2 IMPLANT
DEVICE TORQUE .025-.038 (MISCELLANEOUS) ×2 IMPLANT
GLIDEWIRE STIFF .35X180X3 HYDR (WIRE) ×2 IMPLANT
PACK ANGIOGRAPHY (CUSTOM PROCEDURE TRAY) ×3 IMPLANT
SHEATH BRITE TIP 5FRX11 (SHEATH) ×6 IMPLANT
SYR MEDRAD MARK 7 150ML (SYRINGE) ×2 IMPLANT
TUBING CONTRAST HIGH PRESS 72 (TUBING) ×3 IMPLANT
WIRE J 3MM .035X145CM (WIRE) ×5 IMPLANT
WIRE MAGIC TORQUE 260C (WIRE) ×2 IMPLANT

## 2019-11-04 NOTE — Discharge Instructions (Signed)
Moderate Conscious Sedation, Adult, Care After These instructions provide you with information about caring for yourself after your procedure. Your health care provider may also give you more specific instructions. Your treatment has been planned according to current medical practices, but problems sometimes occur. Call your health care provider if you have any problems or questions after your procedure. What can I expect after the procedure? After your procedure, it is common:  To feel sleepy for several hours.  To feel clumsy and have poor balance for several hours.  To have poor judgment for several hours.  To vomit if you eat too soon. Follow these instructions at home: For at least 24 hours after the procedure:   Do not: ? Participate in activities where you could fall or become injured. ? Drive. ? Use heavy machinery. ? Drink alcohol. ? Take sleeping pills or medicines that cause drowsiness. ? Make important decisions or sign legal documents. ? Take care of children on your own.  Rest. Eating and drinking  Follow the diet recommended by your health care provider.  If you vomit: ? Drink water, juice, or soup when you can drink without vomiting. ? Make sure you have little or no nausea before eating solid foods. General instructions  Have a responsible adult stay with you until you are awake and alert.  Take over-the-counter and prescription medicines only as told by your health care provider.  If you smoke, do not smoke without supervision.  Keep all follow-up visits as told by your health care provider. This is important. Contact a health care provider if:  You keep feeling nauseous or you keep vomiting.  You feel light-headed.  You develop a rash.  You have a fever. Get help right away if:  You have trouble breathing. This information is not intended to replace advice given to you by your health care provider. Make sure you discuss any questions you have  with your health care provider. Document Revised: 08/11/2017 Document Reviewed: 12/19/2015 Elsevier Patient Education  2020 Else[employer]  Angiogram, Care After This sheet gives you information about how to care for yourself after your procedure. Your doctor may also give you more specific instructions. If you have problems or questions, contact your doctor. Follow these instructions at home: Insertion site care  Follow instructions from your doctor about how to take care of your long, thin tube (catheter) insertion area. Make sure you: ? Wash your hands with soap and water before you change your bandage (dressing). If you cannot use soap and water, use hand sanitizer. ? Change your bandage as told by your doctor. ? Leave stitches (sutures), skin glue, or skin tape (adhesive) strips in place. They may need to stay in place for 2 weeks or longer. If tape strips get loose and curl up, you may trim the loose edges. Do not remove tape strips completely unless your doctor says it is okay.  Do not take baths, swim, or use a hot tub until your doctor says it is okay.  You may shower 24-48 hours after the procedure or as told by your doctor. ? Gently wash the area with plain soap and water. ? Pat the area dry with a clean towel. ? Do not rub the area. This may cause bleeding.  Do not apply powder or lotion to the area. Keep the area clean and dry.  Check your insertion area every day for signs of infection. Check for: ? More redness, swelling, or pain. ? Fluid or blood. ?   Warmth. ? Pus or a bad smell. Activity  Rest as told by your doctor, usually for 1-2 days.  Do not lift anything that is heavier than 10 lbs. (4.5 kg) or as told by your doctor.  Do not drive for 24 hours if you were given a medicine to help you relax (sedative).  Do not drive or use heavy machinery while taking prescription pain medicine. General instructions   Go back to your normal activities as told by your  doctor, usually in about a week. Ask your doctor what activities are safe for you.  If the insertion area starts to bleed, lie flat and put pressure on the area. If the bleeding does not stop, get help right away. This is an emergency.  Drink enough fluid to keep your pee (urine) clear or pale yellow.  Take over-the-counter and prescription medicines only as told by your doctor.  Keep all follow-up visits as told by your doctor. This is important. Contact a doctor if:  You have a fever.  You have chills.  You have more redness, swelling, or pain around your insertion area.  You have fluid or blood coming from your insertion area.  The insertion area feels warm to the touch.  You have pus or a bad smell coming from your insertion area.  You have more bruising around the insertion area.  Blood collects in the tissue around the insertion area (hematoma) that may be painful to the touch. Get help right away if:  You have a lot of pain in the insertion area.  The insertion area swells very fast.  The insertion area is bleeding, and the bleeding does not stop after holding steady pressure on the area.  The area near or just beyond the insertion area becomes pale, cool, tingly, or numb. These symptoms may be an emergency. Do not wait to see if the symptoms will go away. Get medical help right away. Call your local emergency services (911 in the [place] Do not drive yourself to the hospital. Summary  After the procedure, it is common to have bruising and tenderness at the long, thin tube insertion area.  After the procedure, it is important to rest and drink plenty of fluids.  Do not take baths, swim, or use a hot tub until your doctor says it is okay to do so. You may shower 24-48 hours after the procedure or as told by your doctor.  If the insertion area starts to bleed, lie flat and put pressure on the area. If the bleeding does not stop, get help right away. This is an  emergency. This information is not intended to replace advice given to you by your health care provider. Make sure you discuss any questions you have with your health care provider. Document Revised: 08/11/2017 Document Reviewed: 08/23/2016 Elsevier Patient Education  2020 Else[employer] Inc.  Femoral Site Care This sheet gives you information about how to care for yourself after your procedure. Your health care provider may also give you more specific instructions. If you have problems or questions, contact your health care provider. What can I expect after the procedure? After the procedure, it is common to have:  Bruising that usually fades within 1-2 weeks.  Tenderness at the site. Follow these instructions at home: Wound care  Follow instructions from your health care provider about how to take care of your insertion site. Make sure you: ? Wash your hands with soap and water before you change your bandage (dressing). If   soap and water are not available, use hand sanitizer. ? Change your dressing as told by your health care provider. ? Leave stitches (sutures), skin glue, or adhesive strips in place. These skin closures may need to stay in place for 2 weeks or longer. If adhesive strip edges start to loosen and curl up, you may trim the loose edges. Do not remove adhesive strips completely unless your health care provider tells you to do that.  Do not take baths, swim, or use a hot tub until your health care provider approves.  You may shower 24-48 hours after the procedure or as told by your health care provider. ? Gently wash the site with plain soap and water. ? Pat the area dry with a clean towel. ? Do not rub the site. This may cause bleeding.  Do not apply powder or lotion to the site. Keep the site clean and dry.  Check your femoral site every day for signs of infection. Check for: ? Redness, swelling, or pain. ? Fluid or blood. ? Warmth. ? Pus or a bad smell. Activity  For  the first 2-3 days after your procedure, or as long as directed: ? Avoid climbing stairs as much as possible. ? Do not squat.  Do not lift anything that is heavier than 10 lb (4.5 kg), or the limit that you are told, until your health care provider says that it is safe.  Rest as directed. ? Avoid sitting for a long time without moving. Get up to take short walks every 1-2 hours.  Do not drive for 24 hours if you were given a medicine to help you relax (sedative). General instructions  Take over-the-counter and prescription medicines only as told by your health care provider.  Keep all follow-up visits as told by your health care provider. This is important. Contact a health care provider if you have:  A fever or chills.  You have redness, swelling, or pain around your insertion site. Get help right away if:  The catheter insertion area swells very fast.  You pass out.  You suddenly start to sweat or your skin gets clammy.  The catheter insertion area is bleeding, and the bleeding does not stop when you hold steady pressure on the area.  The area near or just beyond the catheter insertion site becomes pale, cool, tingly, or numb. These symptoms may represent a serious problem that is an emergency. Do not wait to see if the symptoms will go away. Get medical help right away. Call your local emergency services (911 in the [place] Do not drive yourself to the hospital. Summary  After the procedure, it is common to have bruising that usually fades within 1-2 weeks.  Check your femoral site every day for signs of infection.  Do not lift anything that is heavier than 10 lb (4.5 kg), or the limit that you are told, until your health care provider says that it is safe. This information is not intended to replace advice given to you by your health care provider. Make sure you discuss any questions you have with your health care provider. Document Revised: 09/11/2017 Document Reviewed:  09/11/2017 Elsevier Patient Education  2020 Elsevier Inc.  

## 2019-11-04 NOTE — H&P (Signed)
 [facility] History & Physical Update  The patient was interviewed and re-examined.  The patient's previous History and Physical has been reviewed and is unchanged.  There is no change in the plan of care. We plan to proceed with the scheduled procedure.  [name], MD  11/04/2019, 8:06 AM

## 2019-11-04 NOTE — Op Note (Signed)
[facility] Percutaneous Study/Intervention Procedural Note   Date: 11/04/2019  Surgeon(s): [name], MD  Assistants: none  Pre-operative Diagnosis: 1.  Abdominal pain worrisome for chronic mesenteric ischemia 2.  Previous SMA stent, concern for recurrent stenosis   Post-operative diagnosis: Same  Procedure(s) Performed: 1. Ultrasound guidance for vascular access right femoral artery 2. Catheter placement into celiac artery and into the superior mesenteric artery from right femoral approach 3. Aortogram and selective angiogram of the celiac and superior mesenteric artery 4. PTA of the celiac artery with 6 mm diameter Lutonix drug-coated angioplasty balloon 5. StarClose closure device right femoral artery  Contrast: 80 cc  Fluoro time: 5.6 minutes  EBL: 5 cc  Anesthesia: Approximately 30 minutes of Moderate conscious sedation using 2 mg of Versed and 50 mcg of Fentanyl  Indications: Patient is a 74 y.o. male who has symptoms consistent with mesenteric ischemia. The patient has a duplex showing possible restenosis of his SMA stent that was placed last year. The patient is brought in for angiography for further evaluation and potential treatment. Risks and benefits are discussed and informed consent is obtained  Procedure: The patient was identified and appropriate procedural time out was performed. The patient was then placed supine on the table and prepped and draped in the usual sterile fashion. Moderate conscious sedation was administered during a face to face encounter with the patient throughout the procedure with my supervision of the RN administering medicines and monitoring the patient's vital signs, pulse oximetry, telemetry and mental status throughout from the start of the procedure until the patient was taken to the recovery room. Ultrasound was used to evaluate the  right common femoral artery. It was patent . A digital ultrasound image was acquired. A Seldinger needle was used to access the right common femoral artery under direct ultrasound guidance and a permanent image was performed. A 0.035 J wire was advanced without resistance and a 5Fr sheath was placed. Pigtail catheter was placed into the aorta and an AP aortogram was performed. This demonstrated good flow through the renal arteries with no stenosis in the renal arteries, aorta, or proximal iliac arteries.  Flow could be seen out beyond the proximal portions of the celiac artery and SMA, but obviously in the AP projection the origins could not be well seen. We transitioned to the lateral projection to image the celiac and SMA. 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  There is flow in the SMA stent although it was difficult to discern if there may be a proximal stenosis. The patient was given 5000 units of IV heparin.  A V S1 catheter was used to selectively cannulate the SMA and the previously placed stent.  Selective imaging showed essentially no recurrent stenosis within the SMA stent that was in excellent location with brisk flow.  I then used a V S1 catheter to selectively cannulate the celiac artery. This was difficult to opacify the very origin of the celiac artery and see the stenosis as well as we did on the aortogram, but there did appear to be poststenotic dilatation I felt this may be a real stenosis although it was difficult to opacify.  Based on his symptoms and these findings, I elected to treat the celiac artery to try to improve the patient's clinical course. I crossed the lesion without difficulty with the V  S1 catheter and Magic torque wire. I then used a 6 mm diameter x 40 mm length angioplasty balloon to perform percutaneous transluminal angioplasty of  the celiac artery. I inflated the balloon to 10 atm and held the inflation for 1 minute. On completion angiogram following this angioplasty, a roughly 30% residual stenosis was identified.  I did not feel it was worth placing a stent at this point with mild residual stenosis. At this point, I elected to terminate the procedure. The diagnostic catheter was removed. StarClose closure device was deployed in usual fashion with excellent hemostatic result. The patient was taken to the recovery room in stable condition having tolerated the procedure well.     Findings:Previously placed SMA stent had brisk flow in both on aortogram and selective image with a V S1 catheter was widely patent.  The celiac artery had some degree of stenosis at the origin although it was difficult to opacify due to poor images.  The images were better than on her previous C arm and a narrowing was seen today that had not been previously seen.  This appeared to be moderate at worst in the 60% range.  Disposition: Patient was taken to the recovery room in stable condition having tolerated the procedure well.  Complications: None  [name] 11/04/2019 9:55 AM   This note was created with Dragon Medical transcription system. Any errors in dictation are purely unintentional.

## 2019-11-07 ENCOUNTER — Other Ambulatory Visit: Payer: Self-pay | Admitting: *Deleted

## 2019-11-07 DIAGNOSIS — I509 Heart failure, unspecified: Secondary | ICD-10-CM | POA: Diagnosis not present

## 2019-11-07 DIAGNOSIS — I10 Essential (primary) hypertension: Secondary | ICD-10-CM | POA: Diagnosis not present

## 2019-11-07 DIAGNOSIS — R0602 Shortness of breath: Secondary | ICD-10-CM | POA: Diagnosis not present

## 2019-11-07 DIAGNOSIS — I2510 Atherosclerotic heart disease of native coronary artery without angina pectoris: Secondary | ICD-10-CM | POA: Diagnosis not present

## 2019-11-07 DIAGNOSIS — E669 Obesity, unspecified: Secondary | ICD-10-CM | POA: Diagnosis not present

## 2019-11-07 DIAGNOSIS — E782 Mixed hyperlipidemia: Secondary | ICD-10-CM | POA: Diagnosis not present

## 2019-11-07 DIAGNOSIS — Z9861 Coronary angioplasty status: Secondary | ICD-10-CM | POA: Diagnosis not present

## 2019-11-07 NOTE — Patient Outreach (Signed)
[place] [facility]) Care Management  [place]  11/07/2019   HEBER BRADFIELD Aug 07, 1946   [id number]   Telephone assessment   Recent Hospital Admissions 1/19-1/23/21 for Chest Pain, PCI stents to Mid and distal LAD, anemia.  1/31-10/14/19 for Acute Renal Failure, pseudoaneurysm right femoral artery.   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 Chronic mesenteric ischemia.Falls  11/04/19 Aortogram and selective angiogram of celiac and superior mesentric artery. Angioplasty of celiac artery .   Subjective:  Successful outreach call to patient , reports that he is doing just fine. He discussed recent procedure went well and stent placed. He denies any concern at site, no bleeding or increased bruising or discomfort.  He states no further abdominal pain , tolerating meals.  He discussed follow up with orthopedic regarding right knee discomfort , no new problems noted states he is begin physical therapy outpatient to build up strength in his thigh muscle . Mr Thilges state had did have a 2 days ago as coming down stairs says he started with the wrong foot, denies injury. He reports tolerating increased mobility over the last 2 days, walking outside on level ground at home.  Patient discussed looking forward to being able to return to work.     Encounter Medications:  Outpatient Encounter Medications as of 11/07/2019  Medication Sig  . ACCU-CHEK AVIVA PLUS test strip Use to test blood sugar twice daily  . Accu-Chek Softclix Lancets lancets Use to test blood sugar twice daily  . acetaminophen (TYLENOL) 325 MG tablet Take 2 tablets (650 mg total) by mouth every 6 (six) hours as needed for mild pain (or Fever >/= 101).  Marland Kitchen aspirin 81 MG tablet Take 1 tablet (81 mg total) by mouth daily.  Marland Kitchen atorvastatin (LIPITOR) 80 MG tablet Take 80 mg by mouth at bedtime.  .  clonazePAM (KLONOPIN) 1 MG tablet Take 0.5 mg by mouth at bedtime as needed for anxiety.   . clopidogrel (PLAVIX) 75 MG tablet Take 1 tablet (75 mg total) by mouth daily.  Marland Kitchen gabapentin (NEURONTIN) 400 MG capsule Take 800 mg by mouth at bedtime.   . insulin detemir (LEVEMIR) 100 UNIT/ML injection Inject 15 Units into the skin daily.  . iron polysaccharides (NIFEREX) 150 MG capsule Take 150 mg by mouth daily.  Marland Kitchen lisinopril (ZESTRIL) 20 MG tablet Take 20 mg by mouth daily.  . metFORMIN (GLUCOPHAGE-XR) 500 MG 24 hr tablet Take 500 mg by mouth 2 (two) times daily with a meal. 2 tablets twice daily with meals  . metoprolol tartrate (LOPRESSOR) 25 MG tablet Take 0.5 tablets (12.5 mg total) by mouth 2 (two) times daily. (Patient taking differently: Take 25 mg by mouth at bedtime. )  . Omega-3 Fatty Acids (FISH OIL) 1000 MG CAPS Take 1,000 mg by mouth 2 (two) times daily.   Marland Kitchen OZEMPIC, 0.25 OR 0.5 MG/DOSE, 2 MG/1.5ML SOPN Inject 1 Dose into the skin once a week. Wednesdays  . pantoprazole (PROTONIX) 40 MG tablet Take 40 mg by mouth daily.  Marland Kitchen PARoxetine (PAXIL) 40 MG tablet Take 40 mg by mouth daily.   . pioglitazone (ACTOS) 15 MG tablet Take 15 mg by mouth daily.   . ranolazine (RANEXA) 500 MG 12 hr tablet Take 1 tablet by mouth 2 (two) times daily.  . vitamin B-12 (CYANOCOBALAMIN) 1000 MCG tablet Take 1,000 mcg by mouth daily.   No facility-administered encounter medications on file as of  11/07/2019.    Functional Status:  In your present state of health, do you have any difficulty performing the following activities: 11/04/2019 10/16/2019  Hearing? Y Y  Comment - wears hearing aide  Vision? N N  Difficulty concentrating or making decisions? N N  Walking or climbing stairs? Y Y  Comment - fall risk, uses cane/walker  Dressing or bathing? N N  Doing errands, shopping? - Scientist, forensic and eating ? - N  Using the Toilet? - N  In the past six months, have you accidently leaked urine? - N  Do you  have problems with loss of bowel control? - N  Managing your Medications? - N  Managing your Finances? - N  Housekeeping or managing your Housekeeping? - Y  Comment - wife assist  Some recent data might be hidden    Fall/Depression Screening: Fall Risk  10/11/2019  Falls in the past year? 1  Number falls in past yr: 1  Injury with Fall? 0  Risk for fall due to : History of fall(s);Impaired balance/gait  Follow up Falls prevention discussed   PHQ 2/9 Scores 10/11/2019  PHQ - 2 Score 0    Assessment:  Chest Pain: Denies recurrent chest pain or shortness of breath as cardiology visit on today.  High fall risk/right knee and right foot with  5th metatarsal fracture  - continued education on fall prevention.to begin outpatient physical therapy.  Diabetes: he reports improved cbg readings on low blood sugar episodes. Tolerating diet usual eating pattern of 2 meals a day reinforced balanced meals for glucose control .Denies recent weight loss , reports weights staying at 186 lbs Hypertension : Continues monitor readings, 130-140/60's, reports having all medications and  taking medications as prescribed.  Patient denies any other concerns at this time.   Plan:  Will plan return call in the next month for follow up on progress and continue education and support .   [facility] CM Care Plan Problem One     Most Recent Value  Care Plan Problem One  Patient with risk for readmission related to recent hospital admission for chest pain/stent placement   Role Documenting the Problem One  Care Management [place] for Problem One  Active  [facility] Long Term Goal   Patient will not experience a hospital admission over the next 60 days  [goal extended ]  [facility]N Long Term Goal Start Date  10/16/19  Interventions for Problem One Long Term Goal  Discussed current clinical state after procedure, reinforced notifying MD of and new worsening symptoms of pain , bleeding , signs of infection at  groin site .   THN CM Short Term Goal #1   Over the next 30 days patient will report attending all medical appointments  [facility] CM Short Term Goal #1 Start Date  10/16/19  Interventions for Short Term Goal #1  Discussed upcoming appointments with cardiology, GI   THN CM Short Term Goal #2   Patient will be able to reports monitoring blood pressure dailly over the next 30 days and keeping record.   THN CM Short Term Goal #2 Start Date  10/16/19  Interventions for Short Term Goal #2  Reviewed recent readings, verified keeping a record. Discussed normal ranges and when to notify MD .  Discussed measures for managing blood pressure , activity as tolerated , limiting salt in food, processed foods.     [facility] CM Care Plan Problem Two     Most Recent  Value  Care Plan Problem Two  Patient high risk for falls as evidenced by report of falls occurences   Role Documenting the Problem Two  Care Management [place] for Problem Two  Active  Interventions for Problem Two Long Term Goal   Reinforced benefits of attending physical therapy to help strenghten muscles , help with balance to prevent falls.   THN Long Term Goal  Over the next 60 days patient will be able to identify at least 2  measures to prevent falls  [goal adjusted ]  THN Long Term Goal Start Date  10/16/19  [facility]N CM Short Term Goal #1   Over the next 30 days patient will be able to report decrease in fall occurences as evidenced by report .   THN CM Short Term Goal #1 Start Date  10/16/19  Interventions for Short Term Goal #2   Reviewed recent fall status, review of fall prevention handout, encouraged to read. Reinforced using walker or cane, changing positions slowly, reviewed when doing stairs up with good down with bad.   THN CM Short Term Goal #2   Over the next 30 days patient will be able to verbalize healing at right knee and foot area evidenced by patient report.   THN CM Short Term Goal #2 Start Date  10/31/19   Interventions for Short Term Goal #2  Reviewed current status of right knee and foot, improved as evidenced by increased mobiltity      Joylene Draft, RN, BSN  [place] Management Coordinator  [phone]- Mobile [phone]- [place]

## 2019-11-12 DIAGNOSIS — K219 Gastro-esophageal reflux disease without esophagitis: Secondary | ICD-10-CM | POA: Diagnosis not present

## 2019-11-12 DIAGNOSIS — E1169 Type 2 diabetes mellitus with other specified complication: Secondary | ICD-10-CM | POA: Diagnosis not present

## 2019-11-12 DIAGNOSIS — K551 Chronic vascular disorders of intestine: Secondary | ICD-10-CM | POA: Diagnosis not present

## 2019-11-12 DIAGNOSIS — Z7902 Long term (current) use of antithrombotics/antiplatelets: Secondary | ICD-10-CM | POA: Diagnosis not present

## 2019-11-12 DIAGNOSIS — E1142 Type 2 diabetes mellitus with diabetic polyneuropathy: Secondary | ICD-10-CM | POA: Diagnosis not present

## 2019-11-12 DIAGNOSIS — I25118 Atherosclerotic heart disease of native coronary artery with other forms of angina pectoris: Secondary | ICD-10-CM | POA: Diagnosis not present

## 2019-11-12 DIAGNOSIS — I700 Atherosclerosis of aorta: Secondary | ICD-10-CM | POA: Diagnosis not present

## 2019-11-12 DIAGNOSIS — E785 Hyperlipidemia, unspecified: Secondary | ICD-10-CM | POA: Diagnosis not present

## 2019-11-12 DIAGNOSIS — D509 Iron deficiency anemia, unspecified: Secondary | ICD-10-CM | POA: Diagnosis not present

## 2019-11-15 ENCOUNTER — Ambulatory Visit: Payer: Medicare HMO | Attending: Internal Medicine

## 2019-11-15 ENCOUNTER — Ambulatory Visit: Payer: Self-pay | Admitting: Pharmacist

## 2019-11-15 ENCOUNTER — Other Ambulatory Visit: Payer: Self-pay

## 2019-11-15 ENCOUNTER — Other Ambulatory Visit: Payer: Self-pay | Admitting: Pharmacist

## 2019-11-15 DIAGNOSIS — Z23 Encounter for immunization: Secondary | ICD-10-CM

## 2019-11-15 NOTE — Patient Outreach (Signed)
[place] [facility]) Care Management  11/15/2019  Patrick Turner 09-30-1945 [id number]   Patient was called regarding medication adherence and assistance. He answered the phone and provided HIPAA identifiers x2 but said he was just sitting down to eat and wondered if I could call him back later in the day.  Unfortunately, my day was full on onboarding meetings and I was unable to call the patient back.  Plan: Call the patient back to follow on his medication list as there was some confusion during out 10/17/2019 conversation then close his case if no further questions.  Elayne Guerin, PharmD, [place] Clinical Pharmacist [phone]

## 2019-11-15 NOTE — Progress Notes (Signed)
   Covid-19 Vaccination Clinic  Name:  Patrick Turner    MRN: [id number] DOB: 1945/12/04  11/15/2019  Patrick Turner was observed post Covid-19 immunization for 15 minutes without incident. He was provided with Vaccine Information Sheet and instruction to access the V-Safe system.   Patrick Turner was instructed to call 911 with any severe reactions post vaccine: Marland Kitchen Difficulty breathing  . Swelling of face and throat  . A fast heartbeat  . A bad rash all over body  . Dizziness and weakness

## 2019-11-20 ENCOUNTER — Other Ambulatory Visit: Payer: Self-pay | Admitting: *Deleted

## 2019-11-20 NOTE — Patient Outreach (Signed)
[place] [facility]) Care Management  [place]  11/20/2019   Patrick Turner Jan 15, 1946 [id number]   Telephone assessment    Recent Hospital Admissions 1/19-1/23/21 for Chest Pain, PCI stents to Mid and distal LAD, anemia.  1/31-10/14/19 for Acute Renal Failure, pseudoaneurysm right femoral artery.   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 Chronic mesenteric ischemia. Frequent Falls . peripheral neuropathy, 5th metatarsal fracture, Iron deficiency anemia.  11/04/19 Aortogram and selective angiogram of celiac and superior mesentric artery. Angioplasty of celiac artery .   Subjective:  Unsuccessful outreach call to patient , no answer able to leave a HIPAA compliant message for return call.  1415  Return call from patient states that he is doing good. He discussed that he started back to work on last week, reports tolerating well and glad to be back at work. He denies having fall since last call outreach just a stumble did not fall.  He denies chest pain or shortness of breath.  Patient denies having abdominal discomfort since recent angiogram, reports appetite and intake about the same no weight loss, recent weight 190. He reports awaiting call from [place] office regarding scheduling capsule study after visit on last week.    Encounter Medications:  Outpatient Encounter Medications as of 11/20/2019  Medication Sig  . ACCU-CHEK AVIVA PLUS test strip Use to test blood sugar twice daily  . Accu-Chek Softclix Lancets lancets Use to test blood sugar twice daily  . acetaminophen (TYLENOL) 325 MG tablet Take 2 tablets (650 mg total) by mouth every 6 (six) hours as needed for mild pain (or Fever >/= 101).  Marland Kitchen aspirin 81 MG tablet Take 1 tablet (81 mg total) by mouth daily.  Marland Kitchen atorvastatin (LIPITOR) 80 MG tablet Take 80 mg by mouth at bedtime.  .  clonazePAM (KLONOPIN) 1 MG tablet Take 0.5 mg by mouth at bedtime as needed for anxiety.   . clopidogrel (PLAVIX) 75 MG tablet Take 1 tablet (75 mg total) by mouth daily.  Marland Kitchen gabapentin (NEURONTIN) 400 MG capsule Take 800 mg by mouth at bedtime.   . insulin detemir (LEVEMIR) 100 UNIT/ML injection Inject 15 Units into the skin daily.  . iron polysaccharides (NIFEREX) 150 MG capsule Take 150 mg by mouth daily.  Marland Kitchen lisinopril (ZESTRIL) 20 MG tablet Take 20 mg by mouth daily.  . metFORMIN (GLUCOPHAGE-XR) 500 MG 24 hr tablet Take 500 mg by mouth 2 (two) times daily with a meal. 2 tablets twice daily with meals  . metoprolol tartrate (LOPRESSOR) 25 MG tablet Take 0.5 tablets (12.5 mg total) by mouth 2 (two) times daily. (Patient taking differently: Take 25 mg by mouth at bedtime. )  . Omega-3 Fatty Acids (FISH OIL) 1000 MG CAPS Take 1,000 mg by mouth 2 (two) times daily.   Marland Kitchen OZEMPIC, 0.25 OR 0.5 MG/DOSE, 2 MG/1.5ML SOPN Inject 1 Dose into the skin once a week. Wednesdays  . pantoprazole (PROTONIX) 40 MG tablet Take 40 mg by mouth daily.  Marland Kitchen PARoxetine (PAXIL) 40 MG tablet Take 40 mg by mouth daily.   . pioglitazone (ACTOS) 15 MG tablet Take 15 mg by mouth daily.   . ranolazine (RANEXA) 500 MG 12 hr tablet Take 1 tablet by mouth 2 (two) times daily.  . vitamin B-12 (CYANOCOBALAMIN) 1000 MCG tablet Take 1,000 mcg by mouth daily.   No facility-administered encounter medications on file as of 11/20/2019.    Functional Status:  In your present state of health, do you have any difficulty performing the following activities: 11/04/2019 10/16/2019  Hearing? Y Y  Comment - wears hearing aide  Vision? N N  Difficulty concentrating or making decisions? N N  Walking or climbing stairs? Y Y  Comment - fall risk, uses cane/walker  Dressing or bathing? N N  Doing errands, shopping? - Scientist, forensic and eating ? - N  Using the Toilet? - N  In the past six months, have you accidently leaked urine? - N  Do you  have problems with loss of bowel control? - N  Managing your Medications? - N  Managing your Finances? - N  Housekeeping or managing your Housekeeping? - Y  Comment - wife assist  Some recent data might be hidden    Fall/Depression Screening: Fall Risk  10/11/2019  Falls in the past year? 1  Number falls in past yr: 1  Injury with Fall? 0  Risk for fall due to : History of fall(s);Impaired balance/gait  Follow up Falls prevention discussed   PHQ 2/9 Scores 10/11/2019  PHQ - 2 Score 0    Assessment:   Chest Pain - no recurrent chest pain, blood pressure reading range 140-150/70's. Denies any problems with taking medications.  High fall risk- initial physical therapy visit on  3/11.  Diabetes- reports recent am before meal blood sugar of 68, denies having low blood sugar symptoms. Reviewed rule of 15 for treating low blood sugar less than 70. For recheck A1c in April . Reinforced balanced meals .  Appointments: PT/OT initial office visit on 3/11 Podiatry office visit 11/21/19 Dr. Manuella Ghazi office visit 3/17, diabetic retinopathy .    Plan:  Will plan return call in the next 3 weeks.  Reinforced with patient to notify MD sooner of new or worsening symptoms.  Fall precautions reviewed.   THN CM Care Plan Problem One     Most Recent Value  Care Plan Problem One  Patient with risk for readmission related to recent hospital admission for chest pain/stent placement   Role Documenting the Problem One  Care Management [place] for Problem One  Active  [facility] Long Term Goal   Patient will not experience a hospital admission over the next 60 days   [facility]N Long Term Goal Start Date  10/16/19  Interventions for Problem One Long Term Goal  Reinforced continuing to notify MD of new worsening concerns, keeping all appointments,and taking medications as prescribed and seeking medical attention for questions.   [facility]N CM Short Term Goal #1   Over the next 30 days patient will report  attending all medical appointments  [facility] CM Short Term Goal #1 Start Date  10/16/19  [facility] CM Short Term Goal #1 Met Date  11/20/19  THN CM Short Term Goal #2   Patient will be able to reports monitoring blood pressure dailly over the next 30 days and keeping record.   THN CM Short Term Goal #2 Start Date  11/20/19 Billy Fischer goal ]  Interventions for Short Term Goal #2  reviewed recent reinforced continuing to montior readings , keeping record take to next PCP, endocrinology visit, reviewed normal reading range .     [facility]N CM Care Plan Problem Two     Most Recent Value  Care Plan Problem Two  Patient high risk for falls as evidenced by report of falls occurences   Role Documenting the Problem Two  Care Management [place]  for Problem Two  Active  Interventions for Problem Two Long Term Goal   Discussed current condition related to fall , reoccurence and notifying MD of new falls with concern for injury.   THN Long Term Goal  Over the next 60 days patient will be able to identify at least 2  measures to prevent falls   THN Long Term Goal Start Date  10/16/19  [facility] CM Short Term Goal #1   Over the next 30 days patient will be able to report decrease in fall occurences as evidenced by report .   THN CM Short Term Goal #1 Start Date  11/20/19 [goal extended ]  Interventions for Short Term Goal #2   Reinforced wearing comfortable supportive shoes, adhering to recommendation of ortho and podiatry . stressed using cane/walker for support, participation in therapy to help with balance.   [facility]N CM Short Term Goal #2   Over the next 30 days patient will be able to verbalize healing at right knee and foot area evidenced by patient report.   [facility]N CM Short Term Goal #2 Start Date  10/31/19  Interventions for Short Term Goal #2  Discussed upcoming podiatry visit, wearing  fracture boot for 5th metatarsal fracture as recommended/tolerated       Joylene Draft, RN, BSN  [place] Management Coordinator  [phone]- Mobile [phone]- [place]

## 2019-11-21 ENCOUNTER — Other Ambulatory Visit: Payer: Self-pay | Admitting: Pharmacist

## 2019-11-21 ENCOUNTER — Ambulatory Visit: Payer: Self-pay | Admitting: Pharmacist

## 2019-11-21 DIAGNOSIS — R531 Weakness: Secondary | ICD-10-CM | POA: Diagnosis not present

## 2019-11-21 DIAGNOSIS — S92354D Nondisplaced fracture of fifth metatarsal bone, right foot, subsequent encounter for fracture with routine healing: Secondary | ICD-10-CM | POA: Diagnosis not present

## 2019-11-21 NOTE — Patient Outreach (Signed)
[place] [facility]) Care Management  11/21/2019  Patrick Turner [birth year] [id number]   Patient was called regarding medication assistance and review. Unfortunately, he did not answer the phone. HIPAA compliant message was left on the patient's voicemails at home and mobile.  Today's calls were the third set of unsuccessful outreaches.   Plan: Close patient's case due to inability to contact.  Elayne Guerin, PharmD, [place] Clinical Pharmacist [phone]

## 2019-11-27 DIAGNOSIS — Z961 Presence of intraocular lens: Secondary | ICD-10-CM | POA: Diagnosis not present

## 2019-11-27 DIAGNOSIS — E113313 Type 2 diabetes mellitus with moderate nonproliferative diabetic retinopathy with macular edema, bilateral: Secondary | ICD-10-CM | POA: Diagnosis not present

## 2019-11-28 DIAGNOSIS — R531 Weakness: Secondary | ICD-10-CM | POA: Diagnosis not present

## 2019-12-03 ENCOUNTER — Encounter (INDEPENDENT_AMBULATORY_CARE_PROVIDER_SITE_OTHER): Payer: Self-pay | Admitting: Vascular Surgery

## 2019-12-03 ENCOUNTER — Ambulatory Visit (INDEPENDENT_AMBULATORY_CARE_PROVIDER_SITE_OTHER): Payer: Medicare HMO | Admitting: Vascular Surgery

## 2019-12-03 ENCOUNTER — Other Ambulatory Visit: Payer: Self-pay

## 2019-12-03 VITALS — BP 152/66 | HR 62 | Ht 65.0 in | Wt 188.0 lb

## 2019-12-03 DIAGNOSIS — E1142 Type 2 diabetes mellitus with diabetic polyneuropathy: Secondary | ICD-10-CM

## 2019-12-03 DIAGNOSIS — K551 Chronic vascular disorders of intestine: Secondary | ICD-10-CM | POA: Diagnosis not present

## 2019-12-03 DIAGNOSIS — R531 Weakness: Secondary | ICD-10-CM | POA: Diagnosis not present

## 2019-12-03 DIAGNOSIS — I10 Essential (primary) hypertension: Secondary | ICD-10-CM | POA: Diagnosis not present

## 2019-12-03 NOTE — Assessment & Plan Note (Signed)
Doing well after celiac angioplasty a month ago and a previously placed SMA stent.  Symptoms markedly improved.  No change in medical regimen at this time.  Plan to check in 2 to 3 months with noninvasive studies.

## 2019-12-03 NOTE — Progress Notes (Signed)
MRN : [id number]  Patrick Turner is a 74 y.o. (September 09, 1946) male who presents with chief complaint of  Chief Complaint  Patient presents with  . Follow-up    4 wk armc post angio; no studies   .  History of Present Illness: Patient returns today in follow up of his chronic mesenteric ischemic symptoms about 1 month status post celiac angioplasty.  His previously placed SMA stent was widely patent at his last angiogram.  Since the procedure, he has done quite well.  He says within a few days after the procedure his abdominal pain was gone and he is having no postprandial abdominal pain or weight loss.  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  No periprocedural complications.  His access site is well-healed.  Current Outpatient Medications  Medication Sig Dispense Refill  . ACCU-CHEK AVIVA PLUS test strip Use to test blood sugar twice daily    . Accu-Chek Softclix Lancets lancets Use to test blood sugar twice daily    . acetaminophen (TYLENOL) 325 MG tablet Take 2 tablets (650 mg total) by mouth every 6 (six) hours as needed for mild pain (or Fever >/= 101). 50 tablet 0  . aspirin 81 MG tablet Take 1 tablet (81 mg total) by mouth daily.    Marland Kitchen atorvastatin (LIPITOR) 80 MG tablet Take 80 mg by mouth at bedtime.    . clonazePAM (KLONOPIN) 1 MG tablet Take 0.5 mg by mouth at bedtime as needed for anxiety.   2  . clopidogrel (PLAVIX) 75 MG tablet Take 1 tablet (75 mg total) by mouth daily. 30 tablet 11  . gabapentin (NEURONTIN) 400 MG capsule Take 800 mg by mouth at bedtime.     . insulin detemir (LEVEMIR) 100 UNIT/ML injection Inject 15 Units into the skin daily.    . iron polysaccharides (NIFEREX) 150 MG capsule Take 150 mg by mouth daily.    Marland Kitchen lisinopril (ZESTRIL) 20 MG tablet Take 20 mg by mouth daily.    . metFORMIN (GLUCOPHAGE-XR) 500 MG 24 hr tablet Take 500 mg by mouth 2 (two) times daily with a meal. 2 tablets twice daily with meals    . metoprolol  tartrate (LOPRESSOR) 25 MG tablet Take 0.5 tablets (12.5 mg total) by mouth 2 (two) times daily. (Patient taking differently: Take 25 mg by mouth at bedtime. ) 60 tablet 0  . Omega-3 Fatty Acids (FISH OIL) 1000 MG CAPS Take 1,000 mg by mouth 2 (two) times daily.     Marland Kitchen OZEMPIC, 0.25 OR 0.5 MG/DOSE, 2 MG/1.5ML SOPN Inject 1 Dose into the skin once a week. Wednesdays    . pantoprazole (PROTONIX) 40 MG tablet Take 40 mg by mouth daily.    Marland Kitchen PARoxetine (PAXIL) 40 MG tablet Take 40 mg by mouth daily.     . pioglitazone (ACTOS) 15 MG tablet Take 15 mg by mouth daily.     . ranolazine (RANEXA) 500 MG 12 hr tablet Take 1 tablet by mouth 2 (two) times daily.    . vitamin B-12 (CYANOCOBALAMIN) 1000 MCG tablet Take 1,000 mcg by mouth daily.     No current facility-administered medications for this visit.    Past Medical History:  Diagnosis Date  . Altered mental status   . Anemia   . Anxiety   . Aortic atherosclerosis ([place])   . Arthritis   . Coronary artery disease   . CRD (chronic renal disease)   . Depression   .  Diabetes mellitus without complication ([place])   . Encephalopathy acute   . GERD (gastroesophageal reflux disease)   . Headache   . Hypercholesteremia   . Hypertension   . Neuropathy   . Severe obesity ([place])   . Sleep apnea   . Sleep terror    per patient this year per patient     Past Surgical History:  Procedure Laterality Date  . APPENDECTOMY    . CIRCUMCISION    . COLONOSCOPY    . COLONOSCOPY WITH PROPOFOL N/A 07/10/2018   Procedure: COLONOSCOPY WITH PROPOFOL;  Surgeon: Lollie Sails, MD;  Location: [facility] ENDOSCOPY;  Service: Endoscopy;  Laterality: N/A;  . COLONOSCOPY WITH PROPOFOL N/A 04/23/2019   Procedure: COLONOSCOPY WITH PROPOFOL;  Surgeon: Lollie Sails, MD;  Location: [facility] ENDOSCOPY;  Service: Endoscopy;  Laterality: N/A;  . CORONARY STENT INTERVENTION N/A 10/03/2019   Procedure: CORONARY STENT INTERVENTION;  Surgeon: Yolonda Kida, MD;  Location:  [place] CV LAB;  Service: Cardiovascular;  Laterality: N/A;  . ESOPHAGOGASTRODUODENOSCOPY    . ESOPHAGOGASTRODUODENOSCOPY (EGD) WITH PROPOFOL N/A 04/23/2019   Procedure: ESOPHAGOGASTRODUODENOSCOPY (EGD) WITH PROPOFOL;  Surgeon: Lollie Sails, MD;  Location: [facility] ENDOSCOPY;  Service: Endoscopy;  Laterality: N/A;  . JOINT REPLACEMENT    . 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 KNEE ARTHROSCOPY Right   . LEFT HEART CATH AND CORONARY ANGIOGRAPHY N/A 10/03/2019   Procedure: Left Heart Cath and possible Coronary intervention;  Surgeon: Dionisio David, MD;  Location: [place] CV LAB;  Service: Cardiovascular;  Laterality: N/A;  . TONSILLECTOMY    . VISCERAL ANGIOGRAPHY N/A 05/23/2019   Procedure: VISCERAL ANGIOGRAPHY;  Surgeon: Algernon Huxley, MD;  Location: [place] CV LAB;  Service: Cardiovascular;  Laterality: N/A;  . VISCERAL ANGIOGRAPHY N/A 11/04/2019   Procedure: VISCERAL ANGIOGRAPHY;  Surgeon: Algernon Huxley, MD;  Location: [place] CV LAB;  Service: Cardiovascular;  Laterality: N/A;     Social History   Tobacco Use  . Smoking status: Never Smoker  . Smokeless tobacco: Never Used  Substance Use Topics  . Alcohol use: Yes    Comment: rare  . Drug use: No    Family History  Problem Relation Age of Onset  . Diabetes Mother   . Heart disease Mother   . Cancer Father   . COPD Father      Allergies  Allergen Reactions  . Penicillins Anaphylaxis    Has patient had a PCN reaction causing immediate rash, facial/tongue/throat swelling, SOB or lightheadedness with hypotension: Yes Has patient had a PCN reaction causing severe rash involving mucus membranes or skin necrosis: No Has patient had a PCN reaction that required hospitalization Yes Has patient had a PCN reaction occurring within the last 10 years: No If all  of the above answers are "NO", then may proceed with Cephalosporin use.  . Tavist [Clemastine] Swelling    REVIEW OF SYSTEMS(Negative unless checked)  Constitutional: [x] ??Weight loss [] ??Fever [] ??Chills Cardiac: [] ??Chest pain [] ??Chest pressure [] ??Palpitations [] ??Shortness of breath when laying flat [] ??Shortness of breath at rest [] ??Shortness of breath with exertion. Vascular: [] ??Pain in legs with walking [] ??Pain in legs at rest [] ??Pain in legs when laying flat [] ??Claudication [] ??Pain in feet when walking [] ??Pain in feet at rest [] ??Pain in feet when laying flat [] ??History of DVT [] ??Phlebitis [] ??Swelling in legs [] ??Varicose veins [] ??Non-healing ulcers Pulmonary: [] ??Uses  home oxygen [] ??Productive cough [] ??Hemoptysis [] ??Wheeze [] ??COPD [] ??Asthma Neurologic: [] ??Dizziness [] ??Blackouts [] ??Seizures [] ??History of stroke [] ??History of TIA [] ??Aphasia [] ??Temporary blindness [] ??Dysphagia [] ??Weakness or numbness in arms [] ??Weakness or numbness in legs Musculoskeletal: [x] ??Arthritis [] ??Joint swelling [x] ??Joint pain [] ??Low back pain Hematologic: [] ??Easy bruising [] ??Easy bleeding [] ??Hypercoagulable state [] ??Anemic  Gastrointestinal: [] ??Blood in stool [] ??Vomiting blood [x] ??Gastroesophageal reflux/heartburn [x] ??Abdominal pain Genitourinary: [] ??Chronic kidney disease [] ??Difficult urination [] ??Frequent urination [] ??Burning with urination [] ??Hematuria Skin: [] ??Rashes [] ??Ulcers [] ??Wounds Psychological: [] ??History of anxiety [] ??History of major depression.  Physical Examination  BP (!) 152/66 (BP Location: Left Arm)   Pulse 62   Ht 5\' 5"  (1.651 m)   Wt 188 lb (85.3 kg)   SpO2 (!) 16%   BMI 31.28 kg/m  Gen:  WD/WN, NAD Head: [place]/AT, No temporalis wasting. Ear/Nose/Throat: Hearing grossly intact, nares w/o erythema or drainage Eyes: Conjunctiva clear. Sclera  non-icteric Neck: Supple.  Trachea midline Pulmonary:  Good air movement, no use of accessory muscles.  Cardiac: RRR, no JVD Vascular:  Vessel Right Left  Radial Palpable Palpable                                   Gastrointestinal: soft, non-tender/non-distended. No guarding/reflex.  Musculoskeletal: M/S 5/5 throughout.  No deformity or atrophy. Mild LE edema. Neurologic: Sensation grossly intact in extremities.  Symmetrical.  Speech is fluent.  Psychiatric: Judgment intact, Mood & affect appropriate for pt's clinical situation. Dermatologic: No rashes or ulcers noted.  No cellulitis or open wounds.       Labs Recent Results (from the past 2160 hour(s))  Basic metabolic panel     Status: Abnormal   Collection Time: 10/01/19  7:18 PM  Result Value Ref Range   Sodium 139 135 - 145 mmol/L   Potassium 4.8 3.5 - 5.1 mmol/L   Chloride 106 98 - 111 mmol/L   CO2 24 22 - 32 mmol/L   Glucose, Bld 108 (H) 70 - 99 mg/dL   BUN 35 (H) 8 - 23 mg/dL   Creatinine, Ser 1.62 (H) 0.61 - 1.24 mg/dL   Calcium 8.7 (L) 8.9 - 10.3 mg/dL   GFR calc non Af Amer 41 (L) >60 mL/min   GFR calc Af Amer 48 (L) >60 mL/min   Anion gap 9 5 - 15    Comment: Performed at [facility], [address] [place], [place] [address]  CBC with Differential     Status: Abnormal   Collection Time: 10/01/19  7:18 PM  Result Value Ref Range   WBC 8.9 4.0 - 10.5 K/uL   RBC 3.25 (L) 4.22 - 5.81 MIL/uL   Hemoglobin 8.0 (L) 13.0 - 17.0 g/dL   HCT 26.4 (L) 39.0 - 52.0 %   MCV 81.2 80.0 - 100.0 fL   MCH 24.6 (L) 26.0 - 34.0 pg   MCHC 30.3 30.0 - 36.0 g/dL   RDW 16.3 (H) 11.5 - 15.5 %   Platelets 254 150 - 400 K/uL   nRBC 0.0 0.0 - 0.2 %   Neutrophils Relative % 74 %   Neutro Abs 6.5 1.7 - 7.7 K/uL   Lymphocytes Relative 15 %   Lymphs Abs 1.3 0.7 - 4.0 K/uL   Monocytes Relative 9 %   Monocytes Absolute 0.8 0.1 - 1.0 K/uL   Eosinophils Relative 2 %   Eosinophils Absolute 0.2 0.0 - 0.5 K/uL    Basophils Relative 0 %   Basophils Absolute 0.0 0.0 -  0.1 K/uL   Immature Granulocytes 0 %   Abs Immature Granulocytes 0.04 0.00 - 0.07 K/uL    Comment: Performed at [facility], [place], [place] [address]  Troponin I (High Sensitivity)     Status: Abnormal   Collection Time: 10/01/19  7:18 PM  Result Value Ref Range   Troponin I (High Sensitivity) 24 (H) <18 ng/L    Comment: (NOTE) Elevated high sensitivity troponin I (hsTnI) values and significant  changes across serial measurements may suggest ACS but many other  chronic and acute conditions are known to elevate hsTnI results.  Refer to the "Links" section for chest pain algorithms and additional  guidance. Performed at [facility], [place], [place] [address]   Troponin I (High Sensitivity)     Status: Abnormal   Collection Time: 10/01/19  9:15 PM  Result Value Ref Range   Troponin I (High Sensitivity) 21 (H) <18 ng/L    Comment: (NOTE) Elevated high sensitivity troponin I (hsTnI) values and significant  changes across serial measurements may suggest ACS but many other  chronic and acute conditions are known to elevate hsTnI results.  Refer to the "Links" section for chest pain algorithms and additional  guidance. Performed at [facility], [place], [place] [address]   SARS CORONAVIRUS 2 (TAT 6-24 HRS) Nasopharyngeal Nasopharyngeal Swab     Status: None   Collection Time: 10/01/19 10:57 PM   Specimen: Nasopharyngeal Swab  Result Value Ref Range   SARS Coronavirus 2 NEGATIVE NEGATIVE    Comment: (NOTE) SARS-CoV-2 target nucleic acids are NOT DETECTED. The SARS-CoV-2 RNA is generally detectable in upper and lower respiratory specimens during the acute phase of infection. Negative results do not preclude SARS-CoV-2 infection, do not rule out co-infections with other pathogens, and should not be used as the sole basis for treatment or other  patient management decisions. Negative results must be combined with clinical observations, patient history, and epidemiological information. The expected result is Negative. Fact Sheet for Patients: SugarRoll.be Fact Sheet for Healthcare Providers: https://www.woods-mathews.com/ This test is not yet approved or cleared by the [place] FDA and  has been authorized for detection and/or diagnosis of SARS-CoV-2 by FDA under an Emergency Use Authorization (EUA). This EUA will remain  in effect (meaning this test can be used) for the duration of the COVID-19 declaration under Section 56 4(b)(1) of the Act, 21 U.S.C. section 360bbb-3(b)(1), unless the authorization is terminated or revoked sooner. Performed at [facility], [place] [address], [place], [place] [address]   Troponin I (High Sensitivity)     Status: Abnormal   Collection Time: 10/01/19 11:39 PM  Result Value Ref Range   Troponin I (High Sensitivity) 21 (H) <18 ng/L    Comment: (NOTE) Elevated high sensitivity troponin I (hsTnI) values and significant  changes across serial measurements may suggest ACS but many other  chronic and acute conditions are known to elevate hsTnI results.  Refer to the "Links" section for chest pain algorithms and additional  guidance. Performed at [facility], [address] [place], [place] [address]   Hemoglobin A1c     Status: Abnormal   Collection Time: 10/01/19 11:39 PM  Result Value Ref Range   Hgb A1c MFr Bld 7.8 (H) 4.8 - 5.6 %    Comment: (NOTE) Pre diabetes:          5.7%-6.4% Diabetes:              >  6.4% Glycemic control for   <7.0% adults with diabetes    Mean Plasma Glucose 177.16 mg/dL    Comment: Performed at [facility] [address], [place], [place] [address]  Glucose, capillary     Status: None   Collection Time: 10/01/19 11:57 PM  Result Value Ref Range   Glucose-Capillary 87 70 - 99 mg/dL  Glucose,  capillary     Status: Abnormal   Collection Time: 10/02/19  7:21 AM  Result Value Ref Range   Glucose-Capillary 188 (H) 70 - 99 mg/dL  Glucose, capillary     Status: Abnormal   Collection Time: 10/02/19  1:07 PM  Result Value Ref Range   Glucose-Capillary 162 (H) 70 - 99 mg/dL  APTT     Status: None   Collection Time: 10/02/19  1:16 PM  Result Value Ref Range   aPTT 34 24 - 36 seconds    Comment: Performed at [facility], [address] [place], [place] [address]  Protime-INR     Status: None   Collection Time: 10/02/19  1:16 PM  Result Value Ref Range   Prothrombin Time 13.6 11.4 - 15.2 seconds   INR 1.1 0.8 - 1.2    Comment: (NOTE) INR goal varies based on device and disease states. Performed at [facility], [address] [place], [place] [address]   Glucose, capillary     Status: Abnormal   Collection Time: 10/02/19  4:44 PM  Result Value Ref Range   Glucose-Capillary 115 (H) 70 - 99 mg/dL  Glucose, capillary     Status: Abnormal   Collection Time: 10/02/19  8:49 PM  Result Value Ref Range   Glucose-Capillary 192 (H) 70 - 99 mg/dL  Heparin level (unfractionated)     Status: None   Collection Time: 10/02/19  8:59 PM  Result Value Ref Range   Heparin Unfractionated 0.35 0.30 - 0.70 IU/mL    Comment: (NOTE) If heparin results are below expected values, and patient dosage has  been confirmed, suggest follow up testing of antithrombin III levels. Performed at [facility], [place], [place] [address]   Troponin I (High Sensitivity)     Status: None   Collection Time: 10/03/19 12:08 AM  Result Value Ref Range   Troponin I (High Sensitivity) 12 <18 ng/L    Comment: (NOTE) Elevated high sensitivity troponin I (hsTnI) values and significant  changes across serial measurements may suggest ACS but many other  chronic and acute conditions are known to elevate hsTnI results.  Refer to the "Links" section for chest pain  algorithms and additional  guidance. Performed at [facility], [address] [place], [place] [id number]   Basic metabolic panel     Status: Abnormal   Collection Time: 10/03/19  1:49 AM  Result Value Ref Range   Sodium 137 135 - 145 mmol/L   Potassium 4.3 3.5 - 5.1 mmol/L   Chloride 108 98 - 111 mmol/L   CO2 21 (L) 22 - 32 mmol/L   Glucose, Bld 164 (H) 70 - 99 mg/dL   BUN 31 (H) 8 - 23 mg/dL   Creatinine, Ser 1.45 (H) 0.61 - 1.24 mg/dL   Calcium 8.5 (L) 8.9 - 10.3 mg/dL   GFR calc non Af Amer 47 (L) >60 mL/min   GFR calc Af Amer 55 (L) >60 mL/min   Anion gap 8 5 - 15    Comment: Performed at [facility], [address] [place], [place] [address]  Magnesium     Status: None   Collection Time: 10/03/19  1:49 AM  Result Value Ref Range   Magnesium 2.0 1.7 - 2.4 mg/dL    Comment: Performed at [facility], [address] [place], [place] [address]  CBC     Status: Abnormal   Collection Time: 10/03/19  1:49 AM  Result Value Ref Range   WBC 6.6 4.0 - 10.5 K/uL   RBC 2.77 (L) 4.22 - 5.81 MIL/uL   Hemoglobin 6.7 (L) 13.0 - 17.0 g/dL   HCT 22.0 (L) 39.0 - 52.0 %   MCV 79.4 (L) 80.0 - 100.0 fL   MCH 24.2 (L) 26.0 - 34.0 pg   MCHC 30.5 30.0 - 36.0 g/dL   RDW 16.2 (H) 11.5 - 15.5 %   Platelets 221 150 - 400 K/uL   nRBC 0.0 0.0 - 0.2 %    Comment: Performed at [facility], [place], [place] [address]  Heparin level (unfractionated)     Status: None   Collection Time: 10/03/19  1:49 AM  Result Value Ref Range   Heparin Unfractionated 0.39 0.30 - 0.70 IU/mL    Comment: (NOTE) If heparin results are below expected values, and patient dosage has  been confirmed, suggest follow up testing of antithrombin III levels. Performed at [facility], [place], [place] [address]   Troponin I (High Sensitivity)     Status: None   Collection Time: 10/03/19  1:49 AM  Result Value Ref Range   Troponin I (High  Sensitivity) 12 <18 ng/L    Comment: (NOTE) Elevated high sensitivity troponin I (hsTnI) values and significant  changes across serial measurements may suggest ACS but many other  chronic and acute conditions are known to elevate hsTnI results.  Refer to the "Links" section for chest pain algorithms and additional  guidance. Performed at [facility], [address] [place], [place] [address]   Glucose, capillary     Status: Abnormal   Collection Time: 10/03/19  7:18 AM  Result Value Ref Range   Glucose-Capillary 138 (H) 70 - 99 mg/dL  Prepare RBC     Status: None   Collection Time: 10/03/19  7:24 AM  Result Value Ref Range   Order Confirmation      ORDER PROCESSED BY BLOOD BANK Performed at [facility], [address] [place], [place] [address]   POCT Activated clotting time     Status: None   Collection Time: 10/03/19  8:31 AM  Result Value Ref Range   Activated Clotting Time 362 seconds  Occult blood card to lab, stool     Status: None   Collection Time: 10/03/19  9:24 AM  Result Value Ref Range   Fecal Occult Bld NEGATIVE NEGATIVE    Comment: Performed at [facility], [address] [place], [place] [address]  Glucose, capillary     Status: Abnormal   Collection Time: 10/03/19  9:55 AM  Result Value Ref Range   Glucose-Capillary 158 (H) 70 - 99 mg/dL  Basic metabolic panel     Status: Abnormal   Collection Time: 10/03/19 11:25 AM  Result Value Ref Range   Sodium 136 135 - 145 mmol/L   Potassium 4.6 3.5 - 5.1 mmol/L   Chloride 107 98 - 111 mmol/L   CO2 23 22 - 32 mmol/L   Glucose, Bld 257 (H) 70 - 99 mg/dL   BUN  27 (H) 8 - 23 mg/dL   Creatinine, Ser 1.39 (H) 0.61 - 1.24 mg/dL   Calcium 8.2 (L) 8.9 - 10.3 mg/dL   GFR calc non Af Amer 50 (L) >60 mL/min   GFR calc Af Amer 57 (L) >60 mL/min   Anion gap 6 5 - 15    Comment: Performed at [facility], [address] [place], [place] [address]  Protime-INR     Status: Abnormal    Collection Time: 10/03/19 11:25 AM  Result Value Ref Range   Prothrombin Time 18.6 (H) 11.4 - 15.2 seconds   INR 1.6 (H) 0.8 - 1.2    Comment: (NOTE) INR goal varies based on device and disease states. Performed at [facility], [address] [place], [place] [address]   CBC     Status: Abnormal   Collection Time: 10/03/19 11:25 AM  Result Value Ref Range   WBC 5.6 4.0 - 10.5 K/uL   RBC 2.86 (L) 4.22 - 5.81 MIL/uL   Hemoglobin 6.9 (L) 13.0 - 17.0 g/dL   HCT 23.2 (L) 39.0 - 52.0 %   MCV 81.1 80.0 - 100.0 fL   MCH 24.1 (L) 26.0 - 34.0 pg   MCHC 29.7 (L) 30.0 - 36.0 g/dL   RDW 16.5 (H) 11.5 - 15.5 %   Platelets 240 150 - 400 K/uL   nRBC 0.0 0.0 - 0.2 %    Comment: Performed at [facility], [address] [place], [place] [address]  Vitamin B12     Status: None   Collection Time: 10/03/19 11:25 AM  Result Value Ref Range   Vitamin B-12 432 180 - 914 pg/mL    Comment: (NOTE) This assay is not validated for testing neonatal or myeloproliferative syndrome specimens for Vitamin B12 levels. Performed at [facility], [place] [address], [place], [place] [address]   Folate     Status: None   Collection Time: 10/03/19 11:25 AM  Result Value Ref Range   Folate 12.8 >5.9 ng/mL    Comment: Performed at [facility], [address] [place], [place] [address]  Iron and TIBC     Status: Abnormal   Collection Time: 10/03/19 11:25 AM  Result Value Ref Range   Iron 17 (L) 45 - 182 ug/dL   TIBC 391 250 - 450 ug/dL   Saturation Ratios 4 (L) 17.9 - 39.5 %   UIBC 374 ug/dL    Comment: Performed at [facility], [address] [place], [place] [address]  Ferritin     Status: Abnormal   Collection Time: 10/03/19 11:25 AM  Result Value Ref Range   Ferritin 9 (L) 24 - 336 ng/mL    Comment: Performed at [facility], [address] [place], [place] [address]  Reticulocytes     Status: Abnormal   Collection Time: 10/03/19 11:25 AM    Result Value Ref Range   Retic Ct Pct 1.2 0.4 - 3.1 %   RBC. 2.81 (L) 4.22 - 5.81 MIL/uL   Retic Count, Absolute 33.2 19.0 - 186.0 K/uL   Immature Retic Fract 14.8 2.3 - 15.9 %    Comment: REPEATED TO VERIFY Performed at [facility], [address] [place], [place] [address]   Type and screen [place]     Status: None   Collection Time: 10/03/19 11:25 AM  Result Value Ref Range   ABO/RH(D) O POS    Antibody Screen NEG  Sample Expiration 10/06/2019,2359    Unit Number [id number]    Blood Component Type RED CELLS,LR    Unit division 00    Status of Unit ISSUED,FINAL    Transfusion Status OK TO TRANSFUSE    Crossmatch Result Compatible    Unit Number [id number]    Blood Component Type RED CELLS,LR    Unit division 00    Status of Unit ISSUED,FINAL    Transfusion Status OK TO TRANSFUSE    Crossmatch Result Compatible    Unit Number [id number]    Blood Component Type RED CELLS,LR    Unit division 00    Status of Unit ISSUED,FINAL    Transfusion Status OK TO TRANSFUSE    Crossmatch Result      Compatible Performed at [facility], [address] [place], [place] [address]   BPAM RBC     Status: None   Collection Time: 10/03/19 11:25 AM  Result Value Ref Range   ISSUE DATE / TIME [id number]    Blood Product Unit Number [id number]    PRODUCT CODE [id number]    Unit Type and Rh 9500    Blood Product Expiration Date 202101252359    ISSUE DATE / TIME [id number]    Blood Product Unit Number [id number]    PRODUCT CODE [id number]    Unit Type and Rh 9500    Blood Product Expiration Date 202101252359    ISSUE DATE / TIME [id number]    Blood Product Unit Number [id number]    PRODUCT CODE [id number]    Unit Type and Rh 5100    Blood Product Expiration Date [id number]   Glucose, capillary     Status: Abnormal   Collection Time: 10/03/19 11:40 AM  Result Value Ref Range   Glucose-Capillary 227 (H) 70 - 99  mg/dL  Glucose, capillary     Status: Abnormal   Collection Time: 10/03/19  4:30 PM  Result Value Ref Range   Glucose-Capillary 137 (H) 70 - 99 mg/dL  Hemoglobin and hematocrit, blood     Status: Abnormal   Collection Time: 10/03/19  7:14 PM  Result Value Ref Range   Hemoglobin 8.3 (L) 13.0 - 17.0 g/dL   HCT 26.9 (L) 39.0 - 52.0 %    Comment: Performed at [facility], [address] [place], [place] [address]  Glucose, capillary     Status: Abnormal   Collection Time: 10/03/19  9:01 PM  Result Value Ref Range   Glucose-Capillary 232 (H) 70 - 99 mg/dL  CBC     Status: Abnormal   Collection Time: 10/04/19  6:36 AM  Result Value Ref Range   WBC 7.6 4.0 - 10.5 K/uL   RBC 3.03 (L) 4.22 - 5.81 MIL/uL   Hemoglobin 7.6 (L) 13.0 - 17.0 g/dL   HCT 24.5 (L) 39.0 - 52.0 %   MCV 80.9 80.0 - 100.0 fL   MCH 25.1 (L) 26.0 - 34.0 pg   MCHC 31.0 30.0 - 36.0 g/dL   RDW 16.4 (H) 11.5 - 15.5 %   Platelets 224 150 - 400 K/uL   nRBC 0.0 0.0 - 0.2 %    Comment: Performed at [facility] [facility], [address] [place], [place] [id number]  Basic metabolic panel     Status: Abnormal   Collection Time: 10/04/19  6:36 AM  Result Value Ref Range   Sodium 138 135 - 145 mmol/L   Potassium 4.4 3.5 - 5.1 mmol/L   Chloride 109 98 - 111 mmol/L  CO2 22 22 - 32 mmol/L   Glucose, Bld 180 (H) 70 - 99 mg/dL   BUN 31 (H) 8 - 23 mg/dL   Creatinine, Ser 1.63 (H) 0.61 - 1.24 mg/dL   Calcium 8.4 (L) 8.9 - 10.3 mg/dL   GFR calc non Af Amer 41 (L) >60 mL/min   GFR calc Af Amer 47 (L) >60 mL/min   Anion gap 7 5 - 15    Comment: Performed at [facility], [address] [place], [place] [address]  Glucose, capillary     Status: Abnormal   Collection Time: 10/04/19  8:00 AM  Result Value Ref Range   Glucose-Capillary 162 (H) 70 - 99 mg/dL  Glucose, capillary     Status: Abnormal   Collection Time: 10/04/19 11:33 AM  Result Value Ref Range   Glucose-Capillary 228 (H) 70 - 99 mg/dL   Glucose, capillary     Status: Abnormal   Collection Time: 10/04/19  4:58 PM  Result Value Ref Range   Glucose-Capillary 112 (H) 70 - 99 mg/dL  Sample to Blood Bank     Status: None   Collection Time: 10/04/19  6:43 PM  Result Value Ref Range   Blood Bank Specimen SAMPLE AVAILABLE FOR TESTING    Sample Expiration      10/07/2019,2359 Performed at [facility], [address] [place], [place] [address]   Glucose, capillary     Status: Abnormal   Collection Time: 10/04/19  9:11 PM  Result Value Ref Range   Glucose-Capillary 227 (H) 70 - 99 mg/dL  Glucose, capillary     Status: Abnormal   Collection Time: 10/05/19  8:08 AM  Result Value Ref Range   Glucose-Capillary 151 (H) 70 - 99 mg/dL  Hemoglobin and hematocrit, blood     Status: Abnormal   Collection Time: 10/05/19  9:20 AM  Result Value Ref Range   Hemoglobin 7.9 (L) 13.0 - 17.0 g/dL   HCT 24.9 (L) 39.0 - 52.0 %    Comment: Performed at [facility], [address] [place], [place] [address]  Prepare RBC     Status: None   Collection Time: 10/05/19 10:30 AM  Result Value Ref Range   Order Confirmation      ORDER PROCESSED BY BLOOD BANK Performed at [facility], [address] [place], [place] [address]   Glucose, capillary     Status: Abnormal   Collection Time: 10/05/19 11:27 AM  Result Value Ref Range   Glucose-Capillary 127 (H) 70 - 99 mg/dL  Hemoglobin and hematocrit, blood     Status: Abnormal   Collection Time: 10/05/19  3:24 PM  Result Value Ref Range   Hemoglobin 9.4 (L) 13.0 - 17.0 g/dL   HCT 30.2 (L) 39.0 - 52.0 %    Comment: Performed at [facility], [address] [place],  [id number]  Basic metabolic panel     Status: Abnormal   Collection Time: 10/13/19  3:53 PM  Result Value Ref Range   Sodium 138 135 - 145 mmol/L   Potassium 5.3 (H) 3.5 - 5.1 mmol/L   Chloride 106 98 - 111 mmol/L   CO2 19 (L) 22 - 32 mmol/L   Glucose, Bld 155 (H) 70 - 99 mg/dL    BUN 42 (H) 8 - 23 mg/dL   Creatinine, Ser 2.21 (H) 0.61 - 1.24 mg/dL   Calcium 9.1 8.9 - 10.3 mg/dL   GFR calc non Af Amer 28 (L) >60  mL/min   GFR calc Af Amer 33 (L) >60 mL/min   Anion gap 13 5 - 15    Comment: Performed at [facility], [address] [place], [place] [address]  CBC     Status: Abnormal   Collection Time: 10/13/19  3:53 PM  Result Value Ref Range   WBC 7.5 4.0 - 10.5 K/uL   RBC 4.24 4.22 - 5.81 MIL/uL   Hemoglobin 11.4 (L) 13.0 - 17.0 g/dL   HCT 36.9 (L) 39.0 - 52.0 %   MCV 87.0 80.0 - 100.0 fL   MCH 26.9 26.0 - 34.0 pg   MCHC 30.9 30.0 - 36.0 g/dL   RDW 21.5 (H) 11.5 - 15.5 %   Platelets 368 150 - 400 K/uL   nRBC 0.0 0.0 - 0.2 %    Comment: Performed at [facility], [place], [place] [address]  Troponin I (High Sensitivity)     Status: None   Collection Time: 10/13/19  3:53 PM  Result Value Ref Range   Troponin I (High Sensitivity) 4 <18 ng/L    Comment: (NOTE) Elevated high sensitivity troponin I (hsTnI) values and significant  changes across serial measurements may suggest ACS but many other  chronic and acute conditions are known to elevate hsTnI results.  Refer to the "Links" section for chest pain algorithms and additional  guidance. Performed at [facility], [place],  [address]   SARS CORONAVIRUS 2 (TAT 6-24 HRS) Nasopharyngeal Nasopharyngeal Swab     Status: None   Collection Time: 10/13/19  8:12 PM   Specimen: Nasopharyngeal Swab  Result Value Ref Range   SARS Coronavirus 2 NEGATIVE NEGATIVE    Comment: (NOTE) SARS-CoV-2 target nucleic acids are NOT DETECTED. The SARS-CoV-2 RNA is generally detectable in upper and lower respiratory specimens during the acute phase of infection. Negative results do not preclude SARS-CoV-2 infection, do not rule out co-infections with other pathogens, and should not be used as the sole basis for treatment or other patient management  decisions. Negative results must be combined with clinical observations, patient history, and epidemiological information. The expected result is Negative. Fact Sheet for Patients: SugarRoll.be Fact Sheet for Healthcare Providers: https://www.woods-mathews.com/ This test is not yet approved or cleared by the [place] FDA and  has been authorized for detection and/or diagnosis of SARS-CoV-2 by FDA under an Emergency Use Authorization (EUA). This EUA will remain  in effect (meaning this test can be used) for the duration of the COVID-19 declaration under Section 56 4(b)(1) of the Act, 21 U.S.C. section 360bbb-3(b)(1), unless the authorization is terminated or revoked sooner. Performed at [facility], [place] [address], [place], [place] [address]   Glucose, capillary     Status: Abnormal   Collection Time: 10/14/19  1:09 AM  Result Value Ref Range   Glucose-Capillary 123 (H) 70 - 99 mg/dL  Glucose, capillary     Status: Abnormal   Collection Time: 10/14/19  3:28 AM  Result Value Ref Range   Glucose-Capillary 115 (H) 70 - 99 mg/dL  Basic metabolic panel     Status: Abnormal   Collection Time: 10/14/19  5:51 AM  Result Value Ref Range   Sodium 138 135 - 145 mmol/L   Potassium 4.3 3.5 - 5.1 mmol/L   Chloride 110 98 - 111 mmol/L   CO2 19 (L) 22 - 32 mmol/L   Glucose, Bld 145 (H) 70 - 99 mg/dL   BUN 39 (  H) 8 - 23 mg/dL   Creatinine, Ser 1.67 (H) 0.61 - 1.24 mg/dL   Calcium 8.3 (L) 8.9 - 10.3 mg/dL   GFR calc non Af Amer 40 (L) >60 mL/min   GFR calc Af Amer 46 (L) >60 mL/min   Anion gap 9 5 - 15    Comment: Performed at [facility], [address], [place], [place] [address]  Magnesium     Status: None   Collection Time: 10/14/19  5:51 AM  Result Value Ref Range   Magnesium 2.0 1.7 - 2.4 mg/dL    Comment: Performed at [facility], [address] [place], [place] [address]  Hemoglobin A1c     Status: Abnormal    Collection Time: 10/14/19  5:51 AM  Result Value Ref Range   Hgb A1c MFr Bld 6.8 (H) 4.8 - 5.6 %    Comment: (NOTE) Pre diabetes:          5.7%-6.4% Diabetes:              >6.4% Glycemic control for   <7.0% adults with diabetes    Mean Plasma Glucose 148.46 mg/dL    Comment: Performed at [facility] [address] [place], [place] [address]  Glucose, capillary     Status: Abnormal   Collection Time: 10/14/19  8:13 AM  Result Value Ref Range   Glucose-Capillary 125 (H) 70 - 99 mg/dL  Glucose, capillary     Status: Abnormal   Collection Time: 10/14/19 12:10 PM  Result Value Ref Range   Glucose-Capillary 163 (H) 70 - 99 mg/dL  SARS CORONAVIRUS 2 (TAT 6-24 HRS) Nasopharyngeal Nasopharyngeal Swab     Status: None   Collection Time: 11/01/19 12:22 PM   Specimen: Nasopharyngeal Swab  Result Value Ref Range   SARS Coronavirus 2 NEGATIVE NEGATIVE    Comment: (NOTE) SARS-CoV-2 target nucleic acids are NOT DETECTED. The SARS-CoV-2 RNA is generally detectable in upper and lower respiratory specimens during the acute phase of infection. Negative results do not preclude SARS-CoV-2 infection, do not rule out co-infections with other pathogens, and should not be used as the sole basis for treatment or other patient management decisions. Negative results must be combined with clinical observations, patient history, and epidemiological information. The expected result is Negative. Fact Sheet for Patients: SugarRoll.be Fact Sheet for Healthcare Providers: https://www.woods-mathews.com/ This test is not yet approved or cleared by the [place] FDA and  has been authorized for detection and/or diagnosis of SARS-CoV-2 by FDA under an Emergency Use Authorization (EUA). This EUA will remain  in effect (meaning this test can be used) for the duration of the COVID-19 declaration under Section 56 4(b)(1) of the Act, 21 U.S.C. section  360bbb-3(b)(1), unless the authorization is terminated or revoked sooner. Performed at [facility], [place] [address] [place], [place] [address]   Glucose, capillary     Status: Abnormal   Collection Time: 11/04/19  7:55 AM  Result Value Ref Range   Glucose-Capillary 133 (H) 70 - 99 mg/dL  BUN     Status: Abnormal   Collection Time: 11/04/19  8:01 AM  Result Value Ref Range   BUN 29 (H) 8 - 23 mg/dL    Comment: Performed at [facility], [address] [place], [place] [address]  Creatinine, serum     Status: Abnormal   Collection Time: 11/04/19  8:01 AM  Result Value Ref Range   Creatinine, Ser 1.46 (H) 0.61 - 1.24  mg/dL   GFR calc non Af Amer 47 (L) >60 mL/min   GFR calc Af Amer 54 (L) >60 mL/min    Comment: Performed at [facility], [address] [place], [place] [address]    Radiology PERIPHERAL VASCULAR CATHETERIZATION  Result Date: 11/04/2019 See op note   Assessment/Plan HTN (hypertension), benign blood pressure control important in reducing the progression of atherosclerotic disease. On appropriate oral medications.   DM type 2 with diabetic peripheral neuropathy (HCC) blood glucose control important in reducing the progression of atherosclerotic disease. Also, involved in wound healing. On appropriate medications.  Chronic mesenteric ischemia (HCC) Doing well after celiac angioplasty a month ago and a previously placed SMA stent.  Symptoms markedly improved.  No change in medical regimen at this time.  Plan to check in 2 to 3 months with noninvasive studies.    [name], MD  12/03/2019 1:57 PM    This note was created with Dragon medical transcription system.  Any errors from dictation are purely unintentional

## 2019-12-04 ENCOUNTER — Other Ambulatory Visit: Payer: Self-pay | Admitting: *Deleted

## 2019-12-04 NOTE — Patient Outreach (Signed)
[facility]) Care Management  [place]  12/05/2019   Patrick Turner 03-Dec-1945 [id number]   Telephone assessment   Recent Hospital Admissions 1/19-1/23/21 for Chest Pain, PCI stents to Mid and distal LAD, anemia.  1/31-10/14/19 for Acute Renal Failure, pseudoaneurysm right femoral artery.   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 Chronic mesenteric ischemia. Frequent Falls. peripheral neuropathy, 5th metatarsal fracture, Iron deficiency anemia.  11/04/19 Aortogram and selective angiogram of celiac and superior mesentric artery. Angioplasty of celiac artery .  Subjective: Incoming call from patient prior to scheduled call in next week, states he is calling today due to being off from work and he may be working on next week. He states he does not always no his work schedule a few weeks out.  Patient discussed progress with outpatient physical therapy is going slow, stating therapist suggested that he may need to see a neurologist due to nerve pain that he is having behind his knee . He states it does not hurt to walk ,just sore to touch.  He denies having recent fall, he continues to use his cane, wear boot to right foot area. He report tolerating shift of 4 to 5 hours.   Patient discussed recent follow up with Dr. Lucky Cowboy after having celiac angioplasty , he denies having further abdominal area pain and plans for follow up in the next 3 months.  He states overall he is doing better his blood pressure is ranging 130-140/60 heart in the 60's. He reports blood sugar range of 90 to 120's he denies recent low blood sugar episode.   He reports to receive 2nd covid vaccine on 12/05/19.   Encounter Medications:  Outpatient Encounter Medications as of 12/04/2019  Medication Sig  . ACCU-CHEK AVIVA PLUS test strip Use to test blood sugar twice daily  .  Accu-Chek Softclix Lancets lancets Use to test blood sugar twice daily  . acetaminophen (TYLENOL) 325 MG tablet Take 2 tablets (650 mg total) by mouth every 6 (six) hours as needed for mild pain (or Fever >/= 101).  Marland Kitchen aspirin 81 MG tablet Take 1 tablet (81 mg total) by mouth daily.  Marland Kitchen atorvastatin (LIPITOR) 80 MG tablet Take 80 mg by mouth at bedtime.  . clonazePAM (KLONOPIN) 1 MG tablet Take 0.5 mg by mouth at bedtime as needed for anxiety.   . clopidogrel (PLAVIX) 75 MG tablet Take 1 tablet (75 mg total) by mouth daily.  Marland Kitchen gabapentin (NEURONTIN) 400 MG capsule Take 800 mg by mouth at bedtime.   . insulin detemir (LEVEMIR) 100 UNIT/ML injection Inject 15 Units into the skin daily.  . iron polysaccharides (NIFEREX) 150 MG capsule Take 150 mg by mouth daily.  Marland Kitchen lisinopril (ZESTRIL) 20 MG tablet Take 20 mg by mouth daily.  . metFORMIN (GLUCOPHAGE-XR) 500 MG 24 hr tablet Take 500 mg by mouth 2 (two) times daily with a meal. 2 tablets twice daily with meals  . metoprolol tartrate (LOPRESSOR) 25 MG tablet Take 0.5 tablets (12.5 mg total) by mouth 2 (two) times daily. (Patient taking differently: Take 25 mg by mouth at bedtime. )  . Omega-3 Fatty Acids (FISH OIL) 1000 MG CAPS Take 1,000 mg by mouth 2 (two) times daily.   Marland Kitchen OZEMPIC, 0.25 OR 0.5 MG/DOSE, 2 MG/1.5ML SOPN Inject 1 Dose into the skin once a week. Wednesdays  . pantoprazole (PROTONIX) 40 MG tablet Take 40 mg by mouth daily.  Marland Kitchen  PARoxetine (PAXIL) 40 MG tablet Take 40 mg by mouth daily.   . pioglitazone (ACTOS) 15 MG tablet Take 15 mg by mouth daily.   . ranolazine (RANEXA) 500 MG 12 hr tablet Take 1 tablet by mouth 2 (two) times daily.  . vitamin B-12 (CYANOCOBALAMIN) 1000 MCG tablet Take 1,000 mcg by mouth daily.   No facility-administered encounter medications on file as of 12/04/2019.    Functional Status:  In your present state of health, do you have any difficulty performing the following activities: 11/04/2019 10/16/2019  Hearing? Y Y   Comment - wears hearing aide  Vision? N N  Difficulty concentrating or making decisions? N N  Walking or climbing stairs? Y Y  Comment - fall risk, uses cane/walker  Dressing or bathing? N N  Doing errands, shopping? - Scientist, forensic and eating ? - N  Using the Toilet? - N  In the past six months, have you accidently leaked urine? - N  Do you have problems with loss of bowel control? - N  Managing your Medications? - N  Managing your Finances? - N  Housekeeping or managing your Housekeeping? - Y  Comment - wife assist  Some recent data might be hidden    Fall/Depression Screening: Fall Risk  10/11/2019  Falls in the past year? 1  Number falls in past yr: 1  Injury with Fall? 0  Risk for fall due to : History of fall(s);Impaired balance/gait  Follow up Falls prevention discussed   PHQ 2/9 Scores 10/11/2019  PHQ - 2 Score 0    Assessment:   Chest pain - no recurrent episode, verifies taking medications as prescribed, continuing to organize and manage on his own. He continues to monitor blood pressures and keeping a log .  High Fall Risk/Right Knee discomfort  - no recent falls, will benefit on continued follow up with outpatient therapy as recommendation. Podiatry follow up in the next month.  Education on fall prevention. He denies dizziness. .  Diabetes - no low blood sugar readings, reinforced balanced meals. Teach back on treating low blood sugars of 70 or below and notify MD if concern for recurrent episodes .   Anemia - GI follow up and planned capsule study in next week.  Appointments : Dr. Gabriel Carina 4/16 and A1c check.  GI, Capsule study- 12/11/19  Patient will benefit from ongoing education support of chronic conditions. Encouraged to contact RN sooner if  new concerns prior to next scheduled appointment.    Plan:  Will plan return call in the next month as agreed with patient per his schedule.  THN CM Care Plan Problem One     Most Recent Value  Care Plan Problem  One  Patient with risk for readmission related to recent hospital admission for chest pain/stent placement   Role Documenting the Problem One  Care Management [place] for Problem One  Active  [facility] Long Term Goal   Patient will not experience a hospital admission over the next 60 days   THN Long Term Goal Start Date  10/16/19  Interventions for Problem One Long Term Goal  Assessed current clinical state regarding chest pain or shortness of breath, reinforced notifying MD of recurrent concern and seek medication attentiion . Reinforced taking mediacations as prescribed and notifying of concerns if identified.   THN CM Short Term Goal #2   Patient will be able to reports monitoring blood pressure dailly over the next 30 days and keeping  record.   THN CM Short Term Goal #2 Start Date  11/20/19  Interventions for Short Term Goal #2  praised patient on continuing to keep track of blood pressure , encouraged to take record to next PCP visit .     THN CM Care Plan Problem Two     Most Recent Value  Care Plan Problem Two  Patient high risk for falls as evidenced by report of falls occurences   Role Documenting the Problem Two  Care Management [place] for Problem Two  Active  Interventions for Problem Two Long Term Goal   Review progress with therapy , reviewed attending therapy sessions as recommended. Encouraged notifying MD of recurrent falls to evaluate if concern for injury.   THN Long Term Goal  Over the next 75 days patient will be able to identify at least 2  measures to prevent falls  [goal extended ]  THN Long Term Goal Start Date  10/16/19  [facility] CM Short Term Goal #1   Over the next 30 days patient will be able to report decrease in fall occurences as evidenced by report .   THN CM Short Term Goal #1 Start Date  11/20/19  Interventions for Short Term Goal #2   Review of recent fall history, encouraged using rolling walker/cane for support..  Reviewed fall prevention measures encouraged follow up with son support for repairing gate so he can uses that entrance that does not have steps to exit home.   THN CM Short Term Goal #2   Over the next 30 days patient will be able to verbalize improved strength and decrease discomfort in right knee area and foot  [goal restated ]  THN CM Short Term Goal #2 Start Date  12/04/19  Interventions for Short Term Goal #2  Recommended continued follow up with upcoming podiatry visit , and wearing support boot to ankle as recommended.       Joylene Draft, RN, BSN  [place] Management Coordinator  [phone]- Mobile [phone]- Toll Free Main Office

## 2019-12-05 ENCOUNTER — Ambulatory Visit: Payer: Medicare HMO | Attending: Internal Medicine

## 2019-12-05 DIAGNOSIS — Z23 Encounter for immunization: Secondary | ICD-10-CM

## 2019-12-05 DIAGNOSIS — R531 Weakness: Secondary | ICD-10-CM | POA: Diagnosis not present

## 2019-12-05 NOTE — Progress Notes (Signed)
   Covid-19 Vaccination Clinic  Name:  Patrick Turner    MRN: [id number] DOB: 06-07-1946  12/05/2019  [name] was observed post Covid-19 immunization for 15 minutes without incident. He was provided with Vaccine Information Sheet and instruction to access the V-Safe system.   [name] was instructed to call 911 with any severe reactions post vaccine: Marland Kitchen Difficulty breathing  . Swelling of face and throat  . A fast heartbeat  . A bad rash all over body  . Dizziness and weakness   Immunizations Administered    Name Date Dose VIS Date Route   Pfizer COVID-19 Vaccine 12/05/2019 10:25 AM 0.3 mL 08/23/2019 Intramuscular   Manufacturer: [place]   Lot: [id number]   [place]: [id number]

## 2019-12-06 ENCOUNTER — Ambulatory Visit: Payer: Medicare HMO | Admitting: *Deleted

## 2019-12-11 ENCOUNTER — Ambulatory Visit: Payer: Medicare HMO | Admitting: *Deleted

## 2019-12-11 DIAGNOSIS — D509 Iron deficiency anemia, unspecified: Secondary | ICD-10-CM | POA: Diagnosis not present

## 2019-12-12 DIAGNOSIS — S92354D Nondisplaced fracture of fifth metatarsal bone, right foot, subsequent encounter for fracture with routine healing: Secondary | ICD-10-CM | POA: Diagnosis not present

## 2019-12-12 DIAGNOSIS — R531 Weakness: Secondary | ICD-10-CM | POA: Diagnosis not present

## 2019-12-19 DIAGNOSIS — E1142 Type 2 diabetes mellitus with diabetic polyneuropathy: Secondary | ICD-10-CM | POA: Diagnosis not present

## 2019-12-19 DIAGNOSIS — K6389 Other specified diseases of intestine: Secondary | ICD-10-CM | POA: Diagnosis not present

## 2019-12-19 DIAGNOSIS — K551 Chronic vascular disorders of intestine: Secondary | ICD-10-CM | POA: Diagnosis not present

## 2019-12-19 DIAGNOSIS — K5521 Angiodysplasia of colon with hemorrhage: Secondary | ICD-10-CM | POA: Diagnosis not present

## 2019-12-19 DIAGNOSIS — Z7902 Long term (current) use of antithrombotics/antiplatelets: Secondary | ICD-10-CM | POA: Diagnosis not present

## 2019-12-19 DIAGNOSIS — D509 Iron deficiency anemia, unspecified: Secondary | ICD-10-CM | POA: Diagnosis not present

## 2019-12-24 DIAGNOSIS — R531 Weakness: Secondary | ICD-10-CM | POA: Diagnosis not present

## 2019-12-24 DIAGNOSIS — E1159 Type 2 diabetes mellitus with other circulatory complications: Secondary | ICD-10-CM | POA: Diagnosis not present

## 2019-12-27 DIAGNOSIS — E1142 Type 2 diabetes mellitus with diabetic polyneuropathy: Secondary | ICD-10-CM | POA: Diagnosis not present

## 2019-12-27 DIAGNOSIS — E785 Hyperlipidemia, unspecified: Secondary | ICD-10-CM | POA: Diagnosis not present

## 2019-12-27 DIAGNOSIS — E1159 Type 2 diabetes mellitus with other circulatory complications: Secondary | ICD-10-CM | POA: Diagnosis not present

## 2019-12-27 DIAGNOSIS — Z0000 Encounter for general adult medical examination without abnormal findings: Secondary | ICD-10-CM | POA: Diagnosis not present

## 2019-12-27 DIAGNOSIS — I2510 Atherosclerotic heart disease of native coronary artery without angina pectoris: Secondary | ICD-10-CM | POA: Diagnosis not present

## 2019-12-27 DIAGNOSIS — E113393 Type 2 diabetes mellitus with moderate nonproliferative diabetic retinopathy without macular edema, bilateral: Secondary | ICD-10-CM | POA: Diagnosis not present

## 2019-12-27 DIAGNOSIS — E1169 Type 2 diabetes mellitus with other specified complication: Secondary | ICD-10-CM | POA: Diagnosis not present

## 2019-12-27 DIAGNOSIS — Z794 Long term (current) use of insulin: Secondary | ICD-10-CM | POA: Diagnosis not present

## 2019-12-27 DIAGNOSIS — N183 Chronic kidney disease, stage 3 unspecified: Secondary | ICD-10-CM | POA: Diagnosis not present

## 2019-12-27 DIAGNOSIS — E1122 Type 2 diabetes mellitus with diabetic chronic kidney disease: Secondary | ICD-10-CM | POA: Diagnosis not present

## 2019-12-27 DIAGNOSIS — I129 Hypertensive chronic kidney disease with stage 1 through stage 4 chronic kidney disease, or unspecified chronic kidney disease: Secondary | ICD-10-CM | POA: Diagnosis not present

## 2019-12-31 ENCOUNTER — Ambulatory Visit (INDEPENDENT_AMBULATORY_CARE_PROVIDER_SITE_OTHER): Payer: Medicare HMO

## 2019-12-31 ENCOUNTER — Encounter (INDEPENDENT_AMBULATORY_CARE_PROVIDER_SITE_OTHER): Payer: Self-pay | Admitting: Vascular Surgery

## 2019-12-31 ENCOUNTER — Ambulatory Visit (INDEPENDENT_AMBULATORY_CARE_PROVIDER_SITE_OTHER): Payer: Medicare HMO | Admitting: Vascular Surgery

## 2019-12-31 ENCOUNTER — Other Ambulatory Visit: Payer: Self-pay

## 2019-12-31 VITALS — BP 200/67 | HR 69 | Ht 65.0 in | Wt 192.0 lb

## 2019-12-31 DIAGNOSIS — E1142 Type 2 diabetes mellitus with diabetic polyneuropathy: Secondary | ICD-10-CM

## 2019-12-31 DIAGNOSIS — K551 Chronic vascular disorders of intestine: Secondary | ICD-10-CM

## 2019-12-31 DIAGNOSIS — E785 Hyperlipidemia, unspecified: Secondary | ICD-10-CM | POA: Diagnosis not present

## 2019-12-31 DIAGNOSIS — I10 Essential (primary) hypertension: Secondary | ICD-10-CM | POA: Diagnosis not present

## 2019-12-31 NOTE — Assessment & Plan Note (Signed)
Duplex today shows the celiac velocities to be within normal limits.  His SMA velocities are elevated, but his previous angiogram did not show any high-grade SMA stenosis.  Overall doing well.  Continue current medical regimen.  Would plan to repeat duplex in 6 months or sooner if problems develop in the interim

## 2019-12-31 NOTE — Assessment & Plan Note (Signed)
lipid control important in reducing the progression of atherosclerotic disease. Continue statin therapy  

## 2019-12-31 NOTE — Progress Notes (Signed)
MRN : [id number]  Patrick Turner is a 74 y.o. ([birth year]) male who presents with chief complaint of  Chief Complaint  Patient presents with  . Follow-up    U/S Follow up  .  History of Present Illness: Patient returns today in follow up of his mesenteric ischemia.  He underwent celiac angioplasty earlier this year with good results.  He has had a noticeable improvement in his pain and eating difficulties.  He says he generally feels better even than he did last month.  He is eating well and not losing weight.  Duplex today shows the celiac velocities to be within normal limits.  His SMA velocities are elevated, but his previous angiogram did not show any high-grade SMA stenosis. He has had an endoscopy since his last visit and says he is now being evaluated and needs biopsies for some polyps although it is not entirely clear to me where this might be.  He says he thinks they are in the small bowel and he has been referred to [facility] for further evaluation so that sounds possible.  Current Outpatient Medications  Medication Sig Dispense Refill  . ACCU-CHEK AVIVA PLUS test strip Use to test blood sugar twice daily    . Accu-Chek Softclix Lancets lancets Use to test blood sugar twice daily    . acetaminophen (TYLENOL) 325 MG tablet Take 2 tablets (650 mg total) by mouth every 6 (six) hours as needed for mild pain (or Fever >/= 101). 50 tablet 0  . aspirin 81 MG tablet Take 1 tablet (81 mg total) by mouth daily.    Marland Kitchen atorvastatin (LIPITOR) 80 MG tablet Take 80 mg by mouth at bedtime.    . clonazePAM (KLONOPIN) 1 MG tablet Take 0.5 mg by mouth at bedtime as needed for anxiety.   2  . clopidogrel (PLAVIX) 75 MG tablet Take 1 tablet (75 mg total) by mouth daily. 30 tablet 11  . gabapentin (NEURONTIN) 400 MG capsule Take 800 mg by mouth at bedtime.     . insulin detemir (LEVEMIR) 100 UNIT/ML injection Inject 15 Units into the skin daily.    . iron polysaccharides (NIFEREX) 150 MG capsule Take 150  mg by mouth daily.    Marland Kitchen lisinopril (ZESTRIL) 20 MG tablet Take 20 mg by mouth daily.    . metFORMIN (GLUCOPHAGE-XR) 500 MG 24 hr tablet Take 500 mg by mouth 2 (two) times daily with a meal. 2 tablets twice daily with meals    . metoprolol tartrate (LOPRESSOR) 25 MG tablet Take 0.5 tablets (12.5 mg total) by mouth 2 (two) times daily. (Patient taking differently: Take 25 mg by mouth at bedtime. ) 60 tablet 0  . Omega-3 Fatty Acids (FISH OIL) 1000 MG CAPS Take 1,000 mg by mouth 2 (two) times daily.     Marland Kitchen OZEMPIC, 0.25 OR 0.5 MG/DOSE, 2 MG/1.5ML SOPN Inject 1 Dose into the skin once a week. Wednesdays    . pantoprazole (PROTONIX) 40 MG tablet Take 40 mg by mouth daily.    Marland Kitchen PARoxetine (PAXIL) 40 MG tablet Take 40 mg by mouth daily.     . pioglitazone (ACTOS) 15 MG tablet Take 15 mg by mouth daily.     . ranolazine (RANEXA) 500 MG 12 hr tablet Take 1 tablet by mouth 2 (two) times daily.    . vitamin B-12 (CYANOCOBALAMIN) 1000 MCG tablet Take 1,000 mcg by mouth daily.     No current facility-administered medications for this visit.  Past Medical History:  Diagnosis Date  . Altered mental status   . Anemia   . Anxiety   . Aortic atherosclerosis ([place])   . Arthritis   . Coronary artery disease   . CRD (chronic renal disease)   . Depression   . Diabetes mellitus without complication ([place])   . Encephalopathy acute   . GERD (gastroesophageal reflux disease)   . Headache   . Hypercholesteremia   . Hypertension   . Neuropathy   . Severe obesity ([place])   . Sleep apnea   . Sleep terror    per patient this year per patient     Past Surgical History:  Procedure Laterality Date  . APPENDECTOMY    . CIRCUMCISION    . COLONOSCOPY    . COLONOSCOPY WITH PROPOFOL N/A 07/10/2018   Procedure: COLONOSCOPY WITH PROPOFOL;  Surgeon: Lollie Sails, MD;  Location: [facility] ENDOSCOPY;  Service: Endoscopy;  Laterality: N/A;  . COLONOSCOPY WITH PROPOFOL N/A 04/23/2019   Procedure: COLONOSCOPY WITH  PROPOFOL;  Surgeon: Lollie Sails, MD;  Location: [facility] ENDOSCOPY;  Service: Endoscopy;  Laterality: N/A;  . CORONARY STENT INTERVENTION N/A 10/03/2019   Procedure: CORONARY STENT INTERVENTION;  Surgeon: Yolonda Kida, MD;  Location: [facility] CV LAB;  Service: Cardiovascular;  Laterality: N/A;  . ESOPHAGOGASTRODUODENOSCOPY    . ESOPHAGOGASTRODUODENOSCOPY (EGD) WITH PROPOFOL N/A 04/23/2019   Procedure: ESOPHAGOGASTRODUODENOSCOPY (EGD) WITH PROPOFOL;  Surgeon: Lollie Sails, MD;  Location: [facility] ENDOSCOPY;  Service: Endoscopy;  Laterality: N/A;  . JOINT REPLACEMENT    . 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 KNEE ARTHROSCOPY Right   . LEFT HEART CATH AND CORONARY ANGIOGRAPHY N/A 10/03/2019   Procedure: Left Heart Cath and possible Coronary intervention;  Surgeon: Dionisio David, MD;  Location: [facility] LAB;  Service: Cardiovascular;  Laterality: N/A;  . TONSILLECTOMY    . VISCERAL ANGIOGRAPHY N/A 05/23/2019   Procedure: VISCERAL ANGIOGRAPHY;  Surgeon: Algernon Huxley, MD;  Location: [facility] LAB;  Service: Cardiovascular;  Laterality: N/A;  . VISCERAL ANGIOGRAPHY N/A 11/04/2019   Procedure: VISCERAL ANGIOGRAPHY;  Surgeon: Algernon Huxley, MD;  Location: [facility] LAB;  Service: Cardiovascular;  Laterality: N/A;     Social History   Tobacco Use  . Smoking status: Never Smoker  . Smokeless tobacco: Never Used  Substance Use Topics  . Alcohol use: Yes    Comment: rare  . Drug use: No      Family History  Problem Relation Age of Onset  . Diabetes Mother   . Heart disease Mother   . Cancer Father   . COPD Father      Allergies  Allergen Reactions  . Penicillins Anaphylaxis    Has patient had a PCN reaction causing immediate rash, facial/tongue/throat swelling, SOB or lightheadedness with hypotension:  Yes Has patient had a PCN reaction causing severe rash involving mucus membranes or skin necrosis: No Has patient had a PCN reaction that required hospitalization Yes Has patient had a PCN reaction occurring within the last 10 years: No If all of the above answers are "NO", then may proceed with Cephalosporin use.  . Tavist [Clemastine] Swelling    REVIEW OF SYSTEMS(Negative unless checked)  Constitutional: [x] ???Weight loss [] ???Fever [] ???Chills Cardiac: [] ???Chest pain [] ???Chest pressure [] ???Palpitations [] ???Shortness of breath when laying flat [] ???Shortness of breath at rest [] ???Shortness  of breath with exertion. Vascular: [] ???Pain in legs with walking [] ???Pain in legs at rest [] ???Pain in legs when laying flat [] ???Claudication [] ???Pain in feet when walking [] ???Pain in feet at rest [] ???Pain in feet when laying flat [] ???History of DVT [] ???Phlebitis [] ???Swelling in legs [] ???Varicose veins [] ???Non-healing ulcers Pulmonary: [] ???Uses home oxygen [] ???Productive cough [] ???Hemoptysis [] ???Wheeze [] ???COPD [] ???Asthma Neurologic: [] ???Dizziness [] ???Blackouts [] ???Seizures [] ???History of stroke [] ???History of TIA [] ???Aphasia [] ???Temporary blindness [] ???Dysphagia [] ???Weakness or numbness in arms [] ???Weakness or numbness in legs Musculoskeletal: [x] ???Arthritis [] ???Joint swelling [x] ???Joint pain [] ???Low back pain Hematologic: [] ???Easy bruising [] ???Easy bleeding [] ???Hypercoagulable state [] ???Anemic  Gastrointestinal: [] ???Blood in stool [] ???Vomiting blood [x] ???Gastroesophageal reflux/heartburn [x] ???Abdominal pain Genitourinary: [] ???Chronic kidney disease [] ???Difficult urination [] ???Frequent urination [] ???Burning with urination [] ???Hematuria Skin: [] ???Rashes [] ???Ulcers [] ???Wounds Psychological: [] ???History of anxiety [] ???History of major depression.  Physical  Examination  BP (!) 200/67   Pulse 69   Ht 5\' 5"  (1.651 m)   Wt 192 lb (87.1 kg)   BMI 31.95 kg/m  Gen:  WD/WN, NAD Head: [place]/AT, No temporalis wasting. Ear/Nose/Throat: Hearing grossly intact, nares w/o erythema or drainage Eyes: Conjunctiva clear. Sclera non-icteric Neck: Supple.  Trachea midline Pulmonary:  Good air movement, no use of accessory muscles.  Cardiac: RRR, no JVD Vascular:  Vessel Right Left  Radial Palpable Palpable                                   Gastrointestinal: soft, non-tender/non-distended. No guarding/reflex.  Musculoskeletal: M/S 5/5 throughout.  No deformity or atrophy.  Mild lower extremity edema. Neurologic: Sensation grossly intact in extremities.  Symmetrical.  Speech is fluent.  Psychiatric: Judgment intact, Mood & affect appropriate for pt's clinical situation. Dermatologic: No rashes or ulcers noted.  No cellulitis or open wounds.       Labs Recent Results (from the past 2160 hour(s))  Glucose, capillary     Status: Abnormal   Collection Time: 10/02/19  1:07 PM  Result Value Ref Range   Glucose-Capillary 162 (H) 70 - 99 mg/dL  APTT     Status: None   Collection Time: 10/02/19  1:16 PM  Result Value Ref Range   aPTT 34 24 - 36 seconds    Comment: Performed at [facility], [address] [place], [place] [address]  Protime-INR     Status: None   Collection Time: 10/02/19  1:16 PM  Result Value Ref Range   Prothrombin Time 13.6 11.4 - 15.2 seconds   INR 1.1 0.8 - 1.2    Comment: (NOTE) INR goal varies based on device and disease states. Performed at [facility], [address] [place], [place] [address]   Glucose, capillary     Status: Abnormal   Collection Time: 10/02/19  4:44 PM  Result Value Ref Range   Glucose-Capillary 115 (H) 70 - 99 mg/dL  Glucose, capillary     Status: Abnormal   Collection Time: 10/02/19  8:49 PM  Result Value Ref Range   Glucose-Capillary 192 (H) 70 - 99 mg/dL  Heparin  level (unfractionated)     Status: None   Collection Time: 10/02/19  8:59 PM  Result Value Ref Range   Heparin Unfractionated 0.35 0.30 - 0.70 IU/mL    Comment: (NOTE) If heparin results are below expected values, and patient dosage has  been confirmed, suggest follow up testing of antithrombin III levels. Performed at [facility], [place], [address] [place], [place] [address]   Troponin  I (High Sensitivity)     Status: None   Collection Time: 10/03/19 12:08 AM  Result Value Ref Range   Troponin I (High Sensitivity) 12 <18 ng/L    Comment: (NOTE) Elevated high sensitivity troponin I (hsTnI) values and significant  changes across serial measurements may suggest ACS but many other  chronic and acute conditions are known to elevate hsTnI results.  Refer to the "Links" section for chest pain algorithms and additional  guidance. Performed at [facility], [address] [place], [place] [id number]   Basic metabolic panel     Status: Abnormal   Collection Time: 10/03/19  1:49 AM  Result Value Ref Range   Sodium 137 135 - 145 mmol/L   Potassium 4.3 3.5 - 5.1 mmol/L   Chloride 108 98 - 111 mmol/L   CO2 21 (L) 22 - 32 mmol/L   Glucose, Bld 164 (H) 70 - 99 mg/dL   BUN 31 (H) 8 - 23 mg/dL   Creatinine, Ser 1.45 (H) 0.61 - 1.24 mg/dL   Calcium 8.5 (L) 8.9 - 10.3 mg/dL   GFR calc non Af Amer 47 (L) >60 mL/min   GFR calc Af Amer 55 (L) >60 mL/min   Anion gap 8 5 - 15    Comment: Performed at [facility], [address] [place], [place] [address]  Magnesium     Status: None   Collection Time: 10/03/19  1:49 AM  Result Value Ref Range   Magnesium 2.0 1.7 - 2.4 mg/dL    Comment: Performed at [facility], [address] [place], [place] [address]  CBC     Status: Abnormal   Collection Time: 10/03/19  1:49 AM  Result Value Ref Range   WBC 6.6 4.0 - 10.5 K/uL   RBC 2.77 (L) 4.22 - 5.81 MIL/uL   Hemoglobin 6.7 (L) 13.0 - 17.0 g/dL   HCT 22.0 (L)  39.0 - 52.0 %   MCV 79.4 (L) 80.0 - 100.0 fL   MCH 24.2 (L) 26.0 - 34.0 pg   MCHC 30.5 30.0 - 36.0 g/dL   RDW 16.2 (H) 11.5 - 15.5 %   Platelets 221 150 - 400 K/uL   nRBC 0.0 0.0 - 0.2 %    Comment: Performed at [facility], [place], [place] [address]  Heparin level (unfractionated)     Status: None   Collection Time: 10/03/19  1:49 AM  Result Value Ref Range   Heparin Unfractionated 0.39 0.30 - 0.70 IU/mL    Comment: (NOTE) If heparin results are below expected values, and patient dosage has  been confirmed, suggest follow up testing of antithrombin III levels. Performed at [facility], [place], [place] [address]   Troponin I (High Sensitivity)     Status: None   Collection Time: 10/03/19  1:49 AM  Result Value Ref Range   Troponin I (High Sensitivity) 12 <18 ng/L    Comment: (NOTE) Elevated high sensitivity troponin I (hsTnI) values and significant  changes across serial measurements may suggest ACS but many other  chronic and acute conditions are known to elevate hsTnI results.  Refer to the "Links" section for chest pain algorithms and additional  guidance. Performed at [facility], [address] [place],  [address]   Glucose, capillary     Status: Abnormal   Collection Time: 10/03/19  7:18 AM  Result Value Ref Range   Glucose-Capillary 138 (H) 70 - 99 mg/dL  Prepare RBC     Status: None   Collection Time: 10/03/19  7:24 AM  Result Value Ref Range   Order Confirmation      ORDER PROCESSED BY BLOOD BANK Performed at [facility], [address] [place], [place] [address]   POCT Activated clotting time     Status: None   Collection Time: 10/03/19  8:31 AM  Result Value Ref Range   Activated Clotting Time 362 seconds  Occult blood card to lab, stool     Status: None   Collection Time: 10/03/19  9:24 AM  Result Value Ref Range   Fecal Occult Bld NEGATIVE NEGATIVE    Comment:  Performed at [facility], [address] [place], [place] [address]  Glucose, capillary     Status: Abnormal   Collection Time: 10/03/19  9:55 AM  Result Value Ref Range   Glucose-Capillary 158 (H) 70 - 99 mg/dL  Basic metabolic panel     Status: Abnormal   Collection Time: 10/03/19 11:25 AM  Result Value Ref Range   Sodium 136 135 - 145 mmol/L   Potassium 4.6 3.5 - 5.1 mmol/L   Chloride 107 98 - 111 mmol/L   CO2 23 22 - 32 mmol/L   Glucose, Bld 257 (H) 70 - 99 mg/dL   BUN 27 (H) 8 - 23 mg/dL   Creatinine, Ser 1.39 (H) 0.61 - 1.24 mg/dL   Calcium 8.2 (L) 8.9 - 10.3 mg/dL   GFR calc non Af Amer 50 (L) >60 mL/min   GFR calc Af Amer 57 (L) >60 mL/min   Anion gap 6 5 - 15    Comment: Performed at [facility], [address] [place], [place] [address]  Protime-INR     Status: Abnormal   Collection Time: 10/03/19 11:25 AM  Result Value Ref Range   Prothrombin Time 18.6 (H) 11.4 - 15.2 seconds   INR 1.6 (H) 0.8 - 1.2    Comment: (NOTE) INR goal varies based on device and disease states. Performed at [facility], [address] [place], [place] [address]   CBC     Status: Abnormal   Collection Time: 10/03/19 11:25 AM  Result Value Ref Range   WBC 5.6 4.0 - 10.5 K/uL   RBC 2.86 (L) 4.22 - 5.81 MIL/uL   Hemoglobin 6.9 (L) 13.0 - 17.0 g/dL   HCT 23.2 (L) 39.0 - 52.0 %   MCV 81.1 80.0 - 100.0 fL   MCH 24.1 (L) 26.0 - 34.0 pg   MCHC 29.7 (L) 30.0 - 36.0 g/dL   RDW 16.5 (H) 11.5 - 15.5 %   Platelets 240 150 - 400 K/uL   nRBC 0.0 0.0 - 0.2 %    Comment: Performed at [facility], [address] [place], [place] [address]  Vitamin B12     Status: None   Collection Time: 10/03/19 11:25 AM  Result Value Ref Range   Vitamin B-12 432 180 - 914 pg/mL    Comment: (NOTE) This assay is not validated for testing neonatal or myeloproliferative syndrome specimens for Vitamin B12 levels. Performed at [facility], [place] [address],  [place], [place] [address]   Folate     Status: None   Collection Time: 10/03/19 11:25 AM  Result Value Ref Range   Folate 12.8 >5.9 ng/mL    Comment: Performed at [facility], [place], [place] [address]  Iron and TIBC  Status: Abnormal   Collection Time: 10/03/19 11:25 AM  Result Value Ref Range   Iron 17 (L) 45 - 182 ug/dL   TIBC 391 250 - 450 ug/dL   Saturation Ratios 4 (L) 17.9 - 39.5 %   UIBC 374 ug/dL    Comment: Performed at [facility], [address] [place], [place] [address]  Ferritin     Status: Abnormal   Collection Time: 10/03/19 11:25 AM  Result Value Ref Range   Ferritin 9 (L) 24 - 336 ng/mL    Comment: Performed at [facility], [address] [place], [place] [address]  Reticulocytes     Status: Abnormal   Collection Time: 10/03/19 11:25 AM  Result Value Ref Range   Retic Ct Pct 1.2 0.4 - 3.1 %   RBC. 2.81 (L) 4.22 - 5.81 MIL/uL   Retic Count, Absolute 33.2 19.0 - 186.0 K/uL   Immature Retic Fract 14.8 2.3 - 15.9 %    Comment: REPEATED TO VERIFY Performed at [facility], [address] [place], [place] [address]   Type and screen [place]     Status: None   Collection Time: 10/03/19 11:25 AM  Result Value Ref Range   ABO/RH(D) O POS    Antibody Screen NEG    Sample Expiration 10/06/2019,2359    Unit Number [id number]    Blood Component Type RED CELLS,LR    Unit division 00    Status of Unit ISSUED,FINAL    Transfusion Status OK TO TRANSFUSE    Crossmatch Result Compatible    Unit Number [id number]    Blood Component Type RED CELLS,LR    Unit division 00    Status of Unit ISSUED,FINAL    Transfusion Status OK TO TRANSFUSE    Crossmatch Result Compatible    Unit Number [id number]    Blood Component Type RED CELLS,LR    Unit division 00    Status of Unit ISSUED,FINAL    Transfusion Status OK TO TRANSFUSE    Crossmatch Result      Compatible Performed at  [facility], [address] [place], [place] [address]   BPAM RBC     Status: None   Collection Time: 10/03/19 11:25 AM  Result Value Ref Range   ISSUE DATE / TIME [id number]    Blood Product Unit Number [id number]    PRODUCT CODE [id number]    Unit Type and Rh 9500    Blood Product Expiration Date 202101252359    ISSUE DATE / TIME 202101222025    Blood Product Unit Number [id number]    PRODUCT CODE E0382V00    Unit Type and Rh 9500    Blood Product Expiration Date 202101252359    ISSUE DATE / TIME [id number]    Blood Product Unit Number [id number]    PRODUCT CODE [id number]    Unit Type and Rh 5100    Blood Product Expiration Date [id number]   Glucose, capillary     Status: Abnormal   Collection Time: 10/03/19 11:40 AM  Result Value Ref Range   Glucose-Capillary 227 (H) 70 - 99 mg/dL  Glucose, capillary     Status: Abnormal   Collection Time: 10/03/19  4:30 PM  Result Value Ref Range   Glucose-Capillary 137 (H) 70 - 99 mg/dL  Hemoglobin and hematocrit, blood     Status: Abnormal   Collection Time: 10/03/19  7:14 PM  Result Value Ref Range   Hemoglobin 8.3 (L) 13.0 - 17.0 g/dL  HCT 26.9 (L) 39.0 - 52.0 %    Comment: Performed at [facility], [address] [place], [place] [address]  Glucose, capillary     Status: Abnormal   Collection Time: 10/03/19  9:01 PM  Result Value Ref Range   Glucose-Capillary 232 (H) 70 - 99 mg/dL  CBC     Status: Abnormal   Collection Time: 10/04/19  6:36 AM  Result Value Ref Range   WBC 7.6 4.0 - 10.5 K/uL   RBC 3.03 (L) 4.22 - 5.81 MIL/uL   Hemoglobin 7.6 (L) 13.0 - 17.0 g/dL   HCT 24.5 (L) 39.0 - 52.0 %   MCV 80.9 80.0 - 100.0 fL   MCH 25.1 (L) 26.0 - 34.0 pg   MCHC 31.0 30.0 - 36.0 g/dL   RDW 16.4 (H) 11.5 - 15.5 %   Platelets 224 150 - 400 K/uL   nRBC 0.0 0.0 - 0.2 %    Comment: Performed at [facility], [address] [place], [place] [id number]  Basic metabolic panel     Status:  Abnormal   Collection Time: 10/04/19  6:36 AM  Result Value Ref Range   Sodium 138 135 - 145 mmol/L   Potassium 4.4 3.5 - 5.1 mmol/L   Chloride 109 98 - 111 mmol/L   CO2 22 22 - 32 mmol/L   Glucose, Bld 180 (H) 70 - 99 mg/dL   BUN 31 (H) 8 - 23 mg/dL   Creatinine, Ser 1.63 (H) 0.61 - 1.24 mg/dL   Calcium 8.4 (L) 8.9 - 10.3 mg/dL   GFR calc non Af Amer 41 (L) >60 mL/min   GFR calc Af Amer 47 (L) >60 mL/min   Anion gap 7 5 - 15    Comment: Performed at [facility], [address] [place], [place] [address]  Glucose, capillary     Status: Abnormal   Collection Time: 10/04/19  8:00 AM  Result Value Ref Range   Glucose-Capillary 162 (H) 70 - 99 mg/dL  Glucose, capillary     Status: Abnormal   Collection Time: 10/04/19 11:33 AM  Result Value Ref Range   Glucose-Capillary 228 (H) 70 - 99 mg/dL  Glucose, capillary     Status: Abnormal   Collection Time: 10/04/19  4:58 PM  Result Value Ref Range   Glucose-Capillary 112 (H) 70 - 99 mg/dL  Sample to Blood Bank     Status: None   Collection Time: 10/04/19  6:43 PM  Result Value Ref Range   Blood Bank Specimen SAMPLE AVAILABLE FOR TESTING    Sample Expiration      10/07/2019,2359 Performed at [facility], [address] [place], [place] [address]   Glucose, capillary     Status: Abnormal   Collection Time: 10/04/19  9:11 PM  Result Value Ref Range   Glucose-Capillary 227 (H) 70 - 99 mg/dL  Glucose, capillary     Status: Abnormal   Collection Time: 10/05/19  8:08 AM  Result Value Ref Range   Glucose-Capillary 151 (H) 70 - 99 mg/dL  Hemoglobin and hematocrit, blood     Status: Abnormal   Collection Time: 10/05/19  9:20 AM  Result Value Ref Range   Hemoglobin 7.9 (L) 13.0 - 17.0 g/dL   HCT 24.9 (L) 39.0 - 52.0 %    Comment: Performed at [facility], [address], [place], [place] [address]  Prepare RBC     Status: None   Collection Time: 10/05/19 10:30 AM  Result Value Ref Range   Order  Confirmation      ORDER PROCESSED BY BLOOD BANK Performed at [facility], [address] [place], [place] [address]   Glucose, capillary     Status: Abnormal   Collection Time: 10/05/19 11:27 AM  Result Value Ref Range   Glucose-Capillary 127 (H) 70 - 99 mg/dL  Hemoglobin and hematocrit, blood     Status: Abnormal   Collection Time: 10/05/19  3:24 PM  Result Value Ref Range   Hemoglobin 9.4 (L) 13.0 - 17.0 g/dL   HCT 30.2 (L) 39.0 - 52.0 %    Comment: Performed at [facility], [address] [place], [place] [id number]  Basic metabolic panel     Status: Abnormal   Collection Time: 10/13/19  3:53 PM  Result Value Ref Range   Sodium 138 135 - 145 mmol/L   Potassium 5.3 (H) 3.5 - 5.1 mmol/L   Chloride 106 98 - 111 mmol/L   CO2 19 (L) 22 - 32 mmol/L   Glucose, Bld 155 (H) 70 - 99 mg/dL   BUN 42 (H) 8 - 23 mg/dL   Creatinine, Ser 2.21 (H) 0.61 - 1.24 mg/dL   Calcium 9.1 8.9 - 10.3 mg/dL   GFR calc non Af Amer 28 (L) >60 mL/min   GFR calc Af Amer 33 (L) >60 mL/min   Anion gap 13 5 - 15    Comment: Performed at [facility], [address] [place], [place] [address]  CBC     Status: Abnormal   Collection Time: 10/13/19  3:53 PM  Result Value Ref Range   WBC 7.5 4.0 - 10.5 K/uL   RBC 4.24 4.22 - 5.81 MIL/uL   Hemoglobin 11.4 (L) 13.0 - 17.0 g/dL   HCT 36.9 (L) 39.0 - 52.0 %   MCV 87.0 80.0 - 100.0 fL   MCH 26.9 26.0 - 34.0 pg   MCHC 30.9 30.0 - 36.0 g/dL   RDW 21.5 (H) 11.5 - 15.5 %   Platelets 368 150 - 400 K/uL   nRBC 0.0 0.0 - 0.2 %    Comment: Performed at [facility], [place], [place] [address]  Troponin I (High Sensitivity)     Status: None   Collection Time: 10/13/19  3:53 PM  Result Value Ref Range   Troponin I (High Sensitivity) 4 <18 ng/L    Comment: (NOTE) Elevated high sensitivity troponin I (hsTnI) values and significant  changes across serial measurements may suggest ACS but many other  chronic and  acute conditions are known to elevate hsTnI results.  Refer to the "Links" section for chest pain algorithms and additional  guidance. Performed at [facility], [place], [place] [address]   SARS CORONAVIRUS 2 (TAT 6-24 HRS) Nasopharyngeal Nasopharyngeal Swab     Status: None   Collection Time: 10/13/19  8:12 PM   Specimen: Nasopharyngeal Swab  Result Value Ref Range   SARS Coronavirus 2 NEGATIVE NEGATIVE    Comment: (NOTE) SARS-CoV-2 target nucleic acids are NOT DETECTED. The SARS-CoV-2 RNA is generally detectable in upper and lower respiratory specimens during the acute phase of infection. Negative results do not preclude SARS-CoV-2 infection, do not rule out co-infections with other pathogens, and should not be used as the sole basis for treatment or other patient management decisions. Negative results must be combined with clinical observations, patient history, and epidemiological information. The expected result is Negative. Fact Sheet for Patients:  SugarRoll.be Fact Sheet for Healthcare Providers: https://www.woods-mathews.com/ This test is not yet approved or cleared by the [place] FDA and  has been authorized for detection and/or diagnosis of SARS-CoV-2 by FDA under an Emergency Use Authorization (EUA). This EUA will remain  in effect (meaning this test can be used) for the duration of the COVID-19 declaration under Section 56 4(b)(1) of the Act, 21 U.S.C. section 360bbb-3(b)(1), unless the authorization is terminated or revoked sooner. Performed at [facility], [place] [address] [place], [place] [address]   Glucose, capillary     Status: Abnormal   Collection Time: 10/14/19  1:09 AM  Result Value Ref Range   Glucose-Capillary 123 (H) 70 - 99 mg/dL  Glucose, capillary     Status: Abnormal   Collection Time: 10/14/19  3:28 AM  Result Value Ref Range   Glucose-Capillary 115 (H) 70 - 99 mg/dL    Basic metabolic panel     Status: Abnormal   Collection Time: 10/14/19  5:51 AM  Result Value Ref Range   Sodium 138 135 - 145 mmol/L   Potassium 4.3 3.5 - 5.1 mmol/L   Chloride 110 98 - 111 mmol/L   CO2 19 (L) 22 - 32 mmol/L   Glucose, Bld 145 (H) 70 - 99 mg/dL   BUN 39 (H) 8 - 23 mg/dL   Creatinine, Ser 1.67 (H) 0.61 - 1.24 mg/dL   Calcium 8.3 (L) 8.9 - 10.3 mg/dL   GFR calc non Af Amer 40 (L) >60 mL/min   GFR calc Af Amer 46 (L) >60 mL/min   Anion gap 9 5 - 15    Comment: Performed at [facility], [address] [place], [place] [address]  Magnesium     Status: None   Collection Time: 10/14/19  5:51 AM  Result Value Ref Range   Magnesium 2.0 1.7 - 2.4 mg/dL    Comment: Performed at [facility], [address] [place], [place] [address]  Hemoglobin A1c     Status: Abnormal   Collection Time: 10/14/19  5:51 AM  Result Value Ref Range   Hgb A1c MFr Bld 6.8 (H) 4.8 - 5.6 %    Comment: (NOTE) Pre diabetes:          5.7%-6.4% Diabetes:              >6.4% Glycemic control for   <7.0% adults with diabetes    Mean Plasma Glucose 148.46 mg/dL    Comment: Performed at [facility], [place] [address] [place], [place] [address]  Glucose, capillary     Status: Abnormal   Collection Time: 10/14/19  8:13 AM  Result Value Ref Range   Glucose-Capillary 125 (H) 70 - 99 mg/dL  Glucose, capillary     Status: Abnormal   Collection Time: 10/14/19 12:10 PM  Result Value Ref Range   Glucose-Capillary 163 (H) 70 - 99 mg/dL  SARS CORONAVIRUS 2 (TAT 6-24 HRS) Nasopharyngeal Nasopharyngeal Swab     Status: None   Collection Time: 11/01/19 12:22 PM   Specimen: Nasopharyngeal Swab  Result Value Ref Range   SARS Coronavirus 2 NEGATIVE NEGATIVE    Comment: (NOTE) SARS-CoV-2 target nucleic acids are NOT DETECTED. The SARS-CoV-2 RNA is generally detectable in upper and lower respiratory specimens during the acute phase of infection. Negative results do not preclude  SARS-CoV-2 infection, do not rule out co-infections with other pathogens, and should not be used as the sole basis for treatment or other  patient management decisions. Negative results must be combined with clinical observations, patient history, and epidemiological information. The expected result is Negative. Fact Sheet for Patients: SugarRoll.be Fact Sheet for Healthcare Providers: https://www.woods-mathews.com/ This test is not yet approved or cleared by the [place] FDA and  has been authorized for detection and/or diagnosis of SARS-CoV-2 by FDA under an Emergency Use Authorization (EUA). This EUA will remain  in effect (meaning this test can be used) for the duration of the COVID-19 declaration under Section 56 4(b)(1) of the Act, 21 U.S.C. section 360bbb-3(b)(1), unless the authorization is terminated or revoked sooner. Performed at [facility], [place] [address] [place], [place] [address]   Glucose, capillary     Status: Abnormal   Collection Time: 11/04/19  7:55 AM  Result Value Ref Range   Glucose-Capillary 133 (H) 70 - 99 mg/dL  BUN     Status: Abnormal   Collection Time: 11/04/19  8:01 AM  Result Value Ref Range   BUN 29 (H) 8 - 23 mg/dL    Comment: Performed at [facility], [address] [place], [place] [address]  Creatinine, serum     Status: Abnormal   Collection Time: 11/04/19  8:01 AM  Result Value Ref Range   Creatinine, Ser 1.46 (H) 0.61 - 1.24 mg/dL   GFR calc non Af Amer 47 (L) >60 mL/min   GFR calc Af Amer 54 (L) >60 mL/min    Comment: Performed at [facility], [address], [place], [place] [address]    Radiology No results found.  Assessment/Plan HTN (hypertension), benign blood pressure control important in reducing the progression of atherosclerotic disease. On appropriate oral medications.   DM type 2 with diabetic peripheral neuropathy (HCC) blood glucose control  important in reducing the progression of atherosclerotic disease. Also, involved in wound healing. On appropriate medications.  HLD (hyperlipidemia) lipid control important in reducing the progression of atherosclerotic disease. Continue statin therapy   Chronic mesenteric ischemia (HCC) Duplex today shows the celiac velocities to be within normal limits.  His SMA velocities are elevated, but his previous angiogram did not show any high-grade SMA stenosis.  Overall doing well.  Continue current medical regimen.  Would plan to repeat duplex in 6 months or sooner if problems develop in the interim    [name], MD  12/31/2019 9:03 AM    This note was created with Dragon medical transcription system.  Any errors from dictation are purely unintentional

## 2020-01-01 ENCOUNTER — Other Ambulatory Visit: Payer: Self-pay | Admitting: *Deleted

## 2020-01-01 DIAGNOSIS — E113412 Type 2 diabetes mellitus with severe nonproliferative diabetic retinopathy with macular edema, left eye: Secondary | ICD-10-CM | POA: Diagnosis not present

## 2020-01-01 DIAGNOSIS — E11319 Type 2 diabetes mellitus with unspecified diabetic retinopathy without macular edema: Secondary | ICD-10-CM | POA: Insufficient documentation

## 2020-01-01 NOTE — Patient Outreach (Signed)
[facility]) Care Management  [place]  01/01/2020   Patrick Turner 01-26-1946 [id number]   Telephone [facility] Admissions 1/19-1/23/21 for Chest Pain, PCI stents to Mid and distal LAD, anemia.  1/31-10/14/19 for Acute Renal Failure, pseudoaneurysm right femoral artery.   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 Chronic mesenteric ischemia. Frequent Falls. peripheral neuropathy, 5th metatarsal fracture, Iron deficiency anemia.  11/04/19 Aortogram and selective angiogram of celiac and superior mesentric artery. Angioplasty of celiac artery    Subjective:  Successful outreach call to patient , he reports doing okay on today, currently resting in bed after visit at [facility] for steroid injection.   Patient discussed continues to have episodes of falls, recent fall in last 2 weeks, while going into work, assisted from up and able to continue short shifts at work. He reports having fall this am, while getting into car prior to eye appointment, he states falling onto his back, has scrap at elbow no other injury. He reports improvement in ankle discomfort and has follow up with Dr. Marry Guan on 4/23.He denies dizziness at time of falls.   Patient reports being pleased with improvement with recent A1c at 7.1. He states that his Ozempic insulin price has gone up to $699 for 3 month supply and Dr. Gabriel Carina office is working on getting assistance, he completed forms on yesterday. He discussed improved appetite and intake denies abdominal pain or nausea.  Patient reports awaiting referral appointment with [facility] after camera study for further follow up on spots noted on small intestines.     Encounter Medications:  Outpatient Encounter Medications as of 01/01/2020  Medication Sig  . ACCU-CHEK AVIVA PLUS test strip Use to test  blood sugar twice daily  . Accu-Chek Softclix Lancets lancets Use to test blood sugar twice daily  . acetaminophen (TYLENOL) 325 MG tablet Take 2 tablets (650 mg total) by mouth every 6 (six) hours as needed for mild pain (or Fever >/= 101).  Marland Kitchen aspirin 81 MG tablet Take 1 tablet (81 mg total) by mouth daily.  Marland Kitchen atorvastatin (LIPITOR) 80 MG tablet Take 80 mg by mouth at bedtime.  . clonazePAM (KLONOPIN) 1 MG tablet Take 0.5 mg by mouth at bedtime as needed for anxiety.   . clopidogrel (PLAVIX) 75 MG tablet Take 1 tablet (75 mg total) by mouth daily.  Marland Kitchen gabapentin (NEURONTIN) 400 MG capsule Take 800 mg by mouth at bedtime.   . insulin detemir (LEVEMIR) 100 UNIT/ML injection Inject 15 Units into the skin daily.  . iron polysaccharides (NIFEREX) 150 MG capsule Take 150 mg by mouth daily.  Marland Kitchen lisinopril (ZESTRIL) 20 MG tablet Take 20 mg by mouth daily.  . metFORMIN (GLUCOPHAGE-XR) 500 MG 24 hr tablet Take 500 mg by mouth 2 (two) times daily with a meal. 2 tablets twice daily with meals  . metoprolol tartrate (LOPRESSOR) 25 MG tablet Take 0.5 tablets (12.5 mg total) by mouth 2 (two) times daily. (Patient taking differently: Take 25 mg by mouth at bedtime. )  . Omega-3 Fatty Acids (FISH OIL) 1000 MG CAPS Take 1,000 mg by mouth 2 (two) times daily.   Marland Kitchen OZEMPIC, 0.25 OR 0.5 MG/DOSE, 2 MG/1.5ML SOPN Inject 1 Dose into the skin once a week. Wednesdays  . pantoprazole (PROTONIX) 40 MG tablet Take 40 mg by mouth daily.  Marland Kitchen PARoxetine (PAXIL) 40 MG tablet Take 40 mg by mouth  daily.   . pioglitazone (ACTOS) 15 MG tablet Take 15 mg by mouth daily.   . ranolazine (RANEXA) 500 MG 12 hr tablet Take 1 tablet by mouth 2 (two) times daily.  . vitamin B-12 (CYANOCOBALAMIN) 1000 MCG tablet Take 1,000 mcg by mouth daily.   No facility-administered encounter medications on file as of 01/01/2020.    Functional Status:  In your present state of health, do you have any difficulty performing the following activities:  11/04/2019 10/16/2019  Hearing? Y Y  Comment - wears hearing aide  Vision? N N  Difficulty concentrating or making decisions? N N  Walking or climbing stairs? Y Y  Comment - fall risk, uses cane/walker  Dressing or bathing? N N  Doing errands, shopping? - Scientist, forensic and eating ? - N  Using the Toilet? - N  In the past six months, have you accidently leaked urine? - N  Do you have problems with loss of bowel control? - N  Managing your Medications? - N  Managing your Finances? - N  Housekeeping or managing your Housekeeping? - Y  Comment - wife assist  Some recent data might be hidden    Fall/Depression Screening: Fall Risk  10/11/2019  Falls in the past year? 1  Number falls in past yr: 1  Injury with Fall? 0  Risk for fall due to : History of fall(s);Impaired balance/gait  Follow up Falls prevention discussed   PHQ 2/9 Scores 10/11/2019  PHQ - 2 Score 0    Assessment:   High fall risk. Patient continues to experience falls, follow up with orthopedic on this week. Continued reinforcement of fall prevention.   Diabetes  Improved A1c at 7.1, continues daily monitoring. Endocrinology office assistance with medication assistance on Oxempic , patient has one dose for this week.   Hypertension  Noted recent blood pressure elevation at medical appointment,report recent at home reading 140-160/ Continues to monitor daily and log , reinforced notifying MD of blood pressure elevation. Reviewed normal range .    Plan: Will plan follow up telephone visit in the next 2 weeks.  Reinforced notifying MD of fall episodes with concern for injury, fall precautions reinforced.  Will discuss plans of transition to disease management .    THN CM Care Plan Problem One     Most Recent Value  Care Plan Problem One  Patient with risk for readmission related to recent hospital admission for chest pain/stent placement   Role Documenting the Problem One  Care Management Telephonic  [place] for Problem One  Active  [facility] Long Term Goal   Patient will not experience a hospital admission over the next 60 days   THN Long Term Goal Met Date  01/01/20  [facility] CM Short Term Goal #2   Patient will be able to reports monitoring blood pressure dailly over the next 30 days and keeping record.   [facility]N CM Short Term Goal #2 Start Date  11/20/19  [facility] CM Short Term Goal #2 Met Date  01/01/20    [facility] CM Care Plan Problem Two     Most Recent Value  Care Plan Problem Two  Patient high risk for falls as evidenced by report of falls occurences   Role Documenting the Problem Two  Care Management [place] for Problem Two  Active  Interventions for Problem Two Long Term Goal   Advised patient notifying MD of fall occurence with concern injury for sooner follow up.  Discussed peripheral neuropathy and staying safe in preventing falls, wearing appropriate foot wear, think about  how to move in the safest way, before moving, use rolling walker  THN Long Term Goal  Over the next 90 days patient will be able to identify at least 2  measures to prevent falls  [goal date extended]  [facility] Long Term Goal Start Date  10/16/19  [facility] CM Short Term Goal #1   Over the next 30 days patient will be able to report decrease in fall occurences as evidenced by report .   THN CM Short Term Goal #1 Start Date  01/01/20 [facility] ]  Interventions for Short Term Goal #2   Discussed recent fall incidence, and prevention measures , changing posititions slowly, holding on when making movements to get into the car.   [facility]N CM Short Term Goal #2   Over the next 30 days patient will be able to verbalize improved strength and decrease discomfort in right knee area and foot   THN CM Short Term Goal #2 Start Date  12/04/19  Interventions for Short Term Goal #2  Discussed progress with knee and ankle discomfort, reinforced attending ortho appointments.     [facility] CM Care Plan Problem Three     Most  Recent Value  Care Plan Problem Three  Knowledge related to self care managemen of Diabetes with Alc at goal control   Role Documenting the Problem Three  Care Management Coordinator  Care Plan for Problem Three  Active  THN Long Term Goal   Over the next 90 days patient will be able to report maintaing Hemoglobin A1c at less than 8   THN Long Term Goal Start Date  01/01/20  Interventions for Problem Three Long Term Goal  Reviewed recent A1c results, reinforced steps to help maintain glucose control , taking medications a prescribed, balanced daily meals, keeping all MD appointments. Reinforced follow up with MD office on Ozempic assistance application.       Joylene Draft, RN, BSN  [place] Management Coordinator  [phone]- Mobile [phone]- Toll Free Main Office

## 2020-01-03 DIAGNOSIS — M170 Bilateral primary osteoarthritis of knee: Secondary | ICD-10-CM | POA: Diagnosis not present

## 2020-01-03 DIAGNOSIS — G629 Polyneuropathy, unspecified: Secondary | ICD-10-CM | POA: Diagnosis not present

## 2020-01-03 DIAGNOSIS — Z96653 Presence of artificial knee joint, bilateral: Secondary | ICD-10-CM | POA: Diagnosis not present

## 2020-01-04 DIAGNOSIS — M1712 Unilateral primary osteoarthritis, left knee: Secondary | ICD-10-CM | POA: Insufficient documentation

## 2020-01-07 DIAGNOSIS — I2510 Atherosclerotic heart disease of native coronary artery without angina pectoris: Secondary | ICD-10-CM | POA: Diagnosis not present

## 2020-01-07 DIAGNOSIS — E782 Mixed hyperlipidemia: Secondary | ICD-10-CM | POA: Diagnosis not present

## 2020-01-07 DIAGNOSIS — I10 Essential (primary) hypertension: Secondary | ICD-10-CM | POA: Diagnosis not present

## 2020-01-10 DIAGNOSIS — S92354D Nondisplaced fracture of fifth metatarsal bone, right foot, subsequent encounter for fracture with routine healing: Secondary | ICD-10-CM | POA: Diagnosis not present

## 2020-01-10 DIAGNOSIS — M79672 Pain in left foot: Secondary | ICD-10-CM | POA: Diagnosis not present

## 2020-01-10 DIAGNOSIS — H4312 Vitreous hemorrhage, left eye: Secondary | ICD-10-CM | POA: Diagnosis not present

## 2020-01-10 DIAGNOSIS — M65872 Other synovitis and tenosynovitis, left ankle and foot: Secondary | ICD-10-CM | POA: Diagnosis not present

## 2020-01-10 DIAGNOSIS — S93602A Unspecified sprain of left foot, initial encounter: Secondary | ICD-10-CM | POA: Diagnosis not present

## 2020-01-16 ENCOUNTER — Other Ambulatory Visit: Payer: Self-pay | Admitting: *Deleted

## 2020-01-16 NOTE — Patient Outreach (Signed)
[facility]) Care Management  [place]  01/16/2020   Patrick Turner 01-07-1946 [id number]   Telephone [facility] Admissions 1/19-1/23/21 for Chest Pain, PCI stents to Mid and distal LAD, anemia.  1/31-10/14/19 for Acute Renal Failure, pseudoaneurysm right femoral artery.   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 Chronic mesenteric ischemia.FrequentFalls. peripheral neuropathy, retinopathy with intravitreal injections, Right  5th metatarsal fracture, Iron deficiency anemia,  11/04/19 Aortogram and selective angiogram of celiac and superior mesentric artery. Angioplasty of celiac artery    Subjective:  1044 Unsuccessful outreach call to patient , able to leave a HIPAA compliant message for return call.      Plan Will schedule return call to patient in the next 4 business days.    Joylene Draft, RN, BSN  [place] Management Coordinator  [phone]- Mobile [phone]- Toll Free Main Office

## 2020-01-21 ENCOUNTER — Other Ambulatory Visit: Payer: Self-pay | Admitting: *Deleted

## 2020-01-21 NOTE — Patient Outreach (Signed)
[facility] [facility]) Care Management  [place]  01/21/2020   JERE PIONTEK 03-May-1946 [id number]  Subjective:  Unsuccessful outreach call to patient at preferred contact number,  no answer able to leave a HIPAA compliant message for return call.    Plan:  Will plan return call in the next 4 business days.  Will send unsuccessful outreach letter to patient active with  [facility] care management .   Joylene Draft, RN, BSN  [place] Management Coordinator  [phone]- Mobile [phone]- Toll Free Main Office

## 2020-01-22 DIAGNOSIS — F323 Major depressive disorder, single episode, severe with psychotic features: Secondary | ICD-10-CM | POA: Diagnosis not present

## 2020-01-23 ENCOUNTER — Other Ambulatory Visit: Payer: Self-pay | Admitting: *Deleted

## 2020-01-23 NOTE — Patient Outreach (Signed)
[facility]) Care Management  01/23/2020  Patrick Turner 09/17/1945 [id number]    Telephone [facility] Admissions 1/19-1/23/21 for Chest Pain, PCI stents to Mid and distal LAD, anemia.  1/31-10/14/19 for Acute Renal Failure, pseudoaneurysm right femoral artery.   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 Chronic mesenteric ischemia.FrequentFalls. peripheral neuropathy, retinopathy with intravitreal injections, Right  5th metatarsal fracture, Iron deficiency anemia,  11/04/19 Aortogram and selective angiogram of celiac and superior mesentric artery. Angioplasty of celiac artery     Subjective Unsuccessful outreach attempt to patient preferred contact number, no answer , able to leave a HIPAA compliant message for return call.    Plan If no return call , will schedule return call to patient in the next month. Unsuccessful outreach letter has been sent after 2nd unsuccessful call.    1130 Update.  Received incoming return call from patient, he states that he is doing good and  working on today, and request a call next week after reviewing his schedule.  Will plan return call in the next week at requested day/time for assessment of care management needs and possible transition to Disease Management program.   Unable to address care plan goals at this visit.    Joylene Draft, RN, BSN  [place] Management Coordinator  [phone]- Mobile [phone]- Toll Free Main Office

## 2020-01-30 DIAGNOSIS — H524 Presbyopia: Secondary | ICD-10-CM | POA: Diagnosis not present

## 2020-01-30 DIAGNOSIS — Z135 Encounter for screening for eye and ear disorders: Secondary | ICD-10-CM | POA: Diagnosis not present

## 2020-01-30 DIAGNOSIS — Z0100 Encounter for examination of eyes and vision without abnormal findings: Secondary | ICD-10-CM | POA: Diagnosis not present

## 2020-01-30 DIAGNOSIS — I10 Essential (primary) hypertension: Secondary | ICD-10-CM | POA: Diagnosis not present

## 2020-01-30 DIAGNOSIS — E7800 Pure hypercholesterolemia, unspecified: Secondary | ICD-10-CM | POA: Diagnosis not present

## 2020-01-30 DIAGNOSIS — E109 Type 1 diabetes mellitus without complications: Secondary | ICD-10-CM | POA: Diagnosis not present

## 2020-02-04 DIAGNOSIS — I10 Essential (primary) hypertension: Secondary | ICD-10-CM | POA: Diagnosis not present

## 2020-02-04 DIAGNOSIS — I2510 Atherosclerotic heart disease of native coronary artery without angina pectoris: Secondary | ICD-10-CM | POA: Diagnosis not present

## 2020-02-05 DIAGNOSIS — E113412 Type 2 diabetes mellitus with severe nonproliferative diabetic retinopathy with macular edema, left eye: Secondary | ICD-10-CM | POA: Diagnosis not present

## 2020-02-06 DIAGNOSIS — E782 Mixed hyperlipidemia: Secondary | ICD-10-CM | POA: Diagnosis not present

## 2020-02-06 DIAGNOSIS — G5791 Unspecified mononeuropathy of right lower limb: Secondary | ICD-10-CM | POA: Diagnosis not present

## 2020-02-06 DIAGNOSIS — I10 Essential (primary) hypertension: Secondary | ICD-10-CM | POA: Diagnosis not present

## 2020-02-06 DIAGNOSIS — E118 Type 2 diabetes mellitus with unspecified complications: Secondary | ICD-10-CM | POA: Diagnosis not present

## 2020-02-06 DIAGNOSIS — I2510 Atherosclerotic heart disease of native coronary artery without angina pectoris: Secondary | ICD-10-CM | POA: Diagnosis not present

## 2020-02-06 DIAGNOSIS — I509 Heart failure, unspecified: Secondary | ICD-10-CM | POA: Diagnosis not present

## 2020-02-11 DIAGNOSIS — D500 Iron deficiency anemia secondary to blood loss (chronic): Secondary | ICD-10-CM | POA: Diagnosis not present

## 2020-02-11 DIAGNOSIS — K6389 Other specified diseases of intestine: Secondary | ICD-10-CM | POA: Diagnosis not present

## 2020-02-11 DIAGNOSIS — I2510 Atherosclerotic heart disease of native coronary artery without angina pectoris: Secondary | ICD-10-CM | POA: Diagnosis not present

## 2020-02-12 ENCOUNTER — Other Ambulatory Visit: Payer: Self-pay | Admitting: *Deleted

## 2020-02-12 NOTE — Patient Outreach (Signed)
[facility]) Care Management  [place]  02/12/2020   Patrick Turner 06-14-1946 [id number]     Telephone [facility] Admissions 1/19-1/23/21 for Chest Pain, PCI stents to Mid and distal LAD, anemia.  1/31-10/14/19 for Acute Renal Failure, pseudoaneurysm right femoral artery.   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 Chronic mesenteric ischemia.FrequentFalls. peripheral neuropathy,retinopathy with intravitreal injections, Right5th metatarsal fracture, Iron deficiency anemia, 11/04/19 Aortogram and selective angiogram of celiac and superior mesentric artery. Angioplasty of celiac artery  Subjective:  Patient discussed feeling pretty good on today. He discussed dental visit on today. 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 He reports planned outpatient  procedure in July .  Patient denies abdominal pain, reports tolerating diet well . He reports that he continues to work about 24 to 40 hours a week.  He reports monitoring blood sugars daily , no low blood sugar reading of 70.   Patient reports some improvement with balance, denies having recent fall.   Encounter Medications:  Outpatient Encounter Medications as of 02/12/2020  Medication Sig  . ACCU-CHEK AVIVA PLUS test strip Use to test blood sugar twice daily  . Accu-Chek Softclix Lancets lancets Use to test blood sugar twice daily  . acetaminophen (TYLENOL) 325 MG tablet Take 2 tablets (650 mg total) by mouth every 6 (six) hours as needed for mild pain (or Fever >/= 101).  Marland Kitchen aspirin 81 MG tablet Take 1 tablet (81 mg total) by mouth daily.  Marland Kitchen atorvastatin (LIPITOR) 80 MG tablet Take 80 mg by mouth at bedtime.  . clonazePAM (KLONOPIN) 1 MG tablet Take 0.5 mg by mouth at bedtime as needed for  anxiety.   . clopidogrel (PLAVIX) 75 MG tablet Take 1 tablet (75 mg total) by mouth daily.  Marland Kitchen gabapentin (NEURONTIN) 400 MG capsule Take 800 mg by mouth at bedtime.   . insulin detemir (LEVEMIR) 100 UNIT/ML injection Inject 15 Units into the skin daily.  . iron polysaccharides (NIFEREX) 150 MG capsule Take 150 mg by mouth daily.  Marland Kitchen lisinopril (ZESTRIL) 20 MG tablet Take 20 mg by mouth daily.  . metFORMIN (GLUCOPHAGE-XR) 500 MG 24 hr tablet Take 500 mg by mouth 2 (two) times daily with a meal. 2 tablets twice daily with meals  . metoprolol tartrate (LOPRESSOR) 25 MG tablet Take 0.5 tablets (12.5 mg total) by mouth 2 (two) times daily. (Patient taking differently: Take 25 mg by mouth at bedtime. )  . Omega-3 Fatty Acids (FISH OIL) 1000 MG CAPS Take 1,000 mg by mouth 2 (two) times daily.   Marland Kitchen OZEMPIC, 0.25 OR 0.5 MG/DOSE, 2 MG/1.5ML SOPN Inject 1 Dose into the skin once a week. Wednesdays  . pantoprazole (PROTONIX) 40 MG tablet Take 40 mg by mouth daily.  Marland Kitchen PARoxetine (PAXIL) 40 MG tablet Take 40 mg by mouth daily.   . pioglitazone (ACTOS) 15 MG tablet Take 15 mg by mouth daily.   . ranolazine (RANEXA) 500 MG 12 hr tablet Take 1 tablet by mouth 2 (two) times daily.  . vitamin B-12 (CYANOCOBALAMIN) 1000 MCG tablet Take 1,000 mcg by mouth daily.   No facility-administered encounter medications on file as of 02/12/2020.    Functional Status:  In your present state of health, do you have any difficulty performing the following activities: 11/04/2019 10/16/2019  Hearing? Tempie Donning  Comment - wears hearing aide  Vision? N N  Difficulty concentrating or making decisions? N N  Walking or climbing stairs? Y Y  Comment - fall risk, uses cane/walker  Dressing or bathing? N N  Doing errands, shopping? - Scientist, forensic and eating ? - N  Using the Toilet? - N  In the past six months, have you accidently leaked urine? - N  Do you have problems with loss of bowel control? - N  Managing your Medications? - N   Managing your Finances? - N  Housekeeping or managing your Housekeeping? - Y  Comment - wife assist  Some recent data might be hidden    Fall/Depression Screening: Fall Risk  10/11/2019  Falls in the past year? 1  Number falls in past yr: 1  Injury with Fall? 0  Risk for fall due to : History of fall(s);Impaired balance/gait  Follow up Falls prevention discussed   PHQ 2/9 Scores 10/11/2019  PHQ - 2 Score 0    Assessment:   Diabetes Taking medications as prescribed,has received patient assistance with Ozempic per Endocrinology office. Denies having low blood sugar episodes.     High Fall Risk No reported falls in the last month, initial neurology appointment to follow up on neuropathy in next month  and orthopedic follow up in next week .   Discussed with patient continued follow up for disease management of chronic condition Diabetes.No identified care coordination needs at this time . Patient agreeable to continued follow up in the next month.   Plan:  Will transition to Disease Management for continued education and support Diabetes.  Will plan follow call next month per patient agreement after GI procedure in July.  Reinforced patient to contact RN care coordinator sooner if new concerns.   [facility] CM Care Plan Problem Two     Most Recent Value  Care Plan Problem Two  Patient high risk for falls as evidenced by report of falls occurences   Role Documenting the Problem Two  Care Management [place] for Problem Two  Active  [facility] Long Term Goal  Over the next 90 days patient will be able to identify at least 2  measures to prevent falls   [facility] Long Term Goal Start Date  10/16/19  [facility] Long Term Goal Met Date  02/12/20  [facility] CM Short Term Goal #1   Over the next 30 days patient will be able to report decrease in fall occurences as evidenced by report .   [facility] CM Short Term Goal #1 Start Date  01/01/20  [facility] CM Short Term Goal #1 Met Date   02/12/20  [facility] CM  Short Term Goal #2   Over the next 30 days patient will be able to verbalize improved strength and decrease discomfort in right knee area and foot   [facility] CM Short Term Goal #2 Start Date  02/12/20    [facility] CM Care Plan Problem Three     Most Recent Value  Care Plan Problem Three  Knowledge related to self care management of Diabetes with Alc at goal control   Role Documenting the Problem Three  Care Management [place] for Problem Three  Active  [facility] Long Term Goal   Over the next 90 days patient will be able to report maintaing Hemoglobin A1c at less than 8   [facility] Long Term Goal Start Date  02/12/20      Joylene Draft, RN, BSN  [facility]  Care Management,Care Management Coordinator  [phone] Mobile [phone]- Toll Free Main Office

## 2020-02-15 ENCOUNTER — Emergency Department: Payer: Medicare HMO

## 2020-02-15 ENCOUNTER — Emergency Department
Admission: EM | Admit: 2020-02-15 | Discharge: 2020-02-15 | Disposition: A | Discharge status: Home / Self Care | Payer: Medicare HMO | Attending: Emergency Medicine | Admitting: Emergency Medicine

## 2020-02-15 ENCOUNTER — Other Ambulatory Visit: Payer: Self-pay

## 2020-02-15 DIAGNOSIS — R531 Weakness: Secondary | ICD-10-CM | POA: Insufficient documentation

## 2020-02-15 DIAGNOSIS — I2510 Atherosclerotic heart disease of native coronary artery without angina pectoris: Secondary | ICD-10-CM | POA: Insufficient documentation

## 2020-02-15 DIAGNOSIS — R0989 Other specified symptoms and signs involving the circulatory and respiratory systems: Secondary | ICD-10-CM | POA: Diagnosis not present

## 2020-02-15 DIAGNOSIS — I10 Essential (primary) hypertension: Secondary | ICD-10-CM | POA: Diagnosis not present

## 2020-02-15 DIAGNOSIS — R519 Headache, unspecified: Secondary | ICD-10-CM | POA: Diagnosis not present

## 2020-02-15 DIAGNOSIS — Z955 Presence of coronary angioplasty implant and graft: Secondary | ICD-10-CM | POA: Insufficient documentation

## 2020-02-15 DIAGNOSIS — R42 Dizziness and giddiness: Secondary | ICD-10-CM | POA: Diagnosis not present

## 2020-02-15 DIAGNOSIS — Z794 Long term (current) use of insulin: Secondary | ICD-10-CM | POA: Diagnosis not present

## 2020-02-15 DIAGNOSIS — Z96651 Presence of right artificial knee joint: Secondary | ICD-10-CM | POA: Diagnosis not present

## 2020-02-15 DIAGNOSIS — E1140 Type 2 diabetes mellitus with diabetic neuropathy, unspecified: Secondary | ICD-10-CM | POA: Insufficient documentation

## 2020-02-15 DIAGNOSIS — R55 Syncope and collapse: Secondary | ICD-10-CM | POA: Diagnosis not present

## 2020-02-15 DIAGNOSIS — Z79899 Other long term (current) drug therapy: Secondary | ICD-10-CM | POA: Insufficient documentation

## 2020-02-15 LAB — URINALYSIS, COMPLETE (UACMP) WITH MICROSCOPIC
Bacteria, UA: NONE SEEN
Bilirubin Urine: NEGATIVE
Glucose, UA: NEGATIVE mg/dL
Hgb urine dipstick: NEGATIVE
Ketones, ur: NEGATIVE mg/dL
Leukocytes,Ua: NEGATIVE
Nitrite: NEGATIVE
Protein, ur: NEGATIVE mg/dL
Specific Gravity, Urine: 1.016 (ref 1.005–1.030)
Squamous Epithelial / HPF: NONE SEEN (ref 0–5)
pH: 5 (ref 5.0–8.0)

## 2020-02-15 LAB — CBC
HCT: 32.1 % — ABNORMAL LOW (ref 39.0–52.0)
Hemoglobin: 10.1 g/dL — ABNORMAL LOW (ref 13.0–17.0)
MCH: 29.7 pg (ref 26.0–34.0)
MCHC: 31.5 g/dL (ref 30.0–36.0)
MCV: 94.4 fL (ref 80.0–100.0)
Platelets: 261 10*3/uL (ref 150–400)
RBC: 3.4 MIL/uL — ABNORMAL LOW (ref 4.22–5.81)
RDW: 14.6 % (ref 11.5–15.5)
WBC: 5.8 10*3/uL (ref 4.0–10.5)
nRBC: 0 % (ref 0.0–0.2)

## 2020-02-15 LAB — BASIC METABOLIC PANEL
Anion gap: 8 (ref 5–15)
BUN: 44 mg/dL — ABNORMAL HIGH (ref 8–23)
CO2: 25 mmol/L (ref 22–32)
Calcium: 8.5 mg/dL — ABNORMAL LOW (ref 8.9–10.3)
Chloride: 105 mmol/L (ref 98–111)
Creatinine, Ser: 1.74 mg/dL — ABNORMAL HIGH (ref 0.61–1.24)
GFR calc Af Amer: 44 mL/min — ABNORMAL LOW (ref >60)
GFR calc non Af Amer: 38 mL/min — ABNORMAL LOW (ref >60)
Glucose, Bld: 161 mg/dL — ABNORMAL HIGH (ref 70–99)
Potassium: 5.2 mmol/L — ABNORMAL HIGH (ref 3.5–5.1)
Sodium: 138 mmol/L (ref 135–145)

## 2020-02-15 LAB — TROPONIN I (HIGH SENSITIVITY)
Troponin I (High Sensitivity): 4 ng/L (ref <18)
Troponin I (High Sensitivity): 4 ng/L (ref <18)

## 2020-02-15 MED ORDER — SODIUM CHLORIDE 0.9% FLUSH
3.0000 mL | Freq: Once | INTRAVENOUS | Status: AC
  Administered 2020-02-15: 3 mL via INTRAVENOUS

## 2020-02-15 MED ORDER — SODIUM CHLORIDE 0.9 % IV BOLUS
500.0000 mL | Freq: Once | INTRAVENOUS | Status: AC
  Administered 2020-02-15: 500 mL via INTRAVENOUS

## 2020-02-15 NOTE — ED Notes (Signed)
Pt states he is feeling better.  

## 2020-02-15 NOTE — ED Provider Notes (Signed)
[facility] Emergency Department Provider Note   ____________________________________________   I have reviewed the triage vital signs and the nursing notes.   HISTORY  Chief Complaint Dizziness   History limited by: Not Limited   HPI Patrick Turner is a 74 y.o. male who presents to the emergency department today because of concerns for near syncopal episode.  The patient states he was at work when he started to feel weak.  He started feeling out of it.  He noticed he was having a hard time concentrating at work.  He then sat down and felt like he was about to pass out.  He does not think he actually ever lost consciousness.  The patient states that he has had similar symptoms in the past.  States it happened every couple of years.  The patient denied any chest pain.  Denies any headache.   Records reviewed. Per medical record review patient has a history of CAD, anemia, DM.   Past Medical History:  Diagnosis Date  . Altered mental status   . Anemia   . Anxiety   . Aortic atherosclerosis ([place])   . Arthritis   . Coronary artery disease   . CRD (chronic renal disease)   . Depression   . Diabetes mellitus without complication ([place])   . Encephalopathy acute   . GERD (gastroesophageal reflux disease)   . Headache   . Hypercholesteremia   . Hypertension   . Neuropathy   . Severe obesity ([place])   . Sleep apnea   . Sleep terror    per patient this year per patient     Patient Active Problem List   Diagnosis Date Noted  . Diabetic retinopathy ([place]) 01/01/2020  . ASCVD (arteriosclerotic cardiovascular disease) 10/16/2019  . Hospitalization within last 30 days 10/16/2019  . Hyperkalemia 10/14/2019  . Pseudoaneurysm of right femoral artery ([place]) 10/14/2019  . AKI (acute kidney injury) ([place]) 10/13/2019  . Unstable angina ([place]) 10/02/2019  . Chest pain 10/01/2019  . Uncontrolled type 2 diabetes mellitus with hyperglycemia, with long-term current use  of insulin ([place]) 09/27/2019  . Chronic mesenteric ischemia ([place]) 05/06/2019  . Abdominal pain 05/03/2019  . Acute on chronic renal failure ([place]) 03/23/2019  . AMS (altered mental status) 12/24/2018  . Dizziness 06/30/2018  . HLD (hyperlipidemia) 02/23/2018  . Aortic atherosclerosis ([place]) 11/02/2017  . Healthcare maintenance 12/26/2016  . DM type 2 with diabetic peripheral neuropathy ([place]) 11/09/2016  . Presence of right artificial knee joint 08/14/2016  . Bilateral carotid artery disease ([place]) 07/12/2016  . 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 Acute diarrhea 05/30/2016  . Encephalopathy acute 05/30/2016  . Anemia 05/30/2016  . Acute on chronic renal insufficiency 05/30/2016  . Diarrhea 05/30/2016  . Depression, major, in remission ([place]) 08/02/2014  . Severe obesity (BMI 35.0-39.9) with comorbidity ([place]) 07/12/2014  . GERD (gastroesophageal reflux disease) 03/08/2014  . Hyperlipidemia associated with type 2 diabetes mellitus ([place]) 03/08/2014  . 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 Diabetes mellitus with stage 3 chronic kidney disease ([place]) 01/21/2014  . HTN (hypertension), benign 01/21/2014  . OSA (obstructive sleep apnea) 01/21/2014  . Unspecified transient cerebral ischemia 05/29/2012    Past Surgical History:  Procedure Laterality Date  . APPENDECTOMY    . CIRCUMCISION    . COLONOSCOPY    . COLONOSCOPY WITH PROPOFOL N/A 07/10/2018   Procedure: COLONOSCOPY WITH PROPOFOL;  Surgeon: Lollie Sails, MD;  Location: [facility] ENDOSCOPY;  Service: Endoscopy;  Laterality: N/A;  .  COLONOSCOPY WITH PROPOFOL N/A 04/23/2019   Procedure: COLONOSCOPY WITH PROPOFOL;  Surgeon: Lollie Sails, MD;  Location: [facility] ENDOSCOPY;  Service: Endoscopy;  Laterality: N/A;  . CORONARY STENT INTERVENTION N/A 10/03/2019   Procedure: CORONARY STENT INTERVENTION;  Surgeon: Yolonda Kida, MD;  Location: [place] CV LAB;  Service: Cardiovascular;  Laterality: N/A;  .  ESOPHAGOGASTRODUODENOSCOPY    . ESOPHAGOGASTRODUODENOSCOPY (EGD) WITH PROPOFOL N/A 04/23/2019   Procedure: ESOPHAGOGASTRODUODENOSCOPY (EGD) WITH PROPOFOL;  Surgeon: Lollie Sails, MD;  [facility] END[facility]COPY;  Service: Endoscopy;  Laterality: N/A;  . JOINT REPLACEMENT    . 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 KNEE ARTHROSCOPY Right   . LEFT HEART CATH AND CORONARY ANGIOGRAPHY N/A 10/03/2019   Procedure: Left Heart Cath and possible Coronary intervention;  Surgeon: Dionisio David, MD;  Location: [place] CV LAB;  Service: Cardiovascular;  Laterality: N/A;  . TONSILLECTOMY    . VISCERAL ANGIOGRAPHY N/A 05/23/2019   Procedure: VISCERAL ANGIOGRAPHY;  Surgeon: Algernon Huxley, MD;  Location: [place] CV LAB;  Service: Cardiovascular;  Laterality: N/A;  . VISCERAL ANGIOGRAPHY N/A 11/04/2019   Procedure: VISCERAL ANGIOGRAPHY;  Surgeon: Algernon Huxley, MD;  Location: [facility] CV LAB;  Service: Cardiovascular;  Laterality: N/A;    Prior to Admission medications   Medication Sig Start Date End Date Taking? Authorizing Provider  ACCU-CHEK AVIVA PLUS test strip Use to test blood sugar twice daily 10/13/19   [provider]  Accu-Chek Softclix Lancets lancets Use to test blood sugar twice daily 10/13/19   [provider]  acetaminophen (TYLENOL) 325 MG tablet Take 2 tablets (650 mg total) by mouth every 6 (six) hours as needed for mild pain (or Fever >/= 101). 10/14/19   Thornell Mule, MD  aspirin 81 MG tablet Take 1 tablet (81 mg total) by mouth daily. 02/23/18   Henreitta Leber, MD  atorvastatin (LIPITOR) 80 MG tablet Take 80 mg by mouth at bedtime. 12/14/15   [provider]  clonazePAM (KLONOPIN) 1 MG tablet Take 0.5 mg by mouth at bedtime as needed for anxiety.  12/18/15   [provider]  clopidogrel (PLAVIX)  75 MG tablet Take 1 tablet (75 mg total) by mouth daily. 09/09/19   Kris Hartmann, NP  gabapentin (NEURONTIN) 400 MG capsule Take 800 mg by mouth at bedtime.     [provider]  insulin detemir (LEVEMIR) 100 UNIT/ML injection Inject 15 Units into the skin daily. 03/04/19   [provider]  iron polysaccharides (NIFEREX) 150 MG capsule Take 150 mg by mouth daily.    [provider]  lisinopril (ZESTRIL) 20 MG tablet Take 20 mg by mouth daily.    [provider]  metFORMIN (GLUCOPHAGE-XR) 500 MG 24 hr tablet Take 500 mg by mouth 2 (two) times daily with a meal. 2 tablets twice daily with meals    [provider]  metoprolol tartrate (LOPRESSOR) 25 MG tablet Take 0.5 tablets (12.5 mg total) by mouth 2 (two) times daily. Patient taking differently: Take 25 mg by mouth at bedtime.  10/04/19   Para Skeans, MD  Omega-3 Fatty Acids (FISH OIL) 1000 MG CAPS Take 1,000 mg by mouth 2 (two) times daily.     [provider]  OZEMPIC, 0.25 OR 0.5 MG/DOSE, 2 MG/1.5ML SOPN Inject 1 Dose into the skin once  a week. Wednesdays 03/04/19   [provider]  pantoprazole (PROTONIX) 40 MG tablet Take 40 mg by mouth daily.    [provider]  PARoxetine (PAXIL) 40 MG tablet Take 40 mg by mouth daily.     [provider]  pioglitazone (ACTOS) 15 MG tablet Take 15 mg by mouth daily.     [provider]  ranolazine (RANEXA) 500 MG 12 hr tablet Take 1 tablet by mouth 2 (two) times daily. 10/07/19   [provider]  vitamin B-12 (CYANOCOBALAMIN) 1000 MCG tablet Take 1,000 mcg by mouth daily.    [provider]    Allergies Penicillins and Tavist [clemastine]  Family History  Problem Relation Age of Onset  . Diabetes Mother   . Heart disease Mother   . Cancer Father   . COPD Father     Social History Social History   Tobacco Use  . Smoking status: Never Smoker  . Smokeless tobacco: Never Used  Substance Use  Topics  . Alcohol use: Yes    Comment: rare  . Drug use: No    Review of Systems Constitutional: No fever/chills Eyes: No visual changes. ENT: No sore throat. Cardiovascular: Denies chest pain. Respiratory: Denies shortness of breath. Gastrointestinal: No abdominal pain.  No nausea, no vomiting.  No diarrhea.   Genitourinary: Negative for dysuria. Musculoskeletal: Negative for back pain. Skin: Negative for rash. Neurological: Positive for near syncope.  ____________________________________________   PHYSICAL EXAM:  VITAL SIGNS: ED Triage Vitals  Enc Vitals Group     BP 02/15/20 1730 (!) 161/60     Pulse Rate 02/15/20 1730 71     Resp 02/15/20 1730 14     Temp 02/15/20 1751 97.8 F (36.6 C)     Temp Source 02/15/20 1751 Oral     SpO2 02/15/20 1730 95 %     Weight 02/15/20 1752 180 lb (81.6 kg)     Height 02/15/20 1752 5\' 5"  (1.651 m)     Head Circumference --      Peak Flow --      Pain Score 02/15/20 1752 0   Constitutional: Alert and oriented.  Eyes: Conjunctivae are normal.  ENT      Head: Normocephalic and atraumatic.      Nose: No congestion/rhinnorhea.      Mouth/Throat: Mucous membranes are moist.      Neck: No stridor. Hematological/Lymphatic/Immunilogical: No cervical lymphadenopathy. Cardiovascular: Normal rate, regular rhythm.  No murmurs, rubs, or gallops.  Respiratory: Normal respiratory effort without tachypnea nor retractions. Breath sounds are clear and equal bilaterally. No wheezes/rales/rhonchi. Gastrointestinal: Soft and non tender. No rebound. No guarding.  Genitourinary: Deferred Musculoskeletal: Normal range of motion in all extremities. No lower extremity edema. Neurologic:  Normal speech and language. No gross focal neurologic deficits are appreciated.  Skin:  Skin is warm, dry and intact. No rash noted. Psychiatric: Mood and affect are normal. Speech and behavior are normal. Patient exhibits appropriate insight and  judgment.  ____________________________________________    LABS (pertinent positives/negatives)  UA clear, unremarkable Trop hs 4 x 2 BMP na 138, k 5.2, glu 161, cr 1.74 CBC wbc 5.8, hgb 10.1, plt 261  ____________________________________________   EKG  I, Nance Pear, attending physician, personally viewed and interpreted this EKG  EKG Time: 1729 Rate: 71 Rhythm: sinus rhythm Axis: normal Intervals: qtc 441 QRS: narrow, low voltage precordial leads ST changes: no st elevation Impression: abnormal ekg   ____________________________________________    RADIOLOGY  CT head  No acute abnormality  ____________________________________________   PROCEDURES  Procedures  ____________________________________________   INITIAL IMPRESSION / ASSESSMENT AND PLAN / ED COURSE  Pertinent labs & imaging results that were available during my care of the patient were reviewed by me and considered in my medical decision making (see chart for details).   Patient presented to the emergency department today because of concerns for a near syncopal episode while at work.  By the time my exam the patient did appear improved.  Blood work here without concerning electrolyte abnormalities, anemia.  Troponin was negative x2.  Patient was given some IV fluids.  He then felt fine with ambulation.  He felt comfortable going home.  At this point doubt cardiac etiology.  Unsure if patient had a slight drop in his glucose or perhaps a vasovagal episode.  Will discharge.  Will have patient follow-up with primary care. ____________________________________________   FINAL CLINICAL IMPRESSION(S) / ED DIAGNOSES  Final diagnoses:  Near syncope     Note: This dictation was prepared with Dragon dictation. Any transcriptional errors that result from this process are unintentional     Nance Pear, MD 02/15/20 2112

## 2020-02-15 NOTE — ED Triage Notes (Signed)
Pt arrives via ems from work, pt c/o dizziness, states that this sometimes happens, states that he is feeling a little better and states that he drank orange juice prior to calling ems and states that sometimes his sugar drops  Pt states that this happened a year ago and his sugar was 14.  Pt states that he has generalized weakness all over   Pt assessed with neuro checks and found to have equal hand grips, bilat facial symmetry, and without decreased sensation to facial features

## 2020-02-15 NOTE — ED Notes (Signed)
Pt transported to CT ?

## 2020-02-15 NOTE — ED Notes (Signed)
Pt states coming in after felling dizzy at work. Pt states he is feeling better and that the right foot pain is chronic pain.

## 2020-02-15 NOTE — ED Notes (Signed)
This nurse tech and [place] assisted pt with ambulation. Pt sat on the side of the bed prior to ambulation. Pt stated he felt "wobbly". Pt sat on side of the bed by himself for a few seconds. During ambulation, pt stated he felt a little dizzy but not bad. Pt ambulated back to room 3 and sat on side of the bed. Pt hooked back up to monitor.

## 2020-02-15 NOTE — Discharge Instructions (Addendum)
Please seek medical attention for any high fevers, chest pain, shortness of breath, change in behavior, persistent vomiting, bloody stool or any other new or concerning symptoms.  

## 2020-02-18 ENCOUNTER — Ambulatory Visit (INDEPENDENT_AMBULATORY_CARE_PROVIDER_SITE_OTHER): Payer: Medicare HMO | Admitting: Vascular Surgery

## 2020-02-18 ENCOUNTER — Other Ambulatory Visit: Payer: Self-pay

## 2020-02-18 ENCOUNTER — Encounter (INDEPENDENT_AMBULATORY_CARE_PROVIDER_SITE_OTHER): Payer: Self-pay | Admitting: Vascular Surgery

## 2020-02-18 ENCOUNTER — Ambulatory Visit (INDEPENDENT_AMBULATORY_CARE_PROVIDER_SITE_OTHER): Payer: Medicare HMO

## 2020-02-18 VITALS — BP 110/60 | HR 68 | Resp 16 | Wt 191.8 lb

## 2020-02-18 DIAGNOSIS — E785 Hyperlipidemia, unspecified: Secondary | ICD-10-CM | POA: Diagnosis not present

## 2020-02-18 DIAGNOSIS — R200 Anesthesia of skin: Secondary | ICD-10-CM

## 2020-02-18 DIAGNOSIS — E1142 Type 2 diabetes mellitus with diabetic polyneuropathy: Secondary | ICD-10-CM | POA: Diagnosis not present

## 2020-02-18 DIAGNOSIS — K551 Chronic vascular disorders of intestine: Secondary | ICD-10-CM

## 2020-02-18 DIAGNOSIS — I10 Essential (primary) hypertension: Secondary | ICD-10-CM

## 2020-02-18 DIAGNOSIS — R202 Paresthesia of skin: Secondary | ICD-10-CM | POA: Diagnosis not present

## 2020-02-18 NOTE — Progress Notes (Signed)
MRN : [id number]  Patrick Turner is a 74 y.o. (09-Nov-1945) male who presents with chief complaint of  Chief Complaint  Patient presents with   Follow-up    ultrasound follow up  .  History of Present Illness: Patient returns today in follow up of his chronic mesenteric ischemia.  His biggest complaint currently is of pain and numbness in both feet and lower legs.  He is scheduled to see a neurologist next week.  He does not describe typical claudication symptoms but his walking has become quite limited.  He does have atherosclerosis in multiple other areas and a history of atherosclerosis and we discussed the possibility that poor perfusion could be playing a role in this. As for his abdominal symptoms, they are improved after intervention earlier this year.  His weight has been stable and he is eating well.  Postprandial abdominal pain at this point is fairly minimal. Duplex today shows normal velocities in the SMA and IMA without hemodynamically significant stenosis.  He has mildly elevated velocities in the celiac artery just above what they were on his previous study and may suggest some moderate stenosis but certainly not anything high-grade  Current Outpatient Medications  Medication Sig Dispense Refill   ACCU-CHEK AVIVA PLUS test strip Use to test blood sugar twice daily     Accu-Chek Softclix Lancets lancets Use to test blood sugar twice daily     acetaminophen (TYLENOL) 325 MG tablet Take 2 tablets (650 mg total) by mouth every 6 (six) hours as needed for mild pain (or Fever >/= 101). 50 tablet 0   aspirin 81 MG tablet Take 1 tablet (81 mg total) by mouth daily.     atorvastatin (LIPITOR) 80 MG tablet Take 80 mg by mouth at bedtime.     clonazePAM (KLONOPIN) 1 MG tablet Take 0.5 mg by mouth at bedtime as needed for anxiety.   2   gabapentin (NEURONTIN) 400 MG capsule Take 800 mg by mouth at bedtime.      hydrALAZINE (APRESOLINE) 25 MG tablet Take 25 mg by mouth 2 (two)  times daily.     insulin detemir (LEVEMIR) 100 UNIT/ML injection Inject 15 Units into the skin daily.     iron polysaccharides (NIFEREX) 150 MG capsule Take 150 mg by mouth daily.     lisinopril (ZESTRIL) 20 MG tablet Take 20 mg by mouth daily.     metFORMIN (GLUCOPHAGE-XR) 500 MG 24 hr tablet Take 500 mg by mouth 2 (two) times daily with a meal. 2 tablets twice daily with meals     metoprolol tartrate (LOPRESSOR) 25 MG tablet Take 0.5 tablets (12.5 mg total) by mouth 2 (two) times daily. (Patient taking differently: Take 25 mg by mouth at bedtime. ) 60 tablet 0   Omega-3 Fatty Acids (FISH OIL) 1000 MG CAPS Take 1,000 mg by mouth 2 (two) times daily.      OZEMPIC, 0.25 OR 0.5 MG/DOSE, 2 MG/1.5ML SOPN Inject 1 Dose into the skin once a week. Wednesdays     pantoprazole (PROTONIX) 40 MG tablet Take 40 mg by mouth daily.     PARoxetine (PAXIL) 40 MG tablet Take 40 mg by mouth daily.      pioglitazone (ACTOS) 15 MG tablet Take 15 mg by mouth daily.      QUEtiapine (SEROQUEL) 100 MG tablet Take 100 mg by mouth at bedtime.     ranolazine (RANEXA) 500 MG 12 hr tablet Take 1 tablet by mouth 2 (two) times daily.  vitamin B-12 (CYANOCOBALAMIN) 1000 MCG tablet Take 1,000 mcg by mouth daily.     clopidogrel (PLAVIX) 75 MG tablet Take 1 tablet (75 mg total) by mouth daily. (Patient not taking: Reported on 02/18/2020) 30 tablet 11   No current facility-administered medications for this visit.    Past Medical History:  Diagnosis Date   Altered mental status    Anemia    Anxiety    Aortic atherosclerosis (HCC)    Arthritis    Coronary artery disease    CRD (chronic renal disease)    Depression    Diabetes mellitus without complication (HCC)    Encephalopathy acute    GERD (gastroesophageal reflux disease)    Headache    Hypercholesteremia    Hypertension    Neuropathy    Severe obesity ([place])    Sleep apnea    Sleep terror    per patient this year per patient       Past Surgical History:  Procedure Laterality Date   APPENDECTOMY     CIRCUMCISION     COLONOSCOPY     COLONOSCOPY WITH PROPOFOL N/A 07/10/2018   Procedure: COLONOSCOPY WITH PROPOFOL;  Surgeon: Lollie Sails, MD;  Location: [facility] ENDOSCOPY;  Service: Endoscopy;  Laterality: N/A;   COLONOSCOPY WITH PROPOFOL N/A 04/23/2019   Procedure: COLONOSCOPY WITH PROPOFOL;  Surgeon: Lollie Sails, MD;  Location: [facility] ENDOSCOPY;  Service: Endoscopy;  Laterality: N/A;   CORONARY STENT INTERVENTION N/A 10/03/2019   Procedure: CORONARY STENT INTERVENTION;  Surgeon: Yolonda Kida, MD;  Location: [place] [facility];  Service: Cardiovascular;  Laterality: N/A;   ESOPHAGOGASTRODUODENOSCOPY     ESOPHAGOGASTRODUODENOSCOPY (EGD) WITH PROPOFOL N/A 04/23/2019   Procedure: ESOPHAGOGASTRODUODENOSCOPY (EGD) WITH PROPOFOL;  Surgeon: Lollie Sails, MD;  Location: [facility] ENDOSCOPY;  Service: Endoscopy;  Laterality: N/A;   JOINT REPLACEMENT     KNEE ARTHROPLASTY Right 06/27/2016   Procedure: COMPUTER ASSISTED TOTAL KNEE ARTHROPLASTY;  Surgeon: Dereck Leep, MD;  Location: [facility] ORS;  Service: Orthopedics;  Laterality: Right;   KNEE ARTHROSCOPY Right    LEFT HEART CATH AND CORONARY ANGIOGRAPHY N/A 10/03/2019   Procedure: Left Heart Cath and possible Coronary intervention;  Surgeon: Dionisio David, MD;  Location: [place] [facility];  Service: Cardiovascular;  Laterality: N/A;   TONSILLECTOMY     VISCERAL ANGIOGRAPHY N/A 05/23/2019   Procedure: VISCERAL ANGIOGRAPHY;  Surgeon: Algernon Huxley, MD;  Location: [place] [facility];  Service: Cardiovascular;  Laterality: N/A;   VISCERAL ANGIOGRAPHY N/A 11/04/2019   Procedure: VISCERAL ANGIOGRAPHY;  Surgeon: Algernon Huxley, MD;  Location: [place] [facility];  Service: Cardiovascular;  Laterality: N/A;     Social History   Tobacco Use   Smoking status: Never Smoker   Smokeless tobacco: Never Used  Substance Use Topics   Alcohol use: Yes     Comment: rare   Drug use: No     Family History  Problem Relation Age of Onset   Diabetes Mother    Heart disease Mother    Cancer Father    COPD Father     Allergies  Allergen Reactions   Penicillins Anaphylaxis    Has patient had a PCN reaction causing immediate rash, facial/tongue/throat swelling, SOB or lightheadedness with hypotension: Yes Has patient had a PCN reaction causing severe rash involving mucus membranes or skin necrosis: No Has patient had a PCN reaction that required hospitalization Yes Has patient had a PCN reaction occurring within the last 10 years: No If  all of the above answers are "NO", then may proceed with Cephalosporin use.   Tavist [Clemastine] Swelling    REVIEW OF SYSTEMS(Negative unless checked)  Constitutional: [x] ????Weight loss [] ????Fever [] ????Chills Cardiac: [] ????Chest pain [] ????Chest pressure [] ????Palpitations [] ????Shortness of breath when laying flat [] ????Shortness of breath at rest [] ????Shortness of breath with exertion. Vascular: [] ????Pain in legs with walking [] ????Pain in legs at rest [] ????Pain in legs when laying flat [] ????Claudication [] ????Pain in feet when walking [] ????Pain in feet at rest [] ????Pain in feet when laying flat [] ????History of DVT [] ????Phlebitis [] ????Swelling in legs [] ????Varicose veins [] ????Non-healing ulcers Pulmonary: [] ????Uses home oxygen [] ????Productive cough [] ????Hemoptysis [] ????Wheeze [] ????COPD [] ????Asthma Neurologic: [] ????Dizziness [] ????Blackouts [] ????Seizures [] ????History of stroke [] ????History of TIA [] ????Aphasia [] ????Temporary blindness [] ????Dysphagia [] ????Weakness or numbness in arms [] ????Weakness or numbness in legs Musculoskeletal: [x] ????Arthritis [] ????Joint swelling [x] ????Joint pain [] ????Low back pain Hematologic: [] ????Easy bruising [] ????Easy bleeding [] ????Hypercoagulable state  [] ????Anemic  Gastrointestinal: [] ????Blood in stool [] ????Vomiting blood [x] ????Gastroesophageal reflux/heartburn [x] ????Abdominal pain Genitourinary: [] ????Chronic kidney disease [] ????Difficult urination [] ????Frequent urination [] ????Burning with urination [] ????Hematuria Skin: [] ????Rashes [] ????Ulcers [] ????Wounds Psychological: [] ????History of anxiety [] ????History of major depression.  Physical Examination  BP 110/60 (BP Location: Right Arm)    Pulse 68    Resp 16    Wt 191 lb 12.8 oz (87 kg)    BMI 31.92 kg/m  Gen:  WD/WN, NAD Head: [place]/AT, No temporalis wasting. Ear/Nose/Throat: Hearing grossly intact, nares w/o erythema or drainage Eyes: Conjunctiva clear. Sclera non-icteric Neck: Supple.  Trachea midline Pulmonary:  Good air movement, no use of accessory muscles.  Cardiac: RRR, no JVD Vascular:  Vessel Right Left  Radial Palpable Palpable                          PT 1+ Palpable Trace Palpable  DP 1+ Palpable 2+ Palpable   Gastrointestinal: soft, non-tender/non-distended. No guarding/reflex.  Musculoskeletal: M/S 5/5 throughout.  No deformity or atrophy. Mild BLE edema. Neurologic: Sensation grossly intact in extremities.  Symmetrical.  Speech is fluent.  Psychiatric: Judgment intact, Mood & affect appropriate for pt's clinical situation. Dermatologic: No rashes or ulcers noted.  No cellulitis or open wounds.       Labs Recent Results (from the past 2160 hour(s))  Basic metabolic panel     Status: Abnormal   Collection Time: 02/15/20  5:47 PM  Result Value Ref Range   Sodium 138 135 - 145 mmol/L   Potassium 5.2 (H) 3.5 - 5.1 mmol/L   Chloride 105 98 - 111 mmol/L   CO2 25 22 - 32 mmol/L   Glucose, Bld 161 (H) 70 - 99 mg/dL    Comment: Glucose reference range applies only to samples taken after fasting for at least 8 hours.   BUN 44 (H) 8 - 23 mg/dL   Creatinine, Ser 1.74 (H) 0.61 - 1.24 mg/dL   Calcium 8.5 (L) 8.9 - 10.3 mg/dL    GFR calc non Af Amer 38 (L) >60 mL/min   GFR calc Af Amer 44 (L) >60 mL/min   Anion gap 8 5 - 15    Comment: Performed at [facility], [address] [place], [place] [address]  CBC     Status: Abnormal   Collection Time: 02/15/20  5:47 PM  Result Value Ref Range   WBC 5.8 4.0 - 10.5 K/uL   RBC 3.40 (L) 4.22 - 5.81 MIL/uL   Hemoglobin 10.1 (L) 13.0 - 17.0 g/dL   HCT 32.1 (L) 39.0 - 52.0 %   MCV 94.4  80.0 - 100.0 fL   MCH 29.7 26.0 - 34.0 pg   MCHC 31.5 30.0 - 36.0 g/dL   RDW 14.6 11.5 - 15.5 %   Platelets 261 150 - 400 K/uL   nRBC 0.0 0.0 - 0.2 %    Comment: Performed at [facility], [place], [place] [address]  Troponin I (High Sensitivity)     Status: None   Collection Time: 02/15/20  5:58 PM  Result Value Ref Range   Troponin I (High Sensitivity) 4 <18 ng/L    Comment: (NOTE) Elevated high sensitivity troponin I (hsTnI) values and significant  changes across serial measurements may suggest ACS but many other  chronic and acute conditions are known to elevate hsTnI results.  Refer to the "Links" section for chest pain algorithms and additional  guidance. Performed at [facility], [address] [place], [place] [address]   Urinalysis, Complete w Microscopic     Status: Abnormal   Collection Time: 02/15/20  6:38 PM  Result Value Ref Range   Color, Urine YELLOW (A) YELLOW   APPearance CLEAR (A) CLEAR   Specific Gravity, Urine 1.016 1.005 - 1.030   pH 5.0 5.0 - 8.0   Glucose, UA NEGATIVE NEGATIVE mg/dL   Hgb urine dipstick NEGATIVE NEGATIVE   Bilirubin Urine NEGATIVE NEGATIVE   Ketones, ur NEGATIVE NEGATIVE mg/dL   Protein, ur NEGATIVE NEGATIVE mg/dL   Nitrite NEGATIVE NEGATIVE   Leukocytes,Ua NEGATIVE NEGATIVE   RBC / HPF 0-5 0 - 5 RBC/hpf   WBC, UA 0-5 0 - 5 WBC/hpf   Bacteria, UA NONE SEEN NONE SEEN   Squamous Epithelial / LPF NONE SEEN 0 - 5   Mucus PRESENT     Comment: Performed at [facility], [place], [place] [address]  Troponin I (High Sensitivity)     Status: None   Collection Time: 02/15/20  8:16 PM  Result Value Ref Range   Troponin I (High Sensitivity) 4 <18 ng/L    Comment: (NOTE) Elevated high sensitivity troponin I (hsTnI) values and significant  changes across serial measurements may suggest ACS but many other  chronic and acute conditions are known to elevate hsTnI results.  Refer to the "Links" section for chest pain algorithms and additional  guidance. Performed at [facility], [address] [place],  [address]     Radiology CT HEAD WO CONTRAST  Result Date: 02/15/2020 CLINICAL DATA:  Dizziness. EXAM: CT HEAD WITHOUT CONTRAST TECHNIQUE: Contiguous axial images were obtained from the base of the skull through the vertex without intravenous contrast. COMPARISON:  October 01, 2019 FINDINGS: Brain: There is mild cerebral atrophy with widening of the extra-axial spaces and ventricular dilatation. There are areas of decreased attenuation within the white matter tracts of the supratentorial brain, consistent with microvascular disease changes. Vascular: No hyperdense vessel or unexpected calcification. Skull: Normal. Negative for fracture or focal lesion. Sinuses/Orbits: No acute finding. Other: None. IMPRESSION: 1. Generalized cerebral atrophy. 2. No acute intracranial abnormality. Electronically Signed   By: Virgina Norfolk M.D.   On: 02/15/2020 18:29    Assessment/Plan HTN (hypertension), benign blood pressure control important in reducing the progression of atherosclerotic disease. On appropriate oral medications.   DM type 2 with diabetic peripheral neuropathy (HCC) blood glucose control important in reducing the progression of atherosclerotic disease. Also, involved in wound healing. On appropriate medications.  HLD (hyperlipidemia) lipid control important in reducing the progression of atherosclerotic disease. Continue statin  therapy  Numbness and tingling of both legs He is to see a neurologist as this has become very painful.  This is likely due to his diabetic neuropathy, but there could also be a component of vascular disease contributing.  He wants to see the neurologist first but we discussed checking ABIs and he will call our office he would like to have these done after seeing his neurologist.  Chronic mesenteric ischemia ([place]) Duplex today shows normal velocities in the SMA and IMA without hemodynamically significant stenosis.  He has mildly elevated velocities in the celiac artery just above what they were on his previous study and may suggest some moderate stenosis but certainly not anything high-grade.  Symptomatically he is doing better.  I will not plan any intervention or invasive testing at this time.  Plan follow-up in 6 months with duplex.    [name], MD  02/18/2020 9:47 AM    This note was created with Dragon medical transcription system.  Any errors from dictation are purely unintentional

## 2020-02-18 NOTE — Assessment & Plan Note (Signed)
Duplex today shows normal velocities in the SMA and IMA without hemodynamically significant stenosis.  He has mildly elevated velocities in the celiac artery just above what they were on his previous study and may suggest some moderate stenosis but certainly not anything high-grade.  Symptomatically he is doing better.  I will not plan any intervention or invasive testing at this time.  Plan follow-up in 6 months with duplex.

## 2020-02-18 NOTE — Assessment & Plan Note (Signed)
He is to see a neurologist as this has become very painful.  This is likely due to his diabetic neuropathy, but there could also be a component of vascular disease contributing.  He wants to see the neurologist first but we discussed checking ABIs and he will call our office he would like to have these done after seeing his neurologist.

## 2020-02-21 DIAGNOSIS — K6389 Other specified diseases of intestine: Secondary | ICD-10-CM | POA: Diagnosis not present

## 2020-02-24 ENCOUNTER — Telehealth (INDEPENDENT_AMBULATORY_CARE_PROVIDER_SITE_OTHER): Payer: Self-pay | Admitting: Vascular Surgery

## 2020-02-24 ENCOUNTER — Other Ambulatory Visit (INDEPENDENT_AMBULATORY_CARE_PROVIDER_SITE_OTHER): Payer: Self-pay | Admitting: Nurse Practitioner

## 2020-02-24 NOTE — Telephone Encounter (Signed)
Called stating that he is experiencing severe pain in his left foot. He said that while he was here on his last visit that [place] said he would do a [place] on his foot. He would like to come in to be seen. Patient was last seen 02-18-20 w/ Mesenteric [place] (JD). Please advise.

## 2020-02-24 NOTE — Telephone Encounter (Signed)
In reviewing Dr. Bunnie Domino note it appears that it was discussed that the pain may be related to diabetic neuropathy over vascular issues.  It was recommended that he follow with neurology prior to an ultrasound of his leg.  We can bring him in for ABIs, however if his vascular studies are normal, he will need to follow up with his PCP/Neurologist for treatment of pain as we don't manage diabetic neuropathy.

## 2020-02-24 NOTE — Telephone Encounter (Signed)
Last seen 02/18/20. See note below.

## 2020-02-25 NOTE — Telephone Encounter (Signed)
Patient will be called to for an appt in our office with ABI's. See notes below.

## 2020-03-19 DIAGNOSIS — Z79899 Other long term (current) drug therapy: Secondary | ICD-10-CM | POA: Diagnosis not present

## 2020-03-19 DIAGNOSIS — I6523 Occlusion and stenosis of bilateral carotid arteries: Secondary | ICD-10-CM | POA: Diagnosis not present

## 2020-03-19 DIAGNOSIS — E538 Deficiency of other specified B group vitamins: Secondary | ICD-10-CM | POA: Diagnosis not present

## 2020-03-19 DIAGNOSIS — R200 Anesthesia of skin: Secondary | ICD-10-CM | POA: Diagnosis not present

## 2020-03-19 DIAGNOSIS — G2119 Other drug induced secondary parkinsonism: Secondary | ICD-10-CM | POA: Diagnosis not present

## 2020-03-19 DIAGNOSIS — G4733 Obstructive sleep apnea (adult) (pediatric): Secondary | ICD-10-CM | POA: Diagnosis not present

## 2020-03-19 DIAGNOSIS — E559 Vitamin D deficiency, unspecified: Secondary | ICD-10-CM | POA: Diagnosis not present

## 2020-03-19 DIAGNOSIS — F325 Major depressive disorder, single episode, in full remission: Secondary | ICD-10-CM | POA: Diagnosis not present

## 2020-03-19 DIAGNOSIS — R2689 Other abnormalities of gait and mobility: Secondary | ICD-10-CM | POA: Diagnosis not present

## 2020-03-19 DIAGNOSIS — R202 Paresthesia of skin: Secondary | ICD-10-CM | POA: Diagnosis not present

## 2020-03-20 DIAGNOSIS — I10 Essential (primary) hypertension: Secondary | ICD-10-CM | POA: Diagnosis not present

## 2020-03-20 DIAGNOSIS — E782 Mixed hyperlipidemia: Secondary | ICD-10-CM | POA: Diagnosis not present

## 2020-03-20 DIAGNOSIS — R55 Syncope and collapse: Secondary | ICD-10-CM | POA: Diagnosis not present

## 2020-03-20 DIAGNOSIS — I509 Heart failure, unspecified: Secondary | ICD-10-CM | POA: Diagnosis not present

## 2020-03-20 DIAGNOSIS — I2510 Atherosclerotic heart disease of native coronary artery without angina pectoris: Secondary | ICD-10-CM | POA: Diagnosis not present

## 2020-03-20 DIAGNOSIS — R609 Edema, unspecified: Secondary | ICD-10-CM | POA: Diagnosis not present

## 2020-03-21 ENCOUNTER — Emergency Department
Admission: EM | Admit: 2020-03-21 | Discharge: 2020-03-21 | Disposition: A | Discharge status: Home / Self Care | Payer: Medicare HMO | Attending: Emergency Medicine | Admitting: Emergency Medicine

## 2020-03-21 ENCOUNTER — Emergency Department: Payer: Medicare HMO

## 2020-03-21 ENCOUNTER — Other Ambulatory Visit: Payer: Self-pay

## 2020-03-21 ENCOUNTER — Encounter: Payer: Self-pay | Admitting: Intensive Care

## 2020-03-21 DIAGNOSIS — R55 Syncope and collapse: Secondary | ICD-10-CM

## 2020-03-21 DIAGNOSIS — E1122 Type 2 diabetes mellitus with diabetic chronic kidney disease: Secondary | ICD-10-CM | POA: Diagnosis not present

## 2020-03-21 DIAGNOSIS — E1140 Type 2 diabetes mellitus with diabetic neuropathy, unspecified: Secondary | ICD-10-CM | POA: Insufficient documentation

## 2020-03-21 DIAGNOSIS — R079 Chest pain, unspecified: Secondary | ICD-10-CM | POA: Diagnosis not present

## 2020-03-21 DIAGNOSIS — Z794 Long term (current) use of insulin: Secondary | ICD-10-CM | POA: Insufficient documentation

## 2020-03-21 DIAGNOSIS — Z7902 Long term (current) use of antithrombotics/antiplatelets: Secondary | ICD-10-CM | POA: Diagnosis not present

## 2020-03-21 DIAGNOSIS — R0789 Other chest pain: Secondary | ICD-10-CM

## 2020-03-21 DIAGNOSIS — I129 Hypertensive chronic kidney disease with stage 1 through stage 4 chronic kidney disease, or unspecified chronic kidney disease: Secondary | ICD-10-CM | POA: Diagnosis not present

## 2020-03-21 DIAGNOSIS — E1142 Type 2 diabetes mellitus with diabetic polyneuropathy: Secondary | ICD-10-CM | POA: Insufficient documentation

## 2020-03-21 DIAGNOSIS — Z7982 Long term (current) use of aspirin: Secondary | ICD-10-CM | POA: Insufficient documentation

## 2020-03-21 DIAGNOSIS — Z955 Presence of coronary angioplasty implant and graft: Secondary | ICD-10-CM | POA: Insufficient documentation

## 2020-03-21 DIAGNOSIS — N183 Chronic kidney disease, stage 3 unspecified: Secondary | ICD-10-CM | POA: Diagnosis not present

## 2020-03-21 DIAGNOSIS — I2510 Atherosclerotic heart disease of native coronary artery without angina pectoris: Secondary | ICD-10-CM | POA: Diagnosis not present

## 2020-03-21 DIAGNOSIS — Z79899 Other long term (current) drug therapy: Secondary | ICD-10-CM | POA: Diagnosis not present

## 2020-03-21 DIAGNOSIS — E875 Hyperkalemia: Secondary | ICD-10-CM

## 2020-03-21 LAB — CBC
HCT: 30.5 % — ABNORMAL LOW (ref 39.0–52.0)
Hemoglobin: 10.2 g/dL — ABNORMAL LOW (ref 13.0–17.0)
MCH: 30.1 pg (ref 26.0–34.0)
MCHC: 33.4 g/dL (ref 30.0–36.0)
MCV: 90 fL (ref 80.0–100.0)
Platelets: 220 10*3/uL (ref 150–400)
RBC: 3.39 MIL/uL — ABNORMAL LOW (ref 4.22–5.81)
RDW: 15.1 % (ref 11.5–15.5)
WBC: 6 10*3/uL (ref 4.0–10.5)
nRBC: 0 % (ref 0.0–0.2)

## 2020-03-21 LAB — URINALYSIS, COMPLETE (UACMP) WITH MICROSCOPIC
Bacteria, UA: NONE SEEN
Bilirubin Urine: NEGATIVE
Glucose, UA: NEGATIVE mg/dL
Hgb urine dipstick: NEGATIVE
Ketones, ur: NEGATIVE mg/dL
Leukocytes,Ua: NEGATIVE
Nitrite: NEGATIVE
Protein, ur: NEGATIVE mg/dL
Specific Gravity, Urine: 1.016 (ref 1.005–1.030)
Squamous Epithelial / HPF: NONE SEEN (ref 0–5)
pH: 6 (ref 5.0–8.0)

## 2020-03-21 LAB — BASIC METABOLIC PANEL
Anion gap: 9 (ref 5–15)
Anion gap: 9 (ref 5–15)
BUN: 42 mg/dL — ABNORMAL HIGH (ref 8–23)
BUN: 36 mg/dL — ABNORMAL HIGH (ref 8–23)
CO2: 21 mmol/L — ABNORMAL LOW (ref 22–32)
CO2: 19 mmol/L — ABNORMAL LOW (ref 22–32)
Calcium: 9 mg/dL (ref 8.9–10.3)
Calcium: 8.4 mg/dL — ABNORMAL LOW (ref 8.9–10.3)
Chloride: 108 mmol/L (ref 98–111)
Chloride: 111 mmol/L (ref 98–111)
Creatinine, Ser: 1.68 mg/dL — ABNORMAL HIGH (ref 0.61–1.24)
Creatinine, Ser: 1.4 mg/dL — ABNORMAL HIGH (ref 0.61–1.24)
GFR calc Af Amer: 46 mL/min — ABNORMAL LOW (ref >60)
GFR calc Af Amer: 57 mL/min — ABNORMAL LOW (ref >60)
GFR calc non Af Amer: 39 mL/min — ABNORMAL LOW (ref >60)
GFR calc non Af Amer: 49 mL/min — ABNORMAL LOW (ref >60)
Glucose, Bld: 168 mg/dL — ABNORMAL HIGH (ref 70–99)
Glucose, Bld: 112 mg/dL — ABNORMAL HIGH (ref 70–99)
Potassium: 5.8 mmol/L — ABNORMAL HIGH (ref 3.5–5.1)
Potassium: 5.2 mmol/L — ABNORMAL HIGH (ref 3.5–5.1)
Sodium: 138 mmol/L (ref 135–145)
Sodium: 139 mmol/L (ref 135–145)

## 2020-03-21 LAB — TROPONIN I (HIGH SENSITIVITY)
Troponin I (High Sensitivity): 4 ng/L (ref <18)
Troponin I (High Sensitivity): 5 ng/L (ref <18)

## 2020-03-21 MED ORDER — SODIUM CHLORIDE 0.9 % IV BOLUS
1000.0000 mL | Freq: Once | INTRAVENOUS | Status: AC
  Administered 2020-03-21: 1000 mL via INTRAVENOUS

## 2020-03-21 MED ORDER — PATIROMER SORBITEX CALCIUM 8.4 G PO PACK
8.4000 g | PACK | Freq: Once | ORAL | Status: AC
  Administered 2020-03-21: 8.4 g via ORAL
  Filled 2020-03-21: qty 1

## 2020-03-21 NOTE — ED Triage Notes (Addendum)
Patient c/o weakness, abd pain, and aching chest pains earlier today while at work. Patient reports he was at work when this started. HX of same. Reports he is suppose to have two areas on his small intestines biopsied soon.

## 2020-03-21 NOTE — ED Provider Notes (Signed)
[facility] Emergency Department Provider Note   ____________________________________________   First MD Initiated Contact with Patient 03/21/20 1500     (approximate)  I have reviewed the triage vital signs and the nursing notes.   HISTORY  Chief Complaint Chest Pain, Weakness, and Abdominal Pain    HPI Patrick Turner is a 74 y.o. male with past medical history of hypertension, diabetes, CAD, CKD, and SMA stenosis status post stent presents to the ED complaining of chest pain and weakness.  Patient states that he was working at [employer] earlier today when he started feeling hot and weak all over with dull aching pain in the center of his chest.  He felt somewhat lightheaded but did not pass out, does report some brief crampy pain in bilateral quadrants of his abdomen.  All of his pain has since resolved and he now feels back to normal, has not had any further chest pain or abdominal pain.  He states he has had similar episodes in the past with no clear diagnosis.  He states he is otherwise been well recently with no fevers, cough, shortness of breath, vomiting, diarrhea, dysuria, or hematuria.        Past Medical History:  Diagnosis Date  . Altered mental status   . Anemia   . Anxiety   . Aortic atherosclerosis ([place])   . Arthritis   . Coronary artery disease   . CRD (chronic renal disease)   . Depression   . Diabetes mellitus without complication ([place])   . Encephalopathy acute   . GERD (gastroesophageal reflux disease)   . Headache   . Hypercholesteremia   . Hypertension   . Neuropathy   . Severe obesity ([place])   . Sleep apnea   . Sleep terror    per patient this year per patient     Patient Active Problem List   Diagnosis Date Noted  . Numbness and tingling of both legs 02/18/2020  . Diabetic retinopathy ([place]) 01/01/2020  . ASCVD (arteriosclerotic cardiovascular disease) 10/16/2019  . Hospitalization within last 30 days  10/16/2019  . Hyperkalemia 10/14/2019  . Pseudoaneurysm of right femoral artery ([place]) 10/14/2019  . AKI (acute kidney injury) ([place]) 10/13/2019  . Unstable angina ([place]) 10/02/2019  . Chest pain 10/01/2019  . Uncontrolled type 2 diabetes mellitus with hyperglycemia, with long-term current use of insulin ([place]) 09/27/2019  . Chronic mesenteric ischemia ([place]) 05/06/2019  . Abdominal pain 05/03/2019  . Acute on chronic renal failure ([place]) 03/23/2019  . AMS (altered mental status) 12/24/2018  . Dizziness 06/30/2018  . HLD (hyperlipidemia) 02/23/2018  . Aortic atherosclerosis ([place]) 11/02/2017  . Healthcare maintenance 12/26/2016  . DM type 2 with diabetic peripheral neuropathy ([place]) 11/09/2016  . Presence of right artificial knee joint 08/14/2016  . Bilateral carotid artery disease ([place]) 07/12/2016  . 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 Acute diarrhea 05/30/2016  . Encephalopathy acute 05/30/2016  . Anemia 05/30/2016  . Acute on chronic renal insufficiency 05/30/2016  . Diarrhea 05/30/2016  . Depression, major, in remission ([place]) 08/02/2014  . Severe obesity (BMI 35.0-39.9) with comorbidity ([place]) 07/12/2014  . GERD (gastroesophageal reflux disease) 03/08/2014  . Hyperlipidemia associated with type 2 diabetes mellitus ([place]) 03/08/2014  . 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 Diabetes mellitus with stage 3 chronic kidney disease ([place]) 01/21/2014  . HTN (hypertension), benign 01/21/2014  . OSA (obstructive sleep apnea) 01/21/2014  . Unspecified transient cerebral ischemia 05/29/2012    Past Surgical History:  Procedure Laterality Date  . APPENDECTOMY    . CIRCUMCISION    . COLONOSCOPY    . COLONOSCOPY WITH PROPOFOL N/A 07/10/2018   Procedure: COLONOSCOPY WITH PROPOFOL;  Surgeon: Lollie Sails, MD;  Location: [facility] ENDOSCOPY;  Service: Endoscopy;  Laterality: N/A;  . COLONOSCOPY WITH PROPOFOL N/A 04/23/2019   Procedure: COLONOSCOPY WITH PROPOFOL;  Surgeon: Lollie Sails, MD;  Location: [facility] ENDOSCOPY;  Service: Endoscopy;  Laterality: N/A;  . CORONARY STENT INTERVENTION N/A 10/03/2019   Procedure: CORONARY STENT INTERVENTION;  Surgeon: Yolonda Kida, MD;  Location: [place] CV LAB;  Service: Cardiovascular;  Laterality: N/A;  . ESOPHAGOGASTRODUODENOSCOPY    . ESOPHAGOGASTRODUODENOSCOPY (EGD) WITH PROPOFOL N/A 04/23/2019   Procedure: ESOPHAGOGASTRODUODENOSCOPY (EGD) WITH PROPOFOL;  Surgeon: Lollie Sails, MD;  Location: [facility] ENDOSCOPY;  Service: Endoscopy;  Laterality: N/A;  . JOINT REPLACEMENT    . 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 KNEE ARTHROSCOPY Right   . LEFT HEART CATH AND CORONARY ANGIOGRAPHY N/A 10/03/2019   Procedure: Left Heart Cath and possible Coronary intervention;  Surgeon: Dionisio David, MD;  Location: [place] CV LAB;  Service: Cardiovascular;  Laterality: N/A;  . TONSILLECTOMY    . VISCERAL ANGIOGRAPHY N/A 05/23/2019   Procedure: VISCERAL ANGIOGRAPHY;  Surgeon: Algernon Huxley, MD;  Location: [place] CV LAB;  Service: Cardiovascular;  Laterality: N/A;  . VISCERAL ANGIOGRAPHY N/A 11/04/2019   Procedure: VISCERAL ANGIOGRAPHY;  Surgeon: Algernon Huxley, MD;  Location: [place] CV LAB;  Service: Cardiovascular;  Laterality: N/A;    Prior to Admission medications   Medication Sig Start Date End Date Taking? Authorizing Provider  ACCU-CHEK AVIVA PLUS test strip Use to test blood sugar twice daily 10/13/19   [provider]  Accu-Chek Softclix Lancets lancets Use to test blood sugar twice daily 10/13/19   [provider]  acetaminophen (TYLENOL) 325 MG tablet Take 2 tablets (650 mg total) by mouth every 6 (six) hours as needed for mild pain (or Fever >/= 101). 10/14/19   Thornell Mule, MD  aspirin 81 MG tablet Take 1 tablet (81 mg total) by mouth daily.  02/23/18   Henreitta Leber, MD  atorvastatin (LIPITOR) 80 MG tablet Take 80 mg by mouth at bedtime. 12/14/15   [provider]  clonazePAM (KLONOPIN) 1 MG tablet Take 0.5 mg by mouth at bedtime as needed for anxiety.  12/18/15   [provider]  clopidogrel (PLAVIX) 75 MG tablet Take 1 tablet (75 mg total) by mouth daily. Patient not taking: Reported on 02/18/2020 09/09/19   Kris Hartmann, NP  gabapentin (NEURONTIN) 400 MG capsule Take 800 mg by mouth at bedtime.     [provider]  hydrALAZINE (APRESOLINE) 25 MG tablet Take 25 mg by mouth 2 (two) times daily. 01/08/20   [provider]  insulin detemir (LEVEMIR) 100 UNIT/ML injection Inject 15 Units into the skin daily. 03/04/19   [provider]  iron polysaccharides (NIFEREX) 150 MG capsule Take 150 mg by mouth daily.    [provider]  lisinopril (ZESTRIL) 20 MG tablet Take 20 mg by mouth daily.    [provider]  metFORMIN (GLUCOPHAGE-XR) 500 MG 24 hr tablet Take 500 mg by mouth 2 (two) times daily with a meal. 2 tablets twice daily with meals    [provider]  metoprolol  tartrate (LOPRESSOR) 25 MG tablet Take 0.5 tablets (12.5 mg total) by mouth 2 (two) times daily. Patient taking differently: Take 25 mg by mouth at bedtime.  10/04/19   Para Skeans, MD  Omega-3 Fatty Acids (FISH OIL) 1000 MG CAPS Take 1,000 mg by mouth 2 (two) times daily.     [provider]  OZEMPIC, 0.25 OR 0.5 MG/DOSE, 2 MG/1.5ML SOPN Inject 1 Dose into the skin once a week. Wednesdays 03/04/19   [provider]  pantoprazole (PROTONIX) 40 MG tablet Take 40 mg by mouth daily.    [provider]  PARoxetine (PAXIL) 40 MG tablet Take 40 mg by mouth daily.     [provider]  pioglitazone (ACTOS) 15 MG tablet Take 15 mg by mouth daily.     [provider]  QUEtiapine (SEROQUEL) 100 MG tablet Take 100 mg by mouth at bedtime. 01/25/20   [provider]    ranolazine (RANEXA) 500 MG 12 hr tablet Take 1 tablet by mouth 2 (two) times daily. 10/07/19   [provider]  vitamin B-12 (CYANOCOBALAMIN) 1000 MCG tablet Take 1,000 mcg by mouth daily.    [provider]    Allergies Penicillins and Tavist [clemastine]  Family History  Problem Relation Age of Onset  . Diabetes Mother   . Heart disease Mother   . Cancer Father   . COPD Father     Social History Social History   Tobacco Use  . Smoking status: Never Smoker  . Smokeless tobacco: Never Used  Vaping Use  . Vaping Use: Never used  Substance Use Topics  . Alcohol use: Yes    Comment: rare  . Drug use: No    Review of Systems  Constitutional: No fever/chills.  Positive for lightheadedness and generalized weakness. Eyes: No visual changes. ENT: No sore throat. Cardiovascular: Positive for chest pain. Respiratory: Denies shortness of breath. Gastrointestinal: Positive for abdominal pain.  No nausea, no vomiting.  No diarrhea.  No constipation. Genitourinary: Negative for dysuria. Musculoskeletal: Negative for back pain. Skin: Negative for rash. Neurological: Negative for headaches, focal weakness or numbness.  ____________________________________________   PHYSICAL EXAM:  VITAL SIGNS: ED Triage Vitals  Enc Vitals Group     BP 03/21/20 1115 (!) 158/62     Pulse Rate 03/21/20 1115 72     Resp 03/21/20 1115 18     Temp 03/21/20 1115 97.6 F (36.4 C)     Temp Source 03/21/20 1115 Oral     SpO2 03/21/20 1115 96 %     Weight 03/21/20 1116 180 lb (81.6 kg)     Height 03/21/20 1116 5\' 5"  (1.651 m)     Head Circumference --      Peak Flow --      Pain Score 03/21/20 1148 0     Pain Loc --      Pain Edu? --      Excl. in [place]? --     Constitutional: Alert and oriented. Eyes: Conjunctivae are normal. Head: Atraumatic. Nose: No congestion/rhinnorhea. Mouth/Throat: Mucous membranes are moist. Neck: Normal ROM Cardiovascular: Normal rate, regular  rhythm. Grossly normal heart sounds. Respiratory: Normal respiratory effort.  No retractions. Lungs CTAB. Gastrointestinal: Soft and nontender. No distention. Genitourinary: deferred Musculoskeletal: No lower extremity tenderness nor edema. Neurologic:  Normal speech and language. No gross focal neurologic deficits are appreciated. Skin:  Skin is warm, dry and intact. No rash noted. Psychiatric: Mood and affect are normal. Speech and behavior are  normal.  ____________________________________________   LABS (all labs ordered are listed, but only abnormal results are displayed)  Labs Reviewed  BASIC METABOLIC PANEL - Abnormal; Notable for the following components:      Result Value   Potassium 5.8 (*)    CO2 21 (*)    Glucose, Bld 168 (*)    BUN 42 (*)    Creatinine, Ser 1.68 (*)    GFR calc non Af Amer 39 (*)    GFR calc Af Amer 46 (*)    All other components within normal limits  CBC - Abnormal; Notable for the following components:   RBC 3.39 (*)    Hemoglobin 10.2 (*)    HCT 30.5 (*)    All other components within normal limits  URINALYSIS, COMPLETE (UACMP) WITH MICROSCOPIC - Abnormal; Notable for the following components:   Color, Urine YELLOW (*)    APPearance CLEAR (*)    All other components within normal limits  BASIC METABOLIC PANEL - Abnormal; Notable for the following components:   Potassium 5.2 (*)    CO2 19 (*)    Glucose, Bld 112 (*)    BUN 36 (*)    Creatinine, Ser 1.40 (*)    Calcium 8.4 (*)    GFR calc non Af Amer 49 (*)    GFR calc Af Amer 57 (*)    All other components within normal limits  TROPONIN I (HIGH SENSITIVITY)  TROPONIN I (HIGH SENSITIVITY)   ____________________________________________  EKG  ED ECG REPORT I, Blake Divine, the attending physician, personally viewed and interpreted this ECG.   Date: 03/21/2020  EKG Time: 11:12  Rate: 80  Rhythm: normal sinus rhythm  Axis: Normal  Intervals:none  ST&T Change:  None   PROCEDURES  Procedure(s) performed (including Critical Care):  Procedures   ____________________________________________   INITIAL IMPRESSION / ASSESSMENT AND PLAN / ED COURSE       74 year old male with possible history of hypertension, diabetes, CAD, CKD, and SMA stenosis status post stent who presents to the ED following episode of lightheadedness, generalized weakness, chest pain, and abdominal pain.  All of his pain seems to have resolved and patient now states he feels back to normal.  EKG shows no evidence of arrhythmia or ischemia and initial troponin is negative.  I have a relatively low suspicion for cardiac etiology of his symptoms given his prior echocardiogram last year showed only mild diastolic dysfunction with normal systolic function, he also had cardiac catheterization performed earlier this year with successful stenting, no other apparent significant stenosis.  His lab work is concerning and that he has some mild hyperkalemia, no apparent EKG changes from this.  We will hydrate with IV fluids and recheck, his renal function appears relatively stable.  I doubt any acute mesenteric ischemia as his pain has completely resolved at this time.  If his potassium is improved on recheck and troponin remained stable, he would be appropriate for discharge home with cardiology follow-up.  Repeat troponin is stable and potassium improved to 5.2 on recheck.  Patient was able to stand up and ambulate without assistance and at this point he is appropriate for discharge home.  He was counseled to follow-up with his PCP within 1 week for recheck of his kidney function and potassium.  He was also counseled to follow-up with cardiology and to return to the ED for new or worsening symptoms.  Patient agrees with plan.      ____________________________________________   FINAL CLINICAL IMPRESSION(S) /  ED DIAGNOSES  Final diagnoses:  Near syncope  Atypical chest pain  Hyperkalemia      ED Discharge Orders    None       Note:  This document was prepared using Dragon voice recognition software and may include unintentional dictation errors.   Blake Divine, MD 03/21/20 2103

## 2020-03-21 NOTE — Discharge Instructions (Signed)
The lab work today did not show any evidence of damage to your heart, however your potassium was found to be elevated.  This improved after you were given IV fluids, however you will need to have your potassium rechecked next week at your PCPs office.  Please schedule an appointment with your PCP and return to the ER if you have any worsening symptoms.

## 2020-03-23 ENCOUNTER — Other Ambulatory Visit: Payer: Self-pay | Admitting: *Deleted

## 2020-03-23 NOTE — Patient Outreach (Addendum)
[facility]) Care Management  03/23/2020  Patrick Turner 02/06/1946 [id number]   Telephone Assessment Follow ED visit on 03/21/20 Patient in [facility] Admissions 1/19-1/23/21 for Chest Pain, PCI stents to Mid and distal LAD, anemia.  1/31-10/14/19 for Acute Renal Failure, pseudoaneurysm right femoral artery.   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 Chronic mesenteric ischemia.FrequentFalls. peripheral neuropathy,retinopathy with intravitreal injections, Right5th metatarsal fracture, Iron deficiency anemia, 11/04/19 Aortogram and selective angiogram of celiac and superior mesentric artery. Angioplasty of celiac artery ED Visit : 03/21/20 at [facility] DX: Near Syncope, Chest pain, Hyperkalemia.   Subjective: Unsuccessful outreach call to patient , to follow up ED visit on 03/21/20.  No answer, unable to leave a message as mailbox is full.     Plan Will plan return call in the next 4 business days.    Joylene Draft, RN, BSN  [place] Management Coordinator  [phone]- Mobile [phone]- Toll Free Main Office

## 2020-03-24 DIAGNOSIS — I10 Essential (primary) hypertension: Secondary | ICD-10-CM | POA: Diagnosis not present

## 2020-03-24 DIAGNOSIS — E119 Type 2 diabetes mellitus without complications: Secondary | ICD-10-CM | POA: Diagnosis not present

## 2020-03-24 DIAGNOSIS — I509 Heart failure, unspecified: Secondary | ICD-10-CM | POA: Diagnosis not present

## 2020-03-24 DIAGNOSIS — I2510 Atherosclerotic heart disease of native coronary artery without angina pectoris: Secondary | ICD-10-CM | POA: Diagnosis not present

## 2020-03-24 DIAGNOSIS — E782 Mixed hyperlipidemia: Secondary | ICD-10-CM | POA: Diagnosis not present

## 2020-03-26 ENCOUNTER — Other Ambulatory Visit: Payer: Self-pay | Admitting: *Deleted

## 2020-03-26 DIAGNOSIS — Z96651 Presence of right artificial knee joint: Secondary | ICD-10-CM | POA: Diagnosis not present

## 2020-03-26 DIAGNOSIS — E1159 Type 2 diabetes mellitus with other circulatory complications: Secondary | ICD-10-CM | POA: Diagnosis not present

## 2020-03-26 DIAGNOSIS — G629 Polyneuropathy, unspecified: Secondary | ICD-10-CM | POA: Diagnosis not present

## 2020-03-26 NOTE — Patient Outreach (Signed)
[facility]) Care Management  [place]  03/26/2020   Patrick Turner 10-01-1945 [id number]  Subjective:   Telephone Assessment Follow ED visit on 03/21/20 Patient in [facility] Admissions 1/19-1/23/21 for Chest Pain, PCI stents to Mid and distal LAD, anemia.  1/31-10/14/19 for Acute Renal Failure, pseudoaneurysm right femoral artery.   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 Chronic mesenteric ischemia.FrequentFalls. peripheral neuropathy,retinopathy with intravitreal injections, Right5th metatarsal fracture, Iron deficiency anemia, 11/04/19 Aortogram and selective angiogram of celiac and superior mesentric artery. Angioplasty of celiac artery ED Visit : 03/21/20 at [facility] DX: Near Syncope, Chest pain, Hyperkalemia.   Subjective: Unsuccessful outreach call to patient , to follow up ED visit on 03/21/20.  No answer, unable to leave a message as mailbox is full.    Plan Will plan outreach call  on previously scheduled monthly call on 04/01/20.   Joylene Draft, RN, BSN  [place] Management Coordinator  [phone]- Mobile [phone]- Toll Free Main Office

## 2020-03-27 DIAGNOSIS — R55 Syncope and collapse: Secondary | ICD-10-CM | POA: Diagnosis not present

## 2020-03-31 ENCOUNTER — Other Ambulatory Visit (INDEPENDENT_AMBULATORY_CARE_PROVIDER_SITE_OTHER): Payer: Self-pay | Admitting: Nurse Practitioner

## 2020-03-31 DIAGNOSIS — M79672 Pain in left foot: Secondary | ICD-10-CM

## 2020-04-01 ENCOUNTER — Encounter (INDEPENDENT_AMBULATORY_CARE_PROVIDER_SITE_OTHER): Payer: Self-pay | Admitting: Nurse Practitioner

## 2020-04-01 ENCOUNTER — Other Ambulatory Visit: Payer: Self-pay

## 2020-04-01 ENCOUNTER — Ambulatory Visit (INDEPENDENT_AMBULATORY_CARE_PROVIDER_SITE_OTHER): Payer: Medicare HMO

## 2020-04-01 ENCOUNTER — Ambulatory Visit (INDEPENDENT_AMBULATORY_CARE_PROVIDER_SITE_OTHER): Payer: Medicare HMO | Admitting: Nurse Practitioner

## 2020-04-01 ENCOUNTER — Other Ambulatory Visit: Payer: Self-pay | Admitting: *Deleted

## 2020-04-01 VITALS — BP 131/68 | HR 63 | Resp 16 | Wt 180.0 lb

## 2020-04-01 DIAGNOSIS — I10 Essential (primary) hypertension: Secondary | ICD-10-CM

## 2020-04-01 DIAGNOSIS — M79672 Pain in left foot: Secondary | ICD-10-CM

## 2020-04-01 DIAGNOSIS — K551 Chronic vascular disorders of intestine: Secondary | ICD-10-CM | POA: Diagnosis not present

## 2020-04-01 DIAGNOSIS — R202 Paresthesia of skin: Secondary | ICD-10-CM | POA: Diagnosis not present

## 2020-04-01 DIAGNOSIS — R200 Anesthesia of skin: Secondary | ICD-10-CM

## 2020-04-01 NOTE — Patient Outreach (Signed)
[facility]) Care Management  [place]  04/01/2020   Patrick Turner [birth year] [id number]   Telephone Assessment  Disease Management Program  Recent Hospital Admissions 1/19-1/23/21 for Chest Pain, PCI stents to Mid and distal LAD, anemia.  1/31-10/14/19 for Acute Renal Failure, pseudoaneurysm right femoral artery.   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 Chronic mesenteric ischemia.FrequentFalls. peripheral neuropathy,retinopathy with intravitreal injections, Right5th metatarsal fracture, Iron deficiency anemia, 11/04/19 Aortogram and selective angiogram of celiac and superior mesentric artery. Angioplasty of celiac artery ED Visit : 03/21/20 at [facility] DX: Near Syncope, Chest pain, Hyperkalemia.   Subjective:  Unsuccessful outreach call to patient preferred contact number no answer able to leave a HIPAA compliant message for return call    Plan Will plan return call in the next month.    Joylene Draft, RN, BSN  [place] Management Coordinator  [phone]- Mobile [phone]- Toll Free Main Office

## 2020-04-02 DIAGNOSIS — Z9861 Coronary angioplasty status: Secondary | ICD-10-CM | POA: Diagnosis not present

## 2020-04-02 DIAGNOSIS — I10 Essential (primary) hypertension: Secondary | ICD-10-CM | POA: Diagnosis not present

## 2020-04-02 DIAGNOSIS — I2510 Atherosclerotic heart disease of native coronary artery without angina pectoris: Secondary | ICD-10-CM | POA: Diagnosis not present

## 2020-04-02 DIAGNOSIS — E782 Mixed hyperlipidemia: Secondary | ICD-10-CM | POA: Diagnosis not present

## 2020-04-03 DIAGNOSIS — E113393 Type 2 diabetes mellitus with moderate nonproliferative diabetic retinopathy without macular edema, bilateral: Secondary | ICD-10-CM | POA: Diagnosis not present

## 2020-04-03 DIAGNOSIS — E1142 Type 2 diabetes mellitus with diabetic polyneuropathy: Secondary | ICD-10-CM | POA: Diagnosis not present

## 2020-04-03 DIAGNOSIS — Z794 Long term (current) use of insulin: Secondary | ICD-10-CM | POA: Diagnosis not present

## 2020-04-03 DIAGNOSIS — R202 Paresthesia of skin: Secondary | ICD-10-CM | POA: Diagnosis not present

## 2020-04-03 DIAGNOSIS — R200 Anesthesia of skin: Secondary | ICD-10-CM | POA: Diagnosis not present

## 2020-04-03 DIAGNOSIS — N183 Chronic kidney disease, stage 3 unspecified: Secondary | ICD-10-CM | POA: Diagnosis not present

## 2020-04-03 DIAGNOSIS — I2510 Atherosclerotic heart disease of native coronary artery without angina pectoris: Secondary | ICD-10-CM | POA: Diagnosis not present

## 2020-04-03 DIAGNOSIS — E1159 Type 2 diabetes mellitus with other circulatory complications: Secondary | ICD-10-CM | POA: Diagnosis not present

## 2020-04-03 DIAGNOSIS — E1122 Type 2 diabetes mellitus with diabetic chronic kidney disease: Secondary | ICD-10-CM | POA: Diagnosis not present

## 2020-04-06 ENCOUNTER — Encounter (INDEPENDENT_AMBULATORY_CARE_PROVIDER_SITE_OTHER): Payer: Self-pay | Admitting: Nurse Practitioner

## 2020-04-06 NOTE — Progress Notes (Signed)
Subjective:    Patient ID: Patrick Turner, male    DOB: November 14, 1945, 74 y.o.   MRN: [id number] Chief Complaint  Patient presents with  . Follow-up    ultrasound follow up    The patient returns today for noninvasive studies related to pain and numbness in his lower extremities.  The patient notes that he fell on some ice that his right foot was swollen and numb following.  He does note that this numbness extends from his calf to his inner thigh.  The patient also notes that recently he has foot had swelling and it was very painful at the time.  The patient notes that the swelling has resolved at this point in time and the pain is gone with it.  He denies any classic claudication-like symptoms.  He denies any rest pain like symptoms.  He denies any fever, chills, nausea, vomiting or diarrhea.  Today noninvasive studies show an ABI of 1.45 on the right and 1.16 on the left.  The right has a TBI 0.1 with a TBI 0.69 on the left.  The patient has triphasic tibial artery waveforms bilaterally with strong toe waveforms bilaterally.   Review of Systems  Cardiovascular: Positive for leg swelling.  Musculoskeletal: Positive for gait problem.  Neurological: Positive for numbness.  All other systems reviewed and are negative.      Objective:   Physical Exam Vitals reviewed.  HENT:     Head: Normocephalic.  Cardiovascular:     Rate and Rhythm: Normal rate and regular rhythm.     Pulses: Normal pulses.  Pulmonary:     Effort: Pulmonary effort is normal.  Neurological:     Mental Status: He is alert and oriented to person, place, and time.  Psychiatric:        Mood and Affect: Mood normal.        Behavior: Behavior normal.        Thought Content: Thought content normal.        Judgment: Judgment normal.     BP 131/68 (BP Location: Right Arm)   Pulse 63   Resp 16   Wt 180 lb (81.6 kg)   BMI 29.95 kg/m   Past Medical History:  Diagnosis Date  . Altered mental status   . Anemia     . Anxiety   . Aortic atherosclerosis ([place])   . Arthritis   . Coronary artery disease   . CRD (chronic renal disease)   . Depression   . Diabetes mellitus without complication ([place])   . Encephalopathy acute   . GERD (gastroesophageal reflux disease)   . Headache   . Hypercholesteremia   . Hypertension   . Neuropathy   . Severe obesity ([place])   . Sleep apnea   . Sleep terror    per patient this year per patient     Social History   Socioeconomic History  . Marital status: Married    Spouse name: [name]  . Number of children: 2  . Years of education: Not on file  . Highest education level: Associate degree: academic program  Occupational History  . Occupation: works at [employer]  Tobacco Use  . Smoking status: Never Smoker  . Smokeless tobacco: Never Used  Vaping Use  . Vaping Use: Never used  Substance and Sexual Activity  . Alcohol use: Yes    Comment: rare  . Drug use: No  . Sexual activity: Not on file  Other Topics Concern  .  Not on file  Social History Narrative   Lives at home in private residence with wife   Social Determinants of Health   Financial Resource Strain: [place]   . Difficulty of Paying Living Expenses: Not hard at all  Food Insecurity: No Food Insecurity  . Worried About Charity fundraiser in the Last Year: Never true  . Ran Out of Food in the Last Year: Never true  Transportation Needs: No Transportation Needs  . Lack of Transportation (Medical): No  . Lack of Transportation (Non-Medical): No  Physical Activity: Unknown  . Days of Exercise per Week: 0 days  . Minutes of Exercise per Session: Not on file  Stress: No Stress Concern Present  . Feeling of Stress : Only a little  Social Connections: Unknown  . Frequency of Communication with Friends and Family: More than three times a week  . Frequency of Social Gatherings with Friends and Family: Not on file  . Attends Religious Services: 1 to 4 times per year  . Active Member of  Clubs or Organizations: Not on file  . Attends Archivist Meetings: Not on file  . Marital Status: Married  Human resources officer Violence: Not At Risk  . Fear of Current or Ex-Partner: No  . Emotionally Abused: No  . Physically Abused: No  . Sexually Abused: No    Past Surgical History:  Procedure Laterality Date  . APPENDECTOMY    . CIRCUMCISION    . COLONOSCOPY    . COLONOSCOPY WITH PROPOFOL N/A 07/10/2018   Procedure: COLONOSCOPY WITH PROPOFOL;  Surgeon: Lollie Sails, MD;  Location: [facility] ENDOSCOPY;  Service: Endoscopy;  Laterality: N/A;  . COLONOSCOPY WITH PROPOFOL N/A 04/23/2019   Procedure: COLONOSCOPY WITH PROPOFOL;  Surgeon: Lollie Sails, MD;  Location: [facility] ENDOSCOPY;  Service: Endoscopy;  Laterality: N/A;  . CORONARY STENT INTERVENTION N/A 10/03/2019   Procedure: CORONARY STENT INTERVENTION;  Surgeon: Yolonda Kida, MD;  Location: [place] CV LAB;  Service: Cardiovascular;  Laterality: N/A;  . ESOPHAGOGASTRODUODENOSCOPY    . ESOPHAGOGASTRODUODENOSCOPY (EGD) WITH PROPOFOL N/A 04/23/2019   Procedure: ESOPHAGOGASTRODUODENOSCOPY (EGD) WITH PROPOFOL;  Surgeon: Lollie Sails, MD;  Location: [facility] ENDOSCOPY;  Service: Endoscopy;  Laterality: N/A;  . JOINT REPLACEMENT    . 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 KNEE ARTHROSCOPY Right   . LEFT HEART CATH AND CORONARY ANGIOGRAPHY N/A 10/03/2019   Procedure: Left Heart Cath and possible Coronary intervention;  Surgeon: Dionisio David, MD;  Location: [place] CV LAB;  Service: Cardiovascular;  Laterality: N/A;  . TONSILLECTOMY    . VISCERAL ANGIOGRAPHY N/A 05/23/2019   Procedure: VISCERAL ANGIOGRAPHY;  Surgeon: Algernon Huxley, MD;  Location: [place] CV LAB;  Service: Cardiovascular;  Laterality: N/A;  . VISCERAL ANGIOGRAPHY N/A 11/04/2019   Procedure:  VISCERAL ANGIOGRAPHY;  Surgeon: Algernon Huxley, MD;  Location: [place] CV LAB;  Service: Cardiovascular;  Laterality: N/A;    Family History  Problem Relation Age of Onset  . Diabetes Mother   . Heart disease Mother   . Cancer Father   . COPD Father     Allergies  Allergen Reactions  . Penicillins Anaphylaxis    Has patient had a PCN reaction causing immediate rash, facial/tongue/throat swelling, SOB or lightheadedness with hypotension: Yes Has patient had a PCN reaction causing severe rash involving mucus  membranes or skin necrosis: No Has patient had a PCN reaction that required hospitalization Yes Has patient had a PCN reaction occurring within the last 10 years: No If all of the above answers are "NO", then may proceed with Cephalosporin use.  . Tavist [Clemastine] Swelling       Assessment & Plan:   1. Chronic mesenteric ischemia (HCC) Recommend:  The patient has evidence of chronic asymptomatic mesenteric atherosclerosis.  The patient denies lifestyle limiting changes at this point in time.  Given the lack of symptoms no intervention is warranted at this time.   No further invasive studies, angiography or surgery at this time  The patient should continue walking and begin a more formal exercise program.   The patient should continue antiplatelet therapy and aggressive treatment of the lipid abnormalities  Patient should undergo noninvasive studies as ordered. The patient will follow up with me after the studies.   Patient will return in 6 months with noninvasive studies 2. 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  Also the patient does have some known peripheral neuropathy which is likely the cause of the numbness and tingling.  Patient will continue to follow-up with PCP with lower extremity symptoms.  3. HTN (hypertension),  benign Continue antihypertensive medications as already ordered, these medications have been reviewed and there are no changes at this time.    Current Outpatient Medications on File Prior to Visit  Medication Sig Dispense Refill  . ACCU-CHEK AVIVA PLUS test strip Use to test blood sugar twice daily    . Accu-Chek Softclix Lancets lancets Use to test blood sugar twice daily    . acetaminophen (TYLENOL) 325 MG tablet Take 2 tablets (650 mg total) by mouth every 6 (six) hours as needed for mild pain (or Fever >/= 101). 50 tablet 0  . ascorbic acid (VITAMIN C) 500 MG tablet Take by mouth.    Marland Kitchen aspirin 81 MG tablet Take 1 tablet (81 mg total) by mouth daily.    Marland Kitchen atorvastatin (LIPITOR) 80 MG tablet Take 80 mg by mouth at bedtime.    . clindamycin (CLEOCIN) 300 MG capsule Take by mouth.    . clonazePAM (KLONOPIN) 1 MG tablet Take 0.5 mg by mouth at bedtime as needed for anxiety.   2  . gabapentin (NEURONTIN) 400 MG capsule Take 800 mg by mouth at bedtime.     . hydrALAZINE (APRESOLINE) 25 MG tablet Take 25 mg by mouth 2 (two) times daily.    . insulin detemir (LEVEMIR) 100 UNIT/ML injection Inject 15 Units into the skin daily.    . iron polysaccharides (NIFEREX) 150 MG capsule Take 150 mg by mouth daily.    Marland Kitchen lisinopril (ZESTRIL) 10 MG tablet Take 10 mg by mouth daily.     . metFORMIN (GLUCOPHAGE-XR) 500 MG 24 hr tablet Take 500 mg by mouth 2 (two) times daily with a meal. 2 tablets twice daily with meals    . metoprolol tartrate (LOPRESSOR) 25 MG tablet Take 0.5 tablets (12.5 mg total) by mouth 2 (two) times daily. (Patient taking differently: Take 25 mg by mouth at bedtime. ) 60 tablet 0  . Omega-3 Fatty Acids (FISH OIL) 1000 MG CAPS Take 1,000 mg by mouth 2 (two) times daily.     Marland Kitchen OZEMPIC, 0.25 OR 0.5 MG/DOSE, 2 MG/1.5ML SOPN Inject 1 Dose into the skin once a week. Wednesdays    .  pantoprazole (PROTONIX) 40 MG tablet Take 40 mg by mouth daily.    Marland Kitchen PARoxetine (PAXIL) 40 MG tablet Take 40 mg  by mouth daily.     . pioglitazone (ACTOS) 15 MG tablet Take 15 mg by mouth daily.     . QUEtiapine (SEROQUEL) 100 MG tablet Take 100 mg by mouth at bedtime.    . ranolazine (RANEXA) 500 MG 12 hr tablet Take 1 tablet by mouth 2 (two) times daily.    . vitamin B-12 (CYANOCOBALAMIN) 1000 MCG tablet Take 1,000 mcg by mouth daily.    . Vitamin D, Ergocalciferol, (DRISDOL) 1.25 MG (50000 UNIT) CAPS capsule     . clopidogrel (PLAVIX) 75 MG tablet Take 1 tablet (75 mg total) by mouth daily. (Patient not taking: Reported on 02/18/2020) 30 tablet 11   No current facility-administered medications on file prior to visit.    There are no Patient Instructions on file for this visit. No follow-ups on file.   Kris Hartmann, NP

## 2020-04-08 DIAGNOSIS — E113312 Type 2 diabetes mellitus with moderate nonproliferative diabetic retinopathy with macular edema, left eye: Secondary | ICD-10-CM | POA: Diagnosis not present

## 2020-04-20 DIAGNOSIS — I10 Essential (primary) hypertension: Secondary | ICD-10-CM | POA: Diagnosis not present

## 2020-04-20 DIAGNOSIS — E113393 Type 2 diabetes mellitus with moderate nonproliferative diabetic retinopathy without macular edema, bilateral: Secondary | ICD-10-CM | POA: Diagnosis not present

## 2020-04-20 DIAGNOSIS — E1159 Type 2 diabetes mellitus with other circulatory complications: Secondary | ICD-10-CM | POA: Diagnosis not present

## 2020-04-20 DIAGNOSIS — I25118 Atherosclerotic heart disease of native coronary artery with other forms of angina pectoris: Secondary | ICD-10-CM | POA: Diagnosis not present

## 2020-04-20 DIAGNOSIS — Z794 Long term (current) use of insulin: Secondary | ICD-10-CM | POA: Diagnosis not present

## 2020-04-22 ENCOUNTER — Other Ambulatory Visit: Payer: Self-pay | Admitting: *Deleted

## 2020-04-22 DIAGNOSIS — F323 Major depressive disorder, single episode, severe with psychotic features: Secondary | ICD-10-CM | POA: Diagnosis not present

## 2020-04-22 NOTE — Patient Outreach (Signed)
[facility]) Care Management  [place]  04/22/2020   TANYA MARVIN 19-Jul-1946 [id number]    Telephone Assessment  Disease Management Program  Recent Hospital Admissions 1/19-1/23/21 for Chest Pain, PCI stents to Mid and distal LAD, anemia.  1/31-10/14/19 for Acute Renal Failure, pseudoaneurysm right femoral artery.   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 Chronic mesenteric ischemia.FrequentFalls. peripheral neuropathy,retinopathy with intravitreal injections, Right5th metatarsal fracture, Iron deficiency anemia, 11/04/19 Aortogram and selective angiogram of celiac and superior mesentric artery. Angioplasty of celiac artery ED Visit : 03/21/20 at [facility] DX: Near Syncope, Chest pain, Hyperkalemia.  Subjective:  Unsuccessful outreach call to patient preferred contact number no answer able to leave a HIPAA compliant message for return call    Plan Will plan return call in the next month.   Joylene Draft, RN, BSN  [place] Management Coordinator  [phone]- Mobile [phone]- Toll Free Main Office

## 2020-04-27 DIAGNOSIS — E1122 Type 2 diabetes mellitus with diabetic chronic kidney disease: Secondary | ICD-10-CM | POA: Diagnosis not present

## 2020-04-27 DIAGNOSIS — R296 Repeated falls: Secondary | ICD-10-CM | POA: Insufficient documentation

## 2020-04-27 DIAGNOSIS — N183 Chronic kidney disease, stage 3 unspecified: Secondary | ICD-10-CM | POA: Diagnosis not present

## 2020-04-27 DIAGNOSIS — Z794 Long term (current) use of insulin: Secondary | ICD-10-CM | POA: Diagnosis not present

## 2020-04-27 DIAGNOSIS — F325 Major depressive disorder, single episode, in full remission: Secondary | ICD-10-CM | POA: Diagnosis not present

## 2020-04-27 DIAGNOSIS — E1142 Type 2 diabetes mellitus with diabetic polyneuropathy: Secondary | ICD-10-CM | POA: Diagnosis not present

## 2020-04-27 DIAGNOSIS — Z9989 Dependence on other enabling machines and devices: Secondary | ICD-10-CM | POA: Insufficient documentation

## 2020-04-27 DIAGNOSIS — E1159 Type 2 diabetes mellitus with other circulatory complications: Secondary | ICD-10-CM | POA: Diagnosis not present

## 2020-04-27 DIAGNOSIS — I25118 Atherosclerotic heart disease of native coronary artery with other forms of angina pectoris: Secondary | ICD-10-CM | POA: Diagnosis not present

## 2020-04-27 DIAGNOSIS — I129 Hypertensive chronic kidney disease with stage 1 through stage 4 chronic kidney disease, or unspecified chronic kidney disease: Secondary | ICD-10-CM | POA: Diagnosis not present

## 2020-05-04 DIAGNOSIS — R0602 Shortness of breath: Secondary | ICD-10-CM | POA: Diagnosis not present

## 2020-05-04 DIAGNOSIS — I10 Essential (primary) hypertension: Secondary | ICD-10-CM | POA: Diagnosis not present

## 2020-05-04 DIAGNOSIS — I2510 Atherosclerotic heart disease of native coronary artery without angina pectoris: Secondary | ICD-10-CM | POA: Diagnosis not present

## 2020-05-04 DIAGNOSIS — E782 Mixed hyperlipidemia: Secondary | ICD-10-CM | POA: Diagnosis not present

## 2020-05-14 ENCOUNTER — Other Ambulatory Visit: Payer: Self-pay | Admitting: *Deleted

## 2020-05-14 NOTE — Patient Outreach (Signed)
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 Chronic mesenteric ischemia.FrequentFalls. peripheral neuropathy,retinopathy with intravitreal injections, Right5th metatarsal fracture, Iron deficiency anemia, 11/04/19 Aortogram and selective angiogram of celiac and superior mesentric artery. Angioplasty of celiac artery ED Visit : 03/21/20 at [facility] DX: Near Syncope, Chest pain, Hyperkalemia.   Subjective:  Unsuccessful outreach call to patient home number, no answer received message, call can't be completed as dialed, reviewed for correct number dialed, attempt call x 2.  Placed call to patient mobile preferred number no answer able to leave a HIPAA compliant voice mail message for return call.   Noted [facility] CMA Anastasiya Hopkins  has recently been able to contact patient 05/06/20  at home number, she verified patient mobile number is preferred  contact number.  Returned call to patient mobile number no answer able to leave a HIPAA complaint message for return call.    Plan If no return call on today, schedule next visit in a month.  Will send unsuccessful outreach letter.     Joylene Draft, RN, BSN  [place] Management Coordinator  [phone]- Mobile [phone]- Toll Free Main Office

## 2020-05-15 DIAGNOSIS — K639 Disease of intestine, unspecified: Secondary | ICD-10-CM | POA: Diagnosis not present

## 2020-05-15 DIAGNOSIS — I10 Essential (primary) hypertension: Secondary | ICD-10-CM | POA: Diagnosis not present

## 2020-05-19 ENCOUNTER — Encounter: Payer: Self-pay | Admitting: Physical Therapy

## 2020-05-19 ENCOUNTER — Ambulatory Visit: Payer: Medicare HMO | Attending: Neurology | Admitting: Physical Therapy

## 2020-05-19 ENCOUNTER — Other Ambulatory Visit: Payer: Self-pay

## 2020-05-19 DIAGNOSIS — Z9181 History of falling: Secondary | ICD-10-CM

## 2020-05-19 DIAGNOSIS — R2681 Unsteadiness on feet: Secondary | ICD-10-CM | POA: Diagnosis not present

## 2020-05-19 NOTE — Therapy (Signed)
[place] MAIN [facility] SERVICES [address] [place], [place], [address] Phone: [phone]   Fax:  [phone]  Physical Therapy Evaluation  Patient Details  Name: Patrick Turner MRN: [id number] Date of Birth: [birth year] Referring Provider (PT): Dr. Manuella Ghazi   Encounter Date: 05/19/2020   PT End of Session - 05/19/20 1153    Visit Number 1    Number of Visits 17    Date for PT Re-Evaluation 07/14/20    Authorization Type [employer], $40 copay    PT Start Time 1102    PT Stop Time 1205    PT Time Calculation (min) 63 min    Equipment Utilized During Treatment Gait belt    Activity Tolerance Patient tolerated treatment well    Behavior During Therapy [facility] for tasks assessed/performed           Past Medical History:  Diagnosis Date  . Altered mental status   . Anemia   . Anxiety   . Aortic atherosclerosis ([place])   . Arthritis   . Coronary artery disease   . CRD (chronic renal disease)   . Depression   . Diabetes mellitus without complication ([place])   . Encephalopathy acute   . GERD (gastroesophageal reflux disease)   . Headache   . Hypercholesteremia   . Hypertension   . Neuropathy   . Severe obesity ([place])   . Sleep apnea   . Sleep terror    per patient this year per patient     Past Surgical History:  Procedure Laterality Date  . APPENDECTOMY    . CIRCUMCISION    . COLONOSCOPY    . COLONOSCOPY WITH PROPOFOL N/A 07/10/2018   Procedure: COLONOSCOPY WITH PROPOFOL;  Surgeon: Lollie Sails, MD;  Location: [facility] ENDOSCOPY;  Service: Endoscopy;  Laterality: N/A;  . COLONOSCOPY WITH PROPOFOL N/A 04/23/2019   Procedure: COLONOSCOPY WITH PROPOFOL;  Surgeon: Lollie Sails, MD;  Location: [facility] ENDOSCOPY;  Service: Endoscopy;  Laterality: N/A;  . CORONARY STENT INTERVENTION N/A 10/03/2019   Procedure: CORONARY STENT INTERVENTION;  Surgeon: Yolonda Kida, MD;  Location: [facility] CV LAB;  Service: Cardiovascular;   Laterality: N/A;  . ESOPHAGOGASTRODUODENOSCOPY    . ESOPHAGOGASTRODUODENOSCOPY (EGD) WITH PROPOFOL N/A 04/23/2019   Procedure: ESOPHAGOGASTRODUODENOSCOPY (EGD) WITH PROPOFOL;  Surgeon: Lollie Sails, MD;  Location: [facility] ENDOSCOPY;  Service: Endoscopy;  Laterality: N/A;  . JOINT REPLACEMENT    . 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 KNEE ARTHROSCOPY Right   . LEFT HEART CATH AND CORONARY ANGIOGRAPHY N/A 10/03/2019   Procedure: Left Heart Cath and possible Coronary intervention;  Surgeon: Dionisio David, MD;  Location: [place] CV LAB;  Service: Cardiovascular;  Laterality: N/A;  . TONSILLECTOMY    . VISCERAL ANGIOGRAPHY N/A 05/23/2019   Procedure: VISCERAL ANGIOGRAPHY;  Surgeon: Algernon Huxley, MD;  Location: [place] CV LAB;  Service: Cardiovascular;  Laterality: N/A;  . VISCERAL ANGIOGRAPHY N/A 11/04/2019   Procedure: VISCERAL ANGIOGRAPHY;  Surgeon: Algernon Huxley, MD;  Location: [place] CV LAB;  Service: Cardiovascular;  Laterality: N/A;    There were no vitals filed for this visit.    Subjective Assessment - 05/19/20 1103    Subjective "I fall a lot and am unsteady."    Pertinent History 74 yo Male reports history of imbalance. He is s/p RLE TKA  approximately 3 years ago. He also reports history of neuropathy in BLE (R>L) which does affect his balance. 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 He reports not using cane at work so that he has use of UE for work tasks. 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 He has multiple bruises/abrasions on UE due to  falls/stumbles. He is still working at [employer] and reports standing on them 6-7 hours a day. he reports increased pain when he sits down; Patient did receive outpatient PT earlier in the year for RLE weakness with no improvement; Therapy consisted of strengthening, ultrasound, with no improvement; He was given a HEP and reports he would do them sometimes; He is not doing them now.    How long can you sit comfortably? >1 hour    How long can you stand comfortably? will stand 6-7 hours a work; starts to have fatigue/discomforts after 2-3 hours;    How long can you walk comfortably? about 500 feet but staggers often/unstable;    Diagnostic tests EMG study in July 2021 shows RLE partial femoral neuropathy and length dependent peripheral neuropathy;    Patient Stated Goals improve walking, be able to walk without stumbling/staggering to the side;    Currently in Pain? No/denies              [facility] PT Assessment - 05/19/20 0001      Assessment   Medical Diagnosis Imbalance     Referring Provider (PT) Dr. Manuella Ghazi    Onset Date/Surgical Date 10/14/19    Hand Dominance Right    Next MD Visit none scheduled    Prior Therapy had PT in early 2021 for RLE strengthening with minimal results;       Precautions   Precautions Fall    Required Braces or Orthoses --   none     Restrictions   Weight Bearing Restrictions No      Balance Screen   Has the patient fallen in the past 6 months Yes    How many times? 4    Has the patient had a decrease in activity level because of a fear of falling?  Yes    Is the patient reluctant to leave their home because of a fear of falling?  No      Home Ecologist residence    Living Arrangements Spouse/significant other    Available Help at Discharge Family    Type of [place] to enter    Entrance Stairs-Number of Steps 2    Entrance Stairs-Rails None    Home Layout Two level    [place] -  single point;Walker - 2 wheels;Shower seat      Prior Function   Level of Independence Requires assistive device for independence   uses cane; mod I for self care ADLs;    Vocation Full time employment    Vocation Requirements 5-6 days a week; lots of standing;       Cognition   Overall Cognitive Status Within Functional Limits for tasks assessed      Observation/Other Assessments   Focus on Therapeutic Outcomes (FOTO)  51%      Sensation  Light Touch Impaired by gross assessment    Additional Comments Numbness/tingling in BLE feet (entire foot) additional numbness on RLE lower leg along medial lower leg, along L4 dermatome       Coordination   Gross Motor Movements are Fluid and Coordinated Yes    Fine Motor Movements are Fluid and Coordinated Yes    Heel Shin Test accurate bilaterally but slow movement;       Posture/Postural Control   Posture Comments WFL      ROM / Strength   AROM / PROM / Strength AROM;Strength      AROM   Overall AROM Comments WFL for UE/LE      Strength   Overall Strength Comments RLE hip grossly 4-/5, knee 4/5, ankle 4/5; LLE hip 5/5, knee 5/5, ankle 4/5      Transfers   Comments required 2 HHA to push up from chair;       Ambulation/Gait   Gait Comments ambulates with SPC with slower gait speed, slight veering side/side; exhibits slight antalgic gait pattern with short stance time on RLE and decreased step length with LLE. Requires CGA to close supervision for safety;      Standardized Balance Assessment   Standardized Balance Assessment 10 meter walk test;Five Times Sit to Stand    Five times sit to stand comments  36 sec with both arms pushing on chair (>15 sec indicates high fall risk)    10 Meter Walk 0.45 m/s with SPC indicating high fall risk, limited home ambulator      High Level Balance   High Level Balance Comments static standing balance is fair, dynamic standing balance is fair;                       Objective  measurements completed on examination: See above findings.      PT Short Term Goals - 05/20/20 1015      PT SHORT TERM GOAL #1   Title Patient will be adherent to HEP at least 3x a week to improve functional strength and balance for better safety at home.    Time 4    Period Weeks    Status New    Target Date 06/17/20      PT SHORT TERM GOAL #2   Title Patient will deny any falls over last 2 weeks to exhibit improvement in safety awareness and improved balance at home and at work.    Time 4    Period Weeks    Status New    Target Date 06/17/20           PT Long Term Goals - 05/20/20 1016      PT LONG TERM GOAL #1   Title Patient will increase Berg Balance score by > 6 points to demonstrate decreased fall risk during functional activities.    Time 8    Period Weeks    Status New    Target Date 07/15/20      PT LONG TERM GOAL #2   Title Patient will increase 10 meter walk test to >0.8 m/s as to improve gait speed for better community ambulation and to reduce fall risk.    Time 8    Period Weeks    Status New    Target Date 07/15/20      PT LONG TERM GOAL #3   Title Patient will improve FOTO score to >60% to indicate improved functional mobility at home and at work.  Time 8    Period Weeks    Status New    Target Date 07/15/20      PT LONG TERM GOAL #4   Title Patient will reduce 5 times sit<>Stand to <20 sec to indicate improved transfer ability and improved LE functional strength.    Time 8    Period Weeks    Status New    Target Date 07/15/20                      PT Education - 05/19/20 1153    Education Details recommendations/plan of care    Person(s) Educated Patient    Methods Explanation    Comprehension Verbalized understanding                       Plan - 05/19/20 1156    Clinical Impression Statement 74 yo Male reports increased imbalance/unsteadiness since February 2021. Patient has a PMH significant for past RLE TKA  approximately 3 years ago and BLE peripheral neuropathy. He also recently had EMG study which shows RLE partial femoral nerve impairement. Patient reports numbness/tingling in BLE feet/RLE lower leg significantly affects his balance. In addition his right knee will buckle from time to time contributing to a fall. Patient does exhibits weakness on RLE. He had prior therapy earlier in 2021 for RLE weakness with no improvement. Patient does test as a high fall risk. He is a limited home ambulator as evidenced by 10 meter walk test. Patient would benefit from additional balance assessments at next session. He would benefit from skilled PT intervention to improve balance and gait safety;    Personal Factors and Comorbidities Age;Comorbidity 3+;Past/Current Experience;Time since onset of injury/illness/exacerbation    Comorbidities Depression, HTN, DMx2, hypercholesterolemia, GERD, neuropathy    Examination-Activity Limitations Squat;Stairs;Locomotion Level;Stand;Transfers    Examination-Participation Restrictions Cleaning;Community Activity;Meal Prep;Occupation;Shop;Volunteer;Yard Work    Merchant [employer] officer Evolving/Moderate complexity    Clinical Decision Making Moderate    Rehab Potential Fair    PT Frequency 2x / week    PT Duration 8 weeks    PT Treatment/Interventions Cryotherapy;Electrical Stimulation;Moist Heat;Gait training;Stair training;Functional mobility training;Therapeutic activities;Therapeutic exercise;Balance training;Neuromuscular re-education;Patient/family education;Orthotic Fit/Training;Energy conservation    PT Next Visit Plan complete Berg/initiate HEP    PT Home Exercise Plan will address next session;    Consulted and Agree with Plan of Care Patient           Patient will benefit from skilled therapeutic intervention in order to improve the following deficits and impairments:     Visit Diagnosis: Unsteadiness on feet  History of falling     Problem  List Patient Active Problem List   Diagnosis Date Noted  . Numbness and tingling of both legs 02/18/2020  . Primary osteoarthritis of left knee 01/04/2020  . Diabetic retinopathy ([place]) 01/01/2020  . ASCVD (arteriosclerotic cardiovascular disease) 10/16/2019  . Hospitalization within last 30 days 10/16/2019  . Hyperkalemia 10/14/2019  . Pseudoaneurysm of right femoral artery ([place]) 10/14/2019  . AKI (acute kidney injury) ([place]) 10/13/2019  . Unstable angina ([place]) 10/02/2019  . Chest pain 10/01/2019  . Uncontrolled type 2 diabetes mellitus with hyperglycemia, with long-term current use of insulin ([place]) 09/27/2019  . Chronic mesenteric ischemia ([place]) 05/06/2019  . Abdominal pain 05/03/2019  . Acute on chronic renal failure ([place]) 03/23/2019  . AMS (altered mental status) 12/24/2018  . Dizziness 06/30/2018  . HLD (hyperlipidemia) 02/23/2018  . Aortic atherosclerosis ([place]) 11/02/2017  . Healthcare maintenance  12/26/2016  . DM type 2 with diabetic peripheral neuropathy ([place]) 11/09/2016  . Presence of right artificial knee joint 08/14/2016  . Bilateral carotid artery disease ([place]) 07/12/2016  . 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 Acute diarrhea 05/30/2016  . Encephalopathy acute 05/30/2016  . Anemia 05/30/2016  . Acute on chronic renal insufficiency 05/30/2016  . Diarrhea 05/30/2016  . Depression, major, in remission ([place]) 08/02/2014  . Severe obesity (BMI 35.0-39.9) with comorbidity ([place]) 07/12/2014  . GERD (gastroesophageal reflux disease) 03/08/2014  . Hyperlipidemia associated with type 2 diabetes mellitus ([place]) 03/08/2014  . Drug-induced Parkinsonism ([place]) 01/24/2014  . Diabetes mellitus with stage 3 chronic kidney disease ([place]) 01/21/2014  . HTN (hypertension), benign 01/21/2014  . OSA (obstructive sleep apnea) 01/21/2014  . Unspecified transient cerebral ischemia 05/29/2012    Wayland Baik PT, DPT 05/19/2020, 1:57 PM  [place]  MAIN [facility] SERVICES [address] [place], [place], [address] Phone: [phone]   Fax:  [phone]  Name: Patrick Turner MRN: [id number] Date of Birth: April 26, 1946

## 2020-05-20 ENCOUNTER — Ambulatory Visit: Payer: Medicare HMO | Admitting: *Deleted

## 2020-05-20 ENCOUNTER — Ambulatory Visit: Payer: Self-pay | Admitting: *Deleted

## 2020-05-21 ENCOUNTER — Other Ambulatory Visit: Payer: Self-pay

## 2020-05-21 ENCOUNTER — Ambulatory Visit: Payer: Medicare HMO | Admitting: Physical Therapy

## 2020-05-21 ENCOUNTER — Encounter: Payer: Self-pay | Admitting: Physical Therapy

## 2020-05-21 DIAGNOSIS — Z9181 History of falling: Secondary | ICD-10-CM

## 2020-05-21 DIAGNOSIS — R2681 Unsteadiness on feet: Secondary | ICD-10-CM | POA: Diagnosis not present

## 2020-05-21 NOTE — Therapy (Signed)
[place] MAIN [facility] SERVICES [address] [place], [place], [address] Phone: [phone]   Fax:  [phone]  Physical Therapy Treatment  Patient Details  Name: Patrick Turner MRN: [id number] Date of Birth: [birth year] Referring Provider (PT): Dr. Manuella Ghazi   Encounter Date: 05/21/2020   PT End of Session - 05/21/20 1009    Visit Number 2    Number of Visits 17    Date for PT Re-Evaluation 07/14/20    Authorization Type [employer], $40 copay    Authorization Time Period Authorized 16 visits from 9/9-11/4    Authorization - Visit Number 1    Authorization - Number of Visits 16    PT Start Time 1005    PT Stop Time 1050    PT Time Calculation (min) 45 min    Equipment Utilized During Treatment Gait belt    Activity Tolerance Patient tolerated treatment well    Behavior During Therapy WFL for tasks assessed/performed           Past Medical History:  Diagnosis Date  . Altered mental status   . Anemia   . Anxiety   . Aortic atherosclerosis ([place])   . Arthritis   . Coronary artery disease   . CRD (chronic renal disease)   . Depression   . Diabetes mellitus without complication ([place])   . Encephalopathy acute   . GERD (gastroesophageal reflux disease)   . Headache   . Hypercholesteremia   . Hypertension   . Neuropathy   . Severe obesity ([place])   . Sleep apnea   . Sleep terror    per patient this year per patient     Past Surgical History:  Procedure Laterality Date  . APPENDECTOMY    . CIRCUMCISION    . COLONOSCOPY    . COLONOSCOPY WITH PROPOFOL N/A 07/10/2018   Procedure: COLONOSCOPY WITH PROPOFOL;  Surgeon: Lollie Sails, MD;  Location: [facility] ENDOSCOPY;  Service: Endoscopy;  Laterality: N/A;  . COLONOSCOPY WITH PROPOFOL N/A 04/23/2019   Procedure: COLONOSCOPY WITH PROPOFOL;  Surgeon: Lollie Sails, MD;  Location: [facility] ENDOSCOPY;  Service: Endoscopy;  Laterality: N/A;  . CORONARY STENT INTERVENTION N/A 10/03/2019    Procedure: CORONARY STENT INTERVENTION;  Surgeon: Yolonda Kida, MD;  Location: [place] CV LAB;  Service: Cardiovascular;  Laterality: N/A;  . ESOPHAGOGASTRODUODENOSCOPY    . ESOPHAGOGASTRODUODENOSCOPY (EGD) WITH PROPOFOL N/A 04/23/2019   Procedure: ESOPHAGOGASTRODUODENOSCOPY (EGD) WITH PROPOFOL;  Surgeon: Lollie Sails, MD;  Location: [facility] ENDOSCOPY;  Service: Endoscopy;  Laterality: N/A;  . JOINT REPLACEMENT    . 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 KNEE ARTHROSCOPY Right   . LEFT HEART CATH AND CORONARY ANGIOGRAPHY N/A 10/03/2019   Procedure: Left Heart Cath and possible Coronary intervention;  Surgeon: Dionisio David, MD;  Location: [place] CV LAB;  Service: Cardiovascular;  Laterality: N/A;  . TONSILLECTOMY    . VISCERAL ANGIOGRAPHY N/A 05/23/2019   Procedure: VISCERAL ANGIOGRAPHY;  Surgeon: Algernon Huxley, MD;  Location: [place] CV LAB;  Service: Cardiovascular;  Laterality: N/A;  . VISCERAL ANGIOGRAPHY N/A 11/04/2019   Procedure: VISCERAL ANGIOGRAPHY;  Surgeon: Algernon Huxley, MD;  Location: [place] CV LAB;  Service: Cardiovascular;  Laterality: N/A;    There were no vitals filed for this visit.   Subjective Assessment - 05/21/20 1007  Subjective Patient reports increased soreness in bottom/posterior thigh related to previous fall; Denies any recent falls. Reports no new changes;    Pertinent History 74 yo Male reports history of imbalance. He is s/p RLE TKA approximately 3 years ago. He also reports history of neuropathy in BLE (R>L) which does affect his balance. 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 He reports not using cane at work so that he has use of UE for work tasks. He will use SPC when at  home especially with stair negotiation; He reports multiple falls (4 falls in last 2 months), most recently one yesterday when reaching up and fell forward into some shelves at the store. He has multiple bruises/abrasions on UE due to falls/stumbles. He is still working at [employer] and reports standing on them 6-7 hours a day. he reports increased pain when he sits down; Patient did receive outpatient PT earlier in the year for RLE weakness with no improvement; Therapy consisted of strengthening, ultrasound, with no improvement; He was given a HEP and reports he would do them sometimes; He is not doing them now.    How long can you sit comfortably? >1 hour    How long can you stand comfortably? will stand 6-7 hours a work; starts to have fatigue/discomforts after 2-3 hours;    How long can you walk comfortably? about 500 feet but staggers often/unstable;    Diagnostic tests EMG study in July 2021 shows RLE partial femoral neuropathy and length dependent peripheral neuropathy;    Patient Stated Goals improve walking, be able to walk without stumbling/staggering to the side;    Currently in Pain? Yes    Pain Score 4     Pain Location Buttocks    Pain Orientation Lower    Pain Descriptors / Indicators Aching;Sore    Pain Type Acute pain    Pain Onset 1 to 4 weeks ago    Pain Frequency Intermittent    Aggravating Factors  worse with sitting/getting out of bed    Pain Relieving Factors standing/walking    Effect of Pain on Daily Activities decreased transfer tolerance;    Multiple Pain Sites No              [facility] PT Assessment - 05/21/20 0001      Standardized Balance Assessment   Standardized Balance Assessment Berg Balance Test      Berg Balance Test   Sit to Stand Able to stand  independently using hands    Standing Unsupported Able to stand 2 minutes with supervision    Sitting with Back Unsupported but Feet Supported on Floor or Stool Able to sit safely and securely 2 minutes     Stand to Sit Controls descent by using hands    Transfers Able to transfer safely, definite need of hands    Standing Unsupported with Eyes Closed Able to stand 10 seconds with supervision    Standing Unsupported with Feet Together Able to place feet together independently and stand for 1 minute with supervision    From Standing, Reach Forward with Outstretched Arm Can reach forward >12 cm safely (5")    From Standing Position, Pick up Object from Floor Able to pick up shoe, needs supervision    From Standing Position, Turn to Look Behind Over each Shoulder Needs assist to keep from losing balance and falling    Turn 360 Degrees Needs close  supervision or verbal cueing    Standing Unsupported, Alternately Place Feet on Step/Stool Able to complete >2 steps/needs minimal assist    Standing Unsupported, One Foot in [place] help to step but can hold 15 seconds    Standing on One Leg Tries to lift leg/unable to hold 3 seconds but remains standing independently    Total Score 32    Berg comment: 100% risk for falls              TREATMENT: Warm up on Nustep BUE/BLE level 2 x4 min (Unbilled);  Instructed patient in [place] Assessment, see above;  Instructed patient in HEP: -Heel raises 3 sec hold x10 reps with rail assist for safety, min VCs to reduce weight bearing in UE to facilitate increased LE weight bearing; -side stepping unsupported x8 feet x2 laps each direction; -tandem stance with 1-0 rail assist, 10 sec hold x3 reps each foot in front; -feet together unsupported head turns side/side x10 reps;  Patient required supervision for safety; He would reach out for rail assist for balance recovery intermittently as needed; Provided written HEP for adherence, see patient instructions;  Patient also instructed in other balance exercise:  Standing on airex: -alternate toe taps from airex to 6 inch step x10 reps each with 1 rail assist; -standing one foot on airex, one foot on  6 inch step:  Unsupported standing 15 sec hold  Unsupported standing with ball pass side/side x10 reps;  Patient required min A for safety when standing on airex; he had significant difficulty when standing on RLE due to weakness/numbness with immediate loss of balance and increased shakiness through ankle/LE;  Patient tolerated session well; Reports mild fatigue at end of session;                      PT Education - 05/21/20 1008    Education Details Balance/HEP    Person(s) Educated Patient    Methods Explanation    Comprehension Verbalized understanding            PT Short Term Goals - 05/20/20 1015      PT SHORT TERM GOAL #1   Title Patient will be adherent to HEP at least 3x a week to improve functional strength and balance for better safety at home.    Time 4    Period Weeks    Status New    Target Date 06/17/20      PT SHORT TERM GOAL #2   Title Patient will deny any falls over last 2 weeks to exhibit improvement in safety awareness and improved balance at home and at work.    Time 4    Period Weeks    Status New    Target Date 06/17/20             PT Long Term Goals - 05/20/20 1016      PT LONG TERM GOAL #1   Title Patient will increase Berg Balance score by > 6 points to demonstrate decreased fall risk during functional activities.    Time 8    Period Weeks    Status New    Target Date 07/15/20      PT LONG TERM GOAL #2   Title Patient will increase 10 meter walk test to >0.8 m/s as to improve gait speed for better community ambulation and to reduce fall risk.    Time 8    Period Weeks    Status New  Target Date 07/15/20      PT LONG TERM GOAL #3   Title Patient will improve FOTO score to >60% to indicate improved functional mobility at home and at work.    Time 8    Period Weeks    Status New    Target Date 07/15/20      PT LONG TERM GOAL #4   Title Patient will reduce 5 times sit<>Stand to <20 sec to indicate improved  transfer ability and improved LE functional strength.    Time 8    Period Weeks    Status New    Target Date 07/15/20                 Plan - 05/21/20 1057    Clinical Impression Statement Patient motivated and participated well within session. He was instructed in [employer] and does test as a high risk for falls. he was instructed in balance exercise for HEP. He does require min VCs for proper exercise technique/weight shift for better balance control. Patient does require intermittent HHA for stance control, especially with narrow base of support. Instructed patient in advanced balance exercise, utilizing airex pad to challenge stance control. He did require min A for safety especially with RLE weight bearing. He would benefit from additional skilled PT Intervention to improve strength, balance and mobility;    Personal Factors and Comorbidities Age;Comorbidity 3+;Past/Current Experience;Time since onset of injury/illness/exacerbation    Comorbidities Depression, HTN, DMx2, hypercholesterolemia, GERD, neuropathy    Examination-Activity Limitations Squat;Stairs;Locomotion Level;Stand;Transfers    Examination-Participation Restrictions Cleaning;Community Activity;Meal Prep;Occupation;Shop;Volunteer;Yard Work    [employer] Evolving/Moderate complexity    Rehab Potential Fair    PT Frequency 2x / week    PT Duration 8 weeks    PT Treatment/Interventions Cryotherapy;Electrical Stimulation;Moist Heat;Gait training;Stair training;Functional mobility training;Therapeutic activities;Therapeutic exercise;Balance training;Neuromuscular re-education;Patient/family education;Orthotic Fit/Training;Energy conservation    PT Next Visit Plan complete Berg/initiate HEP    PT Home Exercise Plan will address next session;    Consulted and Agree with Plan of Care Patient           Patient will benefit from skilled therapeutic intervention in order to improve the  following deficits and impairments:  Abnormal gait, Decreased balance, Decreased endurance, Decreased mobility, Difficulty walking, Decreased activity tolerance, Decreased strength  Visit Diagnosis: Unsteadiness on feet  History of falling     Problem List Patient Active Problem List   Diagnosis Date Noted  . Numbness and tingling of both legs 02/18/2020  . Primary osteoarthritis of left knee 01/04/2020  . Diabetic retinopathy ([place]) 01/01/2020  . ASCVD (arteriosclerotic cardiovascular disease) 10/16/2019  . Hospitalization within last 30 days 10/16/2019  . Hyperkalemia 10/14/2019  . Pseudoaneurysm of right femoral artery ([place]) 10/14/2019  . AKI (acute kidney injury) ([place]) 10/13/2019  . Unstable angina ([place]) 10/02/2019  . Chest pain 10/01/2019  . Uncontrolled type 2 diabetes mellitus with hyperglycemia, with long-term current use of insulin ([place]) 09/27/2019  . Chronic mesenteric ischemia ([place]) 05/06/2019  . Abdominal pain 05/03/2019  . Acute on chronic renal failure ([place]) 03/23/2019  . AMS (altered mental status) 12/24/2018  . Dizziness 06/30/2018  . HLD (hyperlipidemia) 02/23/2018  . Aortic atherosclerosis ([place]) 11/02/2017  . Healthcare maintenance 12/26/2016  . DM type 2 with diabetic peripheral neuropathy ([place]) 11/09/2016  . Presence of right artificial knee joint 08/14/2016  . Bilateral carotid artery disease ([place]) 07/12/2016  . 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 Acute diarrhea 05/30/2016  . Encephalopathy acute 05/30/2016  .  Anemia 05/30/2016  . Acute on chronic renal insufficiency 05/30/2016  . Diarrhea 05/30/2016  . Depression, major, in remission ([place]) 08/02/2014  . Severe obesity (BMI 35.0-39.9) with comorbidity ([place]) 07/12/2014  . GERD (gastroesophageal reflux disease) 03/08/2014  . Hyperlipidemia associated with type 2 diabetes mellitus ([place]) 03/08/2014  . Drug-induced Parkinsonism ([place]) 01/24/2014  . Diabetes mellitus with stage 3 chronic kidney disease  ([place]) 01/21/2014  . HTN (hypertension), benign 01/21/2014  . OSA (obstructive sleep apnea) 01/21/2014  . Unspecified transient cerebral ischemia 05/29/2012    Jazleen Robeck PT, DPT 05/21/2020, 10:59 AM  [place] MAIN [facility] SERVICES [address] [place], [place], [address] Phone: [phone]   Fax:  [phone]  Name: Patrick Turner MRN: [id number] Date of Birth: [birth year]

## 2020-05-21 NOTE — Patient Instructions (Signed)
Access Code: [email or web address] URL: [email or web address] Date: 05/21/2020 Prepared by: Blanche East  Exercises Heel rises with counter support - 1 x daily - 7 x weekly - 1 sets - 10 reps - 3 sec hold Side Stepping with Counter Support - 1 x daily - 7 x weekly - 1 sets - 3-4 reps Standing Tandem Balance with Counter Support - 1 x daily - 7 x weekly - 1 sets - 3 reps - 10 sec hold Feet Together Balance in Corner - 1 x daily - 7 x weekly - 1 sets - 10 reps

## 2020-05-25 ENCOUNTER — Encounter: Payer: Self-pay | Admitting: Physical Therapy

## 2020-05-25 ENCOUNTER — Ambulatory Visit: Payer: Medicare HMO | Admitting: Physical Therapy

## 2020-05-25 ENCOUNTER — Other Ambulatory Visit: Payer: Self-pay

## 2020-05-25 DIAGNOSIS — R2681 Unsteadiness on feet: Secondary | ICD-10-CM | POA: Diagnosis not present

## 2020-05-25 DIAGNOSIS — Z9181 History of falling: Secondary | ICD-10-CM

## 2020-05-25 NOTE — Therapy (Signed)
[place] MA[facility] SERVICES [address] [place], [place], [address] Phone: [phone]   Fax:  [phone]  Physical Therapy Treatment  Patient Details  Name: Patrick [place] MRN: [id number] Date of Birth: 11-Sep-1946 Referring Provider (PT): Dr. Manuella Ghazi   Encounter Date: 05/25/2020   PT End of Session - 05/25/20 1024    Visit Number 3    Number of Visits 17    Date for PT Re-Evaluation 07/14/20    Authorization Type humana MCRE, $40 copay    Authorization Time Period Authorized 16 visits from 9/9-11/4    Authorization - Visit Number 2    Authorization - Number of Visits 16    PT Start Time 1017    PT Stop Time 1100    PT Time Calculation (min) 43 min    Equipment Utilized During Treatment Gait belt    Activity Tolerance Patient tolerated treatment well    Behavior During Therapy WFL for tasks assessed/performed           Past Medical History:  Diagnosis Date  . Altered mental status   . Anemia   . Anxiety   . Aortic atherosclerosis ([place])   . Arthritis   . Coronary artery disease   . CRD (chronic renal disease)   . Depression   . Diabetes mellitus without complication ([place])   . Encephalopathy acute   . GERD (gastroesophageal reflux disease)   . Headache   . Hypercholesteremia   . Hypertension   . Neuropathy   . Severe obesity ([place])   . Sleep apnea   . Sleep terror    per patient this year per patient     Past Surgical History:  Procedure Laterality Date  . APPENDECTOMY    . CIRCUMCISION    . COLONOSCOPY    . COLONOSCOPY WITH PROPOFOL N/A 07/10/2018   Procedure: COLONOSCOPY WITH PROPOFOL;  Surgeon: Lollie Sails, MD;  Location: [facility] ENDOSCOPY;  Service: Endoscopy;  Laterality: N/A;  . COLONOSCOPY WITH PROPOFOL N/A 04/23/2019   Procedure: COLONOSCOPY WITH PROPOFOL;  Surgeon: Lollie Sails, MD;  [facility]: [facility] ENDOSCOPY;  Service: Endoscopy;  Laterality: N/A;  . CORONARY STENT INTERVENTION N/A 10/03/2019    Procedure: CORONARY STENT INTERVENTION;  Surgeon: Yolonda Kida, MD;  Location: [place] CV LAB;  Service: Cardiovascular;  Laterality: N/A;  . ESOPHAGOGASTRODUODENOSCOPY    . ESOPHAGOGASTRODUODENOSCOPY (EGD) WITH PROPOFOL N/A 04/23/2019   Procedure: ESOPHAGOGASTRODUODENOSCOPY (EGD) WITH PROPOFOL;  Surgeon: Lollie Sails, MD;  Location: [facility] ENDOSCOPY;  Service: Endoscopy;  Laterality: N/A;  . JOINT REPLACEMENT    . 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 KNEE ARTHROSCOPY Right   . LEFT HEART CATH AND CORONARY ANGIOGRAPHY N/A 10/03/2019   Procedure: Left Heart Cath and possible Coronary intervention;  Surgeon: Dionisio David, MD;  Location: [place] CV LAB;  Service: Cardiovascular;  Laterality: N/A;  . TONSILLECTOMY    . VISCERAL ANGIOGRAPHY N/A 05/23/2019   Procedure: VISCERAL ANGIOGRAPHY;  Surgeon: Algernon Huxley, MD;  Location: [place] CV LAB;  Service: Cardiovascular;  Laterality: N/A;  . VISCERAL ANGIOGRAPHY N/A 11/04/2019   Procedure: VISCERAL ANGIOGRAPHY;  Surgeon: Algernon Huxley, MD;  Location: [place] CV LAB;  Service: Cardiovascular;  Laterality: N/A;    There were no vitals filed for this visit.   Subjective Assessment - 05/25/20 1022  Subjective Patient reports continued soreness in bottom but states that its getting a little better. Reports doing HEP without incident over the weekend;    Pertinent History 74 yo Male reports history of imbalance. He is s/p RLE TKA approximately 3 years ago. He also reports history of neuropathy in BLE (R>L) which does affect his balance. 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 He reports not using cane at work so that he has use of UE for work tasks. 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 He has multiple bruises/abrasions on UE due to falls/stumbles. He is still working at [employer] and reports standing on them 6-7 hours a day. he reports increased pain when he sits down; Patient did receive outpatient PT earlier in the year for RLE weakness with no improvement; Therapy consisted of strengthening, ultrasound, with no improvement; He was given a HEP and reports he would do them sometimes; He is not doing them now.    How long can you sit comfortably? >1 hour    How long can you stand comfortably? will stand 6-7 hours a work; starts to have fatigue/discomforts after 2-3 hours;    How long can you walk comfortably? about 500 feet but staggers often/unstable;    Diagnostic tests EMG study in July 2021 shows RLE partial femoral neuropathy and length dependent peripheral neuropathy;    Patient Stated Goals improve walking, be able to walk without stumbling/staggering to the side;    Currently in Pain? Yes    Pain Score 3     Pain Location Buttocks    Pain Orientation Lower    Pain Descriptors / Indicators Aching;Sore    Pain Type Acute pain    Pain Onset 1 to 4 weeks ago    Pain Frequency Intermittent    Aggravating Factors  worse with sitting/getting out of bed    Pain Relieving Factors standing/walking    Effect of Pain on Daily Activities decreased transfer tolerance;    Multiple Pain Sites No                  TREATMENT: Seated with 3# ankle weight: -LAQ x15 reps bilaterally with cues to increase terminal knee extension for better motor control;   Standing with 3# ankle weight: -heel raises 3 sec hold x15 reps; -hip abduction x15 reps; -hip flexion march x15 reps; Patient required min-moderate verbal/tactile cues for correct exercise technique.   Instructed patient in  balance exercise:   Standing on airex: -alternate toe taps from airex to 6 inch step x15 reps each with 1-0 rail assist; -standing one foot on airex, one foot on 6 inch step:             Unsupported standing 15 sec hold             Unsupported standing with ball pass side/side x5 reps each direction;  Standing on airex: -tandem stance with 1-0 rail assist 10 sec hold x2 reps each foot in front; -feet together:   Unsupported standing:   Head turns side/side x10 reps;   Head turns up/down x10 reps;  Stepping over orange hurdle: -forward/backward x10 reps with 1 rail assist; -Side stepping x10 reps each direction with 1 rail assist;  Patient required min A for safety when standing on airex; he had significant difficulty when standing on RLE due to weakness/numbness with immediate loss of balance and increased shakiness through ankle/LE; He also had difficulty with reduced rail assist with standing balance activities;   Patient tolerated session well; Reports mild fatigue at end of session; Denies any increase in pain;                      PT Education - 05/25/20 1023    Education Details balance/strengthening, HEP    Person(s) Educated Patient    Methods Explanation;Verbal cues    Comprehension Verbalized understanding;Returned demonstration;Verbal cues required;Need further instruction            PT Short Term Goals - 05/20/20 1015      PT SHORT TERM GOAL #1   Title Patient will be adherent to HEP at least 3x a week to improve functional strength and balance for better safety at home.    Time 4    Period Weeks    Status New    Target Date 06/17/20      PT SHORT TERM GOAL #2   Title Patient will deny any falls over last 2 weeks to exhibit improvement in safety awareness and improved balance at home and at work.    Time 4    Period Weeks    Status New    Target Date 06/17/20             PT Long Term Goals - 05/20/20 1016      PT LONG TERM  GOAL #1   Title Patient will increase Berg Balance score by > 6 points to demonstrate decreased fall risk during functional activities.    Time 8    Period Weeks    Status New    Target Date 07/15/20      PT LONG TERM GOAL #2   Title Patient will increase 10 meter walk test to >0.8 m/s as to improve gait speed for better community ambulation and to reduce fall risk.    Time 8    Period Weeks    Status New    Target Date 07/15/20      PT LONG TERM GOAL #3   Title Patient will improve FOTO score to >60% to indicate improved functional mobility at home and at work.    Time 8    Period Weeks    Status New    Target Date 07/15/20      PT LONG TERM GOAL #4   Title Patient will reduce 5 times sit<>Stand to <20 sec to indicate improved transfer ability and improved LE functional strength.    Time 8    Period Weeks    Status New    Target Date 07/15/20                Plan - 05/26/20 0914    Clinical Impression Statement Patient motivated and participated well within session. he was instructed in advanced LE strengthening exercise with increased resistance/repetition. He does fatigue with prolonged standing requiring occasional seated rest break. Patient instructed in advanced balance exercise. He does have difficulty with narrow base of support, reduced rail assist, especially on compliant surfaces. patient did require min A for most balance exercise with cues for erect posture, weight shift and trunk control. He would benefit from additional skilled PT Intervention to improve strength, balance and mobility;    Personal Factors and Comorbidities Age;Comorbidity 3+;Past/Current  Experience;Time since onset of injury/illness/exacerbation    Comorbidities Depression, HTN, DMx2, hypercholesterolemia, GERD, neuropathy    Examination-Activity Limitations Squat;Stairs;Locomotion Level;Stand;Transfers    Examination-Participation Restrictions Cleaning;Community Activity;Meal  Prep;Occupation;Shop;Volunteer;Yard Work    [employer] Evolving/Moderate complexity    Rehab Potential Fair    PT Frequency 2x / week    PT Duration 8 weeks    PT Treatment/Interventions Cryotherapy;Electrical Stimulation;Moist Heat;Gait training;Stair training;Functional mobility training;Therapeutic activities;Therapeutic exercise;Balance training;Neuromuscular re-education;Patient/family education;Orthotic Fit/Training;Energy conservation    PT Next Visit Plan complete Berg/initiate HEP    PT Home Exercise Plan will address next session;    Consulted and Agree with Plan of Care Patient               Patient will benefit from skilled therapeutic intervention in order to improve the following deficits and impairments:     Visit Diagnosis: Unsteadiness on feet  History of falling     Problem List Patient Active Problem List   Diagnosis Date Noted  . Numbness and tingling of both legs 02/18/2020  . Primary osteoarthritis of left knee 01/04/2020  . Diabetic retinopathy ([place]) 01/01/2020  . ASCVD (arteriosclerotic cardiovascular disease) 10/16/2019  . Hospitalization within last 30 days 10/16/2019  . Hyperkalemia 10/14/2019  . Pseudoaneurysm of right femoral artery ([place]) 10/14/2019  . AKI (acute kidney injury) ([place]) 10/13/2019  . Unstable angina ([place]) 10/02/2019  . Chest pain 10/01/2019  . Uncontrolled type 2 diabetes mellitus with hyperglycemia, with long-term current use of insulin ([place]) 09/27/2019  . Chronic mesenteric ischemia ([place]) 05/06/2019  . Abdominal pain 05/03/2019  . Acute on chronic renal failure ([place]) 03/23/2019  . AMS (altered mental status) 12/24/2018  . Dizziness 06/30/2018  . HLD (hyperlipidemia) 02/23/2018  . Aortic atherosclerosis ([place]) 11/02/2017  . Healthcare maintenance 12/26/2016  . DM type 2 with diabetic peripheral neuropathy ([place]) 11/09/2016  . Presence of right artificial knee joint 08/14/2016  . Bilateral carotid  artery disease ([place]) 07/12/2016  . 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 Acute diarrhea 05/30/2016  . Encephalopathy acute 05/30/2016  . Anemia 05/30/2016  . Acute on chronic renal insufficiency 05/30/2016  . Diarrhea 05/30/2016  . Depression, major, in remission ([place]) 08/02/2014  . Severe obesity (BMI 35.0-39.9) with comorbidity ([place]) 07/12/2014  . GERD (gastroesophageal reflux disease) 03/08/2014  . Hyperlipidemia associated with type 2 diabetes mellitus ([place]) 03/08/2014  . Drug-induced Parkinsonism ([place]) 01/24/2014  . Diabetes mellitus with stage 3 chronic kidney disease ([place]) 01/21/2014  . HTN (hypertension), benign 01/21/2014  . OSA (obstructive sleep apnea) 01/21/2014  . Unspecified transient cerebral ischemia 05/29/2012    Maisley Hainsworth PT, DPT 05/25/2020, 10:24 AM  [place] MAIN [facility] SERVICES [address] [place], [place], [address] Phone: [phone]   Fax:  [phone]  Name: AMAREON PHUNG MRN: [id number] Date of Birth: 11-06-1945

## 2020-05-27 ENCOUNTER — Emergency Department
Admission: EM | Admit: 2020-05-27 | Discharge: 2020-05-27 | Discharge status: Against Medical Advice | Payer: Medicare HMO

## 2020-05-27 ENCOUNTER — Other Ambulatory Visit: Payer: Self-pay

## 2020-05-27 ENCOUNTER — Ambulatory Visit: Payer: Medicare HMO | Admitting: Physical Therapy

## 2020-05-27 VITALS — BP 197/57 | HR 65

## 2020-05-27 DIAGNOSIS — Z9181 History of falling: Secondary | ICD-10-CM

## 2020-05-27 DIAGNOSIS — R2681 Unsteadiness on feet: Secondary | ICD-10-CM

## 2020-05-27 NOTE — ED Notes (Signed)
Pt comes into the [employer] with no response

## 2020-05-27 NOTE — Therapy (Signed)
[place] MAIN [facility] SERVICES [address] [place], [place], [address] Phone: [phone]   Fax:  [phone]  Physical Therapy Treatment/Cancellation  Patient Details  Name: Patrick [place] MRN: [id number] Date of Birth: [birth year] Referring Provider (PT): Dr. Manuella Ghazi   Encounter Date: 05/27/2020   PT End of Session - 05/27/20 1044    Visit Number 3    Number of Visits 17    Date for PT Re-Evaluation 07/14/20    Authorization Type humana MCRE, $40 copay    Authorization Time Period Authorized 16 visits from 9/9-11/4    Authorization - Visit Number 2    Authorization - Number of Visits [place] During Treatment Gait belt    Activity Tolerance Patient tolerated treatment well    Behavior During Therapy [facility] for tasks assessed/performed           Past Medical History:  Diagnosis Date   Altered mental status    Anemia    Anxiety    Aortic atherosclerosis ([place])    Arthritis    Coronary artery disease    CRD (chronic renal disease)    Depression    Diabetes mellitus without complication ([place])    Encephalopathy acute    GERD (gastroesophageal reflux disease)    Headache    Hypercholesteremia    Hypertension    Neuropathy    Severe obesity ([place])    Sleep apnea    Sleep terror    per patient this year per patient     Past Surgical History:  Procedure Laterality Date   APPENDECTOMY     CIRCUMCISION     COLONOSCOPY     COLONOSCOPY WITH PROPOFOL N/A 07/10/2018   Procedure: COLONOSCOPY WITH PROPOFOL;  Surgeon: Lollie Sails, MD;  Location: [facility] ENDOSCOPY;  Service: Endoscopy;  Laterality: N/A;   COLONOSCOPY WITH PROPOFOL N/A 04/23/2019   Procedure: COLONOSCOPY WITH PROPOFOL;  Surgeon: Lollie Sails, MD;  Location: [facility] ENDOSCOPY;  Service: Endoscopy;  Laterality: N/A;   CORONARY STENT INTERVENTION N/A 10/03/2019   Procedure: CORONARY STENT INTERVENTION;  Surgeon: Yolonda Kida,  MD;  Location: [place] CV LAB;  Service: Cardiovascular;  Laterality: N/A;   ESOPHAGOGASTRODUODENOSCOPY     ESOPHAGOGASTRODUODENOSCOPY (EGD) WITH PROPOFOL N/A 04/23/2019   Procedure: ESOPHAGOGASTRODUODENOSCOPY (EGD) WITH PROPOFOL;  Surgeon: Lollie Sails, MD;  Location: [facility] ENDOSCOPY;  Service: Endoscopy;  Laterality: N/A;   JOINT REPLACEMENT     KNEE ARTHROPLASTY Right 06/27/2016   Procedure: COMPUTER ASSISTED TOTAL KNEE ARTHROPLASTY;  Surgeon: Dereck Leep, MD;  Location: [facility] ORS;  Service: Orthopedics;  Laterality: Right;   KNEE ARTHROSCOPY Right    LEFT HEART CATH AND CORONARY ANGIOGRAPHY N/A 10/03/2019   Procedure: Left Heart Cath and possible Coronary intervention;  Surgeon: Dionisio David, MD;  Location: [place] CV LAB;  Service: Cardiovascular;  Laterality: N/A;   TONSILLECTOMY     VISCERAL ANGIOGRAPHY N/A 05/23/2019   Procedure: VISCERAL ANGIOGRAPHY;  Surgeon: Algernon Huxley, MD;  Location: [place] CV LAB;  Service: Cardiovascular;  Laterality: N/A;   VISCERAL ANGIOGRAPHY N/A 11/04/2019   Procedure: VISCERAL ANGIOGRAPHY;  Surgeon: Algernon Huxley, MD;  Location: [place] CV LAB;  Service: Cardiovascular;  Laterality: N/A;    Vitals:   05/27/20 1039  BP: (!) 197/57  Pulse: 65  SpO2: 100%     Subjective Assessment - 05/27/20 1039    Subjective Patient reports not feeling well today. He reports increased buttocks pain  as well as left groin/flank pain; He reports increased fatigue and mild shortness of breath. He states he will feel like this every 3 weeks or so but usually after he sleeps it will be better. Unfortunately today he was not feeling any better this morning;    Pertinent History 74 yo Male reports history of imbalance. He is s/p RLE TKA approximately 3 years ago. He also reports history of neuropathy in BLE (R>L) which does affect his balance. His recent EMG study shows Right partial femoral neuropathy and generalized length dependent  peripheral neuropathy;  He presents to therapy with SPC. He reports not using cane at work so that he has use of UE for work tasks. 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 He has multiple bruises/abrasions on UE due to falls/stumbles. He is still working at [employer] and reports standing on them 6-7 hours a day. he reports increased pain when he sits down; Patient did receive outpatient PT earlier in the year for RLE weakness with no improvement; Therapy consisted of strengthening, ultrasound, with no improvement; He was given a HEP and reports he would do them sometimes; He is not doing them now.    How long can you sit comfortably? >1 hour    How long can you stand comfortably? will stand 6-7 hours a work; starts to have fatigue/discomforts after 2-3 hours;    How long can you walk comfortably? about 500 feet but staggers often/unstable;    Diagnostic tests EMG study in July 2021 shows RLE partial femoral neuropathy and length dependent peripheral neuropathy;    Patient Stated Goals improve walking, be able to walk without stumbling/staggering to the side;    Currently in Pain? Yes    Pain Location Buttocks    Pain Orientation Lower    Pain Descriptors / Indicators Aching;Sore    Pain Type Acute pain    Pain Onset 1 to 4 weeks ago    Pain Frequency Intermittent    Aggravating Factors  worse with sitting/transfers/getting out of bed    Pain Relieving Factors standing/walking    Effect of Pain on Daily Activities decreased transfer tolerance;    Multiple Pain Sites No             Patient presents to therapy and states, "I feel bad today." He reports increased fatigue and increased buttocks/left flank pain. He has been having buttocks pain since a fall. However the left flank pain/groin pain is new. Patient states he will  feel "off" like this every few weeks and then will feel better after he sleeps. However today he didn't feel better this morning when he woke up.  Vitals assessed, see above; BP elevated at 197/57. Repeated after 5 min rest break with no significant change with systolic still elevated at 197.  PT contacted patient's primary care office. Nurse recommended patient go to urgent care/ED as they don't have any available appointments for today.  Patient was transported to ED for assessment.  Therapy visit cancelled this session;                           PT Short Term Goals - 05/20/20 1015      PT SHORT TERM GOAL #1   Title Patient will be adherent to HEP at  least 3x a week to improve functional strength and balance for better safety at home.    Time 4    Period Weeks    Status New    Target Date 06/17/20      PT SHORT TERM GOAL #2   Title Patient will deny any falls over last 2 weeks to exhibit improvement in safety awareness and improved balance at home and at work.    Time 4    Period Weeks    Status New    Target Date 06/17/20             PT Long Term Goals - 05/20/20 1016      PT LONG TERM GOAL #1   Title Patient will increase Berg Balance score by > 6 points to demonstrate decreased fall risk during functional activities.    Time 8    Period Weeks    Status New    Target Date 07/15/20      PT LONG TERM GOAL #2   Title Patient will increase 10 meter walk test to >0.8 m/s as to improve gait speed for better community ambulation and to reduce fall risk.    Time 8    Period Weeks    Status New    Target Date 07/15/20      PT LONG TERM GOAL #3   Title Patient will improve FOTO score to >60% to indicate improved functional mobility at home and at work.    Time 8    Period Weeks    Status New    Target Date 07/15/20      PT LONG TERM GOAL #4   Title Patient will reduce 5 times sit<>Stand to <20 sec to indicate improved transfer ability and  improved LE functional strength.    Time 8    Period Weeks    Status New    Target Date 07/15/20                  Patient will benefit from skilled therapeutic intervention in order to improve the following deficits and impairments:     Visit Diagnosis: Unsteadiness on feet  History of falling     Problem List Patient Active Problem List   Diagnosis Date Noted   Numbness and tingling of both legs 02/18/2020   Primary osteoarthritis of left knee 01/04/2020   Diabetic retinopathy ([place]) 01/01/2020   ASCVD (arteriosclerotic cardiovascular disease) 10/16/2019   Hospitalization within last 30 days 10/16/2019   Hyperkalemia 10/14/2019   Pseudoaneurysm of right femoral artery ([place]) 10/14/2019   AKI (acute kidney injury) ([place]) 10/13/2019   Unstable angina ([place]) 10/02/2019   Chest pain 10/01/2019   Uncontrolled type 2 diabetes mellitus with hyperglycemia, with long-term current use of insulin ([place]) 09/27/2019   Chronic mesenteric ischemia ([place]) 05/06/2019   Abdominal pain 05/03/2019   Acute on chronic renal failure ([place]) 03/23/2019   AMS (altered mental status) 12/24/2018   Dizziness 06/30/2018   HLD (hyperlipidemia) 02/23/2018   Aortic atherosclerosis ([place]) 11/02/2017   Healthcare maintenance 12/26/2016   DM type 2 with diabetic peripheral neuropathy ([place]) 11/09/2016   Presence of right artificial knee joint 08/14/2016   Bilateral carotid artery disease ([place]) 07/12/2016   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   Acute diarrhea 05/30/2016   Encephalopathy acute 05/30/2016   Anemia 05/30/2016   Acute on chronic renal insufficiency 05/30/2016   Diarrhea 05/30/2016   Depression, major, in remission ([place]) 08/02/2014   Severe obesity (BMI 35.0-39.9) with comorbidity (  [place]) 07/12/2014   GERD (gastroesophageal reflux disease) 03/08/2014   Hyperlipidemia associated with type 2 diabetes mellitus ([place]) 03/08/2014   Drug-induced Parkinsonism  ([place]) 01/24/2014   Diabetes mellitus with stage 3 chronic kidney disease ([place]) 01/21/2014   HTN (hypertension), benign 01/21/2014   OSA (obstructive sleep apnea) 01/21/2014   Unspecified transient cerebral ischemia 05/29/2012    Lora Chavers PT, DPT 05/27/2020, 10:45 AM  [place] MAIN [facility] SERVICES [place], [place], [address] Phone: [phone]   Fax:  [phone]  Name: Patrick [place] MRN: [id number] Date of Birth: 1945-10-08

## 2020-05-27 NOTE — ED Notes (Signed)
Pt called in the [facility] with no response 

## 2020-05-28 DIAGNOSIS — D631 Anemia in chronic kidney disease: Secondary | ICD-10-CM | POA: Diagnosis not present

## 2020-05-28 DIAGNOSIS — E1122 Type 2 diabetes mellitus with diabetic chronic kidney disease: Secondary | ICD-10-CM | POA: Diagnosis not present

## 2020-05-28 DIAGNOSIS — R809 Proteinuria, unspecified: Secondary | ICD-10-CM | POA: Insufficient documentation

## 2020-05-28 DIAGNOSIS — I129 Hypertensive chronic kidney disease with stage 1 through stage 4 chronic kidney disease, or unspecified chronic kidney disease: Secondary | ICD-10-CM | POA: Insufficient documentation

## 2020-05-28 DIAGNOSIS — E875 Hyperkalemia: Secondary | ICD-10-CM | POA: Diagnosis not present

## 2020-05-28 DIAGNOSIS — N189 Chronic kidney disease, unspecified: Secondary | ICD-10-CM | POA: Insufficient documentation

## 2020-05-28 DIAGNOSIS — N1832 Chronic kidney disease, stage 3b: Secondary | ICD-10-CM | POA: Diagnosis not present

## 2020-05-29 ENCOUNTER — Other Ambulatory Visit: Payer: Self-pay | Admitting: Nephrology

## 2020-05-29 DIAGNOSIS — N182 Chronic kidney disease, stage 2 (mild): Secondary | ICD-10-CM

## 2020-05-29 DIAGNOSIS — E875 Hyperkalemia: Secondary | ICD-10-CM

## 2020-06-01 ENCOUNTER — Ambulatory Visit: Payer: Medicare HMO | Admitting: Physical Therapy

## 2020-06-03 ENCOUNTER — Ambulatory Visit: Payer: Medicare HMO | Admitting: Physical Therapy

## 2020-06-03 ENCOUNTER — Other Ambulatory Visit: Payer: Self-pay

## 2020-06-03 ENCOUNTER — Encounter: Payer: Self-pay | Admitting: Physical Therapy

## 2020-06-03 VITALS — BP 191/58

## 2020-06-03 DIAGNOSIS — R2681 Unsteadiness on feet: Secondary | ICD-10-CM | POA: Diagnosis not present

## 2020-06-03 DIAGNOSIS — Z9181 History of falling: Secondary | ICD-10-CM | POA: Diagnosis not present

## 2020-06-03 DIAGNOSIS — I10 Essential (primary) hypertension: Secondary | ICD-10-CM | POA: Diagnosis not present

## 2020-06-03 NOTE — Therapy (Signed)
[place] MAIN [facility] SERVICES [address] [place], [place], [address] Phone: [phone]   Fax:  [phone]  Physical Therapy Treatment  Patient Details  Name: Patrick Turner MRN: [id number] Date of Birth: [birth year] Referring Provider (PT): Dr. Manuella Ghazi   Encounter Date: 06/03/2020   PT End of Session - 06/03/20 1017    Visit Number 4    Number of Visits 17    Date for PT Re-Evaluation 07/14/20    Authorization Type [employer], $40 copay    Authorization Time Period Authorized 16 visits from 9/9-11/4    Authorization - Visit Number 3    Authorization - Number of Visits 16    PT Start Time 1010    PT Stop Time 1055    PT Time Calculation (min) 45 min    Equipment Utilized During Treatment Gait belt    Activity Tolerance Patient tolerated treatment well    Behavior During Therapy WFL for tasks assessed/performed           Past Medical History:  Diagnosis Date   Altered mental status    Anemia    Anxiety    Aortic atherosclerosis ([place])    Arthritis    Coronary artery disease    CRD (chronic renal disease)    Depression    Diabetes mellitus without complication ([place])    Encephalopathy acute    GERD (gastroesophageal reflux disease)    Headache    Hypercholesteremia    Hypertension    Neuropathy    Severe obesity ([place])    Sleep apnea    Sleep terror    per patient this year per patient     Past Surgical History:  Procedure Laterality Date   APPENDECTOMY     CIRCUMCISION     COLONOSCOPY     COLONOSCOPY WITH PROPOFOL N/A 07/10/2018   Procedure: COLONOSCOPY WITH PROPOFOL;  Surgeon: Lollie Sails, MD;  Location: [facility] ENDOSCOPY;  Service: Endoscopy;  Laterality: N/A;   COLONOSCOPY WITH PROPOFOL N/A 04/23/2019   Procedure: COLONOSCOPY WITH PROPOFOL;  Surgeon: Lollie Sails, MD;  Location: [facility] ENDOSCOPY;  Service: Endoscopy;  Laterality: N/A;   CORONARY STENT INTERVENTION N/A 10/03/2019    Procedure: CORONARY STENT INTERVENTION;  Surgeon: Yolonda Kida, MD;  Location: [place] CV LAB;  Service: Cardiovascular;  Laterality: N/A;   ESOPHAGOGASTRODUODENOSCOPY     ESOPHAGOGASTRODUODENOSCOPY (EGD) WITH PROPOFOL N/A 04/23/2019   Procedure: ESOPHAGOGASTRODUODENOSCOPY (EGD) WITH PROPOFOL;  Surgeon: Lollie Sails, MD;  Location: [facility] ENDOSCOPY;  Service: Endoscopy;  Laterality: N/A;   JOINT REPLACEMENT     KNEE ARTHROPLASTY Right 06/27/2016   Procedure: COMPUTER ASSISTED TOTAL KNEE ARTHROPLASTY;  Surgeon: Dereck Leep, MD;  Location: [facility] ORS;  Service: Orthopedics;  Laterality: Right;   KNEE ARTHROSCOPY Right    LEFT HEART CATH AND CORONARY ANGIOGRAPHY N/A 10/03/2019   Procedure: Left Heart Cath and possible Coronary intervention;  Surgeon: Dionisio David, MD;  Location: [place] CV LAB;  Service: Cardiovascular;  Laterality: N/A;   TONSILLECTOMY     VISCERAL ANGIOGRAPHY N/A 05/23/2019   Procedure: VISCERAL ANGIOGRAPHY;  Surgeon: Algernon Huxley, MD;  Location: [place] CV LAB;  Service: Cardiovascular;  Laterality: N/A;   VISCERAL ANGIOGRAPHY N/A 11/04/2019   Procedure: VISCERAL ANGIOGRAPHY;  Surgeon: Algernon Huxley, MD;  Location: [place] [facility] LAB;  Service: Cardiovascular;  Laterality: N/A;    Vitals:   06/03/20 1010  BP: (!) 191/58     Subjective Assessment -  06/03/20 1010    Subjective Patient went to see kidney doctor and his BP was elevated. He is supposed to have ultrasound and depending on the results he will have his BP meds adjusted; He reports he hasn't followed up with his PCP because the doctor is too busy;    Pertinent History 74 yo Male reports history of imbalance. He is s/p RLE TKA approximately 3 years ago. He also reports history of neuropathy in BLE (R>L) which does affect his balance. 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  He reports not using cane at work so that he has use of UE for work tasks. 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 He has multiple bruises/abrasions on UE due to falls/stumbles. He is still working at [employer] and reports standing on them 6-7 hours a day. he reports increased pain when he sits down; Patient did receive outpatient PT earlier in the year for RLE weakness with no improvement; Therapy consisted of strengthening, ultrasound, with no improvement; He was given a HEP and reports he would do them sometimes; He is not doing them now.    How long can you sit comfortably? >1 hour    How long can you stand comfortably? will stand 6-7 hours a work; starts to have fatigue/discomforts after 2-3 hours;    How long can you walk comfortably? about 500 feet but staggers often/unstable;    Diagnostic tests EMG study in July 2021 shows RLE partial femoral neuropathy and length dependent peripheral neuropathy;    Patient Stated Goals improve walking, be able to walk without stumbling/staggering to the side;    Currently in Pain? Yes    Pain Score 5     Pain Location Buttocks    Pain Orientation Lower    Pain Descriptors / Indicators Aching;Sore    Pain Type Acute pain    Pain Onset 1 to 4 weeks ago    Pain Frequency Intermittent    Aggravating Factors  worse with sitting/transfers/getting out of bed    Pain Relieving Factors standing/walking    Effect of Pain on Daily Activities decreased transfer tolerance;    Multiple Pain Sites No               TREATMENT: Warm up on Nustep BUE/BLE level 2 x4 min (unbilled);  Standing with 3# ankle weight: -heel raises 3 sec hold x15 reps; -hip abduction x15 reps; -hip flexion march x15 reps; Patient required min-moderate verbal/tactile cues for correct exercise technique.  He also  requires 2 HHA for balance control during LE movement;   Instructed patient in balance exercise:   Standing on 1/2 bolster (flat side up) -feet apart, heel/toe rock x15 reps -feet apart, unsupported standing alternate UE lift x5 reps each; -tandem stance unsupported standing 10 sec hold each foot in front -tandem stance unsupported standing head turns side/side x3 reps   Standing one foot on airex one foot on 4 inch step: Reaching for SAEBO one ball to each level one at a time x 4 levels each LE on airex;  Patient required min A for safety when standing on compliant surfaces especially without rail assist. He also required cues for weight shift for neutral position to avoid loss of balance/instability;  Following standing exercise, BP assessed 198/60, instructed patient in 2-3 min rest break and then re-assessed (185/59); Vitals monitored throughout session due to elevated BP;   Following  Balance exercise, BP elevated 205-210/80; had patient rest; Re-assessed after a few minutes, 199/60; Patient continues to have dizziness; Recommended patient follow up with PCP- he was taken to [facility] clinic  He is supposed to follow up with Dr. Astrid Divine tomorrow (cardiologist);                      PT Education - 06/03/20 1017    Education Details importance of following up with MD regarding vitals, balance/strengthening, HEP    Person(s) Educated Patient    Methods Explanation;Verbal cues    Comprehension Verbalized understanding;Returned demonstration;Verbal cues required;Need further instruction            PT Short Term Goals - 05/20/20 1015      PT SHORT TERM GOAL #1   Title Patient will be adherent to HEP at least 3x a week to improve functional strength and balance for better safety at home.    Time 4    Period Weeks    Status New    Target Date 06/17/20      PT SHORT TERM GOAL #2   Title Patient will deny any falls over last 2 weeks to exhibit improvement in  safety awareness and improved balance at home and at work.    Time 4    Period Weeks    Status New    Target Date 06/17/20             PT Long Term Goals - 05/20/20 1016      PT LONG TERM GOAL #1   Title Patient will increase Berg Balance score by > 6 points to demonstrate decreased fall risk during functional activities.    Time 8    Period Weeks    Status New    Target Date 07/15/20      PT LONG TERM GOAL #2   Title Patient will increase 10 meter walk test to >0.8 m/s as to improve gait speed for better community ambulation and to reduce fall risk.    Time 8    Period Weeks    Status New    Target Date 07/15/20      PT LONG TERM GOAL #3   Title Patient will improve FOTO score to >60% to indicate improved functional mobility at home and at work.    Time 8    Period Weeks    Status New    Target Date 07/15/20      PT LONG TERM GOAL #4   Title Patient will reduce 5 times sit<>Stand to <20 sec to indicate improved transfer ability and improved LE functional strength.    Time 8    Period Weeks    Status New    Target Date 07/15/20                 Plan - 06/03/20 1313    Clinical Impression Statement Patient tolerated session fair. BP was motivated throughout session. He continues to exhibit elevated systolic levels. Patient is supposed to follow up with cardiologist tomorrow. He was instructed in LE strengthening and balance exercise. He does require min A with balance exercise especially with less rail assist on compliant surfaces. He was able to exhibit better weight shift on RLE with less instability this session. patient does report increased dizziness and not feeling  well at end of session. BP monitored with systolic increasing >549. Recommended patient follow up with MD and/or go to ED. He requested to go to his PCP and will follow up with them. He was taken to his PCP office via wheelchair to [facility] clinic. He would benefit from additional skilled PT  intervention to improve strength, balance and mobility;    Personal Factors and Comorbidities Age;Comorbidity 3+;Past/Current Experience;Time since onset of injury/illness/exacerbation    Comorbidities Depression, HTN, DMx2, hypercholesterolemia, GERD, neuropathy    Examination-Activity Limitations Squat;Stairs;Locomotion Level;Stand;Transfers    Examination-Participation Restrictions Cleaning;Community Activity;Meal Prep;Occupation;Shop;Volunteer;Yard Work    [employer] Evolving/Moderate complexity    Rehab Potential Fair    PT Frequency 2x / week    PT Duration 8 weeks    PT Treatment/Interventions Cryotherapy;Electrical Stimulation;Moist Heat;Gait training;Stair training;Functional mobility training;Therapeutic activities;Therapeutic exercise;Balance training;Neuromuscular re-education;Patient/family education;Orthotic Fit/Training;Energy conservation    PT Next Visit Plan complete Berg/initiate HEP    PT Home Exercise Plan will address next session;    Consulted and Agree with Plan of Care Patient           Patient will benefit from skilled therapeutic intervention in order to improve the following deficits and impairments:  Abnormal gait, Decreased balance, Decreased endurance, Decreased mobility, Difficulty walking, Decreased activity tolerance, Decreased strength  Visit Diagnosis: Unsteadiness on feet  History of falling     Problem List Patient Active Problem List   Diagnosis Date Noted   Numbness and tingling of both legs 02/18/2020   Primary osteoarthritis of left knee 01/04/2020   Diabetic retinopathy ([place]) 01/01/2020   ASCVD (arteriosclerotic cardiovascular disease) 10/16/2019   Hospitalization within last 30 days 10/16/2019   Hyperkalemia 10/14/2019   Pseudoaneurysm of right femoral artery ([place]) 10/14/2019   AKI (acute kidney injury) ([place]) 10/13/2019   Unstable angina ([place]) 10/02/2019   Chest pain 10/01/2019   Uncontrolled type  2 diabetes mellitus with hyperglycemia, with long-term current use of insulin ([place]) 09/27/2019   Chronic mesenteric ischemia ([place]) 05/06/2019   Abdominal pain 05/03/2019   Acute on chronic renal failure ([place]) 03/23/2019   AMS (altered mental status) 12/24/2018   Dizziness 06/30/2018   HLD (hyperlipidemia) 02/23/2018   Aortic atherosclerosis ([place]) 11/02/2017   Healthcare maintenance 12/26/2016   DM type 2 with diabetic peripheral neuropathy ([place]) 11/09/2016   Presence of right artificial knee joint 08/14/2016   Bilateral carotid artery disease ([place]) 07/12/2016   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   Acute diarrhea 05/30/2016   Encephalopathy acute 05/30/2016   Anemia 05/30/2016   Acute on chronic renal insufficiency 05/30/2016   Diarrhea 05/30/2016   Depression, major, in remission ([place]) 08/02/2014   Severe obesity (BMI 35.0-39.9) with comorbidity ([place]) 07/12/2014   GERD (gastroesophageal reflux disease) 03/08/2014   Hyperlipidemia associated with type 2 diabetes mellitus ([place]) 03/08/2014   Drug-induced Parkinsonism ([place]) 01/24/2014   Diabetes mellitus with stage 3 chronic kidney disease ([place]) 01/21/2014   HTN (hypertension), benign 01/21/2014   OSA (obstructive sleep apnea) 01/21/2014   Unspecified transient cerebral ischemia 05/29/2012    Deari Sessler PT, DPT 06/03/2020, 1:16 PM  [place] [facility] SERVICES [place], [place], [address] Phone: [phone]   Fax:  [phone]  Name: Patrick Turner MRN: [id number] Date of Birth: 1945-12-05

## 2020-06-04 DIAGNOSIS — I2510 Atherosclerotic heart disease of native coronary artery without angina pectoris: Secondary | ICD-10-CM | POA: Diagnosis not present

## 2020-06-04 DIAGNOSIS — E782 Mixed hyperlipidemia: Secondary | ICD-10-CM | POA: Diagnosis not present

## 2020-06-04 DIAGNOSIS — I351 Nonrheumatic aortic (valve) insufficiency: Secondary | ICD-10-CM | POA: Diagnosis not present

## 2020-06-04 DIAGNOSIS — Z9861 Coronary angioplasty status: Secondary | ICD-10-CM | POA: Diagnosis not present

## 2020-06-04 DIAGNOSIS — I10 Essential (primary) hypertension: Secondary | ICD-10-CM | POA: Diagnosis not present

## 2020-06-08 ENCOUNTER — Ambulatory Visit
Admission: RE | Admit: 2020-06-08 | Discharge: 2020-06-08 | Disposition: A | Discharge status: Home / Self Care | Payer: Medicare HMO | Source: Ambulatory Visit | Attending: Nephrology | Admitting: Nephrology

## 2020-06-08 ENCOUNTER — Ambulatory Visit: Payer: Medicare HMO | Admitting: Physical Therapy

## 2020-06-08 ENCOUNTER — Other Ambulatory Visit: Payer: Self-pay

## 2020-06-08 DIAGNOSIS — E1122 Type 2 diabetes mellitus with diabetic chronic kidney disease: Secondary | ICD-10-CM | POA: Diagnosis not present

## 2020-06-08 DIAGNOSIS — N182 Chronic kidney disease, stage 2 (mild): Secondary | ICD-10-CM | POA: Diagnosis not present

## 2020-06-08 DIAGNOSIS — E875 Hyperkalemia: Secondary | ICD-10-CM | POA: Insufficient documentation

## 2020-06-10 ENCOUNTER — Ambulatory Visit: Payer: Medicare HMO | Admitting: Physical Therapy

## 2020-06-10 DIAGNOSIS — Z23 Encounter for immunization: Secondary | ICD-10-CM | POA: Diagnosis not present

## 2020-06-10 DIAGNOSIS — I7090 Unspecified atherosclerosis: Secondary | ICD-10-CM | POA: Diagnosis not present

## 2020-06-10 DIAGNOSIS — R102 Pelvic and perineal pain: Secondary | ICD-10-CM | POA: Diagnosis not present

## 2020-06-10 DIAGNOSIS — M160 Bilateral primary osteoarthritis of hip: Secondary | ICD-10-CM | POA: Diagnosis not present

## 2020-06-10 DIAGNOSIS — I10 Essential (primary) hypertension: Secondary | ICD-10-CM | POA: Diagnosis not present

## 2020-06-10 DIAGNOSIS — M47816 Spondylosis without myelopathy or radiculopathy, lumbar region: Secondary | ICD-10-CM | POA: Diagnosis not present

## 2020-06-11 DIAGNOSIS — I351 Nonrheumatic aortic (valve) insufficiency: Secondary | ICD-10-CM | POA: Diagnosis not present

## 2020-06-15 ENCOUNTER — Encounter: Payer: Self-pay | Admitting: Physical Therapy

## 2020-06-15 ENCOUNTER — Other Ambulatory Visit: Payer: Self-pay

## 2020-06-15 ENCOUNTER — Ambulatory Visit: Payer: Medicare HMO | Attending: Neurology | Admitting: Physical Therapy

## 2020-06-15 DIAGNOSIS — R2681 Unsteadiness on feet: Secondary | ICD-10-CM | POA: Insufficient documentation

## 2020-06-15 DIAGNOSIS — Z9181 History of falling: Secondary | ICD-10-CM | POA: Insufficient documentation

## 2020-06-15 NOTE — Therapy (Signed)
[place] MAIN [facility] SERVICES [address] [place], [place], [address] Phone: [phone]   Fax:  [phone]  Physical Therapy Treatment  Patient Details  Name: Patrick Turner MRN: [id number] Date of Birth: 1946-03-16 Referring Provider (PT): Dr. Manuella Ghazi   Encounter Date: 06/15/2020   PT End of Session - 06/15/20 1018    Visit Number 5    Number of Visits 17    Date for PT Re-Evaluation 07/14/20    Authorization Type [employer], $40 copay    Authorization Time Period Authorized 16 visits from 9/9-11/4    Authorization - Visit Number 4    Authorization - Number of Visits 16    PT Start Time 1010    PT Stop Time 1055    PT Time Calculation (min) 45 min    Equipment Utilized During Treatment Gait belt    Activity Tolerance Patient tolerated treatment well    Behavior During Therapy WFL for tasks assessed/performed           Past Medical History:  Diagnosis Date  . Altered mental status   . Anemia   . Anxiety   . Aortic atherosclerosis ([place])   . Arthritis   . Coronary artery disease   . CRD (chronic renal disease)   . Depression   . Diabetes mellitus without complication ([place])   . Encephalopathy acute   . GERD (gastroesophageal reflux disease)   . Headache   . Hypercholesteremia   . Hypertension   . Neuropathy   . Severe obesity ([place])   . Sleep apnea   . Sleep terror    per patient this year per patient     Past Surgical History:  Procedure Laterality Date  . APPENDECTOMY    . CIRCUMCISION    . COLONOSCOPY    . COLONOSCOPY WITH PROPOFOL N/A 07/10/2018   Procedure: COLONOSCOPY WITH PROPOFOL;  Surgeon: Lollie Sails, MD;  Location: [facility] ENDOSCOPY;  Service: Endoscopy;  Laterality: N/A;  . COLONOSCOPY WITH PROPOFOL N/A 04/23/2019   Procedure: COLONOSCOPY WITH PROPOFOL;  Surgeon: Lollie Sails, MD;  Location: [facility] ENDOSCOPY;  Service: Endoscopy;  Laterality: N/A;  . CORONARY STENT INTERVENTION N/A 10/03/2019    Procedure: CORONARY STENT INTERVENTION;  Surgeon: Yolonda Kida, MD;  Location: [place] CV LAB;  Service: Cardiovascular;  Laterality: N/A;  . ESOPHAGOGASTRODUODENOSCOPY    . ESOPHAGOGASTRODUODENOSCOPY (EGD) WITH PROPOFOL N/A 04/23/2019   Procedure: ESOPHAGOGASTRODUODENOSCOPY (EGD) WITH PROPOFOL;  Surgeon: Lollie Sails, MD;  Location: [facility] ENDOSCOPY;  Service: Endoscopy;  Laterality: N/A;  . JOINT REPLACEMENT    . 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 KNEE ARTHROSCOPY Right   . LEFT HEART CATH AND CORONARY ANGIOGRAPHY N/A 10/03/2019   Procedure: Left Heart Cath and possible Coronary intervention;  Surgeon: Dionisio David, MD;  Location: [place] CV LAB;  Service: Cardiovascular;  Laterality: N/A;  . TONSILLECTOMY    . VISCERAL ANGIOGRAPHY N/A 05/23/2019   Procedure: VISCERAL ANGIOGRAPHY;  Surgeon: Algernon Huxley, MD;  Location: [place] CV LAB;  Service: Cardiovascular;  Laterality: N/A;  . VISCERAL ANGIOGRAPHY N/A 11/04/2019   Procedure: VISCERAL ANGIOGRAPHY;  Surgeon: Algernon Huxley, MD;  Location: [place] CV LAB;  Service: Cardiovascular;  Laterality: N/A;    There were no vitals filed for this visit.   Subjective Assessment - 06/15/20 1014  Subjective Patient did follow up with MD who changed his medication. He is starting it today but isn't sure of name/dosage; He reports getting x-ray of buttocks but is unsure of results;    Pertinent History 74 yo Male reports history of imbalance. He is s/p RLE TKA approximately 3 years ago. He also reports history of neuropathy in BLE (R>L) which does affect his balance. 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 He reports not using cane at work so that he has use of UE  for work tasks. 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 He has multiple bruises/abrasions on UE due to falls/stumbles. He is still working at [employer] and reports standing on them 6-7 hours a day. he reports increased pain when he sits down; Patient did receive outpatient PT earlier in the year for RLE weakness with no improvement; Therapy consisted of strengthening, ultrasound, with no improvement; He was given a HEP and reports he would do them sometimes; He is not doing them now.    How long can you sit comfortably? >1 hour    How long can you stand comfortably? will stand 6-7 hours a work; starts to have fatigue/discomforts after 2-3 hours;    How long can you walk comfortably? about 500 feet but staggers often/unstable;    Diagnostic tests EMG study in July 2021 shows RLE partial femoral neuropathy and length dependent peripheral neuropathy;    Patient Stated Goals improve walking, be able to walk without stumbling/staggering to the side;    Currently in Pain? Yes    Pain Score 4     Pain Location Buttocks    Pain Orientation Lower    Pain Descriptors / Indicators Aching;Sore    Pain Type Acute pain    Pain Onset 1 to 4 weeks ago    Pain Frequency Intermittent    Aggravating Factors  worse with sitting/transfers/getting out of bed    Pain Relieving Factors standing/walking    Effect of Pain on Daily Activities decreased activity tolerance;    Multiple Pain Sites No           TREATMENT: 156/41 at start of session, had patient rest and re-assessed, 155/45  Warm up on Nustep BUE/BLE level 2 x4 min (unbilled);  Following Nustep, BP 156/44 PT called MD and left message regarding low diastolic Re-assessed: 527/78  Standing with 3# ankle weight: -heel raises 3 sec hold x15 reps; -hip abduction x10 reps; -hip  flexion march x2 reps; Standing exercise, discontinued due to increased buttocks pain;  Patient required min-moderate verbal/tactile cues for correct exercise technique. He also requires 2 HHA for balance control during LE movement;   Seated LAQ 3# x15 bilaterally;   PT recommended patient follow up with MD regarding continued buttocks pain. Also educated patient in importance of caring for self, concerned he is overdoing it with work, working 40+ hours a week. He reported that they have lost several staff and he has had to help fill in with more hours.   Final BP: 175/41; PT educated patient that the diastolic number was still low. Concerned he hasn't taken his new BP medication. Hopefully he will hear from Dr. Ouida Sills soon with guidance/suggestion regarding medication management;  PT Education - 06/15/20 1018    Education Details monitoring vitals, strengthening/balance, HEP    Person(s) Educated Patient    Methods Explanation;Verbal cues    Comprehension Verbalized understanding;Returned demonstration;Verbal cues required;Need further instruction            PT Short Term Goals - 05/20/20 1015      PT SHORT TERM GOAL #1   Title Patient will be adherent to HEP at least 3x a week to improve functional strength and balance for better safety at home.    Time 4    Period Weeks    Status New    Target Date 06/17/20      PT SHORT TERM GOAL #2   Title Patient will deny any falls over last 2 weeks to exhibit improvement in safety awareness and improved balance at home and at work.    Time 4    Period Weeks    Status New    Target Date 06/17/20             PT Long Term Goals - 05/20/20 1016      PT LONG TERM GOAL #1   Title Patient will increase Berg Balance score by > 6 points to demonstrate decreased fall risk during functional activities.    Time 8    Period Weeks    Status New    Target Date 07/15/20      PT LONG TERM  GOAL #2   Title Patient will increase 10 meter walk test to >0.8 m/s as to improve gait speed for better community ambulation and to reduce fall risk.    Time 8    Period Weeks    Status New    Target Date 07/15/20      PT LONG TERM GOAL #3   Title Patient will improve FOTO score to >60% to indicate improved functional mobility at home and at work.    Time 8    Period Weeks    Status New    Target Date 07/15/20      PT LONG TERM GOAL #4   Title Patient will reduce 5 times sit<>Stand to <20 sec to indicate improved transfer ability and improved LE functional strength.    Time 8    Period Weeks    Status New    Target Date 07/15/20                 Plan - 06/15/20 1123    Clinical Impression Statement Patient motivated and participated fair within session. His vitals were monitored, while his systolic BP was elevated his diastolic BP was low (low to mid 40's). PT did reach out to MD for guidance, awaiting response. He was instructed in LE strengthening exercise, but was limited with standing exercise due to increased buttocks pain. PT recommended patient follow up with PCP regarding buttocks pain. He has had x-rays but is unsure of results. PT also recommended patient discuss work situation with his boss/supervisor. Patient has had to put in extra hours due to limited staff. Concerned he could be overdoing it and with elevated buttocks pain and uncontrolled BP concerned about patient's overall health. Patient would benefit from additional skilled PT Intervention to improve strength, balance and mobility;    Personal Factors and Comorbidities Age;Comorbidity 3+;Past/Current Experience;Time since onset of injury/illness/exacerbation    Comorbidities Depression, HTN, DMx2, hypercholesterolemia, GERD, neuropathy    Examination-Activity Limitations Squat;Stairs;Locomotion Level;Stand;Transfers    Examination-Participation Restrictions Cleaning;Community Activity;Meal  Prep;Occupation;Shop;Volunteer;[employer] Work  Stability/Clinical Decision Making Evolving/Moderate complexity    Rehab Potential Fair    PT Frequency 2x / week    PT Duration 8 weeks    PT Treatment/Interventions Cryotherapy;Electrical Stimulation;Moist Heat;Gait training;Stair training;Functional mobility training;Therapeutic activities;Therapeutic exercise;Balance training;Neuromuscular re-education;Patient/family education;Orthotic Fit/Training;Energy conservation    PT Next Visit Plan complete Berg/initiate HEP    PT Home Exercise Plan will address next session;    Consulted and Agree with Plan of Care Patient           Patient will benefit from skilled therapeutic intervention in order to improve the following deficits and impairments:  Abnormal gait, Decreased balance, Decreased endurance, Decreased mobility, Difficulty walking, Decreased activity tolerance, Decreased strength  Visit Diagnosis: Unsteadiness on feet  History of falling     Problem List Patient Active Problem List   Diagnosis Date Noted  . Numbness and tingling of both legs 02/18/2020  . Primary osteoarthritis of left knee 01/04/2020  . Diabetic retinopathy ([place]) 01/01/2020  . ASCVD (arteriosclerotic cardiovascular disease) 10/16/2019  . Hospitalization within last 30 days 10/16/2019  . Hyperkalemia 10/14/2019  . Pseudoaneurysm of right femoral artery ([place]) 10/14/2019  . AKI (acute kidney injury) ([place]) 10/13/2019  . Unstable angina ([place]) 10/02/2019  . Chest pain 10/01/2019  . Uncontrolled type 2 diabetes mellitus with hyperglycemia, with long-term current use of insulin ([place]) 09/27/2019  . Chronic mesenteric ischemia ([place]) 05/06/2019  . Abdominal pain 05/03/2019  . Acute on chronic renal failure ([place]) 03/23/2019  . AMS (altered mental status) 12/24/2018  . Dizziness 06/30/2018  . HLD (hyperlipidemia) 02/23/2018  . Aortic atherosclerosis ([place]) 11/02/2017  . Healthcare maintenance 12/26/2016  . DM type 2  with diabetic peripheral neuropathy ([place]) 11/09/2016  . Presence of right artificial knee joint 08/14/2016  . Bilateral carotid artery disease ([place]) 07/12/2016  . 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 Acute diarrhea 05/30/2016  . Encephalopathy acute 05/30/2016  . Anemia 05/30/2016  . Acute on chronic renal insufficiency 05/30/2016  . Diarrhea 05/30/2016  . Depression, major, in remission ([place]) 08/02/2014  . Severe obesity (BMI 35.0-39.9) with comorbidity ([place]) 07/12/2014  . GERD (gastroesophageal reflux disease) 03/08/2014  . Hyperlipidemia associated with type 2 diabetes mellitus ([place]) 03/08/2014  . Drug-induced parkinsonism ([place]) 01/24/2014  . Diabetes mellitus with stage 3 chronic kidney disease ([place]) 01/21/2014  . HTN (hypertension), benign 01/21/2014  . OSA (obstructive sleep apnea) 01/21/2014  . Unspecified transient cerebral ischemia 05/29/2012    Izza Bickle PT, DPT 06/15/2020, 11:33 AM  [place] MAIN [facility] SERVICES [address] [place], [place], [address] Phone: [phone]   Fax:  [phone]  Name: Patrick Turner MRN: [id number] Date of Birth: 04-Jan-1946

## 2020-06-17 ENCOUNTER — Ambulatory Visit: Payer: Medicare HMO | Admitting: Physical Therapy

## 2020-06-18 ENCOUNTER — Other Ambulatory Visit: Payer: Self-pay | Admitting: *Deleted

## 2020-06-18 NOTE — Patient Outreach (Addendum)
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 Chronic mesenteric ischemia.FrequentFalls. peripheral neuropathy,retinopathy with intravitreal injections, Right5th metatarsal fracture, Iron deficiency anemia, 11/04/19 Aortogram and selective angiogram of celiac and superior mesentric artery. Angioplasty of celiac artery ED Visit : 03/21/20 at [facility] DX: Near Syncope, Chest pain, Hyperkalemia.   Subjective:  Successful outreach call to patient, he acknowledged missing recent calls, reports that he has been working 6 to 7 days a week. Developed very poor reception on mobile number, placed call to home number patient states that he has to provide transportation for his wife and agreeable to return call later today.   1230  Returned call to patient, he reviewed recent progress with Diabetes reports checking blood sugar daily, reading staying in a good range, denies high or low blood sugar readings.  Patient reports having recent fall in the last month followed up with PCP and reports having discomfort in pelvic buttock area especially after awakening in morning. He denies having fall in the last week, he reports attending outpatient physical therapy to help with his balance due to neuropathy.  He discussed concerns with recent blood pressure elevation at physical therapy he has followed up with MD adjustments to blood pressure medications and reports some improvement reading this am 157/69,He has follow up visit with Cardiology on tomorrow Dr. Chancy Milroy.    Encounter Medications:  Outpatient Encounter Medications as of 06/18/2020  Medication Sig  . carvedilol (COREG) 6.25 MG tablet  Take 6.25 mg by mouth 2 (two) times daily with a meal.  . ACCU-CHEK AVIVA PLUS test strip Use to test blood sugar twice daily  . Accu-Chek Softclix Lancets lancets Use to test blood sugar twice daily  . acetaminophen (TYLENOL) 325 MG tablet Take 2 tablets (650 mg total) by mouth every 6 (six) hours as needed for mild pain (or Fever >/= 101).  Marland Kitchen ascorbic acid (VITAMIN C) 500 MG tablet Take by mouth.  Marland Kitchen aspirin 81 MG tablet Take 1 tablet (81 mg total) by mouth daily.  Marland Kitchen atorvastatin (LIPITOR) 80 MG tablet Take 80 mg by mouth at bedtime.  . clindamycin (CLEOCIN) 300 MG capsule Take by mouth.  . clonazePAM (KLONOPIN) 1 MG tablet Take 0.5 mg by mouth at bedtime as needed for anxiety.   . clopidogrel (PLAVIX) 75 MG tablet Take 1 tablet (75 mg total) by mouth daily. (Patient not taking: Reported on 02/18/2020)  . gabapentin (NEURONTIN) 400 MG capsule Take 800 mg by mouth at bedtime.   . hydrALAZINE (APRESOLINE) 25 MG tablet Take 25 mg by mouth 2 (two) times daily.  . insulin detemir (LEVEMIR) 100 UNIT/ML injection Inject 15 Units into the skin daily.  . iron polysaccharides (NIFEREX) 150 MG capsule Take 150 mg by mouth daily.  Marland Kitchen lisinopril (ZESTRIL) 10 MG tablet Take 10 mg by mouth daily.   . metFORMIN (GLUCOPHAGE-XR) 500 MG 24 hr tablet Take 500 mg by mouth 2 (two) times daily with a meal. 2 tablets twice daily with meals  . metoprolol tartrate (LOPRESSOR) 25 MG tablet Take 0.5 tablets (12.5 mg total) by mouth 2 (two) times daily. (Patient taking differently: Take 25 mg by mouth at bedtime. )  . Omega-3 Fatty Acids (FISH OIL) 1000 MG CAPS  Take 1,000 mg by mouth 2 (two) times daily.   Marland Kitchen OZEMPIC, 0.25 OR 0.5 MG/DOSE, 2 MG/1.5ML SOPN Inject 1 Dose into the skin once a week. Wednesdays  . pantoprazole (PROTONIX) 40 MG tablet Take 40 mg by mouth daily.  Marland Kitchen PARoxetine (PAXIL) 40 MG tablet Take 40 mg by mouth daily.   . pioglitazone (ACTOS) 15 MG tablet Take 15 mg by mouth daily.   . QUEtiapine (SEROQUEL) 100  MG tablet Take 100 mg by mouth at bedtime.  . ranolazine (RANEXA) 500 MG 12 hr tablet Take 1 tablet by mouth 2 (two) times daily.  . vitamin B-12 (CYANOCOBALAMIN) 1000 MCG tablet Take 1,000 mcg by mouth daily.  . Vitamin D, Ergocalciferol, (DRISDOL) 1.25 MG (50000 UNIT) CAPS capsule    No facility-administered encounter medications on file as of 06/18/2020.    Functional Status:  In your present state of health, do you have any difficulty performing the following activities: 11/04/2019 10/16/2019  Hearing? Y Y  Comment - wears hearing aide  Vision? N N  Difficulty concentrating or making decisions? N N  Walking or climbing stairs? Y Y  Comment - fall risk, uses cane/walker  Dressing or bathing? N N  Doing errands, shopping? - Scientist, forensic and eating ? - N  Using the Toilet? - N  In the past six months, have you accidently leaked urine? - N  Do you have problems with loss of bowel control? - N  Managing your Medications? - N  Managing your Finances? - N  Housekeeping or managing your Housekeeping? - Y  Comment - wife assist  Some recent data might be hidden    Fall/Depression Screening: Fall Risk  10/11/2019  Falls in the past year? 1  Number falls in past yr: 1  Injury with Fall? 0  Risk for fall due to : History of fall(s);Impaired balance/gait  Follow up Falls prevention discussed   PHQ 2/9 Scores 10/11/2019  PHQ - 2 Score 0    Assessment:   Diabetes Improved reading reviewed range reports today's reading at 125, he denies hypo or hyperglycemic episodes. He reports having a new meter for monitoring reading. Improvement in balanced meals throughout the day. Will benefit from ongoing education and support. He had annual Flu vaccine in the last month, continues with scheduled eye exams and treatment for retinopathy . Has follow up with Endocrinology in next month for A1c check.   Hypertension  Reviewed recent readings, verbalizing new medication plan of adding coreg. Has  new blood pressure monitor for daily readings. Will benefit from ongoing education and support on blood pressure management , adherence to medication plan .  High Fall Risk Patient with continued episode of fall occurrences, denies dizziness associated with falls, he is attending outpatient therapy session. Will benefit from ongoing fall prevention support, follow up with MD regarding increased fall or injury . Patient still driving locally, and working [employer] job at least 5 days a week.    Plan:  Will follow up with patient in the next 2 months Will send PCP this visit note for quarterly update   Joylene Draft, RN, BSN  [place] Management Coordinator  [phone]- Mobile [phone]- Toll Free Main Office   Patient Care Plan: Diabetes Type 2 (Adult)    Problem Identified: Glycemic Management (Diabetes, Type 2)   Priority: High  Onset Date: 06/18/2020    Long-Range Goal: Glycemic Management Optimized   Start Date: 06/18/2020  Expected End Date: 08/27/2020  This Visit's Progress: On track  Priority: High  Note:   Evidence-based guidance:  Anticipate A1C testing (point-of-care) every 3 to 6 months based on goal attainment.  Review mutually-set A1C goal or target range.  Anticipate use of antihyperglycemic with or without insulin and periodic adjustments; consider active involvement of pharmacist.  Provide medical nutrition therapy and development of individualized eating.  Compare self-reported symptoms of hypo or hyperglycemia to blood glucose levels, diet and fluid intake, current medications, psychosocial and physiologic stressors, change in activity and barriers to care adherence.  Promote self-monitoring of blood glucose levels.  Assess and address barriers to management plan, such as food insecurity, age, developmental ability, depression, anxiety, fear of hypoglycemia or weight gain, as well as medication cost, side effects and complicated regimen.    Encourage regular dental care for treatment of periodontal disease; refer to dental provider when needed.   Notes: monitor blood sugar daily    Task: Alleviate Barriers to Glycemic Management   Due Date: 08/27/2020  Priority: Today  Note:   Care Management Activities:    - blood glucose monitoring encouraged - blood glucose readings reviewed - mutual A1C goal set or reviewed - use of blood glucose monitoring log promoted    Notes: keep appointment in November for A1c chek    Problem Identified: Disease Progression (Diabetes, Type 2)   Priority: High  Onset Date: 06/18/2020    Long-Range Goal: Disease Progression Prevented or Minimized   Start Date: 06/18/2020  Expected End Date: 08/27/2020  This Visit's Progress: On track  Priority: High  Note:   Evidence-based guidance:  Prepare patient for laboratory and diagnostic exams based on risk and presentation.  Encourage lifestyle changes, such as increased intake of plant-based foods, stress reduction, consistent physical activity and smoking cessation to prevent long-term complications and chronic disease.   Individualize activity and exercise recommendations while considering potential limitations, such as neuropathy, retinopathy or the ability to prevent hyperglycemia or hypoglycemia.   Prepare patient for use of pharmacologic therapy that may include antihypertensive, analgesic, prostaglandin E1 with periodic adjustments, based on presenting chronic condition and laboratory results.  Assess signs/symptoms and risk factors for hypertension, sleep-disordered breathing, neuropathy (including changes in gait and balance), retinopathy, nephropathy and sexual dysfunction.  Ensure completion of annual comprehensive foot exam and dilated eye exam.   Implement additional individualized goals and interventions based on identified risk factors.  Prepare patient for consultation or referral for specialist care, such as ophthalmology,  neurology, cardiology, podiatry, nephrology    Notes: Attend scheduled outpatient therapy to improve balance and fall prevention    Task: Monitor and Manage Follow-up for Comorbidities   Due Date: 08/27/2020  Priority: Routine  Note:   Care Management Activities:    - activity based on tolerance and functional limitations encouraged - completion of annual dilated eye exam confirmed - completion of annual foot exam verified - healthy lifestyle promoted - vital signs and trends reviewed    Notes: Fall prevention measures, use of cane for assistance, change positions slowly, keep all therapy appointments. Notify MD of falls with injury.    Patient Care Plan: Hypertension (Adult)    Problem Identified: Hypertension (Hypertension)   Priority: High  Onset Date: 06/18/2020    Long-Range Goal: Hypertension Monitored   Start Date: 06/18/2020  This Visit's Progress: On track  Priority: High  Note:   Evidence-based guidance:  Promote initial use of ambulatory blood pressure measurements (for 3 days) to rule out "white-coat" effect; identify masked hypertension and  presence or absence of nocturnal "dipping" of blood pressure.   Encourage continued use of home blood pressure monitoring and recording in blood pressure log; include symptoms of hypotension or potential medication side effects in log.  Review blood pressure measurements taken inside and outside of the provider office; establish baseline and monitor trends; compare to target ranges or patient goal.  Share overall cardiovascular risk with patient; encourage changes to lifestyle risk factors, including alcohol consumption, smoking, inadequate exercise, poor dietary habits and stress.   Notes: Continue daily blood pressure readings    Task: Identify and Monitor Blood Pressure Elevation   Due Date: 08/27/2020  Priority: Today  Note:   Care Management Activities:    - blood pressure equipment and technique reviewed - blood pressure  trends reviewed - home or ambulatory blood pressure monitoring encouraged    Notes:    Problem Identified: Disease Progression (Hypertension)   Priority: High  Onset Date: 06/18/2020    Long-Range Goal: Disease Progression Prevented or Minimized   Start Date: 06/18/2020  This Visit's Progress: On track  Priority: High  Note:   Evidence-based guidance:  Tailor lifestyle advice to individual; review progress regularly; give frequent encouragement and respond positively to incremental successes.  Assess for and promote awareness of worsening disease or development of comorbidity.  Prepare patient for laboratory and diagnostic exams based on risk and presentation.  Prepare patient for use of pharmacologic therapy that may include diuretic, beta-blocker, beta-blocker/thiazide combination, angiotensin-converting enzyme inhibitor, renin-angiotensin blocker or calcium-channel blocker.  Expect periodic adjustments to pharmacologic therapy; manage side effects.  Promote a healthy diet that includes primarily plant-based foods, such as fruits, vegetables, whole grains, beans and legumes, low-fat dairy and lean meats.   Consider moderate reduction in sodium intake by avoiding the addition of salt to prepared foods and limiting processed meats, canned soup, frozen meals and salty snacks.   Promote a regular, daily exercise goal of 150 minutes per week of moderate exercise based on tolerance, ability and patient choice; consider referral to physical therapist, community wellness and/or activity program.  Encourage the avoidance of no more than 2 hours per day of sedentary activity, such as recreational screen time.  Review sources of stress; explore current coping strategies and encourage use of mindfulness, yoga, meditation or exercise to manage stress.   Notes:    Task: Alleviate Barriers to Hypertension Treatment   Due Date: 08/27/2020  Priority: Routine  Note:   Care Management Activities:    -  healthy diet promoted - pain assessed and managed - reduction of dietary sodium encouraged    Notes:    Problem Identified: Resistant Hypertension (Hypertension)   Priority: High  Onset Date: 06/18/2020    Long-Range Goal: Response to Treatment Maximized   Start Date: 06/18/2020  Expected End Date: 08/27/2020  Priority: High  Note:   Evidence-based guidance:  Assess patient response to treatment, including presence or absence of medication side effects, degree of blood pressure control and patient satisfaction.  Assess technique (including cuff size and placement), measurement times, condition and calibration of blood pressure cuff set (both at-home and in-office equipment).  Assess factors that may influence response to treatment, including nonadherence to pharmacologic treatment plan, diet or activity changes and/or presence of pain, stress or sleep disturbance.  Evaluate social and economic barriers that may affect adherence to treatment plan  Address pharmacologic nonadherence by simplifying dosing regimen, counseling or support by pharmacist, financial assistance, self-monitoring of blood pressure, use of motivational interviewing, voice or text  messages.  Encourage behavioral adherence strategies, like habit-based interventions that link medication taking with existing daily routines.  Assess barriers to regular, daily physical activity; support family or support person-oriented activity changes and utilization of community activity or sports program.  Address barriers to dietary changes, especially sodium restriction, with referrals to community programs, like cooking classes, meal services or intensive education when available.  Assess for chronic pain; when present add additional goals (Chronic Pain Care Plan Guide) as needed.  Provide frequent follow-up by telephone, telemonitoring, patient-practice portal or with home visit.   Notes: Encouraged to take monitor to MD visit     Task: Facilitate Adherence to Lifestyle Change   Due Date: 08/27/2020  Priority: Routine  Note:   Care Management Activities:    - home blood pressure monitoring technique reviewed - pain assessed and managed - success praised - support and encouragement provided    Notes:

## 2020-06-22 ENCOUNTER — Other Ambulatory Visit: Payer: Self-pay

## 2020-06-22 ENCOUNTER — Encounter: Payer: Self-pay | Admitting: Physical Therapy

## 2020-06-22 ENCOUNTER — Ambulatory Visit: Payer: Medicare HMO | Admitting: Physical Therapy

## 2020-06-22 VITALS — BP 197/57

## 2020-06-22 DIAGNOSIS — E782 Mixed hyperlipidemia: Secondary | ICD-10-CM | POA: Diagnosis not present

## 2020-06-22 DIAGNOSIS — R2681 Unsteadiness on feet: Secondary | ICD-10-CM | POA: Diagnosis not present

## 2020-06-22 DIAGNOSIS — I2510 Atherosclerotic heart disease of native coronary artery without angina pectoris: Secondary | ICD-10-CM | POA: Diagnosis not present

## 2020-06-22 DIAGNOSIS — I10 Essential (primary) hypertension: Secondary | ICD-10-CM | POA: Diagnosis not present

## 2020-06-22 DIAGNOSIS — Z9181 History of falling: Secondary | ICD-10-CM

## 2020-06-22 DIAGNOSIS — I351 Nonrheumatic aortic (valve) insufficiency: Secondary | ICD-10-CM | POA: Diagnosis not present

## 2020-06-22 DIAGNOSIS — I340 Nonrheumatic mitral (valve) insufficiency: Secondary | ICD-10-CM | POA: Diagnosis not present

## 2020-06-22 DIAGNOSIS — R0602 Shortness of breath: Secondary | ICD-10-CM | POA: Diagnosis not present

## 2020-06-22 NOTE — Therapy (Signed)
[place] MAIN [facility] SERVICES [address] [place], [place], [address] Phone: [phone]   Fax:  [phone]  Physical Therapy Treatment/Discharge Summary  Patient Details  Name: Patrick Turner MRN: [id number] Date of Birth: 1945-10-15 Referring Provider (PT): Dr. Manuella Ghazi   Encounter Date: 06/22/2020   PT End of Session - 06/22/20 1028    Visit Number 6    Number of Visits 17    Date for PT Re-Evaluation 07/14/20    Authorization Type [employer], $40 copay    Authorization Time Period Authorized 16 visits from 9/9-11/4    Authorization - Visit Number 5    Authorization - Number of Visits 16    PT Start Time 1017    PT Stop Time 1100    PT Time Calculation (min) 43 min    Equipment Utilized During Treatment Gait belt    Activity Tolerance Patient tolerated treatment well    Behavior During Therapy WFL for tasks assessed/performed           Past Medical History:  Diagnosis Date  . Altered mental status   . Anemia   . Anxiety   . Aortic atherosclerosis ([place])   . Arthritis   . Coronary artery disease   . CRD (chronic renal disease)   . Depression   . Diabetes mellitus without complication ([place])   . Encephalopathy acute   . GERD (gastroesophageal reflux disease)   . Headache   . Hypercholesteremia   . Hypertension   . Neuropathy   . Severe obesity ([place])   . Sleep apnea   . Sleep terror    per patient this year per patient     Past Surgical History:  Procedure Laterality Date  . APPENDECTOMY    . CIRCUMCISION    . COLONOSCOPY    . COLONOSCOPY WITH PROPOFOL N/A 07/10/2018   Procedure: COLONOSCOPY WITH PROPOFOL;  Surgeon: Lollie Sails, MD;  Location: [facility] ENDOSCOPY;  Service: Endoscopy;  Laterality: N/A;  . COLONOSCOPY WITH PROPOFOL N/A 04/23/2019   Procedure: COLONOSCOPY WITH PROPOFOL;  Surgeon: Lollie Sails, MD;  Location: [facility] ENDOSCOPY;  Service: Endoscopy;  Laterality: N/A;  . CORONARY STENT INTERVENTION  N/A 10/03/2019   Procedure: CORONARY STENT INTERVENTION;  Surgeon: Yolonda Kida, MD;  Location: [place] CV LAB;  Service: Cardiovascular;  Laterality: N/A;  . ESOPHAGOGASTRODUODENOSCOPY    . ESOPHAGOGASTRODUODENOSCOPY (EGD) WITH PROPOFOL N/A 04/23/2019   Procedure: ESOPHAGOGASTRODUODENOSCOPY (EGD) WITH PROPOFOL;  Surgeon: Lollie Sails, MD;  Location: [facility] ENDOSCOPY;  Service: Endoscopy;  Laterality: N/A;  . JOINT REPLACEMENT    . 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 KNEE ARTHROSCOPY Right   . LEFT HEART CATH AND CORONARY ANGIOGRAPHY N/A 10/03/2019   Procedure: Left Heart Cath and possible Coronary intervention;  Surgeon: Dionisio David, MD;  Location: [place] CV LAB;  Service: Cardiovascular;  Laterality: N/A;  . TONSILLECTOMY    . VISCERAL ANGIOGRAPHY N/A 05/23/2019   Procedure: VISCERAL ANGIOGRAPHY;  Surgeon: Algernon Huxley, MD;  Location: [place] CV LAB;  Service: Cardiovascular;  Laterality: N/A;  . VISCERAL ANGIOGRAPHY N/A 11/04/2019   Procedure: VISCERAL ANGIOGRAPHY;  Surgeon: Algernon Huxley, MD;  Location: [place] CV LAB;  Service: Cardiovascular;  Laterality: N/A;    Vitals:   06/22/20 1024  BP: (!) 197/57     Subjective Assessment -  06/22/20 1024    Subjective Patient reports following up with MD regarding buttocks pain- he was diagnosed with fracture in bilateral lower pelvis, no treatment other than to take pain meds; He reports still having trouble with BP with it being up/down from time to time. He reports leaving work early yesterday with systolic >322    Pertinent History 74 yo Male reports history of imbalance. He is s/p RLE TKA approximately 3 years ago. He also reports history of neuropathy in BLE (R>L) which does affect his balance. His recent EMG study shows Right partial femoral neuropathy and  generalized length dependent peripheral neuropathy;  He presents to therapy with SPC. He reports not using cane at work so that he has use of UE for work tasks. 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 He has multiple bruises/abrasions on UE due to falls/stumbles. He is still working at [employer] and reports standing on them 6-7 hours a day. he reports increased pain when he sits down; Patient did receive outpatient PT earlier in the year for RLE weakness with no improvement; Therapy consisted of strengthening, ultrasound, with no improvement; He was given a HEP and reports he would do them sometimes; He is not doing them now.    How long can you sit comfortably? >1 hour    How long can you stand comfortably? will stand 6-7 hours a work; starts to have fatigue/discomforts after 2-3 hours;    How long can you walk comfortably? about 500 feet but staggers often/unstable;    Diagnostic tests EMG study in July 2021 shows RLE partial femoral neuropathy and length dependent peripheral neuropathy;    Patient Stated Goals improve walking, be able to walk without stumbling/staggering to the side;    Currently in Pain? Yes    Pain Score 4     Pain Location Buttocks    Pain Orientation Lower    Pain Descriptors / Indicators Aching;Sore    Pain Type Chronic pain    Pain Onset 1 to 4 weeks ago    Pain Frequency Intermittent    Aggravating Factors  worse with sitting/transfers/getting out of bed    Pain Relieving Factors standing/walking    Effect of Pain on Daily Activities decreased activity tolerance;    Multiple Pain Sites No               [facility] PT Assessment - 06/22/20 0001      Observation/Other Assessments   Focus on Therapeutic Outcomes (FOTO)  48.236 %           TREATMENT: Vitals assessed at start of session: Initial BP:  197/57 After 2-3 min rest: 186/ 54  Patient reports having BP >200 last night with dizziness and fatigue. Educated patient on importance of monitoring vitals and when systolic is high >025 and he is symptomatic such as dizziness, headache and weakness he should seek medical care;   He reports following up with cardiologist who is wanting him to bring his medication to next appointment next week so that he can review what he is taking and make adjustments;  Standing with green tband around BLE: -heel raises 3 sec hold x15 reps with reduced HHA to finger tip hold for better LE weight bearing; -hip abduction x15  reps; -hamstring curl x15 reps;  -without resistance, hip flexion march  with 1 rail assist x15 reps each Patient required min-moderate verbal/tactile cues for correct exercise technique. He also requires 2 HHA for balance control during LE movement;  BP after standing exercise: 179/46, HR 63    Patient expressed continued buttocks pain and is limited with weight bearing activity. Recommend discharge from PT at this time due to continued pain and uncontrolled BP. Recommend patient follow up with MD regarding BP for overall health. Instructed patient to call back when he feels ready to continue with PT. Patient agreeable at this time and agrees that discharge is the best for right now until he can get other issues under control.                        PT Education - 06/22/20 1028    Education Details monitoring vitals, strengthening/balance, HEP    Person(s) Educated Patient    Methods Explanation;Verbal cues    Comprehension Verbalized understanding;Returned demonstration;Verbal cues required;Need further instruction            PT Short Term Goals - 06/22/20 1145      PT SHORT TERM GOAL #1   Title Patient will be adherent to HEP at least 3x a week to improve functional strength and balance for better safety at home.    Time 4    Period Weeks    Status  Not Met    Target Date 06/17/20      PT SHORT TERM GOAL #2   Title Patient will deny any falls over last 2 weeks to exhibit improvement in safety awareness and improved balance at home and at work.    Time 4    Period Weeks    Status Partially Met    Target Date 06/17/20             PT Long Term Goals - 06/22/20 1145      PT LONG TERM GOAL #1   Title Patient will increase Berg Balance score by > 6 points to demonstrate decreased fall risk during functional activities.    Time 8    Period Weeks    Status Not Met    Target Date 07/15/20      PT LONG TERM GOAL #2   Title Patient will increase 10 meter walk test to >0.8 m/s as to improve gait speed for better community ambulation and to reduce fall risk.    Time 8    Period Weeks    Status Not Met    Target Date 07/15/20      PT LONG TERM GOAL #3   Title Patient will improve FOTO score to >60% to indicate improved functional mobility at home and at work.    Time 8    Period Weeks    Status Not Met    Target Date 07/15/20      PT LONG TERM GOAL #4   Title Patient will reduce 5 times sit<>Stand to <20 sec to indicate improved transfer ability and improved LE functional strength.    Time 8    Period Weeks    Status Not Met    Target Date 07/15/20                 Plan - 06/22/20 1046    Clinical Impression Statement Patient motivated and participated fair within session. He is still having increased buttocks pain which he reports is resultant from fracture of lower pelvis. He reports his MD states there is nothing  they can do and that it should heal on its own with no restrictions or activity modifications. Patient does report feeling more unsteady with increased buttocks pain; Vitals monitored this session with elevated systolic noted. Patient reports he is taking his BP meds but is not following MD prescription rather is taking one lisinopril in the morning and the new medication in the evening. He is supposed to  follow up with cardiologist next week regarding medication management; He does require min VCs for proper exercise technique and positioning. Patient does have difficulty with weight bearing in BLE due to increased buttocks pain. Recommend discharge from PT at this time due to high levels of pain, uncontrolled BP. Patient is overwhelmed with multiple MD appointments and needs time to recover as well as get BP under control. He is agreeable to discharge at this time.    Personal Factors and Comorbidities Age;Comorbidity 3+;Past/Current Experience;Time since onset of injury/illness/exacerbation    Comorbidities Depression, HTN, DMx2, hypercholesterolemia, GERD, neuropathy    Examination-Activity Limitations Squat;Stairs;Locomotion Level;Stand;Transfers    Examination-Participation Restrictions Cleaning;Community Activity;Meal Prep;Occupation;Shop;Volunteer;Yard Work    [employer] Evolving/Moderate complexity    Rehab Potential Fair    PT Frequency 2x / week    PT Duration 8 weeks    PT Treatment/Interventions Cryotherapy;Electrical Stimulation;Moist Heat;Gait training;Stair training;Functional mobility training;Therapeutic activities;Therapeutic exercise;Balance training;Neuromuscular re-education;Patient/family education;Orthotic Fit/Training;Energy conservation    PT Next Visit Plan complete Berg/initiate HEP    PT Home Exercise Plan will address next session;    Consulted and Agree with Plan of Care Patient           Patient will benefit from skilled therapeutic intervention in order to improve the following deficits and impairments:  Abnormal gait, Decreased balance, Decreased endurance, Decreased mobility, Difficulty walking, Decreased activity tolerance, Decreased strength  Visit Diagnosis: Unsteadiness on feet  History of falling     Problem List Patient Active Problem List   Diagnosis Date Noted  . Numbness and tingling of both legs 02/18/2020  . Primary  osteoarthritis of left knee 01/04/2020  . Diabetic retinopathy ([place]) 01/01/2020  . ASCVD (arteriosclerotic cardiovascular disease) 10/16/2019  . Hospitalization within last 30 days 10/16/2019  . Hyperkalemia 10/14/2019  . Pseudoaneurysm of right femoral artery ([place]) 10/14/2019  . AKI (acute kidney injury) ([place]) 10/13/2019  . Unstable angina ([place]) 10/02/2019  . Chest pain 10/01/2019  . Uncontrolled type 2 diabetes mellitus with hyperglycemia, with long-term current use of insulin ([place]) 09/27/2019  . Chronic mesenteric ischemia ([place]) 05/06/2019  . Abdominal pain 05/03/2019  . Acute on chronic renal failure ([place]) 03/23/2019  . AMS (altered mental status) 12/24/2018  . Dizziness 06/30/2018  . HLD (hyperlipidemia) 02/23/2018  . Aortic atherosclerosis ([place]) 11/02/2017  . Healthcare maintenance 12/26/2016  . DM type 2 with diabetic peripheral neuropathy ([place]) 11/09/2016  . Presence of right artificial knee joint 08/14/2016  . Bilateral carotid artery disease ([place]) 07/12/2016  . 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 Acute diarrhea 05/30/2016  . Encephalopathy acute 05/30/2016  . Anemia 05/30/2016  . Acute on chronic renal insufficiency 05/30/2016  . Diarrhea 05/30/2016  . Depression, major, in remission ([place]) 08/02/2014  . Severe obesity (BMI 35.0-39.9) with comorbidity ([place]) 07/12/2014  . GERD (gastroesophageal reflux disease) 03/08/2014  . Hyperlipidemia associated with type 2 diabetes mellitus ([place]) 03/08/2014  . Drug-induced parkinsonism ([place]) 01/24/2014  . Diabetes mellitus with stage 3 chronic kidney disease ([place]) 01/21/2014  . HTN (hypertension), benign 01/21/2014  . OSA (obstructive sleep apnea) 01/21/2014  . Unspecified transient  cerebral ischemia 05/29/2012    Julie Nay PT, DPT 06/22/2020, 12:42 PM  [place] MAIN [facility] SERVICES [address] [place], [place], [address] Phone: [phone]   Fax:  [phone]  Name:  Patrick Turner MRN: [id number] Date of Birth: August 17, 1946

## 2020-06-24 ENCOUNTER — Ambulatory Visit: Payer: Medicare HMO | Admitting: Physical Therapy

## 2020-06-25 DIAGNOSIS — Z96651 Presence of right artificial knee joint: Secondary | ICD-10-CM | POA: Diagnosis not present

## 2020-06-25 DIAGNOSIS — S32592S Other specified fracture of left pubis, sequela: Secondary | ICD-10-CM | POA: Diagnosis not present

## 2020-06-25 DIAGNOSIS — M25552 Pain in left hip: Secondary | ICD-10-CM | POA: Diagnosis not present

## 2020-06-29 ENCOUNTER — Ambulatory Visit: Payer: Medicare HMO | Admitting: Physical Therapy

## 2020-06-29 DIAGNOSIS — I2510 Atherosclerotic heart disease of native coronary artery without angina pectoris: Secondary | ICD-10-CM | POA: Diagnosis not present

## 2020-06-29 DIAGNOSIS — I10 Essential (primary) hypertension: Secondary | ICD-10-CM | POA: Diagnosis not present

## 2020-06-29 DIAGNOSIS — I351 Nonrheumatic aortic (valve) insufficiency: Secondary | ICD-10-CM | POA: Diagnosis not present

## 2020-06-29 DIAGNOSIS — E782 Mixed hyperlipidemia: Secondary | ICD-10-CM | POA: Diagnosis not present

## 2020-06-29 DIAGNOSIS — I340 Nonrheumatic mitral (valve) insufficiency: Secondary | ICD-10-CM | POA: Diagnosis not present

## 2020-06-30 ENCOUNTER — Ambulatory Visit (INDEPENDENT_AMBULATORY_CARE_PROVIDER_SITE_OTHER): Payer: Medicare HMO | Admitting: Vascular Surgery

## 2020-06-30 ENCOUNTER — Encounter (INDEPENDENT_AMBULATORY_CARE_PROVIDER_SITE_OTHER): Payer: Medicare HMO

## 2020-07-01 ENCOUNTER — Ambulatory Visit: Payer: Medicare HMO | Admitting: Physical Therapy

## 2020-07-02 DIAGNOSIS — D631 Anemia in chronic kidney disease: Secondary | ICD-10-CM | POA: Diagnosis not present

## 2020-07-02 DIAGNOSIS — N1832 Chronic kidney disease, stage 3b: Secondary | ICD-10-CM | POA: Diagnosis not present

## 2020-07-02 DIAGNOSIS — I129 Hypertensive chronic kidney disease with stage 1 through stage 4 chronic kidney disease, or unspecified chronic kidney disease: Secondary | ICD-10-CM | POA: Diagnosis not present

## 2020-07-02 DIAGNOSIS — E1122 Type 2 diabetes mellitus with diabetic chronic kidney disease: Secondary | ICD-10-CM | POA: Diagnosis not present

## 2020-07-02 DIAGNOSIS — R809 Proteinuria, unspecified: Secondary | ICD-10-CM | POA: Diagnosis not present

## 2020-07-02 DIAGNOSIS — E875 Hyperkalemia: Secondary | ICD-10-CM | POA: Diagnosis not present

## 2020-07-10 DIAGNOSIS — E1122 Type 2 diabetes mellitus with diabetic chronic kidney disease: Secondary | ICD-10-CM | POA: Diagnosis not present

## 2020-07-10 DIAGNOSIS — N183 Chronic kidney disease, stage 3 unspecified: Secondary | ICD-10-CM | POA: Diagnosis not present

## 2020-07-22 DIAGNOSIS — R2689 Other abnormalities of gait and mobility: Secondary | ICD-10-CM | POA: Diagnosis not present

## 2020-07-22 DIAGNOSIS — R200 Anesthesia of skin: Secondary | ICD-10-CM | POA: Diagnosis not present

## 2020-07-22 DIAGNOSIS — F323 Major depressive disorder, single episode, severe with psychotic features: Secondary | ICD-10-CM | POA: Diagnosis not present

## 2020-07-22 DIAGNOSIS — G4733 Obstructive sleep apnea (adult) (pediatric): Secondary | ICD-10-CM | POA: Diagnosis not present

## 2020-07-22 DIAGNOSIS — G2119 Other drug induced secondary parkinsonism: Secondary | ICD-10-CM | POA: Diagnosis not present

## 2020-07-22 DIAGNOSIS — R202 Paresthesia of skin: Secondary | ICD-10-CM | POA: Diagnosis not present

## 2020-07-31 DIAGNOSIS — Z9861 Coronary angioplasty status: Secondary | ICD-10-CM | POA: Diagnosis not present

## 2020-07-31 DIAGNOSIS — E782 Mixed hyperlipidemia: Secondary | ICD-10-CM | POA: Diagnosis not present

## 2020-07-31 DIAGNOSIS — I2510 Atherosclerotic heart disease of native coronary artery without angina pectoris: Secondary | ICD-10-CM | POA: Diagnosis not present

## 2020-07-31 DIAGNOSIS — I10 Essential (primary) hypertension: Secondary | ICD-10-CM | POA: Diagnosis not present

## 2020-07-31 DIAGNOSIS — F411 Generalized anxiety disorder: Secondary | ICD-10-CM | POA: Diagnosis not present

## 2020-08-14 DIAGNOSIS — G629 Polyneuropathy, unspecified: Secondary | ICD-10-CM | POA: Diagnosis not present

## 2020-08-14 DIAGNOSIS — Z96651 Presence of right artificial knee joint: Secondary | ICD-10-CM | POA: Diagnosis not present

## 2020-08-19 DIAGNOSIS — E113312 Type 2 diabetes mellitus with moderate nonproliferative diabetic retinopathy with macular edema, left eye: Secondary | ICD-10-CM | POA: Diagnosis not present

## 2020-08-21 ENCOUNTER — Encounter (INDEPENDENT_AMBULATORY_CARE_PROVIDER_SITE_OTHER): Payer: Self-pay | Admitting: Vascular Surgery

## 2020-08-21 ENCOUNTER — Other Ambulatory Visit: Payer: Self-pay

## 2020-08-21 ENCOUNTER — Ambulatory Visit (INDEPENDENT_AMBULATORY_CARE_PROVIDER_SITE_OTHER): Payer: Medicare HMO

## 2020-08-21 ENCOUNTER — Encounter (INDEPENDENT_AMBULATORY_CARE_PROVIDER_SITE_OTHER): Payer: Medicare HMO

## 2020-08-21 ENCOUNTER — Ambulatory Visit (INDEPENDENT_AMBULATORY_CARE_PROVIDER_SITE_OTHER): Payer: Medicare HMO | Admitting: Vascular Surgery

## 2020-08-21 VITALS — BP 187/71 | HR 68 | Resp 16 | Wt 191.2 lb

## 2020-08-21 DIAGNOSIS — I10 Essential (primary) hypertension: Secondary | ICD-10-CM

## 2020-08-21 DIAGNOSIS — E785 Hyperlipidemia, unspecified: Secondary | ICD-10-CM | POA: Diagnosis not present

## 2020-08-21 DIAGNOSIS — K551 Chronic vascular disorders of intestine: Secondary | ICD-10-CM | POA: Diagnosis not present

## 2020-08-21 DIAGNOSIS — E1142 Type 2 diabetes mellitus with diabetic polyneuropathy: Secondary | ICD-10-CM

## 2020-08-21 DIAGNOSIS — I6523 Occlusion and stenosis of bilateral carotid arteries: Secondary | ICD-10-CM

## 2020-08-21 NOTE — Progress Notes (Signed)
MRN : [id number]  Patrick Turner is a 74 y.o. (1946/05/21) male who presents with chief complaint of  Chief Complaint  Patient presents with  . Mesenteric Insufficiency  .  History of Present Illness: Patient returns today in follow up of his mesenteric disease.  He has been having a lot of falls recently but is not having a lot of abdominal pain, food fear, or postprandial symptoms.  No unintentional weight loss either.  His duplex today does show that he has had some increased velocities in his SMA stent that would be consistent with a greater than 70% stenosis.  His celiac velocities are also elevated today.   Current Outpatient Medications  Medication Sig Dispense Refill  . ACCU-CHEK AVIVA PLUS test strip Use to test blood sugar twice daily    . Accu-Chek Softclix Lancets lancets Use to test blood sugar twice daily    . acetaminophen (TYLENOL) 325 MG tablet Take 2 tablets (650 mg total) by mouth every 6 (six) hours as needed for mild pain (or Fever >/= 101). 50 tablet 0  . ascorbic acid (VITAMIN C) 500 MG tablet Take by mouth.    Marland Kitchen aspirin 81 MG tablet Take 1 tablet (81 mg total) by mouth daily.    Marland Kitchen atorvastatin (LIPITOR) 80 MG tablet Take 80 mg by mouth at bedtime.    . carvedilol (COREG) 6.25 MG tablet Take 6.25 mg by mouth 2 (two) times daily with a meal.    . clindamycin (CLEOCIN) 300 MG capsule Take by mouth.    . clonazePAM (KLONOPIN) 1 MG tablet Take 0.5 mg by mouth at bedtime as needed for anxiety.   2  . gabapentin (NEURONTIN) 400 MG capsule Take 800 mg by mouth at bedtime.     . hydrALAZINE (APRESOLINE) 25 MG tablet Take 25 mg by mouth 2 (two) times daily.    . insulin detemir (LEVEMIR) 100 UNIT/ML injection Inject 15 Units into the skin daily.    . iron polysaccharides (NIFEREX) 150 MG capsule Take 150 mg by mouth daily.    Marland Kitchen lisinopril (ZESTRIL) 10 MG tablet Take 10 mg by mouth daily.     . metFORMIN (GLUCOPHAGE-XR) 500 MG 24 hr tablet Take 500 mg by mouth 2 (two)  times daily with a meal. 2 tablets twice daily with meals    . metoprolol tartrate (LOPRESSOR) 25 MG tablet Take 0.5 tablets (12.5 mg total) by mouth 2 (two) times daily. (Patient taking differently: Take 25 mg by mouth at bedtime.) 60 tablet 0  . Omega-3 Fatty Acids (FISH OIL) 1000 MG CAPS Take 1,000 mg by mouth 2 (two) times daily.     Marland Kitchen OZEMPIC, 0.25 OR 0.5 MG/DOSE, 2 MG/1.5ML SOPN Inject 1 Dose into the skin once a week. Wednesdays    . pantoprazole (PROTONIX) 40 MG tablet Take 40 mg by mouth daily.    Marland Kitchen PARoxetine (PAXIL) 40 MG tablet Take 40 mg by mouth daily.     . pioglitazone (ACTOS) 15 MG tablet Take 15 mg by mouth daily.     . QUEtiapine (SEROQUEL) 100 MG tablet Take 100 mg by mouth at bedtime.    . ranolazine (RANEXA) 500 MG 12 hr tablet Take 1 tablet by mouth 2 (two) times daily.    . vitamin B-12 (CYANOCOBALAMIN) 1000 MCG tablet Take 1,000 mcg by mouth daily.    . Vitamin D, Ergocalciferol, (DRISDOL) 1.25 MG (50000 UNIT) CAPS capsule     . clopidogrel (PLAVIX) 75 MG tablet Take  1 tablet (75 mg total) by mouth daily. (Patient not taking: No sig reported) 30 tablet 11   No current facility-administered medications for this visit.    Past Medical History:  Diagnosis Date  . Altered mental status   . Anemia   . Anxiety   . Aortic atherosclerosis ([place])   . Arthritis   . Coronary artery disease   . CRD (chronic renal disease)   . Depression   . Diabetes mellitus without complication ([place])   . Encephalopathy acute   . GERD (gastroesophageal reflux disease)   . Headache   . Hypercholesteremia   . Hypertension   . Neuropathy   . Severe obesity ([place])   . Sleep apnea   . Sleep terror    per patient this year per patient     Past Surgical History:  Procedure Laterality Date  . APPENDECTOMY    . CIRCUMCISION    . COLONOSCOPY    . COLONOSCOPY WITH PROPOFOL N/A 07/10/2018   Procedure: COLONOSCOPY WITH PROPOFOL;  Surgeon: Lollie Sails, MD;  Location: [facility] ENDOSCOPY;   Service: Endoscopy;  Laterality: N/A;  . COLONOSCOPY WITH PROPOFOL N/A 04/23/2019   Procedure: COLONOSCOPY WITH PROPOFOL;  Surgeon: Lollie Sails, MD;  Location: [facility] ENDOSCOPY;  Service: Endoscopy;  Laterality: N/A;  . CORONARY STENT INTERVENTION N/A 10/03/2019   Procedure: CORONARY STENT INTERVENTION;  Surgeon: Yolonda Kida, MD;  Location: [place] CV LAB;  Service: Cardiovascular;  Laterality: N/A;  . ESOPHAGOGASTRODUODENOSCOPY    . ESOPHAGOGASTRODUODENOSCOPY (EGD) WITH PROPOFOL N/A 04/23/2019   Procedure: ESOPHAGOGASTRODUODENOSCOPY (EGD) WITH PROPOFOL;  Surgeon: Lollie Sails, MD;  Location: [facility] ENDOSCOPY;  Service: Endoscopy;  Laterality: N/A;  . JOINT REPLACEMENT    . 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 KNEE ARTHROSCOPY Right   . LEFT HEART CATH AND CORONARY ANGIOGRAPHY N/A 10/03/2019   Procedure: Left Heart Cath and possible Coronary intervention;  Surgeon: Dionisio David, MD;  Location: [place] CV LAB;  Service: Cardiovascular;  Laterality: N/A;  . TONSILLECTOMY    . VISCERAL ANGIOGRAPHY N/A 05/23/2019   Procedure: VISCERAL ANGIOGRAPHY;  Surgeon: Algernon Huxley, MD;  Location: [place] CV LAB;  Service: Cardiovascular;  Laterality: N/A;  . VISCERAL ANGIOGRAPHY N/A 11/04/2019   Procedure: VISCERAL ANGIOGRAPHY;  Surgeon: Algernon Huxley, MD;  Location: [place] CV LAB;  Service: Cardiovascular;  Laterality: N/A;     Social History   Tobacco Use  . Smoking status: Never Smoker  . Smokeless tobacco: Never Used  Vaping Use  . Vaping Use: Never used  Substance Use Topics  . Alcohol use: Yes    Comment: rare  . Drug use: No      Family History  Problem Relation Age of Onset  . Diabetes Mother   . Heart disease Mother   . Cancer Father   . COPD Father      Allergies  Allergen Reactions  .  Penicillins Anaphylaxis    Has patient had a PCN reaction causing immediate rash, facial/tongue/throat swelling, SOB or lightheadedness with hypotension: Yes Has patient had a PCN reaction causing severe rash involving mucus membranes or skin necrosis: No Has patient had a PCN reaction that required hospitalization Yes Has patient had a PCN reaction occurring within the last 10 years: No If all of the above answers are "NO", then may proceed with Cephalosporin  use.  . Tavist [Clemastine] Swelling    REVIEW OF SYSTEMS(Negative unless checked)  Constitutional: [x] ?????Weight loss [] ?????Fever [] ?????Chills Cardiac: [] ?????Chest pain [] ?????Chest pressure [] ?????Palpitations [] ?????Shortness of breath when laying flat [] ?????Shortness of breath at rest [] ?????Shortness of breath with exertion. Vascular: [] ?????Pain in legs with walking [] ?????Pain in legs at rest [] ?????Pain in legs when laying flat [] ?????Claudication [] ?????Pain in feet when walking [] ?????Pain in feet at rest [] ?????Pain in feet when laying flat [] ?????History of DVT [] ?????Phlebitis [] ?????Swelling in legs [] ?????Varicose veins [] ?????Non-healing ulcers Pulmonary: [] ?????Uses home oxygen [] ?????Productive cough [] ?????Hemoptysis [] ?????Wheeze [] ?????COPD [] ?????Asthma Neurologic: [] ?????Dizziness [] ?????Blackouts [] ?????Seizures [] ?????History of stroke [] ?????History of TIA [] ?????Aphasia [] ?????Temporary blindness [] ?????Dysphagia [] ?????Weakness or numbness in arms [] ?????Weakness or numbness in legs Musculoskeletal: [x] ?????Arthritis [] ?????Joint swelling [x] ?????Joint pain [] ?????Low back pain Hematologic: [] ?????Easy bruising [] ?????Easy bleeding [] ?????Hypercoagulable state [] ?????Anemic  Gastrointestinal: [] ?????Blood in stool [] ?????Vomiting blood [x] ?????Gastroesophageal reflux/heartburn [x] ?????Abdominal pain Genitourinary:  [] ?????Chronic kidney disease [] ?????Difficult urination [] ?????Frequent urination [] ?????Burning with urination [] ?????Hematuria Skin: [] ?????Rashes [] ?????Ulcers [] ?????Wounds Psychological: [] ?????History of anxiety [] ?????History of major depression.  Physical Examination  BP (!) 187/71 (BP Location: Right Arm)   Pulse 68   Resp 16   Wt 191 lb 3.2 oz (86.7 kg)   BMI 31.82 kg/m  Gen:  WD/WN, NAD Head: [place]/AT, No temporalis wasting. Ear/Nose/Throat: Hearing grossly intact, nares w/o erythema or drainage Eyes: Conjunctiva clear. Sclera non-icteric Neck: Supple.  Trachea midline Pulmonary:  Good air movement, no use of accessory muscles.  Cardiac: irregular Vascular:  Vessel Right Left  Radial Palpable Palpable                   Gastrointestinal: soft, non-tender/non-distended. No guarding/reflex.  Musculoskeletal: M/S 5/5 throughout.  No deformity or atrophy. Mild LE edema. Neurologic: Sensation grossly intact in extremities.  Symmetrical.  Speech is fluent.  Psychiatric: Judgment intact, Mood & affect appropriate for pt's clinical situation. Dermatologic: No rashes or ulcers noted.  No cellulitis or open wounds.       Labs No results found for this or any previous visit (from the past 2160 hour(s)).  Radiology No results found.  Assessment/Plan HTN (hypertension), benign blood pressure control important in reducing the progression of atherosclerotic disease. On appropriate oral medications.   DM type 2 with diabetic peripheral neuropathy (HCC) blood glucose control important in reducing the progression of atherosclerotic disease. Also, involved in wound healing. On appropriate medications.  HLD (hyperlipidemia) lipid control important in reducing the progression of atherosclerotic disease. Continue statin therapy  Bilateral carotid artery disease ([place]) The last check I see was from 2019 with mild disease seen at that time.  With his recent  falls, it is certainly reasonable to do another carotid duplex at his next follow-up visit.  Chronic mesenteric ischemia ([place]) His duplex today does show that he has had some increased velocities in his SMA stent that would be consistent with a greater than 70% stenosis.  His celiac velocities are also elevated today.  Currently not that symptomatic.  We will hold on any intervention at this time as he is not having any worrisome symptoms, but should he develop worrisome symptoms he should have an angiogram for further evaluation.  I would like to shorten his follow-up to 3 months as well with the worsening duplex findings.    [name], MD  08/21/2020 10:39 AM    This note was created with Dragon medical transcription system.  Any errors from dictation are purely unintentional

## 2020-08-21 NOTE — Assessment & Plan Note (Signed)
The last check I see was from 2019 with mild disease seen at that time.  With his recent falls, it is certainly reasonable to do another carotid duplex at his next follow-up visit.

## 2020-08-21 NOTE — Assessment & Plan Note (Signed)
His duplex today does show that he has had some increased velocities in his SMA stent that would be consistent with a greater than 70% stenosis.  His celiac velocities are also elevated today.  Currently not that symptomatic.  We will hold on any intervention at this time as he is not having any worrisome symptoms, but should he develop worrisome symptoms he should have an angiogram for further evaluation.  I would like to shorten his follow-up to 3 months as well with the worsening duplex findings.

## 2020-08-24 DIAGNOSIS — Z794 Long term (current) use of insulin: Secondary | ICD-10-CM | POA: Diagnosis not present

## 2020-08-24 DIAGNOSIS — E1159 Type 2 diabetes mellitus with other circulatory complications: Secondary | ICD-10-CM | POA: Diagnosis not present

## 2020-08-24 DIAGNOSIS — Z7185 Encounter for immunization safety counseling: Secondary | ICD-10-CM | POA: Diagnosis not present

## 2020-08-24 DIAGNOSIS — E113393 Type 2 diabetes mellitus with moderate nonproliferative diabetic retinopathy without macular edema, bilateral: Secondary | ICD-10-CM | POA: Diagnosis not present

## 2020-08-24 DIAGNOSIS — N183 Chronic kidney disease, stage 3 unspecified: Secondary | ICD-10-CM | POA: Diagnosis not present

## 2020-08-24 DIAGNOSIS — E1122 Type 2 diabetes mellitus with diabetic chronic kidney disease: Secondary | ICD-10-CM | POA: Diagnosis not present

## 2020-08-24 DIAGNOSIS — M8588 Other specified disorders of bone density and structure, other site: Secondary | ICD-10-CM | POA: Diagnosis not present

## 2020-08-24 DIAGNOSIS — M8580 Other specified disorders of bone density and structure, unspecified site: Secondary | ICD-10-CM | POA: Diagnosis not present

## 2020-08-24 DIAGNOSIS — E1142 Type 2 diabetes mellitus with diabetic polyneuropathy: Secondary | ICD-10-CM | POA: Diagnosis not present

## 2020-08-27 ENCOUNTER — Other Ambulatory Visit: Payer: Self-pay | Admitting: *Deleted

## 2020-08-27 DIAGNOSIS — N183 Chronic kidney disease, stage 3 unspecified: Secondary | ICD-10-CM | POA: Diagnosis not present

## 2020-08-27 DIAGNOSIS — I25118 Atherosclerotic heart disease of native coronary artery with other forms of angina pectoris: Secondary | ICD-10-CM | POA: Diagnosis not present

## 2020-08-27 DIAGNOSIS — E1159 Type 2 diabetes mellitus with other circulatory complications: Secondary | ICD-10-CM | POA: Diagnosis not present

## 2020-08-27 DIAGNOSIS — E1142 Type 2 diabetes mellitus with diabetic polyneuropathy: Secondary | ICD-10-CM | POA: Diagnosis not present

## 2020-08-27 DIAGNOSIS — F325 Major depressive disorder, single episode, in full remission: Secondary | ICD-10-CM | POA: Diagnosis not present

## 2020-08-27 DIAGNOSIS — E1122 Type 2 diabetes mellitus with diabetic chronic kidney disease: Secondary | ICD-10-CM | POA: Diagnosis not present

## 2020-08-27 DIAGNOSIS — Z794 Long term (current) use of insulin: Secondary | ICD-10-CM | POA: Diagnosis not present

## 2020-08-27 DIAGNOSIS — I779 Disorder of arteries and arterioles, unspecified: Secondary | ICD-10-CM | POA: Diagnosis not present

## 2020-08-27 DIAGNOSIS — E113393 Type 2 diabetes mellitus with moderate nonproliferative diabetic retinopathy without macular edema, bilateral: Secondary | ICD-10-CM | POA: Diagnosis not present

## 2020-08-27 NOTE — Patient Outreach (Signed)
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 Chronic mesenteric ischemia.FrequentFalls. peripheral neuropathy,retinopathy with intravitreal injections, Right5th metatarsal fracture, Iron deficiency anemia, 11/04/19 Aortogram and selective angiogram of celiac and superior mesentric artery. Angioplasty of celiac artery ED Visit : 03/21/20 at [facility] DX: Near Syncope, Chest pain, Hyperkalemia.   Outreach Attempt Unsuccessful outreach call to patient mobile and home numbers no answer able to leave a HIPAA compliant voice mail message for return call.     Plan If no return call from patient on today, will scheduled return call in the next 2 month for Disease Management plan  Joylene Draft, RN, BSN  [facility] Management Coordinator  [phone]- Mobile [phone]- [place]

## 2020-09-08 ENCOUNTER — Other Ambulatory Visit: Payer: Self-pay

## 2020-09-08 ENCOUNTER — Ambulatory Visit: Payer: Medicare HMO | Attending: Neurology | Admitting: Physical Therapy

## 2020-09-08 VITALS — BP 163/56 | HR 66

## 2020-09-08 DIAGNOSIS — Z9181 History of falling: Secondary | ICD-10-CM | POA: Insufficient documentation

## 2020-09-08 DIAGNOSIS — M6281 Muscle weakness (generalized): Secondary | ICD-10-CM | POA: Diagnosis not present

## 2020-09-08 DIAGNOSIS — R2681 Unsteadiness on feet: Secondary | ICD-10-CM | POA: Diagnosis not present

## 2020-09-08 NOTE — Patient Instructions (Signed)
Access Code: [email or web address] URL: [email or web address] Date: 09/08/2020 Prepared by: Zettie Pho  Exercises Heel rises with counter support - 1 x daily - 7 x weekly - 1 sets - 10 reps - 3 sec hold Side Stepping with Counter Support - 1 x daily - 7 x weekly - 1 sets - 5 reps Tandem Stance with Support - 1 x daily - 7 x weekly - 1 sets - 3 reps - 10 sec hold Standing Balance with Head Turn in Corner - 1 x daily - 7 x weekly - 1 sets - 5 reps

## 2020-09-09 NOTE — Therapy (Addendum)
[facility] MAIN [facility] SERVICES [address] [place], [place], [address] Phone: [phone]   Fax:  [phone]  Physical Therapy Evaluation  Patient Details  Name: Patrick Turner MRN: [id number] Date of Birth: 1945-12-26 Referring Provider (PT): Dr. Ernest Pine   Encounter Date: 09/08/2020   PT End of Session - 09/09/20 1535    Visit Number 1    Number of Visits 9    Date for PT Re-Evaluation 10/07/20    Authorization Type human[employer] M[employer], $40 copay    PT Start Time 1432    PT Stop Time 1525    PT Time Calculation (min) 53 min    Equipment Utilized During Treatment Gait belt    Activity Tolerance Patient tolerated treatment well    Behavior During Therapy [facility] for tasks assessed/performed           Past Medical History:  Diagnosis Date  . Altered mental status   . Anemia   . Anxiety   . Aortic atherosclerosis (HCC)   . Arthritis   . Coronary artery disease   . CRD (chronic renal disease)   . Depression   . Diabetes mellitus without complication (HCC)   . Encephalopathy acute   . GERD (gastroesophageal reflux disease)   . Headache   . Hypercholesteremia   . Hypertension   . Neuropathy   . Severe obesity (HCC)   . Sleep apnea   . Sleep terror    per patient this year per patient     Past Surgical History:  Procedure Laterality Date  . APPENDECTOMY    . CIRCUMCISION    . COLONOSCOPY    . COLONOSCOPY WITH PROPOFOL N/A 07/10/2018   Procedure: COLONOSCOPY WITH PROPOFOL;  Surgeon: Christena Deem, MD;  Location: [facility] ENDOSCOPY;  Service: Endoscopy;  Laterality: N/A;  . COLONOSCOPY WITH PROPOFOL N/A 04/23/2019   Procedure: COLONOSCOPY WITH PROPOFOL;  Surgeon: Christena Deem, MD;  Location: [facility] ENDOSCOPY;  Service: Endoscopy;  Laterality: N/A;  . CORONARY STENT INTERVENTION N/A 10/03/2019   Procedure: CORONARY STENT INTERVENTION;  Surgeon: Alwyn Pea, MD;  Location: [facility] INVASIVE CV LAB;  Service: Cardiovascular;   Laterality: N/A;  . ESOPHAGOGASTRODUODENOSCOPY    . ESOPHAGOGASTRODUODENOSCOPY (EGD) WITH PROPOFOL N/A 04/23/2019   Procedure: ESOPHAGOGASTRODUODENOSCOPY (EGD) WITH PROPOFOL;  Surgeon: Christena Deem, MD;  Location: [facility] ENDOSCOPY;  Service: Endoscopy;  Laterality: N/A;  . JOINT REPLACEMENT    . KNEE ARTHROPLASTY Right 06/27/2016   Procedure: COMPUTER ASSISTED TOTAL KNEE ARTHROPLASTY;  Surgeon: Donato Heinz, MD;  Location: [facility] ORS;  Service: Orthopedics;  Laterality: Right;  . KNEE ARTHROSCOPY Right   . LEFT HEART CATH AND CORONARY ANGIOGRAPHY N/A 10/03/2019   Procedure: Left Heart Cath and possible Coronary intervention;  Surgeon: Laurier Nancy, MD;  Location: [facility] INVASIVE CV LAB;  Service: Cardiovascular;  Laterality: N/A;  . TONSILLECTOMY    . VISCERAL ANGIOGRAPHY N/A 05/23/2019   Procedure: VISCERAL ANGIOGRAPHY;  Surgeon: Annice Needy, MD;  Location: [facility] INVASIVE CV LAB;  Service: Cardiovascular;  Laterality: N/A;  . VISCERAL ANGIOGRAPHY N/A 11/04/2019   Procedure: VISCERAL ANGIOGRAPHY;  Surgeon: Annice Needy, MD;  Location: [facility] INVASIVE CV LAB;  Service: Cardiovascular;  Laterality: N/A;    Vitals:   09/08/20 1448  BP: (!) 163/56  Pulse: 66  SpO2: 100%      Subjective Assessment - 09/08/20 1428    Subjective "Dr. Ernest Pine was concerned about getting me into therapy again to try to get me  stronger and reduce my fall risk."    Pertinent History 74 yo Male reports history of imbalance. He is s/p RLE TKA approximately 3 years ago. He also reports history of neuropathy in BLE (R>L) which does affect his balance. His recent EMG study shows Right partial femoral neuropathy and generalized length dependent peripheral neuropathy; He did outpatient PT in Sept-Oct 2021 and stopped due to uncontrolled BP and other medical issues.  He presents to therapy with SPC. He reports not using cane at work so that he has use of UE for work tasks. He will use SPC when at home especially with stair  negotiation; He reports multiple falls (falling just about every day), He reports falling about a month ago straight on his buttocks and reports increased bruising. He denies any fracture. He reports still having the buttocks pain especially with sitting. He is still working at [employer] and reports standing on them 6-7 hours a day. he reports increased pain when he sits down; He reports his BP has been better controlled. He does monitor his BP at home. He reports he is still having episodes of being extremely fatigued and "out of it" having a hard time doing activity at least 1x a day. Patient did receive outpatient PT earlier in the year for RLE weakness with no improvement; Therapy consisted of strengthening, ultrasound, with no improvement; He was given a HEP and reports he would do them sometimes; He is not doing them now. 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  His celiac velocities are also elevated today. He does have a RW at home but states he hasn't been using it.    Limitations Standing;Walking    How long can you sit comfortably? limited to short time due to buttocks pain, approximately 15-20 min;    How long can you stand comfortably? will stand 6-7 hours a work; starts to have fatigue/discomforts after 2-3 hours;    How long can you walk comfortably? about 500 feet but staggers often/unstable;    Diagnostic tests EMG study in July 2021 shows RLE partial femoral neuropathy and length dependent peripheral neuropathy;    Patient Stated Goals improve walking, be able to walk without stumbling/staggering to the side;    Currently in Pain? Yes     Pain Location Buttocks    Pain Orientation Lower    Pain Descriptors / Indicators Aching;Sore    Pain Type Chronic pain    Pain Onset More than a month ago    Pain Frequency Intermittent    Aggravating Factors  worse with sitting/transfers/getting out of bed    Pain Relieving Factors standing/walking    Effect of Pain on Daily Activities decreased activity tolerance;    Multiple Pain Sites No              [facility] PT Assessment - 09/09/20 0001      Assessment   Medical Diagnosis Imbalance/polyneuropathy s/p TKA    Referring Provider (PT) Dr. Marry Guan    Onset Date/Surgical Date 10/14/19    Hand Dominance Right    Next MD Visit none scheduled    Prior Therapy had PT in early 2021 for RLE strengthening with minimal  results; Did outpatient PT in Sept-Oct 2021 but was stopped due to uncontrolled BP and poor medical management      Precautions   Precautions Fall    Required Braces or Orthoses --   none     Restrictions   Weight Bearing Restrictions No      Balance Screen   Has the patient fallen in the past 6 months Yes    How many times? daily    Has the patient had a decrease in activity level because of a fear of falling?  Yes    Is the patient reluctant to leave their home because of a fear of falling?  No      Home Tourist information centre manager residence    Living Arrangements Spouse/significant other    Available Help at Discharge Family    Type of Home House    Home Access Stairs to enter    Entrance Stairs-Number of Steps 2    Entrance Stairs-Rails None    Home Layout Two level    Home Equipment [place] - single point;Walker - 2 wheels;Shower seat      Prior Function   Level of Independence Requires assistive device for independence   uses cane; mod I for self care ADLs;    Vocation Full time employment    Vocation Requirements 5-6 days a week; lots of standing;       Cognition   Overall Cognitive Status Within Functional Limits for tasks assessed       Observation/Other Assessments   Observations has started wearing compression hose and states that has made them feel better; It helps reduce swelling in feet; Wears them when at work    Focus on Therapeutic Outcomes (FOTO)  48.3%      Sensation   Light Touch Impaired by gross assessment    Additional Comments Numbness/tingling in BLE feet (entire foot) additional numbness on RLE lower leg along medial lower leg, along L4 dermatome       Coordination   Gross Motor Movements are Fluid and Coordinated Yes    Fine Motor Movements are Fluid and Coordinated Yes    Heel Shin Test accurate bilaterally but slow movement;       Posture/Postural Control   Posture Comments WFL      AROM   Overall AROM Comments WFL for UE/LE      Strength   Overall Strength Comments RLE hip grossly 4-/5, knee 4/5, ankle 4/5; LLE hip 4/5, knee 5/5, ankle 4/5      Transfers   Comments required 2 HHA to push up from chair;       Ambulation/Gait   Gait Comments ambulates with SPC with slower gait speed, slight veering side/side; exhibits slight antalgic gait pattern with short stance time on RLE and decreased step length with LLE. Requires CGA to close supervision for safety;      Standardized Balance Assessment   Standardized Balance Assessment 10 meter walk test;Five Times Sit to Stand    Five times sit to stand comments  41.4 sec without HHA, >15 sec indicates high fall risk    10 Meter Walk 0.44 m/s with SPC indicating high fall risk      High Level Balance   High Level Balance Comments static standing balance is fair, dynamic standing balance is fair;    unable to hold SLS; unable to maintain tandem stance  Objective measurements completed on examination: See above findings.               PT Education - 09/09/20 1534    Education Details plan of care/recommendations    Person(s) Educated Patient    Methods Explanation;Verbal cues    Comprehension Returned  demonstration;Verbal cues required;Verbalized understanding;Need further instruction            PT Short Term Goals - 09/09/20 1544      PT SHORT TERM GOAL #1   Title Patient will be adherent to HEP at least 3x a week to improve functional strength and balance for better safety at home.    Time 4    Period Weeks    Status New    Target Date 10/07/20      PT SHORT TERM GOAL #2   Title Patient will deny any falls over last 2 weeks to exhibit improvement in safety awareness and improved balance at home and at work.    Time 4    Period Weeks    Status New    Target Date 10/07/20             PT Long Term Goals - 09/09/20 1544      PT LONG TERM GOAL #1   Title Patient will increase Berg Balance score by > 6 points to demonstrate decreased fall risk during functional activities.    Baseline 12/28: deferred    Time 4    Period Weeks    Status New    Target Date 10/07/20      PT LONG TERM GOAL #2   Title Patient will increase 10 meter walk test to >0.8 m/s as to improve gait speed for better community ambulation and to reduce fall risk.    Baseline 12/28: 0.44 m/s    Time 4    Period Weeks    Status New    Target Date 10/07/20      PT LONG TERM GOAL #3   Title Patient will improve FOTO score to >60% to indicate improved functional mobility at home and at work.    Baseline 12/28: 48%    Time 4    Period Weeks    Status New    Target Date 10/07/20      PT LONG TERM GOAL #4   Title Patient will reduce 5 times sit<>Stand to <20 sec to indicate improved transfer ability and improved LE functional strength.    Baseline 12/28: 41 sec    Time 4    Period Weeks    Status New    Target Date 10/07/20                  Plan - 09/09/20 1535    Clinical Impression Statement Patient presents to therapy with continued imbalance, reporting falling almost daily. He is currently ambulating with a SPC. Patient does have peripheral neuropathy as a result of past TKA with  numbness along RLE medial lower leg and into foot. He also exhibits weakness in BLE and reports his legs will buckle often. He tested as a high fall risk and exhibits limited gait ability per 10 meter walk test.  He is working at [employer] approximately 50 hours a week. He has been instructed by his doctors to use a RW but despite recommendation prefers SPC. Patient was receiving outpatient PT from September-Oct 2021. Therapy was stopped as patient had multiple medical issues including uncontrolled BP and was excessivly fatigued and reported  increased buttock pain which made therapy intolerable. He is hoping to return to therapy to work on balance and LE strength. His BP was improved today but is still running on the higher side. Patient also continues to have buttocks pain and reports increased fatigue from high demand work schedule. He would benefit from skilled PT Intervention as a trial working on strength and balance for better mobility.    Personal Factors and Comorbidities Age;Comorbidity 3+;Past/Current Experience;Time since onset of injury/illness/exacerbation    Comorbidities Depression, HTN, DMx2, hypercholesterolemia, GERD, neuropathy, multiple falls,    Examination-Activity Limitations Squat;Stairs;Locomotion Level;Stand;Transfers    Examination-Participation Restrictions Cleaning;Community Activity;Meal Prep;Occupation;Shop;Volunteer;Yard Work    Merchant navy officer Unstable/Unpredictable    Surveyor, mining    Rehab Potential Fair    PT Frequency 2x / week    PT Duration 4 weeks    PT Treatment/Interventions Cryotherapy;Electrical Stimulation;Moist Heat;Gait training;Stair training;Functional mobility training;Therapeutic activities;Therapeutic exercise;Balance training;Neuromuscular re-education;Patient/family education;Orthotic Fit/Training;Energy conservation    PT Next Visit Plan complete Berg/initiate HEP    PT Home Exercise Plan will address next session;     Consulted and Agree with Plan of Care Patient           Patient will benefit from skilled therapeutic intervention in order to improve the following deficits and impairments:  Abnormal gait,Decreased balance,Decreased endurance,Decreased mobility,Difficulty walking,Decreased activity tolerance,Decreased strength  Visit Diagnosis: Unsteadiness on feet  History of falling  Muscle weakness (generalized)     Problem List Patient Active Problem List   Diagnosis Date Noted  . Anemia in chronic kidney disease 05/28/2020  . Benign hypertensive kidney disease with chronic kidney disease 05/28/2020  . Proteinuria 05/28/2020  . Falls frequently 04/27/2020  . Use of cane as ambulatory aid 04/27/2020  . Numbness and tingling of both legs 02/18/2020  . Primary osteoarthritis of left knee 01/04/2020  . Diabetic retinopathy ([place]) 01/01/2020  . ASCVD (arteriosclerotic cardiovascular disease) 10/16/2019  . Hospitalization within last 30 days 10/16/2019  . Hyperkalemia 10/14/2019  . Pseudoaneurysm of right femoral artery ([place]) 10/14/2019  . AKI (acute kidney injury) ([place]) 10/13/2019  . Unstable angina ([place]) 10/02/2019  . Chest pain 10/01/2019  . Uncontrolled type 2 diabetes mellitus with hyperglycemia, with long-term current use of insulin ([place]) 09/27/2019  . Chronic mesenteric ischemia ([place]) 05/06/2019  . Abdominal pain 05/03/2019  . Acute on chronic renal failure ([place]) 03/23/2019  . AMS (altered mental status) 12/24/2018  . Dizziness 06/30/2018  . HLD (hyperlipidemia) 02/23/2018  . Aortic atherosclerosis ([place]) 11/02/2017  . Healthcare maintenance 12/26/2016  . DM type 2 with diabetic peripheral neuropathy ([place]) 11/09/2016  . Presence of right artificial knee joint 08/14/2016  . Bilateral carotid artery disease ([place]) 07/12/2016  . 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 Acute diarrhea 05/30/2016  . Encephalopathy acute 05/30/2016  . Anemia 05/30/2016  . Acute on chronic renal  insufficiency 05/30/2016  . Diarrhea 05/30/2016  . Stage 3b chronic kidney disease ([place]) 03/30/2015  . Depression, major, in remission ([place]) 08/02/2014  . Severe obesity (BMI 35.0-39.9) with comorbidity ([place]) 07/12/2014  . GERD (gastroesophageal reflux disease) 03/08/2014  . Hyperlipidemia associated with type 2 diabetes mellitus ([place]) 03/08/2014  . 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 Diabetes mellitus with stage 3 chronic kidney disease ([place]) 01/21/2014  . HTN (hypertension), benign 01/21/2014  . OSA (obstructive sleep apnea) 01/21/2014  . Unspecified transient cerebral ischemia 05/29/2012    Dynesha Woolen PT, DPT 09/09/2020, 3:46 PM  [facility] MAIN REHAB SERVICES [place]  [place], [place], [address] Phone: [phone]   Fax:  [phone]  Name: SAVONE WEHRI MRN: [id number] Date of Birth: 07-10-1946

## 2020-09-10 ENCOUNTER — Ambulatory Visit: Payer: Medicare HMO | Admitting: Physical Therapy

## 2020-09-10 ENCOUNTER — Other Ambulatory Visit: Payer: Self-pay

## 2020-09-10 ENCOUNTER — Encounter: Payer: Self-pay | Admitting: Physical Therapy

## 2020-09-10 VITALS — BP 160/53 | HR 66

## 2020-09-10 DIAGNOSIS — R2681 Unsteadiness on feet: Secondary | ICD-10-CM | POA: Diagnosis not present

## 2020-09-10 DIAGNOSIS — M6281 Muscle weakness (generalized): Secondary | ICD-10-CM | POA: Diagnosis not present

## 2020-09-10 DIAGNOSIS — Z9181 History of falling: Secondary | ICD-10-CM

## 2020-09-10 NOTE — Therapy (Signed)
[place] MAIN [facility] SERVICES [address] [place], [place], [address] Phone: [phone]   Fax:  [phone]  Physical Therapy Treatment  Patient Details  Name: Patrick Turner MRN: [id number] Date of Birth: 06-Jun-1946 Referring Provider (PT): Dr. Marry Guan   Encounter Date: 09/10/2020   PT End of Session - 09/10/20 1102    Visit Number 2    Number of Visits 9    Date for PT Re-Evaluation 10/07/20    Authorization Type [employer], $40 copay    PT Start Time 1016    PT Stop Time 1100    PT Time Calculation (min) 44 min    Equipment Utilized During Treatment Gait belt    Activity Tolerance Patient tolerated treatment well    Behavior During Therapy [facility] for tasks assessed/performed           Past Medical History:  Diagnosis Date  . Altered mental status   . Anemia   . Anxiety   . Aortic atherosclerosis ([place])   . Arthritis   . Coronary artery disease   . CRD (chronic renal disease)   . Depression   . Diabetes mellitus without complication ([place])   . Encephalopathy acute   . GERD (gastroesophageal reflux disease)   . Headache   . Hypercholesteremia   . Hypertension   . Neuropathy   . Severe obesity ([place])   . Sleep apnea   . Sleep terror    per patient this year per patient     Past Surgical History:  Procedure Laterality Date  . APPENDECTOMY    . CIRCUMCISION    . COLONOSCOPY    . COLONOSCOPY WITH PROPOFOL N/A 07/10/2018   Procedure: COLONOSCOPY WITH PROPOFOL;  Surgeon: Lollie Sails, MD;  Location: [facility] ENDOSCOPY;  Service: Endoscopy;  Laterality: N/A;  . COLONOSCOPY WITH PROPOFOL N/A 04/23/2019   Procedure: COLONOSCOPY WITH PROPOFOL;  Surgeon: Lollie Sails, MD;  Location: [facility] ENDOSCOPY;  Service: Endoscopy;  Laterality: N/A;  . CORONARY STENT INTERVENTION N/A 10/03/2019   Procedure: CORONARY STENT INTERVENTION;  Surgeon: Yolonda Kida, MD;  Location: [place] CV LAB;  Service: Cardiovascular;   Laterality: N/A;  . ESOPHAGOGASTRODUODENOSCOPY    . ESOPHAGOGASTRODUODENOSCOPY (EGD) WITH PROPOFOL N/A 04/23/2019   Procedure: ESOPHAGOGASTRODUODENOSCOPY (EGD) WITH PROPOFOL;  Surgeon: Lollie Sails, MD;  Location: [facility] ENDOSCOPY;  Service: Endoscopy;  Laterality: N/A;  . JOINT REPLACEMENT    . 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 KNEE ARTHROSCOPY Right   . LEFT HEART CATH AND CORONARY ANGIOGRAPHY N/A 10/03/2019   Procedure: Left Heart Cath and possible Coronary intervention;  Surgeon: Dionisio David, MD;  Location: [place] CV LAB;  Service: Cardiovascular;  Laterality: N/A;  . TONSILLECTOMY    . VISCERAL ANGIOGRAPHY N/A 05/23/2019   Procedure: VISCERAL ANGIOGRAPHY;  Surgeon: Algernon Huxley, MD;  Location: [place] CV LAB;  Service: Cardiovascular;  Laterality: N/A;  . VISCERAL ANGIOGRAPHY N/A 11/04/2019   Procedure: VISCERAL ANGIOGRAPHY;  Surgeon: Algernon Huxley, MD;  Location: [place] CV LAB;  Service: Cardiovascular;  Laterality: N/A;    Vitals:   09/10/20 1023  BP: (!) 160/53  Pulse: 66  SpO2: 100%     Subjective Assessment - 09/10/20 1023    Subjective Patient presents to therapy with SPC. He reports he hasn't been using his walker because its  not practical with where he works and where he spends most of his time. Denies any new falls. He does report continued soreness in buttocks;    Pertinent History 74 yo Male reports history of imbalance. He is s/p RLE TKA approximately 3 years ago. He also reports history of neuropathy in BLE (R>L) which does affect his balance. His recent EMG study shows Right partial femoral neuropathy and generalized length dependent peripheral neuropathy; He did outpatient PT in Sept-Oct 2021 and stopped due to uncontrolled BP and other medical issues.  He presents to therapy with SPC. He reports  not using cane at work so that he has use of UE for work tasks. He will use SPC when at home especially with stair negotiation; He reports multiple falls (falling just about every day), He reports falling about a month ago straight on his buttocks and reports increased bruising. He denies any fracture. He reports still having the buttocks pain especially with sitting. He is still working at [employer] and reports standing on them 6-7 hours a day. he reports increased pain when he sits down; He reports his BP has been better controlled. He does monitor his BP at home. He reports he is still having episodes of being extremely fatigued and "out of it" having a hard time doing activity at least 1x a day. Patient did receive outpatient PT earlier in the year for RLE weakness with no improvement; Therapy consisted of strengthening, ultrasound, with no improvement; He was given a HEP and reports he would do them sometimes; He is not doing them now. 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  His celiac velocities are also elevated today. He does have a RW at home but states he hasn't been using it.    Limitations Standing;Walking    How long can you sit comfortably? limited to short time due to buttocks pain, approximately 15-20 min;    How long can you stand comfortably? will stand 6-7 hours a work; starts to have fatigue/discomforts after 2-3 hours;    How long can you walk comfortably? about 500 feet but staggers often/unstable;    Diagnostic tests EMG study in July 2021 shows RLE partial femoral neuropathy and length dependent peripheral neuropathy;    Patient  Stated Goals improve walking, be able to walk without stumbling/staggering to the side;    Currently in Pain? Yes    Pain Score 3     Pain Location Buttocks    Pain Orientation Lower    Pain Descriptors / Indicators Aching;Sore    Pain Type Chronic pain    Pain Onset More than a month ago    Pain Frequency Intermittent    Aggravating Factors  worse with sitting    Pain Relieving Factors standing/walking    Effect of Pain on Daily Activities decreased activity tolerance;    Multiple Pain Sites No              TREATMENT: Reinforced/Reinstructed patient in HEP:  Standing outside parallel bars: -BLE heel raises 3 sec hold x10 reps with BUE rail assist; -Side stepping with intermittent HHA x10 feet x3 laps each direction -Tandem stance with 1-0 rail assist 10  sec hold x2 reps each foot in front -Feet apart, unsupported head turns side/side x5 reps Patient provided with written HEP; These exercises chosen to improve LE strength and balance that he can work on at home or at work;   LE strengthening: Veterinary surgeon with ankle DF 2.5# x20 reps each LE with min VCs to do slowly for better motor control and strengthening;   Standing:  -Hip flexion march 2.5# x15 reps each LE -Hip abduction 2.5# x15 reps each LE Required min VCs for proper positioning and to increase ROM for better tolerance;    Leg press: BLE 40# 2x10, required min A to get on/off machine; Patient also required min VCs to slow down LE movement for better motor control;   Standing in parallel bars: Forward/backward step over orange hurdle x10 reps with 1 rail assist with min Vcs to increase step length and increase heel strike for better foot clearance;  Side stepping over orange hurdle x10 reps with 1 rail assist with cues for increased step length for better foot clearance;  Patient tolerated session fair. He does require increased time with LE exercise and reports increased fatigue. He denies any increase in pain;  Reinforced importance of using RW especially when walking long distances or when outside going to/from work;                     PT Short Term Goals - 09/09/20 1544      PT SHORT TERM GOAL #1   Title Patient will be adherent to HEP at least 3x a week to improve functional strength and balance for better safety at home.    Time 4    Period Weeks    Status New    Target Date 10/07/20      PT SHORT TERM GOAL #2   Title Patient will deny any falls over last 2 weeks to exhibit improvement in safety awareness and improved balance at home and at work.    Time 4    Period Weeks    Status New    Target Date 10/07/20             PT Long Term Goals - 09/09/20 1544      PT LONG TERM GOAL #1   Title Patient will increase Berg Balance score by > 6 points to demonstrate decreased fall risk during functional activities.    Baseline 12/28: deferred    Time 4    Period Weeks    Status New    Target Date 10/07/20      PT LONG TERM GOAL #2   Title Patient will increase 10 meter walk test to >0.8 m/s as to improve gait speed for better community ambulation and to reduce fall risk.    Baseline 12/28: 0.44 m/s    Time 4    Period Weeks    Status New    Target Date 10/07/20      PT LONG TERM GOAL #3   Title Patient will improve FOTO score to >60% to indicate improved functional mobility at home and at work.    Baseline 12/28: 48%    Time 4    Period Weeks    Status New    Target Date 10/07/20      PT LONG TERM GOAL #4   Title Patient will reduce 5 times sit<>Stand to <20 sec to indicate improved transfer ability and improved LE functional strength.    Baseline 12/28: 41 sec  Time 4    Period Weeks    Status New    Target Date 10/07/20                 Plan - 09/10/20 1047    Clinical Impression Statement Patient motivated and participated well within session. He was instructed in advanced LE strengthening exercise, requiring min VCs for proper positioning  and exercise technique for optimal muscle activation. Patient also educated in HEP for LE strength/balance. Patient instructed in exercise he can do at work when available to improve adherence. He does report increased fatigue at end of session. Reinforced importance of using RW when going to/from work for better safety with gait. He would benefit from additional skilled PT intervention to improve strength, balance and gait safety.    Personal Factors and Comorbidities Age;Comorbidity 3+;Past/Current Experience;Time since onset of injury/illness/exacerbation    Comorbidities Depression, HTN, DMx2, hypercholesterolemia, GERD, neuropathy    Examination-Activity Limitations Squat;Stairs;Locomotion Level;Stand;Transfers    Examination-Participation Restrictions Cleaning;Community Activity;Meal Prep;Occupation;Shop;Volunteer;Yard Work    Merchant navy officer Evolving/Moderate complexity    Rehab Potential Fair    PT Frequency 2x / week    PT Duration 4 weeks    PT Treatment/Interventions Cryotherapy;Electrical Stimulation;Moist Heat;Gait training;Stair training;Functional mobility training;Therapeutic activities;Therapeutic exercise;Balance training;Neuromuscular re-education;Patient/family education;Orthotic Fit/Training;Energy conservation    PT Next Visit Plan complete Berg/initiate HEP    PT Home Exercise Plan will address next session;    Consulted and Agree with Plan of Care Patient           Patient will benefit from skilled therapeutic intervention in order to improve the following deficits and impairments:  Abnormal gait,Decreased balance,Decreased endurance,Decreased mobility,Difficulty walking,Decreased activity tolerance,Decreased strength  Visit Diagnosis: Unsteadiness on feet  History of falling  Muscle weakness (generalized)     Problem List Patient Active Problem List   Diagnosis Date Noted  . Anemia in chronic kidney disease 05/28/2020  . Benign  hypertensive kidney disease with chronic kidney disease 05/28/2020  . Proteinuria 05/28/2020  . Falls frequently 04/27/2020  . Use of cane as ambulatory aid 04/27/2020  . Numbness and tingling of both legs 02/18/2020  . Primary osteoarthritis of left knee 01/04/2020  . Diabetic retinopathy ([place]) 01/01/2020  . ASCVD (arteriosclerotic cardiovascular disease) 10/16/2019  . Hospitalization within last 30 days 10/16/2019  . Hyperkalemia 10/14/2019  . Pseudoaneurysm of right femoral artery ([place]) 10/14/2019  . AKI (acute kidney injury) ([place]) 10/13/2019  . Unstable angina ([place]) 10/02/2019  . Chest pain 10/01/2019  . Uncontrolled type 2 diabetes mellitus with hyperglycemia, with long-term current use of insulin ([place]) 09/27/2019  . Chronic mesenteric ischemia ([place]) 05/06/2019  . Abdominal pain 05/03/2019  . Acute on chronic renal failure ([place]) 03/23/2019  . AMS (altered mental status) 12/24/2018  . Dizziness 06/30/2018  . HLD (hyperlipidemia) 02/23/2018  . Aortic atherosclerosis ([place]) 11/02/2017  . Healthcare maintenance 12/26/2016  . DM type 2 with diabetic peripheral neuropathy ([place]) 11/09/2016  . Presence of right artificial knee joint 08/14/2016  . Bilateral carotid artery disease ([place]) 07/12/2016  . 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 Acute diarrhea 05/30/2016  . Encephalopathy acute 05/30/2016  . Anemia 05/30/2016  . Acute on chronic renal insufficiency 05/30/2016  . Diarrhea 05/30/2016  . Stage 3b chronic kidney disease ([place]) 03/30/2015  . Depression, major, in remission ([place]) 08/02/2014  . Severe obesity (BMI 35.0-39.9) with comorbidity ([place]) 07/12/2014  . GERD (gastroesophageal reflux disease) 03/08/2014  . Hyperlipidemia associated with type 2 diabetes mellitus ([place]) 03/08/2014  . Drug-induced  parkinsonism ([place]) 01/24/2014  . Diabetes mellitus with stage 3 chronic kidney disease ([place]) 01/21/2014  . HTN (hypertension), benign 01/21/2014  . OSA (obstructive sleep apnea)  01/21/2014  . Unspecified transient cerebral ischemia 05/29/2012    Daevon Holdren PT, DPT 09/10/2020, 11:02 AM  [place] MAIN [facility] SERVICES [address] [place], [place], [address] Phone: [phone]   Fax:  [phone]  Name: FISHEL BIRKEY MRN: [id number] Date of Birth: [birth year]

## 2020-09-17 ENCOUNTER — Ambulatory Visit: Payer: Medicare HMO | Attending: Neurology

## 2020-09-17 ENCOUNTER — Other Ambulatory Visit: Payer: Self-pay

## 2020-09-17 DIAGNOSIS — M6281 Muscle weakness (generalized): Secondary | ICD-10-CM | POA: Diagnosis not present

## 2020-09-17 DIAGNOSIS — R2681 Unsteadiness on feet: Secondary | ICD-10-CM | POA: Diagnosis not present

## 2020-09-17 DIAGNOSIS — Z9181 History of falling: Secondary | ICD-10-CM | POA: Diagnosis not present

## 2020-09-17 NOTE — Therapy (Signed)
[place] MAIN [facility] SERVICES [address] [place], [place], [address] Phone: [phone]   Fax:  [phone]  Physical Therapy Treatment  Patient Details  Name: Patrick Turner MRN: [id number] Date of Birth: Jan 21, 1946 Referring Provider (PT): Dr. Marry Guan   Encounter Date: 09/17/2020   PT End of Session - 09/17/20 1025    Visit Number 3    Number of Visits 9    Date for PT Re-Evaluation 10/07/20    Authorization Type [employer], $40 copay    Authorization Time Period Cert written for [id number]; award letter says 09/10/20-10/11/20    PT Start Time 1018    PT Stop Time 1058    PT Time Calculation (min) 40 min    Activity Tolerance Patient tolerated treatment well;No increased pain;Patient limited by fatigue    Behavior During Therapy [facility] for tasks assessed/performed           Past Medical History:  Diagnosis Date  . Altered mental status   . Anemia   . Anxiety   . Aortic atherosclerosis ([place])   . Arthritis   . Coronary artery disease   . CRD (chronic renal disease)   . Depression   . Diabetes mellitus without complication ([place])   . Encephalopathy acute   . GERD (gastroesophageal reflux disease)   . Headache   . Hypercholesteremia   . Hypertension   . Neuropathy   . Severe obesity ([place])   . Sleep apnea   . Sleep terror    per patient this year per patient     Past Surgical History:  Procedure Laterality Date  . APPENDECTOMY    . CIRCUMCISION    . COLONOSCOPY    . COLONOSCOPY WITH PROPOFOL N/A 07/10/2018   Procedure: COLONOSCOPY WITH PROPOFOL;  Surgeon: Lollie Sails, MD;  Location: [facility] ENDOSCOPY;  Service: Endoscopy;  Laterality: N/A;  . COLONOSCOPY WITH PROPOFOL N/A 04/23/2019   Procedure: COLONOSCOPY WITH PROPOFOL;  Surgeon: Lollie Sails, MD;  Location: [facility] ENDOSCOPY;  Service: Endoscopy;  Laterality: N/A;  . CORONARY STENT INTERVENTION N/A 10/03/2019   Procedure: CORONARY STENT INTERVENTION;  Surgeon:  Yolonda Kida, MD;  Location: [place] CV LAB;  Service: Cardiovascular;  Laterality: N/A;  . ESOPHAGOGASTRODUODENOSCOPY    . ESOPHAGOGASTRODUODENOSCOPY (EGD) WITH PROPOFOL N/A 04/23/2019   Procedure: ESOPHAGOGASTRODUODENOSCOPY (EGD) WITH PROPOFOL;  Surgeon: Lollie Sails, MD;  Location: [facility] ENDOSCOPY;  Service: Endoscopy;  Laterality: N/A;  . JOINT REPLACEMENT    . 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 KNEE ARTHROSCOPY Right   . LEFT HEART CATH AND CORONARY ANGIOGRAPHY N/A 10/03/2019   Procedure: Left Heart Cath and possible Coronary intervention;  Surgeon: Dionisio David, MD;  Location: [place] CV LAB;  Service: Cardiovascular;  Laterality: N/A;  . TONSILLECTOMY    . VISCERAL ANGIOGRAPHY N/A 05/23/2019   Procedure: VISCERAL ANGIOGRAPHY;  Surgeon: Algernon Huxley, MD;  Location: [place] CV LAB;  Service: Cardiovascular;  Laterality: N/A;  . VISCERAL ANGIOGRAPHY N/A 11/04/2019   Procedure: VISCERAL ANGIOGRAPHY;  Surgeon: Algernon Huxley, MD;  Location: [place] CV LAB;  Service: Cardiovascular;  Laterality: N/A;    There were no vitals filed for this visit.   Subjective Assessment - 09/17/20 1021    Subjective Pt doing ok today he has some soreness. Pt reports falling earlier this morning, tripped  at home, fell onto a fan, new skin injury of left volar forearm (with bandaids currently). Reports HEP perfomance, fairly easy, nothing unclear.    Pertinent History 75 yo Male reports history of imbalance. He is s/p RLE TKA approximately 3 years ago. He also reports history of neuropathy in BLE (R>L) which does affect his balance. His recent EMG study shows Right partial femoral neuropathy and generalized length dependent peripheral neuropathy; He did outpatient PT in Sept-Oct 2021 and stopped due to uncontrolled BP and other  medical issues.  He presents to therapy with SPC. He reports not using cane at work so that he has use of UE for work tasks. He will use SPC when at home especially with stair negotiation; He reports multiple falls (falling just about every day), He reports falling about a month ago straight on his buttocks and reports increased bruising. He denies any fracture. He reports still having the buttocks pain especially with sitting. He is still working at [employer] and reports standing on them 6-7 hours a day. he reports increased pain when he sits down; He reports his BP has been better controlled. He does monitor his BP at home. He reports he is still having episodes of being extremely fatigued and "out of it" having a hard time doing activity at least 1x a day. Patient did receive outpatient PT earlier in the year for RLE weakness with no improvement; Therapy consisted of strengthening, ultrasound, with no improvement; He was given a HEP and reports he would do them sometimes; He is not doing them now. 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  His celiac velocities are also elevated today. He does have a RW at home but states he hasn't been using it.    Limitations Standing;Walking    Currently in Pain? No/denies   no pain, just some soreness             [facility] PT Assessment - 09/17/20 0001      Berg Balance Test   Sit to Stand Able to stand without using hands and stabilize independently    Standing Unsupported Able to stand 2 minutes with supervision    Sitting with Back Unsupported but Feet Supported on Floor or Stool Able to sit safely and  securely 2 minutes    Stand to Sit Sits safely with minimal use of hands    Transfers Able to transfer safely, minor use of hands    Standing Unsupported with Eyes Closed Able to stand 10 seconds with supervision    Standing Unsupported with Feet Together Able to place feet together independently and stand for 1 minute with supervision    From Standing, Reach Forward with Outstretched Arm Can reach forward >5 cm safely (2")    From Standing Position, Pick up Object from Floor Unable to pick up and needs supervision   unable to return to position without mod-maxA   From Standing Position, Turn to Look Behind Over each Shoulder Turn sideways only but maintains balance    Turn 360 Degrees Needs close supervision or verbal cueing    Standing Unsupported, Alternately Place Feet on Step/Stool Able to complete >2 steps/needs minimal assist  Standing Unsupported, One Foot in [employer] balance while stepping or standing    Standing on One Leg Unable to try or needs assist to prevent fall    Total Score 32           INTERVENTION THIS DATE: -Standing double heel raise (BUE supported) 2x15 -Hurdle Fwd/Backward step overs 1x10 bilat (BUE supported) cues for heel strike  *BBT  -Seated LAQ 2x10 @ 4lb AW -STS from elevated plinth, 2x10 hands on chairback to promote trunk flexion prior to rise  -Seated marching 1x15 bilat      PT Short Term Goals - 09/17/20 1049      PT SHORT TERM GOAL #1   Title Patient will be adherent to HEP at least 3x a week to improve functional strength and balance for better safety at home.    Time 4    Period Weeks    Status On-going    Target Date 10/07/20      PT SHORT TERM GOAL #2   Title Patient will deny any falls over last 2 weeks to exhibit improvement in safety awareness and improved balance at home and at work.    Baseline Patient falls    Time 4    Period Weeks    Status On-going    Target Date 10/07/20             PT Long Term Goals -  09/17/20 1050      PT LONG TERM GOAL #1   Title Patient will increase Berg Balance score by > 6 points to demonstrate decreased fall risk during functional activities.    Baseline tested on 09/17/20: 36/56    Time 4    Period Weeks    Status On-going      PT LONG TERM GOAL #2   Title Patient will increase 10 meter walk test to >0.8 m/s as to improve gait speed for better community ambulation and to reduce fall risk.    Baseline 12/28: 0.44 m/s    Time 4    Period Weeks    Status On-going      PT LONG TERM GOAL #3   Title Patient will improve FOTO score to >60% to indicate improved functional mobility at home and at work.    Baseline 12/28: 48%    Time 4    Period Weeks    Status On-going    Target Date 10/07/20      PT LONG TERM GOAL #4   Title Patient will reduce 5 times sit<>Stand to <20 sec to indicate improved transfer ability and improved LE functional strength.    Baseline 12/28: 41 sec    Time 4    Period Weeks    Status On-going    Target Date 10/07/20                 Plan - 09/17/20 1028    Clinical Impression Statement Continued with current plan of care as laid out in evaluation and recent prior sessions. Berg Balance Test assessment thrown in the mix today, scoring 32/56. Pt remains motivated to advance progress toward goals. Rest breaks provided as needed. Moves remain largely bradykinetic (mildly). Author maintains all interventions within appropriate level of intensity as not to purposefully exacerbate pain. Pt does require varying levels of assistance and cuing for completion of exercises for correct form and sometimes due to pain/weakness, particularly to complete BBT. Pt continues to demonstrate progress toward goals AEB progression of some interventions this date  either in volume or intensity. No updates to HEP this date.    Personal Factors and Comorbidities Age;Comorbidity 3+;Past/Current Experience;Time since onset of injury/illness/exacerbation     Comorbidities Depression, HTN, DMx2, hypercholesterolemia, GERD, neuropathy    Examination-Activity Limitations Squat;Stairs;Locomotion Level;Stand;Transfers    Examination-Participation Restrictions Cleaning;Community Activity;Meal Prep;Occupation;Shop;Volunteer;Yard Work    Engineer, water    Rehab Potential Fair    PT Frequency 2x / week    PT Duration 4 weeks    PT Treatment/Interventions Cryotherapy;Electrical Stimulation;Moist Heat;Gait training;Stair training;Functional mobility training;Therapeutic activities;Therapeutic exercise;Balance training;Neuromuscular re-education;Patient/family education;Orthotic Fit/Training;Energy conservation    PT Next Visit Plan Conitnue with balance training, general strengthing to improve righting strategies    PT Home Exercise Plan thus far compliance is good, no updates since evaluation    Consulted and Agree with Plan of Care Patient           Patient will benefit from skilled therapeutic intervention in order to improve the following deficits and impairments:  Abnormal gait,Decreased balance,Decreased endurance,Decreased mobility,Difficulty walking,Decreased activity tolerance,Decreased strength  Visit Diagnosis: Unsteadiness on feet  History of falling  Muscle weakness (generalized)     Problem List Patient Active Problem List   Diagnosis Date Noted  . Anemia in chronic kidney disease 05/28/2020  . Benign hypertensive kidney disease with chronic kidney disease 05/28/2020  . Proteinuria 05/28/2020  . Falls frequently 04/27/2020  . Use of cane as ambulatory aid 04/27/2020  . Numbness and tingling of both legs 02/18/2020  . Primary osteoarthritis of left knee 01/04/2020  . Diabetic retinopathy ([place]) 01/01/2020  . ASCVD (arteriosclerotic cardiovascular disease) 10/16/2019  . Hospitalization within last 30 days 10/16/2019  . Hyperkalemia 10/14/2019  .  Pseudoaneurysm of right femoral artery ([place]) 10/14/2019  . AKI (acute kidney injury) ([place]) 10/13/2019  . Unstable angina ([place]) 10/02/2019  . Chest pain 10/01/2019  . Uncontrolled type 2 diabetes mellitus with hyperglycemia, with long-term current use of insulin ([place]) 09/27/2019  . Chronic mesenteric ischemia ([place]) 05/06/2019  . Abdominal pain 05/03/2019  . Acute on chronic renal failure ([place]) 03/23/2019  . AMS (altered mental status) 12/24/2018  . Dizziness 06/30/2018  . HLD (hyperlipidemia) 02/23/2018  . Aortic atherosclerosis ([place]) 11/02/2017  . Healthcare maintenance 12/26/2016  . DM type 2 with diabetic peripheral neuropathy ([place]) 11/09/2016  . Presence of right artificial knee joint 08/14/2016  . Bilateral carotid artery disease ([place]) 07/12/2016  . 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 Acute diarrhea 05/30/2016  . Encephalopathy acute 05/30/2016  . Anemia 05/30/2016  . Acute on chronic renal insufficiency 05/30/2016  . Diarrhea 05/30/2016  . Stage 3b chronic kidney disease ([place]) 03/30/2015  . Depression, major, in remission ([place]) 08/02/2014  . Severe obesity (BMI 35.0-39.9) with comorbidity ([place]) 07/12/2014  . GERD (gastroesophageal reflux disease) 03/08/2014  . Hyperlipidemia associated with type 2 diabetes mellitus ([place]) 03/08/2014  . Drug-induced parkinsonism ([place]) 01/24/2014  . Diabetes mellitus with stage 3 chronic kidney disease ([place]) 01/21/2014  . HTN (hypertension), benign 01/21/2014  . OSA (obstructive sleep apnea) 01/21/2014  . Unspecified transient cerebral ischemia 05/29/2012   11:00 AM, 09/17/20 Etta Grandchild, PT, DPT Physical Therapist - [place] [phone]     Etta Grandchild 09/17/2020, 10:54 AM  [place] MAIN [facility] SERVICES [address] [place], [place], [address] Phone: [phone]   Fax:  [phone]  Name: TRELON BLISH MRN: [id number]  Date of Birth: 09-Sep-1946

## 2020-09-22 ENCOUNTER — Other Ambulatory Visit: Payer: Self-pay

## 2020-09-22 ENCOUNTER — Ambulatory Visit: Payer: Medicare HMO | Admitting: Physical Therapy

## 2020-09-22 ENCOUNTER — Encounter: Payer: Self-pay | Admitting: Physical Therapy

## 2020-09-22 VITALS — BP 172/52 | HR 59

## 2020-09-22 DIAGNOSIS — Z9181 History of falling: Secondary | ICD-10-CM | POA: Diagnosis not present

## 2020-09-22 DIAGNOSIS — R2681 Unsteadiness on feet: Secondary | ICD-10-CM | POA: Diagnosis not present

## 2020-09-22 DIAGNOSIS — M6281 Muscle weakness (generalized): Secondary | ICD-10-CM | POA: Diagnosis not present

## 2020-09-22 NOTE — Therapy (Signed)
[place] MAIN [facility] SERVICES [address] [place], [place], [address] Phone: [phone]   Fax:  [phone]  Physical Therapy Treatment  Patient Details  Name: NORMAL RECINOS MRN: [id number] Date of Birth: Apr 02, 1946 Referring Provider (PT): Dr. Marry Guan   Encounter Date: 09/22/2020   PT End of Session - 09/22/20 0905    Visit Number 4    Number of Visits 9    Date for PT Re-Evaluation 10/07/20    Authorization Type [employer], $40 copay    Authorization Time Period Cert written for 67/89-3/81/01; award letter says 09/10/20-10/11/20    PT Start Time 0850    PT Stop Time 0930    PT Time Calculation (min) 40 min    Equipment Utilized During Treatment Gait belt    Activity Tolerance Patient tolerated treatment well;No increased pain;Patient limited by fatigue    Behavior During Therapy [facility] for tasks assessed/performed           Past Medical History:  Diagnosis Date  . Altered mental status   . Anemia   . Anxiety   . Aortic atherosclerosis ([place])   . Arthritis   . Coronary artery disease   . CRD (chronic renal disease)   . Depression   . Diabetes mellitus without complication ([place])   . Encephalopathy acute   . GERD (gastroesophageal reflux disease)   . Headache   . Hypercholesteremia   . Hypertension   . Neuropathy   . Severe obesity ([place])   . Sleep apnea   . Sleep terror    per patient this year per patient     Past Surgical History:  Procedure Laterality Date  . APPENDECTOMY    . CIRCUMCISION    . COLONOSCOPY    . COLONOSCOPY WITH PROPOFOL N/A 07/10/2018   Procedure: COLONOSCOPY WITH PROPOFOL;  Surgeon: Lollie Sails, MD;  Location[facility] ENDOSCOPY;  Service: Endoscopy;  Laterality: N/A;  . COLONOSCOPY WITH PROPOFOL N/A 04/23/2019   Procedure: COLONOSCOPY WITH PROPOFOL;  Surgeon: Lollie Sails, MD;  Location: [facility] ENDOSCOPY;  Service: Endoscopy;  Laterality: N/A;  . CORONARY STENT INTERVENTION N/A 10/03/2019    Procedure: CORONARY STENT INTERVENTION;  Surgeon: Yolonda Kida, MD;  Location: [place] CV LAB;  Service: Cardiovascular;  Laterality: N/A;  . ESOPHAGOGASTRODUODENOSCOPY    . ESOPHAGOGASTRODUODENOSCOPY (EGD) WITH PROPOFOL N/A 04/23/2019   Procedure: ESOPHAGOGASTRODUODENOSCOPY (EGD) WITH PROPOFOL;  Surgeon: Lollie Sails, MD;  Location: [facility] ENDOSCOPY;  Service: Endoscopy;  Laterality: N/A;  . JOINT REPLACEMENT    . 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 KNEE ARTHROSCOPY Right   . LEFT HEART CATH AND CORONARY ANGIOGRAPHY N/A 10/03/2019   Procedure: Left Heart Cath and possible Coronary intervention;  Surgeon: Dionisio David, MD;  Location: [place] CV LAB;  Service: Cardiovascular;  Laterality: N/A;  . TONSILLECTOMY    . VISCERAL ANGIOGRAPHY N/A 05/23/2019   Procedure: VISCERAL ANGIOGRAPHY;  Surgeon: Algernon Huxley, MD;  Location: [place] CV LAB;  Service: Cardiovascular;  Laterality: N/A;  . VISCERAL ANGIOGRAPHY N/A 11/04/2019   Procedure: VISCERAL ANGIOGRAPHY;  Surgeon: Algernon Huxley, MD;  Location: [place] CV LAB;  Service: Cardiovascular;  Laterality: N/A;    Vitals:   09/22/20 0855  BP: (!) 172/52  Pulse: (!) 59     Subjective Assessment - 09/22/20 0855  Subjective Patient reports multiple bruises from falling recently. He reports his last fall, he was stepping up into the work area and his foot tripped over the step and fell. 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 Patient presents to therapy with RW. He reports its working okay but he needs to get some skiis for the back end. Patient reports doing HEP 1x in the last week. He reports not having time and oversleeping as reason for not being adherent.    Pertinent History 75 yo Male reports history of imbalance. He is s/p RLE TKA  approximately 3 years ago. He also reports history of neuropathy in BLE (R>L) which does affect his balance. His recent EMG study shows Right partial femoral neuropathy and generalized length dependent peripheral neuropathy; He did outpatient PT in Sept-Oct 2021 and stopped due to uncontrolled BP and other medical issues.  He presents to therapy with SPC. He reports not using cane at work so that he has use of UE for work tasks. He will use SPC when at home especially with stair negotiation; He reports multiple falls (falling just about every day), He reports falling about a month ago straight on his buttocks and reports increased bruising. He denies any fracture. He reports still having the buttocks pain especially with sitting. He is still working at [employer] and reports standing on them 6-7 hours a day. he reports increased pain when he sits down; He reports his BP has been better controlled. He does monitor his BP at home. He reports he is still having episodes of being extremely fatigued and "out of it" having a hard time doing activity at least 1x a day. Patient did receive outpatient PT earlier in the year for RLE weakness with no improvement; Therapy consisted of strengthening, ultrasound, with no improvement; He was given a HEP and reports he would do them sometimes; He is not doing them now. 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  His celiac velocities are also elevated today. He does have a RW at home but states he hasn't been using it.    Limitations Standing;Walking    Currently in Pain? Yes    Pain Score 2     Pain Location Buttocks     Pain Orientation Lower    Pain Descriptors / Indicators Aching;Sore    Pain Type Chronic pain    Pain Onset More than a month ago    Pain Frequency Intermittent    Aggravating Factors  worse with sitting    Pain Relieving Factors standing/walking    Effect of Pain on Daily Activities decreased activity tolerance;    Multiple Pain Sites No               TREATMENT: LE strengthening: Seated LAQ RLE only with ankle DF 2.5# x20 reps each LE with min VCs to do slowly for better motor control and strengthening;   Standing:  -Hip flexion march 2.5# x15 reps each LE -Hip abduction 2.5# x15 reps each LE -BLE heel raises 2.5# x15 reps each LE Required min VCs for proper positioning and to increase ROM for better tolerance;   Leg  press: BLE 40# x10, required min A to get on/off machine; Patient also required min VCs to slow down LE movement for better motor control;  RLE only 25# 2x10 with min VCs for proper positioning for optimal muscle strengthening;   Forward step ups on 4 inch step with 1 rail assist x10 reps RLE, required min A for safety and cues for forward weight shift for better step negotiation;   Patient tolerated session fair. He does require increased time with LE exercise and reports increased fatigue. He denies any increase in pain; Reinforced importance of HEP adherence for better LE strengthening and cardiovascular endurance; Patient verbalized understanding;                        PT Education - 09/22/20 0905    Education Details LE strengthening, HEP    Person(s) Educated Patient    Methods Explanation;Verbal cues    Comprehension Verbalized understanding;Returned demonstration;Verbal cues required;Need further instruction            PT Short Term Goals - 09/17/20 1049      PT SHORT TERM GOAL #1   Title Patient will be adherent to HEP at least 3x a week to improve functional strength and balance for better safety at home.    Time 4     Period Weeks    Status On-going    Target Date 10/07/20      PT SHORT TERM GOAL #2   Title Patient will deny any falls over last 2 weeks to exhibit improvement in safety awareness and improved balance at home and at work.    Baseline Patient falls    Time 4    Period Weeks    Status On-going    Target Date 10/07/20             PT Long Term Goals - 09/17/20 1050      PT LONG TERM GOAL #1   Title Patient will increase Berg Balance score by > 6 points to demonstrate decreased fall risk during functional activities.    Baseline tested on 09/17/20: 36/56    Time 4    Period Weeks    Status On-going      PT LONG TERM GOAL #2   Title Patient will increase 10 meter walk test to >0.8 m/s as to improve gait speed for better community ambulation and to reduce fall risk.    Baseline 12/28: 0.44 m/s    Time 4    Period Weeks    Status On-going      PT LONG TERM GOAL #3   Title Patient will improve FOTO score to >60% to indicate improved functional mobility at home and at work.    Baseline 12/28: 48%    Time 4    Period Weeks    Status On-going    Target Date 10/07/20      PT LONG TERM GOAL #4   Title Patient will reduce 5 times sit<>Stand to <20 sec to indicate improved transfer ability and improved LE functional strength.    Baseline 12/28: 41 sec    Time 4    Period Weeks    Status On-going    Target Date 10/07/20                 Plan - 09/22/20 0908    Clinical Impression Statement Patient motivated and participated well within session. He was instructed in advanced LE strengthening exercise. Reinforced importance of  HEP adherence as patient reports only doing HEP 1x in the last week. Patient instructed on importance of adherence for improved strengthening; Patient instructed in advanced LE strengthening. He does require min VCs for proper positioning and exercise technique. He reports increased fatigue with advanced exercise requiring short seated rest breaks.     Personal Factors and Comorbidities Age;Comorbidity 3+;Past/Current Experience;Time since onset of injury/illness/exacerbation    Comorbidities Depression, HTN, DMx2, hypercholesterolemia, GERD, neuropathy    Examination-Activity Limitations Squat;Stairs;Locomotion Level;Stand;Transfers    Examination-Participation Restrictions Cleaning;Community Activity;Meal Prep;Occupation;Shop;Volunteer;Yard Work    Conservation officer, historic buildingstability/Clinical Decision Making Evolving/Moderate complexity    Rehab Potential Fair    PT Frequency 2x / week    PT Duration 4 weeks    PT Treatment/Interventions Cryotherapy;Electrical Stimulation;Moist Heat;Gait training;Stair training;Functional mobility training;Therapeutic activities;Therapeutic exercise;Balance training;Neuromuscular re-education;Patient/family education;Orthotic Fit/Training;Energy conservation    PT Next Visit Plan work on balance/strengthening,    PT Home Exercise Plan initiated- provided with instruction on how to increase adherence including to try some exercise at work    Consulted and Agree with Plan of Care Patient           Patient will benefit from skilled therapeutic intervention in order to improve the following deficits and impairments:  Abnormal gait,Decreased balance,Decreased endurance,Decreased mobility,Difficulty walking,Decreased activity tolerance,Decreased strength  Visit Diagnosis: Unsteadiness on feet  History of falling  Muscle weakness (generalized)     Problem List Patient Active Problem List   Diagnosis Date Noted  . Anemia in chronic kidney disease 05/28/2020  . Benign hypertensive kidney disease with chronic kidney disease 05/28/2020  . Proteinuria 05/28/2020  . Falls frequently 04/27/2020  . Use of cane as ambulatory aid 04/27/2020  . Numbness and tingling of both legs 02/18/2020  . Primary osteoarthritis of left knee 01/04/2020  . Diabetic retinopathy (HCC) 01/01/2020  . ASCVD (arteriosclerotic cardiovascular disease)  10/16/2019  . Hospitalization within last 30 days 10/16/2019  . Hyperkalemia 10/14/2019  . Pseudoaneurysm of right femoral artery (HCC) 10/14/2019  . AKI (acute kidney injury) (HCC) 10/13/2019  . Unstable angina (HCC) 10/02/2019  . Chest pain 10/01/2019  . Uncontrolled type 2 diabetes mellitus with hyperglycemia, with long-term current use of insulin (HCC) 09/27/2019  . Chronic mesenteric ischemia (HCC) 05/06/2019  . Abdominal pain 05/03/2019  . Acute on chronic renal failure (HCC) 03/23/2019  . AMS (altered mental status) 12/24/2018  . Dizziness 06/30/2018  . HLD (hyperlipidemia) 02/23/2018  . Aortic atherosclerosis (HCC) 11/02/2017  . Healthcare maintenance 12/26/2016  . DM type 2 with diabetic peripheral neuropathy (HCC) 11/09/2016  . Presence of right artificial knee joint 08/14/2016  . Bilateral carotid artery disease (HCC) 07/12/2016  . 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 Acute diarrhea 05/30/2016  . Encephalopathy acute 05/30/2016  . Anemia 05/30/2016  . Acute on chronic renal insufficiency 05/30/2016  . Diarrhea 05/30/2016  . Stage 3b chronic kidney disease (HCC) 03/30/2015  . Depression, major, in remission (HCC) 08/02/2014  . Severe obesity (BMI 35.0-39.9) with comorbidity (HCC) 07/12/2014  . GERD (gastroesophageal reflux disease) 03/08/2014  . Hyperlipidemia associated with type 2 diabetes mellitus (HCC) 03/08/2014  . 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/24/2014  . Diabetes mellitus with stage 3 chronic kidney disease (HCC) 01/21/2014  . HTN (hypertension), benign 01/21/2014  . OSA (obstructive sleep apnea) 01/21/2014  . Unspecified transient cerebral ischemia 05/29/2012    Maja Mccaffery PT, DPT 09/22/2020, 11:29 AM  [place] [facility] [address] [place], [place], [address] Phone: [phone]   Fax:  [phone]  Name: COREYON COWHERD MRN: [id number] Date of Birth: 07-Jun-1946

## 2020-09-24 ENCOUNTER — Encounter: Payer: Self-pay | Admitting: Physical Therapy

## 2020-09-24 ENCOUNTER — Ambulatory Visit: Payer: Medicare HMO | Admitting: Physical Therapy

## 2020-09-24 ENCOUNTER — Other Ambulatory Visit: Payer: Self-pay

## 2020-09-24 VITALS — BP 166/61

## 2020-09-24 DIAGNOSIS — M6281 Muscle weakness (generalized): Secondary | ICD-10-CM | POA: Diagnosis not present

## 2020-09-24 DIAGNOSIS — Z9181 History of falling: Secondary | ICD-10-CM

## 2020-09-24 DIAGNOSIS — R2681 Unsteadiness on feet: Secondary | ICD-10-CM

## 2020-09-24 NOTE — Patient Instructions (Signed)
Access Code: [email or web address] URL: [email or web address] Date: 09/24/2020 Prepared by: Blanche East  Exercises Sit to Stand without Arm Support - 1 x daily - 7 x weekly - 1 sets - 10 reps Standing March with Counter Support - 1 x daily - 7 x weekly - 1 sets - 15 reps Standing Partial Squat - 1 x daily - 7 x weekly - 1 sets - 10 reps Seated Long Arc Quad - 1 x daily - 7 x weekly - 1 sets - 15 reps - 5 sec hold hold

## 2020-09-24 NOTE — Therapy (Signed)
[place] MAIN [facility] SERVICES [address] [place], [place], [address] Phone: [phone]   Fax:  [phone]  Physical Therapy Treatment  Patient Details  Name: Patrick Turner MRN: [id number] Date of Birth: Mar 01, 1946 Referring Provider (PT): Dr. Marry Guan   Encounter Date: 09/24/2020   PT End of Session - 09/24/20 1014    Visit Number 5    Number of Visits 9    Date for PT Re-Evaluation 10/07/20    Authorization Type [employer], $40 copay    Authorization Time Period Cert written for [id number]; award letter says 09/10/20-10/11/20    PT Start Time 1015    PT Stop Time 1100    PT Time Calculation (min) 45 min    Equipment Utilized During Treatment Gait belt    Activity Tolerance Patient tolerated treatment well;No increased pain;Patient limited by fatigue    Behavior During Therapy [facility] for tasks assessed/performed           Past Medical History:  Diagnosis Date  . Altered mental status   . Anemia   . Anxiety   . Aortic atherosclerosis ([employer])   . Arthritis   . Coronary artery disease   . CRD (chronic renal disease)   . Depression   . Diabetes mellitus without complication ([place])   . Encephalopathy acute   . GERD (gastroesophageal reflux disease)   . Headache   . Hypercholesteremia   . Hypertension   . Neuropathy   . Severe obesity ([place])   . Sleep apnea   . Sleep terror    per patient this year per patient     Past Surgical History:  Procedure Laterality Date  . APPENDECTOMY    . CIRCUMCISION    . COLONOSCOPY    . COLONOSCOPY WITH PROPOFOL N/A 07/10/2018   Procedure: COLONOSCOPY WITH PROPOFOL;  Surgeon: Lollie Sails, MD;  Location: [facility] ENDOSCOPY;  Service: Endoscopy;  Laterality: N/A;  . COLONOSCOPY WITH PROPOFOL N/A 04/23/2019   Procedure: COLONOSCOPY WITH PROPOFOL;  Surgeon: Lollie Sails, MD;  Location: [facility] ENDOSCOPY;  Service: Endoscopy;  Laterality: N/A;  . CORONARY STENT INTERVENTION N/A 10/03/2019    Procedure: CORONARY STENT INTERVENTION;  Surgeon: Yolonda Kida, MD;  Location: [place] CV LAB;  Service: Cardiovascular;  Laterality: N/A;  . ESOPHAGOGASTRODUODENOSCOPY    . ESOPHAGOGASTRODUODENOSCOPY (EGD) WITH PROPOFOL N/A 04/23/2019   Procedure: ESOPHAGOGASTRODUODENOSCOPY (EGD) WITH PROPOFOL;  Surgeon: Lollie Sails, MD;  Location: [facility] ENDOSCOPY;  Service: Endoscopy;  Laterality: N/A;  . JOINT REPLACEMENT    . 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 KNEE ARTHROSCOPY Right   . LEFT HEART CATH AND CORONARY ANGIOGRAPHY N/A 10/03/2019   Procedure: Left Heart Cath and possible Coronary intervention;  Surgeon: Dionisio David, MD;  Location: [place] CV LAB;  Service: Cardiovascular;  Laterality: N/A;  . TONSILLECTOMY    . VISCERAL ANGIOGRAPHY N/A 05/23/2019   Procedure: VISCERAL ANGIOGRAPHY;  Surgeon: Algernon Huxley, MD;  Location: [place] CV LAB;  Service: Cardiovascular;  Laterality: N/A;  . VISCERAL ANGIOGRAPHY N/A 11/04/2019   Procedure: VISCERAL ANGIOGRAPHY;  Surgeon: Algernon Huxley, MD;  Location: [place] CV LAB;  Service: Cardiovascular;  Laterality: N/A;    Vitals:   09/24/20 1021  BP: (!) 166/61     Subjective Assessment - 09/24/20 1021    Subjective Patient reports doing  HEP 2x in the last week; He reports overall less buttocks pain; He reports 2 new falls last night (reports he fell out of bed);    Pertinent History 75 yo Male reports history of imbalance. He is s/p RLE TKA approximately 3 years ago. He also reports history of neuropathy in BLE (R>L) which does affect his balance. His recent EMG study shows Right partial femoral neuropathy and generalized length dependent peripheral neuropathy; He did outpatient PT in Sept-Oct 2021 and stopped due to uncontrolled BP and other medical issues.  He presents to  therapy with SPC. He reports not using cane at work so that he has use of UE for work tasks. He will use SPC when at home especially with stair negotiation; He reports multiple falls (falling just about every day), He reports falling about a month ago straight on his buttocks and reports increased bruising. He denies any fracture. He reports still having the buttocks pain especially with sitting. He is still working at [employer] and reports standing on them 6-7 hours a day. he reports increased pain when he sits down; He reports his BP has been better controlled. He does monitor his BP at home. He reports he is still having episodes of being extremely fatigued and "out of it" having a hard time doing activity at least 1x a day. Patient did receive outpatient PT earlier in the year for RLE weakness with no improvement; Therapy consisted of strengthening, ultrasound, with no improvement; He was given a HEP and reports he would do them sometimes; He is not doing them now. 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  His celiac velocities are also elevated today. He does have a RW at home but states he hasn't been using it.    Limitations Standing;Walking    Currently in Pain? Yes    Pain Score 1     Pain Location Buttocks    Pain Orientation Lower    Pain Descriptors / Indicators Aching;Sore    Pain Onset More than a month ago    Pain Frequency Intermittent    Aggravating Factors  worse with sitting    Pain Relieving Factors standing/walking    Effect of Pain on Daily Activities decreased activity tolerance    Multiple Pain Sites No                 TREATMENT: LE strengthening: Seated LAQ RLE only with ankle DF 5 sec hold x15 reps RLE with min VCs to do slowly for better motor control and strengthening;   Instructed patient in advanced LE strengthening as part of HEP: -sit<>Stand without HHA x10 reps with supervision with cues for leaning forward and increased push through LE; Recommend patient have RW or chair nearby to grab ahold of for safety as needed; -standing hip flexion march x15 reps each LE with B rail assist; -mini squat x10 reps with BUE assist for increased safety awareness;    Leg press: BLE 55# x10, required min A to get on/off machine; Patient also required min VCs to slow down LE movement for better motor control;  RLE only 25# 2x10 with min VCs for proper positioning for optimal muscle strengthening;  Forward step ups on 4 inch step with 2-1 rail assist x15 reps RLE, required min A for safety and cues for forward weight shift for better step negotiation; Following step ups re-assessed BP: 191/58 with HR 62 bpm indicating impaired cardiovascular endurance with significant increase in BP with standing activity;  Had patient rest for a minute to allow BP to recover: 2nd BP 185/62  Instructed patient in side stepping with red tband around BLE around ankles x10 feet x3 laps each direction with BUE rail assist;  Standing hip extension red tband x10 reps each LE with BUE rail assist, with min VCS to avoid forward lean to isolate gluteal strengthening;   Patient tolerated session fair. He does require increased time with LE exercise and reports increased fatigue. He denies any increase in pain; Reinforced importance of HEP adherence for better LE strengthening and cardiovascular endurance; Patient verbalized understanding; Advanced HEP- see patient instructions;  Vitals monitored throughout session;                      PT Education - 09/24/20 1014    Education Details LE strengthening, HEP     Person(s) Educated Patient    Methods Explanation;Verbal cues    Comprehension Verbalized understanding;Returned demonstration;Verbal cues required;Need further instruction            PT Short Term Goals - 09/17/20 1049      PT SHORT TERM GOAL #1   Title Patient will be adherent to HEP at least 3x a week to improve functional strength and balance for better safety at home.    Time 4    Period Weeks    Status On-going    Target Date 10/07/20      PT SHORT TERM GOAL #2   Title Patient will deny any falls over last 2 weeks to exhibit improvement in safety awareness and improved balance at home and at work.    Baseline Patient falls    Time 4    Period Weeks    Status On-going    Target Date 10/07/20             PT Long Term Goals - 09/17/20 1050      PT LONG TERM GOAL #1   Title Patient will increase Berg Balance score by > 6 points to demonstrate decreased fall risk during functional activities.    Baseline tested on 09/17/20: 36/56    Time 4    Period Weeks    Status On-going      PT LONG TERM GOAL #2   Title Patient will increase 10 meter walk test to >0.8 m/s as to improve gait speed for better community ambulation and to reduce fall risk.    Baseline 12/28: 0.44 m/s    Time 4    Period Weeks    Status On-going      PT LONG TERM GOAL #3   Title Patient will improve FOTO score to >60% to indicate improved functional mobility at home and at work.    Baseline 12/28: 48%    Time 4    Period Weeks    Status On-going    Target Date 10/07/20      PT LONG TERM GOAL #4   Title Patient will reduce 5 times sit<>Stand to <20 sec to indicate improved transfer ability and improved LE functional strength.    Baseline 12/28: 41 sec    Time 4    Period Weeks    Status  On-going    Target Date 10/07/20                 Plan - 09/24/20 1024    Clinical Impression Statement Patient motivated and participated fair within session. He continues to have elevated BP  which is monitored throughout session. He was instructed in advanced LE strengthening exercise. Patient does require min VCS for proper exercise technique/positioning for optimal strengthening. Patient denies any increase in pain with advanced exercise but does report increased fatigue. he continues to have high fall history which is variable. Last night he reports falling 2 times out of bed. Earlier in the week he reported fall when tripping over a step. Patient would benefit from additional skilled PT Intervention to improve strength, balance and mobility;    Personal Factors and Comorbidities Age;Comorbidity 3+;Past/Current Experience;Time since onset of injury/illness/exacerbation    Comorbidities Depression, HTN, DMx2, hypercholesterolemia, GERD, neuropathy    Examination-Activity Limitations Squat;Stairs;Locomotion Level;Stand;Transfers    Examination-Participation Restrictions Cleaning;Community Activity;Meal Prep;Occupation;Shop;Volunteer;Yard Work    [employer] Evolving/Moderate complexity    Rehab Potential Fair    PT Frequency 2x / week    PT Duration 4 weeks    PT Treatment/Interventions Cryotherapy;Electrical Stimulation;Moist Heat;Gait training;Stair training;Functional mobility training;Therapeutic activities;Therapeutic exercise;Balance training;Neuromuscular re-education;Patient/family education;Orthotic Fit/Training;Energy conservation    PT Next Visit Plan work on balance/strengthening,    PT Home Exercise Plan initiated- provided with instruction on how to increase adherence including to try some exercise at work    Consulted and Agree with Plan of Care Patient           Patient will benefit from skilled therapeutic intervention in order to improve the following deficits and impairments:  Abnormal gait,Decreased balance,Decreased endurance,Decreased mobility,Difficulty walking,Decreased activity tolerance,Decreased strength  Visit Diagnosis: Muscle  weakness (generalized)  Unsteadiness on feet  History of falling     Problem List Patient Active Problem List   Diagnosis Date Noted  . Anemia in chronic kidney disease 05/28/2020  . Benign hypertensive kidney disease with chronic kidney disease 05/28/2020  . Proteinuria 05/28/2020  . Falls frequently 04/27/2020  . Use of cane as ambulatory aid 04/27/2020  . Numbness and tingling of both legs 02/18/2020  . Primary osteoarthritis of left knee 01/04/2020  . Diabetic retinopathy ([place]) 01/01/2020  . ASCVD (arteriosclerotic cardiovascular disease) 10/16/2019  . Hospitalization within last 30 days 10/16/2019  . Hyperkalemia 10/14/2019  . Pseudoaneurysm of right femoral artery ([place]) 10/14/2019  . AKI (acute kidney injury) ([place]) 10/13/2019  . Unstable angina ([place]) 10/02/2019  . Chest pain 10/01/2019  . Uncontrolled type 2 diabetes mellitus with hyperglycemia, with long-term current use of insulin ([place]) 09/27/2019  . Chronic mesenteric ischemia ([place]) 05/06/2019  . Abdominal pain 05/03/2019  . Acute on chronic renal failure ([place]) 03/23/2019  . AMS (altered mental status) 12/24/2018  . Dizziness 06/30/2018  . HLD (hyperlipidemia) 02/23/2018  . Aortic atherosclerosis ([place]) 11/02/2017  . Healthcare maintenance 12/26/2016  . DM type 2 with diabetic peripheral neuropathy ([place]) 11/09/2016  . Presence of right artificial knee joint 08/14/2016  . Bilateral carotid artery disease ([place]) 07/12/2016  . 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 Acute diarrhea 05/30/2016  . Encephalopathy acute 05/30/2016  . Anemia 05/30/2016  . Acute on chronic renal insufficiency 05/30/2016  . Diarrhea 05/30/2016  . Stage 3b chronic kidney disease ([place]) 03/30/2015  . Depression, major, in remission ([place]) 08/02/2014  . Severe obesity (BMI 35.0-39.9) with comorbidity ([place]) 07/12/2014  . GERD (gastroesophageal reflux disease) 03/08/2014  . Hyperlipidemia  associated with type 2 diabetes mellitus ([place])  03/08/2014  . Drug-induced parkinsonism ([place]) 01/24/2014  . Diabetes mellitus with stage 3 chronic kidney disease ([place]) 01/21/2014  . HTN (hypertension), benign 01/21/2014  . OSA (obstructive sleep apnea) 01/21/2014  . Unspecified transient cerebral ischemia 05/29/2012    Ming Mcmannis PT, DPT 09/24/2020, 10:56 AM  [place] MAIN [facility] SERVICES [address] [place], [place], [address] Phone: [phone]   Fax:  [phone]  Name: ERSKIN FEDDER MRN: [id number] Date of Birth: 23-Jan-1946

## 2020-09-29 ENCOUNTER — Ambulatory Visit: Payer: Medicare HMO | Admitting: Physical Therapy

## 2020-09-30 DIAGNOSIS — E782 Mixed hyperlipidemia: Secondary | ICD-10-CM | POA: Diagnosis not present

## 2020-09-30 DIAGNOSIS — I351 Nonrheumatic aortic (valve) insufficiency: Secondary | ICD-10-CM | POA: Diagnosis not present

## 2020-09-30 DIAGNOSIS — I2510 Atherosclerotic heart disease of native coronary artery without angina pectoris: Secondary | ICD-10-CM | POA: Diagnosis not present

## 2020-09-30 DIAGNOSIS — I10 Essential (primary) hypertension: Secondary | ICD-10-CM | POA: Diagnosis not present

## 2020-09-30 DIAGNOSIS — Z9861 Coronary angioplasty status: Secondary | ICD-10-CM | POA: Diagnosis not present

## 2020-10-06 ENCOUNTER — Ambulatory Visit: Payer: Medicare HMO

## 2020-10-06 ENCOUNTER — Other Ambulatory Visit: Payer: Self-pay

## 2020-10-06 DIAGNOSIS — Z9181 History of falling: Secondary | ICD-10-CM | POA: Diagnosis not present

## 2020-10-06 DIAGNOSIS — R2681 Unsteadiness on feet: Secondary | ICD-10-CM | POA: Diagnosis not present

## 2020-10-06 DIAGNOSIS — M6281 Muscle weakness (generalized): Secondary | ICD-10-CM

## 2020-10-06 NOTE — Therapy (Signed)
[place] MAIN [facility] SERVICES [address] [place], [place], [address] Phone: [phone]   Fax:  [phone]  Physical Therapy Treatment  Patient Details  Name: Patrick Turner MRN: [id number] Date of Birth: [birth year] Referring Provider (PT): Dr. Marry Guan   Encounter Date: 10/06/2020   PT End of Session - 10/06/20 0907    Visit Number 7    Number of Visits 9    Date for PT Re-Evaluation 10/07/20    Authorization Type [employer], $40 copay    Authorization Time Period Cert written for [id number]; award letter says 09/10/20-10/11/20    PT Start Time [phone]    PT Stop Time 0924    PT Time Calculation (min) 42 min    Equipment Utilized During Treatment Gait belt    Activity Tolerance Patient tolerated treatment well;No increased pain;Patient limited by fatigue    Behavior During Therapy [facility] for tasks assessed/performed           Past Medical History:  Diagnosis Date  . Altered mental status   . Anemia   . Anxiety   . Aortic atherosclerosis ([place])   . Arthritis   . Coronary artery disease   . CRD (chronic renal disease)   . Depression   . Diabetes mellitus without complication ([place])   . Encephalopathy acute   . GERD (gastroesophageal reflux disease)   . Headache   . Hypercholesteremia   . Hypertension   . Neuropathy   . Severe obesity ([place])   . Sleep apnea   . Sleep terror    per patient this year per patient     Past Surgical History:  Procedure Laterality Date  . APPENDECTOMY    . CIRCUMCISION    . COLONOSCOPY    . COLONOSCOPY WITH PROPOFOL N/A 07/10/2018   Procedure: COLONOSCOPY WITH PROPOFOL;  Surgeon: Lollie Sails, MD;  Location: [facility] ENDOSCOPY;  Service: Endoscopy;  Laterality: N/A;  . COLONOSCOPY WITH PROPOFOL N/A 04/23/2019   Procedure: COLONOSCOPY WITH PROPOFOL;  Surgeon: Lollie Sails, MD;  Location: [facility] ENDOSCOPY;  Service: Endoscopy;  Laterality: N/A;  . CORONARY STENT INTERVENTION N/A 10/03/2019    Procedure: CORONARY STENT INTERVENTION;  Surgeon: Yolonda Kida, MD;  Location: [place] CV LAB;  Service: Cardiovascular;  Laterality: N/A;  . ESOPHAGOGASTRODUODENOSCOPY    . ESOPHAGOGASTRODUODENOSCOPY (EGD) WITH PROPOFOL N/A 04/23/2019   Procedure: ESOPHAGOGASTRODUODENOSCOPY (EGD) WITH PROPOFOL;  Surgeon: Lollie Sails, MD;  Location: [facility] ENDOSCOPY;  Service: Endoscopy;  Laterality: N/A;  . JOINT REPLACEMENT    . 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 KNEE ARTHROSCOPY Right   . LEFT HEART CATH AND CORONARY ANGIOGRAPHY N/A 10/03/2019   Procedure: Left Heart Cath and possible Coronary intervention;  Surgeon: Dionisio David, MD;  Location: [place] CV LAB;  Service: Cardiovascular;  Laterality: N/A;  . TONSILLECTOMY    . VISCERAL ANGIOGRAPHY N/A 05/23/2019   Procedure: VISCERAL ANGIOGRAPHY;  Surgeon: Algernon Huxley, MD;  Location: [place] CV LAB;  Service: Cardiovascular;  Laterality: N/A;  . VISCERAL ANGIOGRAPHY N/A 11/04/2019   Procedure: VISCERAL ANGIOGRAPHY;  Surgeon: Algernon Huxley, MD;  Location: [place] CV LAB;  Service: Cardiovascular;  Laterality: N/A;    There were no vitals filed for this visit.   Subjective Assessment - 10/06/20 0848    Subjective Patient reports frequent falls past 2  weeks with no injuries.    Pertinent History 75 yo Male reports history of imbalance. He is s/p RLE TKA approximately 3 years ago. He also reports history of neuropathy in BLE (R>L) which does affect his balance. His recent EMG study shows Right partial femoral neuropathy and generalized length dependent peripheral neuropathy; He did outpatient PT in Sept-Oct 2021 and stopped due to uncontrolled BP and other medical issues.  He presents to therapy with SPC. He reports not using cane at work so that he has use of UE for work  tasks. He will use SPC when at home especially with stair negotiation; He reports multiple falls (falling just about every day), He reports falling about a month ago straight on his buttocks and reports increased bruising. He denies any fracture. He reports still having the buttocks pain especially with sitting. He is still working at [employer] and reports standing on them 6-7 hours a day. he reports increased pain when he sits down; He reports his BP has been better controlled. He does monitor his BP at home. He reports he is still having episodes of being extremely fatigued and "out of it" having a hard time doing activity at least 1x a day. Patient did receive outpatient PT earlier in the year for RLE weakness with no improvement; Therapy consisted of strengthening, ultrasound, with no improvement; He was given a HEP and reports he would do them sometimes; He is not doing them now. 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  His celiac velocities are also elevated today. He does have a RW at home but states he hasn't been using it.    Limitations Standing;Walking    Currently in Pain? Yes    Pain Score 3     Pain Location Buttocks    Pain Onset More than a month ago         pre-treatment BP 189/53 Sp02 96% HR 95  Goals updated  Leg press: BLE 55# x10, required min A to get on/off machine; Patient also required min VCs to slow down LE movement for better motor control;   standing hip flexion march x15 reps each LE with B rail assist;  mini squat x10 reps with BUE assist for increased safety   Siide stepping with red tband around BLE  around ankles x10 feet x3 laps each direction with BUE support Standing hip extension red tband x10 reps each LE with BUE support   post treatment BP 208/61 HR 55  discussed with patient importance of contacting his MD regarding his BP due to consistency of it being high.  The patient voiced understanding.                       PT Education - 10/06/20 0849    Education Details HEP, progress, need to contact MD regarding BP    Person(s) Educated Patient    Methods Explanation    Comprehension Verbalized understanding            PT Short Term Goals - 10/06/20 0847      PT SHORT TERM GOAL #1   Title Patient will be adherent to HEP at least  3x a week to improve functional strength and balance for better safety at home.    Baseline 10/06/20  pt reports he has been working and has not had as much time for exercises at home.    Time 4    Period Weeks    Status On-going    Target Date 10/07/20      PT SHORT TERM GOAL #2   Title Patient will deny any falls over last 2 weeks to exhibit improvement in safety awareness and improved balance at home and at work.    Baseline Patient falls 10/06/20  pt reports falling 6 times in the past 2 weeks including out of bed and onto the floor and one at work    Time 4    Period Weeks    Status On-going    Target Date 10/07/20             PT Long Term Goals - 10/06/20 0852      PT LONG TERM GOAL #1   Title Patient will increase Berg Balance score by > 6 points to demonstrate decreased fall risk during functional activities.    Baseline tested on 09/17/20: 36/56    Time 4    Period Weeks    Status On-going      PT LONG TERM GOAL #2   Title Patient will increase 10 meter walk test to >0.8 m/s as to improve gait speed for better community ambulation and to reduce fall risk.    Baseline 12/28: 0.44 m/s, 10/06/20 .52 m/s    Time 4    Period Weeks    Status On-going      PT LONG TERM GOAL #3   Title Patient will improve FOTO  score to >60% to indicate improved functional mobility at home and at work.    Baseline 12/28: 48%    Time 4    Period Weeks    Status On-going      PT LONG TERM GOAL #4   Title Patient will reduce 5 times sit<>Stand to <20 sec to indicate improved transfer ability and improved LE functional strength.    Baseline 12/28: 41 sec, 10/06/20  33.22 seconds    Time 4    Period Weeks    Status On-going                 Plan - 10/06/20 0908    Clinical Impression Statement Patient is making progress towards goals.  see updated.  The patient is noncompliant with HEP and educated patient in importance of performing his exercises at home 3-4 times per week.  Patient's vitals were monitored throughout session.  The patient does have elevated BP and discussed with patient making an appt with his MD regarding this.  The patient continues to benefit from additional skilled PT services to improve LE strength and balance for reduced fall risk.    Personal Factors and Comorbidities Age;Comorbidity 3+;Past/Current Experience;Time since onset of injury/illness/exacerbation    Comorbidities Depression, HTN, DMx2, hypercholesterolemia, GERD, neuropathy    Examination-Activity Limitations Squat;Stairs;Locomotion Level;Stand;Transfers    Examination-Participation Restrictions Cleaning;Community Activity;Meal Prep;Occupation;Shop;Volunteer;Yard Work    [employer] Evolving/Moderate complexity    Rehab Potential Fair    PT Frequency 2x / week    PT Duration 4 weeks    PT Treatment/Interventions Cryotherapy;Electrical Stimulation;Moist Heat;Gait training;Stair training;Functional mobility training;Therapeutic activities;Therapeutic exercise;Balance training;Neuromuscular re-education;Patient/family education;Orthotic Fit/Training;Energy conservation    PT Next Visit Plan work on balance/strengthening,    PT Home Exercise Plan initiated-  provided with instruction on how to increase  adherence including to try some exercise at work    [employer] and Agree with Plan of Care Patient           Patient will benefit from skilled therapeutic intervention in order to improve the following deficits and impairments:  Abnormal gait,Decreased balance,Decreased endurance,Decreased mobility,Difficulty walking,Decreased activity tolerance,Decreased strength  Visit Diagnosis: Muscle weakness (generalized)  Unsteadiness on feet  History of falling     Problem List Patient Active Problem List   Diagnosis Date Noted  . Anemia in chronic kidney disease 05/28/2020  . Benign hypertensive kidney disease with chronic kidney disease 05/28/2020  . Proteinuria 05/28/2020  . Falls frequently 04/27/2020  . Use of cane as ambulatory aid 04/27/2020  . Numbness and tingling of both legs 02/18/2020  . Primary osteoarthritis of left knee 01/04/2020  . Diabetic retinopathy ([place]) 01/01/2020  . ASCVD (arteriosclerotic cardiovascular disease) 10/16/2019  . Hospitalization within last 30 days 10/16/2019  . Hyperkalemia 10/14/2019  . Pseudoaneurysm of right femoral artery ([place]) 10/14/2019  . AKI (acute kidney injury) ([place]) 10/13/2019  . Unstable angina ([place]) 10/02/2019  . Chest pain 10/01/2019  . Uncontrolled type 2 diabetes mellitus with hyperglycemia, with long-term current use of insulin ([place]) 09/27/2019  . Chronic mesenteric ischemia ([place]) 05/06/2019  . Abdominal pain 05/03/2019  . Acute on chronic renal failure ([place]) 03/23/2019  . AMS (altered mental status) 12/24/2018  . Dizziness 06/30/2018  . HLD (hyperlipidemia) 02/23/2018  . Aortic atherosclerosis ([place]) 11/02/2017  . Healthcare maintenance 12/26/2016  . DM type 2 with diabetic peripheral neuropathy ([place]) 11/09/2016  . Presence of right artificial knee joint 08/14/2016  . Bilateral carotid artery disease ([place]) 07/12/2016  . 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 Acute diarrhea 05/30/2016  . Encephalopathy acute 05/30/2016   . Anemia 05/30/2016  . Acute on chronic renal insufficiency 05/30/2016  . Diarrhea 05/30/2016  . Stage 3b chronic kidney disease ([place]) 03/30/2015  . Depression, major, in remission ([place]) 08/02/2014  . Severe obesity (BMI 35.0-39.9) with comorbidity ([place]) 07/12/2014  . GERD (gastroesophageal reflux disease) 03/08/2014  . Hyperlipidemia associated with type 2 diabetes mellitus ([place]) 03/08/2014  . Drug-induced parkinsonism ([place]) 01/24/2014  . Diabetes mellitus with stage 3 chronic kidney disease ([place]) 01/21/2014  . HTN (hypertension), benign 01/21/2014  . OSA (obstructive sleep apnea) 01/21/2014  . Unspecified transient cerebral ischemia 05/29/2012    Hal Morales PT, DPT 10/06/2020, 9:25 AM  [place] MAIN [facility] SERVICES [address] [place], [place], [address] Phone: [phone]   Fax:  [phone]  Name: Patrick Turner MRN: [id number] Date of Birth: November 01, 1945

## 2020-10-12 ENCOUNTER — Ambulatory Visit: Payer: Medicare HMO | Admitting: Physical Therapy

## 2020-10-12 DIAGNOSIS — R809 Proteinuria, unspecified: Secondary | ICD-10-CM | POA: Diagnosis not present

## 2020-10-12 DIAGNOSIS — D631 Anemia in chronic kidney disease: Secondary | ICD-10-CM | POA: Diagnosis not present

## 2020-10-12 DIAGNOSIS — E875 Hyperkalemia: Secondary | ICD-10-CM | POA: Diagnosis not present

## 2020-10-12 DIAGNOSIS — I129 Hypertensive chronic kidney disease with stage 1 through stage 4 chronic kidney disease, or unspecified chronic kidney disease: Secondary | ICD-10-CM | POA: Diagnosis not present

## 2020-10-12 DIAGNOSIS — E1122 Type 2 diabetes mellitus with diabetic chronic kidney disease: Secondary | ICD-10-CM | POA: Diagnosis not present

## 2020-10-12 DIAGNOSIS — N1831 Chronic kidney disease, stage 3a: Secondary | ICD-10-CM | POA: Diagnosis not present

## 2020-10-14 ENCOUNTER — Other Ambulatory Visit: Payer: Self-pay

## 2020-10-14 ENCOUNTER — Ambulatory Visit: Payer: Medicare HMO | Attending: Neurology

## 2020-10-14 DIAGNOSIS — M6281 Muscle weakness (generalized): Secondary | ICD-10-CM | POA: Diagnosis not present

## 2020-10-14 DIAGNOSIS — Z9181 History of falling: Secondary | ICD-10-CM | POA: Diagnosis not present

## 2020-10-14 DIAGNOSIS — R2681 Unsteadiness on feet: Secondary | ICD-10-CM | POA: Insufficient documentation

## 2020-10-14 NOTE — Therapy (Signed)
[facility] SERVICES [address] [place], [place], [address] Phone: [phone]   Fax:  [phone]  Physical Therapy Treatment/RECERT  Patient Details  Name: Patrick Turner MRN: [id number] Date of Birth: 08/24/1946 Referring Provider (PT): Dr. Marry Guan   Encounter Date: 10/14/2020   PT End of Session - 10/14/20 1752    Visit Number 8    Number of Visits 16    Date for PT Re-Evaluation 11/11/20    Authorization Type [employer] M[employer], $40 copay    Authorization Time Period 1/31-11/13/20 9 PT visits    PT Start Time 1015    PT Stop Time 1059    PT Time Calculation (min) 44 min    Equipment Utilized During Treatment Gait belt    Activity Tolerance Patient tolerated treatment well;No increased pain;Patient limited by fatigue    Behavior During Therapy [facility] for tasks assessed/performed           Past Medical History:  Diagnosis Date  . Altered mental status   . Anemia   . Anxiety   . Aortic atherosclerosis ([place])   . Arthritis   . Coronary artery disease   . CRD (chronic renal disease)   . Depression   . Diabetes mellitus without complication ([place])   . Encephalopathy acute   . GERD (gastroesophageal reflux disease)   . Headache   . Hypercholesteremia   . Hypertension   . Neuropathy   . Severe obesity ([place])   . Sleep apnea   . Sleep terror    per patient this year per patient     Past Surgical History:  Procedure Laterality Date  . APPENDECTOMY    . CIRCUMCISION    . COLONOSCOPY    . COLONOSCOPY WITH PROPOFOL N/A 07/10/2018   Procedure: COLONOSCOPY WITH PROPOFOL;  Surgeon: Lollie Sails, MD;  Location: [facility] ENDOSCOPY;  Service: Endoscopy;  Laterality: N/A;  . COLONOSCOPY WITH PROPOFOL N/A 04/23/2019   Procedure: COLONOSCOPY WITH PROPOFOL;  Surgeon: Lollie Sails, MD;  Location: [facility] ENDOSCOPY;  Service: Endoscopy;  Laterality: N/A;  . CORONARY STENT INTERVENTION N/A 10/03/2019   Procedure: CORONARY STENT  INTERVENTION;  Surgeon: Yolonda Kida, MD;  Location: [place] CV LAB;  Service: Cardiovascular;  Laterality: N/A;  . ESOPHAGOGASTRODUODENOSCOPY    . ESOPHAGOGASTRODUODENOSCOPY (EGD) WITH PROPOFOL N/A 04/23/2019   Procedure: ESOPHAGOGASTRODUODENOSCOPY (EGD) WITH PROPOFOL;  Surgeon: Lollie Sails, MD;  Location: [facility] ENDOSCOPY;  Service: Endoscopy;  Laterality: N/A;  . JOINT REPLACEMENT    . 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 KNEE ARTHROSCOPY Right   . LEFT HEART CATH AND CORONARY ANGIOGRAPHY N/A 10/03/2019   Procedure: Left Heart Cath and possible Coronary intervention;  Surgeon: Dionisio David, MD;  Location: [place] CV LAB;  Service: Cardiovascular;  Laterality: N/A;  . TONSILLECTOMY    . VISCERAL ANGIOGRAPHY N/A 05/23/2019   Procedure: VISCERAL ANGIOGRAPHY;  Surgeon: Algernon Huxley, MD;  Location: [place] CV LAB;  Service: Cardiovascular;  Laterality: N/A;  . VISCERAL ANGIOGRAPHY N/A 11/04/2019   Procedure: VISCERAL ANGIOGRAPHY;  Surgeon: Algernon Huxley, MD;  Location: [place] CV LAB;  Service: Cardiovascular;  Laterality: N/A;    There were no vitals filed for this visit.   Subjective Assessment - 10/14/20 1038    Subjective Patient reports he has been having pain down L leg  today, but over the weekend the pain was down the R leg.    Pertinent History 75 yo Male reports history of imbalance. He is s/p RLE TKA approximately 3 years ago. He also reports history of neuropathy in BLE (R>L) which does affect his balance. His recent EMG study shows Right partial femoral neuropathy and generalized length dependent peripheral neuropathy; He did outpatient PT in Sept-Oct 2021 and stopped due to uncontrolled BP and other medical issues.  He presents to therapy with SPC. He reports not using cane at work so that he has use of  UE for work tasks. He will use SPC when at home especially with stair negotiation; He reports multiple falls (falling just about every day), He reports falling about a month ago straight on his buttocks and reports increased bruising. He denies any fracture. He reports still having the buttocks pain especially with sitting. He is still working at [employer] and reports standing on them 6-7 hours a day. he reports increased pain when he sits down; He reports his BP has been better controlled. He does monitor his BP at home. He reports he is still having episodes of being extremely fatigued and "out of it" having a hard time doing activity at least 1x a day. Patient did receive outpatient PT earlier in the year for RLE weakness with no improvement; Therapy consisted of strengthening, ultrasound, with no improvement; He was given a HEP and reports he would do them sometimes; He is not doing them now. 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  His celiac velocities are also elevated today. He does have a RW at home but states he hasn't been using it.    Limitations Standing;Walking    Diagnostic tests EMG study in July 2021 shows RLE partial femoral neuropathy and length dependent peripheral neuropathy;    Patient Stated Goals improve walking, be able to walk without stumbling/staggering to the side;    Currently in Pain? Yes    Pain Score 2     Pain Location Leg    Pain Orientation Right    Pain Descriptors / Indicators Aching;Burning    Pain Type Chronic pain    Pain Onset More than a month ago    Pain Frequency Intermittent              [facility] PT  Assessment - 10/14/20 0001      Standardized Balance Assessment   Standardized Balance Assessment Berg Balance Test      Berg Balance Test   Sit to Stand Able to stand without using hands and stabilize independently    Standing Unsupported Able to stand safely 2 minutes    Sitting with Back Unsupported but Feet Supported on Floor or Stool Able to sit safely and securely 2 minutes    Stand to Sit Sits safely with minimal use of hands    Transfers Able to transfer safely, minor use of hands    Standing Unsupported with Eyes Closed Able to stand 10 seconds with supervision    Standing Unsupported with Feet Together Able to place feet together independently but unable to hold for 30 seconds    From Standing, Reach Forward  with Outstretched Arm Can reach forward >12 cm safely (5")    From Standing Position, Pick up Object from [place] to pick up shoe, needs supervision    From Standing Position, Turn to Look Behind Over each Shoulder Looks behind one side only/other side shows less weight shift    Turn 360 Degrees Able to turn 360 degrees safely but slowly    Standing Unsupported, Alternately Place Feet on Step/Stool Able to complete >2 steps/needs minimal assist    Standing Unsupported, One Foot in Front Able to take small step independently and hold 30 seconds    Standing on One Leg Unable to try or needs assist to prevent fall    Total Score 39         Has been having pain down L leg today, but over the weekend down the R leg.    BP at start of session: 151/47   Goals:  HEP: Patient reports doing some exercises but not a lot,  Falls: reports falling out of bed.  BERG: 39/56  10 MWT: 14. seconds  FOTO: 53%  5x STS: 25.4 seconds     Treatment:    Side stepping with red tband around BLE around ankles x10 feet x3 laps each direction with BUE support Standing hip extension red tband x10 reps each LE with BUE support Seated RTB around ankles: alternating LAQ 15x  BP after  exercise: 161/49     Patient will benefit from an additional round of physical therapy due to progression of goals and continued need for stabilization and strengthening. Patient reports progress is noted at work but is not yet where he would like to be. He requires close monitoring of blood pressure as well as pain levels this session. . Patient instructed on importance of adherence for improved strengthening; Patient instructed in advanced LE strengthening. He does require min VCs for proper positioning and exercise technique. He reports increased fatigue with advanced exercise requiring short seated rest breaks. The patient continues to benefit from additional skilled PT services to improve LE strength and balance for reduced fall risk.            PT Education - 10/14/20 1750    Education Details goals, POC, exercise technique,    Person(s) Educated Patient    Methods Explanation;Demonstration;Tactile cues;Verbal cues    Comprehension Verbalized understanding;Returned demonstration;Verbal cues required;Tactile cues required            PT Short Term Goals - 10/14/20 1755      PT SHORT TERM GOAL #1   Title Patient will be adherent to HEP at least 3x a week to improve functional strength and balance for better safety at home.    Baseline 10/06/20  pt reports he has been working and has not had as much time for exercises at home. 2/2: improving but not yet fully compliant    Time 2    Period Weeks    Status On-going    Target Date 10/28/20      PT SHORT TERM GOAL #2   Title Patient will deny any falls over last 2 weeks to exhibit improvement in safety awareness and improved balance at home and at work.    Baseline Patient falls 10/06/20  pt reports falling 6 times in the past 2 weeks including out of bed and onto the floor and one at work 2/2: continues to fall    Time 2    Period Weeks    Status Partially Met  Target Date 10/28/20             PT Long Term Goals -  10/14/20 1037      PT LONG TERM GOAL #1   Title Patient will increase Berg Balance score by > 6 points to demonstrate decreased fall risk during functional activities.    Baseline tested on 09/17/20: 36/56 2/2: 39/56    Time 4    Period Weeks    Status Partially Met    Target Date 11/11/20      PT LONG TERM GOAL #2   Title Patient will increase 10 meter walk test to >0.8 m/s as to improve gait speed for better community ambulation and to reduce fall risk.    Baseline 12/28: 0.44 m/s, 10/06/20 .52 m/s 2/2: 0.71 m/s    Time 4    Period Weeks    Status Partially Met    Target Date 11/11/20      PT LONG TERM GOAL #3   Title Patient will improve FOTO score to >60% to indicate improved functional mobility at home and at work.    Baseline 12/28: 48% 2/2: 53%    Time 4    Period Weeks    Status Partially Met    Target Date 11/11/20      PT LONG TERM GOAL #4   Title Patient will reduce 5 times sit<>Stand to <20 sec to indicate improved transfer ability and improved LE functional strength.    Baseline 12/28: 41 sec, 10/06/20  33.22 seconds 2/2: 25.4 seconds    Time 4    Period Weeks    Status Partially Met    Target Date 11/11/20                 Plan - 10/14/20 1755    Clinical Impression Statement Patient will benefit from an additional round of physical therapy due to progression of goals and continued need for stabilization and strengthening. Patient reports progress is noted at work but is not yet where he would like to be. He requires close monitoring of blood pressure as well as pain levels this session. . Patient instructed on importance of adherence for improved strengthening; Patient instructed in advanced LE strengthening. He does require min VCs for proper positioning and exercise technique. He reports increased fatigue with advanced exercise requiring short seated rest breaks. The patient continues to benefit from additional skilled PT services to improve LE strength and  balance for reduced fall risk.    Personal Factors and Comorbidities Age;Comorbidity 3+;Past/Current Experience;Time since onset of injury/illness/exacerbation    Comorbidities Depression, HTN, DMx2, hypercholesterolemia, GERD, neuropathy    Examination-Activity Limitations Squat;Stairs;Locomotion Level;Stand;Transfers    Examination-Participation Restrictions Cleaning;Community Activity;Meal Prep;Occupation;Shop;Volunteer;Yard Work    [employer] Evolving/Moderate complexity    Rehab Potential Fair    PT Frequency 2x / week    PT Duration 4 weeks    PT Treatment/Interventions Cryotherapy;Electrical Stimulation;Moist Heat;Gait training;Stair training;Functional mobility training;Therapeutic activities;Therapeutic exercise;Balance training;Neuromuscular re-education;Patient/family education;Orthotic Fit/Training;Energy conservation    PT Next Visit Plan work on balance/strengthening,    PT Home Exercise Plan initiated- provided with instruction on how to increase adherence including to try some exercise at work    Consulted and Agree with Plan of Care Patient           Patient will benefit from skilled therapeutic intervention in order to improve the following deficits and impairments:  Abnormal gait,Decreased balance,Decreased endurance,Decreased mobility,Difficulty walking,Decreased activity tolerance,Decreased strength  Visit Diagnosis: Muscle weakness (generalized)  Unsteadiness on feet  History of falling     Problem List Patient Active Problem List   Diagnosis Date Noted  . Anemia in chronic kidney disease 05/28/2020  . Benign hypertensive kidney disease with chronic kidney disease 05/28/2020  . Proteinuria 05/28/2020  . Falls frequently 04/27/2020  . Use of cane as ambulatory aid 04/27/2020  . Numbness and tingling of both legs 02/18/2020  . Primary osteoarthritis of left knee 01/04/2020  . Diabetic retinopathy ([place]) 01/01/2020  . ASCVD  (arteriosclerotic cardiovascular disease) 10/16/2019  . Hospitalization within last 30 days 10/16/2019  . Hyperkalemia 10/14/2019  . Pseudoaneurysm of right femoral artery ([place]) 10/14/2019  . AKI (acute kidney injury) ([place]) 10/13/2019  . Unstable angina ([place]) 10/02/2019  . Chest pain 10/01/2019  . Uncontrolled type 2 diabetes mellitus with hyperglycemia, with long-term current use of insulin ([place]) 09/27/2019  . Chronic mesenteric ischemia ([place]) 05/06/2019  . Abdominal pain 05/03/2019  . Acute on chronic renal failure ([place]) 03/23/2019  . AMS (altered mental status) 12/24/2018  . Dizziness 06/30/2018  . HLD (hyperlipidemia) 02/23/2018  . Aortic atherosclerosis ([place]) 11/02/2017  . Healthcare maintenance 12/26/2016  . DM type 2 with diabetic peripheral neuropathy ([place]) 11/09/2016  . Presence of right artificial knee joint 08/14/2016  . Bilateral carotid artery disease ([place]) 07/12/2016  . 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 Acute diarrhea 05/30/2016  . Encephalopathy acute 05/30/2016  . Anemia 05/30/2016  . Acute on chronic renal insufficiency 05/30/2016  . Diarrhea 05/30/2016  . Stage 3b chronic kidney disease ([place]) 03/30/2015  . Depression, major, in remission ([place]) 08/02/2014  . Severe obesity (BMI 35.0-39.9) with comorbidity ([place]) 07/12/2014  . GERD (gastroesophageal reflux disease) 03/08/2014  . Hyperlipidemia associated with type 2 diabetes mellitus ([place]) 03/08/2014  . Drug-induced parkinsonism ([place]) 01/24/2014  . Diabetes mellitus with stage 3 chronic kidney disease ([place]) 01/21/2014  . HTN (hypertension), benign 01/21/2014  . OSA (obstructive sleep apnea) 01/21/2014  . Unspecified transient cerebral ischemia 05/29/2012   Janna Arch, PT, DPT   10/14/2020, 5:58 PM  [place] [facility] SERVICES [address] [place], [place], [address] Phone: [phone]   Fax:  [phone]  Name: Patrick Turner MRN: [id number] Date of  Birth: 18-Dec-1945

## 2020-10-15 ENCOUNTER — Other Ambulatory Visit: Payer: Self-pay

## 2020-10-15 NOTE — Patient Outreach (Signed)
[facility]) Care Management  10/15/2020  Patrick Turner [birth year] [phone]   Telephone call to patient for disease management follow up.   No answer.  HIPAA compliant voice message left.    Plan: If no return call, RN CM will attempt patient again in the month of May and send letter.  Jone Baseman, RN, MSN [place] Management Care Management Coordinator Direct Line [phone] Cell [phone] Toll Free: [phone]  Fax: [phone]

## 2020-10-19 ENCOUNTER — Ambulatory Visit: Payer: Medicare HMO

## 2020-10-19 DIAGNOSIS — F323 Major depressive disorder, single episode, severe with psychotic features: Secondary | ICD-10-CM | POA: Diagnosis not present

## 2020-10-21 ENCOUNTER — Emergency Department: Payer: Medicare HMO

## 2020-10-21 ENCOUNTER — Other Ambulatory Visit: Payer: Self-pay

## 2020-10-21 ENCOUNTER — Emergency Department
Admission: EM | Admit: 2020-10-21 | Discharge: 2020-10-21 | Disposition: A | Discharge status: Home / Self Care | Payer: Medicare HMO | Attending: Emergency Medicine | Admitting: Emergency Medicine

## 2020-10-21 ENCOUNTER — Ambulatory Visit: Payer: Medicare HMO | Admitting: Physical Therapy

## 2020-10-21 DIAGNOSIS — Z7902 Long term (current) use of antithrombotics/antiplatelets: Secondary | ICD-10-CM | POA: Diagnosis not present

## 2020-10-21 DIAGNOSIS — Z794 Long term (current) use of insulin: Secondary | ICD-10-CM | POA: Diagnosis not present

## 2020-10-21 DIAGNOSIS — M79651 Pain in right thigh: Secondary | ICD-10-CM | POA: Insufficient documentation

## 2020-10-21 DIAGNOSIS — N1832 Chronic kidney disease, stage 3b: Secondary | ICD-10-CM | POA: Insufficient documentation

## 2020-10-21 DIAGNOSIS — E113313 Type 2 diabetes mellitus with moderate nonproliferative diabetic retinopathy with macular edema, bilateral: Secondary | ICD-10-CM | POA: Diagnosis not present

## 2020-10-21 DIAGNOSIS — E11319 Type 2 diabetes mellitus with unspecified diabetic retinopathy without macular edema: Secondary | ICD-10-CM | POA: Insufficient documentation

## 2020-10-21 DIAGNOSIS — Z79899 Other long term (current) drug therapy: Secondary | ICD-10-CM | POA: Diagnosis not present

## 2020-10-21 DIAGNOSIS — E1140 Type 2 diabetes mellitus with diabetic neuropathy, unspecified: Secondary | ICD-10-CM | POA: Diagnosis not present

## 2020-10-21 DIAGNOSIS — R0789 Other chest pain: Secondary | ICD-10-CM | POA: Insufficient documentation

## 2020-10-21 DIAGNOSIS — E1165 Type 2 diabetes mellitus with hyperglycemia: Secondary | ICD-10-CM | POA: Diagnosis not present

## 2020-10-21 DIAGNOSIS — E113311 Type 2 diabetes mellitus with moderate nonproliferative diabetic retinopathy with macular edema, right eye: Secondary | ICD-10-CM | POA: Diagnosis not present

## 2020-10-21 DIAGNOSIS — E1122 Type 2 diabetes mellitus with diabetic chronic kidney disease: Secondary | ICD-10-CM | POA: Insufficient documentation

## 2020-10-21 DIAGNOSIS — G2119 Other drug induced secondary parkinsonism: Secondary | ICD-10-CM | POA: Insufficient documentation

## 2020-10-21 DIAGNOSIS — I2510 Atherosclerotic heart disease of native coronary artery without angina pectoris: Secondary | ICD-10-CM | POA: Insufficient documentation

## 2020-10-21 DIAGNOSIS — Z9181 History of falling: Secondary | ICD-10-CM

## 2020-10-21 DIAGNOSIS — Z7984 Long term (current) use of oral hypoglycemic drugs: Secondary | ICD-10-CM | POA: Insufficient documentation

## 2020-10-21 DIAGNOSIS — R2681 Unsteadiness on feet: Secondary | ICD-10-CM

## 2020-10-21 DIAGNOSIS — E113312 Type 2 diabetes mellitus with moderate nonproliferative diabetic retinopathy with macular edema, left eye: Secondary | ICD-10-CM | POA: Diagnosis not present

## 2020-10-21 DIAGNOSIS — I129 Hypertensive chronic kidney disease with stage 1 through stage 4 chronic kidney disease, or unspecified chronic kidney disease: Secondary | ICD-10-CM | POA: Diagnosis not present

## 2020-10-21 DIAGNOSIS — R079 Chest pain, unspecified: Secondary | ICD-10-CM | POA: Diagnosis not present

## 2020-10-21 DIAGNOSIS — Z96651 Presence of right artificial knee joint: Secondary | ICD-10-CM | POA: Insufficient documentation

## 2020-10-21 DIAGNOSIS — M6281 Muscle weakness (generalized): Secondary | ICD-10-CM

## 2020-10-21 LAB — BASIC METABOLIC PANEL
Anion gap: 13 (ref 5–15)
BUN: 32 mg/dL — ABNORMAL HIGH (ref 8–23)
CO2: 24 mmol/L (ref 22–32)
Calcium: 8.5 mg/dL — ABNORMAL LOW (ref 8.9–10.3)
Chloride: 100 mmol/L (ref 98–111)
Creatinine, Ser: 1.82 mg/dL — ABNORMAL HIGH (ref 0.61–1.24)
GFR, Estimated: 38 mL/min — ABNORMAL LOW (ref >60)
Glucose, Bld: 180 mg/dL — ABNORMAL HIGH (ref 70–99)
Potassium: 4.4 mmol/L (ref 3.5–5.1)
Sodium: 137 mmol/L (ref 135–145)

## 2020-10-21 LAB — CBC
HCT: 33.7 % — ABNORMAL LOW (ref 39.0–52.0)
Hemoglobin: 11 g/dL — ABNORMAL LOW (ref 13.0–17.0)
MCH: 29.9 pg (ref 26.0–34.0)
MCHC: 32.6 g/dL (ref 30.0–36.0)
MCV: 91.6 fL (ref 80.0–100.0)
Platelets: 238 10*3/uL (ref 150–400)
RBC: 3.68 MIL/uL — ABNORMAL LOW (ref 4.22–5.81)
RDW: 15.5 % (ref 11.5–15.5)
WBC: 5.9 10*3/uL (ref 4.0–10.5)
nRBC: 0 % (ref 0.0–0.2)

## 2020-10-21 LAB — TROPONIN I (HIGH SENSITIVITY)
Troponin I (High Sensitivity): 5 ng/L (ref <18)
Troponin I (High Sensitivity): 5 ng/L (ref <18)

## 2020-10-21 NOTE — Therapy (Signed)
[place] MAIN [facility] SERVICES [address] [place], [place], [address] Phone: [phone]   Fax:  [phone]  Physical Therapy Treatment  Patient Details  Name: Patrick Turner MRN: [id number] Date of Birth: 1945/11/08 Referring Provider (PT): Dr. Marry Guan   Encounter Date: 10/21/2020    Past Medical History:  Diagnosis Date  . Altered mental status   . Anemia   . Anxiety   . Aortic atherosclerosis ([place])   . Arthritis   . Coronary artery disease   . CRD (chronic renal disease)   . Depression   . Diabetes mellitus without complication ([place])   . Encephalopathy acute   . GERD (gastroesophageal reflux disease)   . Headache   . Hypercholesteremia   . Hypertension   . Neuropathy   . Severe obesity ([place])   . Sleep apnea   . Sleep terror    per patient this year per patient     Past Surgical History:  Procedure Laterality Date  . APPENDECTOMY    . CIRCUMCISION    . COLONOSCOPY    . COLONOSCOPY WITH PROPOFOL N/A 07/10/2018   Procedure: COLONOSCOPY WITH PROPOFOL;  Surgeon: Lollie Sails, MD;  Location: [facility] ENDOSCOPY;  Service: Endoscopy;  Laterality: N/A;  . COLONOSCOPY WITH PROPOFOL N/A 04/23/2019   Procedure: COLONOSCOPY WITH PROPOFOL;  Surgeon: Lollie Sails, MD;  Location: [facility] ENDOSCOPY;  Service: Endoscopy;  Laterality: N/A;  . CORONARY STENT INTERVENTION N/A 10/03/2019   Procedure: CORONARY STENT INTERVENTION;  Surgeon: Yolonda Kida, MD;  Location: [place] CV LAB;  Service: Cardiovascular;  Laterality: N/A;  . ESOPHAGOGASTRODUODENOSCOPY    . ESOPHAGOGASTRODUODENOSCOPY (EGD) WITH PROPOFOL N/A 04/23/2019   Procedure: ESOPHAGOGASTRODUODENOSCOPY (EGD) WITH PROPOFOL;  Surgeon: Lollie Sails, MD;  Location: [facility] ENDOSCOPY;  Service: Endoscopy;  Laterality: N/A;  . JOINT REPLACEMENT    . KNEE ARTHROPLASTY Right 06/27/2016   Procedure: COMPUTER ASSISTED TOTAL KNEE ARTHROPLASTY;  Surgeon: Dereck Leep, MD;   Location: [facility] ORS;  Service: Orthopedics;  Laterality: Right;  . KNEE ARTHROSCOPY Right   . LEFT HEART CATH AND CORONARY ANGIOGRAPHY N/A 10/03/2019   Procedure: Left Heart Cath and possible Coronary intervention;  Surgeon: Dionisio David, MD;  Location: [place] CV LAB;  Service: Cardiovascular;  Laterality: N/A;  . TONSILLECTOMY    . VISCERAL ANGIOGRAPHY N/A 05/23/2019   Procedure: VISCERAL ANGIOGRAPHY;  Surgeon: Algernon Huxley, MD;  Location: [place] CV LAB;  Service: Cardiovascular;  Laterality: N/A;  . VISCERAL ANGIOGRAPHY N/A 11/04/2019   Procedure: VISCERAL ANGIOGRAPHY;  Surgeon: Algernon Huxley, MD;  Location: [place] CV LAB;  Service: Cardiovascular;  Laterality: N/A;     Seated BP: 84/54 SaO2: 99% HR: 70  Seated BP: 129/60 SaO2: 97% HR: 66  Seated BP: 146/57 SaO2: 98% HR: 69   Patient ambulated with a single point cane when he arrived to outpatient clinic. He demonstrated instability and unsteadiness when ambulating from waiting room to therapy gym. Patient's vital signs were checked while sitting and the first reading was 84/54. Upon asking if he is feeling anything in his chest, patient reports of "bubbly" feeling in his L chest area and mild-moderate heaviness. BP was checked again with reading of 129/60. Cardiac rehab nurse was consulted with patient's symptoms and BP readings. BP was checked again with a reading of 146/57. Therapist and cardiac rehab nurse educated patient on importance of going to emergency room for follow up of symptoms and fluctuating BP readings with patient's  consent. Therapist reached out to patient's wife to update patient's status on admitting to the ED.     Problem List Patient Active Problem List   Diagnosis Date Noted  . Anemia in chronic kidney disease 05/28/2020  . Benign hypertensive kidney disease with chronic kidney disease 05/28/2020  . Proteinuria 05/28/2020  . Falls frequently 04/27/2020  . Use of cane as ambulatory aid  04/27/2020  . Numbness and tingling of both legs 02/18/2020  . Primary osteoarthritis of left knee 01/04/2020  . Diabetic retinopathy ([place]) 01/01/2020  . ASCVD (arteriosclerotic cardiovascular disease) 10/16/2019  . Hospitalization within last 30 days 10/16/2019  . Hyperkalemia 10/14/2019  . Pseudoaneurysm of right femoral artery ([place]) 10/14/2019  . AKI (acute kidney injury) ([place]) 10/13/2019  . Unstable angina ([place]) 10/02/2019  . Chest pain 10/01/2019  . Uncontrolled type 2 diabetes mellitus with hyperglycemia, with long-term current use of insulin ([place]) 09/27/2019  . Chronic mesenteric ischemia ([place]) 05/06/2019  . Abdominal pain 05/03/2019  . Acute on chronic renal failure ([place]) 03/23/2019  . AMS (altered mental status) 12/24/2018  . Dizziness 06/30/2018  . HLD (hyperlipidemia) 02/23/2018  . Aortic atherosclerosis ([place]) 11/02/2017  . Healthcare maintenance 12/26/2016  . DM type 2 with diabetic peripheral neuropathy ([place]) 11/09/2016  . Presence of right artificial knee joint 08/14/2016  . Bilateral carotid artery disease ([place]) 07/12/2016  . 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 Acute diarrhea 05/30/2016  . Encephalopathy acute 05/30/2016  . Anemia 05/30/2016  . Acute on chronic renal insufficiency 05/30/2016  . Diarrhea 05/30/2016  . Stage 3b chronic kidney disease ([place]) 03/30/2015  . Depression, major, in remission ([place]) 08/02/2014  . Severe obesity (BMI 35.0-39.9) with comorbidity ([place]) 07/12/2014  . GERD (gastroesophageal reflux disease) 03/08/2014  . Hyperlipidemia associated with type 2 diabetes mellitus ([place]) 03/08/2014  . Drug-induced parkinsonism ([place]) 01/24/2014  . Diabetes mellitus with stage 3 chronic kidney disease ([place]) 01/21/2014  . HTN (hypertension), benign 01/21/2014  . OSA (obstructive sleep apnea) 01/21/2014  . Unspecified transient cerebral ischemia 05/29/2012   Karl Luke PT, DPT Netta Corrigan 10/21/2020, 2:38 PM  [place] MAIN [facility] SERVICES [address] [place], [place], [address] Phone: [phone]   Fax:  [phone]  Name: Patrick Turner MRN: [id number] Date of Birth: April 03, 1946

## 2020-10-21 NOTE — ED Triage Notes (Signed)
Pt comes pov with chest pain. States "bubbles popping" on the left side of his chest with pain. Pt talking, AOx4. Pt has hx of MI a year ago.

## 2020-10-21 NOTE — ED Provider Notes (Signed)
[facility] Emergency Department Provider Note ____________________________________________   Event Date/Time   First MD Initiated Contact with Patient 10/21/20 1625     (approximate)  I have reviewed the triage vital signs and the nursing notes.  HISTORY  Chief Complaint Chest Pain   HPI Patrick Turner is a 75 y.o. malewho presents to the ED for evaluation of chest pain.   Chart review indicates history of CAD on Ranexa and aspirin/Plavix and chronic mesenteric ischemia.  Follows with Dr. Lucky Cowboy, vascular surgery.  DM. 10/03/2019 left heart cath with multivessel disease and PCI/DES x2 to mid and distal LAD by Dr. Humphrey Rolls.   Patient presents to the ED for evaluation of "bubbles popping" sensation to his left-sided chest.  He reports that he was sitting in the waiting room for cardiac rehab downstairs when he developed this bubbling sensation isolated to his left-sided chest.  Lasted a matter of minutes before self resolving, and then recurred 1 time lasting a matter of seconds before self resolving.  He denies any bubbling sensation, pain or any symptoms right now.  Reports that he feels okay.  Denies any associated emesis, dizziness, syncope, diaphoresis, shortness of breath, thoracic back or flank pain, palpitations or headache.  Further reports subacute right-sided thigh pain with ambulation, resolved with rest.  Denies falls or trauma.  Past Medical History:  Diagnosis Date  . Altered mental status   . Anemia   . Anxiety   . Aortic atherosclerosis ([place])   . Arthritis   . Coronary artery disease   . CRD (chronic renal disease)   . Depression   . Diabetes mellitus without complication ([place])   . Encephalopathy acute   . GERD (gastroesophageal reflux disease)   . Headache   . Hypercholesteremia   . Hypertension   . Neuropathy   . Severe obesity ([place])   . Sleep apnea   . Sleep terror    per patient this year per patient     Patient Active Problem  List   Diagnosis Date Noted  . Anemia in chronic kidney disease 05/28/2020  . Benign hypertensive kidney disease with chronic kidney disease 05/28/2020  . Proteinuria 05/28/2020  . Falls frequently 04/27/2020  . Use of cane as ambulatory aid 04/27/2020  . Numbness and tingling of both legs 02/18/2020  . Primary osteoarthritis of left knee 01/04/2020  . Diabetic retinopathy ([place]) 01/01/2020  . ASCVD (arteriosclerotic cardiovascular disease) 10/16/2019  . Hospitalization within last 30 days 10/16/2019  . Hyperkalemia 10/14/2019  . Pseudoaneurysm of right femoral artery ([place]) 10/14/2019  . AKI (acute kidney injury) ([place]) 10/13/2019  . Unstable angina ([place]) 10/02/2019  . Chest pain 10/01/2019  . Uncontrolled type 2 diabetes mellitus with hyperglycemia, with long-term current use of insulin ([place]) 09/27/2019  . Chronic mesenteric ischemia ([place]) 05/06/2019  . Abdominal pain 05/03/2019  . Acute on chronic renal failure ([place]) 03/23/2019  . AMS (altered mental status) 12/24/2018  . Dizziness 06/30/2018  . HLD (hyperlipidemia) 02/23/2018  . Aortic atherosclerosis ([place]) 11/02/2017  . Healthcare maintenance 12/26/2016  . DM type 2 with diabetic peripheral neuropathy ([place]) 11/09/2016  . Presence of right artificial knee joint 08/14/2016  . Bilateral carotid artery disease ([place]) 07/12/2016  . 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 Acute diarrhea 05/30/2016  . Encephalopathy acute 05/30/2016  . Anemia 05/30/2016  . Acute on chronic renal insufficiency 05/30/2016  . Diarrhea 05/30/2016  . Stage 3b chronic kidney disease ([place]) 03/30/2015  . Depression, major, in remission (  [place]) 08/02/2014  . Severe obesity (BMI 35.0-39.9) with comorbidity ([place]) 07/12/2014  . GERD (gastroesophageal reflux disease) 03/08/2014  . Hyperlipidemia associated with type 2 diabetes mellitus ([place]) 03/08/2014  . 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 Diabetes mellitus with stage 3 chronic kidney disease ([place])  01/21/2014  . HTN (hypertension), benign 01/21/2014  . OSA (obstructive sleep apnea) 01/21/2014  . Unspecified transient cerebral ischemia 05/29/2012    Past Surgical History:  Procedure Laterality Date  . APPENDECTOMY    . CIRCUMCISION    . COLONOSCOPY    . COLONOSCOPY WITH PROPOFOL N/A 07/10/2018   Procedure: COLONOSCOPY WITH PROPOFOL;  Surgeon: Lollie Sails, MD;  Location: [facility] ENDOSCOPY;  Service: Endoscopy;  Laterality: N/A;  . COLONOSCOPY WITH PROPOFOL N/A 04/23/2019   Procedure: COLONOSCOPY WITH PROPOFOL;  Surgeon: Lollie Sails, MD;  Location: [facility] ENDOSCOPY;  Service: Endoscopy;  Laterality: N/A;  . CORONARY STENT INTERVENTION N/A 10/03/2019   Procedure: CORONARY STENT INTERVENTION;  Surgeon: Yolonda Kida, MD;  Location: [place] CV LAB;  Service: Cardiovascular;  Laterality: N/A;  . ESOPHAGOGASTRODUODENOSCOPY    . ESOPHAGOGASTRODUODENOSCOPY (EGD) WITH PROPOFOL N/A 04/23/2019   Procedure: ESOPHAGOGASTRODUODENOSCOPY (EGD) WITH PROPOFOL;  Surgeon: Lollie Sails, MD;  Location: [facility] ENDOSCOPY;  Service: Endoscopy;  Laterality: N/A;  . JOINT REPLACEMENT    . 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 KNEE ARTHROSCOPY Right   . LEFT HEART CATH AND CORONARY ANGIOGRAPHY N/A 10/03/2019   Procedure: Left Heart Cath and possible Coronary intervention;  Surgeon: Dionisio David, MD;  Location: [place] CV LAB;  Service: Cardiovascular;  Laterality: N/A;  . TONSILLECTOMY    . VISCERAL ANGIOGRAPHY N/A 05/23/2019   Procedure: VISCERAL ANGIOGRAPHY;  Surgeon: Algernon Huxley, MD;  Location: [place] CV LAB;  Service: Cardiovascular;  Laterality: N/A;  . VISCERAL ANGIOGRAPHY N/A 11/04/2019   Procedure: VISCERAL ANGIOGRAPHY;  Surgeon: Algernon Huxley, MD;  Location: [place] CV LAB;  Service: Cardiovascular;  Laterality:  N/A;    Prior to Admission medications   Medication Sig Start Date End Date Taking? Authorizing Provider  ACCU-CHEK AVIVA PLUS test strip Use to test blood sugar twice daily 10/13/19   [provider]  Accu-Chek Softclix Lancets lancets Use to test blood sugar twice daily 10/13/19   [provider]  acetaminophen (TYLENOL) 325 MG tablet Take 2 tablets (650 mg total) by mouth every 6 (six) hours as needed for mild pain (or Fever >/= 101). 10/14/19   Thornell Mule, MD  ascorbic acid (VITAMIN C) 500 MG tablet Take by mouth.    [provider]  aspirin 81 MG tablet Take 1 tablet (81 mg total) by mouth daily. 02/23/18   Henreitta Leber, MD  atorvastatin (LIPITOR) 80 MG tablet Take 80 mg by mouth at bedtime. 12/14/15   [provider]  carvedilol (COREG) 6.25 MG tablet Take 6.25 mg by mouth 2 (two) times daily with a meal.    [provider]  clindamycin (CLEOCIN) 300 MG capsule Take by mouth. 01/22/20   [provider]  clonazePAM (KLONOPIN) 1 MG tablet Take 0.5 mg by mouth at bedtime as needed for anxiety.  12/18/15   [provider]  clopidogrel (PLAVIX) 75 MG tablet Take 1 tablet (75 mg total) by mouth daily. Patient not taking: No sig reported 09/09/19   Kris Hartmann, NP  gabapentin (NEURONTIN) 400 MG capsule Take 800 mg by mouth at bedtime.     [provider]  hydrALAZINE (APRESOLINE) 25 MG tablet Take 25 mg by mouth 2 (two) times daily. 01/08/20   [provider]  insulin detemir (LEVEMIR) 100 UNIT/ML injection Inject 15 Units into the skin daily. 03/04/19   [provider]  iron polysaccharides (NIFEREX) 150 MG capsule Take 150 mg by mouth daily.    [provider]  lisinopril (ZESTRIL) 10 MG tablet Take 10 mg by mouth daily.     [provider]  metFORMIN (GLUCOPHAGE-XR) 500 MG 24 hr tablet Take 500 mg by mouth 2 (two) times daily with a meal. 2 tablets twice daily with meals    [provider]  metoprolol tartrate (LOPRESSOR) 25 MG tablet Take 0.5 tablets (12.5 mg total) by mouth 2 (two) times daily. Patient taking differently: Take 25 mg by mouth at bedtime. 10/04/19   Para Skeans, MD  Omega-3 Fatty Acids (FISH OIL) 1000 MG CAPS Take 1,000 mg by mouth 2 (two) times daily.     [provider]  OZEMPIC, 0.25 OR 0.5 MG/DOSE, 2 MG/1.5ML SOPN Inject 1 Dose into the skin once a week. Wednesdays 03/04/19   [provider]  pantoprazole (PROTONIX) 40 MG tablet Take 40 mg by mouth daily.    [provider]  PARoxetine (PAXIL) 40 MG tablet Take 40 mg by mouth daily.     [provider]  pioglitazone (ACTOS) 15 MG tablet Take 15 mg by mouth daily.     [provider]  QUEtiapine (SEROQUEL) 100 MG tablet Take 100 mg by mouth at bedtime. 01/25/20   [provider]  ranolazine (RANEXA) 500 MG 12 hr tablet Take 1 tablet by mouth 2 (two) times daily. 10/07/19   [provider]  vitamin B-12 (CYANOCOBALAMIN) 1000 MCG tablet Take 1,000 mcg by mouth daily.    [provider]  Vitamin D, Ergocalciferol, (DRISDOL) 1.25 MG (50000 UNIT) CAPS capsule  03/27/20   [provider]    Allergies Penicillins and Tavist [clemastine]  Family History  Problem Relation Age of Onset  . Diabetes Mother   . Heart disease Mother   . Cancer Father   . COPD Father     Social History Social History   Tobacco Use  . Smoking status: Never Smoker  . Smokeless tobacco: Never Used  Vaping Use  . Vaping Use: Never used  Substance Use Topics  . Alcohol use: Yes    Comment: rare  . Drug use: No    Review of Systems  Constitutional: No fever/chills Eyes: No visual changes. ENT: No sore throat. Cardiovascular: Positive for chest bubbling sensation Respiratory: Denies shortness of breath. Gastrointestinal: No abdominal pain.  No nausea, no vomiting.  No diarrhea.  No constipation. Genitourinary: Negative for  dysuria. Musculoskeletal: Negative for back pain. Skin: Negative for rash. Neurological: Negative for headaches, focal weakness or numbness.  ____________________________________________   PHYSICAL EXAM:  VITAL SIGNS: Vitals:   10/21/20 1800  BP: (!) 176/67  Pulse: 64  Resp: 16  SpO2: 100%     Constitutional: Alert and oriented. Well appearing and in no acute distress. Eyes: Conjunctivae are normal. PERRL. EOMI. Head: Atraumatic. Nose: No congestion/rhinnorhea. Mouth/Throat: Mucous membranes are moist.  Oropharynx non-erythematous. Neck: No stridor. No cervical spine tenderness to palpation. Cardiovascular: Normal rate, regular rhythm. Grossly normal heart sounds.  Good peripheral circulation. Respiratory: Normal respiratory effort.  No retractions. Lungs CTAB. Gastrointestinal:  Soft , nondistended, nontender to palpation. No CVA tenderness. Musculoskeletal: No lower extremity tenderness nor edema.  No joint effusions. No signs of acute trauma. Neurologic:  Normal speech and language. No gross focal neurologic deficits are appreciated. No gait instability noted. Skin:  Skin is warm, dry and intact. No rash noted. Psychiatric: Mood and affect are normal. Speech and behavior are normal.  ____________________________________________   LABS (all labs ordered are listed, but only abnormal results are displayed)  Labs Reviewed  BASIC METABOLIC PANEL - Abnormal; Notable for the following components:      Result Value   Glucose, Bld 180 (*)    BUN 32 (*)    Creatinine, Ser 1.82 (*)    Calcium 8.5 (*)    GFR, Estimated 38 (*)    All other components within normal limits  CBC - Abnormal; Notable for the following components:   RBC 3.68 (*)    Hemoglobin 11.0 (*)    HCT 33.7 (*)    All other components within normal limits  TROPONIN I (HIGH SENSITIVITY)  TROPONIN I (HIGH SENSITIVITY)   ____________________________________________  12 Lead EKG  Sinus rhythm, rate of 68  bpm. Normal axis and intervals. No evidence of acute ischemia. Similar to previous from 03/2020 ____________________________________________  RADIOLOGY  ED MD interpretation: 2 view CXR reviewed by me without evidence of acute cardiopulmonary pathology.  Official radiology report(s): DG Chest 2 View  Result Date: 10/21/2020 CLINICAL DATA:  Chest pain with popping sensation on the left. EXAM: CHEST - 2 VIEW COMPARISON:  03/21/2020 FINDINGS: Heart size is normal. Coronary artery calcification and/or stents are present. There is aortic atherosclerotic calcification. The pulmonary vascularity is normal. The lungs are clear. No infiltrate, collapse or effusion. Ordinary degenerative changes affect the spine. IMPRESSION: No active disease. Electronically Signed   By: Nelson Chimes M.D.   On: 10/21/2020 15:41    ____________________________________________   PROCEDURES and INTERVENTIONS  Procedure(s) performed (including Critical Care):  .1-3 Lead EKG Interpretation Performed by: Vladimir Crofts, MD Authorized by: Vladimir Crofts, MD     Interpretation: normal     ECG rate:  70   ECG rate assessment: normal     Rhythm: sinus rhythm     Ectopy: none     Conduction: normal      Medications - No data to display  ____________________________________________   MDM / ED COURSE   75 year old male with known history of CAD presents to the ED with resolved bubbling sensation to his left-sided chest, without evidence of acute pathology and amenable to outpatient management.  Mild hypertension, otherwise normal vitals on room air.  Patient looks well here in the ED and has no complaints.  Reports pain/bubbling is resolved.  No evidence of distress, trauma, neurovascular deficits, skin changes or signs of herpes zoster.  CXR without cardiopulmonary pathology.  EKG is nonischemic and 2 high-sensitivity troponins are negative.  Patient continues to be symptom-free here in the ED and I see no evidence of  acute derangements to preclude outpatient management.  We discussed close follow-up with Dr. Yancey Flemings, his cardiologist, as well as Dr. Lucky Cowboy his vascular surgeon due to his subacute complaints of claudication pains to his right leg.  No resting pain or signs of neurovascular compromise here in the ED to necessitate emergent consultation.  We discussed return precautions for the ED and patient is medically stable for outpatient management.   Clinical Course as of 10/21/20 1906  Wed Oct 21, 2020  1833 Reassessed.  Patient  continues to be chest pain-free.  We discussed benign work-up without evidence of coronary ischemia or significant derangements.  We discussed CKD at baseline.  We discussed following up with Dr. Lucky Cowboy and Dr. Humphrey Rolls as an outpatient.  And we discussed return precautions for the ED.  Answered questions. [DS]    Clinical Course User Index [DS] Vladimir Crofts, MD    ____________________________________________   FINAL CLINICAL IMPRESSION(S) / ED DIAGNOSES  Final diagnoses:  Other chest pain     ED Discharge Orders    None       Dylan Tamala Julian   Note:  This document was prepared using Dragon voice recognition software and may include unintentional dictation errors.   Vladimir Crofts, MD 10/21/20 Einar Crow

## 2020-10-21 NOTE — Discharge Instructions (Signed)
Use Tylenol for pain and fevers.  Up to 1000 mg per dose, up to 4 times per day.  Do not take more than 4000 mg of Tylenol/acetaminophen within 24 hours..  If you develop any worsening symptoms, chest pain with fever, chest pain with passing out or throwing up, please return to the ED.

## 2020-10-22 ENCOUNTER — Telehealth (INDEPENDENT_AMBULATORY_CARE_PROVIDER_SITE_OTHER): Payer: Self-pay | Admitting: Vascular Surgery

## 2020-10-22 DIAGNOSIS — Z9861 Coronary angioplasty status: Secondary | ICD-10-CM | POA: Diagnosis not present

## 2020-10-22 DIAGNOSIS — I209 Angina pectoris, unspecified: Secondary | ICD-10-CM | POA: Diagnosis not present

## 2020-10-22 DIAGNOSIS — I10 Essential (primary) hypertension: Secondary | ICD-10-CM | POA: Diagnosis not present

## 2020-10-22 DIAGNOSIS — I2510 Atherosclerotic heart disease of native coronary artery without angina pectoris: Secondary | ICD-10-CM | POA: Diagnosis not present

## 2020-10-22 DIAGNOSIS — E782 Mixed hyperlipidemia: Secondary | ICD-10-CM | POA: Diagnosis not present

## 2020-10-22 NOTE — Telephone Encounter (Signed)
Please bring him in with ABI's and visit please. This is not stat. Thank you

## 2020-10-23 ENCOUNTER — Encounter: Payer: Self-pay | Admitting: Emergency Medicine

## 2020-10-23 ENCOUNTER — Other Ambulatory Visit: Payer: Self-pay

## 2020-10-23 ENCOUNTER — Emergency Department: Payer: Medicare HMO

## 2020-10-23 ENCOUNTER — Emergency Department
Admission: EM | Admit: 2020-10-23 | Discharge: 2020-10-23 | Disposition: A | Discharge status: Home / Self Care | Payer: Medicare HMO | Attending: Student in an Organized Health Care Education/Training Program | Admitting: Student in an Organized Health Care Education/Training Program

## 2020-10-23 DIAGNOSIS — I2510 Atherosclerotic heart disease of native coronary artery without angina pectoris: Secondary | ICD-10-CM | POA: Diagnosis not present

## 2020-10-23 DIAGNOSIS — E1122 Type 2 diabetes mellitus with diabetic chronic kidney disease: Secondary | ICD-10-CM | POA: Insufficient documentation

## 2020-10-23 DIAGNOSIS — Z7902 Long term (current) use of antithrombotics/antiplatelets: Secondary | ICD-10-CM | POA: Diagnosis not present

## 2020-10-23 DIAGNOSIS — R4182 Altered mental status, unspecified: Secondary | ICD-10-CM | POA: Diagnosis not present

## 2020-10-23 DIAGNOSIS — Z7984 Long term (current) use of oral hypoglycemic drugs: Secondary | ICD-10-CM | POA: Diagnosis not present

## 2020-10-23 DIAGNOSIS — I129 Hypertensive chronic kidney disease with stage 1 through stage 4 chronic kidney disease, or unspecified chronic kidney disease: Secondary | ICD-10-CM | POA: Diagnosis not present

## 2020-10-23 DIAGNOSIS — G2119 Other drug induced secondary parkinsonism: Secondary | ICD-10-CM | POA: Diagnosis not present

## 2020-10-23 DIAGNOSIS — Z7982 Long term (current) use of aspirin: Secondary | ICD-10-CM | POA: Insufficient documentation

## 2020-10-23 DIAGNOSIS — M79651 Pain in right thigh: Secondary | ICD-10-CM | POA: Diagnosis not present

## 2020-10-23 DIAGNOSIS — R0602 Shortness of breath: Secondary | ICD-10-CM | POA: Insufficient documentation

## 2020-10-23 DIAGNOSIS — E113599 Type 2 diabetes mellitus with proliferative diabetic retinopathy without macular edema, unspecified eye: Secondary | ICD-10-CM | POA: Diagnosis not present

## 2020-10-23 DIAGNOSIS — E860 Dehydration: Secondary | ICD-10-CM | POA: Insufficient documentation

## 2020-10-23 DIAGNOSIS — I701 Atherosclerosis of renal artery: Secondary | ICD-10-CM | POA: Diagnosis not present

## 2020-10-23 DIAGNOSIS — I774 Celiac artery compression syndrome: Secondary | ICD-10-CM | POA: Diagnosis not present

## 2020-10-23 DIAGNOSIS — E1165 Type 2 diabetes mellitus with hyperglycemia: Secondary | ICD-10-CM | POA: Diagnosis not present

## 2020-10-23 DIAGNOSIS — R0789 Other chest pain: Secondary | ICD-10-CM | POA: Diagnosis not present

## 2020-10-23 DIAGNOSIS — E119 Type 2 diabetes mellitus without complications: Secondary | ICD-10-CM | POA: Insufficient documentation

## 2020-10-23 DIAGNOSIS — Z96651 Presence of right artificial knee joint: Secondary | ICD-10-CM | POA: Insufficient documentation

## 2020-10-23 DIAGNOSIS — Z79899 Other long term (current) drug therapy: Secondary | ICD-10-CM | POA: Diagnosis not present

## 2020-10-23 DIAGNOSIS — E1142 Type 2 diabetes mellitus with diabetic polyneuropathy: Secondary | ICD-10-CM | POA: Insufficient documentation

## 2020-10-23 DIAGNOSIS — I700 Atherosclerosis of aorta: Secondary | ICD-10-CM | POA: Diagnosis not present

## 2020-10-23 DIAGNOSIS — Z794 Long term (current) use of insulin: Secondary | ICD-10-CM | POA: Insufficient documentation

## 2020-10-23 DIAGNOSIS — N1832 Chronic kidney disease, stage 3b: Secondary | ICD-10-CM | POA: Insufficient documentation

## 2020-10-23 DIAGNOSIS — R079 Chest pain, unspecified: Secondary | ICD-10-CM | POA: Diagnosis not present

## 2020-10-23 DIAGNOSIS — R404 Transient alteration of awareness: Secondary | ICD-10-CM | POA: Diagnosis not present

## 2020-10-23 DIAGNOSIS — N3289 Other specified disorders of bladder: Secondary | ICD-10-CM | POA: Diagnosis not present

## 2020-10-23 LAB — URINALYSIS, COMPLETE (UACMP) WITH MICROSCOPIC
Bacteria, UA: NONE SEEN
Bilirubin Urine: NEGATIVE
Glucose, UA: NEGATIVE mg/dL
Hgb urine dipstick: NEGATIVE
Ketones, ur: NEGATIVE mg/dL
Leukocytes,Ua: NEGATIVE
Nitrite: NEGATIVE
Protein, ur: NEGATIVE mg/dL
Specific Gravity, Urine: 1.013 (ref 1.005–1.030)
Squamous Epithelial / HPF: NONE SEEN (ref 0–5)
pH: 5 (ref 5.0–8.0)

## 2020-10-23 LAB — CBC WITH DIFFERENTIAL/PLATELET
Abs Immature Granulocytes: 0.02 10*3/uL (ref 0.00–0.07)
Basophils Absolute: 0 10*3/uL (ref 0.0–0.1)
Basophils Relative: 1 %
Eosinophils Absolute: 0.3 10*3/uL (ref 0.0–0.5)
Eosinophils Relative: 5 %
HCT: 34 % — ABNORMAL LOW (ref 39.0–52.0)
Hemoglobin: 11 g/dL — ABNORMAL LOW (ref 13.0–17.0)
Immature Granulocytes: 0 %
Lymphocytes Relative: 19 %
Lymphs Abs: 1.1 10*3/uL (ref 0.7–4.0)
MCH: 29.7 pg (ref 26.0–34.0)
MCHC: 32.4 g/dL (ref 30.0–36.0)
MCV: 91.9 fL (ref 80.0–100.0)
Monocytes Absolute: 0.6 10*3/uL (ref 0.1–1.0)
Monocytes Relative: 10 %
Neutro Abs: 3.6 10*3/uL (ref 1.7–7.7)
Neutrophils Relative %: 65 %
Platelets: 242 10*3/uL (ref 150–400)
RBC: 3.7 MIL/uL — ABNORMAL LOW (ref 4.22–5.81)
RDW: 15.5 % (ref 11.5–15.5)
WBC: 5.5 10*3/uL (ref 4.0–10.5)
nRBC: 0 % (ref 0.0–0.2)

## 2020-10-23 LAB — COMPREHENSIVE METABOLIC PANEL
ALT: 10 U/L (ref 0–44)
AST: 19 U/L (ref 15–41)
Albumin: 4 g/dL (ref 3.5–5.0)
Alkaline Phosphatase: 69 U/L (ref 38–126)
Anion gap: 11 (ref 5–15)
BUN: 46 mg/dL — ABNORMAL HIGH (ref 8–23)
CO2: 26 mmol/L (ref 22–32)
Calcium: 8.6 mg/dL — ABNORMAL LOW (ref 8.9–10.3)
Chloride: 100 mmol/L (ref 98–111)
Creatinine, Ser: 2.36 mg/dL — ABNORMAL HIGH (ref 0.61–1.24)
GFR, Estimated: 28 mL/min — ABNORMAL LOW (ref >60)
Glucose, Bld: 254 mg/dL — ABNORMAL HIGH (ref 70–99)
Potassium: 5.3 mmol/L — ABNORMAL HIGH (ref 3.5–5.1)
Sodium: 137 mmol/L (ref 135–145)
Total Bilirubin: 0.7 mg/dL (ref 0.3–1.2)
Total Protein: 6.7 g/dL (ref 6.5–8.1)

## 2020-10-23 LAB — BASIC METABOLIC PANEL
Anion gap: 9 (ref 5–15)
BUN: 46 mg/dL — ABNORMAL HIGH (ref 8–23)
CO2: 25 mmol/L (ref 22–32)
Calcium: 7.9 mg/dL — ABNORMAL LOW (ref 8.9–10.3)
Chloride: 104 mmol/L (ref 98–111)
Creatinine, Ser: 2.16 mg/dL — ABNORMAL HIGH (ref 0.61–1.24)
GFR, Estimated: 31 mL/min — ABNORMAL LOW (ref >60)
Glucose, Bld: 246 mg/dL — ABNORMAL HIGH (ref 70–99)
Potassium: 4.7 mmol/L (ref 3.5–5.1)
Sodium: 138 mmol/L (ref 135–145)

## 2020-10-23 LAB — LIPASE, BLOOD: Lipase: 27 U/L (ref 11–51)

## 2020-10-23 LAB — TROPONIN I (HIGH SENSITIVITY)
Troponin I (High Sensitivity): 5 ng/L (ref <18)
Troponin I (High Sensitivity): 4 ng/L (ref <18)

## 2020-10-23 MED ORDER — SODIUM ZIRCONIUM CYCLOSILICATE 10 G PO PACK: 10.0000 g | PACK | Freq: Every day | ORAL | Status: DC

## 2020-10-23 MED ORDER — IOHEXOL 350 MG/ML SOLN
75.0000 mL | Freq: Once | INTRAVENOUS | Status: AC | PRN
  Administered 2020-10-23: 75 mL via INTRAVENOUS

## 2020-10-23 MED ORDER — SODIUM CHLORIDE 0.9 % IV BOLUS
500.0000 mL | Freq: Once | INTRAVENOUS | Status: AC
  Administered 2020-10-23: 500 mL via INTRAVENOUS

## 2020-10-23 MED ORDER — HYDROCODONE-ACETAMINOPHEN 5-325 MG PO TABS
1.0000 | ORAL_TABLET | Freq: Once | ORAL | Status: AC
  Administered 2020-10-23: 1 via ORAL
  Filled 2020-10-23: qty 1

## 2020-10-23 NOTE — ED Notes (Signed)
Patient to CT at this time

## 2020-10-23 NOTE — ED Provider Notes (Signed)
[facility] Emergency Department Provider Note    Event Date/Time   First MD Initiated Contact with Patient 10/23/20 1529     (approximate)  I have reviewed the triage vital signs and the nursing notes.   HISTORY  Chief Complaint Chest Pain    HPI Patrick Turner is a 75 y.o. 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  Did feel some shortness of breath but is since passed.  Now complaining of right thigh burning pain which has been similar to previous episodes that he has been dealing with for the past several weeks.  No measured fevers.  Denies any cough or congestion.    Past Medical History:  Diagnosis Date  . Altered mental status   . Anemia   . Anxiety   . Aortic atherosclerosis ([place])   . Arthritis   . Coronary artery disease   . CRD (chronic renal disease)   . Depression   . Diabetes mellitus without complication ([place])   . Encephalopathy acute   . GERD (gastroesophageal reflux disease)   . Headache   . Hypercholesteremia   . Hypertension   . Neuropathy   . Severe obesity ([place])   . Sleep apnea   . Sleep terror    per patient this year per patient    Family History  Problem Relation Age of Onset  . Diabetes Mother   . Heart disease Mother   . Cancer Father   . COPD Father    Past Surgical History:  Procedure Laterality Date  . APPENDECTOMY    . CIRCUMCISION    . COLONOSCOPY    . COLONOSCOPY WITH PROPOFOL N/A 07/10/2018   Procedure: COLONOSCOPY WITH PROPOFOL;  Surgeon: Lollie Sails, MD;  Location: [facility] ENDOSCOPY;  Service: Endoscopy;  Laterality: N/A;  . COLONOSCOPY WITH PROPOFOL N/A 04/23/2019   Procedure: COLONOSCOPY WITH PROPOFOL;  Surgeon: Lollie Sails, MD;  Location: [facility] ENDOSCOPY;  Service: Endoscopy;  Laterality: N/A;  . CORONARY STENT INTERVENTION N/A 10/03/2019    Procedure: CORONARY STENT INTERVENTION;  Surgeon: Yolonda Kida, MD;  Location: [place] CV LAB;  Service: Cardiovascular;  Laterality: N/A;  . ESOPHAGOGASTRODUODENOSCOPY    . ESOPHAGOGASTRODUODENOSCOPY (EGD) WITH PROPOFOL N/A 04/23/2019   Procedure: ESOPHAGOGASTRODUODENOSCOPY (EGD) WITH PROPOFOL;  Surgeon: Lollie Sails, MD;  Location: [facility] ENDOSCOPY;  Service: Endoscopy;  Laterality: N/A;  . JOINT REPLACEMENT    . 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 KNEE ARTHROSCOPY Right   . LEFT HEART CATH AND CORONARY ANGIOGRAPHY N/A 10/03/2019   Procedure: Left Heart Cath and possible Coronary intervention;  Surgeon: Dionisio David, MD;  Location: [place] [facility];  Service: Cardiovascular;  Laterality: N/A;  . TONSILLECTOMY    . VISCERAL ANGIOGRAPHY N/A 05/23/2019   Procedure: VISCERAL ANGIOGRAPHY;  Surgeon: Algernon Huxley, MD;  Location: [place] [facility];  Service: Cardiovascular;  Laterality: N/A;  . VISCERAL ANGIOGRAPHY N/A 11/04/2019   Procedure: VISCERAL ANGIOGRAPHY;  Surgeon: Algernon Huxley, MD;  Location: [place] [facility];  Service: Cardiovascular;  Laterality: N/A;   Patient Active Problem List   Diagnosis Date Noted  . Anemia in chronic kidney disease 05/28/2020  . Benign hypertensive kidney disease with chronic kidney  disease 05/28/2020  . Proteinuria 05/28/2020  . Falls frequently 04/27/2020  . Use of cane as ambulatory aid 04/27/2020  . Numbness and tingling of both legs 02/18/2020  . Primary osteoarthritis of left knee 01/04/2020  . Diabetic retinopathy ([place]) 01/01/2020  . ASCVD (arteriosclerotic cardiovascular disease) 10/16/2019  . Hospitalization within last 30 days 10/16/2019  . Hyperkalemia 10/14/2019  . Pseudoaneurysm of right femoral artery ([place]) 10/14/2019  . AKI (acute kidney injury) ([place]) 10/13/2019  .  Unstable angina ([place]) 10/02/2019  . Chest pain 10/01/2019  . Uncontrolled type 2 diabetes mellitus with hyperglycemia, with long-term current use of insulin ([place]) 09/27/2019  . Chronic mesenteric ischemia ([place]) 05/06/2019  . Abdominal pain 05/03/2019  . Acute on chronic renal failure ([place]) 03/23/2019  . AMS (altered mental status) 12/24/2018  . Dizziness 06/30/2018  . HLD (hyperlipidemia) 02/23/2018  . Aortic atherosclerosis ([place]) 11/02/2017  . Healthcare maintenance 12/26/2016  . DM type 2 with diabetic peripheral neuropathy ([place]) 11/09/2016  . Presence of right artificial knee joint 08/14/2016  . Bilateral carotid artery disease ([place]) 07/12/2016  . 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 Acute diarrhea 05/30/2016  . Encephalopathy acute 05/30/2016  . Anemia 05/30/2016  . Acute on chronic renal insufficiency 05/30/2016  . Diarrhea 05/30/2016  . Stage 3b chronic kidney disease ([place]) 03/30/2015  . Depression, major, in remission ([place]) 08/02/2014  . Severe obesity (BMI 35.0-39.9) with comorbidity ([place]) 07/12/2014  . GERD (gastroesophageal reflux disease) 03/08/2014  . Hyperlipidemia associated with type 2 diabetes mellitus ([place]) 03/08/2014  . Drug-induced parkinsonism ([place]) 01/24/2014  . Diabetes mellitus with stage 3 chronic kidney disease ([place]) 01/21/2014  . HTN (hypertension), benign 01/21/2014  . OSA (obstructive sleep apnea) 01/21/2014  . Unspecified transient cerebral ischemia 05/29/2012      Prior to Admission medications   Medication Sig Start Date End Date Taking? Authorizing Provider  ACCU-CHEK AVIVA PLUS test strip Use to test blood sugar twice daily 10/13/19   [provider]  Accu-Chek Softclix Lancets lancets Use to test blood sugar twice daily 10/13/19   [provider]  acetaminophen (TYLENOL) 325 MG tablet Take 2 tablets (650 mg total) by mouth every 6 (six) hours as needed for mild pain (or Fever >/= 101). 10/14/19   Thornell Mule, MD  ascorbic  acid (VITAMIN C) 500 MG tablet Take by mouth.    [provider]  aspirin 81 MG tablet Take 1 tablet (81 mg total) by mouth daily. 02/23/18   Henreitta Leber, MD  atorvastatin (LIPITOR) 80 MG tablet Take 80 mg by mouth at bedtime. 12/14/15   [provider]  carvedilol (COREG) 6.25 MG tablet Take 6.25 mg by mouth 2 (two) times daily with a meal.    [provider]  clindamycin (CLEOCIN) 300 MG capsule Take by mouth. 01/22/20   [provider]  clonazePAM (KLONOPIN) 1 MG tablet Take 0.5 mg by mouth at bedtime as needed for anxiety.  12/18/15   [provider]  clopidogrel (PLAVIX) 75 MG tablet Take 1 tablet (75 mg total) by mouth daily. Patient not taking: No sig reported 09/09/19   Kris Hartmann, NP  gabapentin (NEURONTIN) 400 MG capsule Take 800 mg by mouth at bedtime.     [provider]  hydrALAZINE (APRESOLINE) 25 MG tablet Take 25 mg by mouth 2 (two) times daily. 01/08/20   [provider]  insulin detemir (LEVEMIR) 100 UNIT/ML injection Inject 15 Units into the skin daily. 03/04/19   [provider]  iron polysaccharides (NIFEREX) 150 MG capsule Take 150 mg by mouth daily.    [provider]  lisinopril (ZESTRIL) 10 MG tablet Take 10 mg by mouth daily.     [provider]  metFORMIN (GLUCOPHAGE-XR) 500 MG 24 hr tablet Take 500 mg by mouth 2 (two) times daily with a meal. 2 tablets twice daily with meals    [provider]  metoprolol tartrate (LOPRESSOR) 25 MG tablet Take 0.5 tablets (12.5 mg total) by mouth 2 (two) times daily. Patient taking differently: Take 25 mg by mouth at bedtime. 10/04/19   Para Skeans, MD  Omega-3 Fatty Acids (FISH OIL) 1000 MG CAPS Take 1,000 mg by mouth 2 (two) times daily.     [provider]  OZEMPIC, 0.25 OR 0.5 MG/DOSE, 2 MG/1.5ML SOPN Inject 1 Dose into the skin once a week. Wednesdays 03/04/19   [provider]  pantoprazole (PROTONIX) 40 MG tablet  Take 40 mg by mouth daily.    [provider]  PARoxetine (PAXIL) 40 MG tablet Take 40 mg by mouth daily.     [provider]  pioglitazone (ACTOS) 15 MG tablet Take 15 mg by mouth daily.     [provider]  QUEtiapine (SEROQUEL) 100 MG tablet Take 100 mg by mouth at bedtime. 01/25/20   [provider]  ranolazine (RANEXA) 500 MG 12 hr tablet Take 1 tablet by mouth 2 (two) times daily. 10/07/19   [provider]  vitamin B-12 (CYANOCOBALAMIN) 1000 MCG tablet Take 1,000 mcg by mouth daily.    [provider]  Vitamin D, Ergocalciferol, (DRISDOL) 1.25 MG (50000 UNIT) CAPS capsule  03/27/20   [provider]    Allergies Penicillins and Tavist [clemastine]    Social History Social History   Tobacco Use  . Smoking status: Never Smoker  . Smokeless tobacco: Never Used  Vaping Use  . Vaping Use: Never used  Substance Use Topics  . Alcohol use: Yes    Comment: rare  . Drug use: No    Review of Systems Patient denies headaches, rhinorrhea, blurry vision, numbness, shortness of breath, chest pain, edema, cough, abdominal pain, nausea, vomiting, diarrhea, dysuria, fevers, rashes or hallucinations unless otherwise stated above in HPI. ____________________________________________   PHYSICAL EXAM:  VITAL SIGNS: Vitals:   10/23/20 1900 10/23/20 1915  BP: (!) 145/58 (!) 142/53  Pulse: 62 61  Resp: 19 15  Temp:    SpO2: 100% 99%    Constitutional: Alert and oriented.  Eyes: Conjunctivae are normal.  Head: Atraumatic. Nose: No congestion/rhinnorhea. Mouth/Throat: Mucous membranes are moist.   Neck: No stridor. Painless ROM.  Cardiovascular: Normal rate, regular rhythm. Grossly normal heart sounds.  Good peripheral circulation. Respiratory: Normal respiratory effort.  No retractions. Lungs CTAB. Gastrointestinal: Soft and nontender. No distention. No abdominal bruits. No CVA tenderness. Genitourinary:  Musculoskeletal:  No lower extremity tenderness nor edema.  No joint effusions. Neurologic:  Normal speech and language. No gross focal neurologic deficits are appreciated. No facial droop Skin:  Skin is warm, dry and intact. No rash noted. Psychiatric: Mood and affect are normal. Speech and behavior are normal.  ____________________________________________   LABS (all labs ordered are listed, but only abnormal results are displayed)  Results for orders placed or performed during the hospital encounter of 10/23/20 (from the past 24 hour(s))  CBC with Differential     Status: Abnormal   Collection Time: 10/23/20  3:39 PM  Result Value Ref Range   WBC  5.5 4.0 - 10.5 K/uL   RBC 3.70 (L) 4.22 - 5.81 MIL/uL   Hemoglobin 11.0 (L) 13.0 - 17.0 g/dL   HCT 34.0 (L) 39.0 - 52.0 %   MCV 91.9 80.0 - 100.0 fL   MCH 29.7 26.0 - 34.0 pg   MCHC 32.4 30.0 - 36.0 g/dL   RDW 15.5 11.5 - 15.5 %   Platelets 242 150 - 400 K/uL   nRBC 0.0 0.0 - 0.2 %   Neutrophils Relative % 65 %   Neutro Abs 3.6 1.7 - 7.7 K/uL   Lymphocytes Relative 19 %   Lymphs Abs 1.1 0.7 - 4.0 K/uL   Monocytes Relative 10 %   Monocytes Absolute 0.6 0.1 - 1.0 K/uL   Eosinophils Relative 5 %   Eosinophils Absolute 0.3 0.0 - 0.5 K/uL   Basophils Relative 1 %   Basophils Absolute 0.0 0.0 - 0.1 K/uL   Immature Granulocytes 0 %   Abs Immature Granulocytes 0.02 0.00 - 0.07 K/uL  Comprehensive metabolic panel     Status: Abnormal   Collection Time: 10/23/20  3:39 PM  Result Value Ref Range   Sodium 137 135 - 145 mmol/L   Potassium 5.3 (H) 3.5 - 5.1 mmol/L   Chloride 100 98 - 111 mmol/L   CO2 26 22 - 32 mmol/L   Glucose, Bld 254 (H) 70 - 99 mg/dL   BUN 46 (H) 8 - 23 mg/dL   Creatinine, Ser 2.36 (H) 0.61 - 1.24 mg/dL   Calcium 8.6 (L) 8.9 - 10.3 mg/dL   Total Protein 6.7 6.5 - 8.1 g/dL   Albumin 4.0 3.5 - 5.0 g/dL   AST 19 15 - 41 U/L   ALT 10 0 - 44 U/L   Alkaline Phosphatase 69 38 - 126 U/L   Total Bilirubin 0.7 0.3 - 1.2 mg/dL   GFR,  Estimated 28 (L) >60 mL/min   Anion gap 11 5 - 15  Troponin I (High Sensitivity)     Status: None   Collection Time: 10/23/20  3:39 PM  Result Value Ref Range   Troponin I (High Sensitivity) 5 <18 ng/L  Lipase, blood     Status: None   Collection Time: 10/23/20  3:39 PM  Result Value Ref Range   Lipase 27 11 - 51 U/L  Basic metabolic panel     Status: Abnormal   Collection Time: 10/23/20  4:32 PM  Result Value Ref Range   Sodium 138 135 - 145 mmol/L   Potassium 4.7 3.5 - 5.1 mmol/L   Chloride 104 98 - 111 mmol/L   CO2 25 22 - 32 mmol/L   Glucose, Bld 246 (H) 70 - 99 mg/dL   BUN 46 (H) 8 - 23 mg/dL   Creatinine, Ser 2.16 (H) 0.61 - 1.24 mg/dL   Calcium 7.9 (L) 8.9 - 10.3 mg/dL   GFR, Estimated 31 (L) >60 mL/min   Anion gap 9 5 - 15  Troponin I (High Sensitivity)     Status: None   Collection Time: 10/23/20  5:20 PM  Result Value Ref Range   Troponin I (High Sensitivity) 4 <18 ng/L  Urinalysis, Complete w Microscopic     Status: Abnormal   Collection Time: 10/23/20  6:29 PM  Result Value Ref Range   Color, Urine YELLOW (A) YELLOW   APPearance CLEAR (A) CLEAR   Specific Gravity, Urine 1.013 1.005 - 1.030   pH 5.0 5.0 - 8.0   Glucose, UA NEGATIVE NEGATIVE mg/dL  Hgb urine dipstick NEGATIVE NEGATIVE   Bilirubin Urine NEGATIVE NEGATIVE   Ketones, ur NEGATIVE NEGATIVE mg/dL   Protein, ur NEGATIVE NEGATIVE mg/dL   Nitrite NEGATIVE NEGATIVE   Leukocytes,Ua NEGATIVE NEGATIVE   RBC / HPF 0-5 0 - 5 RBC/hpf   WBC, UA 0-5 0 - 5 WBC/hpf   Bacteria, UA NONE SEEN NONE SEEN   Squamous Epithelial / LPF NONE SEEN 0 - 5   Hyaline Casts, UA PRESENT    ____________________________________________  EKG My review and personal interpretation at Time: 15:28   Indication: chest pain  Rate: 60  Rhythm: sinus Axis: normal Other: normal intervals, no stemi ____________________________________________  RADIOLOGY  I personally reviewed all radiographic images ordered to evaluate for the  above acute complaints and reviewed radiology reports and findings.  These findings were personally discussed with the patient.  Please see medical record for radiology report.  ____________________________________________   PROCEDURES  Procedure(s) performed:  Procedures    Critical Care performed: no ____________________________________________   INITIAL IMPRESSION / ASSESSMENT AND PLAN / ED COURSE  Pertinent labs & imaging results that were available during my care of the patient were reviewed by me and considered in my medical decision making (see chart for details).   DDX: ACS, pericarditis, esophagitis, boerhaaves, pe, dissection, pna, bronchitis, costochondritis   SAEED TOREN is a 75 y.o. who presents to the ED with presentation as described above.  Patient nontoxic and is pain-free at this time.  Very vague description of symptoms.  Brief in nature.  EKG is nonischemic.  Do note that he is got pretty significant mesenteric arterial disease and is creatinine did bump in the past 2 days.  He denies any flank pain or back pain at this moment but was having some flank discomfort this were think that CTA will be indicated to rule out dissection will give fluids as he is just on the cusp of his GFR for CTA at will repeat and if stable will CTA.  Initial troponin negative.  His abdominal exam otherwise soft and benign.  Clinical Course as of 10/23/20 1938  Patrick Turner Oct 23, 2020  1917 Patient feels well at this time.  No sign of UTI.  Renal function improving with IV hydration.  Question whether the patient simply did not get as much fluid throughout the day.  His troponins are negative.  EKG is nonischemic.  This does not seem consistent with ACS as his pain was brief and atypical with negative enzymes.  States he was also feeling lightheaded and dizziness were also have a high suspicion for some dehydration possible orthostasis causing his presentation.  Nevertheless he appears well at  this time.  I do believe he stable and appropriate for further work-up as an outpatient. [PR]    Clinical Course User Index [PR] Merlyn Lot, MD    The patient was evaluated in Emergency Department today for the symptoms described in the history of present illness. He/she was evaluated in the context of the global COVID-19 pandemic, which necessitated consideration that the patient might be at risk for infection with the SARS-CoV-2 virus that causes COVID-19. Institutional protocols and algorithms that pertain to the evaluation of patients at risk for COVID-19 are in a state of rapid change based on information released by regulatory bodies including the CDC and federal and state organizations. These policies and algorithms were followed during the patient's care in the ED.  As part of my medical decision making, I reviewed the following data within  the electronic MEDICAL RECORD NUMBER Nursing notes reviewed and incorporated, Labs reviewed, notes from prior ED visits and [place] Controlled Substance Database   ____________________________________________   FINAL CLINICAL IMPRESSION(S) / ED DIAGNOSES  Final diagnoses:  Dehydration  Atypical chest pain      NEW MEDICATIONS STARTED DURING THIS VISIT:  New Prescriptions   No medications on file     Note:  This document was prepared using Dragon voice recognition software and may include unintentional dictation errors.    Merlyn Lot, MD 10/23/20 [phone]

## 2020-10-23 NOTE — Discharge Instructions (Signed)
Be sure to drink plenty of fluids for the next few days.  Take your medications as prescribed.  Return for any n ew symptoms, questions or concerns.

## 2020-10-23 NOTE — ED Notes (Signed)
Patient given water to drink and urinal for urine sample.

## 2020-10-23 NOTE — ED Triage Notes (Signed)
Patient arrives via EMS for left sided chest pain. Patient was seen here yesterday for the same but states that the pain is worse today. Patient answering questions appropriately at this time.

## 2020-10-26 ENCOUNTER — Telehealth: Payer: Self-pay

## 2020-10-26 ENCOUNTER — Other Ambulatory Visit: Payer: Self-pay

## 2020-10-26 ENCOUNTER — Ambulatory Visit: Payer: Medicare HMO

## 2020-10-26 DIAGNOSIS — Z9181 History of falling: Secondary | ICD-10-CM | POA: Diagnosis not present

## 2020-10-26 DIAGNOSIS — M6281 Muscle weakness (generalized): Secondary | ICD-10-CM

## 2020-10-26 DIAGNOSIS — R2681 Unsteadiness on feet: Secondary | ICD-10-CM | POA: Diagnosis not present

## 2020-10-26 NOTE — Telephone Encounter (Signed)
Pt having orthostatic episode in OPPT this morning, sounds consistent in presentation with issues yesterday at work. I contacted nephology as they recently added diuretic to medication regimen, as well as DC one of two beta blockers. Dr. Raliegh Ip reports his office will contact patient in near future.   1:17 PM, 10/26/20 Etta Grandchild, PT, DPT Physical Therapist - [place] [phone]

## 2020-10-26 NOTE — Telephone Encounter (Signed)
Author called pt to make sure patient got home safe after OPPT today. Son helped with transport home from appointment today. Pt reports he is feeling fine, has since spoken to nephrology about his medications. He is on-hold with another doctor during our call attempting to schedule another medical appointment.   3:18 PM, 10/26/20 Etta Grandchild, PT, DPT Physical Therapist - [place] [phone]

## 2020-10-26 NOTE — Therapy (Signed)
[place] MAIN [facility] SERVICES [address] [place], [place], [address] Phone: [phone]   Fax:  [phone]  Physical Therapy Treatment  Patient Details  Name: Patrick Turner MRN: [id number] Date of Birth: 1946/05/08 Referring Provider (PT): Dr. Marry Guan   Encounter Date: 10/26/2020   PT End of Session - 10/26/20 1047    Visit Number 9    Number of Visits 16    Date for PT Re-Evaluation 11/11/20    Authorization Type [employer], $40 copay    Authorization Time Period 1/31-11/13/20 9 PT visits    PT Start Time 1028    PT Stop Time 1055    PT Time Calculation (min) 27 min    Equipment Utilized During Treatment Gait belt    Activity Tolerance Patient tolerated treatment well;No increased pain;Patient limited by fatigue    Behavior During Therapy [facility] for tasks assessed/performed           Past Medical History:  Diagnosis Date  . Altered mental status   . Anemia   . Anxiety   . Aortic atherosclerosis ([place])   . Arthritis   . Coronary artery disease   . CRD (chronic renal disease)   . Depression   . Diabetes mellitus without complication ([place])   . Encephalopathy acute   . GERD (gastroesophageal reflux disease)   . Headache   . Hypercholesteremia   . Hypertension   . Neuropathy   . Severe obesity ([place])   . Sleep apnea   . Sleep terror    per patient this year per patient     Past Surgical History:  Procedure Laterality Date  . APPENDECTOMY    . CIRCUMCISION    . COLONOSCOPY    . COLONOSCOPY WITH PROPOFOL N/A 07/10/2018   Procedure: COLONOSCOPY WITH PROPOFOL;  Surgeon: Lollie Sails, MD;  Location: [facility] ENDOSCOPY;  Service: Endoscopy;  Laterality: N/A;  . COLONOSCOPY WITH PROPOFOL N/A 04/23/2019   Procedure: COLONOSCOPY WITH PROPOFOL;  Surgeon: Lollie Sails, MD;  Location: [facility] ENDOSCOPY;  Service: Endoscopy;  Laterality: N/A;  . CORONARY STENT INTERVENTION N/A 10/03/2019   Procedure: CORONARY STENT INTERVENTION;   Surgeon: Yolonda Kida, MD;  Location: [place] CV LAB;  Service: Cardiovascular;  Laterality: N/A;  . ESOPHAGOGASTRODUODENOSCOPY    . ESOPHAGOGASTRODUODENOSCOPY (EGD) WITH PROPOFOL N/A 04/23/2019   Procedure: ESOPHAGOGASTRODUODENOSCOPY (EGD) WITH PROPOFOL;  Surgeon: Lollie Sails, MD;  Location: [place] ENDOSCOPY;  Service: Endoscopy;  Laterality: N/A;  . JOINT REPLACEMENT    . 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 KNEE ARTHROSCOPY Right   . LEFT HEART CATH AND CORONARY ANGIOGRAPHY N/A 10/03/2019   Procedure: Left Heart Cath and possible Coronary intervention;  Surgeon: Dionisio David, MD;  Location: [place] CV LAB;  Service: Cardiovascular;  Laterality: N/A;  . TONSILLECTOMY    . VISCERAL ANGIOGRAPHY N/A 05/23/2019   Procedure: VISCERAL ANGIOGRAPHY;  Surgeon: Algernon Huxley, MD;  Location: [place] CV LAB;  Service: Cardiovascular;  Laterality: N/A;  . VISCERAL ANGIOGRAPHY N/A 11/04/2019   Procedure: VISCERAL ANGIOGRAPHY;  Surgeon: Algernon Huxley, MD;  Location: [place] CV LAB;  Service: Cardiovascular;  Laterality: N/A;    There were no vitals filed for this visit.   Subjective Assessment - 10/26/20 1035    Subjective Pt went to ED after last scheduled PT visit, no  fruitful diagnostics warranting emergent medical care, redirected to OP follow up, particularly with DR Lucky Cowboy for [place] of Rt thigh 'claudiacation pain.'    Pertinent History 75 yo Male reports history of imbalance. He is s/p RLE TKA approximately 3 years ago. He also reports history of neuropathy in BLE (R>L) which does affect his balance. His recent EMG study shows Right partial femoral neuropathy and generalized length dependent peripheral neuropathy; He did outpatient PT in Sept-Oct 2021 and stopped due to uncontrolled BP and other medical issues.  He presents to  therapy with SPC. He reports not using cane at work so that he has use of UE for work tasks. He will use SPC when at home especially with stair negotiation; He reports multiple falls (falling just about every day), He reports falling about a month ago straight on his buttocks and reports increased bruising. He denies any fracture. He reports still having the buttocks pain especially with sitting. He is still working at [employer] and reports standing on them 6-7 hours a day. he reports increased pain when he sits down; He reports his BP has been better controlled. He does monitor his BP at home. He reports he is still having episodes of being extremely fatigued and "out of it" having a hard time doing activity at least 1x a day. Patient did receive outpatient PT earlier in the year for RLE weakness with no improvement; Therapy consisted of strengthening, ultrasound, with no improvement; He was given a HEP and reports he would do them sometimes; He is not doing them now. 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  His celiac velocities are also elevated today. He does have a RW at home but states he hasn't been using it.    Currently in Pain? Yes    Pain Score 2     Pain Location --   Right thigh, mid distance anterior.          INTERVENTION THIS DATE: -supine to/from sitting x3, minguard to minA  -static standing balance 1x3 minutes, min guard assist  -education on hydration needs wen transitioning to new diuretic -education on S/Sx orthostatic hypotension and safe management at home   Orthostatic BP assessment:  Supine: 145/44mmHg, 63bpm Seated 0  min: 132/74mmHg, 63bpm  Seated x3 min: 132/24mmHg, 61bpm  Standing x0 min: 112/19mmHg, 61bpm Standing x2 min: 115/30mmHg, 63bpm (becoming dizzy, less alert)  Standing x3 min: 114/72mmHg, 64bpm (symptoms progressing)   *pt moved back to supine for following 15 minutes, 69floz water, then 16oz orange juice *pt mentions low CPG this morning in 70s, but has since had OJ, eggs, and pastry (mentation precludes detailed report)  *pt remain drowsy to somnolent for remainder of session, with intermittent dizziness *Author calls pt's son to come provide safe transport back to home.      PT Short Term Goals - 10/14/20 1755      PT SHORT TERM GOAL #1   Title Patient will be adherent to HEP at least 3x a week to improve functional strength and balance for better safety at home.    Baseline 10/06/20  pt reports he has been working and has not had as much time for  exercises at home. 2/2: improving but not yet fully compliant    Time 2    Period Weeks    Status On-going    Target Date 10/28/20      PT SHORT TERM GOAL #2   Title Patient will deny any falls over last 2 weeks to exhibit improvement in safety awareness and improved balance at home and at work.    Baseline Patient falls 10/06/20  pt reports falling 6 times in the past 2 weeks including out of bed and onto the floor and one at work 2/2: continues to fall    Time 2    Period Weeks    Status Partially Met    Target Date 10/28/20             PT Long Term Goals - 10/14/20 1037      PT LONG TERM GOAL #1   Title Patient will increase Berg Balance score by > 6 points to demonstrate decreased fall risk during functional activities.    Baseline tested on 09/17/20: 36/56 2/2: 39/56    Time 4    Period Weeks    Status Partially Met    Target Date 11/11/20      PT LONG TERM GOAL #2   Title Patient will increase 10 meter walk test to >0.8 m/s as to improve gait speed for better community ambulation and to reduce fall risk.    Baseline  12/28: 0.44 m/s, 10/06/20 .52 m/s 2/2: 0.71 m/s    Time 4    Period Weeks    Status Partially Met    Target Date 11/11/20      PT LONG TERM GOAL #3   Title Patient will improve FOTO score to >60% to indicate improved functional mobility at home and at work.    Baseline 12/28: 48% 2/2: 53%    Time 4    Period Weeks    Status Partially Met    Target Date 11/11/20      PT LONG TERM GOAL #4   Title Patient will reduce 5 times sit<>Stand to <20 sec to indicate improved transfer ability and improved LE functional strength.    Baseline 12/28: 41 sec, 10/06/20  33.22 seconds 2/2: 25.4 seconds    Time 4    Period Weeks    Status Partially Met    Target Date 11/11/20                 Plan - 10/26/20 1048    Clinical Impression Statement Pt arrives late for session, gives reports of 2 ED visits since last week. Most recently went to ED Saturday after having intermittent CP, at Sunday at work, pt having acute onset lethargy, decreased mentation, dizziness, asked to go home after boss noticed him having difficulty. Planned to screen orthostatic BP this date, noted orthostatic drop from supine to sitting (poor recovery) then moreso with transition to standing, pt becoming dizzy and altered whilst up for 3 minutes, HR remains unchanged with BP drops, remaining 63 bpm. Pt given water to drink, educated on hydration, nature of orthostatic BP issues and instruction to manage pressure at home with symptoms.    Personal Factors and Comorbidities Age;Comorbidity 3+;Past/Current Experience;Time since onset of injury/illness/exacerbation    Comorbidities Depression, HTN, DMx2, hypercholesterolemia, GERD, neuropathy    Examination-Activity Limitations Squat;Stairs;Locomotion Level;Stand;Transfers    Examination-Participation Restrictions Cleaning;Community Activity;Meal Prep;Occupation;Shop;Volunteer;Yard Work    Tourist information centre manager  Rehab Potential [place]    PT Frequency 2x / week    PT Duration 4 weeks    PT Treatment/Interventions Cryotherapy;Electrical Stimulation;Moist Heat;Gait training;Stair training;Functional mobility training;Therapeutic activities;Therapeutic exercise;Balance training;Neuromuscular re-education;Patient/family education;Orthotic Fit/Training;Energy conservation    PT Next Visit Plan work on balance/strengthening,    PT Home Exercise Plan initiated- provided with instruction on how to increase adherence including to try some exercise at work    Consulted and Agree with Plan of Care Patient           Patient will benefit from skilled therapeutic intervention in order to improve the following deficits and impairments:  Abnormal gait,Decreased balance,Decreased endurance,Decreased mobility,Difficulty walking,Decreased activity tolerance,Decreased strength  Visit Diagnosis: Muscle weakness (generalized)  Unsteadiness on feet  History of falling     Problem List Patient Active Problem List   Diagnosis Date Noted  . Anemia in chronic kidney disease 05/28/2020  . Benign hypertensive kidney disease with chronic kidney disease 05/28/2020  . Proteinuria 05/28/2020  . Falls frequently 04/27/2020  . Use of cane as ambulatory aid 04/27/2020  . Numbness and tingling of both legs 02/18/2020  . Primary osteoarthritis of left knee 01/04/2020  . Diabetic retinopathy ([place]) 01/01/2020  . ASCVD (arteriosclerotic cardiovascular disease) 10/16/2019  . Hospitalization within last 30 days 10/16/2019  . Hyperkalemia 10/14/2019  . Pseudoaneurysm of right femoral artery ([place]) 10/14/2019  . AKI (acute kidney injury) ([place]) 10/13/2019  . Unstable angina ([place]) 10/02/2019  . Chest pain 10/01/2019  . Uncontrolled type 2 diabetes mellitus with hyperglycemia, with long-term current use of insulin ([place]) 09/27/2019  . Chronic mesenteric ischemia ([place]) 05/06/2019  . Abdominal pain 05/03/2019  . Acute on  chronic renal failure ([place]) 03/23/2019  . AMS (altered mental status) 12/24/2018  . Dizziness 06/30/2018  . HLD (hyperlipidemia) 02/23/2018  . Aortic atherosclerosis ([place]) 11/02/2017  . Healthcare maintenance 12/26/2016  . DM type 2 with diabetic peripheral neuropathy ([place]) 11/09/2016  . Presence of right artificial knee joint 08/14/2016  . Bilateral carotid artery disease ([place]) 07/12/2016  . 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 Acute diarrhea 05/30/2016  . Encephalopathy acute 05/30/2016  . Anemia 05/30/2016  . Acute on chronic renal insufficiency 05/30/2016  . Diarrhea 05/30/2016  . Stage 3b chronic kidney disease ([place]) 03/30/2015  . Depression, major, in remission ([place]) 08/02/2014  . Severe obesity (BMI 35.0-39.9) with comorbidity ([place]) 07/12/2014  . GERD (gastroesophageal reflux disease) 03/08/2014  . Hyperlipidemia associated with type 2 diabetes mellitus ([place]) 03/08/2014  . Drug-induced parkinsonism ([place]) 01/24/2014  . Diabetes mellitus with stage 3 chronic kidney disease ([place]) 01/21/2014  . HTN (hypertension), benign 01/21/2014  . OSA (obstructive sleep apnea) 01/21/2014  . Unspecified transient cerebral ischemia 05/29/2012   12:22 PM, 10/26/20 Etta Grandchild, PT, DPT Physical Therapist - [place] [phone]     Etta Grandchild 10/26/2020, 12:15 PM  [place] MAIN [facility] SERVICES [address] [place], [place], [address] Phone: [phone]   Fax:  [phone]  Name: Patrick Turner MRN: [id number] Date of Birth: [birth year]

## 2020-10-27 ENCOUNTER — Ambulatory Visit: Payer: Self-pay

## 2020-10-28 ENCOUNTER — Ambulatory Visit: Payer: Medicare HMO

## 2020-10-28 ENCOUNTER — Other Ambulatory Visit: Payer: Self-pay

## 2020-10-28 DIAGNOSIS — M6281 Muscle weakness (generalized): Secondary | ICD-10-CM | POA: Diagnosis not present

## 2020-10-28 DIAGNOSIS — Z9181 History of falling: Secondary | ICD-10-CM | POA: Diagnosis not present

## 2020-10-28 DIAGNOSIS — I129 Hypertensive chronic kidney disease with stage 1 through stage 4 chronic kidney disease, or unspecified chronic kidney disease: Secondary | ICD-10-CM | POA: Diagnosis not present

## 2020-10-28 DIAGNOSIS — R2681 Unsteadiness on feet: Secondary | ICD-10-CM | POA: Diagnosis not present

## 2020-10-28 DIAGNOSIS — E1122 Type 2 diabetes mellitus with diabetic chronic kidney disease: Secondary | ICD-10-CM | POA: Diagnosis not present

## 2020-10-28 DIAGNOSIS — N1832 Chronic kidney disease, stage 3b: Secondary | ICD-10-CM | POA: Diagnosis not present

## 2020-10-28 NOTE — Therapy (Signed)
[place] MAIN [facility] SERVICES [address] [place], [place], [address] Phone: [phone]   Fax:  [phone]  Physical Therapy Treatment Physical Therapy Progress Note   Dates of reporting period  10/14/2020  to   10/28/2020  Patient Details  Name: Patrick Turner MRN: [id number] Date of Birth: [birth year] Referring Provider (PT): Dr. Marry Guan   Encounter Date: 10/28/2020   PT End of Session - 10/28/20 1007    Visit Number 10    Number of Visits 16    Date for PT Re-Evaluation 11/11/20    Authorization Type [employer], $40 copay    Authorization Time Period 1/31-11/13/20 9 PT visits    PT Start Time 1010    PT Stop Time 1050    PT Time Calculation (min) 40 min    Equipment Utilized During Treatment Gait belt    Activity Tolerance Patient tolerated treatment well;No increased pain;Patient limited by fatigue    Behavior During Therapy [facility] for tasks assessed/performed           Past Medical History:  Diagnosis Date  . Altered mental status   . Anemia   . Anxiety   . Aortic atherosclerosis ([place])   . Arthritis   . Coronary artery disease   . CRD (chronic renal disease)   . Depression   . Diabetes mellitus without complication ([place])   . Encephalopathy acute   . GERD (gastroesophageal reflux disease)   . Headache   . Hypercholesteremia   . Hypertension   . Neuropathy   . Severe obesity ([place])   . Sleep apnea   . Sleep terror    per patient this year per patient     Past Surgical History:  Procedure Laterality Date  . APPENDECTOMY    . CIRCUMCISION    . COLONOSCOPY    . COLONOSCOPY WITH PROPOFOL N/A 07/10/2018   Procedure: COLONOSCOPY WITH PROPOFOL;  Surgeon: Lollie Sails, MD;  Location: [facility] ENDOSCOPY;  Service: Endoscopy;  Laterality: N/A;  . COLONOSCOPY WITH PROPOFOL N/A 04/23/2019   Procedure: COLONOSCOPY WITH PROPOFOL;  Surgeon: Lollie Sails, MD;  Location: [facility] ENDOSCOPY;  Service: Endoscopy;  Laterality:  N/A;  . CORONARY STENT INTERVENTION N/A 10/03/2019   Procedure: CORONARY STENT INTERVENTION;  Surgeon: Yolonda Kida, MD;  Location: [place] CV LAB;  Service: Cardiovascular;  Laterality: N/A;  . ESOPHAGOGASTRODUODENOSCOPY    . ESOPHAGOGASTRODUODENOSCOPY (EGD) WITH PROPOFOL N/A 04/23/2019   Procedure: ESOPHAGOGASTRODUODENOSCOPY (EGD) WITH PROPOFOL;  Surgeon: Lollie Sails, MD;  Location: [facility] ENDOSCOPY;  Service: Endoscopy;  Laterality: N/A;  . JOINT REPLACEMENT    . 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 KNEE ARTHROSCOPY Right   . LEFT HEART CATH AND CORONARY ANGIOGRAPHY N/A 10/03/2019   Procedure: Left Heart Cath and possible Coronary intervention;  Surgeon: Dionisio David, MD;  Location: [place] CV LAB;  Service: Cardiovascular;  Laterality: N/A;  . TONSILLECTOMY    . VISCERAL ANGIOGRAPHY N/A 05/23/2019   Procedure: VISCERAL ANGIOGRAPHY;  Surgeon: Algernon Huxley, MD;  Location: [place] CV LAB;  Service: Cardiovascular;  Laterality: N/A;  . VISCERAL ANGIOGRAPHY N/A 11/04/2019   Procedure: VISCERAL ANGIOGRAPHY;  Surgeon: Algernon Huxley, MD;  Location: [place] CV LAB;  Service: Cardiovascular;  Laterality: N/A;    There were no vitals filed for this visit.   Subjective Assessment -  10/28/20 1015    Subjective The patient reports that he went to the nephrologist this morning and he is to take only half a lisinopril pill at this time.  The patient rpeorts his systolic being in the 917'H this morning.  Patient reports he is feeling better overall.  The patient reports no current dizziness but at home he does get dizzy during the day.    Pertinent History 75 yo Male reports history of imbalance. He is s/p RLE TKA approximately 3 years ago. He also reports history of neuropathy in BLE (R>L) which does affect his balance. His  recent EMG study shows Right partial femoral neuropathy and generalized length dependent peripheral neuropathy; He did outpatient PT in Sept-Oct 2021 and stopped due to uncontrolled BP and other medical issues.  He presents to therapy with SPC. He reports not using cane at work so that he has use of UE for work tasks. He will use SPC when at home especially with stair negotiation; He reports multiple falls (falling just about every day), He reports falling about a month ago straight on his buttocks and reports increased bruising. He denies any fracture. He reports still having the buttocks pain especially with sitting. He is still working at [employer] and reports standing on them 6-7 hours a day. he reports increased pain when he sits down; He reports his BP has been better controlled. He does monitor his BP at home. He reports he is still having episodes of being extremely fatigued and "out of it" having a hard time doing activity at least 1x a day. Patient did receive outpatient PT earlier in the year for RLE weakness with no improvement; Therapy consisted of strengthening, ultrasound, with no improvement; He was given a HEP and reports he would do them sometimes; He is not doing them now. 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  His celiac velocities are also elevated today. He does have a RW at home but states he hasn't been using it.    Currently in Pain? No/denies    Pain Score 0-No pain            TREATMENT  pre treat vitals BP 161/62- no symptoms SpO2 100 HR 53  Sit to stand x10 light use of UE support  Seated LAQ 2#  5"x15  bilaterally Seated march 2# x15 each slow, high knees Seated HS curls green TB x15 each  after above exercises BP 173/56- patient reports minor dizziness lasting 2 mins SpO2 95 HR 54   Seated ball squeeze 5"x15 Seated clamshell with green TB 5"x15 Seated HR 10# dumbbells held on each thigh 5"x15    Post treatment  BP 177/57- patient reports minor dizziness SpO2 99 HR 56  after 2 mins rest BP 164/53-  Patient reports minor dizziness               PT Short Term Goals - 10/28/20 1156      PT SHORT TERM GOAL #1   Title Patient will be adherent to HEP at least 3x a week to improve functional strength and balance for better safety at home.    Baseline 10/06/20  pt reports he has  been working and has not had as much time for exercises at home. 2/2: improving but not yet fully compliant    Time 2    Period Weeks    Status On-going    Target Date 11/11/20      PT SHORT TERM GOAL #2   Title Patient will deny any falls over last 2 weeks to exhibit improvement in safety awareness and improved balance at home and at work.    Baseline Patient falls 10/06/20  pt reports falling 6 times in the past 2 weeks including out of bed and onto the floor and one at work 2/2: continues to fall    Time 2    Period Weeks    Status Partially Met    Target Date 11/11/20             PT Long Term Goals - 10/14/20 1037      PT LONG TERM GOAL #1   Title Patient will increase Berg Balance score by > 6 points to demonstrate decreased fall risk during functional activities.    Baseline tested on 09/17/20: 36/56 2/2: 39/56    Time 4    Period Weeks    Status Partially Met    Target Date 11/11/20      PT LONG TERM GOAL #2   Title Patient will increase 10 meter walk test to >0.8 m/s as to improve gait speed for better community ambulation and to reduce fall risk.    Baseline 12/28: 0.44 m/s, 10/06/20 .52 m/s 2/2: 0.71 m/s    Time 4    Period Weeks    Status Partially Met    Target Date  11/11/20      PT LONG TERM GOAL #3   Title Patient will improve FOTO score to >60% to indicate improved functional mobility at home and at work.    Baseline 12/28: 48% 2/2: 53%    Time 4    Period Weeks    Status Partially Met    Target Date 11/11/20      PT LONG TERM GOAL #4   Title Patient will reduce 5 times sit<>Stand to <20 sec to indicate improved transfer ability and improved LE functional strength.    Baseline 12/28: 41 sec, 10/06/20  33.22 seconds 2/2: 25.4 seconds    Time 4    Period Weeks    Status Partially Met    Target Date 11/11/20                 Plan - 10/28/20 1028    Clinical Impression Statement Patient ambulating with standard cane with 1 episode of LOB walking into treatment area.   Patient's BP was tested pre treatment and after performing seated strengthening exercises with increase from 161/62 to 173/56 with patient reporting minor dizziness lasting 2 mins.  Continued with exercises in seated position with BP post treatment 177/57 then 160/56 with reports of minor dizziness continuing.  The patient continues to benefit from additional skilled PT services to improve balance, gait and LE strength for improved quality of life.  Vitals to be monitored throughout patient's PT treatment sessions.  Goals were not specifically tested today.  The patient's condition has the potential to improve in response to therapy. Maximum improvement is yet to be obtained. The anticipated improvement is attainable and reasonable in a generally predictable time.    Personal Factors and Comorbidities Age;Comorbidity 3+;Past/Current Experience;Time since onset of injury/illness/exacerbation    Comorbidities Depression, HTN, DMx2, hypercholesterolemia, GERD, neuropathy  Examination-Activity Limitations Squat;Stairs;Locomotion Level;Stand;Transfers    Examination-Participation Restrictions Cleaning;Community Activity;Meal Prep;Occupation;Shop;Volunteer;Yard Work    Copy Evolving/Moderate complexity    Rehab Potential Fair    PT Frequency 2x / week    PT Duration 4 weeks    PT Treatment/Interventions Cryotherapy;Electrical Stimulation;Moist Heat;Gait training;Stair training;Functional mobility training;Therapeutic activities;Therapeutic exercise;Balance training;Neuromuscular re-education;Patient/family education;Orthotic Fit/Training;Energy conservation    PT Next Visit Plan work on balance/strengthening,    PT Home Exercise Plan initiated- provided with instruction on how to increase adherence including to try some exercise at work    Consulted and Agree with Plan of Care Patient           Patient will benefit from skilled therapeutic intervention in order to improve the following deficits and impairments:  Abnormal gait,Decreased balance,Decreased endurance,Decreased mobility,Difficulty walking,Decreased activity tolerance,Decreased strength  Visit Diagnosis: Unsteadiness on feet  History of falling  Muscle weakness (generalized)     Problem List Patient Active Problem List   Diagnosis Date Noted  . Anemia in chronic kidney disease 05/28/2020  . Benign hypertensive kidney disease with chronic kidney disease 05/28/2020  . Proteinuria 05/28/2020  . Falls frequently 04/27/2020  . Use of cane as ambulatory aid 04/27/2020  . Numbness and tingling of both legs 02/18/2020  . Primary osteoarthritis of left knee 01/04/2020  . Diabetic retinopathy ([place]) 01/01/2020  . ASCVD (arteriosclerotic cardiovascular disease) 10/16/2019  . Hospitalization within last 30 days 10/16/2019  . Hyperkalemia 10/14/2019  . Pseudoaneurysm of right femoral artery ([place]) 10/14/2019  . AKI (acute kidney injury) ([place]) 10/13/2019  . Unstable angina ([place]) 10/02/2019  . Chest pain 10/01/2019  . Uncontrolled type 2 diabetes mellitus with hyperglycemia, with long-term current use of insulin ([place]) 09/27/2019  . Chronic mesenteric ischemia ([place]) 05/06/2019  .  Abdominal pain 05/03/2019  . Acute on chronic renal failure ([place]) 03/23/2019  . AMS (altered mental status) 12/24/2018  . Dizziness 06/30/2018  . HLD (hyperlipidemia) 02/23/2018  . Aortic atherosclerosis ([place]) 11/02/2017  . Healthcare maintenance 12/26/2016  . DM type 2 with diabetic peripheral neuropathy ([place]) 11/09/2016  . Presence of right artificial knee joint 08/14/2016  . Bilateral carotid artery disease ([place]) 07/12/2016  . 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 Acute diarrhea 05/30/2016  . Encephalopathy acute 05/30/2016  . Anemia 05/30/2016  . Acute on chronic renal insufficiency 05/30/2016  . Diarrhea 05/30/2016  . Stage 3b chronic kidney disease ([place]) 03/30/2015  . Depression, major, in remission ([place]) 08/02/2014  . Severe obesity (BMI 35.0-39.9) with comorbidity ([place]) 07/12/2014  . GERD (gastroesophageal reflux disease) 03/08/2014  . Hyperlipidemia associated with type 2 diabetes mellitus ([place]) 03/08/2014  . Drug-induced parkinsonism ([place]) 01/24/2014  . Diabetes mellitus with stage 3 chronic kidney disease ([place]) 01/21/2014  . HTN (hypertension), benign 01/21/2014  . OSA (obstructive sleep apnea) 01/21/2014  . Unspecified transient cerebral ischemia 05/29/2012    Hal Morales PT, DPT 10/28/2020, 11:58 AM  [place] MAIN [facility] SERVICES [address] [place], [place], [address] Phone: [phone]   Fax:  [phone]  Name: Patrick Turner MRN: [id number] Date of Birth: [birth year]

## 2020-10-30 ENCOUNTER — Other Ambulatory Visit (INDEPENDENT_AMBULATORY_CARE_PROVIDER_SITE_OTHER): Payer: Self-pay | Admitting: Vascular Surgery

## 2020-10-30 DIAGNOSIS — R208 Other disturbances of skin sensation: Secondary | ICD-10-CM

## 2020-11-02 ENCOUNTER — Other Ambulatory Visit: Payer: Self-pay

## 2020-11-02 ENCOUNTER — Ambulatory Visit: Payer: Medicare HMO

## 2020-11-02 DIAGNOSIS — Z9181 History of falling: Secondary | ICD-10-CM | POA: Diagnosis not present

## 2020-11-02 DIAGNOSIS — R2681 Unsteadiness on feet: Secondary | ICD-10-CM

## 2020-11-02 DIAGNOSIS — M6281 Muscle weakness (generalized): Secondary | ICD-10-CM

## 2020-11-02 NOTE — Therapy (Signed)
[place] MAIN [facility] SERVICES [address] [place], [place], [address] Phone: [phone]   Fax:  [phone]  Physical Therapy Treatment  Patient Details  Name: Patrick Turner MRN: [id number] Date of Birth: 1946-02-01 Referring Provider (PT): Dr. Marry Guan   Encounter Date: 11/02/2020   PT End of Session - 11/02/20 1001    Visit Number 11    Number of Visits 16    Date for PT Re-Evaluation 11/11/20    Authorization Type [employer], $40 copay    Authorization Time Period 1/31-11/13/20 9 PT visits    PT Start Time 1007    PT Stop Time 1040    PT Time Calculation (min) 33 min    Equipment Utilized During Treatment Gait belt    Activity Tolerance Patient tolerated treatment well;No increased pain;Patient limited by fatigue    Behavior During Therapy [facility] for tasks assessed/performed           Past Medical History:  Diagnosis Date  . Altered mental status   . Anemia   . Anxiety   . Aortic atherosclerosis ([place])   . Arthritis   . Coronary artery disease   . CRD (chronic renal disease)   . Depression   . Diabetes mellitus without complication ([place])   . Encephalopathy acute   . GERD (gastroesophageal reflux disease)   . Headache   . Hypercholesteremia   . Hypertension   . Neuropathy   . Severe obesity ([place])   . Sleep apnea   . Sleep terror    per patient this year per patient     Past Surgical History:  Procedure Laterality Date  . APPENDECTOMY    . CIRCUMCISION    . COLONOSCOPY    . COLONOSCOPY WITH PROPOFOL N/A 07/10/2018   Procedure: COLONOSCOPY WITH PROPOFOL;  Surgeon: Lollie Sails, MD;  Location: [facility] ENDOSCOPY;  Service: Endoscopy;  Laterality: N/A;  . COLONOSCOPY WITH PROPOFOL N/A 04/23/2019   Procedure: COLONOSCOPY WITH PROPOFOL;  Surgeon: Lollie Sails, MD;  Location: [facility] ENDOSCOPY;  Service: Endoscopy;  Laterality: N/A;  . CORONARY STENT INTERVENTION N/A 10/03/2019   Procedure: CORONARY STENT INTERVENTION;   Surgeon: Yolonda Kida, MD;  Location: [place] CV LAB;  Service: Cardiovascular;  Laterality: N/A;  . ESOPHAGOGASTRODUODENOSCOPY    . ESOPHAGOGASTRODUODENOSCOPY (EGD) WITH PROPOFOL N/A 04/23/2019   Procedure: ESOPHAGOGASTRODUODENOSCOPY (EGD) WITH PROPOFOL;  Surgeon: Lollie Sails, MD;  Location: [facility] ENDOSCOPY;  Service: Endoscopy;  Laterality: N/A;  . JOINT REPLACEMENT    . 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 KNEE ARTHROSCOPY Right   . LEFT HEART CATH AND CORONARY ANGIOGRAPHY N/A 10/03/2019   Procedure: Left Heart Cath and possible Coronary intervention;  Surgeon: Dionisio David, MD;  Location: [place] CV LAB;  Service: Cardiovascular;  Laterality: N/A;  . TONSILLECTOMY    . VISCERAL ANGIOGRAPHY N/A 05/23/2019   Procedure: VISCERAL ANGIOGRAPHY;  Surgeon: Algernon Huxley, MD;  Location: [place] CV LAB;  Service: Cardiovascular;  Laterality: N/A;  . VISCERAL ANGIOGRAPHY N/A 11/04/2019   Procedure: VISCERAL ANGIOGRAPHY;  Surgeon: Algernon Huxley, MD;  Location: [place] CV LAB;  Service: Cardiovascular;  Laterality: N/A;    There were no vitals filed for this visit.   Subjective Assessment - 11/02/20 1000    Subjective Patient reports feeling okay this am and states he is  going to work after PT. He reports having a vascular appointment tomorrow.    Pertinent History 75 yo Male reports history of imbalance. He is s/p RLE TKA approximately 3 years ago. He also reports history of neuropathy in BLE (R>L) which does affect his balance. His recent EMG study shows Right partial femoral neuropathy and generalized length dependent peripheral neuropathy; He did outpatient PT in Sept-Oct 2021 and stopped due to uncontrolled BP and other medical issues.  He presents to therapy with SPC. He reports not using cane at work so that he has  use of UE for work tasks. He will use SPC when at home especially with stair negotiation; He reports multiple falls (falling just about every day), He reports falling about a month ago straight on his buttocks and reports increased bruising. He denies any fracture. He reports still having the buttocks pain especially with sitting. He is still working at [employer] and reports standing on them 6-7 hours a day. he reports increased pain when he sits down; He reports his BP has been better controlled. He does monitor his BP at home. He reports he is still having episodes of being extremely fatigued and "out of it" having a hard time doing activity at least 1x a day. Patient did receive outpatient PT earlier in the year for RLE weakness with no improvement; Therapy consisted of strengthening, ultrasound, with no improvement; He was given a HEP and reports he would do them sometimes; He is not doing them now. 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  His celiac velocities are also elevated today. He does have a RW at home but states he hasn't been using it.    Currently in Pain? No/denies             TREATMENT  pre treat vitals BP 140/50 mmHg-sitting Left UE at rest  Standing=  131/42-  Left UE at rest - asymptomatic SpO2 100 HR 54    Seated LAQ 3#  Hold 3 sec x15 bilaterally Seated march 3# x15 each slow, high knees- Patient able to demo good control and no cues for counting/breathing.  Seated HS curls green TB x15 each  Patient required increased time to complete and verbal cues for specific exercise technique.   Vitals after above  exercises BP 147/50 mmHg.  SpO2 95 HR 54   Patient became tired - denied dizziness or chest pain. Reported his blood glucose this am was 90 and he did eat some cereal.  He was too fatigued to continue and requested a few min to rest as he reported he was going to his job immediately after PT. Terminated session due to undo fatigue today. He reported he felt good enough to drive out and declined assistance or chair escort to car.                             PT Education - 11/02/20 1704    Education Details specific exercise technique    Person(s) Educated Patient    Methods Explanation;Demonstration;Tactile cues;Verbal cues    Comprehension Verbalized understanding;Returned demonstration;Verbal cues required;Need further instruction;Tactile cues required  PT Short Term Goals - 10/28/20 1156      PT SHORT TERM GOAL #1   Title Patient will be adherent to HEP at least 3x a week to improve functional strength and balance for better safety at home.    Baseline 10/06/20  pt reports he has been working and has not had as much time for exercises at home. 2/2: improving but not yet fully compliant    Time 2    Period Weeks    Status On-going    Target Date 11/11/20      PT SHORT TERM GOAL #2   Title Patient will deny any falls over last 2 weeks to exhibit improvement in safety awareness and improved balance at home and at work.    Baseline Patient falls 10/06/20  pt reports falling 6 times in the past 2 weeks including out of bed and onto the floor and one at work 2/2: continues to fall    Time 2    Period Weeks    Status Partially Met    Target Date 11/11/20             PT Long Term Goals - 10/14/20 1037      PT LONG TERM GOAL #1   Title Patient will increase Berg Balance score by > 6 points to demonstrate decreased fall risk during functional activities.    Baseline tested on 09/17/20: 36/56 2/2: 39/56    Time 4    Period Weeks    Status  Partially Met    Target Date 11/11/20      PT LONG TERM GOAL #2   Title Patient will increase 10 meter walk test to >0.8 m/s as to improve gait speed for better community ambulation and to reduce fall risk.    Baseline 12/28: 0.44 m/s, 10/06/20 .52 m/s 2/2: 0.71 m/s    Time 4    Period Weeks    Status Partially Met    Target Date 11/11/20      PT LONG TERM GOAL #3   Title Patient will improve FOTO score to >60% to indicate improved functional mobility at home and at work.    Baseline 12/28: 48% 2/2: 53%    Time 4    Period Weeks    Status Partially Met    Target Date 11/11/20      PT LONG TERM GOAL #4   Title Patient will reduce 5 times sit<>Stand to <20 sec to indicate improved transfer ability and improved LE functional strength.    Baseline 12/28: 41 sec, 10/06/20  33.22 seconds 2/2: 25.4 seconds    Time 4    Period Weeks    Status Partially Met    Target Date 11/11/20                 Plan - 11/02/20 1706    Clinical Impression Statement Treatment limited to seated exercises and terminated early due to patient reporting fatigue. He performed well with cues - slow and controlled but no report of pain or dizziness. He has appointment with Vascular MD tomorrow per his report. He reported he was going to work his 8 hour shift after visit today. Asked if he felt good enough to drive and he responded he felt okay after resting and refused help or even a chair escort to his car.  The patient continues to benefit from additional skilled PT services to improve LE strength and balance for reduced fall risk    Personal Factors  and Comorbidities Age;Comorbidity 3+;Past/Current Experience;Time since onset of injury/illness/exacerbation    Comorbidities Depression, HTN, DMx2, hypercholesterolemia, GERD, neuropathy    Examination-Activity Limitations Squat;Stairs;Locomotion Level;Stand;Transfers    Examination-Participation Restrictions Cleaning;Community Activity;Meal  Prep;Occupation;Shop;Volunteer;Yard Work    [employer] Evolving/Moderate complexity    Rehab Potential Fair    PT Frequency 2x / week    PT Duration 4 weeks    PT Treatment/Interventions Cryotherapy;Electrical Stimulation;Moist Heat;Gait training;Stair training;Functional mobility training;Therapeutic activities;Therapeutic exercise;Balance training;Neuromuscular re-education;Patient/family education;Orthotic Fit/Training;Energy conservation    PT Next Visit Plan continue to monitor vitals during session and progress  balance/strengthening as appropriate.    PT Home Exercise Plan --    Consulted and Agree with Plan of Care Patient           Patient will benefit from skilled therapeutic intervention in order to improve the following deficits and impairments:  Abnormal gait,Decreased balance,Decreased endurance,Decreased mobility,Difficulty walking,Decreased activity tolerance,Decreased strength  Visit Diagnosis: Unsteadiness on feet  History of falling  Muscle weakness (generalized)     Problem List Patient Active Problem List   Diagnosis Date Noted  . Anemia in chronic kidney disease 05/28/2020  . Benign hypertensive kidney disease with chronic kidney disease 05/28/2020  . Proteinuria 05/28/2020  . Falls frequently 04/27/2020  . Use of cane as ambulatory aid 04/27/2020  . Numbness and tingling of both legs 02/18/2020  . Primary osteoarthritis of left knee 01/04/2020  . Diabetic retinopathy ([place]) 01/01/2020  . ASCVD (arteriosclerotic cardiovascular disease) 10/16/2019  . Hospitalization within last 30 days 10/16/2019  . Hyperkalemia 10/14/2019  . Pseudoaneurysm of right femoral artery ([place]) 10/14/2019  . AKI (acute kidney injury) ([place]) 10/13/2019  . Unstable angina ([place]) 10/02/2019  . Chest pain 10/01/2019  . Uncontrolled type 2 diabetes mellitus with hyperglycemia, with long-term current use of insulin ([place]) 09/27/2019  . 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 Abdominal pain 05/03/2019  . Acute on chronic renal failure ([place]) 03/23/2019  . AMS (altered mental status) 12/24/2018  . Dizziness 06/30/2018  . HLD (hyperlipidemia) 02/23/2018  . Aortic atherosclerosis ([place]) 11/02/2017  . Healthcare maintenance 12/26/2016  . DM type 2 with diabetic peripheral neuropathy ([place]) 11/09/2016  . Presence of right artificial knee joint 08/14/2016  . Bilateral carotid artery disease ([place]) 07/12/2016  . 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 Acute diarrhea 05/30/2016  . Encephalopathy acute 05/30/2016  . Anemia 05/30/2016  . Acute on chronic renal insufficiency 05/30/2016  . Diarrhea 05/30/2016  . Stage 3b chronic kidney disease ([place]) 03/30/2015  . Depression, major, in remission ([place]) 08/02/2014  . Severe obesity (BMI 35.0-39.9) with comorbidity ([place]) 07/12/2014  . GERD (gastroesophageal reflux disease) 03/08/2014  . Hyperlipidemia associated with type 2 diabetes mellitus ([place]) 03/08/2014  . Drug-induced parkinsonism ([place]) 01/24/2014  . Diabetes mellitus with stage 3 chronic kidney disease ([place]) 01/21/2014  . HTN (hypertension), benign 01/21/2014  . OSA (obstructive sleep apnea) 01/21/2014  . Unspecified transient cerebral ischemia 05/29/2012    Lewis Moccasin, PT 11/02/2020, 5:14 PM  [place] MAIN [facility] SERVICES [address] [place], [place], [address] Phone: [phone]   Fax:  [phone]  Name: DEONDRAE MCGRAIL MRN: [id number] Date of Birth: 04/04/1946

## 2020-11-03 ENCOUNTER — Ambulatory Visit (INDEPENDENT_AMBULATORY_CARE_PROVIDER_SITE_OTHER): Payer: Medicare HMO

## 2020-11-03 ENCOUNTER — Encounter (INDEPENDENT_AMBULATORY_CARE_PROVIDER_SITE_OTHER): Payer: Self-pay | Admitting: Nurse Practitioner

## 2020-11-03 ENCOUNTER — Other Ambulatory Visit: Payer: Self-pay

## 2020-11-03 ENCOUNTER — Ambulatory Visit (INDEPENDENT_AMBULATORY_CARE_PROVIDER_SITE_OTHER): Payer: Medicare HMO | Admitting: Nurse Practitioner

## 2020-11-03 VITALS — BP 175/67 | HR 69 | Resp 16 | Wt 187.8 lb

## 2020-11-03 DIAGNOSIS — R208 Other disturbances of skin sensation: Secondary | ICD-10-CM

## 2020-11-03 DIAGNOSIS — E1142 Type 2 diabetes mellitus with diabetic polyneuropathy: Secondary | ICD-10-CM | POA: Diagnosis not present

## 2020-11-03 DIAGNOSIS — I10 Essential (primary) hypertension: Secondary | ICD-10-CM | POA: Diagnosis not present

## 2020-11-04 ENCOUNTER — Ambulatory Visit: Payer: Medicare HMO

## 2020-11-04 DIAGNOSIS — R2681 Unsteadiness on feet: Secondary | ICD-10-CM

## 2020-11-04 DIAGNOSIS — Z9181 History of falling: Secondary | ICD-10-CM

## 2020-11-04 DIAGNOSIS — M6281 Muscle weakness (generalized): Secondary | ICD-10-CM | POA: Diagnosis not present

## 2020-11-04 NOTE — Therapy (Signed)
[place] MAIN [facility] SERVICES [address] [place], [place], [address] Phone: [phone]   Fax:  [phone]  Physical Therapy Treatment  Patient Details  Name: Patrick Turner MRN: [id number] Date of Birth: [birth year] Referring Provider (PT): Dr. Marry Guan   Encounter Date: 11/04/2020   PT End of Session - 11/04/20 1129    Visit Number 12    Number of Visits 16    Date for PT Re-Evaluation 11/11/20    Authorization Type [employer], $40 copay    Authorization Time Period 1/31-11/13/20 9 PT visits    PT Start Time 1015    PT Stop Time 1100    PT Time Calculation (min) 45 min    Equipment Utilized During Treatment Gait belt    Activity Tolerance Patient tolerated treatment well;No increased pain;Patient limited by fatigue    Behavior During Therapy [facility] for tasks assessed/performed           Past Medical History:  Diagnosis Date  . Altered mental status   . Anemia   . Anxiety   . Aortic atherosclerosis ([place])   . Arthritis   . Coronary artery disease   . CRD (chronic renal disease)   . Depression   . Diabetes mellitus without complication ([place])   . Encephalopathy acute   . GERD (gastroesophageal reflux disease)   . Headache   . Hypercholesteremia   . Hypertension   . Neuropathy   . Severe obesity ([place])   . Sleep apnea   . Sleep terror    per patient this year per patient     Past Surgical History:  Procedure Laterality Date  . APPENDECTOMY    . CIRCUMCISION    . COLONOSCOPY    . COLONOSCOPY WITH PROPOFOL N/A 07/10/2018   Procedure: COLONOSCOPY WITH PROPOFOL;  Surgeon: Lollie Sails, MD;  Location: [facility] ENDOSCOPY;  Service: Endoscopy;  Laterality: N/A;  . COLONOSCOPY WITH PROPOFOL N/A 04/23/2019   Procedure: COLONOSCOPY WITH PROPOFOL;  Surgeon: Lollie Sails, MD;  Location: [facility] ENDOSCOPY;  Service: Endoscopy;  Laterality: N/A;  . CORONARY STENT INTERVENTION N/A 10/03/2019   Procedure: CORONARY STENT INTERVENTION;   Surgeon: Yolonda Kida, MD;  Location: [place] CV LAB;  Service: Cardiovascular;  Laterality: N/A;  . ESOPHAGOGASTRODUODENOSCOPY    . ESOPHAGOGASTRODUODENOSCOPY (EGD) WITH PROPOFOL N/A 04/23/2019   Procedure: ESOPHAGOGASTRODUODENOSCOPY (EGD) WITH PROPOFOL;  Surgeon: Lollie Sails, MD;  Location: [facility] ENDOSCOPY;  Service: Endoscopy;  Laterality: N/A;  . JOINT REPLACEMENT    . 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 KNEE ARTHROSCOPY Right   . LEFT HEART CATH AND CORONARY ANGIOGRAPHY N/A 10/03/2019   Procedure: Left Heart Cath and possible Coronary intervention;  Surgeon: Dionisio David, MD;  Location: [place] CV LAB;  Service: Cardiovascular;  Laterality: N/A;  . TONSILLECTOMY    . VISCERAL ANGIOGRAPHY N/A 05/23/2019   Procedure: VISCERAL ANGIOGRAPHY;  Surgeon: Algernon Huxley, MD;  Location: [place] CV LAB;  Service: Cardiovascular;  Laterality: N/A;  . VISCERAL ANGIOGRAPHY N/A 11/04/2019   Procedure: VISCERAL ANGIOGRAPHY;  Surgeon: Algernon Huxley, MD;  Location: [place] CV LAB;  Service: Cardiovascular;  Laterality: N/A;    There were no vitals filed for this visit.   Subjective Assessment - 11/04/20 1128    Subjective Pt reports he had apt at vascular yesterday and that  the "blood is moving well" in R LE.  He reports being fatigued upon arrival; he has worked 40+ hours at [employer].  He did not take BP medicine or eat any breakfast before arriving to PT.    Pertinent History 75 yo Male reports history of imbalance. He is s/p RLE TKA approximately 3 years ago. He also reports history of neuropathy in BLE (R>L) which does affect his balance. His recent EMG study shows Right partial femoral neuropathy and generalized length dependent peripheral neuropathy; He did outpatient PT in Sept-Oct 2021 and stopped due to  uncontrolled BP and other medical issues.  He presents to therapy with SPC. He reports not using cane at work so that he has use of UE for work tasks. He will use SPC when at home especially with stair negotiation; He reports multiple falls (falling just about every day), He reports falling about a month ago straight on his buttocks and reports increased bruising. He denies any fracture. He reports still having the buttocks pain especially with sitting. He is still working at [employer] and reports standing on them 6-7 hours a day. he reports increased pain when he sits down; He reports his BP has been better controlled. He does monitor his BP at home. He reports he is still having episodes of being extremely fatigued and "out of it" having a hard time doing activity at least 1x a day. Patient did receive outpatient PT earlier in the year for RLE weakness with no improvement; Therapy consisted of strengthening, ultrasound, with no improvement; He was given a HEP and reports he would do them sometimes; He is not doing them now. 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  His celiac velocities are also elevated today. He does have a RW at home but states he hasn't been using it.            Treatment Today: Observation: pt is somewhat lethargic upon arrival, has difficulty keeping eyes open upon arrival.  Pt states he is fatigued due to long work week but wants to be in PT today.  Pre-tx resting vitals (seated) : BP 171/60, 100 Sp02, HR 55 Standing BP:  245/80, HR 60 (systolic drop of 16)  Seated Exercises: LAQ: 3# ankle weights, x15 ea LE Marches: 3#  ankle weights, x15 ea LE Hamstring curl with green TB x 15 ea LE  Sitting rest break x 3 min  Standing exercises: Standing feet together, head turns up/down x10, R/L x10; PT CGA and pt had 1 loss of balance requiring min A to recover Step over 1/2 foam roller with 180 degree turn after x10 Lateral step over 1/2 FR x10 ea  Re-checked vitals (seated): BP 182/61, 100 Sp02, HR 56 Rest x 3 min Re-checked vitals (seated): BP 182/62  Tx session stopped after this.  Pt reports increased fatigue, same dizziness.  PT educated pt on importance of compliance with his medication protocol today as recommended by his MD.  PT accompanied pt to upstairs valet parking.  Offered ride in transport chair to upstairs for energy conservation; however, he declines.   He had no LOB while amb to elevator or upstairs to front  lobby.        PT Education - 11/04/20 1129    Education Details exercise form    Person(s) Educated Patient    Comprehension Verbalized understanding            PT Short Term Goals - 10/28/20 1156      PT SHORT TERM GOAL #1   Title Patient will be adherent to HEP at least 3x a week to improve functional strength and balance for better safety at home.    Baseline 10/06/20  pt reports he has been working and has not had as much time for exercises at home. 2/2: improving but not yet fully compliant    Time 2    Period Weeks    Status On-going    Target Date 11/11/20      PT SHORT TERM GOAL #2   Title Patient will deny any falls over last 2 weeks to exhibit improvement in safety awareness and improved balance at home and at work.    Baseline Patient falls 10/06/20  pt reports falling 6 times in the past 2 weeks including out of bed and onto the floor and one at work 2/2: continues to fall    Time 2    Period Weeks    Status Partially Met    Target Date 11/11/20             PT Long Term Goals - 10/14/20 1037      PT LONG TERM GOAL #1   Title Patient will increase Berg  Balance score by > 6 points to demonstrate decreased fall risk during functional activities.    Baseline tested on 09/17/20: 36/56 2/2: 39/56    Time 4    Period Weeks    Status Partially Met    Target Date 11/11/20      PT LONG TERM GOAL #2   Title Patient will increase 10 meter walk test to >0.8 m/s as to improve gait speed for better community ambulation and to reduce fall risk.    Baseline 12/28: 0.44 m/s, 10/06/20 .52 m/s 2/2: 0.71 m/s    Time 4    Period Weeks    Status Partially Met    Target Date 11/11/20      PT LONG TERM GOAL #3   Title Patient will improve FOTO score to >60% to indicate improved functional mobility at home and at work.    Baseline 12/28: 48% 2/2: 53%    Time 4    Period Weeks    Status Partially Met    Target Date 11/11/20      PT LONG TERM GOAL #4   Title Patient will reduce 5 times sit<>Stand to <20 sec to indicate improved transfer ability and improved LE functional strength.    Baseline 12/28: 41 sec, 10/06/20  33.22 seconds 2/2: 25.4 seconds    Time 4    Period Weeks    Status Partially Met    Target Date 11/11/20                 Plan - 11/04/20 1131    Clinical Impression Statement Pt's ability to participate in PT session remains limited by c/o fatigue and dizziness/feeling "off balance" during session.  PT not able to safelyprogress therapeutic exercises during session due to pt's variable/orthostatic BP readings and his report of fatigue as well as not taking BP medicine or eating breakfast before arriving to PT this morning.  He should continue to benefit from skilled  PT to improve strength and balance for reduced fall risk.    Personal Factors and Comorbidities Age;Comorbidity 3+;Past/Current Experience;Time since onset of injury/illness/exacerbation    Comorbidities Depression, HTN, DMx2, hypercholesterolemia, GERD, neuropathy    Examination-Activity Limitations Squat;Stairs;Locomotion Level;Stand;Transfers    Examination-Participation  Restrictions Cleaning;Community Activity;Meal Prep;Occupation;Shop;Volunteer;Yard Work    [employer] Evolving/Moderate complexity    Rehab Potential Fair    PT Frequency 2x / week    PT Duration 4 weeks    PT Treatment/Interventions Cryotherapy;Electrical Stimulation;Moist Heat;Gait training;Stair training;Functional mobility training;Therapeutic activities;Therapeutic exercise;Balance training;Neuromuscular re-education;Patient/family education;Orthotic Fit/Training;Energy conservation    PT Next Visit Plan continue to monitor vitals during session and progress  balance/strengthening as appropriate.    Consulted and Agree with Plan of Care Patient           Patient will benefit from skilled therapeutic intervention in order to improve the following deficits and impairments:  Abnormal gait,Decreased balance,Decreased endurance,Decreased mobility,Difficulty walking,Decreased activity tolerance,Decreased strength  Visit Diagnosis: Unsteadiness on feet  History of falling  Muscle weakness (generalized)     Problem List Patient Active Problem List   Diagnosis Date Noted  . Anemia in chronic kidney disease 05/28/2020  . Benign hypertensive kidney disease with chronic kidney disease 05/28/2020  . Proteinuria 05/28/2020  . Falls frequently 04/27/2020  . Use of cane as ambulatory aid 04/27/2020  . Numbness and tingling of both legs 02/18/2020  . Primary osteoarthritis of left knee 01/04/2020  . Diabetic retinopathy ([place]) 01/01/2020  . ASCVD (arteriosclerotic cardiovascular disease) 10/16/2019  . Hospitalization within last 30 days 10/16/2019  . Hyperkalemia 10/14/2019  . Pseudoaneurysm of right femoral artery ([place]) 10/14/2019  . AKI (acute kidney injury) ([place]) 10/13/2019  . Unstable angina ([place]) 10/02/2019  . Chest pain 10/01/2019  . Uncontrolled type 2 diabetes mellitus with hyperglycemia, with long-term current use of insulin ([place]) 09/27/2019  . Chronic  mesenteric ischemia ([place]) 05/06/2019  . Abdominal pain 05/03/2019  . Acute on chronic renal failure ([place]) 03/23/2019  . AMS (altered mental status) 12/24/2018  . Dizziness 06/30/2018  . HLD (hyperlipidemia) 02/23/2018  . Aortic atherosclerosis ([place]) 11/02/2017  . Healthcare maintenance 12/26/2016  . DM type 2 with diabetic peripheral neuropathy ([place]) 11/09/2016  . Presence of right artificial knee joint 08/14/2016  . Bilateral carotid artery disease ([place]) 07/12/2016  . 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 Acute diarrhea 05/30/2016  . Encephalopathy acute 05/30/2016  . Anemia 05/30/2016  . Acute on chronic renal insufficiency 05/30/2016  . Diarrhea 05/30/2016  . Stage 3b chronic kidney disease ([place]) 03/30/2015  . Depression, major, in remission ([place]) 08/02/2014  . Severe obesity (BMI 35.0-39.9) with comorbidity ([place]) 07/12/2014  . GERD (gastroesophageal reflux disease) 03/08/2014  . Hyperlipidemia associated with type 2 diabetes mellitus ([place]) 03/08/2014  . Drug-induced parkinsonism ([place]) 01/24/2014  . Diabetes mellitus with stage 3 chronic kidney disease ([place]) 01/21/2014  . HTN (hypertension), benign 01/21/2014  . OSA (obstructive sleep apnea) 01/21/2014  . Unspecified transient cerebral ischemia 05/29/2012    Pincus Badder 11/04/2020, 11:38 AM Merdis Delay, PT, DPT Physical Therapist - [facility]  Outpatient Physical Therapy- [place] [facility] MAIN [facility] SERVICES [address] [place], [place], [address] Phone: [phone]   Fax:  [phone]  Name: OBADIAH DENNARD MRN: [id number] Date of Birth: Sep 02, 1946

## 2020-11-08 ENCOUNTER — Encounter (INDEPENDENT_AMBULATORY_CARE_PROVIDER_SITE_OTHER): Payer: Self-pay | Admitting: Nurse Practitioner

## 2020-11-08 NOTE — Progress Notes (Signed)
Subjective:    Patient ID: Patrick Turner, male    DOB: 10-05-1945, 75 y.o.   MRN: [id number] Chief Complaint  Patient presents with  . Follow-up    Leg pain    Patient presents today due to abnormal sensations in his lower extremities.  The patient notes that it felt as if "bubbles"' going up his diet and describes it as a burning sensation.  The patient also had the same sensation going up the left side of his chest and some subsequent chest pressure.  This sent him to the emergency room.  Any immediate issues were ruled out at that time.  This is concerning for the patient because the patient had a previous heart catheterization and fell shortly thereafter.  After the patient fell he had pain that was somewhat similar in his leg and it was found that he was losing blood from that catheterization site.  The patient has not had any recent catheterizations prior to this issue.  He did fall however and hit his bottom.  Shortly after that fall the pain returned.  It comes and goes and at its worst the patient has difficulty with walking.  The pain is not consistent with claudication-like symptoms.  He does not have rest pain or ulcers.  There is no fever or chills.  Today noninvasive studies show an ABI 1.37 on the right and 1.20 on the left.  The patient has triphasic tibial artery waveforms bilaterally with good toe waveforms bilaterally.   Review of Systems  Respiratory: Positive for chest tightness.   Musculoskeletal: Positive for arthralgias and gait problem.  Neurological: Positive for weakness.       Objective:   Physical Exam Vitals reviewed.  HENT:     Head: Normocephalic.  Cardiovascular:     Rate and Rhythm: Normal rate.     Pulses: Decreased pulses.  Pulmonary:     Effort: Pulmonary effort is normal.  Skin:    General: Skin is warm and dry.  Neurological:     Mental Status: He is alert and oriented to person, place, and time.     Motor: Weakness present.     Gait:  Gait abnormal.  Psychiatric:        Mood and Affect: Mood normal.        Behavior: Behavior normal.        Thought Content: Thought content normal.        Judgment: Judgment normal.     BP (!) 175/67 (BP Location: Right Arm)   Pulse 69   Resp 16   Wt 187 lb 12.8 oz (85.2 kg)   BMI 31.25 kg/m   Past Medical History:  Diagnosis Date  . Altered mental status   . Anemia   . Anxiety   . Aortic atherosclerosis ([place])   . Arthritis   . Coronary artery disease   . CRD (chronic renal disease)   . Depression   . Diabetes mellitus without complication ([place])   . Encephalopathy acute   . GERD (gastroesophageal reflux disease)   . Headache   . Hypercholesteremia   . Hypertension   . Neuropathy   . Severe obesity ([place])   . Sleep apnea   . Sleep terror    per patient this year per patient     Social History   Socioeconomic History  . Marital status: Married    Spouse name: [name]  . Number of children: 2  . Years of education: Not on file  .  Highest education level: Associate degree: academic program  Occupational History  . Occupation: works at [employer]  Tobacco Use  . Smoking status: Never Smoker  . Smokeless tobacco: Never Used  Vaping Use  . Vaping Use: Never used  Substance and Sexual Activity  . Alcohol use: Yes    Comment: rare  . Drug use: No  . Sexual activity: Not on file  Other Topics Concern  . Not on file  Social History Narrative   Lives at home in private residence with wife   Social Determinants of Health   Financial Resource Strain: Not on file  Food Insecurity: Not on file  Transportation Needs: Not on file  Physical Activity: Not on file  Stress: Not on file  Social Connections: Not on file  Intimate Partner Violence: Not on file    Past Surgical History:  Procedure Laterality Date  . APPENDECTOMY    . CIRCUMCISION    . COLONOSCOPY    . COLONOSCOPY WITH PROPOFOL N/A 07/10/2018   Procedure: COLONOSCOPY WITH PROPOFOL;  Surgeon:  Lollie Sails, MD;  Location: [facility] ENDOSCOPY;  Service: Endoscopy;  Laterality: N/A;  . COLONOSCOPY WITH PROPOFOL N/A 04/23/2019   Procedure: COLONOSCOPY WITH PROPOFOL;  Surgeon: Lollie Sails, MD;  [facility] ENDOSCOPY;  Service: Endoscopy;  Laterality: N/A;  . CORONARY STENT INTERVENTION N/A 10/03/2019   Procedure: CORONARY STENT INTERVENTION;  Surgeon: Yolonda Kida, MD;  Location: [facility] CV LAB;  Service: Cardiovascular;  Laterality: N/A;  . ESOPHAGOGASTRODUODENOSCOPY    . ESOPHAGOGASTRODUODENOSCOPY (EGD) WITH PROPOFOL N/A 04/23/2019   Procedure: ESOPHAGOGASTRODUODENOSCOPY (EGD) WITH PROPOFOL;  Surgeon: Lollie Sails, MD;  Location: [facility] ENDOSCOPY;  Service: Endoscopy;  Laterality: N/A;  . JOINT REPLACEMENT    . 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 KNEE ARTHROSCOPY Right   . LEFT HEART CATH AND CORONARY ANGIOGRAPHY N/A 10/03/2019   Procedure: Left Heart Cath and possible Coronary intervention;  Surgeon: Dionisio David, MD;  Location: [facility] CV LAB;  Service: Cardiovascular;  Laterality: N/A;  . TONSILLECTOMY    . VISCERAL ANGIOGRAPHY N/A 05/23/2019   Procedure: VISCERAL ANGIOGRAPHY;  Surgeon: Algernon Huxley, MD;  Location: [place] CV LAB;  Service: Cardiovascular;  Laterality: N/A;  . VISCERAL ANGIOGRAPHY N/A 11/04/2019   Procedure: VISCERAL ANGIOGRAPHY;  Surgeon: Algernon Huxley, MD;  Location: [place] CV LAB;  Service: Cardiovascular;  Laterality: N/A;    Family History  Problem Relation Age of Onset  . Diabetes Mother   . Heart disease Mother   . Cancer Father   . COPD Father     Allergies  Allergen Reactions  . Penicillins Anaphylaxis    Has patient had a PCN reaction causing immediate rash, facial/tongue/throat swelling, SOB or lightheadedness with hypotension: Yes Has patient had a PCN  reaction causing severe rash involving mucus membranes or skin necrosis: No Has patient had a PCN reaction that required hospitalization Yes Has patient had a PCN reaction occurring within the last 10 years: No If all of the above answers are "NO", then may proceed with Cephalosporin use.  . Tavist [Clemastine] Swelling    CBC Latest Ref Rng & Units 10/23/2020 10/21/2020 03/21/2020  WBC 4.0 - 10.5 K/uL 5.5 5.9 6.0  Hemoglobin 13.0 - 17.0 g/dL 11.0(L) 11.0(L) 10.2(L)  Hematocrit 39.0 - 52.0 % 34.0(L) 33.7(L) 30.5(L)  Platelets 150 -  400 K/uL 242 238 220      CMP     Component Value Date/Time   NA 138 10/23/2020 1632   NA 139 11/24/2012 0716   K 4.7 10/23/2020 1632   K 4.4 11/24/2012 0716   CL 104 10/23/2020 1632   CL 107 11/24/2012 0716   CO2 25 10/23/2020 1632   CO2 29 11/24/2012 0716   GLUCOSE 246 (H) 10/23/2020 1632   GLUCOSE 180 (H) 11/24/2012 0716   BUN 46 (H) 10/23/2020 1632   BUN 20 (H) 11/24/2012 0716   CREATININE 2.16 (H) 10/23/2020 1632   CREATININE 1.27 11/24/2012 0716   CALCIUM 7.9 (L) 10/23/2020 1632   CALCIUM 8.4 (L) 11/24/2012 0716   PROT 6.7 10/23/2020 1539   PROT 6.6 11/23/2012 0231   ALBUMIN 4.0 10/23/2020 1539   ALBUMIN 3.2 (L) 11/23/2012 0231   AST 19 10/23/2020 1539   AST 38 (H) 11/23/2012 0231   ALT 10 10/23/2020 1539   ALT 25 11/23/2012 0231   ALKPHOS 69 10/23/2020 1539   ALKPHOS 71 11/23/2012 0231   BILITOT 0.7 10/23/2020 1539   BILITOT 0.2 11/23/2012 0231   GFRNONAA 31 (L) 10/23/2020 1632   GFRNONAA 58 (L) 11/24/2012 0716   GFRAA 57 (L) 03/21/2020 1914   GFRAA >60 11/24/2012 0716     VAS [place] ABI WITH/WO TBI  Result Date: 11/06/2020 LOWER EXTREMITY DOPPLER STUDY Indications: Rest pain.  Comparison Study: 04/01/2020 Performing Technologist: Charlane Ferretti RT (R)(VS)  Examination Guidelines: A complete evaluation includes at minimum, Doppler waveform signals and systolic blood pressure reading at the level of bilateral brachial, anterior tibial,  and posterior tibial arteries, when vessel segments are accessible. Bilateral testing is considered an integral part of a complete examination. Photoelectric Plethysmograph (PPG) waveforms and toe systolic pressure readings are included as required and additional duplex testing as needed. Limited examinations for reoccurring indications may be performed as noted.  ABI Findings: +---------+------------------+-----+---------+--------+ Right    Rt Pressure (mmHg)IndexWaveform Comment  +---------+------------------+-----+---------+--------+ Brachial 178                                      +---------+------------------+-----+---------+--------+ ATA      217               1.22 triphasic         +---------+------------------+-----+---------+--------+ PTA      244               1.37 triphasic         +---------+------------------+-----+---------+--------+ Great Toe132               0.74 Normal            +---------+------------------+-----+---------+--------+ +---------+------------------+-----+---------+-------+ Left     Lt Pressure (mmHg)IndexWaveform Comment +---------+------------------+-----+---------+-------+ Brachial 175                                     +---------+------------------+-----+---------+-------+ ATA      191               1.07 triphasic        +---------+------------------+-----+---------+-------+ PTA      214               1.20 triphasic        +---------+------------------+-----+---------+-------+ Daine Gip  0.80 Normal           +---------+------------------+-----+---------+-------+ +-------+-----------+-----------+------------+------------+ ABI/TBIToday's ABIToday's TBIPrevious ABIPrevious TBI +-------+-----------+-----------+------------+------------+ Right  1.37       .74        1.45        .71          +-------+-----------+-----------+------------+------------+ Left   1.20       .80        1.16         .69          +-------+-----------+-----------+------------+------------+ Bilateral ABIs appear essentially unchanged compared to prior study on 04/01/2020. Bilateral TBIs appear essentially unchanged compared to prior study on 04/01/2020.  Summary: Right: Resting right ankle-brachial index indicates noncompressible right lower extremity arteries. The right toe-brachial index is normal. Left: Resting left ankle-brachial index is within normal range. No evidence of significant left lower extremity arterial disease. The left toe-brachial index is normal. *See table(s) above for measurements and observations.  Electronically signed by [name] MD on 11/06/2020 at 11:59:26 AM.   Final        Assessment & Plan:   1. Burning sensation of lower extremity Based on noninvasive studies this is not related to peripheral arterial disease.  The patient has excellent blood flow as demonstrated by his noninvasive studies.  Because this pain happened shortly after he fell, I believe it is likely more musculoskeletal in nature.  Patient is advised to discuss with primary care provider for further work-up.  Patient has a history of mesenteric artery stenosis.  He will continue on his routine follow-up schedule.  2. HTN (hypertension), benign Continue antihypertensive medications as already ordered, these medications have been reviewed and there are no changes at this time.   3. DM type 2 with diabetic peripheral neuropathy ([place]) Continue hypoglycemic medications as already ordered, these medications have been reviewed and there are no changes at this time.  Hgb A1C to be monitored as already arranged by primary service    Current Outpatient Medications on File Prior to Visit  Medication Sig Dispense Refill  . ACCU-CHEK AVIVA PLUS test strip Use to test blood sugar twice daily    . Accu-Chek Softclix Lancets lancets Use to test blood sugar twice daily    . acetaminophen (TYLENOL) 325 MG tablet Take 2 tablets  (650 mg total) by mouth every 6 (six) hours as needed for mild pain (or Fever >/= 101). 50 tablet 0  . ascorbic acid (VITAMIN C) 500 MG tablet Take by mouth.    Marland Kitchen aspirin 81 MG tablet Take 1 tablet (81 mg total) by mouth daily.    Marland Kitchen atorvastatin (LIPITOR) 80 MG tablet Take 80 mg by mouth at bedtime.    . carvedilol (COREG) 6.25 MG tablet Take 6.25 mg by mouth 2 (two) times daily with a meal.    . clindamycin (CLEOCIN) 300 MG capsule Take by mouth.    . clonazePAM (KLONOPIN) 1 MG tablet Take 0.5 mg by mouth at bedtime as needed for anxiety.   2  . gabapentin (NEURONTIN) 400 MG capsule Take 800 mg by mouth at bedtime.     . hydrALAZINE (APRESOLINE) 25 MG tablet Take 25 mg by mouth 2 (two) times daily.    . insulin detemir (LEVEMIR) 100 UNIT/ML injection Inject 15 Units into the skin daily.    . iron polysaccharides (NIFEREX) 150 MG capsule Take 150 mg by mouth daily.    Marland Kitchen lisinopril (ZESTRIL) 10 MG tablet Take 10 mg by mouth daily.     Marland Kitchen  metFORMIN (GLUCOPHAGE-XR) 500 MG 24 hr tablet Take 500 mg by mouth 2 (two) times daily with a meal. 2 tablets twice daily with meals    . metoprolol tartrate (LOPRESSOR) 25 MG tablet Take 0.5 tablets (12.5 mg total) by mouth 2 (two) times daily. (Patient taking differently: Take 25 mg by mouth at bedtime.) 60 tablet 0  . Omega-3 Fatty Acids (FISH OIL) 1000 MG CAPS Take 1,000 mg by mouth 2 (two) times daily.     Marland Kitchen OZEMPIC, 0.25 OR 0.5 MG/DOSE, 2 MG/1.5ML SOPN Inject 1 Dose into the skin once a week. Wednesdays    . pantoprazole (PROTONIX) 40 MG tablet Take 40 mg by mouth daily.    Marland Kitchen PARoxetine (PAXIL) 40 MG tablet Take 40 mg by mouth daily.     . pioglitazone (ACTOS) 15 MG tablet Take 15 mg by mouth daily.     . QUEtiapine (SEROQUEL) 100 MG tablet Take 100 mg by mouth at bedtime.    . ranolazine (RANEXA) 500 MG 12 hr tablet Take 1 tablet by mouth 2 (two) times daily.    Marland Kitchen torsemide (DEMADEX) 20 MG tablet Take 20 mg by mouth daily.    . vitamin B-12  (CYANOCOBALAMIN) 1000 MCG tablet Take 1,000 mcg by mouth daily.    . Vitamin D, Ergocalciferol, (DRISDOL) 1.25 MG (50000 UNIT) CAPS capsule     . clopidogrel (PLAVIX) 75 MG tablet Take 1 tablet (75 mg total) by mouth daily. (Patient not taking: No sig reported) 30 tablet 11   No current facility-administered medications on file prior to visit.    There are no Patient Instructions on file for this visit. No follow-ups on file.   Kris Hartmann, NP

## 2020-11-09 ENCOUNTER — Ambulatory Visit: Payer: Medicare HMO | Admitting: Physical Therapy

## 2020-11-11 ENCOUNTER — Ambulatory Visit: Payer: Medicare HMO | Attending: Neurology | Admitting: Physical Therapy

## 2020-11-25 DIAGNOSIS — E1122 Type 2 diabetes mellitus with diabetic chronic kidney disease: Secondary | ICD-10-CM | POA: Diagnosis not present

## 2020-11-25 DIAGNOSIS — N183 Chronic kidney disease, stage 3 unspecified: Secondary | ICD-10-CM | POA: Diagnosis not present

## 2020-11-25 DIAGNOSIS — Z794 Long term (current) use of insulin: Secondary | ICD-10-CM | POA: Diagnosis not present

## 2020-11-25 DIAGNOSIS — E113393 Type 2 diabetes mellitus with moderate nonproliferative diabetic retinopathy without macular edema, bilateral: Secondary | ICD-10-CM | POA: Diagnosis not present

## 2020-11-25 DIAGNOSIS — E1142 Type 2 diabetes mellitus with diabetic polyneuropathy: Secondary | ICD-10-CM | POA: Diagnosis not present

## 2020-11-27 ENCOUNTER — Ambulatory Visit (INDEPENDENT_AMBULATORY_CARE_PROVIDER_SITE_OTHER): Payer: Medicare HMO

## 2020-11-27 ENCOUNTER — Other Ambulatory Visit: Payer: Self-pay

## 2020-11-27 ENCOUNTER — Ambulatory Visit (INDEPENDENT_AMBULATORY_CARE_PROVIDER_SITE_OTHER): Payer: Medicare HMO | Admitting: Vascular Surgery

## 2020-11-27 ENCOUNTER — Encounter (INDEPENDENT_AMBULATORY_CARE_PROVIDER_SITE_OTHER): Payer: Self-pay | Admitting: Vascular Surgery

## 2020-11-27 VITALS — BP 170/61 | HR 75 | Resp 16 | Wt 198.8 lb

## 2020-11-27 DIAGNOSIS — E785 Hyperlipidemia, unspecified: Secondary | ICD-10-CM | POA: Diagnosis not present

## 2020-11-27 DIAGNOSIS — I10 Essential (primary) hypertension: Secondary | ICD-10-CM | POA: Diagnosis not present

## 2020-11-27 DIAGNOSIS — K551 Chronic vascular disorders of intestine: Secondary | ICD-10-CM

## 2020-11-27 DIAGNOSIS — N1832 Chronic kidney disease, stage 3b: Secondary | ICD-10-CM

## 2020-11-27 DIAGNOSIS — I6523 Occlusion and stenosis of bilateral carotid arteries: Secondary | ICD-10-CM | POA: Diagnosis not present

## 2020-11-27 DIAGNOSIS — E1142 Type 2 diabetes mellitus with diabetic polyneuropathy: Secondary | ICD-10-CM

## 2020-11-27 NOTE — Assessment & Plan Note (Signed)
He has a known history of carotid disease and we performed a carotid duplex today.  This demonstrates mild carotid artery stenosis in the 1 to 39% range bilaterally.  Continue current medical regimen.  We can recheck this every other year.

## 2020-11-27 NOTE — Progress Notes (Signed)
MRN : [id number]  Patrick Turner is a 75 y.o. (02/05/1946) male who presents with chief complaint of  Chief Complaint  Patient presents with  . Follow-up    Ultrasound follow up  .  History of Present Illness: Patient returns in follow-up of multiple vascular issues.  His legs are still bothering him a lot but his perfusion was recently checked and was found to be good.  He has a known history of carotid disease and we performed a carotid duplex today.  This demonstrates mild carotid artery stenosis in the 1 to 39% range bilaterally.  No focal neurologic symptoms. Specifically, the patient denies amaurosis fugax, speech or swallowing difficulties, or arm or leg weakness or numbness. He underwent SMA stent placement for chronic mesenteric ischemic symptoms last year.  Following this, he has had much less abdominal pain and is eating better without weight loss. Duplex today shows some mildly elevated velocities in the celiac artery consistent with some degree of stenosis.  His SMA velocities are normal and the previously placed stent appears to be patent with good flow.   Current Outpatient Medications  Medication Sig Dispense Refill  . ACCU-CHEK AVIVA PLUS test strip Use to test blood sugar twice daily    . Accu-Chek Softclix Lancets lancets Use to test blood sugar twice daily    . acetaminophen (TYLENOL) 325 MG tablet Take 2 tablets (650 mg total) by mouth every 6 (six) hours as needed for mild pain (or Fever >/= 101). 50 tablet 0  . ascorbic acid (VITAMIN C) 500 MG tablet Take by mouth.    Marland Kitchen aspirin 81 MG tablet Take 1 tablet (81 mg total) by mouth daily.    Marland Kitchen atorvastatin (LIPITOR) 80 MG tablet Take 80 mg by mouth at bedtime.    . carvedilol (COREG) 6.25 MG tablet Take 6.25 mg by mouth 2 (two) times daily with a meal.    . clindamycin (CLEOCIN) 300 MG capsule Take by mouth.    . clonazePAM (KLONOPIN) 1 MG tablet Take 0.5 mg by mouth at bedtime as needed for anxiety.   2  . gabapentin  (NEURONTIN) 400 MG capsule Take 800 mg by mouth at bedtime.     . hydrALAZINE (APRESOLINE) 25 MG tablet Take 25 mg by mouth 2 (two) times daily.    . insulin detemir (LEVEMIR) 100 UNIT/ML injection Inject 15 Units into the skin daily.    . iron polysaccharides (NIFEREX) 150 MG capsule Take 150 mg by mouth daily.    Marland Kitchen lisinopril (ZESTRIL) 10 MG tablet Take 10 mg by mouth daily.     . metFORMIN (GLUCOPHAGE-XR) 500 MG 24 hr tablet Take 500 mg by mouth 2 (two) times daily with a meal. 2 tablets twice daily with meals    . metoprolol tartrate (LOPRESSOR) 25 MG tablet Take 0.5 tablets (12.5 mg total) by mouth 2 (two) times daily. (Patient taking differently: Take 25 mg by mouth at bedtime.) 60 tablet 0  . Omega-3 Fatty Acids (FISH OIL) 1000 MG CAPS Take 1,000 mg by mouth 2 (two) times daily.     Marland Kitchen OZEMPIC, 0.25 OR 0.5 MG/DOSE, 2 MG/1.5ML SOPN Inject 1 Dose into the skin once a week. Wednesdays    . pantoprazole (PROTONIX) 40 MG tablet Take 40 mg by mouth daily.    Marland Kitchen PARoxetine (PAXIL) 40 MG tablet Take 40 mg by mouth daily.     . pioglitazone (ACTOS) 15 MG tablet Take 15 mg by mouth daily.     Marland Kitchen  QUEtiapine (SEROQUEL) 100 MG tablet Take 100 mg by mouth at bedtime.    . ranolazine (RANEXA) 500 MG 12 hr tablet Take 1 tablet by mouth 2 (two) times daily.    Marland Kitchen torsemide (DEMADEX) 20 MG tablet Take 20 mg by mouth daily.    . vitamin B-12 (CYANOCOBALAMIN) 1000 MCG tablet Take 1,000 mcg by mouth daily.    . Vitamin D, Ergocalciferol, (DRISDOL) 1.25 MG (50000 UNIT) CAPS capsule     . clopidogrel (PLAVIX) 75 MG tablet Take 1 tablet (75 mg total) by mouth daily. (Patient not taking: No sig reported) 30 tablet 11   No current facility-administered medications for this visit.    Past Medical History:  Diagnosis Date  . Altered mental status   . Anemia   . Anxiety   . Aortic atherosclerosis ([place])   . Arthritis   . Coronary artery disease   . CRD (chronic renal disease)   . Depression   . Diabetes  mellitus without complication ([place])   . Encephalopathy acute   . GERD (gastroesophageal reflux disease)   . Headache   . Hypercholesteremia   . Hypertension   . Neuropathy   . Severe obesity ([place])   . Sleep apnea   . Sleep terror    per patient this year per patient     Past Surgical History:  Procedure Laterality Date  . APPENDECTOMY    . CIRCUMCISION    . COLONOSCOPY    . COLONOSCOPY WITH PROPOFOL N/A 07/10/2018   Procedure: COLONOSCOPY WITH PROPOFOL;  Surgeon: Lollie Sails, MD;  Location: [facility] ENDOSCOPY;  Service: Endoscopy;  Laterality: N/A;  . COLONOSCOPY WITH PROPOFOL N/A 04/23/2019   Procedure: COLONOSCOPY WITH PROPOFOL;  Surgeon: Lollie Sails, MD;  Location: [facility] ENDOSCOPY;  Service: Endoscopy;  Laterality: N/A;  . CORONARY STENT INTERVENTION N/A 10/03/2019   Procedure: CORONARY STENT INTERVENTION;  Surgeon: Yolonda Kida, MD;  Location: [place] CV LAB;  Service: Cardiovascular;  Laterality: N/A;  . ESOPHAGOGASTRODUODENOSCOPY    . ESOPHAGOGASTRODUODENOSCOPY (EGD) WITH PROPOFOL N/A 04/23/2019   Procedure: ESOPHAGOGASTRODUODENOSCOPY (EGD) WITH PROPOFOL;  Surgeon: Lollie Sails, MD;  Location: [facility] ENDOSCOPY;  Service: Endoscopy;  Laterality: N/A;  . JOINT REPLACEMENT    . 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 KNEE ARTHROSCOPY Right   . LEFT HEART CATH AND CORONARY ANGIOGRAPHY N/A 10/03/2019   Procedure: Left Heart Cath and possible Coronary intervention;  Surgeon: Dionisio David, MD;  Location: [place] [facility] LAB;  Service: Cardiovascular;  Laterality: N/A;  . TONSILLECTOMY    . VISCERAL ANGIOGRAPHY N/A 05/23/2019   Procedure: VISCERAL ANGIOGRAPHY;  Surgeon: Algernon Huxley, MD;  Location: [place] [facility] LAB;  Service: Cardiovascular;  Laterality: N/A;  . VISCERAL ANGIOGRAPHY N/A 11/04/2019   Procedure:  VISCERAL ANGIOGRAPHY;  Surgeon: Algernon Huxley, MD;  Location: [place] [facility] LAB;  Service: Cardiovascular;  Laterality: N/A;     Social History   Tobacco Use  . Smoking status: Never Smoker  . Smokeless tobacco: Never Used  Vaping Use  . Vaping Use: Never used  Substance Use Topics  . Alcohol use: Yes    Comment: rare  . Drug use: No      Family History  Problem Relation Age of Onset  . Diabetes Mother   . Heart disease Mother   . Cancer Father   .  COPD Father      Allergies  Allergen Reactions  . Penicillins Anaphylaxis    Has patient had a PCN reaction causing immediate rash, facial/tongue/throat swelling, SOB or lightheadedness with hypotension: Yes Has patient had a PCN reaction causing severe rash involving mucus membranes or skin necrosis: No Has patient had a PCN reaction that required hospitalization Yes Has patient had a PCN reaction occurring within the last 10 years: No If all of the above answers are "NO", then may proceed with Cephalosporin use.  . Tavist [Clemastine] Swelling     REVIEW OF SYSTEMS(Negative unless checked)  Constitutional: [x] ??????Weight loss [] ??????Fever [] ??????Chills Cardiac: [] ??????Chest pain [] ??????Chest pressure [] ??????Palpitations [] ??????Shortness of breath when laying flat [] ??????Shortness of breath at rest [] ??????Shortness of breath with exertion. Vascular: [] ??????Pain in legs with walking [] ??????Pain in legs at rest [] ??????Pain in legs when laying flat [] ??????Claudication [] ??????Pain in feet when walking [] ??????Pain in feet at rest [] ??????Pain in feet when laying flat [] ??????History of DVT [] ??????Phlebitis [] ??????Swelling in legs [] ??????Varicose veins [] ??????Non-healing ulcers Pulmonary: [] ??????Uses home oxygen [] ??????Productive cough [] ??????Hemoptysis [] ??????Wheeze [] ??????COPD [] ??????Asthma Neurologic: [] ??????Dizziness [] ??????Blackouts  [] ??????Seizures [] ??????History of stroke [] ??????History of TIA [] ??????Aphasia [] ??????Temporary blindness [] ??????Dysphagia [] ??????Weakness or numbness in arms [x] ??????Weakness or numbness in legs Musculoskeletal: [x] ??????Arthritis [] ??????Joint swelling [x] ??????Joint pain [] ??????Low back pain Hematologic: [] ??????Easy bruising [] ??????Easy bleeding [] ??????Hypercoagulable state [] ??????Anemic  Gastrointestinal: [] ??????Blood in stool [] ??????Vomiting blood [x] ??????Gastroesophageal reflux/heartburn [x] ??????Abdominal pain Genitourinary: [] ??????Chronic kidney disease [] ??????Difficult urination [] ??????Frequent urination [] ??????Burning with urination [] ??????Hematuria Skin: [] ??????Rashes [] ??????Ulcers [] ??????Wounds Psychological: [] ??????History of anxiety [] ??????History of major depression.  Physical Examination  Vitals:   11/27/20 0911  BP: (!) 170/61  Pulse: 75  Resp: 16  Weight: 198 lb 12.8 oz (90.2 kg)   Body mass index is 33.08 kg/m. Gen:  WD/WN, NAD Head: [place]/AT, No temporalis wasting. Ear/Nose/Throat: Hearing grossly intact, nares w/o erythema or drainage, trachea midline Eyes: Conjunctiva clear. Sclera non-icteric Neck: Supple.  Soft left carotid bruit  Pulmonary:  Good air movement, equal and clear to auscultation bilaterally.  Cardiac: RRR, No JVD Vascular:  Vessel Right Left  Radial Palpable Palpable                          PT Palpable Palpable  DP Palpable Palpable   Gastrointestinal: soft, non-tender/non-distended. No guarding/reflex.  Musculoskeletal: M/S 5/5 throughout.  No deformity or atrophy.  Mild lower edema. Neurologic: CN 2-12 intact. Sensation grossly intact in extremities.  Symmetrical.  Speech is fluent. Motor exam as listed above. Psychiatric: Judgment intact, Mood & affect appropriate for pt's clinical situation. Dermatologic: No rashes or ulcers noted.  No cellulitis or open  wounds.    CBC Lab Results  Component Value Date   WBC 5.5 10/23/2020   HGB 11.0 (L) 10/23/2020   HCT 34.0 (L) 10/23/2020   MCV 91.9 10/23/2020   PLT 242 10/23/2020    BMET    Component Value Date/Time   NA 138 10/23/2020 1632   NA 139 11/24/2012 0716   K 4.7 10/23/2020 1632   K 4.4 11/24/2012 0716   CL 104 10/23/2020 1632   CL 107 11/24/2012 0716   CO2 25 10/23/2020 1632   CO2 29 11/24/2012 0716   GLUCOSE 246 (H) 10/23/2020 1632   GLUCOSE 180 (H) 11/24/2012 0716   BUN 46 (H) 10/23/2020 1632   BUN 20 (H) 11/24/2012 0716   CREATININE 2.16 (H) 10/23/2020 1632   CREATININE 1.27 11/24/2012 0716   CALCIUM 7.9 (L) 10/23/2020 1632   CALCIUM 8.4 (L)  11/24/2012 0716   GFRNONAA 31 (L) 10/23/2020 1632   GFRNONAA 58 (L) 11/24/2012 0716   GFRAA 57 (L) 03/21/2020 1914   GFRAA >60 11/24/2012 0716   CrCl cannot be calculated (Patient's most recent lab result is older than the maximum 21 days allowed.).  COAG Lab Results  Component Value Date   INR 1.6 (H) 10/03/2019   INR 1.1 10/02/2019   INR 1.06 06/30/2018    Radiology VAS [place] ABI WITH/WO TBI  Result Date: 11/06/2020 LOWER EXTREMITY DOPPLER STUDY Indications: Rest pain.  Comparison Study: 04/01/2020 Performing Technologist: Charlane Ferretti RT (R)(VS)  Examination Guidelines: A complete evaluation includes at minimum, Doppler waveform signals and systolic blood pressure reading at the level of bilateral brachial, anterior tibial, and posterior tibial arteries, when vessel segments are accessible. Bilateral testing is considered an integral part of a complete examination. Photoelectric Plethysmograph (PPG) waveforms and toe systolic pressure readings are included as required and additional duplex testing as needed. Limited examinations for reoccurring indications may be performed as noted.  ABI Findings: +---------+------------------+-----+---------+--------+ Right    Rt Pressure (mmHg)IndexWaveform Comment   +---------+------------------+-----+---------+--------+ Brachial 178                                      +---------+------------------+-----+---------+--------+ ATA      217               1.22 triphasic         +---------+------------------+-----+---------+--------+ PTA      244               1.37 triphasic         +---------+------------------+-----+---------+--------+ Great Toe132               0.74 Normal            +---------+------------------+-----+---------+--------+ +---------+------------------+-----+---------+-------+ Left     Lt Pressure (mmHg)IndexWaveform Comment +---------+------------------+-----+---------+-------+ Brachial 175                                     +---------+------------------+-----+---------+-------+ ATA      191               1.07 triphasic        +---------+------------------+-----+---------+-------+ PTA      214               1.20 triphasic        +---------+------------------+-----+---------+-------+ Great Toe142               0.80 Normal           +---------+------------------+-----+---------+-------+ +-------+-----------+-----------+------------+------------+ ABI/TBIToday's ABIToday's TBIPrevious ABIPrevious TBI +-------+-----------+-----------+------------+------------+ Right  1.37       .74        1.45        .71          +-------+-----------+-----------+------------+------------+ Left   1.20       .80        1.16        .69          +-------+-----------+-----------+------------+------------+ Bilateral ABIs appear essentially unchanged compared to prior study on 04/01/2020. Bilateral TBIs appear essentially unchanged compared to prior study on 04/01/2020.  Summary: Right: Resting right ankle-brachial index indicates noncompressible right lower extremity arteries. The right toe-brachial index is normal. Left: Resting left ankle-brachial index is within normal range. No evidence of significant  left lower  extremity arterial disease. The left toe-brachial index is normal. *See table(s) above for measurements and observations.  Electronically signed by [name] MD on 11/06/2020 at 11:59:26 AM.   Final      Assessment/Plan HTN (hypertension), benign blood pressure control important in reducing the progression of atherosclerotic disease. On appropriate oral medications.   DM type 2 with diabetic peripheral neuropathy (HCC) blood glucose control important in reducing the progression of atherosclerotic disease. Also, involved in wound healing. On appropriate medications.  HLD (hyperlipidemia) lipid control important in reducing the progression of atherosclerotic disease. Continue statin therapy  Bilateral carotid artery disease ([place]) He has a known history of carotid disease and we performed a carotid duplex today.  This demonstrates mild carotid artery stenosis in the 1 to 39% range bilaterally.  Continue current medical regimen.  We can recheck this every other year.  Chronic mesenteric ischemia (HCC) Duplex today shows some mildly elevated velocities in the celiac artery consistent with some degree of stenosis.  His SMA velocities are normal and the previously placed stent appears to be patent with good flow.  Symptoms remain resolved after intervention last year.  We will see him back in 6 months.  Continue current medical regimen.    [name], MD  11/27/2020 9:52 AM    This note was created with Dragon medical transcription system.  Any errors from dictation are purely unintentional

## 2020-11-27 NOTE — Assessment & Plan Note (Signed)
Duplex today shows some mildly elevated velocities in the celiac artery consistent with some degree of stenosis.  His SMA velocities are normal and the previously placed stent appears to be patent with good flow.  Symptoms remain resolved after intervention last year.  We will see him back in 6 months.  Continue current medical regimen.

## 2020-11-30 DIAGNOSIS — I129 Hypertensive chronic kidney disease with stage 1 through stage 4 chronic kidney disease, or unspecified chronic kidney disease: Secondary | ICD-10-CM | POA: Diagnosis not present

## 2020-11-30 DIAGNOSIS — R809 Proteinuria, unspecified: Secondary | ICD-10-CM | POA: Diagnosis not present

## 2020-11-30 DIAGNOSIS — E1122 Type 2 diabetes mellitus with diabetic chronic kidney disease: Secondary | ICD-10-CM | POA: Diagnosis not present

## 2020-11-30 DIAGNOSIS — D631 Anemia in chronic kidney disease: Secondary | ICD-10-CM | POA: Diagnosis not present

## 2020-11-30 DIAGNOSIS — N1831 Chronic kidney disease, stage 3a: Secondary | ICD-10-CM | POA: Diagnosis not present

## 2020-11-30 DIAGNOSIS — E875 Hyperkalemia: Secondary | ICD-10-CM | POA: Diagnosis not present

## 2020-12-02 DIAGNOSIS — I10 Essential (primary) hypertension: Secondary | ICD-10-CM | POA: Diagnosis not present

## 2020-12-02 DIAGNOSIS — E113393 Type 2 diabetes mellitus with moderate nonproliferative diabetic retinopathy without macular edema, bilateral: Secondary | ICD-10-CM | POA: Diagnosis not present

## 2020-12-02 DIAGNOSIS — E1159 Type 2 diabetes mellitus with other circulatory complications: Secondary | ICD-10-CM | POA: Diagnosis not present

## 2020-12-02 DIAGNOSIS — Z794 Long term (current) use of insulin: Secondary | ICD-10-CM | POA: Diagnosis not present

## 2020-12-02 DIAGNOSIS — E1142 Type 2 diabetes mellitus with diabetic polyneuropathy: Secondary | ICD-10-CM | POA: Diagnosis not present

## 2020-12-02 DIAGNOSIS — E1165 Type 2 diabetes mellitus with hyperglycemia: Secondary | ICD-10-CM | POA: Diagnosis not present

## 2020-12-07 ENCOUNTER — Ambulatory Visit: Payer: Medicare HMO

## 2020-12-07 ENCOUNTER — Encounter: Payer: Medicare HMO | Admitting: Speech Pathology

## 2020-12-07 ENCOUNTER — Encounter: Payer: Medicare HMO | Admitting: Occupational Therapy

## 2020-12-09 ENCOUNTER — Encounter: Payer: Medicare HMO | Admitting: Speech Pathology

## 2020-12-09 DIAGNOSIS — E113312 Type 2 diabetes mellitus with moderate nonproliferative diabetic retinopathy with macular edema, left eye: Secondary | ICD-10-CM | POA: Diagnosis not present

## 2020-12-30 DIAGNOSIS — E1122 Type 2 diabetes mellitus with diabetic chronic kidney disease: Secondary | ICD-10-CM | POA: Diagnosis not present

## 2020-12-30 DIAGNOSIS — N1831 Chronic kidney disease, stage 3a: Secondary | ICD-10-CM | POA: Diagnosis not present

## 2020-12-30 DIAGNOSIS — D631 Anemia in chronic kidney disease: Secondary | ICD-10-CM | POA: Diagnosis not present

## 2020-12-30 DIAGNOSIS — E875 Hyperkalemia: Secondary | ICD-10-CM | POA: Diagnosis not present

## 2020-12-30 DIAGNOSIS — R809 Proteinuria, unspecified: Secondary | ICD-10-CM | POA: Diagnosis not present

## 2020-12-30 DIAGNOSIS — I129 Hypertensive chronic kidney disease with stage 1 through stage 4 chronic kidney disease, or unspecified chronic kidney disease: Secondary | ICD-10-CM | POA: Diagnosis not present

## 2020-12-31 DIAGNOSIS — I2510 Atherosclerotic heart disease of native coronary artery without angina pectoris: Secondary | ICD-10-CM | POA: Diagnosis not present

## 2020-12-31 DIAGNOSIS — E782 Mixed hyperlipidemia: Secondary | ICD-10-CM | POA: Diagnosis not present

## 2020-12-31 DIAGNOSIS — R0602 Shortness of breath: Secondary | ICD-10-CM | POA: Diagnosis not present

## 2020-12-31 DIAGNOSIS — I10 Essential (primary) hypertension: Secondary | ICD-10-CM | POA: Diagnosis not present

## 2021-01-01 DIAGNOSIS — I700 Atherosclerosis of aorta: Secondary | ICD-10-CM | POA: Diagnosis not present

## 2021-01-01 DIAGNOSIS — E1142 Type 2 diabetes mellitus with diabetic polyneuropathy: Secondary | ICD-10-CM | POA: Diagnosis not present

## 2021-01-01 DIAGNOSIS — F325 Major depressive disorder, single episode, in full remission: Secondary | ICD-10-CM | POA: Diagnosis not present

## 2021-01-01 DIAGNOSIS — Z0000 Encounter for general adult medical examination without abnormal findings: Secondary | ICD-10-CM | POA: Diagnosis not present

## 2021-01-01 DIAGNOSIS — N183 Chronic kidney disease, stage 3 unspecified: Secondary | ICD-10-CM | POA: Diagnosis not present

## 2021-01-01 DIAGNOSIS — I779 Disorder of arteries and arterioles, unspecified: Secondary | ICD-10-CM | POA: Diagnosis not present

## 2021-01-01 DIAGNOSIS — Z794 Long term (current) use of insulin: Secondary | ICD-10-CM | POA: Diagnosis not present

## 2021-01-01 DIAGNOSIS — E1122 Type 2 diabetes mellitus with diabetic chronic kidney disease: Secondary | ICD-10-CM | POA: Diagnosis not present

## 2021-01-01 DIAGNOSIS — E113393 Type 2 diabetes mellitus with moderate nonproliferative diabetic retinopathy without macular edema, bilateral: Secondary | ICD-10-CM | POA: Diagnosis not present

## 2021-01-14 ENCOUNTER — Other Ambulatory Visit: Payer: Self-pay

## 2021-01-14 NOTE — Patient Instructions (Signed)
Goals    . Monitor and Manage My Blood Sugar     Timeframe:  Long-Range Goal Priority:  High Start Date:   01/14/21                          Expected End Date: 09/11/21                      Follow Up Date 05/12/21   - check blood sugar at prescribed times - take the blood sugar log to all doctor visits    Why is this important?   Checking your blood sugar at home helps to keep it from getting very high or very low.  Writing the results in a diary or log helps the doctor know how to care for you.  Your blood sugar log should have the time, date and the results.  Also, write down the amount of insulin or other medicine that you take.  Other information, like what you ate, exercise done and how you were feeling, will also be helpful.     Notes: 01/14/21 Continue to monitor sugars and limit carbohydrates and sweets

## 2021-01-14 NOTE — Patient Outreach (Signed)
[facility]) Care Management  [place]  01/14/2021   Patrick Turner 20-Jun-1946 [id number]  Subjective: Telephone call to patient for disease management follow up. Patient reports he is doing good. Patient A1c noted to be 8.4. discussed diabetes management and reducing A1c to prevent disease progression. No concerns.    Objective:   Encounter Medications:  Outpatient Encounter Medications as of 01/14/2021  Medication Sig  . ACCU-CHEK AVIVA PLUS test strip Use to test blood sugar twice daily  . Accu-Chek Softclix Lancets lancets Use to test blood sugar twice daily  . acetaminophen (TYLENOL) 325 MG tablet Take 2 tablets (650 mg total) by mouth every 6 (six) hours as needed for mild pain (or Fever >/= 101).  Marland Kitchen ascorbic acid (VITAMIN C) 500 MG tablet Take by mouth.  Marland Kitchen aspirin 81 MG tablet Take 1 tablet (81 mg total) by mouth daily.  Marland Kitchen atorvastatin (LIPITOR) 80 MG tablet Take 80 mg by mouth at bedtime.  . carvedilol (COREG) 6.25 MG tablet Take 6.25 mg by mouth 2 (two) times daily with a meal.  . clindamycin (CLEOCIN) 300 MG capsule Take by mouth.  . clonazePAM (KLONOPIN) 1 MG tablet Take 0.5 mg by mouth at bedtime as needed for anxiety.   . clopidogrel (PLAVIX) 75 MG tablet Take 1 tablet (75 mg total) by mouth daily. (Patient not taking: No sig reported)  . gabapentin (NEURONTIN) 400 MG capsule Take 800 mg by mouth at bedtime.   . hydrALAZINE (APRESOLINE) 25 MG tablet Take 25 mg by mouth 2 (two) times daily.  . insulin detemir (LEVEMIR) 100 UNIT/ML injection Inject 15 Units into the skin daily.  . iron polysaccharides (NIFEREX) 150 MG capsule Take 150 mg by mouth daily.  Marland Kitchen lisinopril (ZESTRIL) 10 MG tablet Take 10 mg by mouth daily.   . metFORMIN (GLUCOPHAGE-XR) 500 MG 24 hr tablet Take 500 mg by mouth 2 (two) times daily with a meal. 2 tablets twice daily with meals  . metoprolol tartrate (LOPRESSOR) 25 MG tablet Take 0.5 tablets (12.5 mg total) by mouth 2 (two)  times daily. (Patient taking differently: Take 25 mg by mouth at bedtime.)  . Omega-3 Fatty Acids (FISH OIL) 1000 MG CAPS Take 1,000 mg by mouth 2 (two) times daily.   Marland Kitchen OZEMPIC, 0.25 OR 0.5 MG/DOSE, 2 MG/1.5ML SOPN Inject 1 Dose into the skin once a week. Wednesdays  . pantoprazole (PROTONIX) 40 MG tablet Take 40 mg by mouth daily.  Marland Kitchen PARoxetine (PAXIL) 40 MG tablet Take 40 mg by mouth daily.   . pioglitazone (ACTOS) 15 MG tablet Take 15 mg by mouth daily.   . QUEtiapine (SEROQUEL) 100 MG tablet Take 100 mg by mouth at bedtime.  . ranolazine (RANEXA) 500 MG 12 hr tablet Take 1 tablet by mouth 2 (two) times daily.  Marland Kitchen torsemide (DEMADEX) 20 MG tablet Take 20 mg by mouth daily.  . vitamin B-12 (CYANOCOBALAMIN) 1000 MCG tablet Take 1,000 mcg by mouth daily.  . Vitamin D, Ergocalciferol, (DRISDOL) 1.25 MG (50000 UNIT) CAPS capsule    No facility-administered encounter medications on file as of 01/14/2021.    Functional Status:  In your present state of health, do you have any difficulty performing the following activities: 01/14/2021  Hearing? Y  Comment wears hearing aide  Vision? N  Difficulty concentrating or making decisions? N  Walking or climbing stairs? Y  Comment fall risk, uses cane/walker  Dressing or bathing? N  Doing errands, shopping? N  Conservation officer, nature and  eating ? N  Using the Toilet? N  In the past six months, have you accidently leaked urine? N  Do you have problems with loss of bowel control? N  Managing your Medications? N  Managing your Finances? N  Housekeeping or managing your Housekeeping? Y  Comment wife assist  Some recent data might be hidden    Fall/Depression Screening: Fall Risk  01/14/2021 10/11/2019  Falls in the past year? 0 1  Number falls in past yr: - 1  Injury with Fall? - 0  Risk for fall due to : - History of fall(s);Impaired balance/gait  Follow up - Falls prevention discussed   PHQ 2/9 Scores 10/11/2019  PHQ - 2 Score 0    Assessment:  Goals  Addressed            This Visit's Progress   . COMPLETED: Lifestyle Change       Follow Up Date 08/27/20   - ask questions to understand - learn about high blood pressure    Why is this important?   The changes that you are asked to make may be hard to do.  This is especially true when the changes are life-long.  Knowing why it is important to you is the first step.  Working on the change with your family or support person helps you not feel alone.  Reward yourself and family or support person when goals are met. This can be an activity you choose like bowling, hiking, biking, swimming or shooting hoops.     Notes:     Marland Kitchen Monitor and Manage My Blood Sugar   On track    Timeframe:  Long-Range Goal Priority:  High Start Date:   01/14/21                          Expected End Date: 09/11/21                      Follow Up Date 05/12/21   - check blood sugar at prescribed times - take the blood sugar log to all doctor visits    Why is this important?   Checking your blood sugar at home helps to keep it from getting very high or very low.  Writing the results in a diary or log helps the doctor know how to care for you.  Your blood sugar log should have the time, date and the results.  Also, write down the amount of insulin or other medicine that you take.  Other information, like what you ate, exercise done and how you were feeling, will also be helpful.     Notes: 01/14/21 Continue to monitor sugars and limit carbohydrates and sweets    . COMPLETED: Obtain Eye Exam       Follow Up Date 08/27/20   - keep appointment with eye doctor    Why is this important?   Eye check-ups are important when you have diabetes.  Vision loss can be prevented.    Notes: Completed 08/19/20    . COMPLETED: Perform Foot Care       Follow Up Date 08/27/20   - check feet daily for cuts, sores or redness - keep feet up while sitting - wash and dry feet carefully every day - wear comfortable, cotton  socks - wear comfortable, well-fitting shoes    Why is this important?   Good foot care is very important when you have diabetes.  There are many things you can do to keep your feet healthy and catch a problem early.    Notes:     . COMPLETED: Set My Target A1C       Follow Up Date 08/27/20   - set target A1C    Why is this important?   Your target A1C is decided together by you and your doctor.  It is based on several things like your age and other health issues.    Notes: 7.5 target    . COMPLETED: Track and Manage My Blood Pressure       Follow Up Date 08/27/20   - check blood pressure daily - choose a place to take my blood pressure (home, clinic or office, retail store) - write blood pressure results in a log or diary    Why is this important?   You won't feel high blood pressure, but it can still hurt your blood vessels.  High blood pressure can cause heart or kidney problems. It can also cause a stroke.  Making lifestyle changes like losing a little weight or eating less salt will help.  Checking your blood pressure at home and at different times of the day can help to control blood pressure.  If the doctor prescribes medicine remember to take it the way the doctor ordered.  Call the office if you cannot afford the medicine or if there are questions about it.     Notes: Take new bp monitor to cardiology visit to compare reading at visit  Refocus to diabetes management.       Plan: RN CM will contact in August.  Follow-up:  Patient agrees to Care Plan and Follow-up.   Jone Baseman, RN, MSN [place] Management Care Management Coordinator Direct Line [phone] Cell [phone] Toll Free: [phone]  Fax: [phone]

## 2021-01-18 DIAGNOSIS — F323 Major depressive disorder, single episode, severe with psychotic features: Secondary | ICD-10-CM | POA: Diagnosis not present

## 2021-01-20 DIAGNOSIS — E113313 Type 2 diabetes mellitus with moderate nonproliferative diabetic retinopathy with macular edema, bilateral: Secondary | ICD-10-CM | POA: Diagnosis not present

## 2021-01-20 DIAGNOSIS — E113311 Type 2 diabetes mellitus with moderate nonproliferative diabetic retinopathy with macular edema, right eye: Secondary | ICD-10-CM | POA: Diagnosis not present

## 2021-01-20 DIAGNOSIS — E113312 Type 2 diabetes mellitus with moderate nonproliferative diabetic retinopathy with macular edema, left eye: Secondary | ICD-10-CM | POA: Diagnosis not present

## 2021-01-21 ENCOUNTER — Emergency Department

## 2021-01-21 ENCOUNTER — Other Ambulatory Visit: Payer: Self-pay

## 2021-01-21 ENCOUNTER — Emergency Department
Admission: EM | Admit: 2021-01-21 | Discharge: 2021-01-21 | Disposition: A | Discharge status: Home / Self Care | Attending: Emergency Medicine | Admitting: Emergency Medicine

## 2021-01-21 ENCOUNTER — Encounter: Payer: Self-pay | Admitting: *Deleted

## 2021-01-21 DIAGNOSIS — G4489 Other headache syndrome: Secondary | ICD-10-CM | POA: Diagnosis not present

## 2021-01-21 DIAGNOSIS — E1122 Type 2 diabetes mellitus with diabetic chronic kidney disease: Secondary | ICD-10-CM | POA: Diagnosis not present

## 2021-01-21 DIAGNOSIS — Z96651 Presence of right artificial knee joint: Secondary | ICD-10-CM | POA: Insufficient documentation

## 2021-01-21 DIAGNOSIS — Z7984 Long term (current) use of oral hypoglycemic drugs: Secondary | ICD-10-CM | POA: Insufficient documentation

## 2021-01-21 DIAGNOSIS — N1831 Chronic kidney disease, stage 3a: Secondary | ICD-10-CM | POA: Insufficient documentation

## 2021-01-21 DIAGNOSIS — Z7982 Long term (current) use of aspirin: Secondary | ICD-10-CM | POA: Insufficient documentation

## 2021-01-21 DIAGNOSIS — I129 Hypertensive chronic kidney disease with stage 1 through stage 4 chronic kidney disease, or unspecified chronic kidney disease: Secondary | ICD-10-CM | POA: Insufficient documentation

## 2021-01-21 DIAGNOSIS — S0083XA Contusion of other part of head, initial encounter: Secondary | ICD-10-CM | POA: Insufficient documentation

## 2021-01-21 DIAGNOSIS — I2510 Atherosclerotic heart disease of native coronary artery without angina pectoris: Secondary | ICD-10-CM | POA: Diagnosis not present

## 2021-01-21 DIAGNOSIS — Z79899 Other long term (current) drug therapy: Secondary | ICD-10-CM | POA: Diagnosis not present

## 2021-01-21 DIAGNOSIS — Z794 Long term (current) use of insulin: Secondary | ICD-10-CM | POA: Diagnosis not present

## 2021-01-21 DIAGNOSIS — D631 Anemia in chronic kidney disease: Secondary | ICD-10-CM | POA: Insufficient documentation

## 2021-01-21 DIAGNOSIS — E11319 Type 2 diabetes mellitus with unspecified diabetic retinopathy without macular edema: Secondary | ICD-10-CM | POA: Diagnosis not present

## 2021-01-21 DIAGNOSIS — E1165 Type 2 diabetes mellitus with hyperglycemia: Secondary | ICD-10-CM | POA: Insufficient documentation

## 2021-01-21 DIAGNOSIS — E1140 Type 2 diabetes mellitus with diabetic neuropathy, unspecified: Secondary | ICD-10-CM | POA: Insufficient documentation

## 2021-01-21 DIAGNOSIS — W19XXXA Unspecified fall, initial encounter: Secondary | ICD-10-CM

## 2021-01-21 DIAGNOSIS — I10 Essential (primary) hypertension: Secondary | ICD-10-CM | POA: Diagnosis not present

## 2021-01-21 DIAGNOSIS — S0990XA Unspecified injury of head, initial encounter: Secondary | ICD-10-CM | POA: Diagnosis not present

## 2021-01-21 DIAGNOSIS — W01198A Fall on same level from slipping, tripping and stumbling with subsequent striking against other object, initial encounter: Secondary | ICD-10-CM | POA: Insufficient documentation

## 2021-01-21 DIAGNOSIS — R609 Edema, unspecified: Secondary | ICD-10-CM | POA: Diagnosis not present

## 2021-01-21 LAB — CBC
HCT: 34 % — ABNORMAL LOW (ref 39.0–52.0)
Hemoglobin: 11.1 g/dL — ABNORMAL LOW (ref 13.0–17.0)
MCH: 30.3 pg (ref 26.0–34.0)
MCHC: 32.6 g/dL (ref 30.0–36.0)
MCV: 92.9 fL (ref 80.0–100.0)
Platelets: 250 10*3/uL (ref 150–400)
RBC: 3.66 MIL/uL — ABNORMAL LOW (ref 4.22–5.81)
RDW: 15.6 % — ABNORMAL HIGH (ref 11.5–15.5)
WBC: 5.3 10*3/uL (ref 4.0–10.5)
nRBC: 0 % (ref 0.0–0.2)

## 2021-01-21 LAB — DIFFERENTIAL
Abs Immature Granulocytes: 0.03 10*3/uL (ref 0.00–0.07)
Basophils Absolute: 0 10*3/uL (ref 0.0–0.1)
Basophils Relative: 1 %
Eosinophils Absolute: 0.2 10*3/uL (ref 0.0–0.5)
Eosinophils Relative: 4 %
Immature Granulocytes: 1 %
Lymphocytes Relative: 24 %
Lymphs Abs: 1.3 10*3/uL (ref 0.7–4.0)
Monocytes Absolute: 0.5 10*3/uL (ref 0.1–1.0)
Monocytes Relative: 10 %
Neutro Abs: 3.2 10*3/uL (ref 1.7–7.7)
Neutrophils Relative %: 60 %

## 2021-01-21 LAB — COMPREHENSIVE METABOLIC PANEL
ALT: 15 U/L (ref 0–44)
AST: 21 U/L (ref 15–41)
Albumin: 3.9 g/dL (ref 3.5–5.0)
Alkaline Phosphatase: 79 U/L (ref 38–126)
Anion gap: 11 (ref 5–15)
BUN: 30 mg/dL — ABNORMAL HIGH (ref 8–23)
CO2: 22 mmol/L (ref 22–32)
Calcium: 8.6 mg/dL — ABNORMAL LOW (ref 8.9–10.3)
Chloride: 104 mmol/L (ref 98–111)
Creatinine, Ser: 1.64 mg/dL — ABNORMAL HIGH (ref 0.61–1.24)
GFR, Estimated: 43 mL/min — ABNORMAL LOW (ref >60)
Glucose, Bld: 156 mg/dL — ABNORMAL HIGH (ref 70–99)
Potassium: 4.5 mmol/L (ref 3.5–5.1)
Sodium: 137 mmol/L (ref 135–145)
Total Bilirubin: 0.6 mg/dL (ref 0.3–1.2)
Total Protein: 6.8 g/dL (ref 6.5–8.1)

## 2021-01-21 LAB — CBG MONITORING, ED: Glucose-Capillary: 185 mg/dL — ABNORMAL HIGH (ref 70–99)

## 2021-01-21 MED ORDER — HYDRALAZINE HCL 50 MG PO TABS
25.0000 mg | ORAL_TABLET | Freq: Once | ORAL | Status: AC
  Administered 2021-01-21: 25 mg via ORAL
  Filled 2021-01-21: qty 1

## 2021-01-21 NOTE — ED Triage Notes (Signed)
Pt to ED after multiple falls reported at work. Most recent fall pt reports hitting his head on the left side and now has left sided facial numbness. No droop, left arm or leg weakness or numbness. Pt is oriented x 4 but slow to respond and appears drowsy. Pt leaning tot he left side in the wheelchair.

## 2021-01-21 NOTE — ED Provider Notes (Signed)
[facility] Emergency Department Provider Note   ____________________________________________   Event Date/Time   First MD Initiated Contact with Patient 01/21/21 1919     (approximate)  I have reviewed the triage vital signs and the nursing notes.   HISTORY  Chief Complaint Fall and Altered Mental Status    HPI Patrick Turner is a 75 y.o. male with past medical history of hypertension, diabetes, CAD, and CKD who presents to the ED following fall.  Patient reports that he was at work today when he was reaching to get some cigarettes for a customer.  He then seemed to lose his balance and fell to the side, striking his head on the ground.  He denies losing consciousness but now complains of tingling and pain along the left side of his face and neck.  He does take Plavix, denies any numbness or weakness in his extremities.  He denies any pain is in his extremities, chest, or abdomen.  He has otherwise been feeling well recently with no fevers, cough, chest pain, or shortness of breath.        Past Medical History:  Diagnosis Date  . Altered mental status   . Anemia   . Anxiety   . Aortic atherosclerosis ([place])   . Arthritis   . Coronary artery disease   . CRD (chronic renal disease)   . Depression   . Diabetes mellitus without complication ([place])   . Encephalopathy acute   . GERD (gastroesophageal reflux disease)   . Headache   . Hypercholesteremia   . Hypertension   . Neuropathy   . Severe obesity ([place])   . Sleep apnea   . Sleep terror    per patient this year per patient     Patient Active Problem List   Diagnosis Date Noted  . Anemia in chronic kidney disease 05/28/2020  . Benign hypertensive kidney disease with chronic kidney disease 05/28/2020  . Proteinuria 05/28/2020  . Falls frequently 04/27/2020  . Use of cane as ambulatory aid 04/27/2020  . Numbness and tingling of both legs 02/18/2020  . Primary osteoarthritis of left knee  01/04/2020  . Diabetic retinopathy ([place]) 01/01/2020  . ASCVD (arteriosclerotic cardiovascular disease) 10/16/2019  . Hospitalization within last 30 days 10/16/2019  . Hyperkalemia 10/14/2019  . Pseudoaneurysm of right femoral artery ([place]) 10/14/2019  . AKI (acute kidney injury) ([place]) 10/13/2019  . Unstable angina ([place]) 10/02/2019  . Chest pain 10/01/2019  . Uncontrolled type 2 diabetes mellitus with hyperglycemia, with long-term current use of insulin ([place]) 09/27/2019  . Chronic mesenteric ischemia ([place]) 05/06/2019  . Abdominal pain 05/03/2019  . Acute on chronic renal failure ([place]) 03/23/2019  . AMS (altered mental status) 12/24/2018  . Dizziness 06/30/2018  . HLD (hyperlipidemia) 02/23/2018  . Aortic atherosclerosis ([place]) 11/02/2017  . Healthcare maintenance 12/26/2016  . DM type 2 with diabetic peripheral neuropathy ([place]) 11/09/2016  . Presence of right artificial knee joint 08/14/2016  . Bilateral carotid artery disease ([place]) 07/12/2016  . 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 Acute diarrhea 05/30/2016  . Encephalopathy acute 05/30/2016  . Anemia 05/30/2016  . Acute on chronic renal insufficiency 05/30/2016  . Diarrhea 05/30/2016  . Chronic kidney disease, stage 3a ([place]) 03/30/2015  . Depression, major, in remission ([place]) 08/02/2014  . Severe obesity (BMI 35.0-39.9) with comorbidity ([place]) 07/12/2014  . GERD (gastroesophageal reflux disease) 03/08/2014  . Hyperlipidemia associated with type 2 diabetes mellitus ([place]) 03/08/2014  . 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  Diabetes mellitus with stage 3 chronic kidney disease ([place]) 01/21/2014  . HTN (hypertension), benign 01/21/2014  . OSA (obstructive sleep apnea) 01/21/2014  . Unspecified transient cerebral ischemia 05/29/2012    Past Surgical History:  Procedure Laterality Date  . APPENDECTOMY    . CIRCUMCISION    . COLONOSCOPY    . COLONOSCOPY WITH PROPOFOL N/A 07/10/2018   Procedure: COLONOSCOPY WITH  PROPOFOL;  Surgeon: Lollie Sails, MD;  Location: [facility] ENDOSCOPY;  Service: Endoscopy;  Laterality: N/A;  . COLONOSCOPY WITH PROPOFOL N/A 04/23/2019   Procedure: COLONOSCOPY WITH PROPOFOL;  Surgeon: Lollie Sails, MD;  Location: [facility] ENDOSCOPY;  Service: Endoscopy;  Laterality: N/A;  . CORONARY STENT INTERVENTION N/A 10/03/2019   Procedure: CORONARY STENT INTERVENTION;  Surgeon: Yolonda Kida, MD;  Location: [place] CV LAB;  Service: Cardiovascular;  Laterality: N/A;  . ESOPHAGOGASTRODUODENOSCOPY    . ESOPHAGOGASTRODUODENOSCOPY (EGD) WITH PROPOFOL N/A 04/23/2019   Procedure: ESOPHAGOGASTRODUODENOSCOPY (EGD) WITH PROPOFOL;  Surgeon: Lollie Sails, MD;  Location: [facility] ENDOSCOPY;  Service: Endoscopy;  Laterality: N/A;  . JOINT REPLACEMENT    . 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 KNEE ARTHROSCOPY Right   . LEFT HEART CATH AND CORONARY ANGIOGRAPHY N/A 10/03/2019   Procedure: Left Heart Cath and possible Coronary intervention;  Surgeon: Dionisio David, MD;  Location: [place] CV LAB;  Service: Cardiovascular;  Laterality: N/A;  . TONSILLECTOMY    . VISCERAL ANGIOGRAPHY N/A 05/23/2019   Procedure: VISCERAL ANGIOGRAPHY;  Surgeon: Algernon Huxley, MD;  Location: [place] CV LAB;  Service: Cardiovascular;  Laterality: N/A;  . VISCERAL ANGIOGRAPHY N/A 11/04/2019   Procedure: VISCERAL ANGIOGRAPHY;  Surgeon: Algernon Huxley, MD;  Location: [place] CV LAB;  Service: Cardiovascular;  Laterality: N/A;    Prior to Admission medications   Medication Sig Start Date End Date Taking? Authorizing Provider  ACCU-CHEK AVIVA PLUS test strip Use to test blood sugar twice daily 10/13/19   [provider]  Accu-Chek Softclix Lancets lancets Use to test blood sugar twice daily 10/13/19   [provider]  acetaminophen  (TYLENOL) 325 MG tablet Take 2 tablets (650 mg total) by mouth every 6 (six) hours as needed for mild pain (or Fever >/= 101). 10/14/19   Thornell Mule, MD  ascorbic acid (VITAMIN C) 500 MG tablet Take by mouth.    [provider]  aspirin 81 MG tablet Take 1 tablet (81 mg total) by mouth daily. 02/23/18   Henreitta Leber, MD  atorvastatin (LIPITOR) 80 MG tablet Take 80 mg by mouth at bedtime. 12/14/15   [provider]  carvedilol (COREG) 6.25 MG tablet Take 6.25 mg by mouth 2 (two) times daily with a meal.    [provider]  clindamycin (CLEOCIN) 300 MG capsule Take by mouth. 01/22/20   [provider]  clonazePAM (KLONOPIN) 1 MG tablet Take 0.5 mg by mouth at bedtime as needed for anxiety.  12/18/15   [provider]  clopidogrel (PLAVIX) 75 MG tablet Take 1 tablet (75 mg total) by mouth daily. Patient not taking: No sig reported 09/09/19   Kris Hartmann, NP  gabapentin (NEURONTIN) 400 MG capsule Take 800 mg by mouth at bedtime.     [provider]  hydrALAZINE (APRESOLINE) 25 MG tablet Take 25 mg by mouth 2 (two) times daily. 01/08/20   [provider]  insulin detemir (LEVEMIR) 100 UNIT/ML injection Inject 15 Units into the skin daily. 03/04/19   [provider]  iron polysaccharides (NIFEREX) 150 MG capsule Take 150 mg by mouth daily.    [provider]  lisinopril (ZESTRIL) 10 MG tablet Take 10 mg by mouth daily.     [provider]  metFORMIN (GLUCOPHAGE-XR) 500 MG 24 hr tablet Take 500 mg by mouth 2 (two) times daily with a meal. 2 tablets twice daily with meals    [provider]  metoprolol tartrate (LOPRESSOR) 25 MG tablet Take 0.5 tablets (12.5 mg total) by mouth 2 (two) times daily. Patient taking differently: Take 25 mg by mouth at bedtime. 10/04/19   Para Skeans, MD  Omega-3 Fatty Acids (FISH OIL) 1000 MG CAPS Take 1,000 mg by mouth 2 (two) times daily.     [provider]   OZEMPIC, 0.25 OR 0.5 MG/DOSE, 2 MG/1.5ML SOPN Inject 1 Dose into the skin once a week. Wednesdays 03/04/19   [provider]  pantoprazole (PROTONIX) 40 MG tablet Take 40 mg by mouth daily.    [provider]  PARoxetine (PAXIL) 40 MG tablet Take 40 mg by mouth daily.     [provider]  pioglitazone (ACTOS) 15 MG tablet Take 15 mg by mouth daily.     [provider]  QUEtiapine (SEROQUEL) 100 MG tablet Take 100 mg by mouth at bedtime. 01/25/20   [provider]  ranolazine (RANEXA) 500 MG 12 hr tablet Take 1 tablet by mouth 2 (two) times daily. 10/07/19   [provider]  torsemide (DEMADEX) 20 MG tablet Take 20 mg by mouth daily. 10/12/20   [provider]  vitamin B-12 (CYANOCOBALAMIN) 1000 MCG tablet Take 1,000 mcg by mouth daily.    [provider]  Vitamin D, Ergocalciferol, (DRISDOL) 1.25 MG (50000 UNIT) CAPS capsule  03/27/20   [provider]    Allergies Penicillins and Tavist [clemastine]  Family History  Problem Relation Age of Onset  . Diabetes Mother   . Heart disease Mother   . Cancer Father   . COPD Father     Social History Social History   Tobacco Use  . Smoking status: Never Smoker  . Smokeless tobacco: Never Used  Vaping Use  . Vaping Use: Never used  Substance Use Topics  . Alcohol use: Yes    Comment: rare  . Drug use: No    Review of Systems  Constitutional: No fever/chills Eyes: No visual changes. ENT: No sore throat. Cardiovascular: Denies chest pain. Respiratory: Denies shortness of breath. Gastrointestinal: No abdominal pain.  No nausea, no vomiting.  No diarrhea.  No constipation. Genitourinary: Negative for dysuria. Musculoskeletal: Negative for back pain. Skin: Negative for rash. Neurological: Negative for headaches or focal weakness, positive for numbness and tingling.  ____________________________________________   PHYSICAL EXAM:  VITAL SIGNS: ED Triage  Vitals [01/21/21 1835]  Enc Vitals Group     BP (!) 196/68     Pulse Rate 61     Resp 16     Temp 97.6 F (36.4 C)     Temp Source Oral     SpO2 100 %     Weight 198 lb 13.7 oz (90.2 kg)     Height 5\' 5"  (1.651 m)     Head Circumference      Peak Flow      Pain Score 4     Pain Loc  Pain Edu?      Excl. in [place]?     Constitutional: Alert and oriented. Eyes: Conjunctivae are normal. Head: No scalp hematomas or step-offs noted. Nose: No congestion/rhinnorhea. Mouth/Throat: Mucous membranes are moist. Neck: Normal ROM, no midline cervical spine tenderness to palpation. Cardiovascular: Normal rate, regular rhythm. Grossly normal heart sounds. Respiratory: Normal respiratory effort.  No retractions. Lungs CTAB. Gastrointestinal: Soft and nontender. No distention. Genitourinary: deferred Musculoskeletal: No lower extremity tenderness nor edema.  No upper extremity bony tenderness to palpation. Neurologic:  Normal speech and language. No gross focal neurologic deficits are appreciated. Skin:  Skin is warm, dry and intact. No rash noted. Psychiatric: Mood and affect are normal. Speech and behavior are normal.  ____________________________________________   LABS (all labs ordered are listed, but only abnormal results are displayed)  Labs Reviewed  CBC - Abnormal; Notable for the following components:      Result Value   RBC 3.66 (*)    Hemoglobin 11.1 (*)    HCT 34.0 (*)    RDW 15.6 (*)    All other components within normal limits  COMPREHENSIVE METABOLIC PANEL - Abnormal; Notable for the following components:   Glucose, Bld 156 (*)    BUN 30 (*)    Creatinine, Ser 1.64 (*)    Calcium 8.6 (*)    GFR, Estimated 43 (*)    All other components within normal limits  CBG MONITORING, ED - Abnormal; Notable for the following components:   Glucose-Capillary 185 (*)    All other components within normal limits  DIFFERENTIAL    ____________________________________________  EKG  ED ECG REPORT I, Blake Divine, the attending physician, personally viewed and interpreted this ECG.   Date: 01/21/2021  EKG Time: 18:35  Rate: 60  Rhythm: normal sinus rhythm  Axis: Normal  Intervals:none  ST&T Change: none   PROCEDURES  Procedure(s) performed (including Critical Care):  Procedures   ____________________________________________   INITIAL IMPRESSION / ASSESSMENT AND PLAN / ED COURSE       75 year old male with past medical history of hypertension, diabetes, CAD, and CKD who presents to the ED for numbness and tingling along his left face and neck following a fall at work.  EKG shows no evidence of arrhythmia or ischemia and labs are reassuring, renal function stable compared to previous.  CT head reviewed by me and shows no obvious hemorrhage, negative for acute process per radiology.  CT cervical spine is also negative for acute process.  Patient may have some facial contusion contributing to the numbness and tingling along the left side of his face but there does not appear to be any traumatic injury to account for this and no findings to suggest stroke.  He is noted to be hypertensive, but states he has not yet had his evening blood pressure medication, which we will give while he is in the ED.  He is appropriate for discharge home with PCP follow-up, was counseled to return to the ED for new or worsening symptoms.  Patient agrees with plan.      ____________________________________________   FINAL CLINICAL IMPRESSION(S) / ED DIAGNOSES  Final diagnoses:  Fall, initial encounter  Contusion of face, initial encounter     ED Discharge Orders    None       Note:  This document was prepared using Dragon voice recognition software and may include unintentional dictation errors.   Blake Divine, MD 01/21/21 2052

## 2021-01-21 NOTE — ED Triage Notes (Signed)
Pt comes into the ED via [facility] from the [facility] [employer] [employer] where he works and he had a witnessed mechanical fall where he tripped over his feet.  Pt did hit head, no LOC, denies blood thinners.  Pt does have ah/o DM (CBG 209) and HTN.  207/74 BP. Pt able to answer all questions appropriately with EMS.  C/O left knee and left side of face pain.  No bruising or swelling noted.

## 2021-01-21 NOTE — ED Notes (Signed)
Pt to CT

## 2021-01-21 NOTE — ED Notes (Signed)
ED Provider at bedside. 

## 2021-01-21 NOTE — ED Notes (Signed)
Redrew light green top and sent to lab.

## 2021-01-21 NOTE — ED Notes (Signed)
Notified MD of SBP 200s via secure chat.

## 2021-02-05 DIAGNOSIS — I2510 Atherosclerotic heart disease of native coronary artery without angina pectoris: Secondary | ICD-10-CM | POA: Diagnosis not present

## 2021-02-05 DIAGNOSIS — I10 Essential (primary) hypertension: Secondary | ICD-10-CM | POA: Diagnosis not present

## 2021-02-05 DIAGNOSIS — R0602 Shortness of breath: Secondary | ICD-10-CM | POA: Diagnosis not present

## 2021-02-05 DIAGNOSIS — E782 Mixed hyperlipidemia: Secondary | ICD-10-CM | POA: Diagnosis not present

## 2021-02-05 DIAGNOSIS — R55 Syncope and collapse: Secondary | ICD-10-CM | POA: Diagnosis not present

## 2021-02-10 DIAGNOSIS — W1800XA Striking against unspecified object with subsequent fall, initial encounter: Secondary | ICD-10-CM | POA: Diagnosis not present

## 2021-02-10 DIAGNOSIS — S0083XA Contusion of other part of head, initial encounter: Secondary | ICD-10-CM | POA: Diagnosis not present

## 2021-02-10 DIAGNOSIS — F410 Panic disorder [episodic paroxysmal anxiety] without agoraphobia: Secondary | ICD-10-CM | POA: Diagnosis not present

## 2021-02-10 DIAGNOSIS — F331 Major depressive disorder, recurrent, moderate: Secondary | ICD-10-CM | POA: Diagnosis not present

## 2021-03-01 DIAGNOSIS — R55 Syncope and collapse: Secondary | ICD-10-CM | POA: Diagnosis not present

## 2021-03-04 DIAGNOSIS — E782 Mixed hyperlipidemia: Secondary | ICD-10-CM | POA: Diagnosis not present

## 2021-03-04 DIAGNOSIS — Z0100 Encounter for examination of eyes and vision without abnormal findings: Secondary | ICD-10-CM | POA: Diagnosis not present

## 2021-03-04 DIAGNOSIS — E119 Type 2 diabetes mellitus without complications: Secondary | ICD-10-CM | POA: Diagnosis not present

## 2021-03-04 DIAGNOSIS — E7800 Pure hypercholesterolemia, unspecified: Secondary | ICD-10-CM | POA: Diagnosis not present

## 2021-03-04 DIAGNOSIS — I2510 Atherosclerotic heart disease of native coronary artery without angina pectoris: Secondary | ICD-10-CM | POA: Diagnosis not present

## 2021-03-04 DIAGNOSIS — I10 Essential (primary) hypertension: Secondary | ICD-10-CM | POA: Diagnosis not present

## 2021-03-04 DIAGNOSIS — H524 Presbyopia: Secondary | ICD-10-CM | POA: Diagnosis not present

## 2021-03-04 DIAGNOSIS — E139 Other specified diabetes mellitus without complications: Secondary | ICD-10-CM | POA: Diagnosis not present

## 2021-03-09 DIAGNOSIS — E1122 Type 2 diabetes mellitus with diabetic chronic kidney disease: Secondary | ICD-10-CM | POA: Diagnosis not present

## 2021-03-09 DIAGNOSIS — I129 Hypertensive chronic kidney disease with stage 1 through stage 4 chronic kidney disease, or unspecified chronic kidney disease: Secondary | ICD-10-CM | POA: Diagnosis not present

## 2021-03-09 DIAGNOSIS — N183 Chronic kidney disease, stage 3 unspecified: Secondary | ICD-10-CM | POA: Diagnosis not present

## 2021-03-09 DIAGNOSIS — N1831 Chronic kidney disease, stage 3a: Secondary | ICD-10-CM | POA: Diagnosis not present

## 2021-03-09 DIAGNOSIS — I25118 Atherosclerotic heart disease of native coronary artery with other forms of angina pectoris: Secondary | ICD-10-CM | POA: Diagnosis not present

## 2021-03-09 DIAGNOSIS — R82998 Other abnormal findings in urine: Secondary | ICD-10-CM | POA: Diagnosis not present

## 2021-03-09 DIAGNOSIS — E1142 Type 2 diabetes mellitus with diabetic polyneuropathy: Secondary | ICD-10-CM | POA: Diagnosis not present

## 2021-03-09 DIAGNOSIS — I779 Disorder of arteries and arterioles, unspecified: Secondary | ICD-10-CM | POA: Diagnosis not present

## 2021-03-10 DIAGNOSIS — D631 Anemia in chronic kidney disease: Secondary | ICD-10-CM | POA: Diagnosis not present

## 2021-03-10 DIAGNOSIS — E1122 Type 2 diabetes mellitus with diabetic chronic kidney disease: Secondary | ICD-10-CM | POA: Diagnosis not present

## 2021-03-10 DIAGNOSIS — I129 Hypertensive chronic kidney disease with stage 1 through stage 4 chronic kidney disease, or unspecified chronic kidney disease: Secondary | ICD-10-CM | POA: Diagnosis not present

## 2021-03-10 DIAGNOSIS — N1832 Chronic kidney disease, stage 3b: Secondary | ICD-10-CM | POA: Diagnosis not present

## 2021-03-10 DIAGNOSIS — E875 Hyperkalemia: Secondary | ICD-10-CM | POA: Diagnosis not present

## 2021-03-10 DIAGNOSIS — R809 Proteinuria, unspecified: Secondary | ICD-10-CM | POA: Diagnosis not present

## 2021-03-16 DIAGNOSIS — E1159 Type 2 diabetes mellitus with other circulatory complications: Secondary | ICD-10-CM | POA: Diagnosis not present

## 2021-03-16 DIAGNOSIS — I10 Essential (primary) hypertension: Secondary | ICD-10-CM | POA: Diagnosis not present

## 2021-03-16 DIAGNOSIS — Z794 Long term (current) use of insulin: Secondary | ICD-10-CM | POA: Diagnosis not present

## 2021-03-16 DIAGNOSIS — N183 Chronic kidney disease, stage 3 unspecified: Secondary | ICD-10-CM | POA: Diagnosis not present

## 2021-03-16 DIAGNOSIS — E113393 Type 2 diabetes mellitus with moderate nonproliferative diabetic retinopathy without macular edema, bilateral: Secondary | ICD-10-CM | POA: Diagnosis not present

## 2021-03-16 DIAGNOSIS — E1142 Type 2 diabetes mellitus with diabetic polyneuropathy: Secondary | ICD-10-CM | POA: Diagnosis not present

## 2021-03-16 DIAGNOSIS — E1122 Type 2 diabetes mellitus with diabetic chronic kidney disease: Secondary | ICD-10-CM | POA: Diagnosis not present

## 2021-03-17 DIAGNOSIS — F323 Major depressive disorder, single episode, severe with psychotic features: Secondary | ICD-10-CM | POA: Diagnosis not present

## 2021-03-24 DIAGNOSIS — E113312 Type 2 diabetes mellitus with moderate nonproliferative diabetic retinopathy with macular edema, left eye: Secondary | ICD-10-CM | POA: Diagnosis not present

## 2021-04-15 ENCOUNTER — Other Ambulatory Visit: Payer: Self-pay

## 2021-04-15 NOTE — Patient Outreach (Signed)
[name]) Care Management  04/15/2021  Patrick Turner [birth year] [id number]   Telephone call to patient for disease management follow up.   No answer.  HIPAA compliant voice message left.    Plan: If no return call, RN CM will attempt patient again in the month of November.    Jone Baseman, RN, MSN [place] [facility] Care Management Coordinator Direct Line [phone] Cell [phone] Toll Free: [phone]  Fax: [phone]

## 2021-04-16 ENCOUNTER — Ambulatory Visit: Payer: Self-pay

## 2021-04-22 DIAGNOSIS — R194 Change in bowel habit: Secondary | ICD-10-CM | POA: Diagnosis not present

## 2021-04-22 DIAGNOSIS — R198 Other specified symptoms and signs involving the digestive system and abdomen: Secondary | ICD-10-CM | POA: Diagnosis not present

## 2021-04-26 DIAGNOSIS — R194 Change in bowel habit: Secondary | ICD-10-CM | POA: Diagnosis not present

## 2021-04-26 DIAGNOSIS — R198 Other specified symptoms and signs involving the digestive system and abdomen: Secondary | ICD-10-CM | POA: Diagnosis not present

## 2021-04-29 DIAGNOSIS — I2510 Atherosclerotic heart disease of native coronary artery without angina pectoris: Secondary | ICD-10-CM | POA: Diagnosis not present

## 2021-04-29 DIAGNOSIS — I10 Essential (primary) hypertension: Secondary | ICD-10-CM | POA: Diagnosis not present

## 2021-04-29 DIAGNOSIS — E782 Mixed hyperlipidemia: Secondary | ICD-10-CM | POA: Diagnosis not present

## 2021-04-29 DIAGNOSIS — R0602 Shortness of breath: Secondary | ICD-10-CM | POA: Diagnosis not present

## 2021-04-29 DIAGNOSIS — I209 Angina pectoris, unspecified: Secondary | ICD-10-CM | POA: Diagnosis not present

## 2021-05-05 DIAGNOSIS — I700 Atherosclerosis of aorta: Secondary | ICD-10-CM | POA: Diagnosis not present

## 2021-05-05 DIAGNOSIS — I779 Disorder of arteries and arterioles, unspecified: Secondary | ICD-10-CM | POA: Diagnosis not present

## 2021-05-05 DIAGNOSIS — E1122 Type 2 diabetes mellitus with diabetic chronic kidney disease: Secondary | ICD-10-CM | POA: Diagnosis not present

## 2021-05-05 DIAGNOSIS — F325 Major depressive disorder, single episode, in full remission: Secondary | ICD-10-CM | POA: Diagnosis not present

## 2021-05-05 DIAGNOSIS — N183 Chronic kidney disease, stage 3 unspecified: Secondary | ICD-10-CM | POA: Diagnosis not present

## 2021-05-05 DIAGNOSIS — I25118 Atherosclerotic heart disease of native coronary artery with other forms of angina pectoris: Secondary | ICD-10-CM | POA: Diagnosis not present

## 2021-05-13 IMAGING — CR DG SHOULDER 2+V*L*
3 series · 3 of 3 positions shown · non-contrast
Comparison: None.

CLINICAL DATA: Chest pain shoulder pain

EXAM:
LEFT SHOULDER - 2+ VIEW

[shoulder grashey]
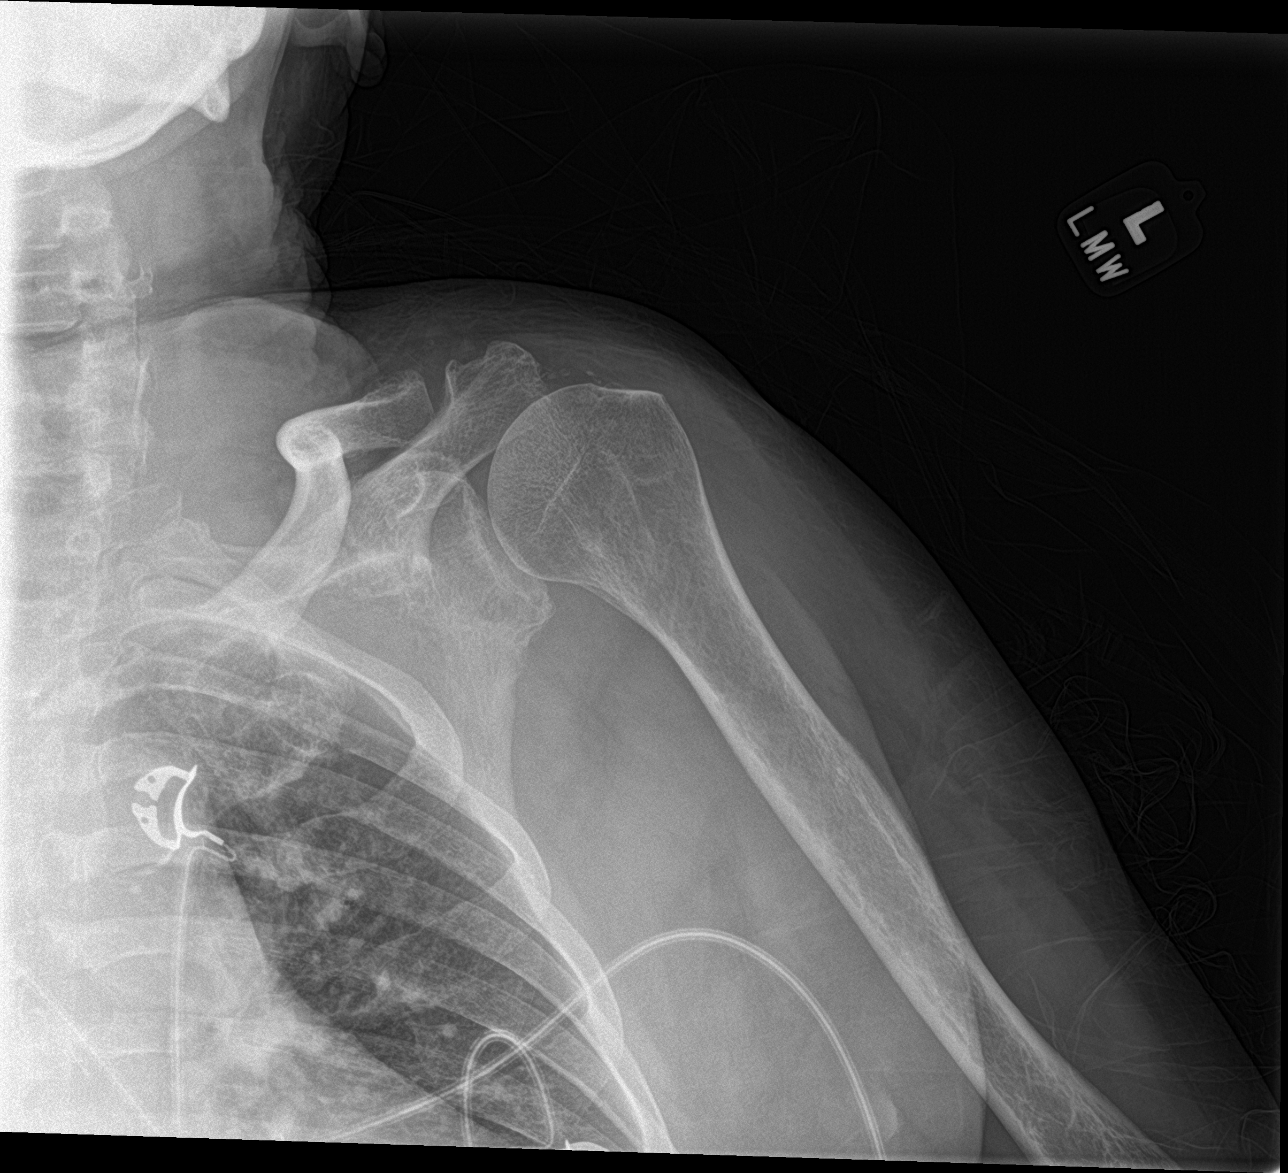

[shoulder y view]
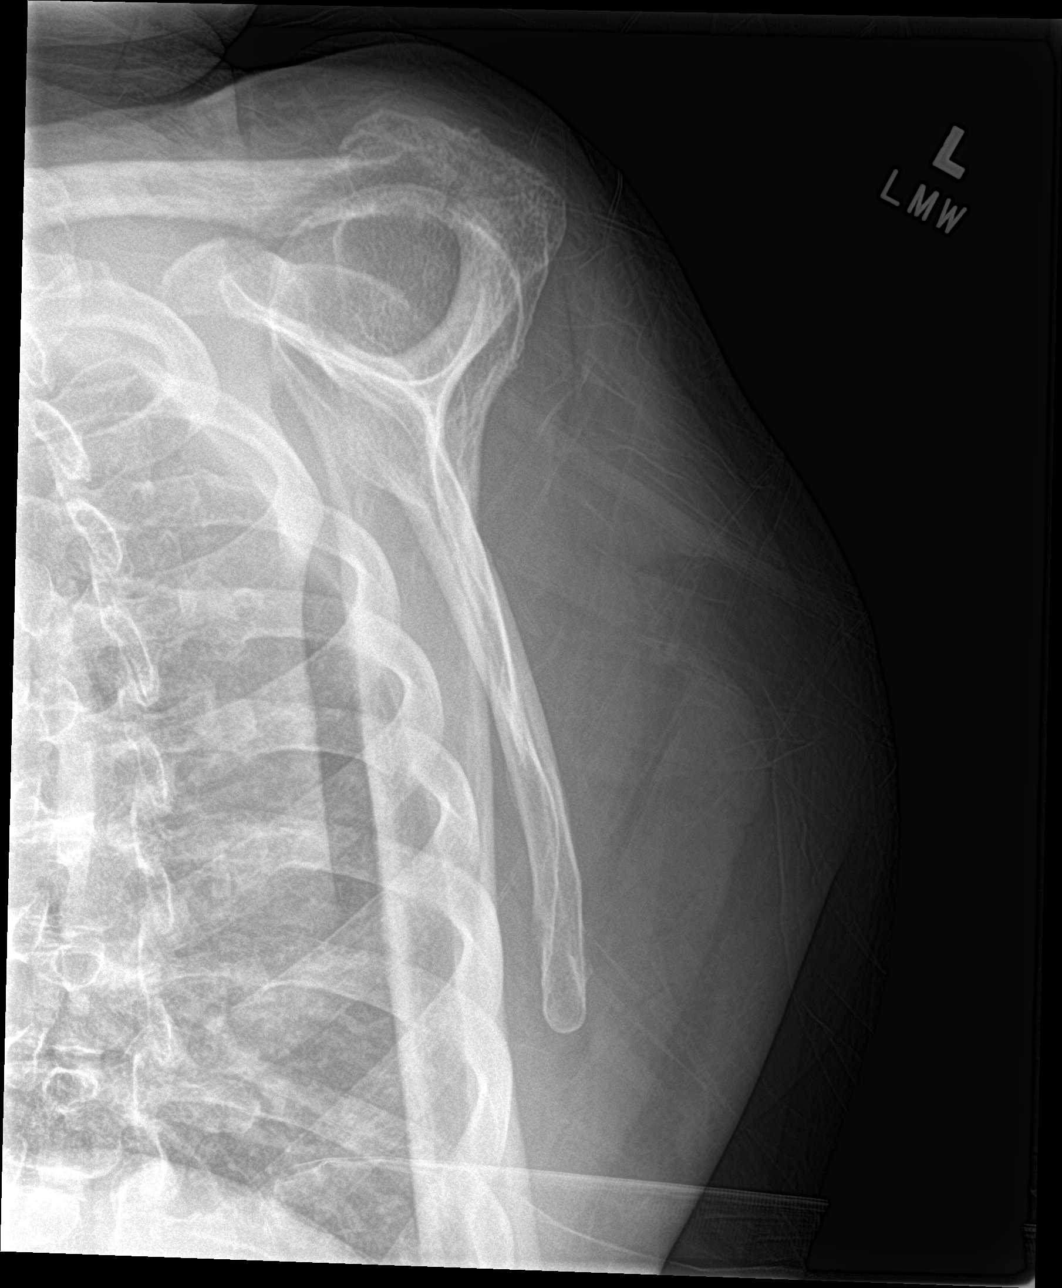

[shoulder ap neutral]
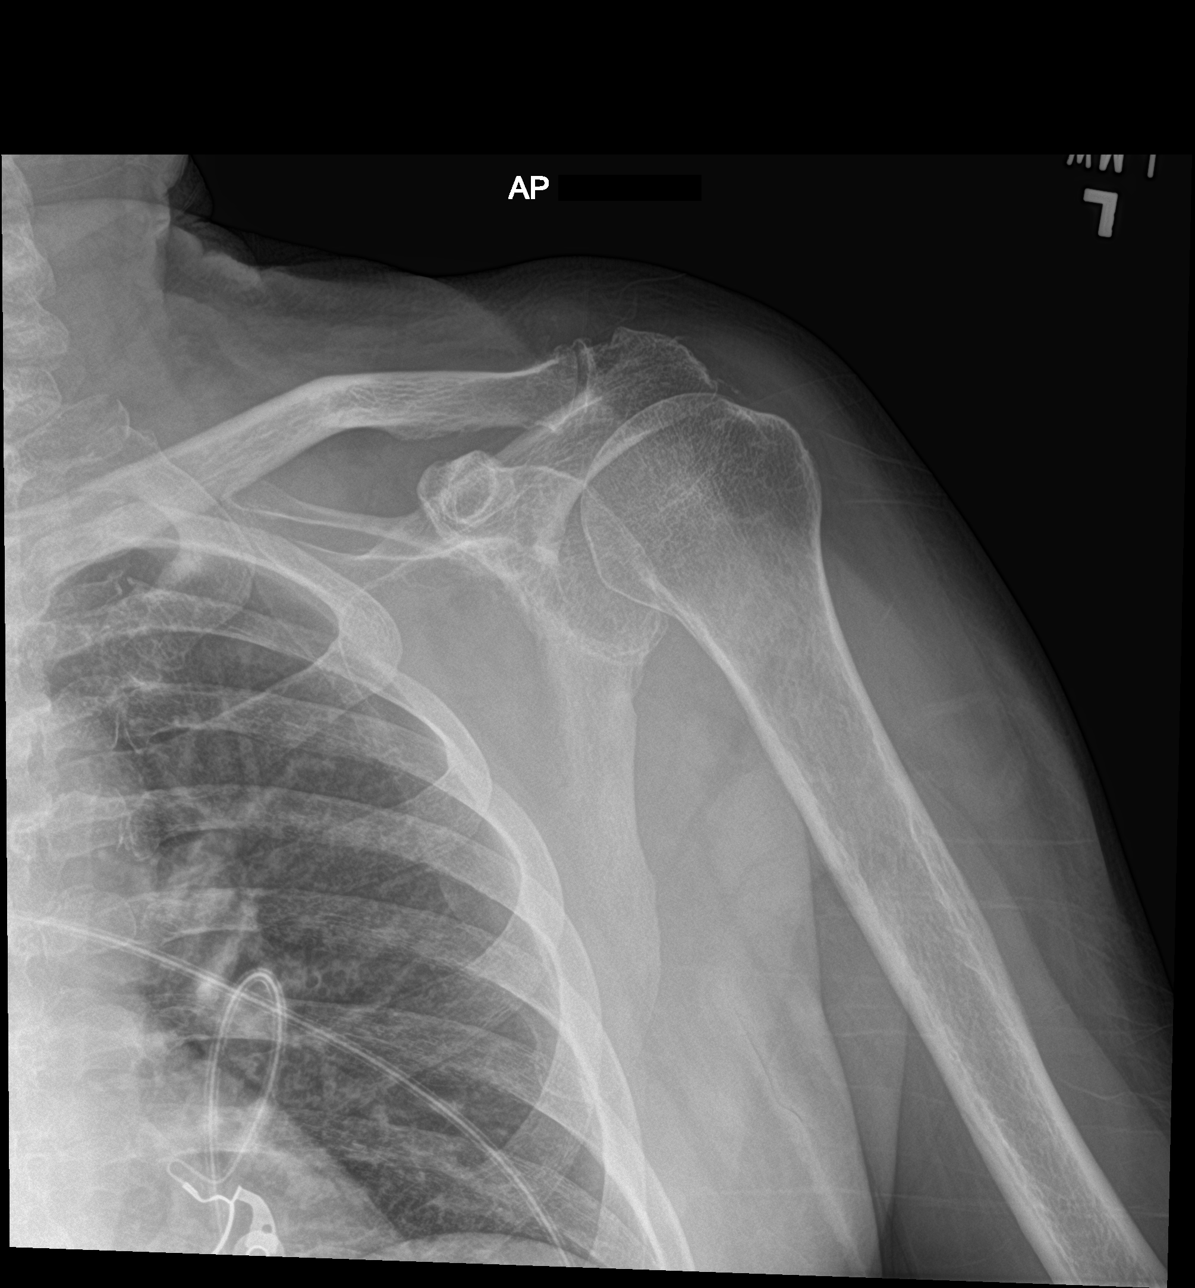

[3 of 3 positions shown; findings below may reference images not displayed]

FINDINGS: No fracture or malalignment. Mild calcific tendinitis. Mild AC joint
degenerative change.
IMPRESSION: No acute osseous abnormality

## 2021-05-13 IMAGING — CT CT CERVICAL SPINE W/O CM
3 of 4 series · 12 of 33 positions shown, 14 images · non-contrast
Comparison: CT 12/24/2018, 11/05/2015

CLINICAL DATA: Fell and hit head

EXAM:
CT HEAD WITHOUT CONTRAST
CT CERVICAL SPINE WITHOUT CONTRAST
TECHNIQUE: Multidetector CT imaging of the head and cervical spine was
performed following the standard protocol without intravenous
contrast. Multiplanar CT image reconstructions of the cervical spine
were also generated.

[Series 4: sagittal bone · sagittal · 0.23mm/px · 5 of 49 slices shown, 6 images]
[im 17/49  bone]
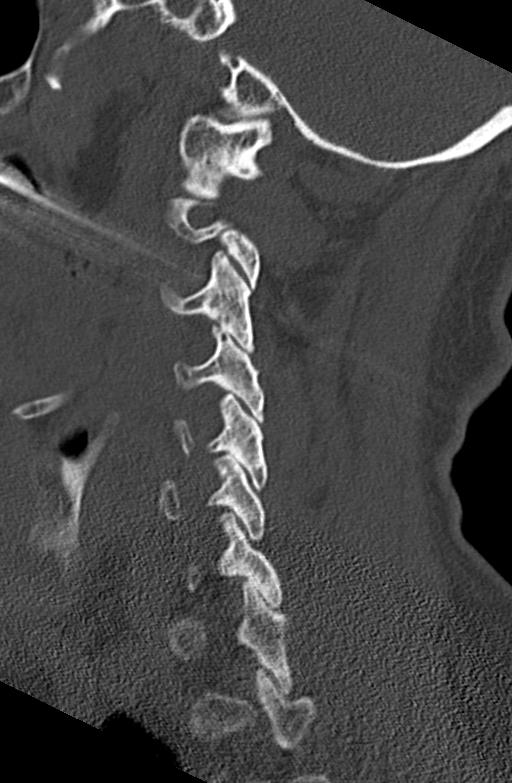
[im 21/49  bone]
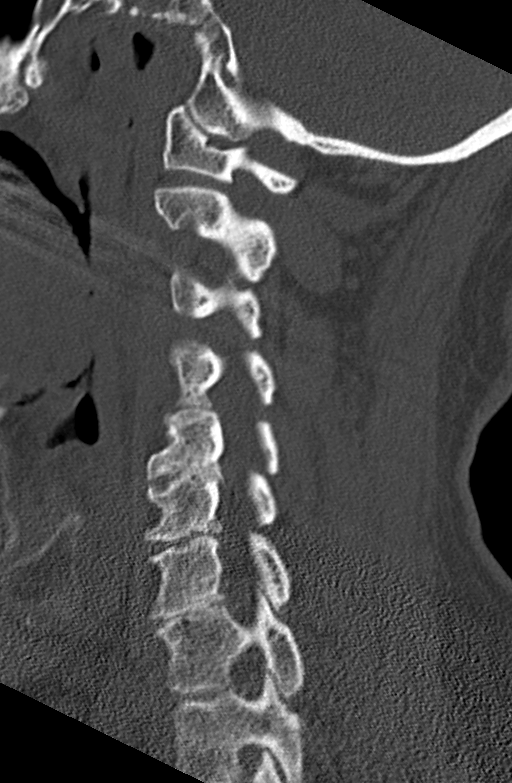
[im 25/49  soft-tissue]
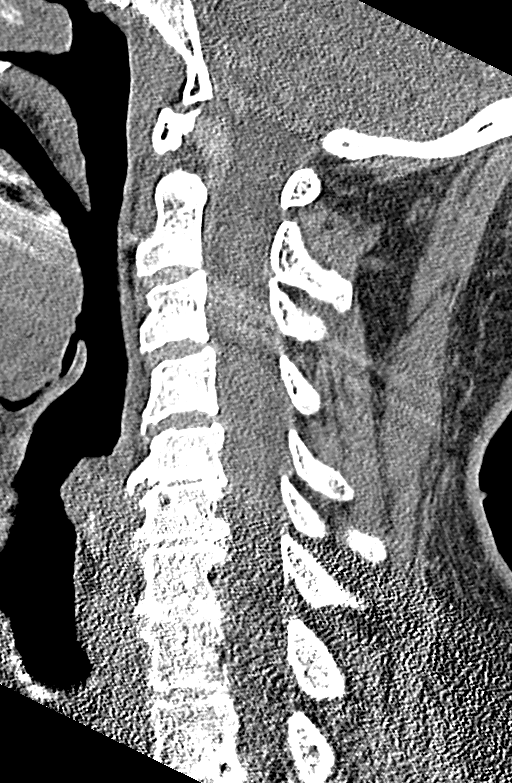
[im 25/49  bone]
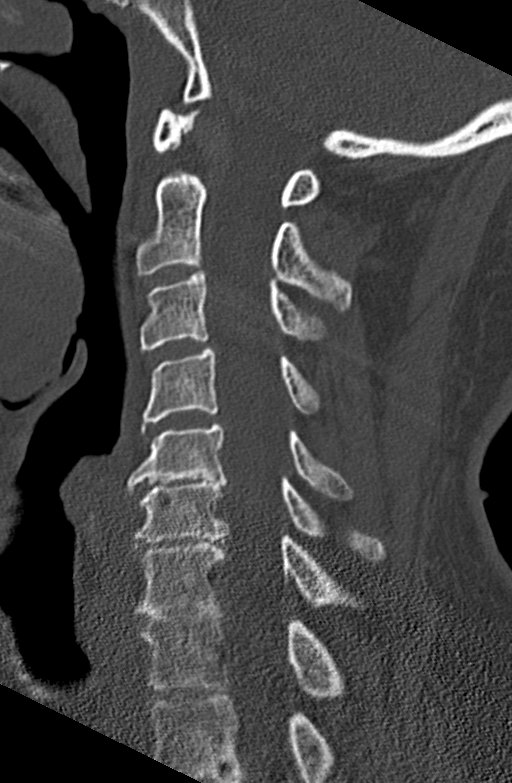
[im 29/49  bone]
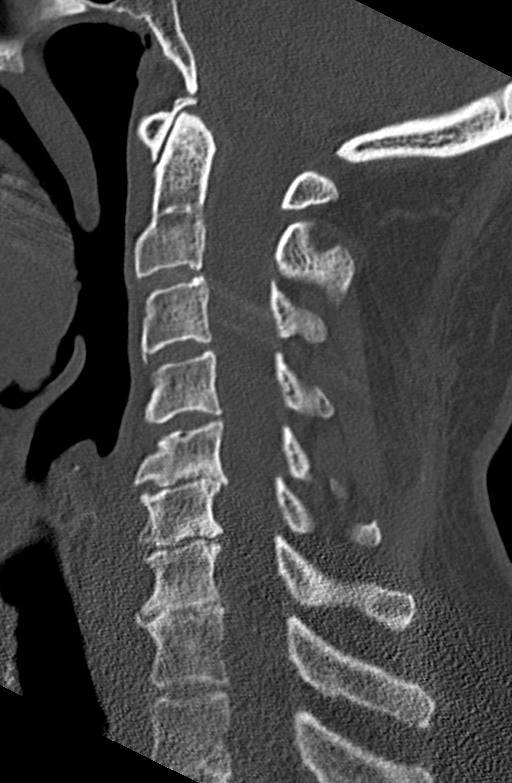
[im 33/49  bone]
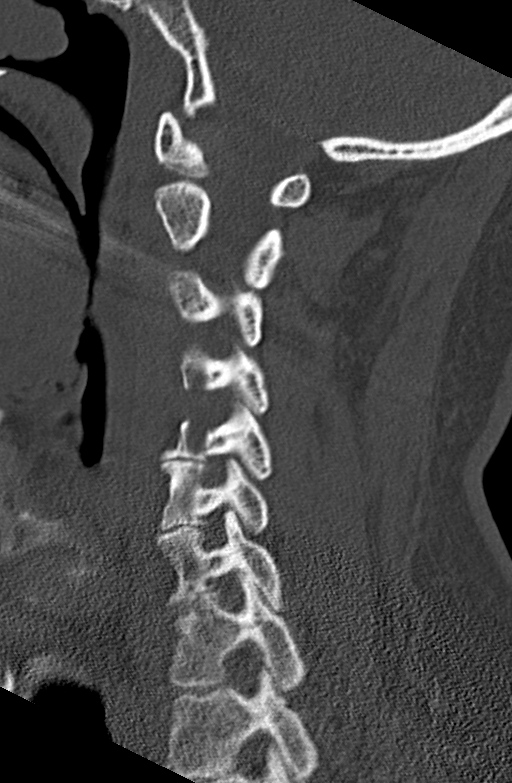

[Series 5: coronal bone · coronal · 0.25mm/px · 3 of 51 slices shown]
[im 11/51  bone]
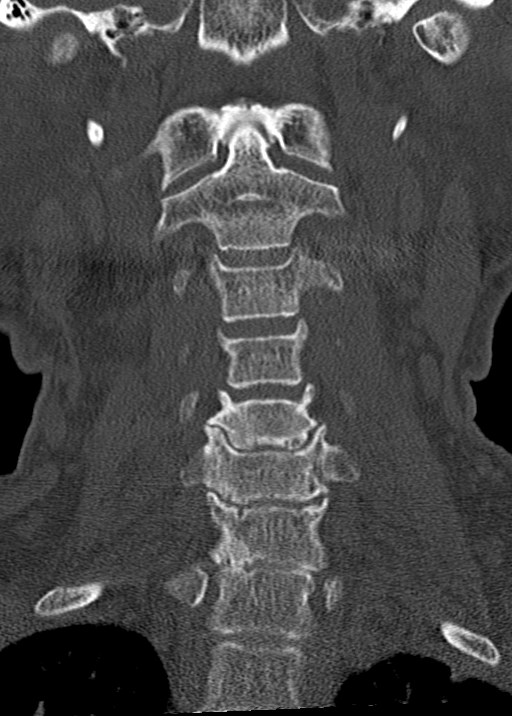
[im 21/51  bone]
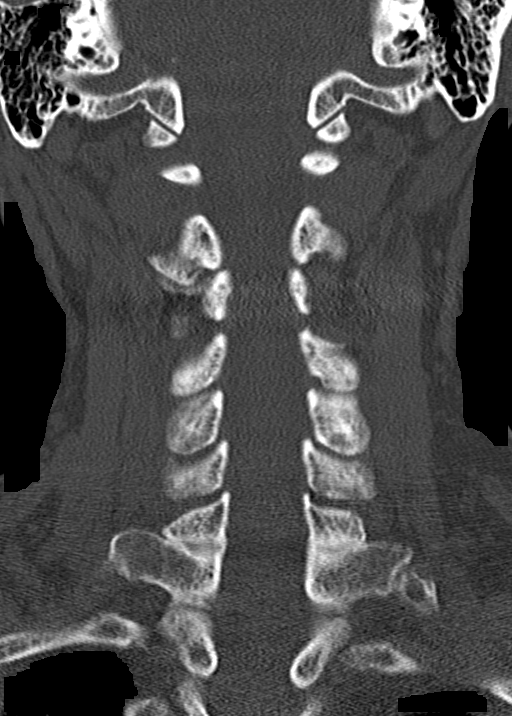
[im 31/51  bone]
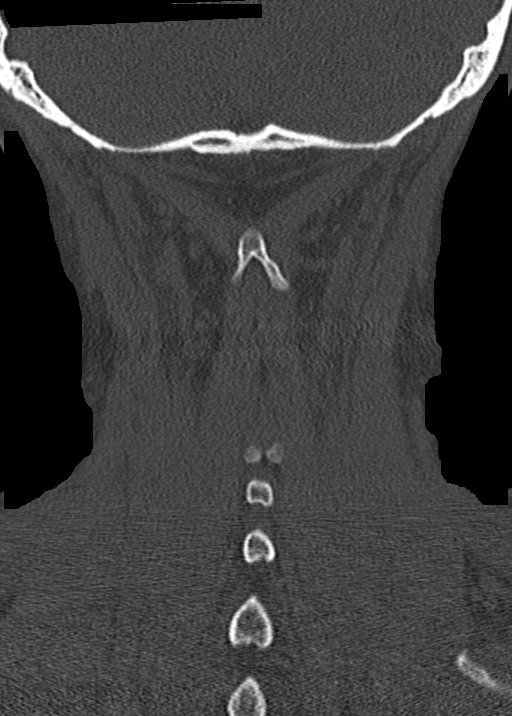

[Series 6: orthogonal bone · axial · 0.20mm/px · z∈[-283,-173]mm · 4 of 90 slices shown, 5 images]
[im 15/90  soft-tissue]
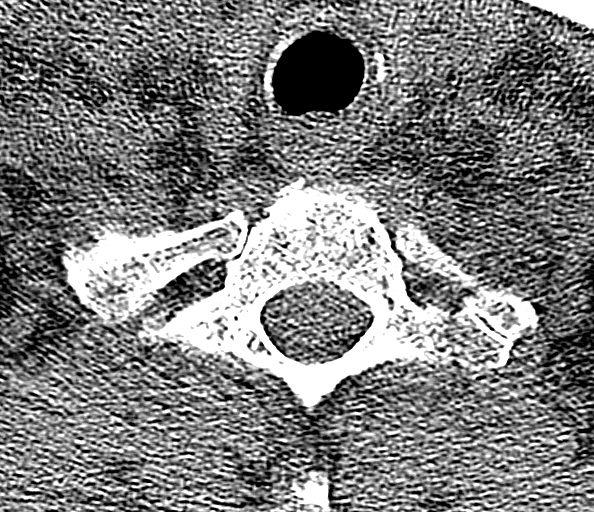
[im 15/90  bone]
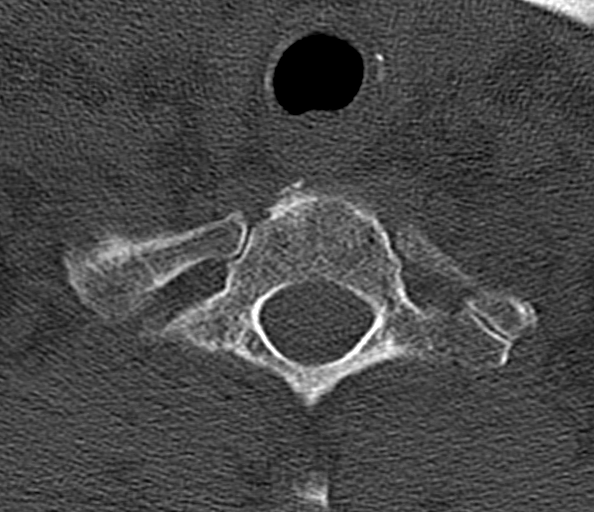
[im 30/90  bone]
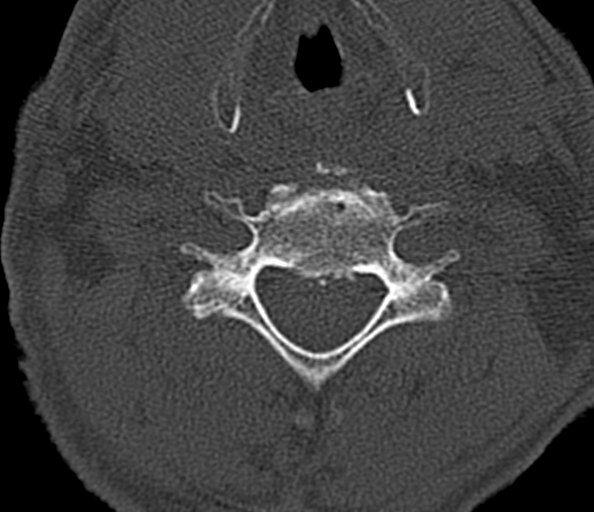
[im 60/90  bone]
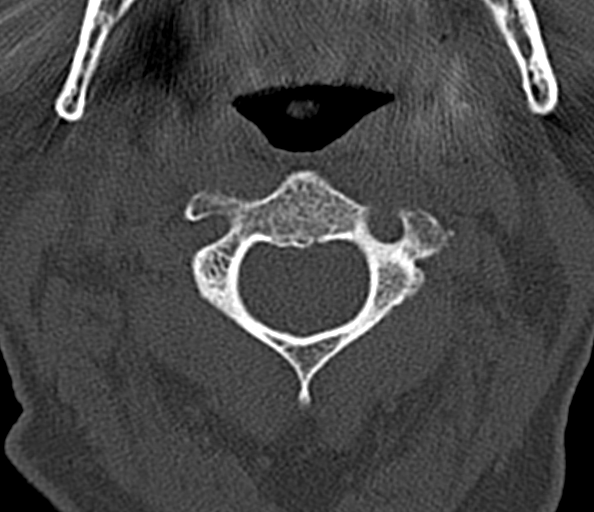
[im 75/90  bone]
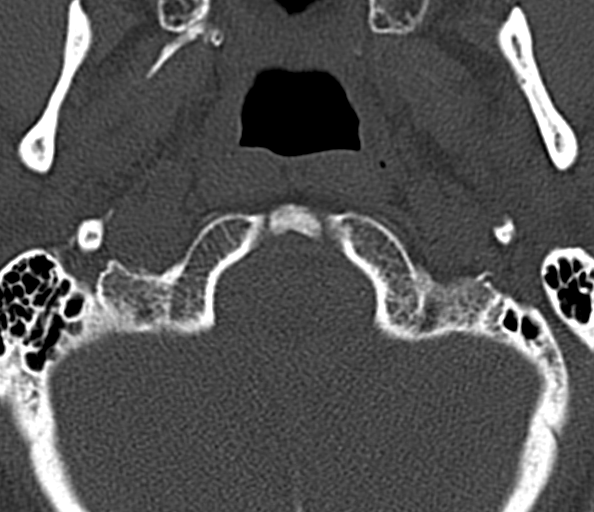

[12 of 33 positions shown; findings below may reference images not displayed]

FINDINGS: CT HEAD FINDINGS

Brain: No acute territorial infarction, hemorrhage, or intracranial
mass. Mild atrophy. Normal ventricle size.

Vascular: No hyperdense vessels.  Carotid vascular calcification

Skull: Normal. Negative for fracture or focal lesion.

Sinuses/Orbits: Mild mucosal thickening in the ethmoid sinuses

Other: None

CT CERVICAL SPINE FINDINGS

Alignment: Straightening of the cervical spine. No subluxation.
Facet alignment is normal

Skull base and vertebrae: No acute fracture. No primary bone lesion
or focal pathologic process.

Soft tissues and spinal canal: No prevertebral fluid or swelling. No
visible canal hematoma.

Disc levels: Moderate severe degenerative changes at C5-C6, C6-C7
and C7 T1. Bilateral foraminal narrowing at these levels.

Upper chest: Negative.

Other: None
IMPRESSION: 1. No CT evidence for acute intracranial abnormality.  Mild atrophy
2. Straightening of the cervical spine with degenerative changes. No
acute osseous abnormality.

## 2021-05-28 ENCOUNTER — Other Ambulatory Visit: Payer: Self-pay

## 2021-05-28 ENCOUNTER — Ambulatory Visit (INDEPENDENT_AMBULATORY_CARE_PROVIDER_SITE_OTHER): Payer: Medicare HMO

## 2021-05-28 ENCOUNTER — Encounter (INDEPENDENT_AMBULATORY_CARE_PROVIDER_SITE_OTHER): Payer: Self-pay | Admitting: Vascular Surgery

## 2021-05-28 ENCOUNTER — Ambulatory Visit (INDEPENDENT_AMBULATORY_CARE_PROVIDER_SITE_OTHER): Payer: Medicare HMO | Admitting: Vascular Surgery

## 2021-05-28 VITALS — BP 178/72 | HR 56 | Resp 16 | Wt 190.0 lb

## 2021-05-28 DIAGNOSIS — E1142 Type 2 diabetes mellitus with diabetic polyneuropathy: Secondary | ICD-10-CM | POA: Diagnosis not present

## 2021-05-28 DIAGNOSIS — E785 Hyperlipidemia, unspecified: Secondary | ICD-10-CM | POA: Diagnosis not present

## 2021-05-28 DIAGNOSIS — I10 Essential (primary) hypertension: Secondary | ICD-10-CM

## 2021-05-28 DIAGNOSIS — K551 Chronic vascular disorders of intestine: Secondary | ICD-10-CM | POA: Diagnosis not present

## 2021-05-28 NOTE — Progress Notes (Signed)
MRN : [id number]  Patrick Turner is a 75 y.o. (1946-08-03) male who presents with chief complaint of  Chief Complaint  Patient presents with   Follow-up    Ultrasound follow up  .  History of Present Illness: Patient returns today in follow up of chronic mesenteric ischemia.  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  He has been doing well.  He is eating much better.  No unintentional weight loss or food fear. Duplex today shows his SMA stent to be widely patent.  There are mildly elevated velocities in the celiac and IMA but these are stable.  Current Outpatient Medications  Medication Sig Dispense Refill   ACCU-CHEK AVIVA PLUS test strip Use to test blood sugar twice daily     Accu-Chek Softclix Lancets lancets Use to test blood sugar twice daily     acetaminophen (TYLENOL) 325 MG tablet Take 2 tablets (650 mg total) by mouth every 6 (six) hours as needed for mild pain (or Fever >/= 101). 50 tablet 0   ascorbic acid (VITAMIN C) 500 MG tablet Take by mouth.     aspirin 81 MG tablet Take 1 tablet (81 mg total) by mouth daily.     atorvastatin (LIPITOR) 80 MG tablet Take 80 mg by mouth at bedtime.     carvedilol (COREG) 6.25 MG tablet Take 6.25 mg by mouth 2 (two) times daily with a meal.     clindamycin (CLEOCIN) 300 MG capsule Take by mouth.     clonazePAM (KLONOPIN) 1 MG tablet Take 0.5 mg by mouth at bedtime as needed for anxiety.   2   gabapentin (NEURONTIN) 400 MG capsule Take 800 mg by mouth at bedtime.      hydrALAZINE (APRESOLINE) 25 MG tablet Take 25 mg by mouth 2 (two) times daily.     insulin detemir (LEVEMIR) 100 UNIT/ML injection Inject 15 Units into the skin daily.     iron polysaccharides (NIFEREX) 150 MG capsule Take 150 mg by mouth daily.     lisinopril (ZESTRIL) 10 MG tablet Take 10 mg by mouth daily.      metFORMIN (GLUCOPHAGE-XR) 500 MG 24 hr tablet Take 500 mg by mouth 2 (two) times  daily with a meal. 2 tablets twice daily with meals     metoprolol tartrate (LOPRESSOR) 25 MG tablet Take 0.5 tablets (12.5 mg total) by mouth 2 (two) times daily. (Patient taking differently: Take 25 mg by mouth at bedtime.) 60 tablet 0   Omega-3 Fatty Acids (FISH OIL) 1000 MG CAPS Take 1,000 mg by mouth 2 (two) times daily.      OZEMPIC, 0.25 OR 0.5 MG/DOSE, 2 MG/1.5ML SOPN Inject 1 Dose into the skin once a week. Wednesdays     pantoprazole (PROTONIX) 40 MG tablet Take 40 mg by mouth daily.     PARoxetine (PAXIL) 40 MG tablet Take 40 mg by mouth daily.      pioglitazone (ACTOS) 15 MG tablet Take 15 mg by mouth daily.      QUEtiapine (SEROQUEL) 100 MG tablet Take 100 mg by mouth at bedtime.     ranolazine (RANEXA) 500 MG 12 hr tablet Take 1 tablet by mouth 2 (two) times daily.     torsemide (DEMADEX) 20 MG tablet Take 20 mg by mouth daily.     vitamin B-12 (CYANOCOBALAMIN) 1000 MCG tablet Take 1,000 mcg by mouth daily.     Vitamin  D, Ergocalciferol, (DRISDOL) 1.25 MG (50000 UNIT) CAPS capsule      clopidogrel (PLAVIX) 75 MG tablet Take 1 tablet (75 mg total) by mouth daily. (Patient not taking: No sig reported) 30 tablet 11   No current facility-administered medications for this visit.    Past Medical History:  Diagnosis Date   Altered mental status    Anemia    Anxiety    Aortic atherosclerosis (HCC)    Arthritis    Coronary artery disease    CRD (chronic renal disease)    Depression    Diabetes mellitus without complication (HCC)    Encephalopathy acute    GERD (gastroesophageal reflux disease)    Headache    Hypercholesteremia    Hypertension    Neuropathy    Severe obesity ([place])    Sleep apnea    Sleep terror    per patient this year per patient     Past Surgical History:  Procedure Laterality Date   APPENDECTOMY     CIRCUMCISION     COLONOSCOPY     COLONOSCOPY WITH PROPOFOL N/A 07/10/2018   Procedure: COLONOSCOPY WITH PROPOFOL;  Surgeon: Lollie Sails, MD;   Location: [facility] ENDOSCOPY;  Service: Endoscopy;  Laterality: N/A;   COLONOSCOPY WITH PROPOFOL N/A 04/23/2019   Procedure: COLONOSCOPY WITH PROPOFOL;  Surgeon: Lollie Sails, MD;  Location: [facility] ENDOSCOPY;  Service: Endoscopy;  Laterality: N/A;   CORONARY STENT INTERVENTION N/A 10/03/2019   Procedure: CORONARY STENT INTERVENTION;  Surgeon: Yolonda Kida, MD;  Location: [place] CV LAB;  Service: Cardiovascular;  Laterality: N/A;   ESOPHAGOGASTRODUODENOSCOPY     ESOPHAGOGASTRODUODENOSCOPY (EGD) WITH PROPOFOL N/A 04/23/2019   Procedure: ESOPHAGOGASTRODUODENOSCOPY (EGD) WITH PROPOFOL;  Surgeon: Lollie Sails, MD;  Location: [facility] ENDOSCOPY;  Service: Endoscopy;  Laterality: N/A;   JOINT REPLACEMENT     KNEE ARTHROPLASTY Right 06/27/2016   Procedure: COMPUTER ASSISTED TOTAL KNEE ARTHROPLASTY;  Surgeon: Dereck Leep, MD;  Location: [facility] ORS;  Service: Orthopedics;  Laterality: Right;   KNEE ARTHROSCOPY Right    LEFT HEART CATH AND CORONARY ANGIOGRAPHY N/A 10/03/2019   Procedure: Left Heart Cath and possible Coronary intervention;  Surgeon: Dionisio David, MD;  Location: [place] CV LAB;  Service: Cardiovascular;  Laterality: N/A;   TONSILLECTOMY     VISCERAL ANGIOGRAPHY N/A 05/23/2019   Procedure: VISCERAL ANGIOGRAPHY;  Surgeon: Algernon Huxley, MD;  Location: [place] CV LAB;  Service: Cardiovascular;  Laterality: N/A;   VISCERAL ANGIOGRAPHY N/A 11/04/2019   Procedure: VISCERAL ANGIOGRAPHY;  Surgeon: Algernon Huxley, MD;  Location: [place] CV LAB;  Service: Cardiovascular;  Laterality: N/A;     Social History   Tobacco Use   Smoking status: Never   Smokeless tobacco: Never  Vaping Use   Vaping Use: Never used  Substance Use Topics   Alcohol use: Yes    Comment: rare   Drug use: No      Family History  Problem Relation Age of Onset   Diabetes Mother    Heart disease Mother    Cancer Father    COPD Father      Allergies  Allergen Reactions    Penicillins Anaphylaxis    Has patient had a PCN reaction causing immediate rash, facial/tongue/throat swelling, SOB or lightheadedness with hypotension: Yes Has patient had a PCN reaction causing severe rash involving mucus membranes or skin necrosis: No Has patient had a PCN reaction that required hospitalization Yes Has patient had a PCN reaction occurring within  the last 10 years: No If all of the above answers are "NO", then may proceed with Cephalosporin use.   Tavist [Clemastine] Swelling     REVIEW OF SYSTEMS (Negative unless checked)   Constitutional: '[x]'$ Weight loss  '[]'$ Fever  '[]'$ Chills Cardiac: '[]'$ Chest pain   '[]'$ Chest pressure   '[]'$ Palpitations   '[]'$ Shortness of breath when laying flat   '[]'$ Shortness of breath at rest   '[]'$ Shortness of breath with exertion. Vascular:  '[]'$ Pain in legs with walking   '[]'$ Pain in legs at rest   '[]'$ Pain in legs when laying flat   '[]'$ Claudication   '[]'$ Pain in feet when walking  '[]'$ Pain in feet at rest  '[]'$ Pain in feet when laying flat   '[]'$ History of DVT   '[]'$ Phlebitis   '[]'$ Swelling in legs   '[]'$ Varicose veins   '[]'$ Non-healing ulcers Pulmonary:   '[]'$ Uses home oxygen   '[]'$ Productive cough   '[]'$ Hemoptysis   '[]'$ Wheeze  '[]'$ COPD   '[]'$ Asthma Neurologic:  '[]'$ Dizziness  '[]'$ Blackouts   '[]'$ Seizures   '[]'$ History of stroke   '[]'$ History of TIA  '[]'$ Aphasia   '[]'$ Temporary blindness   '[]'$ Dysphagia   '[]'$ Weakness or numbness in arms   '[x]'$ Weakness or numbness in legs Musculoskeletal:  '[x]'$ Arthritis   '[]'$ Joint swelling   '[x]'$ Joint pain   '[]'$ Low back pain Hematologic:  '[]'$ Easy bruising  '[]'$ Easy bleeding   '[]'$ Hypercoagulable state   '[]'$ Anemic   Gastrointestinal:  '[]'$ Blood in stool   '[]'$ Vomiting blood  '[x]'$ Gastroesophageal reflux/heartburn   '[x]'$ Abdominal pain Genitourinary:  '[]'$ Chronic kidney disease   '[]'$ Difficult urination  '[]'$ Frequent urination  '[]'$ Burning with urination   '[]'$ Hematuria Skin:  '[]'$ Rashes   '[]'$ Ulcers   '[]'$ Wounds Psychological:  '[]'$ History of anxiety   '[]'$  History of major depression.   Physical Examination  BP  (!) 178/72 (BP Location: Right Arm)   Pulse (!) 56   Resp 16   Wt 190 lb (86.2 kg)   BMI 31.62 kg/m  Gen:  WD/WN, NAD Head: [place]/AT, No temporalis wasting. Ear/Nose/Throat: Hearing grossly intact, nares w/o erythema or drainage Eyes: Conjunctiva clear. Sclera non-icteric Neck: Supple.  Trachea midline Pulmonary:  Good air movement, no use of accessory muscles.  Cardiac: RRR, no JVD Vascular:  Vessel Right Left  Radial Palpable Palpable                                   Gastrointestinal: soft, non-tender/non-distended. No guarding/reflex.  Musculoskeletal: M/S 5/5 throughout.  No deformity or atrophy. Uses a cane. Mild LE edema. Neurologic: Sensation grossly intact in extremities.  Symmetrical.  Speech is fluent.  Psychiatric: Judgment intact, Mood & affect appropriate for pt's clinical situation. Dermatologic: No rashes or ulcers noted.  No cellulitis or open wounds.      Labs No results found for this or any previous visit (from the past 2160 hour(s)).  Radiology No results found.  Assessment/Plan HTN (hypertension), benign blood pressure control important in reducing the progression of atherosclerotic disease. On appropriate oral medications.     DM type 2 with diabetic peripheral neuropathy (HCC) blood glucose control important in reducing the progression of atherosclerotic disease. Also, involved in wound healing. On appropriate medications.   HLD (hyperlipidemia) lipid control important in reducing the progression of atherosclerotic disease. Continue statin therapy  Chronic mesenteric ischemia (HCC) Duplex today shows his SMA stent to be widely patent.  There are mildly elevated velocities in the celiac and IMA but these are stable.  Symptomatically, he is doing well.  No  role for intervention or procedure at this point.  Plan follow-up in 6 months and if this looks good again, we can probably go to a once a year follow-up.  Continue current medical  regimen.    [name], MD  05/28/2021 9:40 AM    This note was created with Dragon medical transcription system.  Any errors from dictation are purely unintentional

## 2021-05-28 NOTE — Assessment & Plan Note (Signed)
Duplex today shows his SMA stent to be widely patent.  There are mildly elevated velocities in the celiac and IMA but these are stable.  Symptomatically, he is doing well.  No role for intervention or procedure at this point.  Plan follow-up in 6 months and if this looks good again, we can probably go to a once a year follow-up.  Continue current medical regimen.

## 2021-06-07 DIAGNOSIS — F323 Major depressive disorder, single episode, severe with psychotic features: Secondary | ICD-10-CM | POA: Diagnosis not present

## 2021-06-14 DIAGNOSIS — E1142 Type 2 diabetes mellitus with diabetic polyneuropathy: Secondary | ICD-10-CM | POA: Diagnosis not present

## 2021-06-15 DIAGNOSIS — E1122 Type 2 diabetes mellitus with diabetic chronic kidney disease: Secondary | ICD-10-CM | POA: Diagnosis not present

## 2021-06-15 DIAGNOSIS — I129 Hypertensive chronic kidney disease with stage 1 through stage 4 chronic kidney disease, or unspecified chronic kidney disease: Secondary | ICD-10-CM | POA: Diagnosis not present

## 2021-06-15 DIAGNOSIS — D631 Anemia in chronic kidney disease: Secondary | ICD-10-CM | POA: Diagnosis not present

## 2021-06-15 DIAGNOSIS — E875 Hyperkalemia: Secondary | ICD-10-CM | POA: Diagnosis not present

## 2021-06-15 DIAGNOSIS — R809 Proteinuria, unspecified: Secondary | ICD-10-CM | POA: Diagnosis not present

## 2021-06-15 DIAGNOSIS — N1832 Chronic kidney disease, stage 3b: Secondary | ICD-10-CM | POA: Diagnosis not present

## 2021-06-16 DIAGNOSIS — E113312 Type 2 diabetes mellitus with moderate nonproliferative diabetic retinopathy with macular edema, left eye: Secondary | ICD-10-CM | POA: Diagnosis not present

## 2021-06-16 DIAGNOSIS — E113311 Type 2 diabetes mellitus with moderate nonproliferative diabetic retinopathy with macular edema, right eye: Secondary | ICD-10-CM | POA: Diagnosis not present

## 2021-06-16 DIAGNOSIS — E113313 Type 2 diabetes mellitus with moderate nonproliferative diabetic retinopathy with macular edema, bilateral: Secondary | ICD-10-CM | POA: Diagnosis not present

## 2021-06-21 DIAGNOSIS — D631 Anemia in chronic kidney disease: Secondary | ICD-10-CM | POA: Diagnosis not present

## 2021-06-21 DIAGNOSIS — R809 Proteinuria, unspecified: Secondary | ICD-10-CM | POA: Diagnosis not present

## 2021-06-21 DIAGNOSIS — N1831 Chronic kidney disease, stage 3a: Secondary | ICD-10-CM | POA: Diagnosis not present

## 2021-06-21 DIAGNOSIS — I129 Hypertensive chronic kidney disease with stage 1 through stage 4 chronic kidney disease, or unspecified chronic kidney disease: Secondary | ICD-10-CM | POA: Diagnosis not present

## 2021-06-21 DIAGNOSIS — E1122 Type 2 diabetes mellitus with diabetic chronic kidney disease: Secondary | ICD-10-CM | POA: Diagnosis not present

## 2021-06-21 DIAGNOSIS — E875 Hyperkalemia: Secondary | ICD-10-CM | POA: Diagnosis not present

## 2021-06-22 DIAGNOSIS — E1142 Type 2 diabetes mellitus with diabetic polyneuropathy: Secondary | ICD-10-CM | POA: Diagnosis not present

## 2021-06-22 DIAGNOSIS — I10 Essential (primary) hypertension: Secondary | ICD-10-CM | POA: Diagnosis not present

## 2021-06-22 DIAGNOSIS — Z794 Long term (current) use of insulin: Secondary | ICD-10-CM | POA: Diagnosis not present

## 2021-06-22 DIAGNOSIS — E1122 Type 2 diabetes mellitus with diabetic chronic kidney disease: Secondary | ICD-10-CM | POA: Diagnosis not present

## 2021-06-22 DIAGNOSIS — E113393 Type 2 diabetes mellitus with moderate nonproliferative diabetic retinopathy without macular edema, bilateral: Secondary | ICD-10-CM | POA: Diagnosis not present

## 2021-06-22 DIAGNOSIS — E1159 Type 2 diabetes mellitus with other circulatory complications: Secondary | ICD-10-CM | POA: Diagnosis not present

## 2021-06-22 DIAGNOSIS — N183 Chronic kidney disease, stage 3 unspecified: Secondary | ICD-10-CM | POA: Diagnosis not present

## 2021-06-29 DIAGNOSIS — Z96651 Presence of right artificial knee joint: Secondary | ICD-10-CM | POA: Diagnosis not present

## 2021-07-16 ENCOUNTER — Other Ambulatory Visit: Payer: Self-pay

## 2021-07-16 NOTE — Patient Outreach (Signed)
[facility]) Care Management  07/16/2021  Patrick Turner Jul 13, 1946 [id number]   Telephone call to patient for disease management follow up.   No answer.  Unable to leave a message.   Plan: If no return call, RN CM will attempt patient again in February.  Jone Baseman, RN, MSN [place] [facility] Care Management Coordinator Direct Line [phone] Cell [phone] Toll Free: [phone]  Fax: [phone]

## 2021-07-28 DIAGNOSIS — E113313 Type 2 diabetes mellitus with moderate nonproliferative diabetic retinopathy with macular edema, bilateral: Secondary | ICD-10-CM | POA: Diagnosis not present

## 2021-07-30 DIAGNOSIS — I2510 Atherosclerotic heart disease of native coronary artery without angina pectoris: Secondary | ICD-10-CM | POA: Diagnosis not present

## 2021-07-30 DIAGNOSIS — E782 Mixed hyperlipidemia: Secondary | ICD-10-CM | POA: Diagnosis not present

## 2021-07-30 DIAGNOSIS — R0602 Shortness of breath: Secondary | ICD-10-CM | POA: Diagnosis not present

## 2021-07-30 DIAGNOSIS — I10 Essential (primary) hypertension: Secondary | ICD-10-CM | POA: Diagnosis not present

## 2021-08-31 DIAGNOSIS — F0390 Unspecified dementia without behavioral disturbance: Secondary | ICD-10-CM | POA: Diagnosis not present

## 2021-08-31 DIAGNOSIS — F332 Major depressive disorder, recurrent severe without psychotic features: Secondary | ICD-10-CM | POA: Diagnosis not present

## 2021-09-16 ENCOUNTER — Other Ambulatory Visit: Payer: Self-pay

## 2021-09-16 ENCOUNTER — Inpatient Hospital Stay (HOSPITAL_COMMUNITY)
Admission: EM | Admit: 2021-09-16 | Discharge: 2021-09-22 | DRG: 481 | Disposition: A | Discharge status: Home Health | Payer: Medicare HMO | Attending: Family Medicine | Admitting: Family Medicine

## 2021-09-16 ENCOUNTER — Emergency Department (HOSPITAL_COMMUNITY): Payer: Medicare HMO

## 2021-09-16 DIAGNOSIS — E669 Obesity, unspecified: Secondary | ICD-10-CM | POA: Diagnosis not present

## 2021-09-16 DIAGNOSIS — Z96651 Presence of right artificial knee joint: Secondary | ICD-10-CM | POA: Diagnosis present

## 2021-09-16 DIAGNOSIS — M25552 Pain in left hip: Secondary | ICD-10-CM | POA: Diagnosis not present

## 2021-09-16 DIAGNOSIS — R0902 Hypoxemia: Secondary | ICD-10-CM | POA: Diagnosis present

## 2021-09-16 DIAGNOSIS — I25118 Atherosclerotic heart disease of native coronary artery with other forms of angina pectoris: Secondary | ICD-10-CM | POA: Diagnosis present

## 2021-09-16 DIAGNOSIS — F03A3 Unspecified dementia, mild, with mood disturbance: Secondary | ICD-10-CM | POA: Diagnosis present

## 2021-09-16 DIAGNOSIS — S72145A Nondisplaced intertrochanteric fracture of left femur, initial encounter for closed fracture: Secondary | ICD-10-CM

## 2021-09-16 DIAGNOSIS — Z7982 Long term (current) use of aspirin: Secondary | ICD-10-CM

## 2021-09-16 DIAGNOSIS — Y9289 Other specified places as the place of occurrence of the external cause: Secondary | ICD-10-CM | POA: Diagnosis not present

## 2021-09-16 DIAGNOSIS — M50321 Other cervical disc degeneration at C4-C5 level: Secondary | ICD-10-CM | POA: Diagnosis not present

## 2021-09-16 DIAGNOSIS — E875 Hyperkalemia: Secondary | ICD-10-CM | POA: Diagnosis present

## 2021-09-16 DIAGNOSIS — W108XXA Fall (on) (from) other stairs and steps, initial encounter: Secondary | ICD-10-CM | POA: Diagnosis present

## 2021-09-16 DIAGNOSIS — Z6830 Body mass index (BMI) 30.0-30.9, adult: Secondary | ICD-10-CM

## 2021-09-16 DIAGNOSIS — R739 Hyperglycemia, unspecified: Secondary | ICD-10-CM | POA: Diagnosis not present

## 2021-09-16 DIAGNOSIS — R55 Syncope and collapse: Secondary | ICD-10-CM | POA: Diagnosis not present

## 2021-09-16 DIAGNOSIS — G2119 Other drug induced secondary parkinsonism: Secondary | ICD-10-CM | POA: Diagnosis present

## 2021-09-16 DIAGNOSIS — Z794 Long term (current) use of insulin: Secondary | ICD-10-CM

## 2021-09-16 DIAGNOSIS — E1142 Type 2 diabetes mellitus with diabetic polyneuropathy: Secondary | ICD-10-CM | POA: Diagnosis present

## 2021-09-16 DIAGNOSIS — K219 Gastro-esophageal reflux disease without esophagitis: Secondary | ICD-10-CM | POA: Diagnosis present

## 2021-09-16 DIAGNOSIS — S72142A Displaced intertrochanteric fracture of left femur, initial encounter for closed fracture: Secondary | ICD-10-CM | POA: Diagnosis not present

## 2021-09-16 DIAGNOSIS — F03A0 Unspecified dementia, mild, without behavioral disturbance, psychotic disturbance, mood disturbance, and anxiety: Secondary | ICD-10-CM

## 2021-09-16 DIAGNOSIS — R410 Disorientation, unspecified: Secondary | ICD-10-CM | POA: Diagnosis not present

## 2021-09-16 DIAGNOSIS — E7800 Pure hypercholesterolemia, unspecified: Secondary | ICD-10-CM | POA: Diagnosis present

## 2021-09-16 DIAGNOSIS — Z833 Family history of diabetes mellitus: Secondary | ICD-10-CM

## 2021-09-16 DIAGNOSIS — Z7984 Long term (current) use of oral hypoglycemic drugs: Secondary | ICD-10-CM

## 2021-09-16 DIAGNOSIS — Z79899 Other long term (current) drug therapy: Secondary | ICD-10-CM

## 2021-09-16 DIAGNOSIS — R300 Dysuria: Secondary | ICD-10-CM | POA: Diagnosis present

## 2021-09-16 DIAGNOSIS — I129 Hypertensive chronic kidney disease with stage 1 through stage 4 chronic kidney disease, or unspecified chronic kidney disease: Secondary | ICD-10-CM | POA: Diagnosis present

## 2021-09-16 DIAGNOSIS — Z825 Family history of asthma and other chronic lower respiratory diseases: Secondary | ICD-10-CM

## 2021-09-16 DIAGNOSIS — Z20822 Contact with and (suspected) exposure to covid-19: Secondary | ICD-10-CM | POA: Diagnosis present

## 2021-09-16 DIAGNOSIS — R809 Proteinuria, unspecified: Secondary | ICD-10-CM | POA: Diagnosis not present

## 2021-09-16 DIAGNOSIS — W19XXXA Unspecified fall, initial encounter: Secondary | ICD-10-CM | POA: Diagnosis not present

## 2021-09-16 DIAGNOSIS — Z419 Encounter for procedure for purposes other than remedying health state, unspecified: Secondary | ICD-10-CM

## 2021-09-16 DIAGNOSIS — N1832 Chronic kidney disease, stage 3b: Secondary | ICD-10-CM | POA: Diagnosis not present

## 2021-09-16 DIAGNOSIS — I10 Essential (primary) hypertension: Secondary | ICD-10-CM | POA: Diagnosis not present

## 2021-09-16 DIAGNOSIS — E1122 Type 2 diabetes mellitus with diabetic chronic kidney disease: Secondary | ICD-10-CM | POA: Diagnosis present

## 2021-09-16 DIAGNOSIS — Z955 Presence of coronary angioplasty implant and graft: Secondary | ICD-10-CM

## 2021-09-16 DIAGNOSIS — D631 Anemia in chronic kidney disease: Secondary | ICD-10-CM | POA: Diagnosis not present

## 2021-09-16 DIAGNOSIS — R0689 Other abnormalities of breathing: Secondary | ICD-10-CM | POA: Diagnosis not present

## 2021-09-16 DIAGNOSIS — Z043 Encounter for examination and observation following other accident: Secondary | ICD-10-CM | POA: Diagnosis not present

## 2021-09-16 DIAGNOSIS — Y92000 Kitchen of unspecified non-institutional (private) residence as  the place of occurrence of the external cause: Secondary | ICD-10-CM

## 2021-09-16 DIAGNOSIS — I2510 Atherosclerotic heart disease of native coronary artery without angina pectoris: Secondary | ICD-10-CM | POA: Diagnosis not present

## 2021-09-16 DIAGNOSIS — F05 Delirium due to known physiological condition: Secondary | ICD-10-CM | POA: Diagnosis not present

## 2021-09-16 DIAGNOSIS — Z880 Allergy status to penicillin: Secondary | ICD-10-CM

## 2021-09-16 DIAGNOSIS — Z8673 Personal history of transient ischemic attack (TIA), and cerebral infarction without residual deficits: Secondary | ICD-10-CM

## 2021-09-16 DIAGNOSIS — T40411A Poisoning by fentanyl or fentanyl analogs, accidental (unintentional), initial encounter: Secondary | ICD-10-CM

## 2021-09-16 DIAGNOSIS — R42 Dizziness and giddiness: Secondary | ICD-10-CM | POA: Diagnosis not present

## 2021-09-16 DIAGNOSIS — G4733 Obstructive sleep apnea (adult) (pediatric): Secondary | ICD-10-CM | POA: Diagnosis present

## 2021-09-16 DIAGNOSIS — S0990XA Unspecified injury of head, initial encounter: Secondary | ICD-10-CM | POA: Diagnosis not present

## 2021-09-16 DIAGNOSIS — R296 Repeated falls: Secondary | ICD-10-CM | POA: Diagnosis present

## 2021-09-16 DIAGNOSIS — L309 Dermatitis, unspecified: Secondary | ICD-10-CM | POA: Diagnosis not present

## 2021-09-16 DIAGNOSIS — Z7985 Long-term (current) use of injectable non-insulin antidiabetic drugs: Secondary | ICD-10-CM

## 2021-09-16 DIAGNOSIS — Z888 Allergy status to other drugs, medicaments and biological substances status: Secondary | ICD-10-CM

## 2021-09-16 DIAGNOSIS — Z8249 Family history of ischemic heart disease and other diseases of the circulatory system: Secondary | ICD-10-CM

## 2021-09-16 DIAGNOSIS — S72002A Fracture of unspecified part of neck of left femur, initial encounter for closed fracture: Secondary | ICD-10-CM | POA: Diagnosis not present

## 2021-09-16 DIAGNOSIS — M50322 Other cervical disc degeneration at C5-C6 level: Secondary | ICD-10-CM | POA: Diagnosis not present

## 2021-09-16 DIAGNOSIS — G8918 Other acute postprocedural pain: Secondary | ICD-10-CM | POA: Diagnosis not present

## 2021-09-16 DIAGNOSIS — N1831 Chronic kidney disease, stage 3a: Secondary | ICD-10-CM | POA: Diagnosis not present

## 2021-09-16 DIAGNOSIS — E1169 Type 2 diabetes mellitus with other specified complication: Secondary | ICD-10-CM | POA: Diagnosis present

## 2021-09-16 DIAGNOSIS — F419 Anxiety disorder, unspecified: Secondary | ICD-10-CM | POA: Diagnosis present

## 2021-09-16 DIAGNOSIS — Z9889 Other specified postprocedural states: Secondary | ICD-10-CM | POA: Diagnosis not present

## 2021-09-16 LAB — BASIC METABOLIC PANEL
Anion gap: 6 (ref 5–15)
BUN: 34 mg/dL — ABNORMAL HIGH (ref 8–23)
CO2: 26 mmol/L (ref 22–32)
Calcium: 8.5 mg/dL — ABNORMAL LOW (ref 8.9–10.3)
Chloride: 105 mmol/L (ref 98–111)
Creatinine, Ser: 1.82 mg/dL — ABNORMAL HIGH (ref 0.61–1.24)
GFR, Estimated: 38 mL/min — ABNORMAL LOW (ref >60)
Glucose, Bld: 209 mg/dL — ABNORMAL HIGH (ref 70–99)
Potassium: 5.5 mmol/L — ABNORMAL HIGH (ref 3.5–5.1)
Sodium: 137 mmol/L (ref 135–145)

## 2021-09-16 LAB — CBC WITH DIFFERENTIAL/PLATELET
Abs Immature Granulocytes: 0.07 10*3/uL (ref 0.00–0.07)
Basophils Absolute: 0 10*3/uL (ref 0.0–0.1)
Basophils Relative: 0 %
Eosinophils Absolute: 0.1 10*3/uL (ref 0.0–0.5)
Eosinophils Relative: 1 %
HCT: 34.6 % — ABNORMAL LOW (ref 39.0–52.0)
Hemoglobin: 11.1 g/dL — ABNORMAL LOW (ref 13.0–17.0)
Immature Granulocytes: 1 %
Lymphocytes Relative: 10 %
Lymphs Abs: 0.7 10*3/uL (ref 0.7–4.0)
MCH: 30.9 pg (ref 26.0–34.0)
MCHC: 32.1 g/dL (ref 30.0–36.0)
MCV: 96.4 fL (ref 80.0–100.0)
Monocytes Absolute: 0.6 10*3/uL (ref 0.1–1.0)
Monocytes Relative: 8 %
Neutro Abs: 5.5 10*3/uL (ref 1.7–7.7)
Neutrophils Relative %: 80 %
Platelets: 233 10*3/uL (ref 150–400)
RBC: 3.59 MIL/uL — ABNORMAL LOW (ref 4.22–5.81)
RDW: 15.6 % — ABNORMAL HIGH (ref 11.5–15.5)
WBC: 7 10*3/uL (ref 4.0–10.5)
nRBC: 0 % (ref 0.0–0.2)

## 2021-09-16 LAB — TYPE AND SCREEN
ABO/RH(D): O POS
Antibody Screen: NEGATIVE

## 2021-09-16 LAB — RESP PANEL BY RT-PCR (FLU A&B, COVID) ARPGX2
Influenza A by PCR: NEGATIVE
Influenza B by PCR: NEGATIVE
SARS Coronavirus 2 by RT PCR: NEGATIVE

## 2021-09-16 LAB — PROTIME-INR
INR: 1.1 (ref 0.8–1.2)
Prothrombin Time: 13.8 seconds (ref 11.4–15.2)

## 2021-09-16 LAB — SURGICAL PCR SCREEN
MRSA, PCR: NEGATIVE
Staphylococcus aureus: NEGATIVE

## 2021-09-16 LAB — GLUCOSE, CAPILLARY: Glucose-Capillary: 264 mg/dL — ABNORMAL HIGH (ref 70–99)

## 2021-09-16 LAB — CBG MONITORING, ED: Glucose-Capillary: 248 mg/dL — ABNORMAL HIGH (ref 70–99)

## 2021-09-16 LAB — TROPONIN I (HIGH SENSITIVITY): Troponin I (High Sensitivity): 4 ng/L (ref <18)

## 2021-09-16 MED ORDER — CHLORHEXIDINE GLUCONATE 0.12 % MT SOLN
OROMUCOSAL | Status: AC
  Administered 2021-09-17: 15 mL via OROMUCOSAL
  Filled 2021-09-16: qty 15

## 2021-09-16 MED ORDER — SODIUM ZIRCONIUM CYCLOSILICATE 10 G PO PACK
10.0000 g | PACK | Freq: Once | ORAL | Status: AC
  Administered 2021-09-16: 10 g via ORAL
  Filled 2021-09-16: qty 1

## 2021-09-16 MED ORDER — ONDANSETRON HCL 4 MG/2ML IJ SOLN: 4.0000 mg | Freq: Once | INTRAMUSCULAR | Status: AC

## 2021-09-16 MED ORDER — HYDROMORPHONE HCL 1 MG/ML IJ SOLN
0.1000 mg | INTRAMUSCULAR | Status: DC | PRN
  Administered 2021-09-16 – 2021-09-17 (×2): 0.1 mg via INTRAVENOUS
  Filled 2021-09-16 (×2): qty 0.5

## 2021-09-16 MED ORDER — LOSARTAN POTASSIUM 25 MG PO TABS
25.0000 mg | ORAL_TABLET | Freq: Every day | ORAL | Status: DC
  Administered 2021-09-17: 25 mg via ORAL
  Filled 2021-09-16: qty 1

## 2021-09-16 MED ORDER — HYDROMORPHONE HCL 1 MG/ML IJ SOLN: 0.1000 mg | Freq: Three times a day (TID) | INTRAMUSCULAR | Status: DC

## 2021-09-16 MED ORDER — POLYSACCHARIDE IRON COMPLEX 150 MG PO CAPS
150.0000 mg | ORAL_CAPSULE | Freq: Every day | ORAL | Status: DC
  Administered 2021-09-17 – 2021-09-22 (×6): 150 mg via ORAL
  Filled 2021-09-16 (×6): qty 1

## 2021-09-16 MED ORDER — SODIUM CHLORIDE 0.9 % IV BOLUS
1000.0000 mL | Freq: Once | INTRAVENOUS | Status: AC
  Administered 2021-09-16: 1000 mL via INTRAVENOUS

## 2021-09-16 MED ORDER — NALOXONE HCL 2 MG/2ML IJ SOSY
PREFILLED_SYRINGE | INTRAMUSCULAR | Status: AC
  Administered 2021-09-16: 1 mg via INTRAVENOUS
  Filled 2021-09-16: qty 2

## 2021-09-16 MED ORDER — PAROXETINE HCL 20 MG PO TABS
40.0000 mg | ORAL_TABLET | Freq: Every day | ORAL | Status: DC
  Administered 2021-09-17 – 2021-09-22 (×6): 40 mg via ORAL
  Filled 2021-09-16 (×6): qty 2

## 2021-09-16 MED ORDER — CHLORHEXIDINE GLUCONATE 4 % EX LIQD
60.0000 mL | Freq: Once | CUTANEOUS | Status: DC
  Filled 2021-09-16: qty 60

## 2021-09-16 MED ORDER — HEPARIN SODIUM (PORCINE) 5000 UNIT/ML IJ SOLN
5000.0000 [IU] | Freq: Three times a day (TID) | INTRAMUSCULAR | Status: DC
  Administered 2021-09-16 – 2021-09-17 (×2): 5000 [IU] via SUBCUTANEOUS
  Filled 2021-09-16 (×2): qty 1

## 2021-09-16 MED ORDER — METOPROLOL TARTRATE 25 MG PO TABS
25.0000 mg | ORAL_TABLET | Freq: Every day | ORAL | Status: DC
  Administered 2021-09-16: 25 mg via ORAL
  Filled 2021-09-16: qty 1

## 2021-09-16 MED ORDER — INSULIN ASPART 100 UNIT/ML IJ SOLN
0.0000 [IU] | Freq: Three times a day (TID) | INTRAMUSCULAR | Status: DC
  Administered 2021-09-17: 8 [IU] via SUBCUTANEOUS
  Administered 2021-09-18 (×2): 5 [IU] via SUBCUTANEOUS
  Administered 2021-09-18: 8 [IU] via SUBCUTANEOUS
  Administered 2021-09-19 (×2): 3 [IU] via SUBCUTANEOUS
  Administered 2021-09-19: 2 [IU] via SUBCUTANEOUS
  Administered 2021-09-20: 3 [IU] via SUBCUTANEOUS
  Administered 2021-09-20 – 2021-09-21 (×3): 5 [IU] via SUBCUTANEOUS
  Administered 2021-09-21: 3 [IU] via SUBCUTANEOUS
  Administered 2021-09-21: 5 [IU] via SUBCUTANEOUS
  Administered 2021-09-22 (×2): 3 [IU] via SUBCUTANEOUS

## 2021-09-16 MED ORDER — ONDANSETRON HCL 4 MG/2ML IJ SOLN
INTRAMUSCULAR | Status: AC
  Administered 2021-09-16: 4 mg via INTRAVENOUS
  Filled 2021-09-16: qty 2

## 2021-09-16 MED ORDER — VANCOMYCIN HCL 1500 MG/300ML IV SOLN
1500.0000 mg | INTRAVENOUS | Status: AC
  Filled 2021-09-16 (×2): qty 300

## 2021-09-16 MED ORDER — HYDROMORPHONE HCL 1 MG/ML IJ SOLN: 0.1000 mg | INTRAMUSCULAR | Status: DC

## 2021-09-16 MED ORDER — POVIDONE-IODINE 10 % EX SWAB: 2.0000 "application " | Freq: Once | CUTANEOUS | Status: DC

## 2021-09-16 MED ORDER — RANOLAZINE ER 500 MG PO TB12
500.0000 mg | ORAL_TABLET | Freq: Two times a day (BID) | ORAL | Status: DC
  Administered 2021-09-16 – 2021-09-22 (×12): 500 mg via ORAL
  Filled 2021-09-16 (×13): qty 1

## 2021-09-16 MED ORDER — LOSARTAN POTASSIUM 25 MG PO TABS
25.0000 mg | ORAL_TABLET | Freq: Every day | ORAL | Status: DC
  Administered 2021-09-16: 25 mg via ORAL
  Filled 2021-09-16: qty 1

## 2021-09-16 MED ORDER — ATORVASTATIN CALCIUM 80 MG PO TABS
80.0000 mg | ORAL_TABLET | Freq: Every day | ORAL | Status: DC
  Administered 2021-09-16 – 2021-09-21 (×6): 80 mg via ORAL
  Filled 2021-09-16 (×6): qty 1

## 2021-09-16 MED ORDER — NALOXONE HCL 2 MG/2ML IJ SOSY: 1.0000 mg | PREFILLED_SYRINGE | Freq: Once | INTRAMUSCULAR | Status: AC

## 2021-09-16 MED ORDER — CARVEDILOL 6.25 MG PO TABS
6.2500 mg | ORAL_TABLET | Freq: Two times a day (BID) | ORAL | Status: DC
  Administered 2021-09-16 – 2021-09-17 (×2): 6.25 mg via ORAL
  Filled 2021-09-16 (×2): qty 1

## 2021-09-16 MED ORDER — VITAMIN B-12 1000 MCG PO TABS
1000.0000 ug | ORAL_TABLET | Freq: Every day | ORAL | Status: DC
  Administered 2021-09-17 – 2021-09-22 (×6): 1000 ug via ORAL
  Filled 2021-09-16 (×6): qty 1

## 2021-09-16 MED ORDER — VANCOMYCIN HCL IN DEXTROSE 1-5 GM/200ML-% IV SOLN
INTRAVENOUS | Status: AC
  Filled 2021-09-16: qty 200

## 2021-09-16 MED ORDER — PANTOPRAZOLE SODIUM 40 MG PO TBEC
40.0000 mg | DELAYED_RELEASE_TABLET | Freq: Every day | ORAL | Status: DC
  Administered 2021-09-17 – 2021-09-22 (×6): 40 mg via ORAL
  Filled 2021-09-16 (×6): qty 1

## 2021-09-16 NOTE — ED Provider Notes (Signed)
[facility] EMERGENCY DEPARTMENT Provider Note   CSN: [id number] Arrival date & time: 09/16/21  1251     History  No chief complaint on file.   Patrick Turner is a 76 y.o. male.  Pt is a 76 year old white male with multiple medical problems.  Per EMS, he had a syncopal event at home and landed on his left hip.  He did hit his head on the corner of a nightstand.  He is not on blood thinners, but does take Plavix and ASA.  Pt received 125 mcg Fentanyl by EMS en route and is drowsy and unable to answer any questions.        Home Medications Prior to Admission medications   Medication Sig Start Date End Date Taking? Authorizing Provider  ascorbic acid (VITAMIN C) 500 MG tablet Take 500 mg by mouth daily.   Yes [provider]  aspirin 81 MG tablet Take 1 tablet (81 mg total) by mouth daily. 02/23/18  Yes Henreitta Leber, MD  atorvastatin (LIPITOR) 80 MG tablet Take 80 mg by mouth at bedtime. 12/14/15  Yes [provider]  carvedilol (COREG) 6.25 MG tablet Take 6.25 mg by mouth 2 (two) times daily with a meal.   Yes [provider]  Cholecalciferol 50 MCG (2000 UT) TABS Take 2,000 Units by mouth daily. 08/26/21 08/26/22 Yes [provider]  clindamycin (CLEOCIN) 300 MG capsule Take 300 mg by mouth daily as needed (prior to dental appointments). 01/22/20  Yes [provider]  clonazePAM (KLONOPIN) 1 MG tablet Take 0.5 mg by mouth at bedtime as needed for anxiety.  12/18/15  Yes [provider]  gabapentin (NEURONTIN) 400 MG capsule Take 400 mg by mouth 2 (two) times daily.   Yes [provider]  insulin detemir (LEVEMIR) 100 UNIT/ML injection Inject 15 Units into the skin at bedtime. 03/04/19  Yes [provider]  iron polysaccharides (NIFEREX) 150 MG capsule Take 150 mg by mouth daily.   Yes [provider]  losartan (COZAAR) 25 MG tablet Take 25 mg by mouth daily. 03/05/21  Yes [provider]  metFORMIN (GLUCOPHAGE-XR) 500 MG 24 hr tablet Take 500 mg by mouth 2 (two) times daily with a meal. 2 tablets twice daily with meals   Yes [provider]  metoprolol tartrate (LOPRESSOR) 25 MG tablet Take 0.5 tablets (12.5 mg total) by mouth 2 (two) times daily. Patient taking differently: Take 25 mg by mouth at bedtime. 10/04/19  Yes Para Skeans, MD  Omega-3 Fatty Acids (FISH OIL) 1000 MG CAPS Take 1,000 mg by mouth 2 (two) times daily.    Yes [provider]  OZEMPIC, 0.25 OR 0.5 MG/DOSE, 2 MG/1.5ML SOPN Inject 1 Dose into the skin once a week. Wednesdays 03/04/19  Yes [provider]  pantoprazole (PROTONIX) 40 MG tablet Take 40 mg by mouth daily.   Yes [provider]  PARoxetine (PAXIL) 40 MG tablet Take 40 mg by mouth daily.    Yes [provider]  pioglitazone (ACTOS) 15 MG tablet Take 15 mg by mouth daily.    Yes [provider]  ranolazine (RANEXA) 500 MG 12 hr tablet Take 500 mg by mouth 2 (two) times daily. 10/07/19  Yes [provider]  vitamin B-12 (CYANOCOBALAMIN) 1000 MCG tablet Take 1,000 mcg by mouth daily.   Yes [provider]  Vitamin D, Ergocalciferol, (DRISDOL) 1.25 MG (50000 UNIT) CAPS capsule Take 50,000 Units by mouth  daily. 03/27/20  Yes [provider]  [place] test strip Use to test blood sugar twice daily 10/13/19   [provider]  Accu-Chek Softclix Lancets lancets Use to test blood sugar twice daily 10/13/19   [provider]  acetaminophen (TYLENOL) 325 MG tablet Take 2 tablets (650 mg total) by mouth every 6 (six) hours as needed for mild pain (or Fever >/= 101). Patient not taking: Reported on 09/16/2021 10/14/19   Thornell Mule, MD  clopidogrel (PLAVIX) 75 MG tablet Take 1 tablet (75 mg total) by mouth daily. Patient not taking: Reported on 02/18/2020 09/09/19   Kris Hartmann, NP      Allergies    Penicillins and Tavist [clemastine]     Review of Systems   Review of Systems  Musculoskeletal:        Left hip pain  All other systems reviewed and are negative.  Physical Exam Updated Vital Signs BP (!) 185/61    Pulse 64    Temp (!) 96.7 F (35.9 C) (Axillary)    Resp 14    SpO2 100%  Physical Exam Vitals and nursing note reviewed.  Constitutional:      Appearance: Normal appearance.  HENT:     Head: Normocephalic and atraumatic.     Nose: Nose normal.     Mouth/Throat:     Mouth: Mucous membranes are moist.     Pharynx: Oropharynx is clear.  Eyes:     Extraocular Movements: Extraocular movements intact.     Conjunctiva/sclera: Conjunctivae normal.     Pupils: Pupils are equal, round, and reactive to light.  Cardiovascular:     Rate and Rhythm: Normal rate and regular rhythm.     Pulses: Normal pulses.     Heart sounds: Normal heart sounds.  Pulmonary:     Effort: Pulmonary effort is normal.     Breath sounds: Normal breath sounds.  Abdominal:     General: Abdomen is flat. Bowel sounds are normal.     Palpations: Abdomen is soft.  Musculoskeletal:     Cervical back: Normal range of motion and neck supple.     Left hip: Deformity, tenderness and bony tenderness present. Decreased range of motion.  Skin:    General: Skin is warm.     Capillary Refill: Capillary refill takes less than 2 seconds.  Neurological:     Mental Status: He is lethargic and confused.  Psychiatric:        Mood and Affect: Mood normal.    ED Results / Procedures / Treatments   Labs (all labs ordered are listed, but only abnormal results are displayed) Labs Reviewed  BASIC METABOLIC PANEL - Abnormal; Notable for the following components:      Result Value   Potassium 5.5 (*)    Glucose, Bld 209 (*)    BUN 34 (*)    Creatinine, Ser 1.82 (*)    Calcium 8.5 (*)    GFR, Estimated 38 (*)    All other components within normal limits  CBC WITH DIFFERENTIAL/PLATELET - Abnormal; Notable for the following components:   RBC 3.59  (*)    Hemoglobin 11.1 (*)    HCT 34.6 (*)    RDW 15.6 (*)    All other components within normal limits  CBG MONITORING, ED - Abnormal; Notable for the following components:   Glucose-Capillary 248 (*)    All other components within normal limits  RESP PANEL BY RT-PCR (FLU A&B, COVID) ARPGX2  PROTIME-INR  TYPE AND SCREEN  TROPONIN I (HIGH SENSITIVITY)    EKG EKG Interpretation  Date/Time:  Thursday September 16 2021 14:54:50 EST Ventricular Rate:  64 PR Interval:  184 QRS Duration: 94 QT Interval:  412 QTC Calculation: 425 R Axis:   -6 Text Interpretation: Normal sinus rhythm Normal ECG When compared with ECG of 21-Jan-2021 19:24, PREVIOUS ECG IS PRESENT No significant change since last tracing Confirmed by Isla Pence [phone]) on 09/16/2021 2:58:19 PM  Radiology DG Chest 1 View  Result Date: 09/16/2021 CLINICAL DATA:  Provided history: Fall. Additional history provided: Episode of near syncope (with head trauma). Patient reports left hip pain. EXAM: CHEST  1 VIEW COMPARISON:  CT angiogram chest/abdomen/pelvis 10/23/2020. Prior chest radiographs 10/23/2020 and earlier. FINDINGS: Heart size within normal limits. No appreciable airspace consolidation. No evidence of pleural effusion or pneumothorax. No acute bony abnormality identified. A 2.3 cm round to ovoid radiodense foreign body projects over the midline lower neck. Impression #1 was called by telephone at the time of interpretation on 09/16/2021 at 2:06 pm to provider [facility] , who verbally acknowledged these results. IMPRESSION: A 2.3 cm round to ovoid radiodense foreign body projects over the midline lower neck. This may overlie the patient. However, an aspirated/ingested foreign body cannot be excluded, and clinical correlation is recommended. Otherwise, no evidence of acute cardiopulmonary abnormality. Electronically Signed   By: Kellie Simmering D.O.   On: 09/16/2021 14:06   DG Knee 2 Views Left  Result Date:  09/16/2021 CLINICAL DATA:  Fall EXAM: LEFT KNEE - 1-2 VIEW COMPARISON:  None. FINDINGS: Suboptimal positioning. No fracture identified. True lateral view not obtained. Advanced joint space narrowing medially with associated spurring. Mild widening of the lateral joint space. Arterial calcification IMPRESSION: Negative for fracture.  Suboptimal positioning. Electronically Signed   By: Franchot Gallo M.D.   On: 09/16/2021 14:02   CT HEAD WO CONTRAST  Result Date: 09/16/2021 CLINICAL DATA:  Fall at home.  Head trauma. EXAM: CT HEAD WITHOUT CONTRAST CT CERVICAL SPINE WITHOUT CONTRAST TECHNIQUE: Multidetector CT imaging of the head and cervical spine was performed following the standard protocol without intravenous contrast. Multiplanar CT image reconstructions of the cervical spine were also generated. COMPARISON:  01/21/2021 FINDINGS: CT HEAD FINDINGS Brain: No evidence of acute infarction, hemorrhage, hydrocephalus, extra-axial collection or mass lesion/mass effect. Mild atrophy and mild chronic white matter changes. Vascular: Negative for hyperdense vessel. Mild calcification right M1 segment. Skull: Negative Sinuses/Orbits: Mild mucosal edema paranasal sinuses. Bilateral cataract extraction hospital Other: None CT CERVICAL SPINE FINDINGS Alignment: Mild anterolisthesis C3-4 Skull base and vertebrae: Negative for fracture Soft tissues and spinal canal: Negative for soft tissue swelling or mass. Disc levels: Disc degeneration and spurring C4-5, C5-6, C6-7. Mild foraminal narrowing bilaterally C5-6 and C6-7. Upper chest: Lung apices clear bilaterally Other: None IMPRESSION: 1. No acute intracranial abnormality 2. Negative for cervical spine fracture. Electronically Signed   By: Franchot Gallo M.D.   On: 09/16/2021 14:32   CT CERVICAL SPINE WO CONTRAST  Result Date: 09/16/2021 CLINICAL DATA:  Fall at home.  Head trauma. EXAM: CT HEAD WITHOUT CONTRAST CT CERVICAL SPINE WITHOUT CONTRAST TECHNIQUE: Multidetector CT  imaging of the head and cervical spine was performed following the standard protocol without intravenous contrast. Multiplanar CT image reconstructions of the cervical spine were also generated. COMPARISON:  01/21/2021 FINDINGS: CT HEAD FINDINGS Brain: No evidence of acute infarction, hemorrhage, hydrocephalus, extra-axial collection or mass lesion/mass effect. Mild atrophy and mild chronic white matter changes. Vascular: Negative  for hyperdense vessel. Mild calcification right M1 segment. Skull: Negative Sinuses/Orbits: Mild mucosal edema paranasal sinuses. Bilateral cataract extraction hospital Other: None CT CERVICAL SPINE FINDINGS Alignment: Mild anterolisthesis C3-4 Skull base and vertebrae: Negative for fracture Soft tissues and spinal canal: Negative for soft tissue swelling or mass. Disc levels: Disc degeneration and spurring C4-5, C5-6, C6-7. Mild foraminal narrowing bilaterally C5-6 and C6-7. Upper chest: Lung apices clear bilaterally Other: None IMPRESSION: 1. No acute intracranial abnormality 2. Negative for cervical spine fracture. Electronically Signed   By: Franchot Gallo M.D.   On: 09/16/2021 14:32   DG Hip Unilat With Pelvis 2-3 Views Left  Result Date: 09/16/2021 CLINICAL DATA:  Fall.  Left hip pain. EXAM: DG HIP (WITH OR WITHOUT PELVIS) 2-3V LEFT COMPARISON:  01/25/2019 FINDINGS: There is diffuse decreased bone mineralization. New acute comminuted fracture of the left proximal femoral intertrochanteric region with mild-to-moderate impaction and mild varus angulation. Mild bilateral femoroacetabular joint space narrowing. Moderate right L4-5 disc space narrowing. IMPRESSION: Acute, comminuted left intertrochanteric fracture. Electronically Signed   By: Yvonne Kendall   On: 09/16/2021 14:04    Procedures Procedures    Medications Ordered in ED Medications  sodium chloride 0.9 % bolus 1,000 mL (1,000 mLs Intravenous New Bag/Given 09/16/21 1448)  naloxone [facility]) injection 1 mg (1 mg  Intravenous Given 09/16/21 1310)  ondansetron (ZOFRAN) injection 4 mg (4 mg Intravenous Given 09/16/21 1311)    ED Course/ Medical Decision Making/ A&P                           Medical Decision Making  Pt given 125 mcg fentanyl en route.  When he arrived, he was confused and not able to answer any questions.  His O2 sat was 82%, so he was put on oxygen.  I also had the nurse give him 1 mg narcan IV.  Pt is now more alert and is fully able to communicate.  Pain has been well controlled and he has not required any additional pain meds.  Results of labs and xrays reviewed.  He has CKD and some mild hyperkalemia.  Pt given IVFs.  No other injuries other than a hip fx.  CXR does show a possible FB.  It is external to the patient.  He was wearing a necklace with his wedding ring and a charm.  This is taken off the patient and put in his belongings by the nurse.  Pt does have a left IT fx.  He was d/w PA Jacqulynn Cadet (ortho) who will come and see pt.  He is posted for OR for today.  Pt d/w FP (unassigned) for admission.        Final Clinical Impression(s) / ED Diagnoses Final diagnoses:  Closed nondisplaced intertrochanteric fracture of left femur, initial encounter ([place])  Stage 3b chronic kidney disease ([place])  Accidental fentanyl overdose, initial encounter [facility])    Rx / [place] Orders ED Discharge Orders     None         Isla Pence, MD 09/16/21 1547

## 2021-09-16 NOTE — Consult Note (Signed)
Reason for Consult:Left hip fx Referring Physician: Isla Pence Time called: 2426 Time at bedside: [name] is an 76 y.o. male.  HPI: Patrick Turner was at home and his knees gave way. He fell into a doorway and hit the jamb on the way down. He's unsure what caused it but had immediate pain in his left hip and could not get up. He was brought to the ED where x-rays showed a left hip fx and orthopedic surgery was consulted. He lives at home with his wife, does not use assistive devices to ambulate, and works at [employer].  Past Medical History:  Diagnosis Date   Altered mental status    Anemia    Anxiety    Aortic atherosclerosis (HCC)    Arthritis    Coronary artery disease    CRD (chronic renal disease)    Depression    Diabetes mellitus without complication (HCC)    Encephalopathy acute    GERD (gastroesophageal reflux disease)    Headache    Hypercholesteremia    Hypertension    Neuropathy    Severe obesity ([place])    Sleep apnea    Sleep terror    per patient this year per patient     Past Surgical History:  Procedure Laterality Date   APPENDECTOMY     CIRCUMCISION     COLONOSCOPY     COLONOSCOPY WITH PROPOFOL N/A 07/10/2018   Procedure: COLONOSCOPY WITH PROPOFOL;  Surgeon: Lollie Sails, MD;  Location: [facility] ENDOSCOPY;  Service: Endoscopy;  Laterality: N/A;   COLONOSCOPY WITH PROPOFOL N/A 04/23/2019   Procedure: COLONOSCOPY WITH PROPOFOL;  Surgeon: Lollie Sails, MD;  Location: [facility] ENDOSCOPY;  Service: Endoscopy;  Laterality: N/A;   CORONARY STENT INTERVENTION N/A 10/03/2019   Procedure: CORONARY STENT INTERVENTION;  Surgeon: Yolonda Kida, MD;  Location: [place] CV LAB;  Service: Cardiovascular;  Laterality: N/A;   ESOPHAGOGASTRODUODENOSCOPY     ESOPHAGOGASTRODUODENOSCOPY (EGD) WITH PROPOFOL N/A 04/23/2019   Procedure: ESOPHAGOGASTRODUODENOSCOPY (EGD) WITH PROPOFOL;  Surgeon: Lollie Sails, MD;  Location: [facility] ENDOSCOPY;  Service:  Endoscopy;  Laterality: N/A;   JOINT REPLACEMENT     KNEE ARTHROPLASTY Right 06/27/2016   Procedure: COMPUTER ASSISTED TOTAL KNEE ARTHROPLASTY;  Surgeon: Dereck Leep, MD;  Location: [facility] ORS;  Service: Orthopedics;  Laterality: Right;   KNEE ARTHROSCOPY Right    LEFT HEART CATH AND CORONARY ANGIOGRAPHY N/A 10/03/2019   Procedure: Left Heart Cath and possible Coronary intervention;  Surgeon: Dionisio David, MD;  Location: [facility];  Service: Cardiovascular;  Laterality: N/A;   TONSILLECTOMY     VISCERAL ANGIOGRAPHY N/A 05/23/2019   Procedure: VISCERAL ANGIOGRAPHY;  Surgeon: Algernon Huxley, MD;  Location: [place] [facility];  Service: Cardiovascular;  Laterality: N/A;   VISCERAL ANGIOGRAPHY N/A 11/04/2019   Procedure: VISCERAL ANGIOGRAPHY;  Surgeon: Algernon Huxley, MD;  Location: [place] [facility];  Service: Cardiovascular;  Laterality: N/A;    Family History  Problem Relation Age of Onset   Diabetes Mother    Heart disease Mother    Cancer Father    COPD Father     Social History:  reports that he has never smoked. He has never used smokeless tobacco. He reports current alcohol use. He reports that he does not use drugs.  Allergies:  Allergies  Allergen Reactions   Penicillins Anaphylaxis    Has patient had a PCN reaction causing immediate rash, facial/tongue/throat swelling, SOB or lightheadedness  with hypotension: Yes Has patient had a PCN reaction causing severe rash involving mucus membranes or skin necrosis: No Has patient had a PCN reaction that required hospitalization Yes Has patient had a PCN reaction occurring within the last 10 years: No If all of the above answers are "NO", then may proceed with Cephalosporin use.   Tavist [Clemastine] Swelling    Medications: I have reviewed the patient's current medications.  Results for orders placed or performed during the hospital encounter of 09/16/21 (from the past 48 hour(s))  CBG monitoring, ED     Status:  Abnormal   Collection Time: 09/16/21  1:00 PM  Result Value Ref Range   Glucose-Capillary 248 (H) 70 - 99 mg/dL    Comment: Glucose reference range applies only to samples taken after fasting for at least 8 hours.    DG Chest 1 View  Result Date: 09/16/2021 CLINICAL DATA:  Provided history: Fall. Additional history provided: Episode of near syncope (with head trauma). Patient reports left hip pain. EXAM: CHEST  1 VIEW COMPARISON:  CT angiogram chest/abdomen/pelvis 10/23/2020. Prior chest radiographs 10/23/2020 and earlier. FINDINGS: Heart size within normal limits. No appreciable airspace consolidation. No evidence of pleural effusion or pneumothorax. No acute bony abnormality identified. A 2.3 cm round to ovoid radiodense foreign body projects over the midline lower neck. Impression #1 was called by telephone at the time of interpretation on 09/16/2021 at 2:06 pm to provider [facility] , who verbally acknowledged these results. IMPRESSION: A 2.3 cm round to ovoid radiodense foreign body projects over the midline lower neck. This may overlie the patient. However, an aspirated/ingested foreign body cannot be excluded, and clinical correlation is recommended. Otherwise, no evidence of acute cardiopulmonary abnormality. Electronically Signed   By: Kellie Simmering D.O.   On: 09/16/2021 14:06   DG Knee 2 Views Left  Result Date: 09/16/2021 CLINICAL DATA:  Fall EXAM: LEFT KNEE - 1-2 VIEW COMPARISON:  None. FINDINGS: Suboptimal positioning. No fracture identified. True lateral view not obtained. Advanced joint space narrowing medially with associated spurring. Mild widening of the lateral joint space. Arterial calcification IMPRESSION: Negative for fracture.  Suboptimal positioning. Electronically Signed   By: Franchot Gallo M.D.   On: 09/16/2021 14:02   CT HEAD WO CONTRAST  Result Date: 09/16/2021 CLINICAL DATA:  Fall at home.  Head trauma. EXAM: CT HEAD WITHOUT CONTRAST CT CERVICAL SPINE WITHOUT CONTRAST  TECHNIQUE: Multidetector CT imaging of the head and cervical spine was performed following the standard protocol without intravenous contrast. Multiplanar CT image reconstructions of the cervical spine were also generated. COMPARISON:  01/21/2021 FINDINGS: CT HEAD FINDINGS Brain: No evidence of acute infarction, hemorrhage, hydrocephalus, extra-axial collection or mass lesion/mass effect. Mild atrophy and mild chronic white matter changes. Vascular: Negative for hyperdense vessel. Mild calcification right M1 segment. Skull: Negative Sinuses/Orbits: Mild mucosal edema paranasal sinuses. Bilateral cataract extraction hospital Other: None CT CERVICAL SPINE FINDINGS Alignment: Mild anterolisthesis C3-4 Skull base and vertebrae: Negative for fracture Soft tissues and spinal canal: Negative for soft tissue swelling or mass. Disc levels: Disc degeneration and spurring C4-5, C5-6, C6-7. Mild foraminal narrowing bilaterally C5-6 and C6-7. Upper chest: Lung apices clear bilaterally Other: None IMPRESSION: 1. No acute intracranial abnormality 2. Negative for cervical spine fracture. Electronically Signed   By: Franchot Gallo M.D.   On: 09/16/2021 14:32   CT CERVICAL SPINE WO CONTRAST  Result Date: 09/16/2021 CLINICAL DATA:  Fall at home.  Head trauma. EXAM: CT HEAD WITHOUT CONTRAST CT CERVICAL SPINE  WITHOUT CONTRAST TECHNIQUE: Multidetector CT imaging of the head and cervical spine was performed following the standard protocol without intravenous contrast. Multiplanar CT image reconstructions of the cervical spine were also generated. COMPARISON:  01/21/2021 FINDINGS: CT HEAD FINDINGS Brain: No evidence of acute infarction, hemorrhage, hydrocephalus, extra-axial collection or mass lesion/mass effect. Mild atrophy and mild chronic white matter changes. Vascular: Negative for hyperdense vessel. Mild calcification right M1 segment. Skull: Negative Sinuses/Orbits: Mild mucosal edema paranasal sinuses. Bilateral cataract  extraction hospital Other: None CT CERVICAL SPINE FINDINGS Alignment: Mild anterolisthesis C3-4 Skull base and vertebrae: Negative for fracture Soft tissues and spinal canal: Negative for soft tissue swelling or mass. Disc levels: Disc degeneration and spurring C4-5, C5-6, C6-7. Mild foraminal narrowing bilaterally C5-6 and C6-7. Upper chest: Lung apices clear bilaterally Other: None IMPRESSION: 1. No acute intracranial abnormality 2. Negative for cervical spine fracture. Electronically Signed   By: Franchot Gallo M.D.   On: 09/16/2021 14:32   DG Hip Unilat With Pelvis 2-3 Views Left  Result Date: 09/16/2021 CLINICAL DATA:  Fall.  Left hip pain. EXAM: DG HIP (WITH OR WITHOUT PELVIS) 2-3V LEFT COMPARISON:  01/25/2019 FINDINGS: There is diffuse decreased bone mineralization. New acute comminuted fracture of the left proximal femoral intertrochanteric region with mild-to-moderate impaction and mild varus angulation. Mild bilateral femoroacetabular joint space narrowing. Moderate right L4-5 disc space narrowing. IMPRESSION: Acute, comminuted left intertrochanteric fracture. Electronically Signed   By: Yvonne Kendall   On: 09/16/2021 14:04    Review of Systems  HENT:  Negative for ear discharge, ear pain, hearing loss and tinnitus.   Eyes:  Negative for photophobia and pain.  Respiratory:  Negative for cough and shortness of breath.   Cardiovascular:  Negative for chest pain.  Gastrointestinal:  Negative for abdominal pain, nausea and vomiting.  Genitourinary:  Negative for dysuria, flank pain, frequency and urgency.  Musculoskeletal:  Positive for arthralgias (Left hip). Negative for back pain, myalgias and neck pain.  Neurological:  Negative for dizziness and headaches.  Hematological:  Does not bruise/bleed easily.  Psychiatric/Behavioral:  The patient is not nervous/anxious.   Blood pressure (!) 143/52, pulse 69, temperature (!) 96.7 F (35.9 C), temperature source Axillary, resp. rate 15, SpO2 95  %. Physical Exam Constitutional:      General: He is not in acute distress.    Appearance: He is well-developed. He is not diaphoretic.  HENT:     Head: Normocephalic and atraumatic.  Eyes:     General: No scleral icterus.       Right eye: No discharge.        Left eye: No discharge.     Conjunctiva/sclera: Conjunctivae normal.  Cardiovascular:     Rate and Rhythm: Normal rate and regular rhythm.  Pulmonary:     Effort: Pulmonary effort is normal. No respiratory distress.  Musculoskeletal:     Cervical back: Normal range of motion.     Comments: LLE No traumatic wounds, ecchymosis, or rash  Mod TTP hip  No knee or ankle effusion  Knee stable to varus/ valgus and anterior/posterior stress  Sens DPN, SPN, TN intact  Motor EHL, ext, flex, evers 5/5  DP 2+, PT 1, No significant edema  Skin:    General: Skin is warm and dry.  Neurological:     Mental Status: He is alert.  Psychiatric:        Mood and Affect: Mood normal.        Behavior: Behavior normal.    Assessment/Plan: Left  hip fx -- Plan IMN today by Dr. Zachery Dakins. Please keep NPO.    Lisette Abu, PA-C Orthopedic Surgery [phone] 09/16/2021, 2:46 PM

## 2021-09-16 NOTE — Plan of Care (Signed)
  Problem: Education: Goal: Knowledge of General Education information will improve Description: Including pain rating scale, medication(s)/side effects and non-pharmacologic comfort measures Outcome: Progressing   Problem: Pain Managment: Goal: General experience of comfort will improve Outcome: Progressing   

## 2021-09-16 NOTE — ED Notes (Signed)
Notified MD of increased lethargy, decreased SaO2. MD to bedside. Verbal order for 1 mg Narcan and 4 mg Zofran IV.

## 2021-09-16 NOTE — H&P (Addendum)
[facility] Admission History and Physical Service Pager: [phone]  Patient name: Patrick Turner Medical record number: [id number] Date of birth: [birth year] Age: 76 y.o. Gender: male  Primary Care Provider: Kirk Ruths, MD Consultants: Orthopedic Surgery  Code Status: FULL which was confirmed with family if patient unable to confirm   Preferred Emergency Contact: Clarnce Homan [phone]  Chief Complaint: Left Hip Fracture  Assessment and Plan: JONTAVIOUS COMMONS is a 76 y.o. male presenting with left hip fracture. PMH is HTN, HLD, GERD, CKD, type 2 diabetes, OSA, hyperkalemia, frequent falls.  Fall   Pre-Syncope Patient was brought to ED by EMS after fall at home and found to have left trochanteric hip fracture. He stated he fell backwards and hit his head but did not lose consciousness. In the ED patient labs showed hyperkalemia, elevated creatinine and low calcium at 8.5. Hemoglobin mildly low at 11.1 but seems to be around baseline for him.  Vital signs showed elevated blood pressures to 637-858 systolic over 85O-27X diastolic and some hypoxia to 82% which resolved and currently saturating 100% on room air.  CT cervical spine, CT head, Left Knee XR all within normal limits. Chest x-ray negative for any fractures but had foreign body which was found to be a necklace. Left Hip XR with acute, comminuted left intertrochanteric fracture talked about in problem below. On exam patient had soft systolic murmur and externally rotated hip with shortened limb and pain with small movements.  Differential includes syncope due to orthostatic hypotension given patient on multiple antihypertensives and diastolics in the 41O to 87O. Cardiac causes a possibility given soft systolic murmur on examination but no chest pain, denies palpitations and last echo per anesthesia event note 03/19/2020 demonstrated "EF 67-67%, grade 1 diastolic dysfunction, severe LA dilation, RV  systolic dysfunction, moderate AR, moderate TR with moderate pulmonary HTN and mild-moderate MR".  Head trauma possibility given fall however CT of head was negative although patient says speech is worse than his usual neurologic examination was normal and patient alert and oriented. Denies any history of seizures but does have sleep terrors. 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 Will admit and attempt to optimize for surgery and pursue possible cause of pre-syncope/dizziness.  -Admit to FPTS attending Dr. Thompson Grayer, MedSurg -Continuous cardiac monitoring -Consider repeat echo -AM BMP, CBC -Orthostatic vitals when stabilized pain -Consider repeat echo -PT/OT eval  Left trochanteric hip fracture Orthopedics consulted by ED provider, plan for surgery in the AM given patient had eaten earlier in the day.  Externally rotated hip and shortened limb on exam with severe pain with any movement. Of note, given fentanyl with EMS and had some altered mental status but returned to baseline after narcan. Would continue to avoid fentanyl at this time.  -Pain control-dilaudid 0.1 mg q3h, watch for break through pain -Prophylactic vancomycin per orthopedics -NPO except sips with meds -Bedrest  Drug-induced parkinsonism Diagnosed in 2015 that the "antipsychotic medication which might be the cause of his symptoms he does have bradykinesia bradyphrenia, walking very slowly chin tremors. As well as a REM behavior disorder, gait impairment and easy falling." Last MRI in 2020 showed mild ischemic changes involving the basal ganglia and thalami and chronic ethmoid sinus disease. Asked patient which medication this was but was unsure which medication had caused this for him in the past. Neuro exam intact, patient had some stutters with words and some chin tremors on exam today. He says that this  is worse than his normal. Alert and oriented x 4. -Monitor on neuro exams -Consider  additional head imaging if any neuro deficits on exam/clinical status -Avoid antipsychotics in hospital  T2DM Last A1c of 7.8 on 06/14/21.  Home medications of metformin 500 mg twice daily and insulin Levemir 15 units at bedtime. -mSSI -A1c  Accidental fentanyl overdose (in EMS) Patient was given 125 mcg fentanyl in EMS route.  In ED had confusion and could not answer questions with O2 sats in 82% which he was put on oxygen for. Was given 1 mg narcan and resolution of symptoms. Alert and oriented on examination for me with no shortness of breath on exam.  -Monitor mental status  -Monitor respiratory status -Avoid fentanyl  HTN Elevated BP to 062-376E systolics of 83T to 51V. Home medications of carvedilol 6.25 mg BID, losartan 25 mg daily, and metoprolol 25 mg nightly. Diastolics low, could be potential cause of fall.  -Continue home medications while patient in bed and not ambulating  -Consider altering medication with orthostatics   Hyperkalemia 5.5 in ED today. No palpitations or chest pain.  -Lokelma 10 mg - AM BMP   CKD Stage 3a Creatinine of 1.82 and received 1 fluid bolus in ED. Baseline Cr this year 1.6-1.8 this year.  -Avoid nephrotoxic agents -Monitor on AM BMP  Dysuria For the last 2 months says it hurts while urinating -f/u UA, UCx  Left Leg Rash Circular rash on left leg. Differential includes possible nummular eczema, psoriasis, fungal etiology. -Monitor  Obesity BMI 30.62. Home medications of ozempic.  Depression Home medications of paxil 40 mg -Continue home medications  GERD Home protonix 40 mg. -Continue home medications  Coronary Artery Disease with Stable Angina  Home medications of plavix that was prescribed after an ACS not taking currently and Ranexa for anginal pain. Hx of 3 heart stents.  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  Not currently having any GI bleeding. -Monitor  OSA Not on CPAP -Monitor  FEN/GI: NPO sips with meds Prophylaxis: heparin  Disposition: med-surg  History of Present Illness:  Patrick Turner is a 76 y.o. male presenting with a left hip fracture. Was finishing breakfast around 11 and was going to work. Was sliding down the stairs on his bottom. Got to the kitchen and leg buckled. He fell backward and hit his head. Not sure how he injured his hip. Attempted to get up but could not move his left leg. Wife thought he bruised his leg. She then called EMS because she was unable to pick patient up off the floor. Attempted to sit up with EMS and had increased pain in his left leg.   Has had surgery on his right hip/knee? And went through PT for this to strengthen this side.   Hx of sleep terrors for 2 weeks.   Denies palpitations but endorses dizziness with standing.   Denies history of stroke. Endorses his speech is not normal.   Dysuria for a few months.   Review Of Systems: Per HPI with the following additions:   Review of Systems  Constitutional:  Negative for fever.  Respiratory:  Negative for shortness of breath.   Gastrointestinal:  Negative for abdominal pain, constipation and diarrhea.  Genitourinary:  Positive for difficulty urinating and dysuria. Negative for hematuria.  Musculoskeletal:  Positive for arthralgias, gait problem and myalgias.  Neurological:  Positive for  dizziness and speech difficulty.  Psychiatric/Behavioral:  Negative for suicidal ideas.     Patient Active Problem List   Diagnosis Date Noted   Anemia in chronic kidney disease 05/28/2020   Benign hypertensive kidney disease with chronic kidney disease 05/28/2020   Proteinuria 05/28/2020   Falls frequently 04/27/2020   Use of cane as ambulatory aid 04/27/2020   Numbness and tingling of both legs 02/18/2020   Primary osteoarthritis of left knee 01/04/2020   Diabetic retinopathy ([place]) 01/01/2020   ASCVD  (arteriosclerotic cardiovascular disease) 10/16/2019   Hospitalization within last 30 days 10/16/2019   Hyperkalemia 10/14/2019   Pseudoaneurysm of right femoral artery ([place]) 10/14/2019   AKI (acute kidney injury) ([place]) 10/13/2019   Unstable angina ([place]) 10/02/2019   Chest pain 10/01/2019   Uncontrolled type 2 diabetes mellitus with hyperglycemia, with long-term current use of insulin ([place]) 09/27/2019   Chronic mesenteric ischemia ([place]) 05/06/2019   Abdominal pain 05/03/2019   Acute on chronic renal failure ([place]) 03/23/2019   AMS (altered mental status) 12/24/2018   Dizziness 06/30/2018   HLD (hyperlipidemia) 02/23/2018   Aortic atherosclerosis ([place]) 11/02/2017   Healthcare maintenance 12/26/2016   DM type 2 with diabetic peripheral neuropathy ([place]) 11/09/2016   Presence of right artificial knee joint 08/14/2016   Bilateral carotid artery disease ([place]) 07/12/2016   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   Acute diarrhea 05/30/2016   Encephalopathy acute 05/30/2016   Anemia 05/30/2016   Acute on chronic renal insufficiency 05/30/2016   Diarrhea 05/30/2016   Chronic kidney disease, stage 3a ([place]) 03/30/2015   Depression, major, in remission ([place]) 08/02/2014   Severe obesity (BMI 35.0-39.9) with comorbidity ([place]) 07/12/2014   GERD (gastroesophageal reflux disease) 03/08/2014   Hyperlipidemia associated with type 2 diabetes mellitus ([place]) 03/08/2014   Drug-induced parkinsonism ([place]) 01/24/2014   Diabetes mellitus with stage 3 chronic kidney disease ([place]) 01/21/2014   HTN (hypertension), benign 01/21/2014   OSA (obstructive sleep apnea) 01/21/2014   Unspecified transient cerebral ischemia 05/29/2012    Past Medical History: Past Medical History:  Diagnosis Date   Altered mental status    Anemia    Anxiety    Aortic atherosclerosis (HCC)    Arthritis    Coronary artery disease    CRD (chronic renal disease)    Depression    Diabetes mellitus without complication (HCC)     Encephalopathy acute    GERD (gastroesophageal reflux disease)    Headache    Hypercholesteremia    Hypertension    Neuropathy    Severe obesity ([place])    Sleep apnea    Sleep terror    per patient this year per patient     Past Surgical History: Past Surgical History:  Procedure Laterality Date   APPENDECTOMY     CIRCUMCISION     COLONOSCOPY     COLONOSCOPY WITH PROPOFOL N/A 07/10/2018   Procedure: COLONOSCOPY WITH PROPOFOL;  Surgeon: Lollie Sails, MD;  Location: [facility] ENDOSCOPY;  Service: Endoscopy;  Laterality: N/A;   COLONOSCOPY WITH PROPOFOL N/A 04/23/2019   Procedure: COLONOSCOPY WITH PROPOFOL;  Surgeon: Lollie Sails, MD;  Location: [facility] ENDOSCOPY;  Service: Endoscopy;  Laterality: N/A;   CORONARY STENT INTERVENTION N/A 10/03/2019   Procedure: CORONARY STENT INTERVENTION;  Surgeon: Yolonda Kida, MD;  Location: [facility] CV LAB;  Service: Cardiovascular;  Laterality: N/A;   ESOPHAGOGASTRODUODENOSCOPY     ESOPHAGOGASTRODUODENOSCOPY (EGD) WITH PROPOFOL N/A 04/23/2019   Procedure: ESOPHAGOGASTRODUODENOSCOPY (EGD) WITH PROPOFOL;  Surgeon: Lollie Sails, MD;  Location: [facility] ENDOSCOPY;  Service: Endoscopy;  Laterality: N/A;   JOINT REPLACEMENT     KNEE ARTHROPLASTY Right 06/27/2016   Procedure: COMPUTER ASSISTED TOTAL KNEE ARTHROPLASTY;  Surgeon: Dereck Leep, MD;  Location: [facility] ORS;  Service: Orthopedics;  Laterality: Right;   KNEE ARTHROSCOPY Right    LEFT HEART CATH AND CORONARY ANGIOGRAPHY N/A 10/03/2019   Procedure: Left Heart Cath and possible Coronary intervention;  Surgeon: Dionisio David, MD;  Location: [facility] CV LAB;  Service: Cardiovascular;  Laterality: N/A;   TONSILLECTOMY     VISCERAL ANGIOGRAPHY N/A 05/23/2019   Procedure: VISCERAL ANGIOGRAPHY;  Surgeon: Algernon Huxley, MD;  Location: [place] CV LAB;  Service: Cardiovascular;  Laterality: N/A;   VISCERAL ANGIOGRAPHY N/A 11/04/2019   Procedure: VISCERAL ANGIOGRAPHY;  Surgeon: Algernon Huxley, MD;  Location: [place] CV LAB;  Service: Cardiovascular;  Laterality: N/A;    Social History: Social History   Tobacco Use   Smoking status: Never   Smokeless tobacco: Never  Vaping Use   Vaping Use: Never used  Substance Use Topics   Alcohol use: Yes    Comment: rare   Drug use: No   Additional social history: never smoker, ETOH in teen years, Denies hx of illicit drug use.   Please also refer to relevant sections of EMR.  Family History: Family History  Problem Relation Age of Onset   Diabetes Mother    Heart disease Mother    Cancer Father    COPD Father     Allergies and Medications: Allergies  Allergen Reactions   Penicillins Anaphylaxis    Has patient had a PCN reaction causing immediate rash, facial/tongue/throat swelling, SOB or lightheadedness with hypotension: Yes Has patient had a PCN reaction causing severe rash involving mucus membranes or skin necrosis: No Has patient had a PCN reaction that required hospitalization Yes Has patient had a PCN reaction occurring within the last 10 years: No If all of the above answers are "NO", then may proceed with Cephalosporin use.   Tavist [Clemastine] Swelling   No current facility-administered medications on file prior to encounter.   Current Outpatient Medications on File Prior to Encounter  Medication Sig Dispense Refill   ascorbic acid (VITAMIN C) 500 MG tablet Take 500 mg by mouth daily.     aspirin 81 MG tablet Take 1 tablet (81 mg total) by mouth daily.     atorvastatin (LIPITOR) 80 MG tablet Take 80 mg by mouth at bedtime.     carvedilol (COREG) 6.25 MG tablet Take 6.25 mg by mouth 2 (two) times daily with a meal.     Cholecalciferol 50 MCG (2000 UT) TABS Take 2,000 Units by mouth daily.     clindamycin (CLEOCIN) 300 MG capsule Take 300 mg by mouth daily as needed (prior to dental appointments).     clonazePAM (KLONOPIN) 1 MG tablet Take 0.5 mg by mouth at bedtime as needed for anxiety.   2    gabapentin (NEURONTIN) 400 MG capsule Take 400 mg by mouth 2 (two) times daily.     insulin detemir (LEVEMIR) 100 UNIT/ML injection Inject 15 Units into the skin at bedtime.     iron polysaccharides (NIFEREX) 150 MG capsule Take 150 mg by mouth daily.     losartan (COZAAR) 25 MG tablet Take 25 mg by mouth daily.     metFORMIN (GLUCOPHAGE-XR) 500 MG 24 hr tablet Take 500 mg by mouth 2 (two) times daily with a meal. 2 tablets twice  daily with meals     metoprolol tartrate (LOPRESSOR) 25 MG tablet Take 0.5 tablets (12.5 mg total) by mouth 2 (two) times daily. (Patient taking differently: Take 25 mg by mouth at bedtime.) 60 tablet 0   Omega-3 Fatty Acids (FISH OIL) 1000 MG CAPS Take 1,000 mg by mouth 2 (two) times daily.      OZEMPIC, 0.25 OR 0.5 MG/DOSE, 2 MG/1.5ML SOPN Inject 1 Dose into the skin once a week. Wednesdays     pantoprazole (PROTONIX) 40 MG tablet Take 40 mg by mouth daily.     PARoxetine (PAXIL) 40 MG tablet Take 40 mg by mouth daily.      pioglitazone (ACTOS) 15 MG tablet Take 15 mg by mouth daily.      ranolazine (RANEXA) 500 MG 12 hr tablet Take 500 mg by mouth 2 (two) times daily.     vitamin B-12 (CYANOCOBALAMIN) 1000 MCG tablet Take 1,000 mcg by mouth daily.     Vitamin D, Ergocalciferol, (DRISDOL) 1.25 MG (50000 UNIT) CAPS capsule Take 50,000 Units by mouth daily.     ACCU-CHEK AVIVA PLUS test strip Use to test blood sugar twice daily     Accu-Chek Softclix Lancets lancets Use to test blood sugar twice daily     acetaminophen (TYLENOL) 325 MG tablet Take 2 tablets (650 mg total) by mouth every 6 (six) hours as needed for mild pain (or Fever >/= 101). (Patient not taking: Reported on 09/16/2021) 50 tablet 0   clopidogrel (PLAVIX) 75 MG tablet Take 1 tablet (75 mg total) by mouth daily. (Patient not taking: Reported on 02/18/2020) 30 tablet 11    Objective: BP (!) 212/66    Pulse 64    Temp (!) 97.4 F (36.3 C) (Oral)    Resp 17    SpO2 100%  Exam: General: Pleasant 76 year old  man, resting in bed, Non-toxic but has pain when adjusting leg, stutters when speaking Eyes: EOMI no erythema or drainage ENTM: moist mucous membranes, no nasal drainage, hearing aids in place Neck: ROM intact Cardiovascular: Regular rate, sinus rhythm, very soft systolic murmur auscultated, pulses 1+ in LE, cap refill brisk Respiratory: No increased WOB, CTAB, no w/r/c Gastrointestinal: Nondistended, nontender to palpation, BS+ Extremities: Left hip externally rotated, shortened limb Derm: Circular dry rashes only on left leg     Neuro: CN II: PERRL CN III, IV,VI: EOMI CV V: Normal sensation in V1, V2, V3 CVII: Symmetric smile and brow raise CN VIII: Normal hearing CN IX,X: Symmetric palate raise  CN XI: 5/5 shoulder shrug CN XII: Symmetric tongue protrusion  Normal sensation in UE and LE bilaterally  Psych: normal affect  Labs and Imaging: CBC BMET  Recent Labs  Lab 09/16/21 1440  WBC 7.0  HGB 11.1*  HCT 34.6*  PLT 233   Recent Labs  Lab 09/16/21 1440  NA 137  K 5.5*  CL 105  CO2 26  BUN 34*  CREATININE 1.82*  GLUCOSE 209*  CALCIUM 8.5*     EKG: NSR  Gerrit Heck, MD 09/16/2021, 4:31 PM PGY-1, [facility] pager: [phone], text pages welcome   FPTS Upper-Level Resident Addendum   I have independently interviewed and examined the patient. I have discussed the above with the original author and agree with their documentation. My edits for correction/addition/clarification are in within the documentation. Please see also any attending notes.   Gerlene Fee, DO PGY-3, [facility] 09/16/2021 7:33 PM  [place] Service pager: [phone] (  text pages welcome through [facility])

## 2021-09-16 NOTE — ED Triage Notes (Signed)
Per EMS wife called after near syncope episode. Pt hit head on corner of nightstand.   Shortening and rotation to the left side. Pt complain of left hip pain.  GCS of 15   159mcg fent, last 33mcg given at 1245  HR 60's SR per EMS BP 182/70 O2: 94% RA CBG 293  Hx: depression, HTN, DM  20RAC

## 2021-09-16 NOTE — Progress Notes (Signed)
FPTS Brief Progress Note  S: Patrick Turner was awake and laying in bed upon arrival during night rounds.  He was present and endorses left hip pain which he rates about 5 out of 10 with movement and 2 out of 10 when he is laying still.  He denies any lightheadedness or shortness of breath.  No complaint at this time.   O: BP (!) 159/57 (BP Location: Left Arm)    Pulse 68    Temp 98.2 F (36.8 C) (Oral)    Resp 20    Ht 5\' 5"  (1.651 m)    Wt 83.5 kg    SpO2 100%    BMI 30.62 kg/m   General:Awake, well appearing, NAD HEENT: Atraumatic, MMM CV: RRR, no murmurs, normal S1/S2 Pulm: CTAB, good WOB on RA Abd: Soft, no distension, no tenderness Skin: dry, warm Ext: No BLE edema, +2 Pedal and radial pulse.    A/P: -Patient is n.p.o. in anticipation for surgery tomorrow - Continue plan per day team - Orders reviewed. Labs for AM ordered, which was adjusted as needed.    Alen Bleacher, MD 09/16/2021, 11:11 PM PGY-1, [facility] Night Resident  Please page [phone] with questions.

## 2021-09-17 ENCOUNTER — Encounter (HOSPITAL_COMMUNITY): Payer: Self-pay | Admitting: Student

## 2021-09-17 ENCOUNTER — Inpatient Hospital Stay (HOSPITAL_COMMUNITY): Payer: Medicare HMO | Admitting: Certified Registered"

## 2021-09-17 ENCOUNTER — Inpatient Hospital Stay (HOSPITAL_COMMUNITY): Payer: Medicare HMO

## 2021-09-17 ENCOUNTER — Encounter (HOSPITAL_COMMUNITY)
Admission: EM | Disposition: A | Discharge status: Home Health | Payer: Self-pay | Source: Home / Self Care | Attending: Family Medicine

## 2021-09-17 DIAGNOSIS — R55 Syncope and collapse: Secondary | ICD-10-CM

## 2021-09-17 HISTORY — PX: INTRAMEDULLARY (IM) NAIL INTERTROCHANTERIC: SHX5875

## 2021-09-17 LAB — ECHOCARDIOGRAM COMPLETE
AR max vel: 2.14 cm2
AV Area VTI: 2.36 cm2
AV Area mean vel: 2.37 cm2
AV Mean grad: 5.5 mmHg
AV Peak grad: 13.3 mmHg
Ao pk vel: 1.83 m/s
Area-P 1/2: 3.2 cm2
Height: 65 in
S' Lateral: 2.9 cm
Weight: 2944 oz

## 2021-09-17 LAB — BASIC METABOLIC PANEL
Anion gap: 7 (ref 5–15)
Anion gap: 9 (ref 5–15)
BUN: 27 mg/dL — ABNORMAL HIGH (ref 8–23)
BUN: 21 mg/dL (ref 8–23)
CO2: 24 mmol/L (ref 22–32)
CO2: 22 mmol/L (ref 22–32)
Calcium: 8.5 mg/dL — ABNORMAL LOW (ref 8.9–10.3)
Calcium: 9.1 mg/dL (ref 8.9–10.3)
Chloride: 105 mmol/L (ref 98–111)
Chloride: 105 mmol/L (ref 98–111)
Creatinine, Ser: 1.45 mg/dL — ABNORMAL HIGH (ref 0.61–1.24)
Creatinine, Ser: 1.43 mg/dL — ABNORMAL HIGH (ref 0.61–1.24)
GFR, Estimated: 50 mL/min — ABNORMAL LOW (ref >60)
GFR, Estimated: 51 mL/min — ABNORMAL LOW (ref >60)
Glucose, Bld: 255 mg/dL — ABNORMAL HIGH (ref 70–99)
Glucose, Bld: 162 mg/dL — ABNORMAL HIGH (ref 70–99)
Potassium: 5.3 mmol/L — ABNORMAL HIGH (ref 3.5–5.1)
Potassium: 4.5 mmol/L (ref 3.5–5.1)
Sodium: 136 mmol/L (ref 135–145)
Sodium: 136 mmol/L (ref 135–145)

## 2021-09-17 LAB — CBC
HCT: 34 % — ABNORMAL LOW (ref 39.0–52.0)
Hemoglobin: 11.3 g/dL — ABNORMAL LOW (ref 13.0–17.0)
MCH: 31.1 pg (ref 26.0–34.0)
MCHC: 33.2 g/dL (ref 30.0–36.0)
MCV: 93.7 fL (ref 80.0–100.0)
Platelets: 247 10*3/uL (ref 150–400)
RBC: 3.63 MIL/uL — ABNORMAL LOW (ref 4.22–5.81)
RDW: 15.4 % (ref 11.5–15.5)
WBC: 8.5 10*3/uL (ref 4.0–10.5)
nRBC: 0 % (ref 0.0–0.2)

## 2021-09-17 LAB — HEMOGLOBIN A1C
Hgb A1c MFr Bld: 7.6 % — ABNORMAL HIGH (ref 4.8–5.6)
Mean Plasma Glucose: 171.42 mg/dL

## 2021-09-17 LAB — GLUCOSE, CAPILLARY
Glucose-Capillary: 252 mg/dL — ABNORMAL HIGH (ref 70–99)
Glucose-Capillary: 269 mg/dL — ABNORMAL HIGH (ref 70–99)
Glucose-Capillary: 129 mg/dL — ABNORMAL HIGH (ref 70–99)
Glucose-Capillary: 170 mg/dL — ABNORMAL HIGH (ref 70–99)
Glucose-Capillary: 158 mg/dL — ABNORMAL HIGH (ref 70–99)
Glucose-Capillary: 181 mg/dL — ABNORMAL HIGH (ref 70–99)

## 2021-09-17 LAB — TROPONIN I (HIGH SENSITIVITY): Troponin I (High Sensitivity): 4 ng/L (ref <18)

## 2021-09-17 SURGERY — FIXATION, FRACTURE, INTERTROCHANTERIC, WITH INTRAMEDULLARY ROD
Anesthesia: General | Site: Hip | Laterality: Left

## 2021-09-17 MED ORDER — FENTANYL CITRATE (PF) 100 MCG/2ML IJ SOLN: 50.0000 ug | Freq: Once | INTRAMUSCULAR | Status: AC

## 2021-09-17 MED ORDER — SUGAMMADEX SODIUM 200 MG/2ML IV SOLN
INTRAVENOUS | Status: DC | PRN
  Administered 2021-09-17: 200 mg via INTRAVENOUS

## 2021-09-17 MED ORDER — SODIUM CHLORIDE 0.9 % IR SOLN
Status: DC | PRN
  Administered 2021-09-17: 1000 mL

## 2021-09-17 MED ORDER — VITAMIN D 25 MCG (1000 UNIT) PO TABS
1000.0000 [IU] | ORAL_TABLET | Freq: Every day | ORAL | Status: DC
  Administered 2021-09-18 – 2021-09-22 (×5): 1000 [IU] via ORAL
  Filled 2021-09-17 (×5): qty 1

## 2021-09-17 MED ORDER — ALBUMIN HUMAN 5 % IV SOLN
INTRAVENOUS | Status: DC | PRN
Start: 2021-09-17 — End: 2021-09-17

## 2021-09-17 MED ORDER — ASPIRIN EC 81 MG PO TBEC: 81.0000 mg | DELAYED_RELEASE_TABLET | Freq: Every day | ORAL | Status: DC

## 2021-09-17 MED ORDER — HYDROMORPHONE HCL 1 MG/ML IJ SOLN
0.2000 mg | INTRAMUSCULAR | Status: DC | PRN
  Administered 2021-09-17 – 2021-09-18 (×3): 0.2 mg via INTRAVENOUS
  Filled 2021-09-17 (×3): qty 0.5

## 2021-09-17 MED ORDER — FENTANYL CITRATE (PF) 100 MCG/2ML IJ SOLN
INTRAMUSCULAR | Status: AC
  Administered 2021-09-17: 50 ug via INTRAVENOUS
  Filled 2021-09-17: qty 2

## 2021-09-17 MED ORDER — HYDROMORPHONE HCL 1 MG/ML IJ SOLN
INTRAMUSCULAR | Status: AC
  Filled 2021-09-17: qty 1

## 2021-09-17 MED ORDER — ONDANSETRON HCL 4 MG/2ML IJ SOLN: 4.0000 mg | Freq: Once | INTRAMUSCULAR | Status: DC | PRN

## 2021-09-17 MED ORDER — BUPIVACAINE HCL (PF) 0.25 % IJ SOLN
INTRAMUSCULAR | Status: DC | PRN
  Administered 2021-09-17: 20 mL

## 2021-09-17 MED ORDER — ONDANSETRON HCL 4 MG/2ML IJ SOLN
INTRAMUSCULAR | Status: DC | PRN
  Administered 2021-09-17: 4 mg via INTRAVENOUS

## 2021-09-17 MED ORDER — FENTANYL CITRATE (PF) 250 MCG/5ML IJ SOLN
INTRAMUSCULAR | Status: AC
  Filled 2021-09-17: qty 5

## 2021-09-17 MED ORDER — ROPIVACAINE HCL 5 MG/ML IJ SOLN
INTRAMUSCULAR | Status: DC | PRN
  Administered 2021-09-17: 20 mL via PERINEURAL

## 2021-09-17 MED ORDER — FENTANYL CITRATE (PF) 250 MCG/5ML IJ SOLN
INTRAMUSCULAR | Status: DC | PRN
  Administered 2021-09-17 (×3): 50 ug via INTRAVENOUS

## 2021-09-17 MED ORDER — ROCURONIUM BROMIDE 10 MG/ML (PF) SYRINGE
PREFILLED_SYRINGE | INTRAVENOUS | Status: DC | PRN
  Administered 2021-09-17: 60 mg via INTRAVENOUS

## 2021-09-17 MED ORDER — LIDOCAINE 2% (20 MG/ML) 5 ML SYRINGE
INTRAMUSCULAR | Status: DC | PRN
  Administered 2021-09-17: 100 mg via INTRAVENOUS

## 2021-09-17 MED ORDER — VANCOMYCIN HCL 500 MG IV SOLR
INTRAVENOUS | Status: AC
  Filled 2021-09-17: qty 10

## 2021-09-17 MED ORDER — VANCOMYCIN HCL 1500 MG/300ML IV SOLN
1500.0000 mg | INTRAVENOUS | Status: AC
  Administered 2021-09-17: 1500 mg via INTRAVENOUS
  Filled 2021-09-17 (×2): qty 300

## 2021-09-17 MED ORDER — PHENYLEPHRINE HCL-NACL 20-0.9 MG/250ML-% IV SOLN
INTRAVENOUS | Status: DC | PRN
  Administered 2021-09-17: 25 ug/min via INTRAVENOUS

## 2021-09-17 MED ORDER — DEXAMETHASONE SODIUM PHOSPHATE 10 MG/ML IJ SOLN
INTRAMUSCULAR | Status: DC | PRN
Start: 2021-09-17 — End: 2021-09-17
  Administered 2021-09-17: 4 mg via INTRAVENOUS

## 2021-09-17 MED ORDER — CLOPIDOGREL BISULFATE 75 MG PO TABS: 75.0000 mg | ORAL_TABLET | Freq: Every day | ORAL | Status: DC

## 2021-09-17 MED ORDER — ORAL CARE MOUTH RINSE: 15.0000 mL | Freq: Once | OROMUCOSAL | Status: AC

## 2021-09-17 MED ORDER — HYDROMORPHONE HCL 1 MG/ML IJ SOLN
0.2500 mg | INTRAMUSCULAR | Status: DC | PRN
  Administered 2021-09-17: 0.25 mg via INTRAVENOUS

## 2021-09-17 MED ORDER — PROPOFOL 10 MG/ML IV BOLUS
INTRAVENOUS | Status: DC | PRN
Start: 2021-09-17 — End: 2021-09-17
  Administered 2021-09-17: 80 mg via INTRAVENOUS

## 2021-09-17 MED ORDER — ACETAMINOPHEN 10 MG/ML IV SOLN
INTRAVENOUS | Status: AC
  Filled 2021-09-17: qty 100

## 2021-09-17 MED ORDER — VANCOMYCIN HCL 500 MG IV SOLR
INTRAVENOUS | Status: DC | PRN
  Administered 2021-09-17: 500 mg via TOPICAL

## 2021-09-17 MED ORDER — CHLORHEXIDINE GLUCONATE 0.12 % MT SOLN
15.0000 mL | Freq: Once | OROMUCOSAL | Status: AC
  Filled 2021-09-17: qty 15

## 2021-09-17 MED ORDER — PHENYLEPHRINE HCL (PRESSORS) 10 MG/ML IV SOLN
INTRAVENOUS | Status: DC | PRN
  Administered 2021-09-17: 120 ug via INTRAVENOUS
  Administered 2021-09-17: 80 ug via INTRAVENOUS

## 2021-09-17 MED ORDER — CLINDAMYCIN PHOSPHATE 900 MG/50ML IV SOLN
INTRAVENOUS | Status: DC | PRN
  Administered 2021-09-17: 900 mg via INTRAVENOUS

## 2021-09-17 MED ORDER — PROPOFOL 10 MG/ML IV BOLUS
INTRAVENOUS | Status: AC
  Filled 2021-09-17: qty 20

## 2021-09-17 MED ORDER — CARVEDILOL 12.5 MG PO TABS
12.5000 mg | ORAL_TABLET | Freq: Two times a day (BID) | ORAL | Status: DC
  Administered 2021-09-17 – 2021-09-20 (×5): 12.5 mg via ORAL
  Filled 2021-09-17 (×6): qty 1

## 2021-09-17 MED ORDER — CLINDAMYCIN PHOSPHATE 900 MG/50ML IV SOLN
900.0000 mg | Freq: Three times a day (TID) | INTRAVENOUS | Status: AC
  Administered 2021-09-18 (×2): 900 mg via INTRAVENOUS
  Filled 2021-09-17 (×2): qty 50

## 2021-09-17 MED ORDER — ACETAMINOPHEN 10 MG/ML IV SOLN
1000.0000 mg | Freq: Once | INTRAVENOUS | Status: DC | PRN
  Administered 2021-09-17: 1000 mg via INTRAVENOUS

## 2021-09-17 MED ORDER — ACETAMINOPHEN 500 MG PO TABS
1000.0000 mg | ORAL_TABLET | Freq: Once | ORAL | Status: AC
  Administered 2021-09-17: 1000 mg via ORAL
  Filled 2021-09-17: qty 2

## 2021-09-17 MED ORDER — CLINDAMYCIN PHOSPHATE 900 MG/50ML IV SOLN
INTRAVENOUS | Status: AC
  Filled 2021-09-17: qty 50

## 2021-09-17 MED ORDER — ASPIRIN EC 81 MG PO TBEC
81.0000 mg | DELAYED_RELEASE_TABLET | Freq: Every day | ORAL | Status: DC
  Administered 2021-09-18 – 2021-09-22 (×5): 81 mg via ORAL
  Filled 2021-09-17 (×5): qty 1

## 2021-09-17 MED ORDER — BUPIVACAINE HCL (PF) 0.25 % IJ SOLN
INTRAMUSCULAR | Status: AC
  Filled 2021-09-17: qty 30

## 2021-09-17 MED ORDER — EPHEDRINE SULFATE 50 MG/ML IJ SOLN
INTRAMUSCULAR | Status: DC | PRN
  Administered 2021-09-17 (×2): 10 mg via INTRAVENOUS

## 2021-09-17 MED ORDER — CHLORHEXIDINE GLUCONATE 0.12 % MT SOLN
OROMUCOSAL | Status: AC
  Filled 2021-09-17: qty 15

## 2021-09-17 MED ORDER — LACTATED RINGERS IV SOLN: INTRAVENOUS | Status: DC

## 2021-09-17 SURGICAL SUPPLY — 53 items
ADH SKN CLS APL DERMABOND .7 (GAUZE/BANDAGES/DRESSINGS) ×1
APL PRP STRL LF DISP 70% ISPRP (MISCELLANEOUS) ×1
BAG COUNTER SPONGE SURGICOUNT (BAG) ×2 IMPLANT
BAG SPNG CNTER NS LX DISP (BAG) ×1
BIT DRILL AO GAMMA 4.2X180 (BIT) ×1 IMPLANT
CHLORAPREP W/TINT 26 (MISCELLANEOUS) ×1 IMPLANT
CLSR STERI-STRIP ANTIMIC 1/2X4 (GAUZE/BANDAGES/DRESSINGS) ×2 IMPLANT
COVER MAYO STAND STRL (DRAPES) ×3 IMPLANT
COVER PERINEAL POST (MISCELLANEOUS) ×2 IMPLANT
COVER SURGICAL LIGHT HANDLE (MISCELLANEOUS) ×2 IMPLANT
DERMABOND ADVANCED (GAUZE/BANDAGES/DRESSINGS) ×1
DERMABOND ADVANCED .7 DNX12 (GAUZE/BANDAGES/DRESSINGS) IMPLANT
DRAPE STERI IOBAN 125X83 (DRAPES) ×2 IMPLANT
DRAPE U-SHAPE 47X51 STRL (DRAPES) ×2 IMPLANT
DRSG MEPILEX BORDER 4X4 (GAUZE/BANDAGES/DRESSINGS) ×3 IMPLANT
DRSG TEGADERM 4X4.75 (GAUZE/BANDAGES/DRESSINGS) ×3 IMPLANT
DURAPREP 26ML APPLICATOR (WOUND CARE) ×1 IMPLANT
ELECT REM PT RETURN 9FT ADLT (ELECTROSURGICAL) ×2
ELECTRODE REM PT RTRN 9FT ADLT (ELECTROSURGICAL) ×1 IMPLANT
GAUZE SPONGE 4X4 12PLY STRL (GAUZE/BANDAGES/DRESSINGS) ×1 IMPLANT
GLOVE SRG 8 PF TXTR STRL LF DI (GLOVE) ×1 IMPLANT
GLOVE SURG ENC MOIS LTX SZ7.5 (GLOVE) ×1 IMPLANT
GLOVE SURG SYN 7.5  E (GLOVE)
GLOVE SURG SYN 7.5 E (GLOVE) IMPLANT
GLOVE SURG SYN 7.5 PF PI (GLOVE) ×1 IMPLANT
GLOVE SURG UNDER POLY LF SZ7.5 (GLOVE) ×1 IMPLANT
GLOVE SURG UNDER POLY LF SZ8 (GLOVE) ×2
GOWN STRL REUS W/ TWL LRG LVL3 (GOWN DISPOSABLE) ×2 IMPLANT
GOWN STRL REUS W/ TWL XL LVL3 (GOWN DISPOSABLE) ×2 IMPLANT
GOWN STRL REUS W/TWL LRG LVL3 (GOWN DISPOSABLE)
GOWN STRL REUS W/TWL XL LVL3 (GOWN DISPOSABLE) ×6
GUIDEROD T2 3X1000 (ROD) ×1 IMPLANT
K-WIRE  3.2X450M STR (WIRE) ×2
K-WIRE 3.2X450M STR (WIRE) ×1
KIT BASIN OR (CUSTOM PROCEDURE TRAY) ×2 IMPLANT
KIT NAIL LONG 10X360MMX125 (Nail) ×1 IMPLANT
KIT TURNOVER KIT B (KITS) ×2 IMPLANT
KWIRE 3.2X450M STR (WIRE) IMPLANT
MANIFOLD NEPTUNE II (INSTRUMENTS) ×1 IMPLANT
NS IRRIG 1000ML POUR BTL (IV SOLUTION) ×2 IMPLANT
PACK GENERAL/GYN (CUSTOM PROCEDURE TRAY) ×2 IMPLANT
PAD ARMBOARD 7.5X6 YLW CONV (MISCELLANEOUS) ×4 IMPLANT
REAMER SHAFT BIXCUT (INSTRUMENTS) ×1 IMPLANT
SCREW LAG GAMMA 3 TI 10.5X100M (Screw) ×1 IMPLANT
SCREW LOCKING T2 F/T  5X42.5MM (Screw) ×2 IMPLANT
SCREW LOCKING T2 F/T 5X42.5MM (Screw) IMPLANT
STRIP CLOSURE SKIN 1/2X4 (GAUZE/BANDAGES/DRESSINGS) ×1 IMPLANT
SUT MNCRL AB 4-0 PS2 18 (SUTURE) ×2 IMPLANT
SUT VIC AB 2-0 CT1 27 (SUTURE) ×2
SUT VIC AB 2-0 CT1 TAPERPNT 27 (SUTURE) ×1 IMPLANT
TOWEL GREEN STERILE (TOWEL DISPOSABLE) ×2 IMPLANT
TOWEL OR NON WOVEN STRL DISP B (DISPOSABLE) ×1 IMPLANT
WATER STERILE IRR 1000ML POUR (IV SOLUTION) ×1 IMPLANT

## 2021-09-17 NOTE — Progress Notes (Signed)
° ° ° °  Subjective:  Patient reports pain as well controlled this morning. Localizes pain to the left thigh   Objective:   VITALS:   Vitals:   09/16/21 2019 09/16/21 2308 09/17/21 0508 09/17/21 0531  BP: (!) 177/58 (!) 159/57 (!) 186/86 (!) 162/58  Pulse: 66 68 70 66  Resp: 18 20 17    Temp: 98 F (36.7 C) 98.2 F (36.8 C) 98.4 F (36.9 C)   TempSrc:  Oral Oral   SpO2: 100% 100% 99%   Weight:      Height:       Sensation intact distally Intact pulses distally Dorsiflexion/Plantar flexion intact Compartment soft    Lab Results  Component Value Date   WBC 8.5 09/17/2021   HGB 11.3 (L) 09/17/2021   HCT 34.0 (L) 09/17/2021   MCV 93.7 09/17/2021   PLT 247 09/17/2021   BMET    Component Value Date/Time   NA 136 09/17/2021 0032   NA 139 11/24/2012 0716   K 5.3 (H) 09/17/2021 0032   K 4.4 11/24/2012 0716   CL 105 09/17/2021 0032   CL 107 11/24/2012 0716   CO2 24 09/17/2021 0032   CO2 29 11/24/2012 0716   GLUCOSE 255 (H) 09/17/2021 0032   GLUCOSE 180 (H) 11/24/2012 0716   BUN 27 (H) 09/17/2021 0032   BUN 20 (H) 11/24/2012 0716   CREATININE 1.45 (H) 09/17/2021 0032   CREATININE 1.27 11/24/2012 0716   CALCIUM 8.5 (L) 09/17/2021 0032   CALCIUM 8.4 (L) 11/24/2012 0716   GFRNONAA 50 (L) 09/17/2021 0032   GFRNONAA 58 (L) 11/24/2012 0716     Assessment/Plan: Day of Surgery   Principal Problem:   Closed left hip fracture, initial encounter ([place]) Left unstable intertrochanteric femur fracture  No OR time available yesterday evening given multiple emergent cases. Will plan to proceed to the OR later this afternoon for ORIF with cephalomedullary nail. OR plan, recovery, risks and benefits discussed with the patient today. He has been NPO since midnight. Medically optimized by the family medicine team.   Nayara Taplin [place] 09/17/2021, 7:15 AM   Charlies Constable, MD  Contact information:   [phone] 7am-5pm epic message Dr. Zachery Dakins, or call office for patient  follow up: [phone] After hours and holidays please check [email or web address] for group call information for [facility]

## 2021-09-17 NOTE — Anesthesia Postprocedure Evaluation (Signed)
Anesthesia Post Note  Patient: Patrick Turner  Procedure(s) Performed: INTRAMEDULLARY (IM) NAIL INTERTROCHANTRIC (Left: Hip)     Patient location during evaluation: PACU Anesthesia Type: General Level of consciousness: awake and alert Pain management: pain level controlled Vital Signs Assessment: post-procedure vital signs reviewed and stable Respiratory status: spontaneous breathing, nonlabored ventilation and respiratory function stable Cardiovascular status: blood pressure returned to baseline and stable Postop Assessment: no apparent nausea or vomiting Anesthetic complications: no   No notable events documented.  Last Vitals:  Vitals:   09/17/21 2030 09/17/21 2057  BP: (!) 132/50 118/80  Pulse: (!) 57 63  Resp: 13 17  Temp:    SpO2: (!) 89% 93%    Last Pain:  Vitals:   09/17/21 2103  TempSrc:   PainSc: 0-No pain                 Keren Alverio,W. EDMOND

## 2021-09-17 NOTE — Progress Notes (Signed)
Family Medicine Teaching Service Daily Progress Note Intern Pager: [phone]  Patient name: Patrick Turner Medical record number: [id number] Date of birth: Dec 25, 1945 Age: 76 y.o. Gender: male  Primary Care Provider: Kirk Ruths, MD Consultants: Orthopedic surgery Code Status: Full  Pt Overview and Major Events to Date:  1/6 admitted 1/7 plan for orthopedic surgery for left hip fracture  Assessment and Plan:  Patrick Turner is a 76 year old male presenting with left hip fracture and fall.  Past medical history is hypertension, HLD, GERD, CKD, T2DM, OSA, hyperkalemia, frequent falls.  Fall   presyncope Heart rates in 60s to 70s. Orthostatic hypotension possibility given patient on multiple antihypertensives vs cardiac related. Has very wide pulse pressure. Drug-induced parkinsonism could have affected gait as well. Troponins negative. Hypocalcemia, likely osteoporosis possibility as well.  -F/u Echo -Cardiac monitoring -Orthostatic Vitals when stable after surgery  Left hip fracture Orthopedics plan for surgery this morning. Was in pain this morning, spoke to nurse to administer dilaudid.  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  Alert and oriented on exam but in pain. -Monitor neurologic status -Avoid antipsychotics  T2DM A1c 7.6 today.  No medications of metformin 100 mg twice daily and insulin Levemir 15 units -mSSI -Consider SGLT2 at discharge  HTN + Wide Pulse Pressure Elevated blood pressures continue likely component of pain as well.  102H to 852D systolic over 78E to 42P diastolic last night. -Increase home medication of carvedilol to 12.5 mg twice daily -D/c losartan (hyperkalemia) and metoprolol (second beta blocker) -monitor BP with these adjustments -Consider  adjustment before discharge given possibility of orthostasis -Orthostatic vitals once stable  Hyperkalemia S/p Lokelma 10 mg yesterday.  5.3 today. -Monitor on p.m. BMP -D/c losartan  CKD 3a Creatinine of 1.45 from 1.82 yesterday around baseline -Avoid nephrotoxic agents -BMP  Dysuria Ongoing for 2 months  -UA and urine culture ordered but not collected  Left leg rash Circular rash on left leg differential includes possible nummular eczema, psoriasis or fungal etiology -Monitor -outpatient w/u  Stable/chronic conditions Depression-paxil 40 mg daily GERD-protonix CAD with stable angina- ranexa (no plavix, pt not taking and surgery) History of SMA stenosis/chronic mesenteric ischemia- Hgb stable OSA-no CPAP HLD-Atorvastatin 80 mg daily  FEN/GI: NPO sips with meds PPx: heparin Dispo:pending PT and surgery  Subjective:  Patient was in pain this morning from hip  Objective: Temp:  [96.7 F (35.9 C)-98.4 F (36.9 C)] 98.4 F (36.9 C) (01/06 0508) Pulse Rate:  [64-70] 66 (01/06 0531) Resp:  [14-20] 17 (01/06 0508) BP: (143-212)/(48-86) 162/58 (01/06 0531) SpO2:  [82 %-100 %] 99 % (01/06 0508) Weight:  [83.5 kg] 83.5 kg (01/05 1640) Physical Exam: General: Distressed due to pain Cardiovascular: RRR no m/r/g Respiratory: CTAB Abdomen: Nondistended, nontender to palpation Extremities: Left extrenally rotated hip, shortened limb  Laboratory: Recent Labs  Lab 09/16/21 1440 09/17/21 0032  WBC 7.0 8.5  HGB 11.1* 11.3*  HCT 34.6* 34.0*  PLT 233 247   Recent Labs  Lab 09/16/21 1440 09/17/21 0032  NA 137 136  K 5.5* 5.3*  CL 105 105  CO2 26 24  BUN 34* 27*  CREATININE 1.82* 1.45*  CALCIUM 8.5* 8.5*  GLUCOSE 209* 255*      Imaging/Diagnostic Tests:   Gerrit Heck, MD 09/17/2021, 5:48 AM PGY-1, [place] Intern  pager: [phone], text pages welcome

## 2021-09-17 NOTE — Anesthesia Procedure Notes (Signed)
Procedure Name: Intubation Date/Time: 09/17/2021 5:23 PM Performed by: Clearnce Sorrel, CRNA Pre-anesthesia Checklist: Patient identified, Emergency Drugs available, Suction available and Patient being monitored Patient Re-evaluated:Patient Re-evaluated prior to induction Oxygen Delivery Method: Circle System Utilized Preoxygenation: Pre-oxygenation with 100% oxygen Induction Type: IV induction Ventilation: Mask ventilation without difficulty Laryngoscope Size: Glidescope and 4 Grade View: Grade I Tube type: Oral Tube size: 7.5 mm Number of attempts: 1 Airway Equipment and Method: Stylet and Oral airway Placement Confirmation: ETT inserted through vocal cords under direct vision, positive ETCO2 and breath sounds checked- equal and bilateral Secured at: 23 cm Tube secured with: Tape Dental Injury: Teeth and Oropharynx as per pre-operative assessment

## 2021-09-17 NOTE — Anesthesia Procedure Notes (Signed)
Anesthesia Regional Block: Femoral nerve block   Pre-Anesthetic Checklist: , timeout performed,  Correct Patient, Correct Site, Correct Laterality,  Correct Procedure, Correct Position, site marked,  Risks and benefits discussed,  Surgical consent,  Pre-op evaluation,  At surgeon's request and post-op pain management  Laterality: Left  Prep: chloraprep       Needles:  Injection technique: Single-shot  Needle Type: Echogenic Needle     Needle Length: 9cm      Additional Needles:   Procedures:,,,, ultrasound used (permanent image in chart),,    Narrative:  Start time: 09/17/2021 4:50 PM End time: 09/17/2021 4:57 PM Injection made incrementally with aspirations every 5 mL.  Performed by: Personally  Anesthesiologist: Myrtie Soman, MD  Additional Notes: Patient tolerated the procedure well without complications

## 2021-09-17 NOTE — Transfer of Care (Signed)
Immediate Anesthesia Transfer of Care Note  Patient: Patrick Turner  Procedure(s) Performed: INTRAMEDULLARY (IM) NAIL INTERTROCHANTRIC (Left: Hip)  Patient Location: PACU  Anesthesia Type:GA combined with regional for post-op pain  Level of Consciousness: drowsy  Airway & Oxygen Therapy: Patient Spontanous Breathing and Patient connected to nasal cannula oxygen  Post-op Assessment: Report given to RN and Post -op Vital signs reviewed and stable  Post vital signs: Reviewed and stable  Last Vitals:  Vitals Value Taken Time  BP 156/88 09/17/21 1934  Temp    Pulse 66 09/17/21 1933  Resp 15 09/17/21 1934  SpO2 99 % 09/17/21 1933  Vitals shown include unvalidated device data.  Last Pain:  Vitals:   09/17/21 1655  TempSrc:   PainSc: 0-No pain      Patients Stated Pain Goal: 0 (07/46/00 2984)  Complications: No notable events documented.

## 2021-09-17 NOTE — Anesthesia Procedure Notes (Signed)
Anesthesia Procedure Image    

## 2021-09-17 NOTE — Progress Notes (Signed)
Inpatient Diabetes Program Recommendations  AACE/ADA: New Consensus Statement on Inpatient Glycemic Control (2015)  Target Ranges:  Prepandial:   less than 140 mg/dL      Peak postprandial:   less than 180 mg/dL (1-2 hours)      Critically ill patients:  140 - 180 mg/dL   Lab Results  Component Value Date   GLUCAP 269 (H) 09/17/2021   HGBA1C 7.6 (H) 09/17/2021    Review of Glycemic Control  Latest Reference Range & Units 09/16/21 16:08 09/16/21 20:32 09/17/21 07:44  Glucose-Capillary 70 - 99 mg/dL 252 (H) 264 (H) 269 (H)  (H): Data is abnormally high Diabetes history: Type 2 DM Outpatient Diabetes medications: Levemir 15 units QHS, Actos 15 mg QD, Ozempic 1 mg qwk, Metformin 500 mg BID Current orders for Inpatient glycemic control: Novolog 0-15 units TID  Inpatient Diabetes Program Recommendations:     Consider adding Levemir 10 units QHS.   Thanks, Bronson Curb, MSN, RNC-OB Diabetes Coordinator [phone] (8a-5p)

## 2021-09-17 NOTE — Consult Note (Signed)
° °  [facility] Inpatient Consult   09/17/2021  HARRIET BOLLEN [birth year] [id number]  [place] Organization [ACO] Patient: [place] Medicare   Primary Care Provider:  Kirk Ruths, MD, [address]  Patient is currently active with [facility] for chronic disease management services.  Patient has been engaged by a Telephonic [facility] RN CM.  Our community based plan of care has focused on disease management and community resource support.  Current disposition is unknown.  Plan: Will follow up for Transition Of Care [TOC]  Of note, [facility] Care Management services does not replace or interfere with any services that are needed or arranged by inpatient [facility] care management team.  For additional questions or referrals please contact:  Natividad Brood, RN BSN [facility] Liaison  [phone] business mobile phone Toll free office [phone]  Fax number: [phone] [email or web address]

## 2021-09-17 NOTE — Op Note (Signed)
DATE OF SURGERY:  09/17/2021  TIME: 7:11 PM  PATIENT NAME:  Patrick Turner  AGE: 76 y.o.  PRE-OPERATIVE DIAGNOSIS: Left intertrochanteric femur fracture  POST-OPERATIVE DIAGNOSIS:  SAME  PROCEDURE: ORIF left intertrochanteric femur fracture with septal medullary nail  SURGEON:  Lael Pilch A Ethen Bannan  ASSISTANT: none  OPERATIVE IMPLANTS:  Implant Name Type Inv. Item Serial No. Manufacturer Lot No. LRB No. Used Action  KIT NAIL LONG [id number] - [id number] Nail KIT NAIL LONG [id number]  [employer] [id number] Left 1 Implanted  SCREW LAG GAMMA 3 TI 10.5X100M - [id number] Screw SCREW LAG GAMMA 3 TI 10.5X100M  [employer] [id number] Left 1 Implanted  SCREW LOCKING T2 F/T  5X42.5MM - [id number] Screw SCREW LOCKING T2 F/T  5X42.5MM  [employer] [id number] Left 1 Implanted    ESTIMATED BLOOD LOSS: 200cc  PREOPERATIVE INDICATIONS:  Patrick Turner is a 76 y.o. year old who fell and suffered a hip fracture. He was brought into the ER and then admitted and optimized and then elected for surgical intervention.    The risks benefits and alternatives were discussed with the patient including but not limited to the risks of nonoperative treatment, versus surgical intervention including infection, bleeding, nerve injury, malunion, nonunion, hardware prominence, hardware failure, need for hardware removal, blood clots, cardiopulmonary complications, morbidity, mortality, among others, and they were willing to proceed.    OPERATIVE PROCEDURE:  The patient was brought to the operating room and placed in the supine position. Anesthesia was administered. He was placed on the fracture table.  Closed reduction was performed under C-arm guidance.  Time out was then performed after sterile prep and drape. He received preoperative antibiotics.  Incision was made proximal to the greater trochanter through the abductor fascia.  A guidepin was placed in the appropriate position just medial to  the tip of the greater troches and centrally on the lateral.  This was advanced on power.  An entry reamer was used to open up the proximal femur.  A guidewire was placed in the appropriate position. Confirmation was made on AP and lateral views.  The length of the guidewire was measured to determine the appropriate length of the nail.  We then sequentially reamed from 9 mm up to 12 mm.  A 10 mm x 360 mm nail was selected.  The above-named nail was opened. I then placed the nail by hand down.  Once the nail was completely seated, I placed a guidepin into the femoral head into the center center position. I measured the length, and then reamed the lateral cortex and up into the head. I then placed the cephalomedullary screw. Slight compression was applied. Anatomic fixation achieved.   I then secured the proximal interlocking bolt.  Distal interlock screw was placed using perfect circle technique.  I took final C-arm pictures AP and lateral.   Anatomic reconstruction was achieved, and the wounds were irrigated copiously, and 500 mg of vancomycin powder was placed into the wounds.  The abductor fascia was closed with 0 Vicryl.  Skin was closed with 2-0 Vicryl and 4-0 Monocryl.  Skin was sealed with Dermabond and dressed with 4 x 4 gauze and Tegaderm dressing.  The patient was awakened and returned to PACU in stable and satisfactory condition. There were no complications and the patient tolerated the procedure well.  Post op recs: WB: WBAT LLE Abx: Clindamycin x23 hours Imaging: PACU xrays Dressing: keep intact until follow up, change PRN if soiled or saturated. DVT prophylaxis: start aspirin  POD1 43m, then resume plavix POD2 Follow up: 2 weeks after surgery for a wound check [facility]  [place] [address] [place] [address] Office Phone: [phone]   DCharlies Constable M.D.

## 2021-09-17 NOTE — H&P (View-Only) (Signed)
° ° ° °  Subjective:  Patient reports pain as well controlled this morning. Localizes pain to the left thigh   Objective:   VITALS:   Vitals:   09/16/21 2019 09/16/21 2308 09/17/21 0508 09/17/21 0531  BP: (!) 177/58 (!) 159/57 (!) 186/86 (!) 162/58  Pulse: 66 68 70 66  Resp: 18 20 17    Temp: 98 F (36.7 C) 98.2 F (36.8 C) 98.4 F (36.9 C)   TempSrc:  Oral Oral   SpO2: 100% 100% 99%   Weight:      Height:       Sensation intact distally Intact pulses distally Dorsiflexion/Plantar flexion intact Compartment soft    Lab Results  Component Value Date   WBC 8.5 09/17/2021   HGB 11.3 (L) 09/17/2021   HCT 34.0 (L) 09/17/2021   MCV 93.7 09/17/2021   PLT 247 09/17/2021   BMET    Component Value Date/Time   NA 136 09/17/2021 0032   NA 139 11/24/2012 0716   K 5.3 (H) 09/17/2021 0032   K 4.4 11/24/2012 0716   CL 105 09/17/2021 0032   CL 107 11/24/2012 0716   CO2 24 09/17/2021 0032   CO2 29 11/24/2012 0716   GLUCOSE 255 (H) 09/17/2021 0032   GLUCOSE 180 (H) 11/24/2012 0716   BUN 27 (H) 09/17/2021 0032   BUN 20 (H) 11/24/2012 0716   CREATININE 1.45 (H) 09/17/2021 0032   CREATININE 1.27 11/24/2012 0716   CALCIUM 8.5 (L) 09/17/2021 0032   CALCIUM 8.4 (L) 11/24/2012 0716   GFRNONAA 50 (L) 09/17/2021 0032   GFRNONAA 58 (L) 11/24/2012 0716     Assessment/Plan: Day of Surgery   Principal Problem:   Closed left hip fracture, initial encounter ([place]) Left unstable intertrochanteric femur fracture  No OR time available yesterday evening given multiple emergent cases. Will plan to proceed to the OR later this afternoon for ORIF with cephalomedullary nail. OR plan, recovery, risks and benefits discussed with the patient today. He has been NPO since midnight. Medically optimized by the family medicine team.   Shireen Rayburn A Lamari Beckles 09/17/2021, 7:15 AM   Charlies Constable, MD  Contact information:   [phone] 7am-5pm epic message Dr. Zachery Dakins, or call office for patient  follow up: [phone] After hours and holidays please check [email or web address] for group call information for [facility]

## 2021-09-17 NOTE — Interval H&P Note (Signed)
The patient has been re-examined, and the chart reviewed, and there have been no interval changes to the documented history and physical.    The risks benefits and alternatives were discussed with the patient including but not limited to the risks of nonoperative treatment, versus surgical intervention including infection, bleeding, nerve injury, malunion, nonunion, the need for revision surgery, hardware prominence, hardware failure, the need for hardware removal, blood clots, cardiopulmonary complications, morbidity, mortality, among others, and they were willing to proceed.  Consent was signed by myself and the patient.  Left hip was marked.   Charlies Constable, MD Orthopaedic Surgery

## 2021-09-17 NOTE — Anesthesia Preprocedure Evaluation (Signed)
Anesthesia Evaluation  Patient identified by MRN, date of birth, ID band Patient awake    Reviewed: Allergy & Precautions, NPO status , Patient's Chart, lab work & pertinent test results  Airway Mallampati: II  TM Distance: >3 FB Neck ROM: Full    Dental no notable dental hx.    Pulmonary sleep apnea ,  Pulmonary HTN   Pulmonary exam normal breath sounds clear to auscultation       Cardiovascular hypertension, + CAD and + Peripheral Vascular Disease  Normal cardiovascular exam+ Valvular Problems/Murmurs MR and AI  Rhythm:Regular Rate:Normal  /04/2020 demonstrated "EF 28-78%, grade 1 diastolic dysfunction, severe LA dilation, RV systolic dysfunction, moderate AR, moderate TR with moderate pulmonary HTN and mild-moderate MR   Neuro/Psych negative neurological ROS  negative psych ROS   GI/Hepatic Neg liver ROS, GERD  ,  Endo/Other  diabetes  Renal/GU Renal InsufficiencyRenal disease  negative genitourinary   Musculoskeletal negative musculoskeletal ROS (+)   Abdominal   Peds negative pediatric ROS (+)  Hematology  (+) anemia ,   Anesthesia Other Findings   Reproductive/Obstetrics negative OB ROS                             Anesthesia Physical Anesthesia Plan  ASA: 4  Anesthesia Plan: General   Post-op Pain Management: Dilaudid IV   Induction: Intravenous  PONV Risk Score and Plan: 2 and Ondansetron, Dexamethasone and Treatment may vary due to age or medical condition  Airway Management Planned: Oral ETT  Additional Equipment:   Intra-op Plan:   Post-operative Plan: Extubation in OR  Informed Consent: I have reviewed the patients History and Physical, chart, labs and discussed the procedure including the risks, benefits and alternatives for the proposed anesthesia with the patient or authorized representative who has indicated his/her understanding and acceptance.     Dental  advisory given  Plan Discussed with: CRNA and Surgeon  Anesthesia Plan Comments:         Anesthesia Quick Evaluation

## 2021-09-17 NOTE — Progress Notes (Signed)
Pacu RN Report to floor given  Gave report to  RN. Room: Discussed surgery, meds given in OR and Pacu, VS, IV fluids given, EBL, urine output, pain and other pertinent information. Also discussed if pt had any family or friends here or belongings with them.   Discussed pt is very forgetful, AXO to self, tried calling wife/son to see his baseline. He was confused as well before surgery. Frequent checks will be necessary.   Pt exits my care.

## 2021-09-17 NOTE — TOC CAGE-AID Note (Signed)
Transition of Care [facility]) - CAGE-AID Screening   Patient Details  Name: Patrick Turner MRN: [id number] Date of Birth: 09/28/1945  Transition of [facility]) CM/SW Contact:    Lexxus Underhill C Tarpley-Carter, LCSWA Phone Number: 09/17/2021, 8:13 AM   Clinical Narrative: Pt participated in [place].  Pt stated he does not use substance or ETOH.  Pt was not offered resources, due to no usage of substance or ETOH.    Addylynn Balin Tarpley-Carter, MSW, LCSW-A Pronouns:  She/Her/Hers [place] Transitions of Care Clinical Social Worker Direct Number:  [phone] [email or web address]  CAGE-AID Screening:    Have You Ever Felt You Ought to Cut Down on Your Drinking or Drug Use?: No Have People Annoyed You By [employer] Your Drinking Or Drug Use?: No Have You Felt Bad Or Guilty About Your Drinking Or Drug Use?: No Have You Ever Had a Drink or Used Drugs First Thing In The Morning to Steady Your Nerves or to Get Rid of a Hangover?: No CAGE-AID Score: 0  Substance Abuse Education Offered: No

## 2021-09-18 DIAGNOSIS — N1832 Chronic kidney disease, stage 3b: Secondary | ICD-10-CM | POA: Diagnosis not present

## 2021-09-18 DIAGNOSIS — R410 Disorientation, unspecified: Secondary | ICD-10-CM

## 2021-09-18 DIAGNOSIS — S72002A Fracture of unspecified part of neck of left femur, initial encounter for closed fracture: Secondary | ICD-10-CM

## 2021-09-18 LAB — CBC
HCT: 32.7 % — ABNORMAL LOW (ref 39.0–52.0)
Hemoglobin: 10.8 g/dL — ABNORMAL LOW (ref 13.0–17.0)
MCH: 31.2 pg (ref 26.0–34.0)
MCHC: 33 g/dL (ref 30.0–36.0)
MCV: 94.5 fL (ref 80.0–100.0)
Platelets: 199 10*3/uL (ref 150–400)
RBC: 3.46 MIL/uL — ABNORMAL LOW (ref 4.22–5.81)
RDW: 15.5 % (ref 11.5–15.5)
WBC: 10.9 10*3/uL — ABNORMAL HIGH (ref 4.0–10.5)
nRBC: 0 % (ref 0.0–0.2)

## 2021-09-18 LAB — BASIC METABOLIC PANEL
Anion gap: 10 (ref 5–15)
BUN: 22 mg/dL (ref 8–23)
CO2: 21 mmol/L — ABNORMAL LOW (ref 22–32)
Calcium: 8.6 mg/dL — ABNORMAL LOW (ref 8.9–10.3)
Chloride: 104 mmol/L (ref 98–111)
Creatinine, Ser: 1.43 mg/dL — ABNORMAL HIGH (ref 0.61–1.24)
GFR, Estimated: 51 mL/min — ABNORMAL LOW (ref >60)
Glucose, Bld: 251 mg/dL — ABNORMAL HIGH (ref 70–99)
Potassium: 5.1 mmol/L (ref 3.5–5.1)
Sodium: 135 mmol/L (ref 135–145)

## 2021-09-18 LAB — GLUCOSE, CAPILLARY
Glucose-Capillary: 268 mg/dL — ABNORMAL HIGH (ref 70–99)
Glucose-Capillary: 217 mg/dL — ABNORMAL HIGH (ref 70–99)
Glucose-Capillary: 230 mg/dL — ABNORMAL HIGH (ref 70–99)
Glucose-Capillary: 219 mg/dL — ABNORMAL HIGH (ref 70–99)

## 2021-09-18 LAB — URINE CULTURE: Culture: <10000 — AB

## 2021-09-18 MED ORDER — SODIUM CHLORIDE 0.9 % IV SOLN: INTRAVENOUS | Status: DC

## 2021-09-18 MED ORDER — ACETAMINOPHEN 325 MG PO TABS
650.0000 mg | ORAL_TABLET | Freq: Four times a day (QID) | ORAL | Status: DC | PRN
  Administered 2021-09-18: 650 mg via ORAL
  Filled 2021-09-18: qty 2

## 2021-09-18 MED ORDER — ACETAMINOPHEN 325 MG PO TABS
650.0000 mg | ORAL_TABLET | Freq: Four times a day (QID) | ORAL | Status: DC
  Administered 2021-09-18 – 2021-09-22 (×14): 650 mg via ORAL
  Filled 2021-09-18 (×15): qty 2

## 2021-09-18 MED ORDER — ENOXAPARIN SODIUM 30 MG/0.3ML IJ SOSY
30.0000 mg | PREFILLED_SYRINGE | Freq: Two times a day (BID) | INTRAMUSCULAR | Status: DC
  Administered 2021-09-18 – 2021-09-22 (×9): 30 mg via SUBCUTANEOUS
  Filled 2021-09-18 (×9): qty 0.3

## 2021-09-18 MED ORDER — HYDROMORPHONE HCL 1 MG/ML IJ SOLN
0.2500 mg | INTRAMUSCULAR | Status: DC | PRN
  Administered 2021-09-18 – 2021-09-19 (×3): 0.25 mg via INTRAVENOUS
  Filled 2021-09-18 (×3): qty 0.5

## 2021-09-18 MED ORDER — OXYCODONE HCL 5 MG PO TABS
5.0000 mg | ORAL_TABLET | ORAL | Status: DC | PRN
  Administered 2021-09-18: 5 mg via ORAL
  Filled 2021-09-18 (×2): qty 1

## 2021-09-18 NOTE — Progress Notes (Addendum)
FPTS Brief Progress Note  S: Patient was laying in bed asleep but woke up upon arrival into the room.  Appears confused and only oriented to person.  Patient does not recall having surgery today.  Denies any pain.  Nurse acknowledged patient's confusion.  O: BP 118/80 (BP Location: Left Arm)    Pulse 63    Temp 98.2 F (36.8 C)    Resp 17    Ht 5\' 5"  (1.651 m)    Wt 83.5 kg    SpO2 93%    BMI 30.62 kg/m    General:Awake, well appearing, NAD HEENT: Atraumatic, MMM, pupils reactive to light CV: RRR, no murmurs, normal S1/S2 Pulm: CTAB, good WOB on RA, no crackles or wheezing Abd: Soft, no distension, no tenderness Skin: dry, warm Ext: No BLE edema, +2 Pedal and radial pulse.  Neuro: Oriented x1, muscle strength is 5/5 on extremities, pupil are equal and reactive to light    A/P: Left intertrochanteric hip repair Patient is status post day #0 of left hip intra-trochanter repair. His vitals are stable and on exam doesn't appear to be in distress. Pain seem to be well controlled. -Orthopedic surgery following, appreciate recs -Dilaudid 0.2 mg every 2 hours as needed  ? Delirium On exam patient is oriented to only person.  He appears confused and per chart review patient seems to have waxing and waning of mental status since admission.  With stable vitals and no gross neurologic deficit on exam there is less concern for stroke. There is also possibility of anesthesia effect post surgery although less likely since patient has a prior history of confusion in the ED before admission. He might have poor tolerance to fentanyl as chart review showed he received fentanyl prior to his confusion in the ED and again today with surgery. Patient does not seem agitated at this time, will take conservative approach and monitor closely.  - Orders reviewed. Labs for AM ordered, which was adjusted as needed.  -Continue plan per day team.  Alen Bleacher, MD 09/18/2021, 12:04 AM PGY-1, [place] [facility]  [facility] Night Resident  Please page [phone] with questions.

## 2021-09-18 NOTE — Evaluation (Signed)
Physical Therapy Evaluation Patient Details Name: Patrick Turner MRN: [id number] DOB: [birth year] Today's Date: 09/18/2021  History of Present Illness  76 yo male with onset of fall of unclear nature was admitted 1/5 for ORIF to L intertrochanteric femur fracture.  Pt was alternately orthostatic and hypertensive upon admission, now more normotensive.  Pt is confused at baseline but very limited ability to follow instructions now.  PMHx:  DM, eczema, OSA, GERD, depression, CKD3a, HLD, SMA stenosis, CAD  Clinical Impression  Pt was seen for initial movement after L IM nailing of femoral fracture, and note his pain and reluctance to move.  Pt is also very HOH but when hearing aids were put on, could not understand or PT nor could he contribute much meaningful speech.  PT has plan to encourage OOB to chair, but also to assist nursing with helping him to make small progress efforts to get to side of bed and sit in chair posture on bed.  Pt is refusing much with food and meds, and will need to potentially have more intervention with this low intake issue.   Follow up as acute PT goals are written, encouraging LE strengthening ex, to increase standing as able and to progress to gait since per nursing the patient was previously ambulatory.  SNF recommended due to his inabiltiy to sit on side of bed and balance, inability to stand and limitations of wife being able to manage his dependent care.       Recommendations for follow up therapy are one component of a multi-disciplinary discharge planning process, led by the attending physician.  Recommendations may be updated based on patient status, additional functional criteria and insurance authorization.  Follow Up Recommendations Skilled nursing-short term rehab (<3 hours/day)    Assistance Recommended at Discharge Frequent or constant Supervision/Assistance  Patient can return home with the following  A lot of help with walking and/or transfers;Two people  to help with walking and/or transfers;Two people to help with bathing/dressing/bathroom;Assistance with cooking/housework;Assist for transportation;Help with stairs or ramp for entrance;A lot of help with bathing/dressing/bathroom    Equipment Recommendations None recommended by PT (pt cannot give equipment history)  Recommendations for Other Services       Functional Status Assessment Patient has had a recent decline in their functional status and demonstrates the ability to make significant improvements in function in a reasonable and predictable amount of time.     Precautions / Restrictions Precautions Precautions: Fall;Other (comment) (HOH) Precaution Comments: has hearing aids but do not help him understand much more Restrictions Weight Bearing Restrictions: Yes LLE Weight Bearing: Weight bearing as tolerated      Mobility  Bed Mobility Overal bed mobility: Needs Assistance Bed Mobility: Rolling;Sidelying to Sit;Sit to Sidelying Rolling: Mod assist Sidelying to sit: Max assist;+2 for physical assistance;+2 for safety/equipment     Sit to sidelying: Max assist General bed mobility comments: actively resisting movement and when nursing arrived with meds was not willing to take them, spit them out intially    Transfers Overall transfer level: Needs assistance                 General transfer comment: pt refuses to let PT stand him    Ambulation/Gait               General Gait Details: unable  Stairs            Wheelchair Mobility    Modified Rankin (Stroke Patients Only)  Balance Overall balance assessment: Needs assistance;History of Falls Sitting-balance support: Feet supported;Bilateral upper extremity supported Sitting balance-Leahy Scale: Poor Sitting balance - Comments: pt is somewhat resisting sitting up on side of bed                                     Pertinent Vitals/Pain Pain Assessment: Faces Faces Pain  Scale: Hurts even more Pain Location: L hip to move on the bed Pain Descriptors / Indicators: Grimacing;Guarding    Home Living Family/patient expects to be discharged to:: Skilled nursing facility Living Arrangements: Spouse/significant other Available Help at Discharge: Family;Available 24 hours/day Type of Home: House             Additional Comments: pt cannot give PT any history on his abiltiy to walk or layout of home prior to fall.    Prior Function Prior Level of Function : Patient poor historian/Family not available             Mobility Comments: pt is unable to give PLOF information       Hand Dominance        Extremity/Trunk Assessment   Upper Extremity Assessment Upper Extremity Assessment: Generalized weakness    Lower Extremity Assessment Lower Extremity Assessment: Generalized weakness;LLE deficits/detail LLE Deficits / Details: pt is painful and will not let PT mm test or get full ROM by flexing L knee LLE: Unable to fully assess due to pain LLE Coordination: decreased gross motor    Cervical / Trunk Assessment Cervical / Trunk Assessment: Kyphotic  Communication      Cognition Arousal/Alertness: Lethargic Behavior During Therapy: Flat affect Overall Cognitive Status: No family/caregiver present to determine baseline cognitive functioning                                 General Comments: pt has lesser level of clarity cognitively per family as told to nursing        General Comments General comments (skin integrity, edema, etc.): Pt is up to side of bed with help but resisting, has pain on L hip but cannot understand conversation nor contribute much information.  Refusing to take pills    Exercises     Assessment/Plan    PT Assessment Patient needs continued PT services  PT Problem List Decreased strength;Decreased range of motion;Decreased activity tolerance;Decreased balance;Decreased mobility;Decreased  coordination;Decreased cognition;Decreased skin integrity;Pain       PT Treatment Interventions DME instruction;Gait training;Functional mobility training;Therapeutic activities;Therapeutic exercise;Balance training;Neuromuscular re-education;Patient/family education    PT Goals (Current goals can be found in the Care Plan section)  Acute Rehab PT Goals Patient Stated Goal: none stated PT Goal Formulation: Patient unable to participate in goal setting Time For Goal Achievement: 10/02/21 Potential to Achieve Goals: Fair    Frequency Min 3X/week     Co-evaluation               AM-PAC PT "6 Clicks" Mobility  Outcome Measure Help needed turning from your back to your side while in a flat bed without using bedrails?: A Lot Help needed moving from lying on your back to sitting on the side of a flat bed without using bedrails?: A Lot Help needed moving to and from a bed to a chair (including a wheelchair)?: Total Help needed standing up from a chair using your arms (e.g., wheelchair or bedside chair)?:  Total Help needed to walk in hospital room?: Total Help needed climbing 3-5 steps with a railing? : Total 6 Click Score: 8    End of Session   Activity Tolerance: Patient limited by fatigue;Patient limited by pain Patient left: in bed;with call bell/phone within reach;with bed alarm set;with nursing/sitter in room Nurse Communication: Mobility status PT Visit Diagnosis: Muscle weakness (generalized) (M62.81);Pain;History of falling (Z91.81) Pain - Right/Left: Left Pain - part of body: Hip    Time: 3750-5107 PT Time Calculation (min) (ACUTE ONLY): 38 min   Charges:   PT Evaluation $PT Eval Moderate Complexity: 1 Mod PT Treatments $Therapeutic Activity: 8-22 mins $Neuromuscular Re-education: 8-22 mins       Ramond Dial 09/18/2021, 4:54 PM  Mee Hives, PT PhD Acute Rehab Dept. Number: [place] and [place]

## 2021-09-18 NOTE — Progress Notes (Addendum)
Family Medicine Teaching Service Daily Progress Note Intern Pager: [phone]  Patient name: Patrick Turner Medical record number: [id number] Date of birth: 04-24-1946 Age: 76 y.o. Gender: male  Primary Care Provider: Kirk Ruths, MD Consultants: Orthopedic surgery Code Status: Full  Pt Overview and Major Events to Date:  1/6 admitted 1/7  ORIF left intertrochanteric femur fracture with septal medullary nail  Assessment and Plan:  Confusion/possible delirium This morning mental status is oriented to person and time not to place or situation.  Patient did receive fentanyl yesterday with surgery and patient had issue with this on ED arrival however this was at 1650. He had also gone NPO for ~ 24 hours as original plan was for surgery on 1/6 evening and then 1/6 morning however surgery had gotten pushed back due to emergent cases. Glucose level this morning appropriate at 251. Neuro exam no sided weakness but patient kept repeating "yes, feels the same on both sides" even when not doing sensation testing. Able to follow commands for the most part except was not shrugging shoulders. Prior CT head was normal but did hit back of head during fall. Waxing and waning in nature, likely more of a delirium picture. Vitals showed normal temps, some lower pulses, saturating well on room air.  -Monitor mental status -scheduling tylenol for pain control and d/c dilaudid; prn oxy for breakthrough -1/2 mIVF since not eating or drinking much/dry mucous membranes on exam -delirium precautions  Fall   left intertrochanteric femur fracture s/p ORIF 1/6 Likely more mechanical fall given history.  Echo showed ejection fraction of 60 to 65% and grade 1 diastolic dysfunction and thickening of mitral valve leaflets. Tried moving leg and had pain but otherwise was not complaining  -PT/OT -Orthostatic vitals when pain stabilized -d/c dilaudid -Oxycodone prn for breakthrough pain -acetaminophen 650 q6h  scheduled  HTN + Widened pulse pressure, improved Did have elevated pressures to 160s-200s/50s-136. Pressure improved after surgery to 120s-130s/40s-80s. Some normotensive pressures recorded. -monitor BP -Continue holding metoprolol and losartan -Continue carvedilol at 12.5 mg BID, consider reducing if diastolics remain low  Hyperkalemia K 4.5 yesterday afternoon and 5.1 this morning.  -Monitor on BMP  T2DM 129-251, not eating. 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-consider steroid cream/outpatient w/u -CKD 3a-Cr stable at 1.43 -GERD-home protonix -Depression-paxil 40 mg daily -OSA-no CPA -HLD-atorvastatin 80 mg daily -CAD with stable angina-ranexa (holding plavix, patient does not take outpt) -Hx of SMA stenosis/Chronic mesenteric ischemia- Hgb 10.8, down from 11.3, surgery yesterday  FEN/GI: Regular Diet PPx: Lovenox added (4 weeks per orthopedics) Dispo:Pending PT recommendations/medical work up  Subjective:  Today felt "out of it" and had some trouble answering questions  Objective: Temp:  [97.9 F (36.6 C)-98.9 F (37.2 C)] 98.4 F (36.9 C) (01/07 0410) Pulse Rate:  [57-81] 58 (01/07 0410) Resp:  [13-20] 15 (01/07 0005) BP: (101-216)/(45-136) 101/45 (01/07 0410) SpO2:  [89 %-100 %] 98 % (01/07 0410) Physical Exam: General: Drowsy, confused A&O x 2 (person, year) Head: Mouth appears slightly dry Cardiovascular: RRR no m/r/g cap refill ~2-3 seconds Respiratory: CTAB no w/r/c Abdomen: Nontender to palpation, soft Extremities: No LE Edema, L hip with drain in place  Neuro: CN III, IV,VI: EOMI CV V: Possibly normal sensation in V1, V2, V3 although kept repeating feels the same CVII: Symmetric smile  CN VIII: Hard of hearing, uses aides CN IX,X: Symmetric palate raise  CN XI: unable to perform shoulder shrug CN XII: Symmetric tongue protrusion  Grip strength same bilaterally Normal sensation in UE and LE  bilaterally but unsure if reliable answers    Laboratory: Recent Labs  Lab 09/16/21 1440 09/17/21 0032 09/18/21 0044  WBC 7.0 8.5 10.9*  HGB 11.1* 11.3* 10.8*  HCT 34.6* 34.0* 32.7*  PLT 233 247 199   Recent Labs  Lab 09/17/21 0032 09/17/21 1404 09/18/21 0044  NA 136 136 135  K 5.3* 4.5 5.1  CL 105 105 104  CO2 24 22 21*  BUN 27* 21 22  CREATININE 1.45* 1.43* 1.43*  CALCIUM 8.5* 9.1 8.6*  GLUCOSE 255* 162* 251*    Imaging/Diagnostic Tests:   Gerrit Heck, MD 09/18/2021, 7:42 AM PGY-1, [place] Intern pager: [phone], text pages welcome

## 2021-09-18 NOTE — Progress Notes (Addendum)
° ° ° °  Subjective: Pain well controlled this morning. Reports some issues with pain control overnight. Denies distal n/t. Questions regarding surgery answered.  Objective:   VITALS:   Vitals:   09/17/21 2030 09/17/21 2057 09/18/21 0005 09/18/21 0410  BP: (!) 132/50 118/80 112/75 (!) 101/45  Pulse: (!) 57 63 60 (!) 58  Resp: 13 17 15    Temp:  98.2 F (36.8 C) 98 F (36.7 C) 98.4 F (36.9 C)  TempSrc:      SpO2: (!) 89% 93% 95% 98%  Weight:      Height:        Sensation intact distally Intact pulses distally Dorsiflexion/Plantar flexion intact Incision: dressing C/D/I Compartment soft   Lab Results  Component Value Date   WBC 10.9 (H) 09/18/2021   HGB 10.8 (L) 09/18/2021   HCT 32.7 (L) 09/18/2021   MCV 94.5 09/18/2021   PLT 199 09/18/2021   BMET    Component Value Date/Time   NA 135 09/18/2021 0044   NA 139 11/24/2012 0716   K 5.1 09/18/2021 0044   K 4.4 11/24/2012 0716   CL 104 09/18/2021 0044   CL 107 11/24/2012 0716   CO2 21 (L) 09/18/2021 0044   CO2 29 11/24/2012 0716   GLUCOSE 251 (H) 09/18/2021 0044   GLUCOSE 180 (H) 11/24/2012 0716   BUN 22 09/18/2021 0044   BUN 20 (H) 11/24/2012 0716   CREATININE 1.43 (H) 09/18/2021 0044   CREATININE 1.27 11/24/2012 0716   CALCIUM 8.6 (L) 09/18/2021 0044   CALCIUM 8.4 (L) 11/24/2012 0716   GFRNONAA 51 (L) 09/18/2021 0044   GFRNONAA 58 (L) 11/24/2012 0716     Assessment/Plan: 1 Day Post-Op   Principal Problem:   Closed left hip fracture, initial encounter (HCC)  Left intertrochanteric femur fx s/p ORIF 1/6  Post op recs: WB: WBAT LLE Abx: Clindamycin x23 hours Imaging: PACU xrays Dressing: keep intact until follow up, change PRN if soiled or saturated. DVT prophylaxis: lovenox x4 weeks starting POD1 (patient is no longer taking plavix at home) Follow up: 2 weeks after surgery for a wound check with Dr. Zachery Dakins at [facility].  Address: [address] [place], [place], [place] [address]   Office Phone: [phone]    Willaim Sheng 09/18/2021, 7:36 AM   Charlies Constable, MD  Contact information:   [phone] 7am-5pm epic message Dr. Zachery Dakins, or call office for patient follow up: [phone] After hours and holidays please check [email or web address] for group call information for [facility]

## 2021-09-18 NOTE — Discharge Instructions (Addendum)
Diet: As you were doing prior to hospitalization   Shower:  May shower but keep the wounds dry, use an occlusive plastic wrap, NO SOAKING IN TUB.  If the bandage gets wet, change with a clean dry gauze.    Dressing:  You may change your dressing 3-5 days after surgery, unless you have a splint.  If the dressing remains clean and dry it can also be left on until follow up. If you change the dressing replace with clean gauze and tape or ace wrap.    Activity:  Increase activity slowly as tolerated, but follow the weight bearing instructions below.  The rules on driving is that you can not be taking narcotics while you drive, and you must feel in control of the vehicle.    Weight Bearing:   Weight bearing as tolerated on your left leg with use of a walker..    Blood clot prevention (DVT Prophylaxis): After surgery you are at an increased risk for a blood clot. You are prescribed a blood thinner to take after surgery. This will help prevent a blood clot. Signs of a pulmonary embolus (blood clot in the lungs) include sudden short of breath, feeling lightheaded or dizzy, chest pain with a deep breath, rapid pulse rapid breathing. Signs of a blood clot in your arms or legs include new unexplained swelling and cramping, warm, red or darkened skin around the painful area. Please call the office or 911 right away if these signs or symptoms develop. To prevent constipation: you may use a stool softener such as -  Colace (over the counter) 100 mg by mouth twice a day  Drink plenty of fluids (prune juice may be helpful) and high fiber foods Miralax (over the counter) for constipation as needed.    Itching:  If you experience itching with your medications, try taking only a single pain pill, or even half a pain pill at a time.  You may take up to 10 pain pills per day, and you can also use benadryl over the counter for itching or also to help with sleep.   Precautions:  If you experience chest pain or  shortness of breath - call 911 immediately for transfer to the hospital emergency department!!   Call office [phone]) for the following: Temperature greater than 101F Persistent nausea and vomiting Severe uncontrolled pain Redness, tenderness, or signs of infection (pain, swelling, redness, odor or green/yellow discharge around the site) Difficulty breathing, headache or visual disturbances Hives Persistent dizziness or light-headedness Extreme fatigue Any other questions or concerns you may have after discharge  In an emergency, call 911 or go to an Emergency Department at a nearby hospital  Follow- Up Appointment:  Please call for an appointment to be seen approximately 2-3 week after surgery in [facility] with your surgeon Dr. Charlies Constable - [phone] Address: [address] [place], [place], [place] [address]

## 2021-09-18 NOTE — Hospital Course (Addendum)
Patrick Turner is a 76 year old male presenting with left hip fracture and fall. Past medical history of hypertension, HLD, GERD, CKD, T2DM, OSA, hyperkalemia, frequent falls  Fall   left intertrochanteric femur fracture s/p ORIF 1/6 Patient slid down the stairs on his bottom and got to the kitchen where he says his legs buckled and fell backwards. Was found to have left intertrochanteric hip fracture on imaging and orthopedics was consulted. Fall likely mechanical in nature given osteoporosis Orthostatic vitals was negative.  Echo showed ejection fraction of 60 to 65% and grade 1 diastolic dysfunction and thickening of mitral valve leaflets. Orthopedic surgery performed  ORIF left intertrochanteric femur fracture with septal medullary nail on 1/7 without complications. By the time of discharge patient worked with PT and tolerated the activities well. He will continue with home health PT after discharge and follow up with orthopedic group in 2-3 weeks.  HTN + Widened pulse pressure Altered home medication to carvedilol 12.5 mg twice daily and discontinued losartan (due to hyperkalemia) and metoprolol (second beta blocker). Blood pressures improved at the time of dicharge.  Confusion/ Delirium with possible Dementia  Patient's mental status waxed and waned. CT head was normal. Delirium precautions were added and minimized CNS agents. At discharge mental status was much improved. Wife reported decline in memory and mental status as baseline which is suspicious for dementia. We started patient on Donepezil and will follow PCP about this.   Accidental fentanyl overdose (in EMS) Patient was given 125 mcg fentanyl in EMS route. In ED had confusion and could not answer questions with O2 sats in 82% which he was put on oxygen for. Was given 1 mg narcan and resolution of symptoms.   Chronic conditions remained stable  PCP follow up Continue lovenox BID for 4 weeks per orthopedic surgery Outpatient workup of  possible nummular eczema on left leg Consider [facility] outpatient for dementia workup given delirium in hospital Consider SGLT2 given grade I diastolic heart failure Outpatient Home health for PT Follow up with Orthopedics in 2-3 weeks  Significant Procedures  ORIF left intertrochanteric femur fracture with septal medullary nail

## 2021-09-19 DIAGNOSIS — F03A0 Unspecified dementia, mild, without behavioral disturbance, psychotic disturbance, mood disturbance, and anxiety: Secondary | ICD-10-CM

## 2021-09-19 DIAGNOSIS — N1832 Chronic kidney disease, stage 3b: Secondary | ICD-10-CM | POA: Diagnosis not present

## 2021-09-19 DIAGNOSIS — R410 Disorientation, unspecified: Secondary | ICD-10-CM | POA: Diagnosis not present

## 2021-09-19 DIAGNOSIS — S72002A Fracture of unspecified part of neck of left femur, initial encounter for closed fracture: Secondary | ICD-10-CM | POA: Diagnosis not present

## 2021-09-19 LAB — GLUCOSE, CAPILLARY
Glucose-Capillary: 199 mg/dL — ABNORMAL HIGH (ref 70–99)
Glucose-Capillary: 160 mg/dL — ABNORMAL HIGH (ref 70–99)
Glucose-Capillary: 155 mg/dL — ABNORMAL HIGH (ref 70–99)
Glucose-Capillary: 137 mg/dL — ABNORMAL HIGH (ref 70–99)

## 2021-09-19 LAB — BASIC METABOLIC PANEL
Anion gap: 8 (ref 5–15)
BUN: 33 mg/dL — ABNORMAL HIGH (ref 8–23)
CO2: 21 mmol/L — ABNORMAL LOW (ref 22–32)
Calcium: 8 mg/dL — ABNORMAL LOW (ref 8.9–10.3)
Chloride: 107 mmol/L (ref 98–111)
Creatinine, Ser: 1.67 mg/dL — ABNORMAL HIGH (ref 0.61–1.24)
GFR, Estimated: 42 mL/min — ABNORMAL LOW (ref >60)
Glucose, Bld: 221 mg/dL — ABNORMAL HIGH (ref 70–99)
Potassium: 3.8 mmol/L (ref 3.5–5.1)
Sodium: 136 mmol/L (ref 135–145)

## 2021-09-19 MED ORDER — HYDROMORPHONE HCL 1 MG/ML IJ SOLN: 0.2500 mg | INTRAMUSCULAR | Status: DC | PRN

## 2021-09-19 MED ORDER — TRAMADOL HCL 50 MG PO TABS
25.0000 mg | ORAL_TABLET | Freq: Four times a day (QID) | ORAL | Status: DC | PRN
  Administered 2021-09-20 – 2021-09-21 (×2): 25 mg via ORAL
  Filled 2021-09-19 (×2): qty 1

## 2021-09-19 NOTE — Progress Notes (Signed)
FPTS Brief Progress Note  S: Patient seen at bedside for evening rounds. Primary RN present as well who states patient seems to be in pain because he is making groaning-like noises. He was just given his scheduled Tylenol. RN notes he has been oriented only to person this shift. He was placed on 2L nasal cannula recently due to O2 sat 89% (after receiving Dilaudid).  O: BP (!) 130/58 (BP Location: Left Arm)    Pulse 68    Temp 98.5 F (36.9 C) (Oral)    Resp 19    Ht 5\' 5"  (1.651 m)    Wt 83.5 kg    SpO2 97%    BMI 30.62 kg/m   Gen: laying in bed with eyes closed, makes soft groaning-like noises but does not appear in distress Resp: normal effort on 2L Neuro: opens eyes to voice, does not follow simple commands, does not answer any orientation questions, moves all extremities equally  A/P: Confusion Presumed delirium in the setting of previously undiagnosed mild dementia. Patient significantly confused on my exam this evening. -Close monitoring of mental status -Delirium precautions -Avoid CNS agents when possible (received Dilaudid around 11pm) -Consider head imaging if this degree of confusion persists   Remainder per daily progress note.  - Orders reviewed. Labs for AM not ordered, which was adjusted as needed.    Alcus Dad, MD 09/19/2021, 12:41 AM PGY-2, [facility] Night Resident  Please page [phone] with questions.

## 2021-09-19 NOTE — Progress Notes (Signed)
FPTS Brief Progress Note  S:Rounded in room with night RN. No acute concerns. Patient denies any pain right now and still somewhat delirious but improved from a previous exam when I saw him. Complains of dysuria (2 months when I admitted him) but not febrile or warm on my examination.   O: BP (!) 145/55 (BP Location: Left Arm)    Pulse 67    Temp 98.5 F (36.9 C) (Oral)    Resp 17    Ht 5\' 5"  (1.651 m)    Wt 83.5 kg    SpO2 100%    BMI 30.62 kg/m    Gen: NAD, awake, responsive to questions. A&O to person "Ronalee Belts", time (2023) when asked whether in home or hospital said hospital, and said he was here for broken hip (however had mentioned beforehand to him) Resp: CTAB, no iWOB CV: RRR no m/r/g  A/P: Confusion/possible delirium   Left hip fracture Ucx 1/5 showed  <10,000 COLONIES/mL INSIGNIFICANT GROWTH essentially ruling out acute UTI. Seems to be slightly more oriented on examination. Patient has been able to take some tylenol doses, sometimes till spits them up per nursing.  -tylenol 650 mg q6h -tramadol added by ortho q6h -dilaudid .25 mg q4h prn (last dose 1/8 0309 AM) - Orders reviewed. Labs for AM ordered, which was adjusted as needed.  - If condition changes, plan includes ordered.   Gerrit Heck, MD 09/19/2021, 9:32 PM PGY-1, [facility] Night Resident  Please page [phone] with questions.

## 2021-09-19 NOTE — Progress Notes (Signed)
Subjective: Patient is confused and a poor historian at the moment. Only oriented to his name. Doesn't know where he is, why he is there, or what hurts. When asked about various areas and if they hurt he says yes to all of them. Tolerating diet. Urinating but RN reports that it looks painful when he does. Not able to follow commands well to mobilize OOB. Currently sweating profusely, pulled all sheets off bed, and gown barely covering his groin. Asked RN to take his vitals.  Objective:   VITALS:   Vitals:   09/18/21 1222 09/18/21 1948 09/19/21 0348 09/19/21 0808  BP: (!) 142/55 (!) 130/58 (!) 132/43 (!) 130/53  Pulse: 64 68 66 73  Resp: 17 19 16 16   Temp:  98.5 F (36.9 C) 98.3 F (36.8 C) 98 F (36.7 C)  TempSrc:  Oral  Oral  SpO2: 98% 97% 98% 93%  Weight:      Height:       CBC Latest Ref Rng & Units 09/18/2021 09/17/2021 09/16/2021  WBC 4.0 - 10.5 K/uL 10.9(H) 8.5 7.0  Hemoglobin 13.0 - 17.0 g/dL 10.8(L) 11.3(L) 11.1(L)  Hematocrit 39.0 - 52.0 % 32.7(L) 34.0(L) 34.6(L)  Platelets 150 - 400 K/uL 199 247 233   BMP Latest Ref Rng & Units 09/19/2021 09/18/2021 09/17/2021  Glucose 70 - 99 mg/dL 221(H) 251(H) 162(H)  BUN 8 - 23 mg/dL 33(H) 22 21  Creatinine 0.61 - 1.24 mg/dL 1.67(H) 1.43(H) 1.43(H)  Sodium 135 - 145 mmol/L 136 135 136  Potassium 3.5 - 5.1 mmol/L 3.8 5.1 4.5  Chloride 98 - 111 mmol/L 107 104 105  CO2 22 - 32 mmol/L 21(L) 21(L) 22  Calcium 8.9 - 10.3 mg/dL 8.0(L) 8.6(L) 9.1   Intake/Output      01/07 0701 01/08 0700 01/08 0701 01/09 0700   P.O. 360 210   I.V. (mL/kg) 882 (10.6)    IV Piggyback     Total Intake(mL/kg) 1242 (14.9) 210 (2.5)   Urine (mL/kg/hr)  0 (0)   Blood     Total Output  0   Net +1242 +210        Urine Occurrence  1 x      Physical Exam: General: NAD. Confused. A&O x 1. Sitting up in bed. Sweating all over head and torso Resp: No increased wob Cardio: regular rate and rhythm ABD soft Neurologically intact MSK Neurovascularly  intact Sensation intact distally Intact pulses distally Dorsiflexion/Plantar flexion intact Incision: dressing C/D/I   Assessment: 2 Days Post-Op  S/P Procedure(s) (LRB): INTRAMEDULLARY (IM) NAIL INTERTROCHANTRIC (Left) by Dr. Zachery Dakins on 09/17/21  Principal Problem:   Closed left hip fracture, initial encounter [facility]) Active Problems:   Stage 3b chronic kidney disease ([place])   Plan: Minimize narcotics to try to decrease delirium  I see an abnormal urine culture from 09/16/21 but no UA results so I am going to order another UA because I think the cause of the delirium and sweating could be a UTI that needs antibiotics. I see the primary team is aware of the abnormal culture but didn't do any further work up of it. Advance diet Up with therapy Incentive Spirometry Elevate and Apply ice  Weightbearing: WBAT LLE Insicional and dressing care: Dressings left intact until follow-up and Reinforce dressings as needed Orthopedic device(s): None Showering: Keep dressing dry VTE prophylaxis:  Lovenox 30mg  bid   while inpatient , SCDs, ambulation Pain control: Tylenol, Dilaudid PRN ; Added Tramadol PRN, switched Dilaudid to only for severe  pain not relieved by Tylenol or Tramadol, Dilaudid shouldn't have been the only choice for moderate to severe pain as it is likely too strong for him Follow - up plan: 2 weeks Contact information for today:  Edmonia Lynch MD, Aggie Moats PA-C  Dispo:  TBD. Will likely need SNF     Alisa Graff Office [phone] 09/19/2021, 1:29 PM

## 2021-09-19 NOTE — Progress Notes (Signed)
Pt assisted with bfast this am and ate fair. He did not help at all and was dependent in feeding. Pt is alert but could not tell me his name this morning. When I stated "Patrick Turner", he acted like he knew that was correct.

## 2021-09-19 NOTE — Progress Notes (Signed)
Family Medicine Teaching Service Daily Progress Note Intern Pager: [phone]  Patient name: Patrick Turner Medical record number: [id number] Date of birth: 1946-07-09 Age: 76 y.o. Gender: male  Primary Care Provider: Kirk Ruths, MD Consultants: Ortho Code Status: Full  Pt Overview and Major Events to Date:  1/6 Admitted 1/7 ORIF left intertrochanteric femur fracture with septal medullary nail  Assessment and Plan:  Confusion/possible delirium Will answer to name but is not currently following commands or carrying a conversation. Multifactorial given underlying cognitive issues, surgery, medications, extended hospital stay. UCx negative -Monitor mental status -continue scheduled tylenol for pain control and prn q4h 0.25 mg dilaudid  -1/2 mIVF since not eating or drinking much/dry mucous membranes on exam -delirium precautions   Fall   left intertrochanteric femur fracture s/p ORIF 1/6 POD #2. WBAT. Surgical sight is clean.  -PT/OT -Orthostatic vitals when pain stabilized -continue dilaudid 0.25 mg q4PRN -acetaminophen 650 q6h scheduled   HTN + Widened pulse pressure, improved BP 130/53.  -monitor BP -Continue holding metoprolol and losartan -Continue carvedilol at 12.5 mg BID, consider reducing if diastolics remain low   Hyperkalemia K 3.8 today.   -Monitor on BMP   T2DM 199.  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    Objective: Temp:  [98 F (36.7 C)-98.5 F (36.9 C)] 98 F (36.7 C) (01/08 0808) Pulse Rate:  [64-73] 73 (01/08 0808) Resp:  [16-19] 16 (01/08 0808) BP: (130-142)/(43-58) 130/53 (01/08 0808) SpO2:  [93 %-98 %] 93 % (01/08 0808)  Physical Exam: General: Appears confused, no acute distress. Age appropriate. Cardiac: RRR, normal heart sounds, no murmurs Respiratory: CTAB, normal effort Abdomen: soft, nontender, nondistended Extremities: No LE edema or cyanosis. Left leg with clean surgical dressing.  Skin: Warm and dry, no rashes noted Neuro: alert, not oriented. Will answer to name but does not carry on a conversation   Laboratory: Recent Labs  Lab 09/16/21 1440 09/17/21 0032 09/18/21 0044  WBC 7.0 8.5 10.9*  HGB 11.1* 11.3* 10.8*  HCT 34.6* 34.0* 32.7*  PLT 233 247 199   Recent Labs  Lab 09/17/21 1404 09/18/21 0044 09/19/21 0437  NA 136 135 136  K 4.5 5.1 3.8  CL 105 104 107  CO2 22 21* 21*  BUN 21 22 33*  CREATININE 1.43* 1.43* 1.67*  CALCIUM 9.1 8.6* 8.0*  GLUCOSE 162* 251* 221*   Imaging/Diagnostic Tests: No new imaging.   Gerlene Fee, DO 09/19/2021, 8:45 AM PGY-3, [place] Intern pager: [phone], text pages welcome

## 2021-09-19 NOTE — Progress Notes (Signed)
Family Medicine Teaching Service Daily Progress Note Intern Pager: [phone]  Patient name: Patrick Turner Medical record number: [id number] Date of birth: [birth year] Age: 76 y.o. Gender: male  Primary Care Provider: Kirk Ruths, MD Consultants: Ortho Code Status: Full  Pt Overview and Major Events to Date:  1/6- Admitted 1/7- Left intertrochanteric femur fracture  Assessment and Plan:  Patrick Turner is a 76 year old male who presented with left hip fracture after a fall.  PMH is significant for HTN, HLD, GERD, CKD, T2DM, OSA, hyperkalemia.  Confusion and possible delirium UA was unremarkable and urine culture of insignificant growth <10,000 less likely UTI.This morning on exam patient appeared mildly sluggish and confused but oriented x3.Mental status is improved today. -Monitor mental status -continue scheduled tylenol for pain  -Continue 1/2 mIVF  Fall   Left intertrochanteric femur fracture s/p ORIF Patient is POD#3. Pain is well controlled and surgical sight is clean and dry on exam. -Continue PT/OT -Orthostatic vitals -d/c Dilaudid 0.25mg  q4h PRN -continue tramadol  -Tylenol 650 q6h scheduled  HTN BP this morning was 161/61 -Continue routine vitals -Continue holding Motoprolol and losartan  -Reduced carvedilol to 6.25mg  BID  Type II Diabetes  CBG this morning was 162 -mSSI -Continue CBG checks  Chronic and stable conditions Ledt leg rash- possible eczema CKD 3a- Cr. Stable GERD- on Protonix Depression- on Paxil 40mg  daily -HLD- on home artovostatin   FEN/GI: Regular diet PPx: Levonox Dispo:SNF pending clinical improvement . Barriers include pending work up.   Subjective:  Patrick Turner said he is doing fine.  Mental status improved however still slightly confused.  Objective: Temp:  [98 F (36.7 C)-99.6 F (37.6 C)] 98.5 F (36.9 C) (01/08 2003) Pulse Rate:  [66-79] 67 (01/08 2003) Resp:  [15-17] 17 (01/08 2003) BP: (130-145)/(43-55) 145/55  (01/08 2003) SpO2:  [93 %-100 %] 100 % (01/08 2003) Physical Exam: General:Awake, well appearing, NAD HEENT: Atraumatic, MMM, No sclera icterus CV: RRR, no murmurs, normal S1/S2 Pulm: CTAB, good WOB on RA, no crackles or wheezing Abd: Soft, no distension, no tenderness Skin: dry, warm Ext: No BLE edema, +2 Pedal and radial pulse. Neuro: oriented x3, No gross neurologic defict  Laboratory: Recent Labs  Lab 09/16/21 1440 09/17/21 0032 09/18/21 0044  WBC 7.0 8.5 10.9*  HGB 11.1* 11.3* 10.8*  HCT 34.6* 34.0* 32.7*  PLT 233 247 199   Recent Labs  Lab 09/17/21 1404 09/18/21 0044 09/19/21 0437  NA 136 135 136  K 4.5 5.1 3.8  CL 105 104 107  CO2 22 21* 21*  BUN 21 22 33*  CREATININE 1.43* 1.43* 1.67*  CALCIUM 9.1 8.6* 8.0*  GLUCOSE 162* 251* 221*      Imaging/Diagnostic Tests: No new imaging  Alen Bleacher, MD 09/19/2021, 10:36 PM PGY-1, [place] Intern pager: [phone], text pages welcome

## 2021-09-19 NOTE — Progress Notes (Signed)
Pt seems comfortable now and is resting, keeps taking off gown and cardiac monitoring, feels warm to touch, temp 98.9 orally. Other VS OK.

## 2021-09-20 ENCOUNTER — Encounter (HOSPITAL_COMMUNITY): Payer: Self-pay | Admitting: Orthopedic Surgery

## 2021-09-20 DIAGNOSIS — F03A0 Unspecified dementia, mild, without behavioral disturbance, psychotic disturbance, mood disturbance, and anxiety: Secondary | ICD-10-CM | POA: Diagnosis not present

## 2021-09-20 DIAGNOSIS — N1832 Chronic kidney disease, stage 3b: Secondary | ICD-10-CM | POA: Diagnosis not present

## 2021-09-20 DIAGNOSIS — S72002A Fracture of unspecified part of neck of left femur, initial encounter for closed fracture: Secondary | ICD-10-CM | POA: Diagnosis not present

## 2021-09-20 LAB — CBC
HCT: 25.6 % — ABNORMAL LOW (ref 39.0–52.0)
Hemoglobin: 8.7 g/dL — ABNORMAL LOW (ref 13.0–17.0)
MCH: 31.3 pg (ref 26.0–34.0)
MCHC: 34 g/dL (ref 30.0–36.0)
MCV: 92.1 fL (ref 80.0–100.0)
Platelets: 212 10*3/uL (ref 150–400)
RBC: 2.78 MIL/uL — ABNORMAL LOW (ref 4.22–5.81)
RDW: 15.9 % — ABNORMAL HIGH (ref 11.5–15.5)
WBC: 8.1 10*3/uL (ref 4.0–10.5)
nRBC: 0 % (ref 0.0–0.2)

## 2021-09-20 LAB — BASIC METABOLIC PANEL
Anion gap: 10 (ref 5–15)
BUN: 25 mg/dL — ABNORMAL HIGH (ref 8–23)
CO2: 20 mmol/L — ABNORMAL LOW (ref 22–32)
Calcium: 8.2 mg/dL — ABNORMAL LOW (ref 8.9–10.3)
Chloride: 108 mmol/L (ref 98–111)
Creatinine, Ser: 1.41 mg/dL — ABNORMAL HIGH (ref 0.61–1.24)
GFR, Estimated: 52 mL/min — ABNORMAL LOW (ref >60)
Glucose, Bld: 153 mg/dL — ABNORMAL HIGH (ref 70–99)
Potassium: 4 mmol/L (ref 3.5–5.1)
Sodium: 138 mmol/L (ref 135–145)

## 2021-09-20 LAB — URINALYSIS, ROUTINE W REFLEX MICROSCOPIC
Bilirubin Urine: NEGATIVE
Glucose, UA: NEGATIVE mg/dL
Hgb urine dipstick: NEGATIVE
Ketones, ur: NEGATIVE mg/dL
Leukocytes,Ua: NEGATIVE
Nitrite: NEGATIVE
Protein, ur: NEGATIVE mg/dL
Specific Gravity, Urine: 1.025 (ref 1.005–1.030)
pH: 6 (ref 5.0–8.0)

## 2021-09-20 LAB — GLUCOSE, CAPILLARY
Glucose-Capillary: 162 mg/dL — ABNORMAL HIGH (ref 70–99)
Glucose-Capillary: 212 mg/dL — ABNORMAL HIGH (ref 70–99)
Glucose-Capillary: 211 mg/dL — ABNORMAL HIGH (ref 70–99)
Glucose-Capillary: 171 mg/dL — ABNORMAL HIGH (ref 70–99)

## 2021-09-20 MED ORDER — CARVEDILOL 6.25 MG PO TABS
6.2500 mg | ORAL_TABLET | Freq: Two times a day (BID) | ORAL | Status: DC
  Administered 2021-09-20 – 2021-09-22 (×4): 6.25 mg via ORAL
  Filled 2021-09-20 (×4): qty 1

## 2021-09-20 NOTE — Progress Notes (Signed)
FPTS Brief Progress Note  S: Patient resting comfortably in bed asleep   O: BP (!) 149/63 (BP Location: Left Arm)    Pulse 69    Temp 98.6 F (37 C) (Oral)    Resp 16    Ht 5\' 5"  (1.651 m)    Wt 83.5 kg    SpO2 100%    BMI 30.62 kg/m   General: NAD, sleeping in bed Respiratory: no increased work of breathing  A/P: Fall   left hip fracture   delirium -Pain control with tramadol and Tylenol -Delirium precautions  HTN Diastolic slightly improved to 60s with decrease carvedilol however systolics increased to 291B-166M. -Continue carvedilol 6.25 twice daily -Monitor blood pressures - Orders reviewed. Labs for AM ordered, which was adjusted as needed.    Gerrit Heck, MD 09/20/2021, 9:03 PM PGY-1, [place] [facility] Night Resident  Please page [phone] with questions.

## 2021-09-20 NOTE — Progress Notes (Addendum)
Family Medicine Teaching Service Daily Progress Note Intern Pager: [phone]  Patient name: Patrick Turner Medical record number: [id number] Date of birth: [birth year] Age: 76 y.o. Gender: male  Primary Care Provider: Kirk Ruths, MD Consultants: Ortho Code Status: Full  Pt Overview and Major Events to Date:  1/his-admitted 1/7-left intratrochanteric femur ORIF  Assessment and Plan:  Patrick Turner is a 76 year old male who presented with a left hip fracture after a fall.  PMH is significant for T2DM, CKD, HTN, HLD, GERD, hypokalemia.  Confusion possible dementia Patient this morning appears less confused, hard to say if he is at baseline. However his mental status continues to improve. This morning he said he had his first BM in days.  And on exam oriented x3. -Continue to monitor mental status -Continue scheduled Tylenol for pain -Continue half mIVF  Fall   left intertrochanteric femur fracture s/p ORIF Patient is POD#4.  Uncontrolled and surgical site is clean. -Continue PT/OH -Continue tramadol for pain control -Continue scheduled Tylenol 650 Q6H  HTN BP this morning was 162/57 -Continue routine vitals -Continue holding metoprolol and losartan -Continue carvedilol 6.25 twice daily  Type 2 diabetes CBG this morning was189 -mSSI -Continue CBG checks  Chronic and stable conditions Left leg rash-possible eczema CKD 3 AAA-creatinine stable GERD-on Protonix Depression-on Paxil 40 mg daily HLD-on atorvastatin   FEN/GI: Regular diet PPx: Lovenox Dispo:SNF pending clinical improvement . Barriers include clinical status.   Subjective:  Patrick Turner is awake and sitting up at bedside. He is doing well and had his first bowel movement in days.  No complaint at this time.  Objective: Temp:  [98 F (36.7 C)-98.5 F (36.9 C)] 98.1 F (36.7 C) (01/09 1557) Pulse Rate:  [67-74] 72 (01/09 1557) Resp:  [17-18] 18 (01/09 1557) BP: (145-161)/(55-61) 145/55 (01/09  1557) SpO2:  [96 %-100 %] 97 % (01/09 1557) Physical Exam: General:Awake, well appearing, NAD HEENT: Atraumatic, MMM, No sclera icterus CV: RRR, no murmurs, normal S1/S2 Pulm: CTAB, good WOB on RA, no crackles or wheezing Abd: Soft, no distension, no tenderness Skin: dry, warm Ext: No BLE edema, Surgery site is clean and dry  Laboratory: Recent Labs  Lab 09/17/21 0032 09/18/21 0044 09/20/21 0219  WBC 8.5 10.9* 8.1  HGB 11.3* 10.8* 8.7*  HCT 34.0* 32.7* 25.6*  PLT 247 199 212   Recent Labs  Lab 09/18/21 0044 09/19/21 0437 09/20/21 0219  NA 135 136 138  K 5.1 3.8 4.0  CL 104 107 108  CO2 21* 21* 20*  BUN 22 33* 25*  CREATININE 1.43* 1.67* 1.41*  CALCIUM 8.6* 8.0* 8.2*  GLUCOSE 251* 221* 153*      Imaging/Diagnostic Tests: No new imaging  Alen Bleacher, MD 09/20/2021, 7:25 PM PGY-1, [place] Intern pager: [phone], text pages welcome

## 2021-09-20 NOTE — Progress Notes (Signed)
° ° ° °  Subjective: Patient remains moderately confused and disoriented this morning.  Not complaining of any pain.  Resting comfortably.  Denies distal numbness and tingling.  Concern for possible UTI.  Objective:   VITALS:   Vitals:   09/19/21 1628 09/19/21 1645 09/19/21 2003 09/20/21 0518  BP: (!) 132/46  (!) 145/55 (!) 159/61  Pulse: 78 79 67 74  Resp: 15 16 17 17   Temp: 99.6 F (37.6 C) 98.9 F (37.2 C) 98.5 F (36.9 C) 98 F (36.7 C)  TempSrc: Oral Oral Oral   SpO2: 96% 95% 100% 96%  Weight:      Height:        Sensation intact distally Intact pulses distally Dorsiflexion/Plantar flexion intact Incision: dressing C/D/I Compartment soft   Lab Results  Component Value Date   WBC 8.1 09/20/2021   HGB 8.7 (L) 09/20/2021   HCT 25.6 (L) 09/20/2021   MCV 92.1 09/20/2021   PLT 212 09/20/2021   BMET    Component Value Date/Time   NA 138 09/20/2021 0219   NA 139 11/24/2012 0716   K 4.0 09/20/2021 0219   K 4.4 11/24/2012 0716   CL 108 09/20/2021 0219   CL 107 11/24/2012 0716   CO2 20 (L) 09/20/2021 0219   CO2 29 11/24/2012 0716   GLUCOSE 153 (H) 09/20/2021 0219   GLUCOSE 180 (H) 11/24/2012 0716   BUN 25 (H) 09/20/2021 0219   BUN 20 (H) 11/24/2012 0716   CREATININE 1.41 (H) 09/20/2021 0219   CREATININE 1.27 11/24/2012 0716   CALCIUM 8.2 (L) 09/20/2021 0219   CALCIUM 8.4 (L) 11/24/2012 0716   GFRNONAA 52 (L) 09/20/2021 0219   GFRNONAA 58 (L) 11/24/2012 0716     Assessment/Plan: 3 Days Post-Op   Principal Problem:   Closed left hip fracture, initial encounter ([facility]) Active Problems:   Stage 3b chronic kidney disease ([facility])   Mild dementia  Left intertrochanteric femur fx s/p ORIF 1/6  Post op recs: WB: WBAT LLE Abx: Clindamycin x23 hours Imaging: PACU xrays Dressing: keep intact until follow up, change PRN if soiled or saturated. DVT prophylaxis: lovenox x4 weeks starting POD1 (patient is no longer taking plavix at home) Follow up: 2 weeks after  surgery for a wound check with Dr. Zachery Dakins at [facility].  Address: [address] [place], [place], [place] [address]  Office Phone: [phone]    Willaim Sheng 09/20/2021, 7:12 AM   Charlies Constable, MD  Contact information:   [phone] 7am-5pm epic message Dr. Zachery Dakins, or call office for patient follow up: [phone] After hours and holidays please check [email or web address] for group call information for [facility]

## 2021-09-20 NOTE — Care Management Important Message (Signed)
Important Message  Patient Details  Name: Patrick Turner MRN: [id number] Date of Birth: 24-Jul-1946   Medicare Important Message Given:  Yes     Hannah Beat 09/20/2021, 11:55 AM

## 2021-09-20 NOTE — Progress Notes (Signed)
Assumed care of pt at this time.

## 2021-09-21 ENCOUNTER — Other Ambulatory Visit (HOSPITAL_COMMUNITY): Payer: Self-pay

## 2021-09-21 DIAGNOSIS — F03A0 Unspecified dementia, mild, without behavioral disturbance, psychotic disturbance, mood disturbance, and anxiety: Secondary | ICD-10-CM | POA: Diagnosis not present

## 2021-09-21 DIAGNOSIS — R410 Disorientation, unspecified: Secondary | ICD-10-CM | POA: Diagnosis not present

## 2021-09-21 DIAGNOSIS — N1832 Chronic kidney disease, stage 3b: Secondary | ICD-10-CM | POA: Diagnosis not present

## 2021-09-21 DIAGNOSIS — S72002A Fracture of unspecified part of neck of left femur, initial encounter for closed fracture: Secondary | ICD-10-CM | POA: Diagnosis not present

## 2021-09-21 LAB — CBC
HCT: 25.4 % — ABNORMAL LOW (ref 39.0–52.0)
Hemoglobin: 8.3 g/dL — ABNORMAL LOW (ref 13.0–17.0)
MCH: 30.7 pg (ref 26.0–34.0)
MCHC: 32.7 g/dL (ref 30.0–36.0)
MCV: 94.1 fL (ref 80.0–100.0)
Platelets: 193 10*3/uL (ref 150–400)
RBC: 2.7 MIL/uL — ABNORMAL LOW (ref 4.22–5.81)
RDW: 15.9 % — ABNORMAL HIGH (ref 11.5–15.5)
WBC: 7.3 10*3/uL (ref 4.0–10.5)
nRBC: 0 % (ref 0.0–0.2)

## 2021-09-21 LAB — GLUCOSE, CAPILLARY
Glucose-Capillary: 168 mg/dL — ABNORMAL HIGH (ref 70–99)
Glucose-Capillary: 201 mg/dL — ABNORMAL HIGH (ref 70–99)
Glucose-Capillary: 191 mg/dL — ABNORMAL HIGH (ref 70–99)
Glucose-Capillary: 217 mg/dL — ABNORMAL HIGH (ref 70–99)

## 2021-09-21 LAB — BASIC METABOLIC PANEL
Anion gap: 8 (ref 5–15)
BUN: 24 mg/dL — ABNORMAL HIGH (ref 8–23)
CO2: 19 mmol/L — ABNORMAL LOW (ref 22–32)
Calcium: 7.6 mg/dL — ABNORMAL LOW (ref 8.9–10.3)
Chloride: 111 mmol/L (ref 98–111)
Creatinine, Ser: 1.19 mg/dL (ref 0.61–1.24)
GFR, Estimated: >60 mL/min (ref >60)
Glucose, Bld: 189 mg/dL — ABNORMAL HIGH (ref 70–99)
Potassium: 4 mmol/L (ref 3.5–5.1)
Sodium: 138 mmol/L (ref 135–145)

## 2021-09-21 MED ORDER — DONEPEZIL HCL 5 MG PO TABS
5.0000 mg | ORAL_TABLET | Freq: Every day | ORAL | Status: DC
  Administered 2021-09-21: 5 mg via ORAL
  Filled 2021-09-21: qty 1

## 2021-09-21 NOTE — TOC Progression Note (Signed)
Transition of Care [facility]) - Progression Note    Patient Details  Name: Patrick Turner MRN: [id number] Date of Birth: 04-07-1946  [facility] of [facility]) CM/SW Contact  Jacalyn Lefevre Edson Snowball, RN Phone Number: 09/21/2021, 4:50 PM  Clinical Narrative:     Tommi Rumps with [facility] accepted referral for home health PT/OT.   NCM secure chatted MD for orders and face to face.   SW has asked PT for DME recommendations , await response   Expected Discharge Plan: [place] Barriers to Discharge: Continued Medical Work up  Expected Discharge Plan and Services Expected Discharge Plan: [place]                                               Social Determinants of Health (SDOH) Interventions    Readmission Risk Interventions No flowsheet data found.

## 2021-09-21 NOTE — TOC Initial Note (Signed)
Transition of Care [facility]) - Initial/Assessment Note    Patient Details  Name: Patrick Turner MRN: [id number] Date of Birth: 1946/05/27  Transition of Care [facility]) CM/SW Contact:    Emeterio Reeve, LCSW Phone Number: 09/21/2021, 4:39 PM  Clinical Narrative:                  CSW received SNF consult. CSW met with pt at bedside. CSW introduced self and explained role at the hospital. Pt reports that PTA he lived at home with his wife.   CSW reviewed PT/OT recommendations for SNF. Pt reports that he wants to go home, not to a SNF. Pt gave CSW permission to talk to his wife Patrick Turner stated they already talked about it and she is fine with him coming home. She said shes with him 24/7 and she feels like he will be just fine at home. Pam reports pt has a walker. CSW informed her that if PT recommends any other equipment for home, it can be ordered. Wife stated shes fine with HHPT and has no preference on agency.   CSW will continue to follow.   Expected Discharge Plan: Patrick Turner Barriers to Discharge: Continued Medical Work up   Patient Goals and CMS Choice Patient states their goals for this hospitalization and ongoing recovery are:: To get better CMS Medicare.gov Compare Post Acute Care list provided to:: Patient Choice offered to / list presented to : Spouse, Patient  Expected Discharge Plan and Services Expected Discharge Plan: [place]                                              Prior Living Arrangements/Services   Lives with:: Spouse Patient language and need for interpreter reviewed:: Yes Do you feel safe going back to the place where you live?: Yes        Care giver support system in place?: Yes (comment)   Criminal Activity/Legal Involvement Pertinent to Current Situation/Hospitalization: No - Comment as needed  Activities of Daily Living Home Assistive Devices/Equipment: CBG Meter, Hearing aid ADL Screening (condition  at time of admission) Patient's cognitive ability adequate to safely complete daily activities?: Yes Is the patient deaf or have difficulty hearing?: Yes Does the patient have difficulty seeing, even when wearing glasses/contacts?: No Does the patient have difficulty concentrating, remembering, or making decisions?: No Patient able to express need for assistance with ADLs?: Yes Does the patient have difficulty dressing or bathing?: Yes Independently performs ADLs?: No Communication: Independent Dressing (OT): Needs assistance Is this a change from baseline?: Change from baseline, expected to last >3 days Grooming: Independent Feeding: Independent Bathing: Needs assistance Is this a change from baseline?: Change from baseline, expected to last >3 days Toileting: Needs assistance Is this a change from baseline?: Change from baseline, expected to last >3days In/Out Bed: Needs assistance Is this a change from baseline?: Change from baseline, expected to last >3 days Walks in Home: Needs assistance Is this a change from baseline?: Change from baseline, expected to last >3 days Does the patient have difficulty walking or climbing stairs?: Yes Weakness of Legs: Left (Hip Fx) Weakness of Arms/Hands: None  Permission Sought/Granted Permission sought to share information with : Facility Sport and exercise psychologist Permission granted to share information with : Yes, Verbal Permission Granted  Share Information with NAME: Jahmarion Popoff, wife,  [place] granted to share info w AGENCY: HHPT        Emotional Assessment Appearance:: Appears stated age Attitude/Demeanor/Rapport: Engaged Affect (typically observed): Appropriate Orientation: : Oriented to Place, Oriented to Self, Oriented to  Time, Oriented to Situation Alcohol / Substance Use: Not Applicable Psych Involvement: No (comment)  Admission diagnosis:  Accidental fentanyl overdose, initial encounter ([place]) [T40.411A] Closed  nondisplaced intertrochanteric fracture of left femur, initial encounter ([place]) [S72.145A] Stage 3b chronic kidney disease ([place]) [N18.32] Closed left hip fracture, initial encounter ([place]) [S72.002A] Patient Active Problem List   Diagnosis Date Noted   Mild dementia    Stage 3b chronic kidney disease (Patrick Turner)    Closed left hip fracture, initial encounter ([place]) 09/16/2021   Anemia in chronic kidney disease 05/28/2020   Benign hypertensive kidney disease with chronic kidney disease 05/28/2020   Proteinuria 05/28/2020   Falls frequently 04/27/2020   Use of cane as ambulatory aid 04/27/2020   Numbness and tingling of both legs 02/18/2020   Primary osteoarthritis of left knee 01/04/2020   Diabetic retinopathy (Patrick Turner) 01/01/2020   ASCVD (arteriosclerotic cardiovascular disease) 10/16/2019   Hospitalization within last 30 days 10/16/2019   Hyperkalemia 10/14/2019   Pseudoaneurysm of right femoral artery (Patrick Turner) 10/14/2019   AKI (acute kidney injury) (Patrick Turner) 10/13/2019   Unstable angina (Patrick Turner) 10/02/2019   Chest pain 10/01/2019   Uncontrolled type 2 diabetes mellitus with hyperglycemia, with long-term current use of insulin (Patrick Turner) 09/27/2019   Chronic mesenteric ischemia (Patrick Turner) 05/06/2019   Abdominal pain 05/03/2019   Acute on chronic renal failure ([place]) 03/23/2019   AMS (altered mental status) 12/24/2018   Dizziness 06/30/2018   HLD (hyperlipidemia) 02/23/2018   Aortic atherosclerosis ([place]) 11/02/2017   Healthcare maintenance 12/26/2016   DM type 2 with diabetic peripheral neuropathy (Patrick Turner) 11/09/2016   Presence of right artificial knee joint 08/14/2016   Bilateral carotid artery disease (Patrick Turner) 07/12/2016   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   Acute diarrhea 05/30/2016   Encephalopathy acute 05/30/2016   Anemia 05/30/2016   Acute on chronic renal insufficiency 05/30/2016   Diarrhea 05/30/2016   Chronic kidney disease, stage 3a (Patrick Turner) 03/30/2015   Depression, major, in remission (Patrick Turner)  08/02/2014   Severe obesity (BMI 35.0-39.9) with comorbidity (Patrick Turner) 07/12/2014   GERD (gastroesophageal reflux disease) 03/08/2014   Hyperlipidemia associated with type 2 diabetes mellitus ([place]) 03/08/2014   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 [place] [address] Phone: [phone] Fax: [phone]     Social Determinants of Health (SDOH) Interventions    Readmission Risk Interventions No flowsheet data found.  Emeterio Reeve, LCSW Clinical Social Worker

## 2021-09-21 NOTE — Progress Notes (Incomplete)
Family Medicine Teaching Service Daily Progress Note Intern Pager: [phone]  Patient name: Patrick Turner Medical record number: [id number] Date of birth: 1946-01-10 Age: 76 y.o. Gender: male  Primary Care Provider: Kirk Ruths, MD Consultants: Ortho Code Status: Full  Pt Overview and Major Events to Date:  1/6-admitted 1/7-left intertrochanteric femur ORIF  Assessment and Plan:  In the OR is a 76 year old male who presented with left hip fracture after a fall.  PMH is significant for T2DM, CKD, GERD, HLD, HTN, and hypokalemia.  Patient is medically stable and awaiting discharge to SNF  Confusion and possible dementia Patient's mental status continues to improve. -Continue to monitor mental status -Continue scheduled Tylenol -Continued mIVF  Fall   left intertrochanteric femur fracture s/p ORIF Patient is POD #5.  Pain is well controlled and surgical site is clean. -Continue PT/OT -Continue tramadol for pain control -Continue scheduled Tylenol 650 every 6 hours  HTN BP this morning was*** -Continue routine vitals -Continue with metoprolol and losartan -Continue carvedilol 6.25 twice daily  T2 diabetes CBG this morning was*** -mSSI -Continue CBG checks  Other chronic and stable conditions Left leg rash-possible eczema CKD 3A-creatinine stable GERD-on Protonix Depression-on Paxil 40 mg daily HLD-on atorvastatin  FEN/GI: Regular diet PPx: Lovenox Dispo:SNF  awaiting SNF placement . Barriers include bed availability.   Subjective:  ***  Objective: Temp:  [97.5 F (36.4 C)-98.6 F (37 C)] 97.9 F (36.6 C) (01/10 0841) Pulse Rate:  [62-72] 62 (01/10 0841) Resp:  [16-18] 18 (01/10 0841) BP: (145-162)/(55-65) 153/65 (01/10 0841) SpO2:  [95 %-100 %] 100 % (01/10 0841) Physical Exam: General: *** Cardiovascular: *** Respiratory: *** Abdomen: *** Extremities: ***  Laboratory: Recent Labs  Lab 09/18/21 0044 09/20/21 0219 09/21/21 0007  WBC  10.9* 8.1 7.3  HGB 10.8* 8.7* 8.3*  HCT 32.7* 25.6* 25.4*  PLT 199 212 193   Recent Labs  Lab 09/19/21 0437 09/20/21 0219 09/21/21 0007  NA 136 138 138  K 3.8 4.0 4.0  CL 107 108 111  CO2 21* 20* 19*  BUN 33* 25* 24*  CREATININE 1.67* 1.41* 1.19  CALCIUM 8.0* 8.2* 7.6*  GLUCOSE 221* 153* 189*    ***  Imaging/Diagnostic Tests: Alen Bleacher, MD 09/21/2021, 3:19 PM PGY-***, [place] Intern pager: [phone], text pages welcome

## 2021-09-21 NOTE — TOC Benefit Eligibility Note (Signed)
Patient Teacher, English as a foreign language completed.    The patient is currently admitted and upon discharge could be taking enoxaparin (Lovenox) 30 mg/0.3 ml.  The current 30 day co-pay is, $95.00.   The patient is insured through [place], [place] Patient Advocate Specialist [place] Patient Advocate Team Direct Number: [phone]  Fax: [phone]

## 2021-09-21 NOTE — Progress Notes (Signed)
Inpatient Diabetes Program Recommendations  AACE/ADA: New Consensus Statement on Inpatient Glycemic Control (2015)  Target Ranges:  Prepandial:   less than 140 mg/dL      Peak postprandial:   less than 180 mg/dL (1-2 hours)      Critically ill patients:  140 - 180 mg/dL   Lab Results  Component Value Date   GLUCAP 168 (H) 09/21/2021   HGBA1C 7.6 (H) 09/17/2021   Diabetes history: Type 2 DM Outpatient Diabetes medications: Levemir 15 units QHS, Actos 15 mg QD, Ozempic 1 mg qwk, Metformin 500 mg BID Current orders for Inpatient glycemic control: Novolog 0-15 units TID   Inpatient Diabetes Program Recommendations:     Consider adding Levemir 8 units QHS and changing diet to carb modified, if appropriate.   Thanks, Bronson Curb, MSN, RNC-OB Diabetes Coordinator [phone] (8a-5p)

## 2021-09-21 NOTE — Progress Notes (Signed)
Physical Therapy Treatment Patient Details Name: Patrick Turner MRN: [id number] DOB: 29-May-1946 Today's Date: 09/21/2021   History of Present Illness 76 yo male with onset of fall of unclear nature was admitted 1/5 for ORIF to L intertrochanteric femur fracture.  Pt was alternately orthostatic and hypertensive upon admission, now more normotensive.  Pt is confused at baseline but very limited ability to follow instructions now.  PMHx:  DM, eczema, OSA, GERD, depression, CKD3a, HLD, SMA stenosis, CAD    PT Comments    Pt progressing well towards all goals. Pt pleasant and oriented, eager to get up OOB. Reports minimal pain. Pt began gait training today with RW and tolerated well despite noted L LE antalgia. Acute PT to cont to follow to progress mobility.    Recommendations for follow up therapy are one component of a multi-disciplinary discharge planning process, led by the attending physician.  Recommendations may be updated based on patient status, additional functional criteria and insurance authorization.  Follow Up Recommendations  Skilled nursing-short term rehab (<3 hours/day)     Assistance Recommended at Discharge Frequent or constant Supervision/Assistance  Patient can return home with the following A little help with walking and/or transfers;A lot of help with bathing/dressing/bathroom;Assistance with cooking/housework;Direct supervision/assist for medications management;Direct supervision/assist for financial management;Assist for transportation   Equipment Recommendations  Rolling walker (2 wheels)    Recommendations for Other Services       Precautions / Restrictions Precautions Precautions: Fall;Other (comment) Precaution Comments: very HOH Restrictions Weight Bearing Restrictions: Yes LLE Weight Bearing: Weight bearing as tolerated     Mobility  Bed Mobility Overal bed mobility: Needs Assistance Bed Mobility: Supine to Sit     Supine to sit: Mod assist      General bed mobility comments: max directional verbal cues, modA for L LE management and trunk elevation    Transfers Overall transfer level: Needs assistance Equipment used: Rolling walker (2 wheels) Transfers: Sit to/from Stand Sit to Stand: Mod assist           General transfer comment: verbal cues for hand placement, modA to power up, denies L LE increased pain with WBing    Ambulation/Gait Ambulation/Gait assistance: Min assist;+2 safety/equipment (2nd person for chair follow) Gait Distance (Feet): 70 Feet Assistive device: Rolling walker (2 wheels) Gait Pattern/deviations: Decreased stance time - left;Decreased stride length;Decreased weight shift to left Gait velocity: dec Gait velocity interpretation: <1.31 ft/sec, indicative of household ambulator   General Gait Details: pt with noted antalgia on L LE however pt denies worsening of pain, pt progressed from step to, to step through pattern with shortened step length   Stairs             Wheelchair Mobility    Modified Rankin (Stroke Patients Only)       Balance Overall balance assessment: Needs assistance;History of Falls Sitting-balance support: Feet supported;No upper extremity supported Sitting balance-Leahy Scale: Fair     Standing balance support: During functional activity;Bilateral upper extremity supported;Reliant on assistive device for balance Standing balance-Leahy Scale: Poor                              Cognition Arousal/Alertness: Awake/alert Behavior During Therapy: WFL for tasks assessed/performed Overall Cognitive Status: No family/caregiver present to determine baseline cognitive functioning  General Comments: per chart pt with confusion at baseline. Pt aware he is in the hospital and knows he fell but doesn't remember falling or how it happend. Pt able to follow commands and very cooperative        Exercises General  Exercises - Lower Extremity Quad Sets: AROM;Left;10 reps;Supine (with 5 sec hold) Gluteal Sets: AROM;Both;10 reps;Supine Long Arc Quad: AROM;Left;10 reps;Seated    General Comments General comments (skin integrity, edema, etc.): Pt with 3 incision sites covered by dressings      Pertinent Vitals/Pain Pain Assessment: Faces Faces Pain Scale: Hurts a little bit Pain Location: L hip with mobiltiy and WBing, reports "it's really not that bad" Pain Descriptors / Indicators: Discomfort    Home Living                          Prior Function            PT Goals (current goals can now be found in the care plan section) Progress towards PT goals: Progressing toward goals    Frequency    Min 3X/week      PT Plan Current plan remains appropriate    Co-evaluation              AM-PAC PT "6 Clicks" Mobility   Outcome Measure  Help needed turning from your back to your side while in a flat bed without using bedrails?: A Lot Help needed moving from lying on your back to sitting on the side of a flat bed without using bedrails?: A Lot Help needed moving to and from a bed to a chair (including a wheelchair)?: A Lot Help needed standing up from a chair using your arms (e.g., wheelchair or bedside chair)?: A Lot Help needed to walk in hospital room?: A Lot Help needed climbing 3-5 steps with a railing? : Total 6 Click Score: 11    End of Session Equipment Utilized During Treatment: Gait belt Activity Tolerance: Patient tolerated treatment well Patient left: in chair;with call bell/phone within reach;with chair alarm set Nurse Communication: Mobility status PT Visit Diagnosis: Muscle weakness (generalized) (M62.81);Pain;History of falling (Z91.81) Pain - Right/Left: Left Pain - part of body: Hip     Time: 0757-0822 PT Time Calculation (min) (ACUTE ONLY): 25 min  Charges:  $Gait Training: 8-22 mins $Therapeutic Exercise: 8-22 mins                     Kittie Plater, PT, DPT Acute Rehabilitation Services Pager #: [phone] Office #: [phone]    Berline Lopes 09/21/2021, 3:20 PM

## 2021-09-21 NOTE — Progress Notes (Signed)
Mobility Specialist Progress Note:   09/21/21 1530  Mobility  Activity Transferred:  Chair to bed  Level of Assistance Standby assist, set-up cues, supervision of patient - no hands on  Assistive Device Front wheel walker  LLE Weight Bearing WBAT  Distance Ambulated (ft) 3 ft  Mobility Response Tolerated well  Mobility performed by Mobility specialist  $Mobility charge 1 Mobility   Pt requesting to get back in bed. Pt did not require any physical assistance throughout transfer. Left pt in bed with all needs met.   Nelta Numbers Mobility Specialist  Phone [phone]

## 2021-09-22 ENCOUNTER — Encounter: Payer: Self-pay | Admitting: Student

## 2021-09-22 DIAGNOSIS — N1832 Chronic kidney disease, stage 3b: Secondary | ICD-10-CM | POA: Diagnosis not present

## 2021-09-22 DIAGNOSIS — R410 Disorientation, unspecified: Secondary | ICD-10-CM | POA: Diagnosis not present

## 2021-09-22 DIAGNOSIS — S72002A Fracture of unspecified part of neck of left femur, initial encounter for closed fracture: Secondary | ICD-10-CM | POA: Diagnosis not present

## 2021-09-22 DIAGNOSIS — F03A0 Unspecified dementia, mild, without behavioral disturbance, psychotic disturbance, mood disturbance, and anxiety: Secondary | ICD-10-CM | POA: Diagnosis not present

## 2021-09-22 LAB — GLUCOSE, CAPILLARY
Glucose-Capillary: 176 mg/dL — ABNORMAL HIGH (ref 70–99)
Glucose-Capillary: 194 mg/dL — ABNORMAL HIGH (ref 70–99)

## 2021-09-22 MED ORDER — DONEPEZIL HCL 5 MG PO TABS: 5.0000 mg | ORAL_TABLET | Freq: Every day | ORAL | 0 refills | Status: DC

## 2021-09-22 MED ORDER — ENOXAPARIN SODIUM 30 MG/0.3ML IJ SOSY
30.0000 mg | PREFILLED_SYRINGE | Freq: Two times a day (BID) | INTRAMUSCULAR | 0 refills | Status: DC
Start: 2021-09-22 — End: 2022-11-04

## 2021-09-22 NOTE — Discharge Summary (Signed)
[facility] Discharge Summary  Patient name: Patrick Turner Medical record number: [id number] Date of birth: November 01, 1945 Age: 76 y.o. Gender: male Date of Admission: 09/16/2021  Date of Discharge: 09/22/2021 Admitting Physician: Gerrit Heck, MD  Primary Care Provider: Kirk Ruths, MD Consultants: Ortho  Indication for Hospitalization: Fall  Discharge Diagnoses/Problem List:  Left intertrochanteric femur fracture  Disposition: Home health  Discharge Condition: Stable  Discharge Exam:  General:Awake, well appearing, NAD HEENT: Atraumatic, MMM, No sclera icterus CV: RRR, no murmurs, normal S1/S2 Pulm: CTAB, good WOB on RA, no crackles or wheezing Abd: Soft, no distension, no tenderness Skin: dry, warm Ext: No BLE edema, +2 Pedal and radial pulse, surgical site dry and clean Neuro: Oriented x3, no gross neuro deficit  Brief Hospital Course:  Patrick Turner is a 76 year old male presenting with left hip fracture and fall. Past medical history of hypertension, HLD, GERD, CKD, T2DM, OSA, hyperkalemia, frequent falls  Fall   left intertrochanteric femur fracture s/p ORIF 1/6 Patient slid down the stairs on his bottom and got to the kitchen where he says his legs buckled and fell backwards. Was found to have left intertrochanteric hip fracture on imaging and orthopedics was consulted. Fall likely mechanical in nature given osteoporosis Orthostatic vitals was negative.  Echo showed ejection fraction of 60 to 65% and grade 1 diastolic dysfunction and thickening of mitral valve leaflets. Orthopedic surgery performed  ORIF left intertrochanteric femur fracture with septal medullary nail on 1/7 without complications. By the time of discharge patient worked with PT and tolerated the activities well. He will continue with home health PT after discharge and follow up with orthopedic group in 2-3 weeks.  HTN + Widened pulse pressure Altered home medication to  carvedilol 12.5 mg twice daily and discontinued losartan (due to hyperkalemia) and metoprolol (second beta blocker). Blood pressures improved at the time of dicharge.  Confusion/ Delirium with possible Dementia  Patient's mental status waxed and waned. CT head was normal. Delirium precautions were added and minimized CNS agents. At discharge mental status was much improved. Wife reported decline in memory and mental status as baseline which is suspicious for dementia. We started patient on Donepezil and will follow PCP about this.   Accidental fentanyl overdose (in EMS) Patient was given 125 mcg fentanyl in EMS route. In ED had confusion and could not answer questions with O2 sats in 82% which he was put on oxygen for. Was given 1 mg narcan and resolution of symptoms.   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 Consider [facility] outpatient for dementia workup given delirium in hospital Consider SGLT2 given grade I diastolic heart failure Outpatient Home health for PT Follow up with Orthopedics in 2-3 weeks  Significant Procedures  ORIF left intertrochanteric femur fracture with septal medullary nail   Significant Procedures: Left femur ORIF  Significant Labs and Imaging:  Recent Labs  Lab 09/18/21 0044 09/20/21 0219 09/21/21 0007  WBC 10.9* 8.1 7.3  HGB 10.8* 8.7* 8.3*  HCT 32.7* 25.6* 25.4*  PLT 199 212 193   Recent Labs  Lab 09/17/21 1404 09/18/21 0044 09/19/21 0437 09/20/21 0219 09/21/21 0007  NA 136 135 136 138 138  K 4.5 5.1 3.8 4.0 4.0  CL 105 104 107 108 111  CO2 22 21* 21* 20* 19*  GLUCOSE 162* 251* 221* 153* 189*  BUN 21 22 33* 25*  24*  CREATININE 1.43* 1.43* 1.67* 1.41* 1.19  CALCIUM 9.1 8.6* 8.0* 8.2* 7.6*    DG Chest 1 View  Result Date: 09/16/2021 CLINICAL DATA:  Provided history: Fall. Additional history provided: Episode of near  syncope (with head trauma). Patient reports left hip pain. EXAM: CHEST  1 VIEW COMPARISON:  CT angiogram chest/abdomen/pelvis 10/23/2020. Prior chest radiographs 10/23/2020 and earlier. FINDINGS: Heart size within normal limits. No appreciable airspace consolidation. No evidence of pleural effusion or pneumothorax. No acute bony abnormality identified. A 2.3 cm round to ovoid radiodense foreign body projects over the midline lower neck. Impression #1 was called by telephone at the time of interpretation on 09/16/2021 at 2:06 pm to provider [facility] , who verbally acknowledged these results. IMPRESSION: A 2.3 cm round to ovoid radiodense foreign body projects over the midline lower neck. This may overlie the patient. However, an aspirated/ingested foreign body cannot be excluded, and clinical correlation is recommended. Otherwise, no evidence of acute cardiopulmonary abnormality. Electronically Signed   By: Kellie Simmering D.O.   On: 09/16/2021 14:06   DG Knee 2 Views Left  Result Date: 09/16/2021 CLINICAL DATA:  Fall EXAM: LEFT KNEE - 1-2 VIEW COMPARISON:  None. FINDINGS: Suboptimal positioning. No fracture identified. True lateral view not obtained. Advanced joint space narrowing medially with associated spurring. Mild widening of the lateral joint space. Arterial calcification IMPRESSION: Negative for fracture.  Suboptimal positioning. Electronically Signed   By: Franchot Gallo M.D.   On: 09/16/2021 14:02   CT HEAD WO CONTRAST  Result Date: 09/16/2021 CLINICAL DATA:  Fall at home.  Head trauma. EXAM: CT HEAD WITHOUT CONTRAST CT CERVICAL SPINE WITHOUT CONTRAST TECHNIQUE: Multidetector CT imaging of the head and cervical spine was performed following the standard protocol without intravenous contrast. Multiplanar CT image reconstructions of the cervical spine were also generated. COMPARISON:  01/21/2021 FINDINGS: CT HEAD FINDINGS Brain: No evidence of acute infarction, hemorrhage, hydrocephalus, extra-axial  collection or mass lesion/mass effect. Mild atrophy and mild chronic white matter changes. Vascular: Negative for hyperdense vessel. Mild calcification right M1 segment. Skull: Negative Sinuses/Orbits: Mild mucosal edema paranasal sinuses. Bilateral cataract extraction hospital Other: None CT CERVICAL SPINE FINDINGS Alignment: Mild anterolisthesis C3-4 Skull base and vertebrae: Negative for fracture Soft tissues and spinal canal: Negative for soft tissue swelling or mass. Disc levels: Disc degeneration and spurring C4-5, C5-6, C6-7. Mild foraminal narrowing bilaterally C5-6 and C6-7. Upper chest: Lung apices clear bilaterally Other: None IMPRESSION: 1. No acute intracranial abnormality 2. Negative for cervical spine fracture. Electronically Signed   By: Franchot Gallo M.D.   On: 09/16/2021 14:32   CT CERVICAL SPINE WO CONTRAST  Result Date: 09/16/2021 CLINICAL DATA:  Fall at home.  Head trauma. EXAM: CT HEAD WITHOUT CONTRAST CT CERVICAL SPINE WITHOUT CONTRAST TECHNIQUE: Multidetector CT imaging of the head and cervical spine was performed following the standard protocol without intravenous contrast. Multiplanar CT image reconstructions of the cervical spine were also generated. COMPARISON:  01/21/2021 FINDINGS: CT HEAD FINDINGS Brain: No evidence of acute infarction, hemorrhage, hydrocephalus, extra-axial collection or mass lesion/mass effect. Mild atrophy and mild chronic white matter changes. Vascular: Negative for hyperdense vessel. Mild calcification right M1 segment. Skull: Negative Sinuses/Orbits: Mild mucosal edema paranasal sinuses. Bilateral cataract extraction hospital Other: None CT CERVICAL SPINE FINDINGS Alignment: Mild anterolisthesis C3-4 Skull base and vertebrae: Negative for fracture Soft tissues and spinal canal: Negative for soft tissue swelling or mass. Disc levels: Disc degeneration and spurring C4-5, C5-6, C6-7. Mild foraminal narrowing bilaterally C5-6  and C6-7. Upper chest: Lung apices  clear bilaterally Other: None IMPRESSION: 1. No acute intracranial abnormality 2. Negative for cervical spine fracture. Electronically Signed   By: Franchot Gallo M.D.   On: 09/16/2021 14:32   DG C-Arm 1-60 Min-No Report  Result Date: 09/17/2021 CLINICAL DATA:  Surgery: INTRAMEDULLARY (IM) NAIL INTERTROCHANTRIC EXAM: LEFT FEMUR 2 VIEWS; DG C-ARM 1-60 MIN-NO REPORT COMPARISON:  None. FINDINGS: Intraoperative intramedullary nail fixation of a left intertrochanteric femoral fracture. 8 low resolution intraoperative spot views of the left femur were obtained. No fracture visible on the limited views. Total fluoroscopy time: 2 minutes and 35 seconds Total radiation dose: 20 mGy IMPRESSION: Intraoperative intramedullary nail fixation of a left intertrochanteric femoral fracture. Electronically Signed   By: Iven Finn M.D.   On: 09/17/2021 19:17   DG C-Arm 1-60 Min-No Report  Result Date: 09/17/2021 CLINICAL DATA:  Surgery: INTRAMEDULLARY (IM) NAIL INTERTROCHANTRIC EXAM: LEFT FEMUR 2 VIEWS; DG C-ARM 1-60 MIN-NO REPORT COMPARISON:  None. FINDINGS: Intraoperative intramedullary nail fixation of a left intertrochanteric femoral fracture. 8 low resolution intraoperative spot views of the left femur were obtained. No fracture visible on the limited views. Total fluoroscopy time: 2 minutes and 35 seconds Total radiation dose: 20 mGy IMPRESSION: Intraoperative intramedullary nail fixation of a left intertrochanteric femoral fracture. Electronically Signed   By: Iven Finn M.D.   On: 09/17/2021 19:17   ECHOCARDIOGRAM COMPLETE  Result Date: 09/17/2021    ECHOCARDIOGRAM REPORT   Patient Name:   Patrick Turner Date of Exam: 09/17/2021 Medical Rec #:  [id number]        Height:       65.0 in Accession #:    [id number]       Weight:       184.0 lb Date of Birth:  05-Feb-1946        BSA:          1.909 m Patient Age:    79 years         BP:           173/59 mmHg Patient Gender: M                HR:           75 bpm. Exam  Location:  Inpatient Procedure: 2D Echo, Cardiac Doppler and Color Doppler Indications:    R55 Syncope  History:        Patient has prior history of Echocardiogram examinations, most                 recent 02/23/2018. Signs/Symptoms:Chest Pain; Risk                 Factors:Diabetes and Hypertension.  Sonographer:    Glo Herring Referring Phys: [place]  1. Left ventricular ejection fraction, by estimation, is 60 to 65%. The left ventricle has normal function. The left ventricle has no regional wall motion abnormalities. Left ventricular diastolic parameters are consistent with Grade I diastolic dysfunction (impaired relaxation).  2. Right ventricular systolic function is normal. The right ventricular size is normal.  3. The mitral valve is abnormal. No evidence of mitral valve regurgitation. No evidence of mitral stenosis.  4. Cannot tell if valve is tri leaflet Calcified with peak gradient 5 mmHg but AVA 2.4 cm2. The aortic valve was not well visualized. There is moderate thickening of the aortic valve. Aortic valve regurgitation is trivial. Aortic valve sclerosis/calcification is present, without any evidence of aortic stenosis.  5. The inferior vena cava is normal in size with greater than 50% respiratory variability, suggesting right atrial pressure of 3 mmHg. FINDINGS  Left Ventricle: Left ventricular ejection fraction, by estimation, is 60 to 65%. The left ventricle has normal function. The left ventricle has no regional wall motion abnormalities. The left ventricular internal cavity size was normal in size. There is  no left ventricular hypertrophy. Left ventricular diastolic parameters are consistent with Grade I diastolic dysfunction (impaired relaxation). Right Ventricle: The right ventricular size is normal. No increase in right ventricular wall thickness. Right ventricular systolic function is normal. Left Atrium: Left atrial size was normal in size. Right Atrium: Right atrial  size was normal in size. Pericardium: There is no evidence of pericardial effusion. Mitral Valve: The mitral valve is abnormal. There is mild thickening of the mitral valve leaflet(s). There is mild calcification of the mitral valve leaflet(s). No evidence of mitral valve regurgitation. No evidence of mitral valve stenosis. Tricuspid Valve: The tricuspid valve is normal in structure. Tricuspid valve regurgitation is not demonstrated. No evidence of tricuspid stenosis. Aortic Valve: Cannot tell if valve is tri leaflet Calcified with peak gradient 5 mmHg but AVA 2.4 cm2. The aortic valve was not well visualized. There is moderate thickening of the aortic valve. There is moderate aortic valve annular calcification. Aortic valve regurgitation is trivial. Aortic valve sclerosis/calcification is present, without any evidence of aortic stenosis. Aortic valve mean gradient measures 5.5 mmHg. Aortic valve peak gradient measures 13.3 mmHg. Aortic valve area, by VTI measures 2.36 cm. Pulmonic Valve: The pulmonic valve was normal in structure. Pulmonic valve regurgitation is not visualized. No evidence of pulmonic stenosis. Aorta: The aortic root is normal in size and structure. Venous: The inferior vena cava is normal in size with greater than 50% respiratory variability, suggesting right atrial pressure of 3 mmHg. IAS/Shunts: No atrial level shunt detected by color flow Doppler.  LEFT VENTRICLE PLAX 2D LVIDd:         4.10 cm   Diastology LVIDs:         2.90 cm   LV e' medial:    7.40 cm/s LV PW:         1.10 cm   LV E/e' medial:  12.0 LV IVS:        1.10 cm   LV e' lateral:   9.90 cm/s LVOT diam:     2.10 cm   LV E/e' lateral: 8.9 LV SV:         84 LV SV Index:   44 LVOT Area:     3.46 cm  RIGHT VENTRICLE             IVC RV Basal diam:  3.30 cm     IVC diam: 1.20 cm RV S prime:     11.00 cm/s LEFT ATRIUM             Index        RIGHT ATRIUM           Index LA diam:        3.30 cm 1.73 cm/m   RA Area:     14.20 cm LA Vol  (A2C):   81.3 ml 42.58 ml/m  RA Volume:   32.40 ml  16.97 ml/m LA Vol (A4C):   42.5 ml 22.26 ml/m LA Biplane Vol: 63.3 ml 33.15 ml/m  AORTIC VALVE AV Area (Vmax):    2.14 cm AV Area (Vmean):   2.37 cm AV Area (VTI):  2.36 cm AV Vmax:           182.50 cm/s AV Vmean:          105.000 cm/s AV VTI:            0.355 m AV Peak Grad:      13.3 mmHg AV Mean Grad:      5.5 mmHg LVOT Vmax:         113.00 cm/s LVOT Vmean:        71.800 cm/s LVOT VTI:          0.242 m LVOT/AV VTI ratio: 0.68  AORTA Ao Root diam: 3.70 cm Ao Asc diam:  3.60 cm MITRAL VALVE MV Area (PHT): 3.20 cm     SHUNTS MV Decel Time: 237 msec     Systemic VTI:  0.24 m MV E velocity: 88.60 cm/s   Systemic Diam: 2.10 cm MV A velocity: 117.00 cm/s MV E/A ratio:  0.76 Jenkins Rouge MD Electronically signed by Jenkins Rouge MD Signature Date/Time: 09/17/2021/5:37:05 PM    Final    DG Hip Unilat With Pelvis 2-3 Views Left  Result Date: 09/16/2021 CLINICAL DATA:  Fall.  Left hip pain. EXAM: DG HIP (WITH OR WITHOUT PELVIS) 2-3V LEFT COMPARISON:  01/25/2019 FINDINGS: There is diffuse decreased bone mineralization. New acute comminuted fracture of the left proximal femoral intertrochanteric region with mild-to-moderate impaction and mild varus angulation. Mild bilateral femoroacetabular joint space narrowing. Moderate right L4-5 disc space narrowing. IMPRESSION: Acute, comminuted left intertrochanteric fracture. Electronically Signed   By: Yvonne Kendall   On: 09/16/2021 14:04   DG FEMUR MIN 2 VIEWS LEFT  Result Date: 09/17/2021 CLINICAL DATA:  Surgery: INTRAMEDULLARY (IM) NAIL INTERTROCHANTRIC EXAM: LEFT FEMUR 2 VIEWS; DG C-ARM 1-60 MIN-NO REPORT COMPARISON:  None. FINDINGS: Intraoperative intramedullary nail fixation of a left intertrochanteric femoral fracture. 8 low resolution intraoperative spot views of the left femur were obtained. No fracture visible on the limited views. Total fluoroscopy time: 2 minutes and 35 seconds Total radiation dose: 20 mGy  IMPRESSION: Intraoperative intramedullary nail fixation of a left intertrochanteric femoral fracture. Electronically Signed   By: Iven Finn M.D.   On: 09/17/2021 19:17   DG FEMUR PORT MIN 2 VIEWS LEFT  Result Date: 09/17/2021 CLINICAL DATA:  ORIF left femur EXAM: LEFT FEMUR PORTABLE 2 VIEWS COMPARISON:  09/16/2021 FINDINGS: Frontal and lateral views of the left femur are obtained. Intramedullary rod with proximal dynamic and distal interlocking screws are identified traversing the intertrochanteric fracture seen previously. Alignment is anatomic. Postsurgical changes in the overlying soft tissues. IMPRESSION: 1. ORIF intertrochanteric left hip fracture with anatomic alignment. Electronically Signed   By: Randa Ngo M.D.   On: 09/17/2021 20:16     Results/Tests Pending at Time of Discharge: none  Discharge Medications:  Allergies as of 09/22/2021       Reactions   Penicillins Anaphylaxis   Has patient had a PCN reaction causing immediate rash, facial/tongue/throat swelling, SOB or lightheadedness with hypotension: Yes Has patient had a PCN reaction causing severe rash involving mucus membranes or skin necrosis: No Has patient had a PCN reaction that required hospitalization Yes Has patient had a PCN reaction occurring within the last 10 years: No If all of the above answers are "NO", then may proceed with Cephalosporin use.   Tavist [clemastine] Swelling        Medication List     STOP taking these medications    clindamycin 300 MG capsule Commonly known as: CLEOCIN  clonazePAM 1 MG tablet Commonly known as: KLONOPIN   clopidogrel 75 MG tablet Commonly known as: Plavix   gabapentin 400 MG capsule Commonly known as: NEURONTIN   losartan 25 MG tablet Commonly known as: COZAAR   metoprolol tartrate 25 MG tablet Commonly known as: LOPRESSOR   pioglitazone 15 MG tablet Commonly known as: ACTOS   Vitamin D (Ergocalciferol) 1.25 MG (50000 UNIT) Caps capsule Commonly  known as: DRISDOL       TAKE these medications    Accu-Chek Aviva Plus test strip Generic drug: glucose blood Use to test blood sugar twice daily   Accu-Chek Softclix Lancets lancets Use to test blood sugar twice daily   acetaminophen 325 MG tablet Commonly known as: TYLENOL Take 2 tablets (650 mg total) by mouth every 6 (six) hours as needed for mild pain (or Fever >/= 101).   ascorbic acid 500 MG tablet Commonly known as: VITAMIN C Take 500 mg by mouth daily.   aspirin 81 MG tablet Take 1 tablet (81 mg total) by mouth daily.   atorvastatin 80 MG tablet Commonly known as: LIPITOR Take 80 mg by mouth at bedtime.   carvedilol 6.25 MG tablet Commonly known as: COREG Take 6.25 mg by mouth 2 (two) times daily with a meal.   Cholecalciferol 50 MCG (2000 UT) Tabs Take 2,000 Units by mouth daily.   donepezil 5 MG tablet Commonly known as: ARICEPT Take 1 tablet (5 mg total) by mouth at bedtime.   enoxaparin 30 MG/0.3ML injection Commonly known as: LOVENOX Inject 0.3 mLs (30 mg total) into the skin every 12 (twelve) hours for 24 days.   Fish Oil 1000 MG Caps Take 1,000 mg by mouth 2 (two) times daily.   insulin detemir 100 UNIT/ML injection Commonly known as: LEVEMIR Inject 15 Units into the skin at bedtime.   iron polysaccharides 150 MG capsule Commonly known as: NIFEREX Take 150 mg by mouth daily.   metFORMIN 500 MG 24 hr tablet Commonly known as: GLUCOPHAGE-XR Take 500 mg by mouth 2 (two) times daily with a meal. 2 tablets twice daily with meals   Ozempic (0.25 or 0.5 MG/DOSE) 2 MG/1.5ML Sopn Generic drug: Semaglutide(0.25 or 0.5MG /DOS) Inject 1 Dose into the skin once a week. Wednesdays   pantoprazole 40 MG tablet Commonly known as: PROTONIX Take 40 mg by mouth daily.   PARoxetine 40 MG tablet Commonly known as: PAXIL Take 40 mg by mouth daily.   ranolazine 500 MG 12 hr tablet Commonly known as: RANEXA Take 500 mg by mouth 2 (two) times daily.    vitamin B-12 1000 MCG tablet Commonly known as: CYANOCOBALAMIN Take 1,000 mcg by mouth daily.        Discharge Instructions: Please refer to Patient Instructions section of EMR for full details.  Patient was counseled important signs and symptoms that should prompt return to medical care, changes in medications, dietary instructions, activity restrictions, and follow up appointments.   Follow-Up Appointments:  Follow-up Information     [facility] Follow up.   Specialty: Home Health Services Contact information: [address] [place] [address] [phone]                 Alen Bleacher, MD 09/22/2021, 2:21 PM PGY-1, [place]

## 2021-09-22 NOTE — Care Management (Signed)
Received home health orders . Patient already as left . Cory with [facility] updated.

## 2021-09-22 NOTE — Progress Notes (Signed)
Physical Therapy Treatment Patient Details Name: Patrick Turner MRN: [id number] DOB: Jun 16, 1946 Today's Date: 09/22/2021   History of Present Illness 76 yo male with onset of fall of unclear nature was admitted 1/5 for ORIF to L intertrochanteric femur fracture.  Pt was alternately orthostatic and hypertensive upon admission, now more normotensive.  Pt is confused at baseline but very limited ability to follow instructions now.  PMHx:  DM, eczema, OSA, GERD, depression, CKD3a, HLD, SMA stenosis, CAD    PT Comments    Pt tolerates treatment well, ambulating for increased distances and progressing to stair training. Pt requires reduced assistance during all phases of mobility and denies significant pain at this time. Pt will benefit from continued aggressive mobilization to aide in a return to independence. PT updates recommendations to home health PT with use of a RW and assistance of the patient's spouse.   Recommendations for follow up therapy are one component of a multi-disciplinary discharge planning process, led by the attending physician.  Recommendations may be updated based on patient status, additional functional criteria and insurance authorization.  Follow Up Recommendations  Home health PT     Assistance Recommended at Discharge Intermittent Supervision/Assistance  Patient can return home with the following A little help with walking and/or transfers;A little help with bathing/dressing/bathroom;Assistance with cooking/housework;Assist for transportation;Help with stairs or ramp for entrance   Equipment Recommendations  Rolling walker (2 wheels)    Recommendations for Other Services       Precautions / Restrictions Precautions Precautions: Fall;Other (comment) Precaution Comments: HOH Restrictions Weight Bearing Restrictions: Yes LLE Weight Bearing: Weight bearing as tolerated     Mobility  Bed Mobility Overal bed mobility: Needs Assistance Bed Mobility: Supine to  Sit     Supine to sit: Supervision     General bed mobility comments: increased time    Transfers Overall transfer level: Needs assistance Equipment used: Rolling walker (2 wheels) Transfers: Sit to/from Stand Sit to Stand: Min guard                Ambulation/Gait Ambulation/Gait assistance: Min guard Gait Distance (Feet): 150 Feet Assistive device: Rolling walker (2 wheels) Gait Pattern/deviations: Step-through pattern Gait velocity: reduced Gait velocity interpretation: <1.8 ft/sec, indicate of risk for recurrent falls   General Gait Details: pt with slowed step-through gait, reduced stance time on LLE   Stairs Stairs: Yes Stairs assistance: Min guard Stair Management: One rail Right;Sideways;Step to pattern Number of Stairs: 4     Wheelchair Mobility    Modified Rankin (Stroke Patients Only)       Balance Overall balance assessment: Needs assistance Sitting-balance support: No upper extremity supported;Feet supported Sitting balance-Leahy Scale: Good     Standing balance support: No upper extremity supported;During functional activity Standing balance-Leahy Scale: Fair Standing balance comment: dons mask in standing                            Cognition Arousal/Alertness: Awake/alert Behavior During Therapy: WFL for tasks assessed/performed Overall Cognitive Status: No family/caregiver present to determine baseline cognitive functioning                                 General Comments: pt follows commands appropriately. Cognition appears [facility] when pt hears therapist well.        Exercises      General Comments General comments (skin integrity, edema, etc.): VSS on  RA      Pertinent Vitals/Pain Pain Assessment: No/denies pain    Home Living                          Prior Function            PT Goals (current goals can now be found in the care plan section) Acute Rehab PT Goals Patient Stated  Goal: to go home today Progress towards PT goals: Progressing toward goals    Frequency    Min 3X/week      PT Plan Current plan remains appropriate    Co-evaluation              AM-PAC PT "6 Clicks" Mobility   Outcome Measure  Help needed turning from your back to your side while in a flat bed without using bedrails?: A Little Help needed moving from lying on your back to sitting on the side of a flat bed without using bedrails?: A Little Help needed moving to and from a bed to a chair (including a wheelchair)?: A Little Help needed standing up from a chair using your arms (e.g., wheelchair or bedside chair)?: A Little Help needed to walk in hospital room?: A Little Help needed climbing 3-5 steps with a railing? : A Little 6 Click Score: 18    End of Session   Activity Tolerance: Patient tolerated treatment well Patient left: in chair;with call bell/phone within reach;with chair alarm set Nurse Communication: Mobility status PT Visit Diagnosis: Muscle weakness (generalized) (M62.81);Pain;History of falling (Z91.81) Pain - Right/Left: Left Pain - part of body: Hip     Time: 0539-7673 PT Time Calculation (min) (ACUTE ONLY): 23 min  Charges:  $Gait Training: 23-37 mins                     Zenaida Niece, PT, DPT Acute Rehabilitation Pager: [phone] Office Walker 09/22/2021, 9:25 AM

## 2021-09-22 NOTE — Evaluation (Signed)
Occupational Therapy Evaluation Patient Details Name: Patrick Turner MRN: [id number] DOB: November 28, 1945 Today's Date: 09/22/2021   History of Present Illness 76 yo male with onset of fall of unclear nature was admitted 1/5 for ORIF to L intertrochanteric femur fracture.  Pt was alternately orthostatic and hypertensive upon admission, now more normotensive.  Pt is confused at baseline but very limited ability to follow instructions now.  PMHx:  DM, eczema, OSA, GERD, depression, CKD3a, HLD, SMA stenosis, CAD   Clinical Impression   OT eval initiated and completed. Pt is at min A level with LB selfcare and min guard A with mobility using RW. Pt lives at home with his wife and was Ind with ADLs, driving and working PTA.  will have 24/7 assist at home from his wife. Pt eager to d/c home later this afternoon. All education completed and no further acute OT services are indicated at this time, will defer further OT intervention to [facility] services.     Recommendations for follow up therapy are one component of a multi-disciplinary discharge planning process, led by the attending physician.  Recommendations may be updated based on patient status, additional functional criteria and insurance authorization.   Follow Up Recommendations  Home health OT    Assistance Recommended at Discharge Intermittent Supervision/Assistance  Patient can return home with the following A little help with walking and/or transfers;A little help with bathing/dressing/bathroom;Assist for transportation    Functional Status Assessment  Patient has had a recent decline in their functional status and demonstrates the ability to make significant improvements in function in a reasonable and predictable amount of time.  Equipment Recommendations   (RW)    Recommendations for Other Services       Precautions / Restrictions Precautions Precautions: Fall;Other (comment) Precaution Comments: HOH Restrictions Weight Bearing  Restrictions: Yes LLE Weight Bearing: Weight bearing as tolerated      Mobility Bed Mobility               General bed mobility comments: pt in recliner upon arrival    Transfers Overall transfer level: Needs assistance Equipment used: Rolling walker (2 wheels) Transfers: Sit to/from Stand Sit to Stand: Min guard           General transfer comment: verbal cues for hand placement, mod A to power up, denies L LE increased pain with WBing      Balance Overall balance assessment: Needs assistance Sitting-balance support: No upper extremity supported;Feet supported Sitting balance-Leahy Scale: Good     Standing balance support: No upper extremity supported;During functional activity Standing balance-Leahy Scale: Fair                             ADL either performed or assessed with clinical judgement   ADL Overall ADL's : Needs assistance/impaired Eating/Feeding: Sitting;Independent   Grooming: Wash/dry hands;Wash/dry face;Supervision/safety;Standing   Upper Body Bathing: Set up;Sitting   Lower Body Bathing: Minimal assistance   Upper Body Dressing : Set up;Sitting   Lower Body Dressing: Minimal assistance   Toilet Transfer: Min guard;Ambulation;Rolling walker (2 wheels);Cueing for safety;BSC/3in1   Toileting- Water quality scientist and Hygiene: Min guard;Sit to/from stand       Functional mobility during ADLs: Min guard;Rolling walker (2 wheels);Cueing for safety       Vision Baseline Vision/History: 1 Wears glasses Ability to See in Adequate Light: 0 Adequate Patient Visual Report: No change from baseline       Perception  Praxis      Pertinent Vitals/Pain Pain Assessment: Faces Faces Pain Scale: Hurts a little bit Pain Location: L hip with mobiltiy and WBing, reports "it's really not that bad" Pain Descriptors / Indicators: Discomfort;Aching Pain Intervention(s): Monitored during session;Repositioned     Hand Dominance  Right   Extremity/Trunk Assessment Upper Extremity Assessment Upper Extremity Assessment: Generalized weakness   Lower Extremity Assessment Lower Extremity Assessment: Defer to PT evaluation   Cervical / Trunk Assessment Cervical / Trunk Assessment: Kyphotic   Communication Communication Communication: HOH (has hearing aids but not wearing them)   Cognition                                             General Comments       Exercises     Shoulder Instructions      Home Living Family/patient expects to be discharged to:: Private residence Living Arrangements: Spouse/significant other Available Help at Discharge: Family;Available 24 hours/day Type of Home: House Home Access: Stairs to enter [employer] of Steps: 3 Entrance Stairs-Rails: None Home Layout: Two level;Able to live on main level with bedroom/bathroom;Full bath on main level     Bathroom Shower/Tub: Teacher, early years/pre: Standard     Home Equipment: Conservation officer, nature (2 wheels);Cane - single point;Shower seat   Additional Comments: pt cannot give PT any history on his abiltiy to walk or layout of home prior to fall.      Prior Functioning/Environment Prior Level of Function : Independent/Modified Independent             Mobility Comments: pt is unable to give PLOF information          OT Problem List: Impaired balance (sitting and/or standing);Decreased strength;Decreased activity tolerance;Decreased knowledge of use of DME or AE;Pain      OT Treatment/Interventions: Self-care/ADL training    OT Goals(Current goals can be found in the care plan section) Acute Rehab OT Goals Patient Stated Goal: go home OT Goal Formulation: With patient  OT Frequency:      Co-evaluation              AM-PAC OT "6 Clicks" Daily Activity     Outcome Measure Help from another person eating meals?: None Help from another person taking care of personal grooming?: A  Little Help from another person toileting, which includes using toliet, bedpan, or urinal?: A Little Help from another person bathing (including washing, rinsing, drying)?: A Little Help from another person to put on and taking off regular upper body clothing?: None Help from another person to put on and taking off regular lower body clothing?: A Little 6 Click Score: 20   End of Session Equipment Utilized During Treatment: Gait belt;Rolling walker (2 wheels)  Activity Tolerance: Patient tolerated treatment well Patient left: in chair;with call bell/phone within reach;with chair alarm set  OT Visit Diagnosis: Other abnormalities of gait and mobility (R26.89);Unsteadiness on feet (R26.81);History of falling (Z91.81);Muscle weakness (generalized) (M62.81)                Time: 7591-6384 OT Time Calculation (min): 24 min Charges:  OT General Charges $OT Visit: 1 Visit OT Evaluation $OT Eval Moderate Complexity: 1 Mod OT Treatments $Self Care/Home Management : 8-22 mins    Britt Bottom 09/22/2021, 1:54 PM

## 2021-09-22 NOTE — Progress Notes (Signed)
Discharge instructions given with pt son at bedside. Reviewed lovenox administration with both pt and son via demonstration. Both verbalized understanding. Instructed pt to pick up prescriptions from preferred pharmacy. IV removed and pt transported off unit via [place] with all belongings on self. Pt remained stable at baseline.

## 2021-09-23 ENCOUNTER — Other Ambulatory Visit: Payer: Self-pay

## 2021-09-23 NOTE — Patient Outreach (Signed)
[facility]) Care Management  09/23/2021  [name] 07-23-1946 [id number]   Telephone Assessment Transition of Care     RN CM received notification from [facility] hospital liaison that patient discharged home on 09/22/2021 following admission for left hip fx. Outreach attempt #1 to patient. A male answered the phone and identified himself as patient's son. States he has patient's phone but currently not with patient at present and handling some business.        Plan: RN CM will make outreach attempt within 4 business days to patient.   Enzo Montgomery, RN,BSN,CCM [place] [facility] Telephonic Care Management Coordinator Direct Phone: [phone] Toll Free: [phone] Fax: [phone]

## 2021-09-24 ENCOUNTER — Other Ambulatory Visit: Payer: Self-pay

## 2021-09-24 NOTE — Patient Outreach (Signed)
[facility]) Care Management  09/24/2021  LINLEY MOXLEY 06-05-1946 [id number]   Telephone Assessment Transition of Care     Unsuccessful outreach attempt #2 to patient.      Plan: Assigned RN CM will make outreach attempt to patient within the month of Feb.  Traylen Eckels Ver[place] Blalock [place] Management Telephonic Care Management Coordinator Direct Phone: [phone] Toll Free: [phone] Fax: [phone]

## 2021-10-01 DIAGNOSIS — S72142D Displaced intertrochanteric fracture of left femur, subsequent encounter for closed fracture with routine healing: Secondary | ICD-10-CM | POA: Diagnosis not present

## 2021-10-12 DIAGNOSIS — R809 Proteinuria, unspecified: Secondary | ICD-10-CM | POA: Diagnosis not present

## 2021-10-12 DIAGNOSIS — E1122 Type 2 diabetes mellitus with diabetic chronic kidney disease: Secondary | ICD-10-CM | POA: Diagnosis not present

## 2021-10-12 DIAGNOSIS — N183 Chronic kidney disease, stage 3 unspecified: Secondary | ICD-10-CM | POA: Diagnosis not present

## 2021-10-12 DIAGNOSIS — I129 Hypertensive chronic kidney disease with stage 1 through stage 4 chronic kidney disease, or unspecified chronic kidney disease: Secondary | ICD-10-CM | POA: Diagnosis not present

## 2021-10-12 DIAGNOSIS — G20 Parkinson's disease: Secondary | ICD-10-CM | POA: Diagnosis not present

## 2021-10-12 DIAGNOSIS — F325 Major depressive disorder, single episode, in full remission: Secondary | ICD-10-CM | POA: Diagnosis not present

## 2021-10-12 DIAGNOSIS — I700 Atherosclerosis of aorta: Secondary | ICD-10-CM | POA: Diagnosis not present

## 2021-10-12 DIAGNOSIS — E1140 Type 2 diabetes mellitus with diabetic neuropathy, unspecified: Secondary | ICD-10-CM | POA: Diagnosis not present

## 2021-10-12 DIAGNOSIS — I10 Essential (primary) hypertension: Secondary | ICD-10-CM | POA: Diagnosis not present

## 2021-10-12 DIAGNOSIS — I25118 Atherosclerotic heart disease of native coronary artery with other forms of angina pectoris: Secondary | ICD-10-CM | POA: Diagnosis not present

## 2021-10-12 DIAGNOSIS — N1831 Chronic kidney disease, stage 3a: Secondary | ICD-10-CM | POA: Diagnosis not present

## 2021-10-12 DIAGNOSIS — E875 Hyperkalemia: Secondary | ICD-10-CM | POA: Diagnosis not present

## 2021-10-12 DIAGNOSIS — I779 Disorder of arteries and arterioles, unspecified: Secondary | ICD-10-CM | POA: Diagnosis not present

## 2021-10-12 DIAGNOSIS — D631 Anemia in chronic kidney disease: Secondary | ICD-10-CM | POA: Diagnosis not present

## 2021-10-12 DIAGNOSIS — E1169 Type 2 diabetes mellitus with other specified complication: Secondary | ICD-10-CM | POA: Diagnosis not present

## 2021-10-12 DIAGNOSIS — E1142 Type 2 diabetes mellitus with diabetic polyneuropathy: Secondary | ICD-10-CM | POA: Diagnosis not present

## 2021-10-18 ENCOUNTER — Other Ambulatory Visit: Payer: Self-pay

## 2021-10-18 NOTE — Patient Outreach (Signed)
[facility]) Care Management  10/18/2021  WM SAHAGUN 12/28/1945 [id number]   Telephone Assessment    Unsuccessful outreach attempt to patient.    Plan: Assigned RN CM will make outreach attempt to patient within the month of April.  Enzo Montgomery, RN,BSN,CCM [place] [facility] Telephonic Care Management Coordinator Direct Phone: [phone] Toll Free: [phone] Fax: [phone]

## 2021-10-19 DIAGNOSIS — S72142D Displaced intertrochanteric fracture of left femur, subsequent encounter for closed fracture with routine healing: Secondary | ICD-10-CM | POA: Diagnosis not present

## 2021-10-20 ENCOUNTER — Ambulatory Visit: Payer: Self-pay

## 2021-10-26 DIAGNOSIS — F321 Major depressive disorder, single episode, moderate: Secondary | ICD-10-CM | POA: Diagnosis not present

## 2021-10-26 DIAGNOSIS — S72142D Displaced intertrochanteric fracture of left femur, subsequent encounter for closed fracture with routine healing: Secondary | ICD-10-CM | POA: Diagnosis not present

## 2021-10-26 DIAGNOSIS — F332 Major depressive disorder, recurrent severe without psychotic features: Secondary | ICD-10-CM | POA: Diagnosis not present

## 2021-10-26 DIAGNOSIS — F03918 Unspecified dementia, unspecified severity, with other behavioral disturbance: Secondary | ICD-10-CM | POA: Diagnosis not present

## 2021-10-27 DIAGNOSIS — N183 Chronic kidney disease, stage 3 unspecified: Secondary | ICD-10-CM | POA: Diagnosis not present

## 2021-10-27 DIAGNOSIS — Z8781 Personal history of (healed) traumatic fracture: Secondary | ICD-10-CM | POA: Diagnosis not present

## 2021-10-27 DIAGNOSIS — E1159 Type 2 diabetes mellitus with other circulatory complications: Secondary | ICD-10-CM | POA: Diagnosis not present

## 2021-10-27 DIAGNOSIS — E1142 Type 2 diabetes mellitus with diabetic polyneuropathy: Secondary | ICD-10-CM | POA: Diagnosis not present

## 2021-10-27 DIAGNOSIS — I10 Essential (primary) hypertension: Secondary | ICD-10-CM | POA: Diagnosis not present

## 2021-10-27 DIAGNOSIS — M818 Other osteoporosis without current pathological fracture: Secondary | ICD-10-CM | POA: Diagnosis not present

## 2021-10-27 DIAGNOSIS — E1122 Type 2 diabetes mellitus with diabetic chronic kidney disease: Secondary | ICD-10-CM | POA: Diagnosis not present

## 2021-10-27 DIAGNOSIS — E113393 Type 2 diabetes mellitus with moderate nonproliferative diabetic retinopathy without macular edema, bilateral: Secondary | ICD-10-CM | POA: Diagnosis not present

## 2021-10-27 DIAGNOSIS — Z794 Long term (current) use of insulin: Secondary | ICD-10-CM | POA: Diagnosis not present

## 2021-10-29 DIAGNOSIS — E113312 Type 2 diabetes mellitus with moderate nonproliferative diabetic retinopathy with macular edema, left eye: Secondary | ICD-10-CM | POA: Diagnosis not present

## 2021-11-01 DIAGNOSIS — I10 Essential (primary) hypertension: Secondary | ICD-10-CM | POA: Diagnosis not present

## 2021-11-01 DIAGNOSIS — I2510 Atherosclerotic heart disease of native coronary artery without angina pectoris: Secondary | ICD-10-CM | POA: Diagnosis not present

## 2021-11-01 DIAGNOSIS — R0602 Shortness of breath: Secondary | ICD-10-CM | POA: Diagnosis not present

## 2021-11-01 DIAGNOSIS — E782 Mixed hyperlipidemia: Secondary | ICD-10-CM | POA: Diagnosis not present

## 2021-11-04 ENCOUNTER — Observation Stay
Admission: EM | Admit: 2021-11-04 | Discharge: 2021-11-06 | Disposition: A | Discharge status: Home Health | Payer: Medicare HMO | Attending: Family Medicine | Admitting: Family Medicine

## 2021-11-04 ENCOUNTER — Emergency Department: Payer: Medicare HMO

## 2021-11-04 ENCOUNTER — Encounter: Payer: Self-pay | Admitting: Emergency Medicine

## 2021-11-04 ENCOUNTER — Other Ambulatory Visit: Payer: Self-pay

## 2021-11-04 DIAGNOSIS — N1831 Chronic kidney disease, stage 3a: Secondary | ICD-10-CM

## 2021-11-04 DIAGNOSIS — Z96651 Presence of right artificial knee joint: Secondary | ICD-10-CM | POA: Diagnosis not present

## 2021-11-04 DIAGNOSIS — R531 Weakness: Secondary | ICD-10-CM | POA: Diagnosis present

## 2021-11-04 DIAGNOSIS — Z20822 Contact with and (suspected) exposure to covid-19: Secondary | ICD-10-CM | POA: Insufficient documentation

## 2021-11-04 DIAGNOSIS — I10 Essential (primary) hypertension: Secondary | ICD-10-CM | POA: Diagnosis not present

## 2021-11-04 DIAGNOSIS — S066X0A Traumatic subarachnoid hemorrhage without loss of consciousness, initial encounter: Secondary | ICD-10-CM | POA: Diagnosis not present

## 2021-11-04 DIAGNOSIS — E875 Hyperkalemia: Secondary | ICD-10-CM

## 2021-11-04 DIAGNOSIS — F418 Other specified anxiety disorders: Secondary | ICD-10-CM | POA: Diagnosis present

## 2021-11-04 DIAGNOSIS — E1122 Type 2 diabetes mellitus with diabetic chronic kidney disease: Secondary | ICD-10-CM | POA: Insufficient documentation

## 2021-11-04 DIAGNOSIS — Z79899 Other long term (current) drug therapy: Secondary | ICD-10-CM | POA: Insufficient documentation

## 2021-11-04 DIAGNOSIS — F03A0 Unspecified dementia, mild, without behavioral disturbance, psychotic disturbance, mood disturbance, and anxiety: Secondary | ICD-10-CM | POA: Diagnosis not present

## 2021-11-04 DIAGNOSIS — I129 Hypertensive chronic kidney disease with stage 1 through stage 4 chronic kidney disease, or unspecified chronic kidney disease: Secondary | ICD-10-CM | POA: Diagnosis not present

## 2021-11-04 DIAGNOSIS — R4182 Altered mental status, unspecified: Secondary | ICD-10-CM | POA: Diagnosis not present

## 2021-11-04 DIAGNOSIS — R5381 Other malaise: Secondary | ICD-10-CM | POA: Diagnosis not present

## 2021-11-04 DIAGNOSIS — I63233 Cerebral infarction due to unspecified occlusion or stenosis of bilateral carotid arteries: Secondary | ICD-10-CM | POA: Diagnosis not present

## 2021-11-04 DIAGNOSIS — R29818 Other symptoms and signs involving the nervous system: Secondary | ICD-10-CM | POA: Diagnosis not present

## 2021-11-04 DIAGNOSIS — I609 Nontraumatic subarachnoid hemorrhage, unspecified: Secondary | ICD-10-CM | POA: Diagnosis not present

## 2021-11-04 DIAGNOSIS — I160 Hypertensive urgency: Principal | ICD-10-CM | POA: Insufficient documentation

## 2021-11-04 DIAGNOSIS — R4789 Other speech disturbances: Secondary | ICD-10-CM | POA: Diagnosis present

## 2021-11-04 DIAGNOSIS — J3489 Other specified disorders of nose and nasal sinuses: Secondary | ICD-10-CM | POA: Diagnosis not present

## 2021-11-04 DIAGNOSIS — N1832 Chronic kidney disease, stage 3b: Secondary | ICD-10-CM | POA: Diagnosis not present

## 2021-11-04 DIAGNOSIS — E1169 Type 2 diabetes mellitus with other specified complication: Secondary | ICD-10-CM | POA: Diagnosis not present

## 2021-11-04 DIAGNOSIS — E785 Hyperlipidemia, unspecified: Secondary | ICD-10-CM

## 2021-11-04 DIAGNOSIS — Z794 Long term (current) use of insulin: Secondary | ICD-10-CM | POA: Insufficient documentation

## 2021-11-04 DIAGNOSIS — I6782 Cerebral ischemia: Secondary | ICD-10-CM | POA: Diagnosis not present

## 2021-11-04 DIAGNOSIS — Z7984 Long term (current) use of oral hypoglycemic drugs: Secondary | ICD-10-CM | POA: Diagnosis not present

## 2021-11-04 DIAGNOSIS — I2510 Atherosclerotic heart disease of native coronary artery without angina pectoris: Secondary | ICD-10-CM | POA: Diagnosis present

## 2021-11-04 DIAGNOSIS — E1129 Type 2 diabetes mellitus with other diabetic kidney complication: Secondary | ICD-10-CM | POA: Diagnosis present

## 2021-11-04 DIAGNOSIS — F29 Unspecified psychosis not due to a substance or known physiological condition: Secondary | ICD-10-CM | POA: Diagnosis not present

## 2021-11-04 LAB — COMPREHENSIVE METABOLIC PANEL
ALT: 11 U/L (ref 0–44)
AST: 15 U/L (ref 15–41)
Albumin: 3.6 g/dL (ref 3.5–5.0)
Alkaline Phosphatase: 97 U/L (ref 38–126)
Anion gap: 6 (ref 5–15)
BUN: 26 mg/dL — ABNORMAL HIGH (ref 8–23)
CO2: 24 mmol/L (ref 22–32)
Calcium: 8.7 mg/dL — ABNORMAL LOW (ref 8.9–10.3)
Chloride: 105 mmol/L (ref 98–111)
Creatinine, Ser: 1.36 mg/dL — ABNORMAL HIGH (ref 0.61–1.24)
GFR, Estimated: 54 mL/min — ABNORMAL LOW (ref >60)
Glucose, Bld: 204 mg/dL — ABNORMAL HIGH (ref 70–99)
Potassium: 5.5 mmol/L — ABNORMAL HIGH (ref 3.5–5.1)
Sodium: 135 mmol/L (ref 135–145)
Total Bilirubin: 0.5 mg/dL (ref 0.3–1.2)
Total Protein: 6.6 g/dL (ref 6.5–8.1)

## 2021-11-04 LAB — CBC WITH DIFFERENTIAL/PLATELET
Abs Immature Granulocytes: 0.03 10*3/uL (ref 0.00–0.07)
Basophils Absolute: 0 10*3/uL (ref 0.0–0.1)
Basophils Relative: 0 %
Eosinophils Absolute: 0.2 10*3/uL (ref 0.0–0.5)
Eosinophils Relative: 3 %
HCT: 35.9 % — ABNORMAL LOW (ref 39.0–52.0)
Hemoglobin: 11.3 g/dL — ABNORMAL LOW (ref 13.0–17.0)
Immature Granulocytes: 0 %
Lymphocytes Relative: 14 %
Lymphs Abs: 1 10*3/uL (ref 0.7–4.0)
MCH: 30 pg (ref 26.0–34.0)
MCHC: 31.5 g/dL (ref 30.0–36.0)
MCV: 95.2 fL (ref 80.0–100.0)
Monocytes Absolute: 0.6 10*3/uL (ref 0.1–1.0)
Monocytes Relative: 9 %
Neutro Abs: 5.3 10*3/uL (ref 1.7–7.7)
Neutrophils Relative %: 74 %
Platelets: 267 10*3/uL (ref 150–400)
RBC: 3.77 MIL/uL — ABNORMAL LOW (ref 4.22–5.81)
RDW: 14.3 % (ref 11.5–15.5)
WBC: 7.2 10*3/uL (ref 4.0–10.5)
nRBC: 0 % (ref 0.0–0.2)

## 2021-11-04 LAB — URINALYSIS, COMPLETE (UACMP) WITH MICROSCOPIC
Bacteria, UA: NONE SEEN
Bilirubin Urine: NEGATIVE
Glucose, UA: NEGATIVE mg/dL
Hgb urine dipstick: NEGATIVE
Ketones, ur: NEGATIVE mg/dL
Leukocytes,Ua: NEGATIVE
Nitrite: NEGATIVE
Protein, ur: NEGATIVE mg/dL
Specific Gravity, Urine: 1.014 (ref 1.005–1.030)
Squamous Epithelial / HPF: NONE SEEN (ref 0–5)
pH: 8 (ref 5.0–8.0)

## 2021-11-04 LAB — RESP PANEL BY RT-PCR (FLU A&B, COVID) ARPGX2
Influenza A by PCR: NEGATIVE
Influenza B by PCR: NEGATIVE
SARS Coronavirus 2 by RT PCR: NEGATIVE

## 2021-11-04 LAB — TROPONIN I (HIGH SENSITIVITY)
Troponin I (High Sensitivity): 5 ng/L (ref <18)
Troponin I (High Sensitivity): 6 ng/L (ref <18)

## 2021-11-04 LAB — CBG MONITORING, ED: Glucose-Capillary: 235 mg/dL — ABNORMAL HIGH (ref 70–99)

## 2021-11-04 MED ORDER — SODIUM ZIRCONIUM CYCLOSILICATE 10 G PO PACK
10.0000 g | PACK | Freq: Once | ORAL | Status: AC
  Administered 2021-11-04: 10 g via ORAL
  Filled 2021-11-04: qty 1

## 2021-11-04 MED ORDER — ASPIRIN EC 81 MG PO TBEC
81.0000 mg | DELAYED_RELEASE_TABLET | Freq: Every day | ORAL | Status: DC
  Administered 2021-11-04 – 2021-11-06 (×3): 81 mg via ORAL
  Filled 2021-11-04 (×3): qty 1

## 2021-11-04 MED ORDER — PANTOPRAZOLE SODIUM 40 MG PO TBEC
40.0000 mg | DELAYED_RELEASE_TABLET | Freq: Every day | ORAL | Status: DC
  Administered 2021-11-05 – 2021-11-06 (×2): 40 mg via ORAL
  Filled 2021-11-04 (×2): qty 1

## 2021-11-04 MED ORDER — INSULIN ASPART 100 UNIT/ML IJ SOLN
0.0000 [IU] | Freq: Three times a day (TID) | INTRAMUSCULAR | Status: DC
  Administered 2021-11-05: 2 [IU] via SUBCUTANEOUS
  Administered 2021-11-06 (×2): 1 [IU] via SUBCUTANEOUS
  Filled 2021-11-04 (×3): qty 1

## 2021-11-04 MED ORDER — HYDRALAZINE HCL 20 MG/ML IJ SOLN
10.0000 mg | Freq: Once | INTRAMUSCULAR | Status: AC
  Administered 2021-11-04: 10 mg via INTRAVENOUS
  Filled 2021-11-04: qty 1

## 2021-11-04 MED ORDER — SODIUM CHLORIDE 0.9 % IV BOLUS
500.0000 mL | Freq: Once | INTRAVENOUS | Status: AC
  Administered 2021-11-04: 500 mL via INTRAVENOUS

## 2021-11-04 MED ORDER — ONDANSETRON HCL 4 MG/2ML IJ SOLN
4.0000 mg | Freq: Three times a day (TID) | INTRAMUSCULAR | Status: DC | PRN
Start: 2021-11-04 — End: 2021-11-06

## 2021-11-04 MED ORDER — QUETIAPINE FUMARATE 25 MG PO TABS
100.0000 mg | ORAL_TABLET | Freq: Every day | ORAL | Status: DC
  Administered 2021-11-04 – 2021-11-05 (×2): 100 mg via ORAL
  Filled 2021-11-04 (×2): qty 4

## 2021-11-04 MED ORDER — LORAZEPAM 0.5 MG PO TABS
0.5000 mg | ORAL_TABLET | Freq: Once | ORAL | Status: AC
  Administered 2021-11-04: 0.5 mg via ORAL
  Filled 2021-11-04: qty 1

## 2021-11-04 MED ORDER — IRBESARTAN 150 MG PO TABS
150.0000 mg | ORAL_TABLET | Freq: Every day | ORAL | Status: DC
Start: 2021-11-05 — End: 2021-11-06
  Administered 2021-11-05 – 2021-11-06 (×2): 150 mg via ORAL
  Filled 2021-11-04 (×2): qty 1

## 2021-11-04 MED ORDER — VITAMIN D 25 MCG (1000 UNIT) PO TABS
2000.0000 [IU] | ORAL_TABLET | Freq: Every day | ORAL | Status: DC
  Administered 2021-11-04 – 2021-11-06 (×3): 2000 [IU] via ORAL
  Filled 2021-11-04 (×3): qty 2

## 2021-11-04 MED ORDER — DONEPEZIL HCL 5 MG PO TABS
5.0000 mg | ORAL_TABLET | Freq: Every day | ORAL | Status: DC
  Administered 2021-11-04 – 2021-11-05 (×2): 5 mg via ORAL
  Filled 2021-11-04 (×3): qty 1

## 2021-11-04 MED ORDER — HYDRALAZINE HCL 20 MG/ML IJ SOLN
5.0000 mg | Freq: Once | INTRAMUSCULAR | Status: AC
  Administered 2021-11-04: 5 mg via INTRAVENOUS
  Filled 2021-11-04: qty 1

## 2021-11-04 MED ORDER — INSULIN GLARGINE-YFGN 100 UNIT/ML ~~LOC~~ SOLN
10.0000 [IU] | Freq: Every day | SUBCUTANEOUS | Status: DC
  Administered 2021-11-05 (×2): 10 [IU] via SUBCUTANEOUS
  Filled 2021-11-04 (×5): qty 0.1

## 2021-11-04 MED ORDER — INSULIN ASPART 100 UNIT/ML IJ SOLN: 0.0000 [IU] | Freq: Every day | INTRAMUSCULAR | Status: DC

## 2021-11-04 MED ORDER — AMLODIPINE BESYLATE 10 MG PO TABS
10.0000 mg | ORAL_TABLET | Freq: Every day | ORAL | Status: DC
Start: 2021-11-04 — End: 2021-11-06
  Administered 2021-11-04 – 2021-11-06 (×3): 10 mg via ORAL
  Filled 2021-11-04 (×2): qty 2
  Filled 2021-11-04: qty 1

## 2021-11-04 MED ORDER — ACETAMINOPHEN 325 MG PO TABS
650.0000 mg | ORAL_TABLET | Freq: Four times a day (QID) | ORAL | Status: DC | PRN
Start: 2021-11-04 — End: 2021-11-06

## 2021-11-04 MED ORDER — VITAMIN B-12 1000 MCG PO TABS
1000.0000 ug | ORAL_TABLET | Freq: Every day | ORAL | Status: DC
  Administered 2021-11-05 – 2021-11-06 (×2): 1000 ug via ORAL
  Filled 2021-11-04 (×2): qty 1

## 2021-11-04 MED ORDER — IOHEXOL 350 MG/ML SOLN
75.0000 mL | Freq: Once | INTRAVENOUS | Status: AC | PRN
  Administered 2021-11-04: 75 mL via INTRAVENOUS

## 2021-11-04 MED ORDER — PAROXETINE HCL 20 MG PO TABS
40.0000 mg | ORAL_TABLET | Freq: Every day | ORAL | Status: DC
Start: 2021-11-04 — End: 2021-11-06
  Administered 2021-11-05 (×2): 40 mg via ORAL
  Filled 2021-11-04 (×4): qty 2

## 2021-11-04 MED ORDER — ATORVASTATIN CALCIUM 80 MG PO TABS
80.0000 mg | ORAL_TABLET | Freq: Every day | ORAL | Status: DC
Start: 2021-11-04 — End: 2021-11-06
  Administered 2021-11-04 – 2021-11-06 (×3): 80 mg via ORAL
  Filled 2021-11-04 (×2): qty 4
  Filled 2021-11-04: qty 1

## 2021-11-04 MED ORDER — HYDRALAZINE HCL 20 MG/ML IJ SOLN: 10.0000 mg | INTRAMUSCULAR | Status: DC | PRN

## 2021-11-04 MED ORDER — ENOXAPARIN SODIUM 40 MG/0.4ML IJ SOSY
40.0000 mg | PREFILLED_SYRINGE | INTRAMUSCULAR | Status: DC
  Administered 2021-11-04 – 2021-11-05 (×2): 40 mg via SUBCUTANEOUS
  Filled 2021-11-04 (×2): qty 0.4

## 2021-11-04 MED ORDER — GABAPENTIN 400 MG PO CAPS
400.0000 mg | ORAL_CAPSULE | Freq: Two times a day (BID) | ORAL | Status: DC
  Administered 2021-11-04 – 2021-11-06 (×4): 400 mg via ORAL
  Filled 2021-11-04 (×4): qty 1

## 2021-11-04 MED ORDER — OMEGA-3-ACID ETHYL ESTERS 1 G PO CAPS
1.0000 g | ORAL_CAPSULE | Freq: Every day | ORAL | Status: DC
  Administered 2021-11-05 – 2021-11-06 (×2): 1 g via ORAL
  Filled 2021-11-04 (×2): qty 1

## 2021-11-04 MED ORDER — ASCORBIC ACID 500 MG PO TABS
500.0000 mg | ORAL_TABLET | Freq: Every day | ORAL | Status: DC
  Administered 2021-11-04 – 2021-11-06 (×3): 500 mg via ORAL
  Filled 2021-11-04 (×3): qty 1

## 2021-11-04 MED ORDER — RANOLAZINE ER 500 MG PO TB12
1000.0000 mg | ORAL_TABLET | Freq: Two times a day (BID) | ORAL | Status: DC
  Administered 2021-11-05 – 2021-11-06 (×4): 1000 mg via ORAL
  Filled 2021-11-04 (×5): qty 2

## 2021-11-04 MED ORDER — LORAZEPAM 2 MG/ML IJ SOLN: 0.5000 mg | Freq: Three times a day (TID) | INTRAMUSCULAR | Status: DC | PRN

## 2021-11-04 MED ORDER — CARVEDILOL 6.25 MG PO TABS
6.2500 mg | ORAL_TABLET | Freq: Two times a day (BID) | ORAL | Status: DC
  Administered 2021-11-05 – 2021-11-06 (×3): 6.25 mg via ORAL
  Filled 2021-11-04 (×3): qty 1

## 2021-11-04 MED ORDER — POLYSACCHARIDE IRON COMPLEX 150 MG PO CAPS
150.0000 mg | ORAL_CAPSULE | Freq: Every day | ORAL | Status: DC
  Administered 2021-11-05 – 2021-11-06 (×2): 150 mg via ORAL
  Filled 2021-11-04 (×2): qty 1

## 2021-11-04 NOTE — ED Notes (Signed)
Patient transported to MRI 

## 2021-11-04 NOTE — Consult Note (Signed)
Neurology Consultation Reason for Consult: Slowed speech Referring Physician: Mccuen  CC: Slowed speech  History is obtained from: Patient  HPI: Patrick Turner is a 76 y.o. male who was in his normal state of health at [age], and then sometime between [age] and 9 began experiencing neurological symptoms.  He states that he dropped things, and began having "jerking."  On both sides.  He began having difficulty speaking and was brought into the emergency department for this.  On arrival, he was uncontrollably crying, but improved some still following some Ativan.  He was taken for head CT which was negative but once his Ativan allowed for more exam, it was clear that his speech was slowed.  At that point, a code stroke was called.   LKW: 9 AM tpa given?: no, mild symptoms   ROS: A ROS was performed and is negative except as noted in the HPI.   Past Medical History:  Diagnosis Date   Altered mental status    Anemia    Anxiety    Aortic atherosclerosis (HCC)    Arthritis    Coronary artery disease    CRD (chronic renal disease)    Depression    Diabetes mellitus without complication (HCC)    Encephalopathy acute    GERD (gastroesophageal reflux disease)    Headache    Hypercholesteremia    Hypertension    Neuropathy    Severe obesity (HCC)    Sleep apnea    Sleep terror    per patient this year per patient      Family History  Problem Relation Age of Onset   Diabetes Mother    Heart disease Mother    Cancer Father    COPD Father      Social History:  reports that he has never smoked. He has never used smokeless tobacco. He reports current alcohol use. He reports that he does not use drugs.   Exam: Current vital signs: BP (!) 192/71    Pulse 61    Temp 97.7 F (36.5 C) (Oral)    Resp 16    Ht 5\' 5"  (1.651 m)    Wt 84 kg    SpO2 99%    BMI 30.82 kg/m  Vital signs in last 24 hours: Temp:  [97.7 F (36.5 C)] 97.7 F (36.5 C) (02/23 1127) Pulse Rate:  [48-64] 61  (02/23 1430) Resp:  [9-25] 16 (02/23 1430) BP: (174-233)/(65-96) 192/71 (02/23 1430) SpO2:  [98 %-100 %] 99 % (02/23 1430) Weight:  [84 kg] 84 kg (02/23 1129)   Physical Exam  Constitutional: Appears well-developed and well-nourished.  Psych: Affect appropriate to situation Eyes: No scleral injection HENT: No OP obstruction MSK: no joint deformities.  Cardiovascular: Normal rate and regular rhythm.  Respiratory: Effort normal, non-labored breathing GI: Soft.  No distension. There is no tenderness.  Skin: WDI  Neuro: Mental Status: Patient is awake, alert, oriented to person, place, month, year, and situation. Patient is able to give a clear and coherent history. No signs of neglect. He has some stuttering speech which clears at times and is inconsistent. Cranial Nerves: II: Visual Fields are full. Pupils are equal, round, and reactive to light.   III,IV, VI: EOMI without ptosis or diploplia.  V: Facial sensation is symmetric to temperature VII: Facial movement is symmetric.  VIII: hearing is intact to voice X: Uvula elevates symmetrically XI: Shoulder shrug is symmetric. XII: tongue is midline without atrophy or fasciculations.  Motor: Tone is normal.  Bulk is normal. 5/5 strength was present in all four extremities.  He has inconsistent giveaway weakness of all four extremities, not clearly asterixis. Sensory: Sensation is symmetric to light touch and temperature in the arms and legs. Cerebellar: No ataxia on finger-nose-finger     I have reviewed labs in epic and the results pertinent to this consultation are: Creatinine 1.36 Hemoglobin 11 Normal LFTs  I have reviewed the images obtained: CT/CTA-negative  Impression: 76 year old male with an inconsistent exam who presents with slurred speech in the setting of severe hypertension.  Hypertensive emergency is a possibility, with bilateral nature of his symptoms and nonspecific speech abnormality, I do not think that  TIA is at all likely.  I do think an MRI is reasonable, but if this is negative then I would treat this as hypertensive urgency rather than any type of TIA.  Recommendations: 1) MRI brain 2) if negative, no further acute work-up from a neurological perspective is needed, but would treat his acute hypertension.   Roland Rack, MD [facility] [phone]  If 7pm- 7am, please page neurology on call as listed in [place].

## 2021-11-04 NOTE — ED Notes (Signed)
Activated Code Stroke w/[facility] @ 13:12pm

## 2021-11-04 NOTE — ED Notes (Signed)
Pt returned from CT at this time.  Neurologist declines tPa at this time.

## 2021-11-04 NOTE — ED Triage Notes (Signed)
Pt to ER via EMS from home with c/o uncontrollable crying.  Pt arrives alert and orients, able to answer questions.  Pt has no c/o but is hyperventilating and alternates between crying and talking in a normal voice.  Dr. Starleen Blue to bedside for assessment.

## 2021-11-04 NOTE — H&P (Signed)
History and Physical    Patrick Turner VZD:[id number] DOB: Aug 14, 1946 DOA: 11/04/2021  Referring MD/NP/PA:   PCP: Kirk Ruths, MD   Patient coming from:  The patient is coming from home.  At baseline, pt is independent for most of ADL.        Chief Complaint: weakness, slowed speech, difficulty walking  HPI: Patrick Turner is a 76 y.o. male with medical history significant of hypertension, hyperlipidemia, diabetes mellitus, GERD, depression with anxiety, OSA, CKD-3A, anemia, headache, CAD with stent placement, bilateral carotid artery stenosis, mild dementia, chronic mesenteric ischemia, who presents with weakness, slurred speech, difficulty walking.  Patient was last known normal at about 9 AM.  Patient developed symptoms that include hand shaking shaking, slowed speech, dropping things, weakness, particularly in both legs.  Patient has difficulty walking.  Per report, patient cried in ED.  When I saw patient in the emergency room, he is calm.  He has slowed speech.  He moves all extremities.  No facial droop.  Patient denies chest pain, cough, shortness breath.  No fever or chills.  No nausea, vomiting, diarrhea or abdominal pain.  Denies symptoms of UTI.  Data Reviewed and ED Course: pt was found to have WBC 7.2, troponin level 5, 6, negative COVID PCR, potassium 5.5 without EKG change, renal function close to baseline, temperature normal, blood pressure 233/76, heart rate 48-60, RR 25, oxygen saturation 100% on room air.  CT of head is negative for acute intracranial abnormalities.  CT angiogram of head and neck negative for LVO.  MRI of the brain is negative for acute stroke, but showed prior chronic subarachnoid hemorrhage.  Patient is placed on PCU for observation. Dr. Leonel Ramsay of neurology is consulted.  MRI-brain: 1. No acute intracranial abnormality. 2. 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 3. Minimal chronic small vessel ischemia.  EKG: I have personally reviewed.  Sinus rhythm, QTc 422, LAD.  Review of Systems:   General: no fevers, chills, no body weight gain, has fatigue HEENT: no blurry vision, hearing changes or sore throat Respiratory: no dyspnea, coughing, wheezing CV: no chest pain, no palpitations GI: no nausea, vomiting, abdominal pain, diarrhea, constipation GU: no dysuria, burning on urination, increased urinary frequency, hematuria  Ext: no leg edema Neuro: no unilateral weakness, numbness, or tingling, no vision change or hearing loss. Pt has slowed speech, hand shaking. Skin: no rash, no skin tear. MSK: No muscle spasm, no deformity, no limitation of range of movement in spin Heme: No easy bruising.  Travel history: No recent long distant travel.   Allergy:  Allergies  Allergen Reactions   Penicillins Anaphylaxis    Has patient had a PCN reaction causing immediate rash, facial/tongue/throat swelling, SOB or lightheadedness with hypotension: Yes Has patient had a PCN reaction causing severe rash involving mucus membranes or skin necrosis: No Has patient had a PCN reaction that required hospitalization Yes Has patient had a PCN reaction occurring within the last 10 years: No If all of the above answers are "NO", then may proceed with Cephalosporin use.   Tavist [Clemastine] Swelling    Past Medical History:  Diagnosis Date   Altered mental status    Anemia    Anxiety    Aortic atherosclerosis (HCC)    Arthritis    Coronary artery disease    CRD (chronic renal disease)    Depression    Diabetes mellitus without complication ([place])  Encephalopathy acute    GERD (gastroesophageal reflux disease)    Headache    Hypercholesteremia    Hypertension    Neuropathy    Severe obesity (HCC)    Sleep apnea    Sleep terror    per patient this year per patient     Past Surgical History:  Procedure Laterality Date    APPENDECTOMY     CIRCUMCISION     COLONOSCOPY     COLONOSCOPY WITH PROPOFOL N/A 07/10/2018   Procedure: COLONOSCOPY WITH PROPOFOL;  Surgeon: Lollie Sails, MD;  Location: [facility] ENDOSCOPY;  Service: Endoscopy;  Laterality: N/A;   COLONOSCOPY WITH PROPOFOL N/A 04/23/2019   Procedure: COLONOSCOPY WITH PROPOFOL;  Surgeon: Lollie Sails, MD;  Location: [facility] ENDOSCOPY;  Service: Endoscopy;  Laterality: N/A;   CORONARY STENT INTERVENTION N/A 10/03/2019   Procedure: CORONARY STENT INTERVENTION;  Surgeon: Yolonda Kida, MD;  Location: [place] CV LAB;  Service: Cardiovascular;  Laterality: N/A;   ESOPHAGOGASTRODUODENOSCOPY     ESOPHAGOGASTRODUODENOSCOPY (EGD) WITH PROPOFOL N/A 04/23/2019   Procedure: ESOPHAGOGASTRODUODENOSCOPY (EGD) WITH PROPOFOL;  Surgeon: Lollie Sails, MD;  Location: [facility] ENDOSCOPY;  Service: Endoscopy;  Laterality: N/A;   INTRAMEDULLARY (IM) NAIL INTERTROCHANTERIC Left 09/17/2021   Procedure: INTRAMEDULLARY (IM) NAIL INTERTROCHANTRIC;  Surgeon: Willaim Sheng, MD;  Location: [place];  Service: Orthopedics;  Laterality: Left;   JOINT REPLACEMENT     KNEE ARTHROPLASTY Right 06/27/2016   Procedure: COMPUTER ASSISTED TOTAL KNEE ARTHROPLASTY;  Surgeon: Dereck Leep, MD;  Location: [facility] ORS;  Service: Orthopedics;  Laterality: Right;   KNEE ARTHROSCOPY Right    LEFT HEART CATH AND CORONARY ANGIOGRAPHY N/A 10/03/2019   Procedure: Left Heart Cath and possible Coronary intervention;  Surgeon: Dionisio David, MD;  Location: [place] CV LAB;  Service: Cardiovascular;  Laterality: N/A;   TONSILLECTOMY     VISCERAL ANGIOGRAPHY N/A 05/23/2019   Procedure: VISCERAL ANGIOGRAPHY;  Surgeon: Algernon Huxley, MD;  Location: [place] CV LAB;  Service: Cardiovascular;  Laterality: N/A;   VISCERAL ANGIOGRAPHY N/A 11/04/2019   Procedure: VISCERAL ANGIOGRAPHY;  Surgeon: Algernon Huxley, MD;  Location: [place] CV LAB;  Service: Cardiovascular;  Laterality: N/A;    Social  History:  reports that he has never smoked. He has never used smokeless tobacco. He reports current alcohol use. He reports that he does not use drugs.  Family History:  Family History  Problem Relation Age of Onset   Diabetes Mother    Heart disease Mother    Cancer Father    COPD Father      Prior to Admission medications   Medication Sig Start Date End Date Taking? Authorizing Provider  ACCU-CHEK AVIVA PLUS test strip Use to test blood sugar twice daily 10/13/19   [provider]  Accu-Chek Softclix Lancets lancets Use to test blood sugar twice daily 10/13/19   [provider]  acetaminophen (TYLENOL) 325 MG tablet Take 2 tablets (650 mg total) by mouth every 6 (six) hours as needed for mild pain (or Fever >/= 101). Patient not taking: Reported on 09/16/2021 10/14/19   Thornell Mule, MD  ascorbic acid (VITAMIN C) 500 MG tablet Take 500 mg by mouth daily.    [provider]  aspirin 81 MG tablet Take 1 tablet (81 mg total) by mouth daily. 02/23/18   Henreitta Leber, MD  atorvastatin (LIPITOR) 80 MG tablet Take 80 mg by mouth at bedtime. 12/14/15   [provider]  carvedilol (COREG) 6.25  MG tablet Take 6.25 mg by mouth 2 (two) times daily with a meal.    [provider]  Cholecalciferol 50 MCG (2000 UT) TABS Take 2,000 Units by mouth daily. 08/26/21 08/26/22  [provider]  donepezil (ARICEPT) 5 MG tablet Take 1 tablet (5 mg total) by mouth at bedtime. 09/22/21   Shary Key, DO  enoxaparin (LOVENOX) 30 MG/0.3ML injection Inject 0.3 mLs (30 mg total) into the skin every 12 (twelve) hours for 24 days. 09/22/21 10/16/21  Shary Key, DO  insulin detemir (LEVEMIR) 100 UNIT/ML injection Inject 15 Units into the skin at bedtime. 03/04/19   [provider]  iron polysaccharides (NIFEREX) 150 MG capsule Take 150 mg by mouth daily.    [provider]  metFORMIN (GLUCOPHAGE-XR) 500 MG 24 hr tablet Take 500 mg by mouth 2  (two) times daily with a meal. 2 tablets twice daily with meals    [provider]  Omega-3 Fatty Acids (FISH OIL) 1000 MG CAPS Take 1,000 mg by mouth 2 (two) times daily.     [provider]  OZEMPIC, 0.25 OR 0.5 MG/DOSE, 2 MG/1.5ML SOPN Inject 1 Dose into the skin once a week. Wednesdays 03/04/19   [provider]  pantoprazole (PROTONIX) 40 MG tablet Take 40 mg by mouth daily.    [provider]  PARoxetine (PAXIL) 40 MG tablet Take 40 mg by mouth daily.     [provider]  ranolazine (RANEXA) 500 MG 12 hr tablet Take 500 mg by mouth 2 (two) times daily. 10/07/19   [provider]  vitamin B-12 (CYANOCOBALAMIN) 1000 MCG tablet Take 1,000 mcg by mouth daily.    [provider]    Physical Exam: Vitals:   11/04/21 1336 11/04/21 1400 11/04/21 1430 11/04/21 1545  BP: (!) 174/96 (!) 190/65 (!) 192/71 (!) 217/68  Pulse: (!) 48 63 61 60  Resp: 18 20 16 18   Temp:      TempSrc:      SpO2: 98% 98% 99% 100%  Weight:      Height:       General: Not in acute distress HEENT:       Eyes: PERRL, EOMI, no scleral icterus.       ENT: No discharge from the ears and nose, no pharynx injection, no tonsillar enlargement.        Neck: No JVD, no bruit, no mass felt. Heme: No neck lymph node enlargement. Cardiac: S1/S2, RRR, No murmurs, No gallops or rubs. Respiratory: No rales, wheezing, rhonchi or rubs. GI: Soft, nondistended, nontender, no rebound pain, no organomegaly, BS present. GU: No hematuria Ext: No pitting leg edema bilaterally. 1+DP/PT pulse bilaterally. Musculoskeletal: No joint deformities, No joint redness or warmth, no limitation of ROM in spin. Skin: No rashes.  Neuro: Alert, oriented X3, cranial nerves II-XII grossly intact, moves all extremities normally. Pt has slowed speech Psych: Patient is not psychotic, no suicidal or hemocidal ideation.  Labs on Admission: I have personally reviewed following labs and imaging  studies  CBC: Recent Labs  Lab 11/04/21 1133  WBC 7.2  NEUTROABS 5.3  HGB 11.3*  HCT 35.9*  MCV 95.2  PLT 182   Basic Metabolic Panel: Recent Labs  Lab 11/04/21 1133  NA 135  K 5.5*  CL 105  CO2 24  GLUCOSE 204*  BUN 26*  CREATININE 1.36*  CALCIUM 8.7*   GFR: Estimated Creatinine Clearance: 46.1 mL/min (A) (by C-G formula based on SCr of 1.36  mg/dL (H)). Liver Function Tests: Recent Labs  Lab 11/04/21 1133  AST 15  ALT 11  ALKPHOS 97  BILITOT 0.5  PROT 6.6  ALBUMIN 3.6   No results for input(s): LIPASE, AMYLASE in the last 168 hours. No results for input(s): AMMONIA in the last 168 hours. Coagulation Profile: No results for input(s): INR, PROTIME in the last 168 hours. Cardiac Enzymes: No results for input(s): CKTOTAL, CKMB, CKMBINDEX, TROPONINI in the last 168 hours. BNP (last 3 results) No results for input(s): PROBNP in the last 8760 hours. HbA1C: No results for input(s): HGBA1C in the last 72 hours. CBG: No results for input(s): GLUCAP in the last 168 hours. Lipid Profile: No results for input(s): CHOL, HDL, LDLCALC, TRIG, CHOLHDL, LDLDIRECT in the last 72 hours. Thyroid Function Tests: No results for input(s): TSH, T4TOTAL, FREET4, T3FREE, THYROIDAB in the last 72 hours. Anemia Panel: No results for input(s): VITAMINB12, FOLATE, FERRITIN, TIBC, IRON, RETICCTPCT in the last 72 hours. Urine analysis:    Component Value Date/Time   COLORURINE YELLOW 09/20/2021 0030   APPEARANCEUR CLEAR 09/20/2021 0030   APPEARANCEUR Clear 11/23/2012 0629   LABSPEC 1.025 09/20/2021 0030   LABSPEC 1.018 11/23/2012 0629   PHURINE 6.0 09/20/2021 0030   GLUCOSEU NEGATIVE 09/20/2021 0030   GLUCOSEU Negative 11/23/2012 0629   HGBUR NEGATIVE 09/20/2021 0030   BILIRUBINUR NEGATIVE 09/20/2021 0030   BILIRUBINUR Negative 11/23/2012 0629   KETONESUR NEGATIVE 09/20/2021 0030   PROTEINUR NEGATIVE 09/20/2021 0030   NITRITE NEGATIVE 09/20/2021 0030   LEUKOCYTESUR NEGATIVE  09/20/2021 0030   LEUKOCYTESUR Negative 11/23/2012 0629   Sepsis Labs: @LABRCNTIP (procalcitonin:4,lacticidven:4) ) Recent Results (from the past 240 hour(s))  Resp Panel by RT-PCR (Flu A&B, Covid) Nasopharyngeal Swab     Status: None   Collection Time: 11/04/21  1:44 PM   Specimen: Nasopharyngeal Swab; Nasopharyngeal(NP) swabs in vial transport medium  Result Value Ref Range Status   SARS Coronavirus 2 by RT PCR NEGATIVE NEGATIVE Final    Comment: (NOTE) SARS-CoV-2 target nucleic acids are NOT DETECTED.  The SARS-CoV-2 RNA is generally detectable in upper respiratory specimens during the acute phase of infection. The lowest concentration of SARS-CoV-2 viral copies this assay can detect is 138 copies/mL. A negative result does not preclude SARS-Cov-2 infection and should not be used as the sole basis for treatment or other patient management decisions. A negative result may occur with  improper specimen collection/handling, submission of specimen other than nasopharyngeal swab, presence of viral mutation(s) within the areas targeted by this assay, and inadequate number of viral copies(<138 copies/mL). A negative result must be combined with clinical observations, patient history, and epidemiological information. The expected result is Negative.  Fact Sheet for Patients:  EntrepreneurPulse.com.au  Fact Sheet for Healthcare Providers:  IncredibleEmployment.be  This test is no t yet approved or cleared by the [place] FDA and  has been authorized for detection and/or diagnosis of SARS-CoV-2 by FDA under an Emergency Use Authorization (EUA). This EUA will remain  in effect (meaning this test can be used) for the duration of the COVID-19 declaration under Section 564(b)(1) of the Act, 21 U.S.C.section 360bbb-3(b)(1), unless the authorization is terminated  or revoked sooner.       Influenza A by PCR NEGATIVE NEGATIVE Final   Influenza B  by PCR NEGATIVE NEGATIVE Final    Comment: (NOTE) The Xpert Xpress SARS-CoV-2/FLU/RSV plus assay is intended as an aid in the diagnosis of influenza from Nasopharyngeal swab specimens and should not be used as a  sole basis for treatment. Nasal washings and aspirates are unacceptable for Xpert Xpress SARS-CoV-2/FLU/RSV testing.  Fact Sheet for Patients: EntrepreneurPulse.com.au  Fact Sheet for Healthcare Providers: IncredibleEmployment.be  This test is not yet approved or cleared by the [place] FDA and has been authorized for detection and/or diagnosis of SARS-CoV-2 by FDA under an Emergency Use Authorization (EUA). This EUA will remain in effect (meaning this test can be used) for the duration of the COVID-19 declaration under Section 564(b)(1) of the Act, 21 U.S.C. section 360bbb-3(b)(1), unless the authorization is terminated or revoked.  Performed at [facility], [address] [place], [place] [address]      Radiological Exams on Admission: CT HEAD WO CONTRAST (5MM)  Result Date: 11/04/2021 CLINICAL DATA:  Mental status change, unknown cause EXAM: CT HEAD WITHOUT CONTRAST TECHNIQUE: Contiguous axial images were obtained from the base of the skull through the vertex without intravenous contrast. RADIATION DOSE REDUCTION: This exam was performed according to the departmental dose-optimization program which includes automated exposure control, adjustment of the mA and/or kV according to patient size and/or use of iterative reconstruction technique. COMPARISON:  09/16/2021. FINDINGS: Brain: No evidence of acute large vascular territory infarction, hemorrhage, hydrocephalus, extra-axial collection or mass lesion/mass effect. Similar mild atrophy and mild chronic microvascular ischemic disease. Similar small dilated perivascular space remote lacunar infarct in the inferior right basal ganglia. Vascular: No hyperdense vessel identified.  Calcific intracranial atherosclerosis. Skull: No acute fracture. Sinuses/Orbits: Mild paranasal sinus mucosal thickening. Unremarkable orbits. Other: No mastoid effusions. IMPRESSION: No evidence of acute intracranial abnormality. Electronically Signed   By: Margaretha Sheffield M.D.   On: 11/04/2021 12:22   MR BRAIN WO CONTRAST  Result Date: 11/04/2021 CLINICAL DATA:  Neuro deficit, acute, stroke suspected. EXAM: MRI HEAD WITHOUT CONTRAST TECHNIQUE: Multiplanar, multiecho pulse sequences of the brain and surrounding structures were obtained without intravenous contrast. COMPARISON:  Head CT and CTA 11/04/2021.  Head MRI 12/24/2018. FINDINGS: Brain: There is no evidence of an acute infarct, mass, midline shift, or extra-axial fluid collection. A small amount of superficial siderosis involving the high posterior right frontal lobe is new from the prior MRI and compatible with interval but nonacute subarachnoid hemorrhage. A few small T2 hyperintensities in the cerebral white matter are within normal limits for age. There is an unchanged chronic lacunar infarct at the posterior aspect of the right lentiform nucleus. Dilated perivascular spaces are again noted inferiorly in the basal ganglia, and there is unchanged minimal chronic T2 heterogeneity in the thalami. Mild cerebral atrophy is within normal limits for age. Vascular: Major intracranial vascular flow voids are preserved. Skull and upper cervical spine: Unremarkable bone marrow signal. Sinuses/Orbits: Bilateral cataract extraction. Mild mucosal thickening in the paranasal sinuses. Clear mastoid air cells. Other: None. IMPRESSION: 1. No acute intracranial abnormality. 2. 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 3. Minimal chronic small vessel ischemia. Electronically Signed   By: Logan Bores M.D.   On: 11/04/2021 15:42   CT ANGIO HEAD NECK W WO CM (CODE  STROKE)  Result Date: 11/04/2021 CLINICAL DATA:  Stroke, follow up EXAM: CT ANGIOGRAPHY HEAD AND NECK TECHNIQUE: Multidetector CT imaging of the head and neck was performed using the standard protocol during bolus administration of intravenous contrast. Multiplanar CT image reconstructions and MIPs were obtained to evaluate the vascular anatomy. Carotid stenosis measurements (when applicable) are obtained utilizing NASCET criteria, using the distal internal carotid diameter as the  denominator. RADIATION DOSE REDUCTION: This exam was performed according to the departmental dose-optimization program which includes automated exposure control, adjustment of the mA and/or kV according to patient size and/or use of iterative reconstruction technique. CONTRAST:  71mL OMNIPAQUE IOHEXOL 350 MG/ML SOLN COMPARISON:  None. FINDINGS: CTA NECK FINDINGS Aortic arch: Great vessel origins are patent. Right carotid system: No evidence of dissection, stenosis (50% or greater) or occlusion. Mild atherosclerosis at the carotid bifurcation. Retropharyngeal course. Left carotid system: No evidence of dissection, stenosis (50% or greater) or occlusion. Carotid bifurcation atherosclerosis with approximately 20-30% stenosis. Retropharyngeal course. Vertebral arteries: Codominant. No evidence of dissection, stenosis (50% or greater) or occlusion. Skeleton: Severe multilevel degenerative change in the lower cervical spine, including disc height loss, endplate sclerosis and posterior disc/osteophyte complexes. Other neck: No evidence of acute abnormality on limited assessment. Upper chest: Visualized lung apices are clear. Review of the MIP images confirms the above findings CTA HEAD FINDINGS Anterior circulation: Bilateral intracranial ICAs are patent with mild narrowing due to calcific atherosclerosis. Bilateral MCAs, and ACAs are patent without proximal hemodynamically significant stenosis. No aneurysm identified. Posterior circulation:  Bilateral intradural vertebral arteries, basilar artery, and posterior cerebral arteries are patent without proximal hemodynamically significant stenosis. Venous sinuses: As permitted by contrast timing, patent. Review of the MIP images confirms the above findings IMPRESSION: 1. No large vessel occlusion. 2. Mild bilateral intracranial ICA stenosis. 3. Approximately 20-30% stenosis of the left proximal ICA in the neck. Findings communicated to Dr. Leonel Ramsay via secure text page at 1:45 p.m. Electronically Signed   By: Margaretha Sheffield M.D.   On: 11/04/2021 13:50      Assessment/Plan Principal Problem:   Hypertensive urgency Active Problems:   HTN (hypertension), benign   HLD (hyperlipidemia)   Hyperlipidemia associated with type 2 diabetes mellitus (HCC)   Hyperkalemia   Chronic kidney disease, stage 3a (HCC)   Mild dementia   Slow rate of speech   Type II diabetes mellitus with renal manifestations (HCC)   Depression with anxiety   CAD (coronary artery disease)   SAH (subarachnoid hemorrhage) (HCC)   Hypertensive urgency and hx of HTN: Bp is 233/76. Pt is taking coreg Diovan at home -will place in PCU for obs -IV prn hydralazine 10 mg every 2 hours for SBP > 165 - start amlodipine 10 mg daily -Continue home Coreg -Irbesartan in hospital  Slow rate of speech and weakness: Likely due to hypertensive urgency.  Per report, patient cried in the ED, but when I saw patient in ED, patient is calm.  Does not seem to be psychotic. MRI of brain is negative for stroke, but it showed prior chronic subarachnoid hemorrhage.  Per neurology, Dr. Leonel Ramsay, patient can continue aspirin and can use SQ Lovenox ppx. No need to consult neurosurgeon. -PT/OT  SAH (subarachnoid hemorrhage) (HCC) -no intervention needed per Dr. Saralyn Pilar of neurology  HLD (hyperlipidemia) -Lipitor  Hyperkalemia: K 5.5. No EKG change -10 g of lokelma was given in Ed -IVF: 500 ml of NS bolus  Chronic kidney disease,  stage 3a ([place]): Close to baseline.  Baseline creatinine 1.2-11.4 recently.  His creatinine is 1.36, BUN 26 -f/u with BMP  Mild dementia -Donepezil  Type II diabetes mellitus with renal manifestations [facility]): Recent A1c 7.6, poorly controlled.  Patient is taking metformin, Ozempic and Levemir 15 units daily -Sliding scale insulin -Glargine insulin 10 units daily   Depression with anxiety -Continue home medications  CAD (coronary artery disease): S/p of stent placement.  No chest pain -  Continue aspirin, Lipitor, Ranexa              DVT ppx: Lovenox  Code Status: Full code  Family Communication: I called his son who did not pick up the phone, I left message for him.  Disposition Plan:  Anticipate discharge back to previous environment  Consults called:  Dr. Leonel Ramsay  Admission status and Level of care: Progressive:     for obs     Severity of Illness:  The appropriate patient status for this patient is OBSERVATION. Observation status is judged to be reasonable and necessary in order to provide the required intensity of service to ensure the patient's safety. The patient's presenting symptoms, physical exam findings, and initial radiographic and laboratory data in the context of their medical condition is felt to place them at decreased risk for further clinical deterioration. Furthermore, it is anticipated that the patient will be medically stable for discharge from the hospital within 2 midnights of admission.        Date of Service 11/04/2021    Ivor Costa [facility]   If 7PM-7AM, please contact night-coverage [email or web address] 11/04/2021, 5:25 PM

## 2021-11-04 NOTE — ED Provider Notes (Signed)
[facility] Provider Note    Event Date/Time   First MD Initiated Contact with Patient 11/04/21 1117     (approximate)   History   Anxiety   HPI  Patrick Turner is a 76 y.o. male medical history of anxiety, coronary disease, prior hip fracture, diabetes who presents with anxiety.  Patient is fairly inconsolable at the time my evaluation so history is difficult however he does tell me that he had sat up from a chair and felt his legs gave out and then fell back into the chair.  He did not fall to the ground.  He says he had a similar fall which caused his hip fracture.  He endorses mild chest discomfort but denies shortness of breath.  He denies abdominal pain nausea vomiting.  He had a fall 2 days ago on to cement on his buttock.  Did not hit his head.  Does not is worsening generalized pain in the buttock region after this fall.    Past Medical History:  Diagnosis Date   Altered mental status    Anemia    Anxiety    Aortic atherosclerosis (HCC)    Arthritis    Coronary artery disease    CRD (chronic renal disease)    Depression    Diabetes mellitus without complication (HCC)    Encephalopathy acute    GERD (gastroesophageal reflux disease)    Headache    Hypercholesteremia    Hypertension    Neuropathy    Severe obesity ([place])    Sleep apnea    Sleep terror    per patient this year per patient     Patient Active Problem List   Diagnosis Date Noted   Mild dementia    Stage 3b chronic kidney disease ([place])    Closed left hip fracture, initial encounter ([place]) 09/16/2021   Anemia in chronic kidney disease 05/28/2020   Benign hypertensive kidney disease with chronic kidney disease 05/28/2020   Proteinuria 05/28/2020   Falls frequently 04/27/2020   Use of cane as ambulatory aid 04/27/2020   Numbness and tingling of both legs 02/18/2020   Primary osteoarthritis of left knee 01/04/2020   Diabetic retinopathy ([place]) 01/01/2020   ASCVD  (arteriosclerotic cardiovascular disease) 10/16/2019   Hospitalization within last 30 days 10/16/2019   Hyperkalemia 10/14/2019   Pseudoaneurysm of right femoral artery ([place]) 10/14/2019   AKI (acute kidney injury) ([place]) 10/13/2019   Unstable angina ([place]) 10/02/2019   Chest pain 10/01/2019   Uncontrolled type 2 diabetes mellitus with hyperglycemia, with long-term current use of insulin ([place]) 09/27/2019   Chronic mesenteric ischemia ([place]) 05/06/2019   Abdominal pain 05/03/2019   Acute on chronic renal failure ([place]) 03/23/2019   AMS (altered mental status) 12/24/2018   Dizziness 06/30/2018   HLD (hyperlipidemia) 02/23/2018   Aortic atherosclerosis ([place]) 11/02/2017   Healthcare maintenance 12/26/2016   DM type 2 with diabetic peripheral neuropathy ([place]) 11/09/2016   Presence of right artificial knee joint 08/14/2016   Bilateral carotid artery disease ([place]) 07/12/2016   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   Acute diarrhea 05/30/2016   Encephalopathy acute 05/30/2016   Anemia 05/30/2016   Acute on chronic renal insufficiency 05/30/2016   Diarrhea 05/30/2016   Chronic kidney disease, stage 3a ([place]) 03/30/2015   Depression, major, in remission (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   Diabetes mellitus with stage 3 chronic kidney disease ([place]) 01/21/2014   HTN (hypertension), benign 01/21/2014   OSA (obstructive sleep apnea) 01/21/2014   Unspecified transient cerebral ischemia 05/29/2012     Physical Exam  Triage Vital Signs: ED Triage Vitals  Enc Vitals Group     BP 11/04/21 1127 (!) 225/81     Pulse Rate 11/04/21 1127 (!) 59     Resp 11/04/21 1127 18     Temp 11/04/21 1127 97.7 F (36.5 C)     Temp Source 11/04/21 1127 Oral     SpO2 11/04/21 1127 100 %      Weight 11/04/21 1129 185 lb 3 oz (84 kg)     Height 11/04/21 1129 5\' 5"  (1.651 m)     Head Circumference --      Peak Flow --      Pain Score 11/04/21 1129 0     Pain Loc --      Pain Edu? --      Excl. in [place]? --     Most recent vital signs: Vitals:   11/04/21 1430 11/04/21 1545  BP: (!) 192/71 (!) 217/68  Pulse: 61 60  Resp: 16 18  Temp:    SpO2: 99% 100%     General: Awake, patient is tearful CV:  Good peripheral perfusion.  Resp:  Normal effort.  Abd:  No distention.  Soft and nontender throughout Neuro:             Awake, Alert, Oriented x 3  Other:  Patient is emotionally labile, intermittently crying, difficult to console   ED Results / Procedures / Treatments  Labs (all labs ordered are listed, but only abnormal results are displayed) Labs Reviewed  CBC WITH DIFFERENTIAL/PLATELET - Abnormal; Notable for the following components:      Result Value   RBC 3.77 (*)    Hemoglobin 11.3 (*)    HCT 35.9 (*)    All other components within normal limits  COMPREHENSIVE METABOLIC PANEL - Abnormal; Notable for the following components:   Potassium 5.5 (*)    Glucose, Bld 204 (*)    BUN 26 (*)    Creatinine, Ser 1.36 (*)    Calcium 8.7 (*)    GFR, Estimated 54 (*)    All other components within normal limits  RESP PANEL BY RT-PCR (FLU A&B, COVID) ARPGX2  TROPONIN I (HIGH SENSITIVITY)  TROPONIN I (HIGH SENSITIVITY)     EKG  EKG interpreted by myself, normal sinus rhythm, normal axis, Q waves in the inferior leads, inverted T waves lateral leads  RADIOLOGY I reviewed the CT scan of the brain which does not show any acute intracranial process; agree with radiology report     PROCEDURES:  Critical Care performed: No  .1-3 Lead EKG Interpretation Performed by: Rada Hay, MD Authorized by: Rada Hay, MD     Interpretation: abnormal     ECG rate assessment: bradycardic     Rhythm: sinus bradycardia     Ectopy: none     Conduction: normal     The patient is on the cardiac monitor to evaluate for evidence of arrhythmia and/or significant heart rate changes.   MEDICATIONS ORDERED IN ED: Medications  LORazepam (ATIVAN) tablet 0.5 mg (0.5 mg Oral Given 11/04/21 1137)  hydrALAZINE (APRESOLINE) injection 10 mg (10 mg Intravenous Given 11/04/21 1312)  iohexol (OMNIPAQUE) 350 MG/ML injection 75 mL (75 mLs Intravenous Contrast Given 11/04/21 1325)  sodium zirconium cyclosilicate (LOKELMA) packet 10 g (10 g Oral Given 11/04/21 1554)     IMPRESSION / MDM / ASSESSMENT AND PLAN / ED COURSE  I reviewed the triage vital signs and the nursing notes.                              Differential diagnosis includes, but is not limited to, panic attack, ACS, hypertensive emergency, intracranial hemorrhage   Patient is a 76 year old male with a prior history of anxiety who presents to the ER for somewhat unclear reasons.  EMS tells me that he started having an episode of anxiety and generalized shakiness.  Patient tells me that he stood up from chair and then felt his legs go out and had to sit back down in the chair.  He is extremely anxious and emotionally labile and tearful making it difficult to even obtain a history from him.  He is somewhat inconsolable.  He is noted to have had a fall 2 days ago onto his butt without hitting his head.  Review of records patient does have history of anxiety is on Paxil but I do not see where he has been this anxious before in the ER.  Unclear if he is just anxious because of his prior hip fracture and now being in the hospital is triggering him.  Will need to work-up for medical reasons.  We will obtain a CT of the head, his exam is nonfocal.  Patient CT head is negative.  After 0.5 mg p.o. Ativan he is much more calm.  In speaking with him he seems to have some subtle difficulty finding his words.  The rest of his neurologic exam is nonfocal.  Technically he is in the window for tPA.  Discussed with Dr. Leonel Ramsay  who recommended calling a stroke alert.  Stroke alert called.  He was taken for CTA which is negative for LVO.  tPA was not recommended and Dr. Saralyn Pilar thought that the symptoms may be psychogenic.  He will need an MRI.  I also gave him 10 of hydralazine in the event that this was related to his blood pressure.  Time of signout he is pending an MRI.  If MRI is negative for acute stroke and he is able to ambulate he can be discharged.  Patient's MRI is negative for acute findings.  Upon return from MRI blood pressure back up to 217/68.  I reassessed the patient and he is complaining of weakness in his lower extremities.  He seems to have full strength with plantarflexion dorsiflexion, sensation is also intact.  I got him up to walk and he was unable to take a full step, very shaky.  Unclear if this is a hypertensive encephalopathy or deconditioning but given this acute change in his ability to ambulate and mental status change earlier with his hypertension I do not think he is appropriate for discharge.  Will discuss with the hospitalist for admission      FINAL CLINICAL IMPRESSION(S) / ED DIAGNOSES   Final diagnoses:  Altered mental status, unspecified altered mental status type     Rx / DC Orders   ED Discharge Orders     None        Note:  This document was prepared using Dragon voice recognition software and may include unintentional dictation errors.   Rada Hay, MD 11/04/21 [phone]

## 2021-11-04 NOTE — Progress Notes (Signed)
CODE STROKE- PHARMACY COMMUNICATION   Time CODE STROKE called/page received:[id number]  Time response to CODE STROKE was made (in person or via phone): in person  Time Stroke Kit retrieved from [place] (only if needed):n/a *No TNK or BP meds needed at this time *  Name of Provider/Nurse contacted:DR Leonel Ramsay  Past Medical History:  Diagnosis Date   Altered mental status    Anemia    Anxiety    Aortic atherosclerosis ([place])    Arthritis    Coronary artery disease    CRD (chronic renal disease)    Depression    Diabetes mellitus without complication (HCC)    Encephalopathy acute    GERD (gastroesophageal reflux disease)    Headache    Hypercholesteremia    Hypertension    Neuropathy    Severe obesity ([place])    Sleep apnea    Sleep terror    per patient this year per patient    Prior to Admission medications   Medication Sig Start Date End Date Taking? Authorizing Provider  ACCU-CHEK AVIVA PLUS test strip Use to test blood sugar twice daily 10/13/19   [provider]  Accu-Chek Softclix Lancets lancets Use to test blood sugar twice daily 10/13/19   [provider]  acetaminophen (TYLENOL) 325 MG tablet Take 2 tablets (650 mg total) by mouth every 6 (six) hours as needed for mild pain (or Fever >/= 101). Patient not taking: Reported on 09/16/2021 10/14/19   Thornell Mule, MD  ascorbic acid (VITAMIN C) 500 MG tablet Take 500 mg by mouth daily.    [provider]  aspirin 81 MG tablet Take 1 tablet (81 mg total) by mouth daily. 02/23/18   Henreitta Leber, MD  atorvastatin (LIPITOR) 80 MG tablet Take 80 mg by mouth at bedtime. 12/14/15   [provider]  carvedilol (COREG) 6.25 MG tablet Take 6.25 mg by mouth 2 (two) times daily with a meal.    [provider]  Cholecalciferol 50 MCG (2000 UT) TABS Take 2,000 Units by mouth daily. 08/26/21 08/26/22  [provider]  donepezil (ARICEPT) 5 MG tablet Take 1 tablet (5 mg total) by mouth at  bedtime. 09/22/21   Shary Key, DO  enoxaparin (LOVENOX) 30 MG/0.3ML injection Inject 0.3 mLs (30 mg total) into the skin every 12 (twelve) hours for 24 days. 09/22/21 10/16/21  Shary Key, DO  insulin detemir (LEVEMIR) 100 UNIT/ML injection Inject 15 Units into the skin at bedtime. 03/04/19   [provider]  iron polysaccharides (NIFEREX) 150 MG capsule Take 150 mg by mouth daily.    [provider]  metFORMIN (GLUCOPHAGE-XR) 500 MG 24 hr tablet Take 500 mg by mouth 2 (two) times daily with a meal. 2 tablets twice daily with meals    [provider]  Omega-3 Fatty Acids (FISH OIL) 1000 MG CAPS Take 1,000 mg by mouth 2 (two) times daily.     [provider]  OZEMPIC, 0.25 OR 0.5 MG/DOSE, 2 MG/1.5ML SOPN Inject 1 Dose into the skin once a week. Wednesdays 03/04/19   [provider]  pantoprazole (PROTONIX) 40 MG tablet Take 40 mg by mouth daily.    [provider]  PARoxetine (PAXIL) 40 MG tablet Take 40 mg by mouth daily.     [provider]  ranolazine (RANEXA) 500 MG 12 hr tablet Take 500 mg by mouth 2 (two) times daily. 10/07/19   [provider]  vitamin B-12 (CYANOCOBALAMIN) 1000 MCG tablet Take  1,000 mcg by mouth daily.    [provider]   Cara Aguino Rodriguez-Guzman PharmD, BCPS 11/04/2021 1:25 PM

## 2021-11-04 NOTE — ED Notes (Signed)
Decision made to call code stroke, Neurologist and stroke coordinator at bedside at this time.

## 2021-11-04 NOTE — Code Documentation (Signed)
Stroke Response Nurse Documentation Code Documentation  Patrick Turner is a 76 y.o. male arriving to [facility] ED via [facility] on 11/04/2021 with past medical hx of HTN. Code stroke was activated by EDP.   Patient from home where he was LKW at 0900. His wife called EMS bc pt was having tremors and uncontrollable crying.   Stroke team at the bedside with call of code stroke. Labs drawn and patient cleared for CT by Dr. Starleen Blue. Patient to CT with team. NIHSS 2, see documentation for details and code stroke times. The following imaging was completed:  CT, CTA head and neck. Patient is not a candidate for IV Thrombolytic per Dr Leonel Ramsay. LKW >4.5hrs  Care/Plan: Pt to have MRI and BP monitoring.   Bedside handoff with ED RN Anderson Malta.    Velta Addison Stroke Designer, fashion/clothing

## 2021-11-05 DIAGNOSIS — I160 Hypertensive urgency: Secondary | ICD-10-CM | POA: Diagnosis not present

## 2021-11-05 DIAGNOSIS — R4182 Altered mental status, unspecified: Secondary | ICD-10-CM | POA: Diagnosis not present

## 2021-11-05 LAB — BASIC METABOLIC PANEL
Anion gap: 5 (ref 5–15)
BUN: 21 mg/dL (ref 8–23)
CO2: 26 mmol/L (ref 22–32)
Calcium: 8.4 mg/dL — ABNORMAL LOW (ref 8.9–10.3)
Chloride: 104 mmol/L (ref 98–111)
Creatinine, Ser: 1.2 mg/dL (ref 0.61–1.24)
GFR, Estimated: >60 mL/min (ref >60)
Glucose, Bld: 136 mg/dL — ABNORMAL HIGH (ref 70–99)
Potassium: 4.6 mmol/L (ref 3.5–5.1)
Sodium: 135 mmol/L (ref 135–145)

## 2021-11-05 LAB — CBG MONITORING, ED
Glucose-Capillary: 147 mg/dL — ABNORMAL HIGH (ref 70–99)
Glucose-Capillary: 121 mg/dL — ABNORMAL HIGH (ref 70–99)
Glucose-Capillary: 183 mg/dL — ABNORMAL HIGH (ref 70–99)

## 2021-11-05 LAB — GLUCOSE, CAPILLARY
Glucose-Capillary: 110 mg/dL — ABNORMAL HIGH (ref 70–99)
Glucose-Capillary: 176 mg/dL — ABNORMAL HIGH (ref 70–99)

## 2021-11-05 NOTE — Hospital Course (Signed)
This 76 years old male with PMH significant for hypertension, hyperlipidemia, diabetes, GERD, depression with anxiety, OSA, CKD stage IIIa, anemia, headache, CAD with stent placement, bilateral carotid artery stenosis, mild dementia, chronic mesenteric ischemia presented in the ED with complaints of weakness, slurred speech and difficulty walking.  Work-up in the ED unremarkable.  CT head negative for any acute intracranial abnormalities.  CTA head and neck negative for LVO.  MRI brain is negative for any acute stroke but showed prior chronic subarachnoid hemorrhage.  Neurology was consulted.

## 2021-11-05 NOTE — Progress Notes (Signed)
Patient is awake, alert, oriented and appropriate.  With negative MRI, I have low suspicion for any type of cerebrovascular etiology.  I suspect that this was hypertensive emergency, no further testing needed from a neurological standpoint at this time.  Please call with further questions or concerns.  Roland Rack, MD [facility] [phone]  If 7pm- 7am, please page neurology on call as listed in [place].

## 2021-11-05 NOTE — TOC Initial Note (Signed)
Transition of Care [facility]) - Initial/Assessment Note    Patient Details  Name: Patrick Turner MRN: [id number] Date of Birth: 11-14-1945  [facility] of [facility]) CM/SW Contact:    Shelbie Hutching, RN Phone Number: 11/05/2021, 3:11 PM  Clinical Narrative:                 Patient placed under observation for hypertensive urgency and anxiety.  RNCM met with patient at the bedside in the emergency room.  Patient reports feeling much better and he is expecting to go home tomorrow.   Patient is from home with his wife, he has been walking with a walker.  Patient drives, he and his wife still work and he hopes to be able to go back to work soon- he works at [employer].  .   Current PT recommendation for home health, patient agrees with home health PT and OT being set up.  Cory with [facility] accepted [facility] referral for PT and OT.   Patient's wife should be able to pick him up at discharge.   Expected Discharge Plan: [place] Barriers to Discharge: Continued Medical Work up   Patient Goals and CMS Choice Patient states their goals for this hospitalization and ongoing recovery are:: Patient hopes to go home tomorrow CMS Medicare.gov Compare Post Acute Care list provided to:: Patient Choice offered to / list presented to : Patient  Expected Discharge Plan and Services Expected Discharge Plan: [place]   Discharge Planning Services: CM Consult Post Acute Care Choice: [place] arrangements for the past 2 months: Single Family Home                 DME Arranged: N/A DME Agency: NA       HH Arranged: PT, OT HH Agency: [place] Date [place]: 11/05/21 Time [place]: 1509 Representative spoke with at [place]: Tommi Rumps  Prior Living Arrangements/Services Living arrangements for the past 2 months: [place] Lives with:: Spouse Patient language and need for interpreter reviewed:: Yes Do you feel safe  going back to the place where you live?: Yes      Need for Family Participation in Patient Care: Yes (Comment) Care giver support system in place?: Yes (comment) (wife) Current home services: DME (walker and cane) Criminal Activity/Legal Involvement Pertinent to Current Situation/Hospitalization: No - Comment as needed  Activities of Daily Living      Permission Sought/Granted Permission sought to share information with : Case Manager, Family Supports, Other (comment) Permission granted to share information with : Yes, Verbal Permission Granted  Share Information with NAME: Tyresse Jayson  Permission granted to share info w AGENCY: home health agency  Permission granted to share info w Relationship: spouse  Permission granted to share info w Contact Information: [phone]  Emotional Assessment Appearance:: Appears stated age Attitude/Demeanor/Rapport: Engaged Affect (typically observed): Accepting Orientation: : Oriented to Self, Oriented to Place, Oriented to  Time, Oriented to Situation Alcohol / Substance Use: Not Applicable Psych Involvement: No (comment)  Admission diagnosis:  Hypertensive urgency [I16.0] Patient Active Problem List   Diagnosis Date Noted   Hypertensive urgency 11/04/2021   Slow rate of speech 11/04/2021   Type II diabetes mellitus with renal manifestations ([place]) 11/04/2021   Depression with anxiety 11/04/2021   CAD (coronary artery disease) 11/04/2021   SAH (subarachnoid hemorrhage) ([place]) 11/04/2021   Mild dementia    Stage 3b chronic kidney disease ([place])  Closed left hip fracture, initial encounter ([place]) 09/16/2021   Anemia in chronic kidney disease 05/28/2020   Benign hypertensive kidney disease with chronic kidney disease 05/28/2020   Proteinuria 05/28/2020   Falls frequently 04/27/2020   Use of cane as ambulatory aid 04/27/2020   Numbness and tingling of both legs 02/18/2020   Primary osteoarthritis of left knee 01/04/2020   Diabetic  retinopathy ([place]) 01/01/2020   ASCVD (arteriosclerotic cardiovascular disease) 10/16/2019   Hospitalization within last 30 days 10/16/2019   Hyperkalemia 10/14/2019   Pseudoaneurysm of right femoral artery ([place]) 10/14/2019   AKI (acute kidney injury) ([place]) 10/13/2019   Unstable angina ([place]) 10/02/2019   Chest pain 10/01/2019   Uncontrolled type 2 diabetes mellitus with hyperglycemia, with long-term current use of insulin ([place]) 09/27/2019   Chronic mesenteric ischemia ([place]) 05/06/2019   Abdominal pain 05/03/2019   Acute on chronic renal failure ([place]) 03/23/2019   AMS (altered mental status) 12/24/2018   Dizziness 06/30/2018   HLD (hyperlipidemia) 02/23/2018   Aortic atherosclerosis ([place]) 11/02/2017   Healthcare maintenance 12/26/2016   DM type 2 with diabetic peripheral neuropathy ([place]) 11/09/2016   Presence of right artificial knee joint 08/14/2016   Bilateral carotid artery disease ([place]) 07/12/2016   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   Acute diarrhea 05/30/2016   Encephalopathy acute 05/30/2016   Anemia 05/30/2016   Acute on chronic renal insufficiency 05/30/2016   Diarrhea 05/30/2016   Chronic kidney disease, stage 3a ([place]) 03/30/2015   Depression, major, in remission ([place]) 08/02/2014   Severe obesity (BMI 35.0-39.9) with comorbidity ([place]) 07/12/2014   GERD (gastroesophageal reflux disease) 03/08/2014   Hyperlipidemia associated with type 2 diabetes mellitus ([place]) 03/08/2014   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

## 2021-11-05 NOTE — Evaluation (Signed)
Occupational Therapy Evaluation Patient Details Name: Patrick Turner MRN: [id number] DOB: 1946/03/11 Today's Date: 11/05/2021   History of Present Illness Pt is a 76 y.o. male presenting to hospital with anxiety and generalized shakiness; pt reports standing from chair and felt legs go out and had to sit back down in chair.  Pt initially emotionally labile and tearful; inconsolable.  Noted with weakness, slowed speech, and difficulty walking.  Imaging negative for acute intracranial abnormality (does show prior SAH--chronic but new from 2020).  Of note, pt s/p ORIF L intertrochanteric femur fx (L LE WBAT) 09/16/21.  Pt admitted with hypertensive urgency (h/o htn), slow rate of speech, weakness, and hyperkalemia.  PMH includes anxiety, AMS, anemia, CAD, CRD, depression, DM, HA, neuropathy, sleep apnea, mild dementia, L hip fx, h/o falls, diabetic retinopathy, TKA.   Clinical Impression   Pt seen for OT evaluation this date in setting of acute hospitalization d/t hypertensive urgency. He is INDEP at baseline including working. Pt presents this date with some slight balance deficits and slightly decreased fxl activity tolerance. He requires CGA/SUPV with RW for ADL transfers and mobility and MIN A for seated LB ADLs d/t some limited ROM in L hip. He tolerates session well and BP is generally well controlled with SBP consistently in 130s throughout session. Pt left with all needs met and in reach. Will continue to follow. Recommend pt f/u with HHOT before resuming OP therapy services.      Recommendations for follow up therapy are one component of a multi-disciplinary discharge planning process, led by the attending physician.  Recommendations may be updated based on patient status, additional functional criteria and insurance authorization.   Follow Up Recommendations  Home health OT    Assistance Recommended at Discharge Set up Supervision/Assistance  Patient can return home with the following Help  with stairs or ramp for entrance;Assist for transportation    Functional Status Assessment  Patient has had a recent decline in their functional status and demonstrates the ability to make significant improvements in function in a reasonable and predictable amount of time.  Equipment Recommendations  BSC/3in1    Recommendations for Other Services       Precautions / Restrictions Precautions Precautions: Fall Restrictions Weight Bearing Restrictions: No      Mobility Bed Mobility Overal bed mobility: Modified Independent             General bed mobility comments: MOD I for back to bed, demos good ability to get BLE back to bed    Transfers Overall transfer level: Needs assistance Equipment used: Rolling walker (2 wheels) Transfers: Sit to/from Stand, Bed to chair/wheelchair/BSC Sit to Stand: Supervision, Min guard                  Balance Overall balance assessment: Needs assistance Sitting-balance support: No upper extremity supported, Feet supported Sitting balance-Leahy Scale: Normal     Standing balance support: Bilateral upper extremity supported, During functional activity Standing balance-Leahy Scale: Good Standing balance comment: able to alternate hands from walker for static standing balance.                           ADL either performed or assessed with clinical judgement   ADL  General ADL Comments: Pt requires SETUP for seated UB, MIN A for seated LB, CGA/SUPV for ADL transfers and mobility with RW.     Vision Baseline Vision/History: 1 Wears glasses Patient Visual Report: No change from baseline       Perception     Praxis      Pertinent Vitals/Pain Pain Assessment Pain Assessment: 0-10 Pain Score: 2  Pain Location: "bottom" Pain Descriptors / Indicators: Sore Pain Intervention(s): Limited activity within patient's tolerance, Monitored during session,  Repositioned     Hand Dominance Right   Extremity/Trunk Assessment Upper Extremity Assessment Upper Extremity Assessment: Overall WFL for tasks assessed;Generalized weakness (ROM WFL, MMT grossly 4-/5)   Lower Extremity Assessment Lower Extremity Assessment: Overall WFL for tasks assessed;Generalized weakness (limited hip rotation with L side worse than R. MMT grossly 4-/5)   Cervical / Trunk Assessment Cervical / Trunk Assessment: Normal   Communication Communication Communication: HOH (pt wearing hearing aides during session)   Cognition Arousal/Alertness: Awake/alert Behavior During Therapy: WFL for tasks assessed/performed Overall Cognitive Status: Within Functional Limits for tasks assessed                                       General Comments       Exercises Other Exercises Other Exercises: OT ed re: role, safety, d/c recs   Shoulder Instructions      Home Living Family/patient expects to be discharged to:: Private residence Living Arrangements: Spouse/significant other Available Help at Discharge: Family;Available 24 hours/day Type of Home: House Home Access: Stairs to enter [employer] of Steps: 2 Entrance Stairs-Rails: None Home Layout: Two level;Bed/bath upstairs Alternate Level Stairs-Number of Steps: 13 Alternate Level Stairs-Rails: Right (does have L rail for short distance initially going up stairs) Bathroom Shower/Tub: Teacher, early years/pre: Standard     Home Equipment: Conservation officer, nature (2 wheels);Cane - single point;Grab bars - tub/shower;Toilet riser   Additional Comments: pt cannot give PT any history on his abiltiy to walk or layout of home prior to fall.      Prior Functioning/Environment Prior Level of Function : Independent/Modified Independent             Mobility Comments: Ambulatory with [facility]PC; h/o falls (recently pt fell on his "bottom")          OT Problem List: Decreased activity  tolerance      OT Treatment/Interventions: Self-care/ADL training;Therapeutic exercise;Therapeutic activities    OT Goals(Current goals can be found in the care plan section) Acute Rehab OT Goals Patient Stated Goal: to go home and eventually get back to work in april OT Goal Formulation: With patient Time For Goal Achievement: 11/19/21 Potential to Achieve Goals: Good  OT Frequency: Min 2X/week    Co-evaluation              AM-PAC OT "6 Clicks" Daily Activity     Outcome Measure Help from another person eating meals?: None Help from another person taking care of personal grooming?: None Help from another person toileting, which includes using toliet, bedpan, or urinal?: A Little Help from another person bathing (including washing, rinsing, drying)?: A Little Help from another person to put on and taking off regular upper body clothing?: None Help from another person to put on and taking off regular lower body clothing?: A Little 6 Click Score: 21   End of Session Equipment Utilized During Treatment: Rolling walker (2  wheels) Nurse Communication: Mobility status  Activity Tolerance: Patient tolerated treatment well Patient left: in bed;with call bell/phone within reach  OT Visit Diagnosis: Unsteadiness on feet (R26.81)                Time: 0814-4818 OT Time Calculation (min): 25 min Charges:  OT General Charges $OT Visit: 1 Visit OT Evaluation $OT Eval Low Complexity: 1 Low OT Treatments $Self Care/Home Management : 8-22 mins  Gerrianne Scale, MS, OTR/L ascom [phone] 11/05/21, 4:41 PM

## 2021-11-05 NOTE — Care Management Obs Status (Signed)
[facility] NOTIFICATION   Patient Details  Name: Patrick Turner MRN: [id number] Date of Birth: [birth year]   Medicare Observation Status Notification Given:  Yes    Shelbie Hutching, RN 11/05/2021, 2:43 PM

## 2021-11-05 NOTE — Evaluation (Signed)
Physical Therapy Evaluation Patient Details Name: Patrick Turner MRN: [id number] DOB: 1945/12/06 Today's Date: 11/05/2021  History of Present Illness  Pt is a 76 y.o. male presenting to hospital with anxiety and generalized shakiness; pt reports standing from chair and felt legs go out and had to sit back down in chair.  Pt initially emotionally labile and tearful; inconsolable.  Noted with weakness, slowed speech, and difficulty walking.  Imaging negative for acute intracranial abnormality (does show prior SAH--chronic but new from 2020).  Of note, pt s/p ORIF L intertrochanteric femur fx (L LE WBAT) 09/16/21.  Pt admitted with hypertensive urgency (h/o htn), slow rate of speech, weakness, and hyperkalemia.  PMH includes anxiety, AMS, anemia, CAD, CRD, depression, DM, HA, neuropathy, sleep apnea, mild dementia, L hip fx, h/o falls, diabetic retinopathy, TKA.  Clinical Impression  Prior to ED visit, pt was modified independent ambulating with [facility]; h/o falls; and lives with his wife in 2 story home with steps to navigate.  Pt reports finishing HHPT this past Monday and was about to transition to OP PT (for balance).  Currently pt is modified independent semi-supine to sitting edge of bed; CGA progressing to SBA with transfers using RW; and CGA progressing to SBA ambulating 100 feet with RW use.  Pt steady ambulating with RW during session.  Generalized weakness noted.  Pt would benefit from skilled PT to address noted impairments and functional limitations (see below for any additional details).  Upon hospital discharge, pt would benefit from [place].    Recommendations for follow up therapy are one component of a multi-disciplinary discharge planning process, led by the attending physician.  Recommendations may be updated based on patient status, additional functional criteria and insurance authorization.  Follow Up Recommendations Home health PT    Assistance Recommended at Discharge PRN  Patient can  return home with the following  Assistance with cooking/housework;Assist for transportation;Help with stairs or ramp for entrance    Equipment Recommendations Rolling walker (2 wheels)  Recommendations for Other Services       Functional Status Assessment Patient has had a recent decline in their functional status and demonstrates the ability to make significant improvements in function in a reasonable and predictable amount of time.     Precautions / Restrictions Precautions Precautions: Fall Restrictions Weight Bearing Restrictions: No      Mobility  Bed Mobility Overal bed mobility: Modified Independent             General bed mobility comments: Semi-supine to sitting edge of bed without any noted difficulties.    Transfers Overall transfer level: Needs assistance Equipment used: Rolling walker (2 wheels) Transfers: Sit to/from Stand, Bed to chair/wheelchair/BSC Sit to Stand: Min guard, Supervision   Step pivot transfers: Supervision (stand step turn with RW for toileting (in bathroom))       General transfer comment: CGA to stand from ED stretcher bed; SBA to stand from toilet; mild increased effort to stand but steady and safe    Ambulation/Gait Ambulation/Gait assistance: Min guard, Supervision Gait Distance (Feet): 100 Feet Assistive device: Rolling walker (2 wheels)   Gait velocity: decreased     General Gait Details: partial to mostly step through gait pattern; steady with RW use  Stairs            Wheelchair Mobility    Modified Rankin (Stroke Patients Only)       Balance Overall balance assessment: Needs assistance Sitting-balance support: No upper extremity supported, Feet supported Sitting balance-Leahy Scale:  Normal Sitting balance - Comments: steady sitting reaching outside BOS   Standing balance support: Bilateral upper extremity supported, During functional activity Standing balance-Leahy Scale: Good Standing balance comment:  no loss of balance noted with ambulation using RW                             Pertinent Vitals/Pain Pain Assessment Pain Assessment: 0-10 Pain Score: 2  Pain Location: "bottom" Pain Descriptors / Indicators: Sore Pain Intervention(s): Limited activity within patient's tolerance, Monitored during session, Repositioned Vitals stable and WFL throughout treatment session. Orthostatics taken during session (d/t pt initially c/o mild lightheadedness sitting and then standing): supine BP 126/60 with HR 61 bpm; sitting BP 118/83 with HR 64 bpm; standing BP at 0 minutes 134/61 with HR 67 bpm; and standing BP at 3 minutes 129/60 with HR 71 bpm.    Home Living Family/patient expects to be discharged to:: Private residence Living Arrangements: Spouse/significant other Available Help at Discharge: Family;Available 24 hours/day Type of Home: House Home Access: Stairs to enter Entrance Stairs-Rails: None Entrance Stairs-Number of Steps: 2 Alternate Level Stairs-Number of Steps: 13 Home Layout: Two level;Bed/bath upstairs Home Equipment: Conservation officer, nature (2 wheels);Cane - single point;Grab bars - tub/shower;Toilet riser (keeps 1 RW upstairs and 1 RW downstairs)      Prior Function Prior Level of Function : Independent/Modified Independent             Mobility Comments: Ambulatory with SPC; h/o falls (recently pt fell on his "bottom")       Hand Dominance        Extremity/Trunk Assessment   Upper Extremity Assessment Upper Extremity Assessment: Defer to OT evaluation    Lower Extremity Assessment Lower Extremity Assessment: Generalized weakness    Cervical / Trunk Assessment Cervical / Trunk Assessment: Normal  Communication   Communication: HOH (pt wearing hearing aides during session)  Cognition Arousal/Alertness: Awake/alert Behavior During Therapy: WFL for tasks assessed/performed Overall Cognitive Status: Within Functional Limits for tasks assessed                                           General Comments  Nursing cleared pt for participation in physical therapy.  Pt agreeable to PT session.    Exercises  Transfer and gait training.   Assessment/Plan    PT Assessment Patient needs continued PT services  PT Problem List Decreased strength;Decreased activity tolerance;Decreased balance;Decreased mobility;Pain       PT Treatment Interventions DME instruction;Gait training;Stair training;Functional mobility training;Therapeutic activities;Therapeutic exercise;Balance training;Patient/family education    PT Goals (Current goals can be found in the Care Plan section)  Acute Rehab PT Goals Patient Stated Goal: to improve strength and balance PT Goal Formulation: With patient Time For Goal Achievement: 11/19/21 Potential to Achieve Goals: Good    Frequency Min 2X/week     Co-evaluation               AM-PAC PT "6 Clicks" Mobility  Outcome Measure Help needed turning from your back to your side while in a flat bed without using bedrails?: None Help needed moving from lying on your back to sitting on the side of a flat bed without using bedrails?: None Help needed moving to and from a bed to a chair (including a wheelchair)?: A Little Help needed standing up from a chair using your arms (  e.g., wheelchair or bedside chair)?: A Little Help needed to walk in hospital room?: A Little Help needed climbing 3-5 steps with a railing? : A Little 6 Click Score: 20    End of Session Equipment Utilized During Treatment: Gait belt Activity Tolerance: Patient tolerated treatment well Patient left:  (sitting on edge of stretcher bed with OT present (for OT evaluation)) Nurse Communication: Mobility status;Precautions PT Visit Diagnosis: Other abnormalities of gait and mobility (R26.89);Muscle weakness (generalized) (M62.81);History of falling (Z91.81)    Time: 2633-3545 PT Time Calculation (min) (ACUTE ONLY): 27  min   Charges:   PT Evaluation $PT Eval Low Complexity: 1 Low PT Treatments $Therapeutic Activity: 8-22 mins       Leitha Bleak, PT 11/05/21, 10:58 AM

## 2021-11-05 NOTE — Progress Notes (Signed)
PROGRESS NOTE    Patrick Turner  ZHG:[id number] DOB: 1946-04-17 DOA: 11/04/2021 PCP: Kirk Ruths, MD   Brief Narrative:  This 76 years old male with PMH significant for hypertension, hyperlipidemia, diabetes, GERD, depression with anxiety, OSA, CKD stage IIIa, anemia, headache, CAD with stent placement, bilateral carotid artery stenosis, mild dementia, chronic mesenteric ischemia presented in the ED with complaints of weakness, slurred speech and difficulty walking.  Work-up in the ED unremarkable.  CT head negative for any acute intracranial abnormalities.  CTA head and neck negative for LVO.  MRI brain is negative for any acute stroke but showed prior chronic subarachnoid hemorrhage.  Neurology was consulted.  Assessment & Plan:   Principal Problem:   Hypertensive urgency Active Problems:   HTN (hypertension), benign   HLD (hyperlipidemia)   Hyperkalemia   Chronic kidney disease, stage 3a (HCC)   Mild dementia   Slow rate of speech   Type II diabetes mellitus with renal manifestations (HCC)   Depression with anxiety   CAD (coronary artery disease)   SAH (subarachnoid hemorrhage) (HCC)  Slurring of speech / weakness: Likely secondary to hypertensive urgency. CT head negative for stroke.   MRI showed prior chronic subarachnoid hemorrhage but no acute stroke.   Neurology was consulted. Recommended to continue aspirin.   No neurosurgical intervention needed. PT and OT consult.  Hypertensive urgency: BP on arrival 233/76.  He takes Coreg, Diovan at home. Continue Coreg Diovan  Start amlodipine 10 mg. Hydralazine 10 mg IV every 6 as needed.  Subarachnoid hemorrhage: MRI shows prior chronic subarachnoid hemorrhage. No intervention needed as per Dr. Katherine Roan.  Hyperlipidemia: Continue Lipitor  CKD stage IIIa: Serum creatinine at baseline. Avoid nephrotoxic medications.   Dementia: Continue donepezil.  Hyperkalemia: Lokelma given.  Resolved   Type 2  diabetes: Hemoglobin A1c 7.6. Regular insulin sliding scale. Lantus 10 units daily.  Depression with anxiety: Continue lorazepam, Paxil, Seroquel.  CAD (coronary artery disease):  s/p of stent placement.  No chest pain Continue aspirin, Lipitor, Ranexa      DVT prophylaxis: Lovenox Code Status: Full code Family Communication: No family at bedside Disposition Plan:   Status is: Observation The patient remains OBS appropriate and will d/c before 2 midnights.   Admitted for weakness and slurring of speech, so far work-up has been negative.  Patient blood pressure was very elevated started on blood pressure medication.   Consultants:  Neurology  Procedures: CT head, CTA head and neck, MRI brain Antimicrobials: None  Subjective: Patient was seen and examined at bedside.  Overnight events noted.   Patient reports feeling improved.  He still has some weakness.  Objective: Vitals:   11/05/21 1230 11/05/21 1300 11/05/21 1330 11/05/21 1458  BP: (!) 118/52 (!) 113/54 (!) 127/57   Pulse: (!) 57 (!) 57 67   Resp: 20 19 (!) 21   Temp:    98.3 F (36.8 C)  TempSrc:    Oral  SpO2: 98% 97% 100%   Weight:      Height:        Intake/Output Summary (Last 24 hours) at 11/05/2021 1516 Last data filed at 11/05/2021 0800 Gross per 24 hour  Intake 500 ml  Output 1400 ml  Net -900 ml   Filed Weights   11/04/21 1129  Weight: 84 kg    Examination:  General exam: Appears comfortable, not in any acute distress. Respiratory system: Clear to auscultation. Respiratory effort normal.  Respiratory effort normal. Cardiovascular system: S1-S2 heard, regular rate and  rhythm, no murmur. Gastrointestinal system: Abdomen is soft, nontender, nondistended, BS+ Central nervous system: Alert and oriented x 3 . No focal neurological deficits.  Slowed speech. Extremities: No edema, no cyanosis, no clubbing. Skin: No rashes, lesions or ulcers Psychiatry: Judgement and insight appear normal. Mood  & affect appropriate.     Data Reviewed: I have personally reviewed following labs and imaging studies  CBC: Recent Labs  Lab 11/04/21 1133  WBC 7.2  NEUTROABS 5.3  HGB 11.3*  HCT 35.9*  MCV 95.2  PLT 740   Basic Metabolic Panel: Recent Labs  Lab 11/04/21 1133 11/05/21 0745  NA 135 135  K 5.5* 4.6  CL 105 104  CO2 24 26  GLUCOSE 204* 136*  BUN 26* 21  CREATININE 1.36* 1.20  CALCIUM 8.7* 8.4*   GFR: Estimated Creatinine Clearance: 52.2 mL/min (by C-G formula based on SCr of 1.2 mg/dL). Liver Function Tests: Recent Labs  Lab 11/04/21 1133  AST 15  ALT 11  ALKPHOS 97  BILITOT 0.5  PROT 6.6  ALBUMIN 3.6   No results for input(s): LIPASE, AMYLASE in the last 168 hours. No results for input(s): AMMONIA in the last 168 hours. Coagulation Profile: No results for input(s): INR, PROTIME in the last 168 hours. Cardiac Enzymes: No results for input(s): CKTOTAL, CKMB, CKMBINDEX, TROPONINI in the last 168 hours. BNP (last 3 results) No results for input(s): PROBNP in the last 8760 hours. HbA1C: No results for input(s): HGBA1C in the last 72 hours. CBG: Recent Labs  Lab 11/04/21 2133 11/05/21 0134 11/05/21 0750 11/05/21 1148  GLUCAP 235* 147* 121* 183*   Lipid Profile: No results for input(s): CHOL, HDL, LDLCALC, TRIG, CHOLHDL, LDLDIRECT in the last 72 hours. Thyroid Function Tests: No results for input(s): TSH, T4TOTAL, FREET4, T3FREE, THYROIDAB in the last 72 hours. Anemia Panel: No results for input(s): VITAMINB12, FOLATE, FERRITIN, TIBC, IRON, RETICCTPCT in the last 72 hours. Sepsis Labs: No results for input(s): PROCALCITON, LATICACIDVEN in the last 168 hours.  Recent Results (from the past 240 hour(s))  Resp Panel by RT-PCR (Flu A&B, Covid) Nasopharyngeal Swab     Status: None   Collection Time: 11/04/21  1:44 PM   Specimen: Nasopharyngeal Swab; Nasopharyngeal(NP) swabs in vial transport medium  Result Value Ref Range Status   SARS Coronavirus 2 by  RT PCR NEGATIVE NEGATIVE Final    Comment: (NOTE) SARS-CoV-2 target nucleic acids are NOT DETECTED.  The SARS-CoV-2 RNA is generally detectable in upper respiratory specimens during the acute phase of infection. The lowest concentration of SARS-CoV-2 viral copies this assay can detect is 138 copies/mL. A negative result does not preclude SARS-Cov-2 infection and should not be used as the sole basis for treatment or other patient management decisions. A negative result may occur with  improper specimen collection/handling, submission of specimen other than nasopharyngeal swab, presence of viral mutation(s) within the areas targeted by this assay, and inadequate number of viral copies(<138 copies/mL). A negative result must be combined with clinical observations, patient history, and epidemiological information. The expected result is Negative.  Fact Sheet for Patients:  EntrepreneurPulse.com.au  Fact Sheet for Healthcare Providers:  IncredibleEmployment.be  This test is no t yet approved or cleared by the [place] FDA and  has been authorized for detection and/or diagnosis of SARS-CoV-2 by FDA under an Emergency Use Authorization (EUA). This EUA will remain  in effect (meaning this test can be used) for the duration of the COVID-19 declaration under Section 564(b)(1) of the Act, 21  U.S.C.section 360bbb-3(b)(1), unless the authorization is terminated  or revoked sooner.       Influenza A by PCR NEGATIVE NEGATIVE Final   Influenza B by PCR NEGATIVE NEGATIVE Final    Comment: (NOTE) The Xpert Xpress SARS-CoV-2/FLU/RSV plus assay is intended as an aid in the diagnosis of influenza from Nasopharyngeal swab specimens and should not be used as a sole basis for treatment. Nasal washings and aspirates are unacceptable for Xpert Xpress SARS-CoV-2/FLU/RSV testing.  Fact Sheet for Patients: EntrepreneurPulse.com.au  Fact Sheet  for Healthcare Providers: IncredibleEmployment.be  This test is not yet approved or cleared by the [place] FDA and has been authorized for detection and/or diagnosis of SARS-CoV-2 by FDA under an Emergency Use Authorization (EUA). This EUA will remain in effect (meaning this test can be used) for the duration of the COVID-19 declaration under Section 564(b)(1) of the Act, 21 U.S.C. section 360bbb-3(b)(1), unless the authorization is terminated or revoked.  Performed at [facility], [address], [place], [place] [address]          Radiology Studies: CT HEAD WO CONTRAST (5MM)  Result Date: 11/04/2021 CLINICAL DATA:  Mental status change, unknown cause EXAM: CT HEAD WITHOUT CONTRAST TECHNIQUE: Contiguous axial images were obtained from the base of the skull through the vertex without intravenous contrast. RADIATION DOSE REDUCTION: This exam was performed according to the departmental dose-optimization program which includes automated exposure control, adjustment of the mA and/or kV according to patient size and/or use of iterative reconstruction technique. COMPARISON:  09/16/2021. FINDINGS: Brain: No evidence of acute large vascular territory infarction, hemorrhage, hydrocephalus, extra-axial collection or mass lesion/mass effect. Similar mild atrophy and mild chronic microvascular ischemic disease. Similar small dilated perivascular space remote lacunar infarct in the inferior right basal ganglia. Vascular: No hyperdense vessel identified. Calcific intracranial atherosclerosis. Skull: No acute fracture. Sinuses/Orbits: Mild paranasal sinus mucosal thickening. Unremarkable orbits. Other: No mastoid effusions. IMPRESSION: No evidence of acute intracranial abnormality. Electronically Signed   By: Margaretha Sheffield M.D.   On: 11/04/2021 12:22   MR BRAIN WO CONTRAST  Result Date: 11/04/2021 CLINICAL DATA:  Neuro deficit, acute, stroke suspected. EXAM: MRI  HEAD WITHOUT CONTRAST TECHNIQUE: Multiplanar, multiecho pulse sequences of the brain and surrounding structures were obtained without intravenous contrast. COMPARISON:  Head CT and CTA 11/04/2021.  Head MRI 12/24/2018. FINDINGS: Brain: There is no evidence of an acute infarct, mass, midline shift, or extra-axial fluid collection. A small amount of superficial siderosis involving the high posterior right frontal lobe is new from the prior MRI and compatible with interval but nonacute subarachnoid hemorrhage. A few small T2 hyperintensities in the cerebral white matter are within normal limits for age. There is an unchanged chronic lacunar infarct at the posterior aspect of the right lentiform nucleus. Dilated perivascular spaces are again noted inferiorly in the basal ganglia, and there is unchanged minimal chronic T2 heterogeneity in the thalami. Mild cerebral atrophy is within normal limits for age. Vascular: Major intracranial vascular flow voids are preserved. Skull and upper cervical spine: Unremarkable bone marrow signal. Sinuses/Orbits: Bilateral cataract extraction. Mild mucosal thickening in the paranasal sinuses. Clear mastoid air cells. Other: None. IMPRESSION: 1. No acute intracranial abnormality. 2. 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 3. Minimal chronic small vessel ischemia. Electronically Signed   By: Logan Bores M.D.   On: 11/04/2021 15:42   CT ANGIO HEAD NECK W WO CM (CODE STROKE)  Result Date: 11/04/2021 CLINICAL DATA:  Stroke, follow up EXAM: CT ANGIOGRAPHY HEAD AND NECK TECHNIQUE: Multidetector CT imaging of the head and neck was performed using the standard protocol during bolus administration of intravenous contrast. Multiplanar CT image reconstructions and MIPs were obtained to evaluate the vascular anatomy. Carotid stenosis measurements (when applicable) are obtained utilizing NASCET  criteria, using the distal internal carotid diameter as the denominator. RADIATION DOSE REDUCTION: This exam was performed according to the departmental dose-optimization program which includes automated exposure control, adjustment of the mA and/or kV according to patient size and/or use of iterative reconstruction technique. CONTRAST:  71mL OMNIPAQUE IOHEXOL 350 MG/ML SOLN COMPARISON:  None. FINDINGS: CTA NECK FINDINGS Aortic arch: Great vessel origins are patent. Right carotid system: No evidence of dissection, stenosis (50% or greater) or occlusion. Mild atherosclerosis at the carotid bifurcation. Retropharyngeal course. Left carotid system: No evidence of dissection, stenosis (50% or greater) or occlusion. Carotid bifurcation atherosclerosis with approximately 20-30% stenosis. Retropharyngeal course. Vertebral arteries: Codominant. No evidence of dissection, stenosis (50% or greater) or occlusion. Skeleton: Severe multilevel degenerative change in the lower cervical spine, including disc height loss, endplate sclerosis and posterior disc/osteophyte complexes. Other neck: No evidence of acute abnormality on limited assessment. Upper chest: Visualized lung apices are clear. Review of the MIP images confirms the above findings CTA HEAD FINDINGS Anterior circulation: Bilateral intracranial ICAs are patent with mild narrowing due to calcific atherosclerosis. Bilateral MCAs, and ACAs are patent without proximal hemodynamically significant stenosis. No aneurysm identified. Posterior circulation: Bilateral intradural vertebral arteries, basilar artery, and posterior cerebral arteries are patent without proximal hemodynamically significant stenosis. Venous sinuses: As permitted by contrast timing, patent. Review of the MIP images confirms the above findings IMPRESSION: 1. No large vessel occlusion. 2. Mild bilateral intracranial ICA stenosis. 3. Approximately 20-30% stenosis of the left proximal ICA in the neck.  Findings communicated to Dr. Leonel Ramsay via secure text page at 1:45 p.m. Electronically Signed   By: Margaretha Sheffield M.D.   On: 11/04/2021 13:50     Scheduled Meds:  amLODipine  10 mg Oral Daily   ascorbic acid  500 mg Oral Daily   aspirin EC  81 mg Oral Daily   atorvastatin  80 mg Oral Daily   carvedilol  6.25 mg Oral BID WC   cholecalciferol  2,000 Units Oral Daily   donepezil  5 mg Oral QHS   enoxaparin (LOVENOX) injection  40 mg Subcutaneous Q24H   gabapentin  400 mg Oral BID   insulin aspart  0-5 Units Subcutaneous QHS   insulin aspart  0-9 Units Subcutaneous TID WC   insulin glargine-yfgn  10 Units Subcutaneous QHS   irbesartan  150 mg Oral Daily   iron polysaccharides  150 mg Oral Daily   omega-3 acid ethyl esters  1 g Oral Daily   pantoprazole  40 mg Oral Daily   PARoxetine  40 mg Oral QHS   QUEtiapine  100 mg Oral QHS   ranolazine  1,000 mg Oral BID   vitamin B-12  1,000 mcg Oral Daily   Continuous Infusions:   LOS: 0 days    Time spent: 50 mins    Djibril Glogowski, MD [facility]   If 7PM-7AM, please contact night-coverage

## 2021-11-06 DIAGNOSIS — I160 Hypertensive urgency: Secondary | ICD-10-CM | POA: Diagnosis not present

## 2021-11-06 DIAGNOSIS — F418 Other specified anxiety disorders: Secondary | ICD-10-CM | POA: Diagnosis not present

## 2021-11-06 DIAGNOSIS — I2583 Coronary atherosclerosis due to lipid rich plaque: Secondary | ICD-10-CM | POA: Diagnosis not present

## 2021-11-06 DIAGNOSIS — I10 Essential (primary) hypertension: Secondary | ICD-10-CM | POA: Diagnosis not present

## 2021-11-06 DIAGNOSIS — I2510 Atherosclerotic heart disease of native coronary artery without angina pectoris: Secondary | ICD-10-CM | POA: Diagnosis not present

## 2021-11-06 DIAGNOSIS — R4182 Altered mental status, unspecified: Secondary | ICD-10-CM | POA: Diagnosis not present

## 2021-11-06 LAB — BASIC METABOLIC PANEL
Anion gap: 11 (ref 5–15)
BUN: 34 mg/dL — ABNORMAL HIGH (ref 8–23)
CO2: 24 mmol/L (ref 22–32)
Calcium: 8.6 mg/dL — ABNORMAL LOW (ref 8.9–10.3)
Chloride: 101 mmol/L (ref 98–111)
Creatinine, Ser: 1.5 mg/dL — ABNORMAL HIGH (ref 0.61–1.24)
GFR, Estimated: 48 mL/min — ABNORMAL LOW (ref >60)
Glucose, Bld: 132 mg/dL — ABNORMAL HIGH (ref 70–99)
Potassium: 4.3 mmol/L (ref 3.5–5.1)
Sodium: 136 mmol/L (ref 135–145)

## 2021-11-06 LAB — CBC
HCT: 33.3 % — ABNORMAL LOW (ref 39.0–52.0)
Hemoglobin: 10.8 g/dL — ABNORMAL LOW (ref 13.0–17.0)
MCH: 29.9 pg (ref 26.0–34.0)
MCHC: 32.4 g/dL (ref 30.0–36.0)
MCV: 92.2 fL (ref 80.0–100.0)
Platelets: 282 10*3/uL (ref 150–400)
RBC: 3.61 MIL/uL — ABNORMAL LOW (ref 4.22–5.81)
RDW: 14.5 % (ref 11.5–15.5)
WBC: 8.7 10*3/uL (ref 4.0–10.5)
nRBC: 0 % (ref 0.0–0.2)

## 2021-11-06 LAB — PHOSPHORUS: Phosphorus: 5 mg/dL — ABNORMAL HIGH (ref 2.5–4.6)

## 2021-11-06 LAB — GLUCOSE, CAPILLARY
Glucose-Capillary: 129 mg/dL — ABNORMAL HIGH (ref 70–99)
Glucose-Capillary: 137 mg/dL — ABNORMAL HIGH (ref 70–99)

## 2021-11-06 LAB — MAGNESIUM: Magnesium: 2.3 mg/dL (ref 1.7–2.4)

## 2021-11-06 LAB — AMMONIA: Ammonia: <10 umol/L (ref 9–35)

## 2021-11-06 MED ORDER — AMLODIPINE BESYLATE 10 MG PO TABS: 10.0000 mg | ORAL_TABLET | Freq: Every day | ORAL | 3 refills | Status: DC

## 2021-11-06 NOTE — Discharge Summary (Signed)
Physician Discharge Summary   Patient: Patrick Turner MRN: [id number] DOB: 09/04/1946  Admit date:     11/04/2021  Discharge date: 11/06/21  Discharge Physician: Edwin Dada   PCP: Kirk Ruths, MD   Recommendations at discharge:  Follow up with PCP Dr. Ouida Sills in 1 week Dr. Ouida Sills: Please review BP and continue amlodipine if needed Dr. Ouida Sills: Please check BMP in 1 week (d/c SCr 1.5 mg/dL) and restart metformin if appropriate Dr. Ouida Sills: Please refer to [place] if covered       Discharge Diagnoses: Principal Problem:   Hypertensive urgency Active Problems:   HTN (hypertension), benign   HLD (hyperlipidemia)   Hyperkalemia   Chronic kidney disease, stage 3b (CKD 3a ruled out)   Mild dementia   Type II diabetes mellitus with renal manifestations ([place])   Depression with anxiety   CAD (coronary artery disease)   History of subarachnoid hemorrhage (no acute subarachnoid hemorrhage this hospital stay)     Hospital Course: Mr. Pavao is a 76 y.o. M with HTN, DM, GERD, depression with anxiety, OSA, CKD stage IIIb, anemia, headache, CAD with stent placement, bilateral carotid artery stenosis, mild dementia, chronic mesenteric ischemia presented in the ED with complaints of weakness, slurred speech and difficulty walking.    In the ER, CT head negative for any acute intracranial abnormalities.  CTA head and neck negative for LVO.  Neurology was consulted.  MRI brain ruled out acute stroke but showed prior chronic subarachnoid hemorrhage.  Blood pressure was severely elevated and Neurology and I felt the likely cause of his weakness.  His BP was controlled with addition of amlodipine.  PT evaluated patient, recommended outpatient PT, which was arranged.  Reocmmend close PCP follow up         Pain control - [place] was reviewed.    Consultants: Neurology Procedures performed: MRI brain   Disposition: Home   DISCHARGE MEDICATION: Allergies as of 11/06/2021       Reactions   Penicillins Anaphylaxis   Has patient had a PCN reaction causing immediate rash, facial/tongue/throat swelling, SOB or lightheadedness with hypotension: Yes Has patient had a PCN reaction causing severe rash involving mucus membranes or skin necrosis: No Has patient had a PCN reaction that required hospitalization Yes Has patient had a PCN reaction occurring within the last 10 years: No If all of the above answers are "NO", then may proceed with Cephalosporin use.   Tavist [clemastine] Swelling        Medication List     STOP taking these medications    acetaminophen 325 MG tablet Commonly known as: TYLENOL   metFORMIN 500 MG 24 hr tablet Commonly known as: GLUCOPHAGE-XR       TAKE these medications    alendronate 70 MG tablet Commonly known as: FOSAMAX Take 70 mg by mouth once a week. Take with a full glass of water on an empty stomach.   amLODipine 10 MG tablet Commonly known as: NORVASC Take 1 tablet (10 mg total) by mouth daily.   ascorbic acid 500 MG tablet Commonly known as: VITAMIN C Take 500 mg by mouth daily.   aspirin 81 MG tablet Take 1 tablet (81 mg total) by mouth daily.   atorvastatin 80 MG tablet Commonly known as: LIPITOR Take 80 mg by mouth daily.   carvedilol 6.25 MG tablet Commonly known as: COREG Take 6.25 mg by mouth 2 (two) times daily with a meal.   Cholecalciferol  50 MCG (2000 UT) Tabs Take 2,000 Units by mouth daily.   donepezil 5 MG tablet Commonly known as: ARICEPT Take 1 tablet (5 mg total) by mouth at bedtime.   enoxaparin 30 MG/0.3ML injection Commonly known as: LOVENOX Inject 0.3 mLs (30 mg total) into the skin every 12 (twelve) hours for 24 days.   escitalopram 10 MG tablet Commonly known as: LEXAPRO Take 10 mg by mouth daily.   Fish Oil 1000 MG Caps Take 1,000 mg by mouth daily.   gabapentin 400 MG capsule Commonly known  as: NEURONTIN Take 400 mg by mouth 2 (two) times daily.   insulin detemir 100 UNIT/ML injection Commonly known as: LEVEMIR Inject 15 Units into the skin at bedtime.   iron polysaccharides 150 MG capsule Commonly known as: NIFEREX Take 150 mg by mouth daily.   pantoprazole 40 MG tablet Commonly known as: PROTONIX Take 40 mg by mouth daily.   PARoxetine 40 MG tablet Commonly known as: PAXIL Take 40 mg by mouth at bedtime.   QUEtiapine 100 MG tablet Commonly known as: SEROQUEL Take 100 mg by mouth at bedtime.   ranolazine 1000 MG SR tablet Commonly known as: RANEXA Take 1,000 mg by mouth 2 (two) times daily.   Semaglutide(0.25 or 0.5MG /DOS) 2 MG/1.5ML Sopn Inject 0.5 mg into the skin every Wednesday.   valsartan 160 MG tablet Commonly known as: DIOVAN Take 160 mg by mouth daily.   vitamin B-12 1000 MCG tablet Commonly known as: CYANOCOBALAMIN Take 1,000 mcg by mouth daily.        Follow-up Information     Kirk Ruths, MD. Schedule an appointment as soon as possible for a visit in 1 week(s).   Specialty: Internal Medicine Contact information: [address] [place] [address] [phone]                Discharge Instructions     Discharge instructions   Complete by: As directed    You were admitted for leg weakness and speech changes.  Here, we were concerned about a stroke, but your detailed brain imaging was normal. You were evaluated by a neurologist who felt that your symptoms were related to high blood pressure. This often takes a little time to resolve, but it will.  You should take your Coreg and Diovan ADD the new blood pressure medicine amlodipine/Norvasc once daily This may cause some mild puffiness in the legs, that is normal and okay Go see Dr. Ouida Sills in 1 week for blood work (check kidney function) and Blood pressure check to decide if you need to continue amlodipine  FOR NOW: Hold metformin  (do not take) and ask Dr. Ouida Sills if you should continue  Continue your anxiety medicines as you were taking them before, as your prescriber instructed you (if you are going to start escitalopram, do so) I recommend you find a therapist to talk to, to augment your therapy   Increase activity slowly   Complete by: As directed         Discharge Exam: Filed Weights   11/04/21 1129 11/05/21 1531 11/06/21 0414  Weight: 84 kg 78.4 kg 79.1 kg   General: Pt is alert, awake, not in acute distress Cardiovascular: RRR, nl S1-S2, no murmurs appreciated.   No LE edema.   Respiratory: Normal respiratory rate and rhythm.  CTAB without rales or wheezes. Abdominal: Abdomen soft and non-tender.  No distension or HSM.   Neuro/Psych: Strength symmetric in upper and  lower extremities.  Judgment and insight appear normal.   Condition at discharge: good  The results of significant diagnostics from this hospitalization (including imaging, microbiology, ancillary and laboratory) are listed below for reference.   Imaging Studies: CT HEAD WO CONTRAST (5MM)  Result Date: 11/04/2021 CLINICAL DATA:  Mental status change, unknown cause EXAM: CT HEAD WITHOUT CONTRAST TECHNIQUE: Contiguous axial images were obtained from the base of the skull through the vertex without intravenous contrast. RADIATION DOSE REDUCTION: This exam was performed according to the departmental dose-optimization program which includes automated exposure control, adjustment of the mA and/or kV according to patient size and/or use of iterative reconstruction technique. COMPARISON:  09/16/2021. FINDINGS: Brain: No evidence of acute large vascular territory infarction, hemorrhage, hydrocephalus, extra-axial collection or mass lesion/mass effect. Similar mild atrophy and mild chronic microvascular ischemic disease. Similar small dilated perivascular space remote lacunar infarct in the inferior right basal ganglia. Vascular: No hyperdense vessel  identified. Calcific intracranial atherosclerosis. Skull: No acute fracture. Sinuses/Orbits: Mild paranasal sinus mucosal thickening. Unremarkable orbits. Other: No mastoid effusions. IMPRESSION: No evidence of acute intracranial abnormality. Electronically Signed   By: Margaretha Sheffield M.D.   On: 11/04/2021 12:22   MR BRAIN WO CONTRAST  Result Date: 11/04/2021 CLINICAL DATA:  Neuro deficit, acute, stroke suspected. EXAM: MRI HEAD WITHOUT CONTRAST TECHNIQUE: Multiplanar, multiecho pulse sequences of the brain and surrounding structures were obtained without intravenous contrast. COMPARISON:  Head CT and CTA 11/04/2021.  Head MRI 12/24/2018. FINDINGS: Brain: There is no evidence of an acute infarct, mass, midline shift, or extra-axial fluid collection. A small amount of superficial siderosis involving the high posterior right frontal lobe is new from the prior MRI and compatible with interval but nonacute subarachnoid hemorrhage. A few small T2 hyperintensities in the cerebral white matter are within normal limits for age. There is an unchanged chronic lacunar infarct at the posterior aspect of the right lentiform nucleus. Dilated perivascular spaces are again noted inferiorly in the basal ganglia, and there is unchanged minimal chronic T2 heterogeneity in the thalami. Mild cerebral atrophy is within normal limits for age. Vascular: Major intracranial vascular flow voids are preserved. Skull and upper cervical spine: Unremarkable bone marrow signal. Sinuses/Orbits: Bilateral cataract extraction. Mild mucosal thickening in the paranasal sinuses. Clear mastoid air cells. Other: None. IMPRESSION: 1. No acute intracranial abnormality. 2. 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 3. Minimal chronic small vessel ischemia. Electronically Signed   By: Logan Bores M.D.   On: 11/04/2021 15:42   CT ANGIO HEAD NECK W WO  CM (CODE STROKE)  Result Date: 11/04/2021 CLINICAL DATA:  Stroke, follow up EXAM: CT ANGIOGRAPHY HEAD AND NECK TECHNIQUE: Multidetector CT imaging of the head and neck was performed using the standard protocol during bolus administration of intravenous contrast. Multiplanar CT image reconstructions and MIPs were obtained to evaluate the vascular anatomy. Carotid stenosis measurements (when applicable) are obtained utilizing NASCET criteria, using the distal internal carotid diameter as the denominator. RADIATION DOSE REDUCTION: This exam was performed according to the departmental dose-optimization program which includes automated exposure control, adjustment of the mA and/or kV according to patient size and/or use of iterative reconstruction technique. CONTRAST:  70mL OMNIPAQUE IOHEXOL 350 MG/ML SOLN COMPARISON:  None. FINDINGS: CTA NECK FINDINGS Aortic arch: Great vessel origins are patent. Right carotid system: No evidence of dissection, stenosis (50% or greater) or occlusion. Mild atherosclerosis at the carotid bifurcation. Retropharyngeal course. Left carotid  system: No evidence of dissection, stenosis (50% or greater) or occlusion. Carotid bifurcation atherosclerosis with approximately 20-30% stenosis. Retropharyngeal course. Vertebral arteries: Codominant. No evidence of dissection, stenosis (50% or greater) or occlusion. Skeleton: Severe multilevel degenerative change in the lower cervical spine, including disc height loss, endplate sclerosis and posterior disc/osteophyte complexes. Other neck: No evidence of acute abnormality on limited assessment. Upper chest: Visualized lung apices are clear. Review of the MIP images confirms the above findings CTA HEAD FINDINGS Anterior circulation: Bilateral intracranial ICAs are patent with mild narrowing due to calcific atherosclerosis. Bilateral MCAs, and ACAs are patent without proximal hemodynamically significant stenosis. No aneurysm identified. Posterior  circulation: Bilateral intradural vertebral arteries, basilar artery, and posterior cerebral arteries are patent without proximal hemodynamically significant stenosis. Venous sinuses: As permitted by contrast timing, patent. Review of the MIP images confirms the above findings IMPRESSION: 1. No large vessel occlusion. 2. Mild bilateral intracranial ICA stenosis. 3. Approximately 20-30% stenosis of the left proximal ICA in the neck. Findings communicated to Dr. Leonel Ramsay via secure text page at 1:45 p.m. Electronically Signed   By: Margaretha Sheffield M.D.   On: 11/04/2021 13:50    Microbiology: Results for orders placed or performed during the hospital encounter of 11/04/21  Resp Panel by RT-PCR (Flu A&B, Covid) Nasopharyngeal Swab     Status: None   Collection Time: 11/04/21  1:44 PM   Specimen: Nasopharyngeal Swab; Nasopharyngeal(NP) swabs in vial transport medium  Result Value Ref Range Status   SARS Coronavirus 2 by RT PCR NEGATIVE NEGATIVE Final    Comment: (NOTE) SARS-CoV-2 target nucleic acids are NOT DETECTED.  The SARS-CoV-2 RNA is generally detectable in upper respiratory specimens during the acute phase of infection. The lowest concentration of SARS-CoV-2 viral copies this assay can detect is 138 copies/mL. A negative result does not preclude SARS-Cov-2 infection and should not be used as the sole basis for treatment or other patient management decisions. A negative result may occur with  improper specimen collection/handling, submission of specimen other than nasopharyngeal swab, presence of viral mutation(s) within the areas targeted by this assay, and inadequate number of viral copies(<138 copies/mL). A negative result must be combined with clinical observations, patient history, and epidemiological information. The expected result is Negative.  Fact Sheet for Patients:  EntrepreneurPulse.com.au  Fact Sheet for Healthcare Providers:   IncredibleEmployment.be  This test is no t yet approved or cleared by the [place] FDA and  has been authorized for detection and/or diagnosis of SARS-CoV-2 by FDA under an Emergency Use Authorization (EUA). This EUA will remain  in effect (meaning this test can be used) for the duration of the COVID-19 declaration under Section 564(b)(1) of the Act, 21 U.S.C.section 360bbb-3(b)(1), unless the authorization is terminated  or revoked sooner.       Influenza A by PCR NEGATIVE NEGATIVE Final   Influenza B by PCR NEGATIVE NEGATIVE Final    Comment: (NOTE) The Xpert Xpress SARS-CoV-2/FLU/RSV plus assay is intended as an aid in the diagnosis of influenza from Nasopharyngeal swab specimens and should not be used as a sole basis for treatment. Nasal washings and aspirates are unacceptable for Xpert Xpress SARS-CoV-2/FLU/RSV testing.  Fact Sheet for Patients: EntrepreneurPulse.com.au  Fact Sheet for Healthcare Providers: IncredibleEmployment.be  This test is not yet approved or cleared by the [place] FDA and has been authorized for detection and/or diagnosis of SARS-CoV-2 by FDA under an Emergency Use Authorization (EUA). This EUA will remain in effect (meaning this test can be used)  for the duration of the COVID-19 declaration under Section 564(b)(1) of the Act, 21 U.S.C. section 360bbb-3(b)(1), unless the authorization is terminated or revoked.  Performed at [facility], [address] [place], [place] [address]     Labs: CBC: Recent Labs  Lab 11/04/21 1133 11/06/21 0531  WBC 7.2 8.7  NEUTROABS 5.3  --   HGB 11.3* 10.8*  HCT 35.9* 33.3*  MCV 95.2 92.2  PLT 267 643   Basic Metabolic Panel: Recent Labs  Lab 11/04/21 1133 11/05/21 0745 11/06/21 0531  NA 135 135 136  K 5.5* 4.6 4.3  CL 105 104 101  CO2 24 26 24   GLUCOSE 204* 136* 132*  BUN 26* 21 34*  CREATININE 1.36* 1.20 1.50*   CALCIUM 8.7* 8.4* 8.6*  MG  --   --  2.3  PHOS  --   --  5.0*   Liver Function Tests: Recent Labs  Lab 11/04/21 1133  AST 15  ALT 11  ALKPHOS 97  BILITOT 0.5  PROT 6.6  ALBUMIN 3.6   CBG: Recent Labs  Lab 11/05/21 1148 11/05/21 1551 11/05/21 2129 11/06/21 0751 11/06/21 1151  GLUCAP 183* 110* 176* 129* 137*    Discharge time spent: 20 minutes.  Signed: Edwin Dada, MD [facility] 11/06/2021

## 2021-11-06 NOTE — Progress Notes (Signed)
Physical Therapy Treatment Patient Details Name: Patrick Turner MRN: [id number] DOB: 1945-10-23 Today's Date: 11/06/2021   History of Present Illness Pt is a 76 y.o. male presenting to hospital with anxiety and generalized shakiness; pt reports standing from chair and felt legs go out and had to sit back down in chair.  Pt initially emotionally labile and tearful; inconsolable.  Noted with weakness, slowed speech, and difficulty walking.  Imaging negative for acute intracranial abnormality (does show prior SAH--chronic but new from 2020).  Of note, pt s/p ORIF L intertrochanteric femur fx (L LE WBAT) 09/16/21.  Pt admitted with hypertensive urgency (h/o htn), slow rate of speech, weakness, and hyperkalemia.  PMH includes anxiety, AMS, anemia, CAD, CRD, depression, DM, HA, neuropathy, sleep apnea, mild dementia, L hip fx, h/o falls, diabetic retinopathy, TKA.    PT Comments    Pt was long sitting in bed upon arriving. He is A and O x 4 and agreeable to session. Easily able to exit bed, stand and ambulate with use of RW. Pt performed ascending/descending stairs without difficulty or safety concerns. Overall pt is doing well. Recommend OOPT at DC to continue to improve pt's safety with all ADLs.    Recommendations for follow up therapy are one component of a multi-disciplinary discharge planning process, led by the attending physician.  Recommendations may be updated based on patient status, additional functional criteria and insurance authorization.  Follow Up Recommendations  Outpatient PT     Assistance Recommended at Discharge PRN  Patient can return home with the following Assistance with cooking/housework;Assist for transportation;Help with stairs or ramp for entrance   Equipment Recommendations  Rolling walker (2 wheels)       Precautions / Restrictions Precautions Precautions: Fall Restrictions Weight Bearing Restrictions: No     Mobility  Bed Mobility Overal bed mobility:  Modified Independent   Transfers Overall transfer level: Needs assistance Equipment used: Rolling walker (2 wheels) Transfers: Sit to/from Stand Sit to Stand: Supervision           General transfer comment: no physical assistance to stand from lowest bed height    Ambulation/Gait Ambulation/Gait assistance: Supervision Gait Distance (Feet): 200 Feet Assistive device: Rolling walker (2 wheels) Gait Pattern/deviations: Step-through pattern Gait velocity: decreased     General Gait Details: Pt was easily able to ambulate 200 ft without LOB or knee buckling." It feels good now."   Stairs Stairs: Yes Stairs assistance: Supervision Stair Management: One rail Right Number of Stairs: 6 General stair comments: pt was able to ascend/descend stairs without difficulty    Balance Overall balance assessment: Needs assistance Sitting-balance support: No upper extremity supported, Feet supported Sitting balance-Leahy Scale: Normal     Standing balance support: Bilateral upper extremity supported, During functional activity Standing balance-Leahy Scale: Good       Cognition Arousal/Alertness: Awake/alert Behavior During Therapy: WFL for tasks assessed/performed Overall Cognitive Status: Within Functional Limits for tasks assessed    General Comments: Pt is A and O x 4               Pertinent Vitals/Pain Pain Assessment Pain Assessment: 0-10 Pain Score: 0-No pain Pain Intervention(s): Limited activity within patient's tolerance, Monitored during session, Repositioned     PT Goals (current goals can now be found in the care plan section) Acute Rehab PT Goals Patient Stated Goal: to improve strength and balance Progress towards PT goals: Progressing toward goals    Frequency    Min 2X/week  PT Plan Current plan remains appropriate       AM-PAC PT "6 Clicks" Mobility   Outcome Measure  Help needed turning from your back to your side while in a flat bed  without using bedrails?: None Help needed moving from lying on your back to sitting on the side of a flat bed without using bedrails?: None Help needed moving to and from a bed to a chair (including a wheelchair)?: A Little Help needed standing up from a chair using your arms (e.g., wheelchair or bedside chair)?: A Little Help needed to walk in hospital room?: A Little Help needed climbing 3-5 steps with a railing? : A Little 6 Click Score: 20    End of Session Equipment Utilized During Treatment: Gait belt Activity Tolerance: Patient tolerated treatment well Patient left: in bed;with call bell/phone within reach;with bed alarm set Nurse Communication: Mobility status;Precautions PT Visit Diagnosis: Other abnormalities of gait and mobility (R26.89);Muscle weakness (generalized) (M62.81);History of falling (Z91.81)     Time: 8299-3716 PT Time Calculation (min) (ACUTE ONLY): 14 min  Charges:  $Gait Training: 8-22 mins                    Julaine Fusi PTA 11/06/21, 12:10 PM

## 2021-11-06 NOTE — Progress Notes (Signed)
Subjective: Called regarding  Exam: Vitals:   11/06/21 0414 11/06/21 0749  BP: (!) 143/65 (!) 162/66  Pulse: 66 70  Resp: 16 17  Temp: 97.6 F (36.4 C) 97.6 F (36.4 C)  SpO2: 95% 100%   Gen: In bed, NAD Resp: non-labored breathing, no acute distress Abd: soft, nt  Neuro: MS: awake, alert.  When I initially walked in the room, he is speaking about his legs, at that time his speech is fluent without any evidence of aphasia.  Once I started asking about his speech, he became markedly more halting, though in a pattern that is atypical. CN: EOMI, face symmetric Motor: He has bilateral symmetric giveway weakness in his lower extremities.  He has a distractible, inconsistent tremor  Pertinent Labs: Creatinine 1.5  Impression: 76 year old male who presents with multiple complaints.  The presentation was atypical from my first evaluation, but given his blood pressures that were severely elevated I ascribed his complaints to hypertensive urgency.  Given the distractible nature of his complaints, as well as the atypical character, I strongly suspect that there is a psychosomatic component at play.  I discussed with him possible stressors, he has been out of work since January 5 when he broke his hip, and is very nervous about going back in April.  Recommendations: 1) I discussed with him that I felt that his symptoms may be at least in part due to stress, and encouraged him to consider therapy in addition to his medication management 2) continue PT/OT 3) neurology will be available as needed.  Roland Rack, MD [facility] [phone]  If 7pm- 7am, please page neurology on call as listed in [place].

## 2021-11-06 NOTE — Progress Notes (Signed)
Patrick Turner to be D/C'd Home per MD order.  Discussed prescriptions and follow up appointments with the patient. Prescription electronically submitted. medication list explained in detail. Pt verbalized understanding.  Allergies as of 11/06/2021       Reactions   Penicillins Anaphylaxis   Has patient had a PCN reaction causing immediate rash, facial/tongue/throat swelling, SOB or lightheadedness with hypotension: Yes Has patient had a PCN reaction causing severe rash involving mucus membranes or skin necrosis: No Has patient had a PCN reaction that required hospitalization Yes Has patient had a PCN reaction occurring within the last 10 years: No If all of the above answers are "NO", then may proceed with Cephalosporin use.   Tavist [clemastine] Swelling        Medication List     STOP taking these medications    acetaminophen 325 MG tablet Commonly known as: TYLENOL   metFORMIN 500 MG 24 hr tablet Commonly known as: GLUCOPHAGE-XR       TAKE these medications    alendronate 70 MG tablet Commonly known as: FOSAMAX Take 70 mg by mouth once a week. Take with a full glass of water on an empty stomach.   amLODipine 10 MG tablet Commonly known as: NORVASC Take 1 tablet (10 mg total) by mouth daily.   ascorbic acid 500 MG tablet Commonly known as: VITAMIN C Take 500 mg by mouth daily.   aspirin 81 MG tablet Take 1 tablet (81 mg total) by mouth daily.   atorvastatin 80 MG tablet Commonly known as: LIPITOR Take 80 mg by mouth daily.   carvedilol 6.25 MG tablet Commonly known as: COREG Take 6.25 mg by mouth 2 (two) times daily with a meal.   Cholecalciferol 50 MCG (2000 UT) Tabs Take 2,000 Units by mouth daily.   donepezil 5 MG tablet Commonly known as: ARICEPT Take 1 tablet (5 mg total) by mouth at bedtime.   enoxaparin 30 MG/0.3ML injection Commonly known as: LOVENOX Inject 0.3 mLs (30 mg total) into the skin every 12 (twelve) hours for 24 days.    escitalopram 10 MG tablet Commonly known as: LEXAPRO Take 10 mg by mouth daily.   Fish Oil 1000 MG Caps Take 1,000 mg by mouth daily.   gabapentin 400 MG capsule Commonly known as: NEURONTIN Take 400 mg by mouth 2 (two) times daily.   insulin detemir 100 UNIT/ML injection Commonly known as: LEVEMIR Inject 15 Units into the skin at bedtime.   iron polysaccharides 150 MG capsule Commonly known as: NIFEREX Take 150 mg by mouth daily.   pantoprazole 40 MG tablet Commonly known as: PROTONIX Take 40 mg by mouth daily.   PARoxetine 40 MG tablet Commonly known as: PAXIL Take 40 mg by mouth at bedtime.   QUEtiapine 100 MG tablet Commonly known as: SEROQUEL Take 100 mg by mouth at bedtime.   ranolazine 1000 MG SR tablet Commonly known as: RANEXA Take 1,000 mg by mouth 2 (two) times daily.   Semaglutide(0.25 or 0.5MG /DOS) 2 MG/1.5ML Sopn Inject 0.5 mg into the skin every Wednesday.   valsartan 160 MG tablet Commonly known as: DIOVAN Take 160 mg by mouth daily.   vitamin B-12 1000 MCG tablet Commonly known as: CYANOCOBALAMIN Take 1,000 mcg by mouth daily.        Vitals:   11/06/21 0749 11/06/21 1144  BP: (!) 162/66 (!) 139/59  Pulse: 70 (!) 59  Resp: 17 17  Temp: 97.6 F (36.4 C) 98.1 F (36.7 C)  SpO2: 100% 98%  Skin clean, dry and intact without evidence of skin break down, no evidence of skin tears noted. IV catheter discontinued intact. Site without signs and symptoms of complications. Dressing and pressure applied. Pt denies pain at this time. No complaints noted.  An After Visit Summary was printed and given to the patient. Patient waiting on ride to be discharged home [name]

## 2021-11-06 NOTE — TOC Transition Note (Signed)
Transition of Care [facility]) - CM/SW Discharge Note   Patient Details  Name: Patrick Turner MRN: [id number] Date of Birth: March 11, 1946  [facility]) CM/SW Contact:  Magnus Ivan, LCSW Phone Number: 11/06/2021, 12:01 PM   Clinical Narrative:   Patient prefers to return to [place] [facility] rather than [facility]. Patient will need to call to set up appointment at [facility].  CSW notified Tommi Rumps with [facility] to cancel Home Health referral.     Final next level of care: OP Rehab Barriers to Discharge: Barriers Resolved   Patient Goals and CMS Choice Patient states their goals for this hospitalization and ongoing recovery are:: Patient hopes to go home tomorrow CMS Medicare.gov Compare Post Acute Care list provided to:: Patient Choice offered to / list presented to : Patient  Discharge Placement                       Discharge Plan and Services   Discharge Planning Services: CM Consult Post Acute Care Choice: Home Health          DME Arranged: N/A DME Agency: NA       HH Arranged: PT, OT [place] Agency: [place] Date [place]: 11/05/21 Time [place]: 1509 Representative spoke with at [place]: [place] ([place]) Interventions     Readmission Risk Interventions No flowsheet data found.

## 2021-11-12 DIAGNOSIS — R29898 Other symptoms and signs involving the musculoskeletal system: Secondary | ICD-10-CM | POA: Diagnosis not present

## 2021-11-12 DIAGNOSIS — N1832 Chronic kidney disease, stage 3b: Secondary | ICD-10-CM | POA: Diagnosis not present

## 2021-11-12 DIAGNOSIS — I10 Essential (primary) hypertension: Secondary | ICD-10-CM | POA: Diagnosis not present

## 2021-11-12 DIAGNOSIS — N183 Chronic kidney disease, stage 3 unspecified: Secondary | ICD-10-CM | POA: Diagnosis not present

## 2021-11-12 DIAGNOSIS — E1122 Type 2 diabetes mellitus with diabetic chronic kidney disease: Secondary | ICD-10-CM | POA: Diagnosis not present

## 2021-11-15 DIAGNOSIS — Z8781 Personal history of (healed) traumatic fracture: Secondary | ICD-10-CM | POA: Diagnosis not present

## 2021-11-16 DIAGNOSIS — F0390 Unspecified dementia without behavioral disturbance: Secondary | ICD-10-CM | POA: Diagnosis not present

## 2021-11-16 DIAGNOSIS — F332 Major depressive disorder, recurrent severe without psychotic features: Secondary | ICD-10-CM | POA: Diagnosis not present

## 2021-11-17 DIAGNOSIS — M2560 Stiffness of unspecified joint, not elsewhere classified: Secondary | ICD-10-CM | POA: Diagnosis not present

## 2021-11-17 DIAGNOSIS — Z8781 Personal history of (healed) traumatic fracture: Secondary | ICD-10-CM | POA: Diagnosis not present

## 2021-11-19 DIAGNOSIS — R29898 Other symptoms and signs involving the musculoskeletal system: Secondary | ICD-10-CM | POA: Diagnosis not present

## 2021-11-19 DIAGNOSIS — Z8781 Personal history of (healed) traumatic fracture: Secondary | ICD-10-CM | POA: Diagnosis not present

## 2021-11-22 DIAGNOSIS — Z8781 Personal history of (healed) traumatic fracture: Secondary | ICD-10-CM | POA: Diagnosis not present

## 2021-11-26 DIAGNOSIS — Z8781 Personal history of (healed) traumatic fracture: Secondary | ICD-10-CM | POA: Diagnosis not present

## 2021-11-29 DIAGNOSIS — Z8781 Personal history of (healed) traumatic fracture: Secondary | ICD-10-CM | POA: Diagnosis not present

## 2021-11-30 DIAGNOSIS — F03A0 Unspecified dementia, mild, without behavioral disturbance, psychotic disturbance, mood disturbance, and anxiety: Secondary | ICD-10-CM | POA: Diagnosis not present

## 2021-11-30 DIAGNOSIS — F332 Major depressive disorder, recurrent severe without psychotic features: Secondary | ICD-10-CM | POA: Diagnosis not present

## 2021-12-01 DIAGNOSIS — M2560 Stiffness of unspecified joint, not elsewhere classified: Secondary | ICD-10-CM | POA: Diagnosis not present

## 2021-12-01 DIAGNOSIS — Z8781 Personal history of (healed) traumatic fracture: Secondary | ICD-10-CM | POA: Diagnosis not present

## 2021-12-03 ENCOUNTER — Ambulatory Visit (INDEPENDENT_AMBULATORY_CARE_PROVIDER_SITE_OTHER): Payer: Medicare HMO | Admitting: Nurse Practitioner

## 2021-12-03 ENCOUNTER — Ambulatory Visit (INDEPENDENT_AMBULATORY_CARE_PROVIDER_SITE_OTHER): Payer: Medicare HMO

## 2021-12-03 ENCOUNTER — Other Ambulatory Visit: Payer: Self-pay

## 2021-12-03 ENCOUNTER — Encounter (INDEPENDENT_AMBULATORY_CARE_PROVIDER_SITE_OTHER): Payer: Self-pay | Admitting: Nurse Practitioner

## 2021-12-03 VITALS — BP 123/63 | HR 63 | Resp 16 | Ht 65.0 in | Wt 174.0 lb

## 2021-12-03 DIAGNOSIS — I10 Essential (primary) hypertension: Secondary | ICD-10-CM

## 2021-12-03 DIAGNOSIS — K551 Chronic vascular disorders of intestine: Secondary | ICD-10-CM

## 2021-12-03 DIAGNOSIS — E785 Hyperlipidemia, unspecified: Secondary | ICD-10-CM | POA: Diagnosis not present

## 2021-12-03 DIAGNOSIS — Z8781 Personal history of (healed) traumatic fracture: Secondary | ICD-10-CM | POA: Diagnosis not present

## 2021-12-06 ENCOUNTER — Encounter (INDEPENDENT_AMBULATORY_CARE_PROVIDER_SITE_OTHER): Payer: Self-pay | Admitting: Nurse Practitioner

## 2021-12-06 DIAGNOSIS — Z8781 Personal history of (healed) traumatic fracture: Secondary | ICD-10-CM | POA: Diagnosis not present

## 2021-12-06 NOTE — H&P (View-Only) (Signed)
? ?Subjective:  ? ? Patient ID: Patrick Turner, male    DOB: 08-19-1946, 76 y.o.   MRN: [id number] ?Chief Complaint  ?Patient presents with  ? Follow-up  ?  ultrasound  ? ? 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  The patient endorses having some postprandial abdominal pain recently however it has not progressed to having nausea or vomiting.  The patient does not substantiate food fear, particular foods do not seem to aggravate or alleviate the symptoms.  The patient denies bloody bowel movements or diarrhea.  He does note recent constipation. ?The patient has had previous intervention for mesenteric stenosis. ? ?The patient denies amaurosis fugax or recent TIA symptoms. There are no recent neurological changes noted. ?The patient denies claudication symptoms or rest pain symptoms. ?The patient denies history of DVT, PE or superficial thrombophlebitis. ?The patient denies recent episodes of angina  ? ? ?Today noninvasive symptoms note a 70 to 99% stenosis in the celiac artery and superior mesenteric artery.  The velocities for the superior mesenteric artery are nearly doubled from previous studies.  Celiac artery stenosis is fairly consistent with previous studies. ? ? ?Review of Systems  ?Gastrointestinal:  Positive for abdominal pain and constipation. Negative for diarrhea, nausea and vomiting.  ?All other systems reviewed and are negative. ? ?   ?Objective:  ? Physical Exam ?Vitals reviewed.  ?HENT:  ?   Head: Normocephalic.  ?Cardiovascular:  ?   Rate and Rhythm: Normal rate.  ?   Pulses: Normal pulses.  ?Pulmonary:  ?   Effort: Pulmonary effort is normal.  ?Abdominal:  ?   General: Abdomen is flat. Bowel sounds are normal.  ?Skin: ?   General: Skin is warm and dry.  ?Neurological:  ?   Mental Status: He is alert and oriented to person, place, and time.  ?   Gait: Gait abnormal.  ?Psychiatric:     ?   Mood  and Affect: Mood normal.     ?   Behavior: Behavior normal.     ?   Thought Content: Thought content normal.     ?   Judgment: Judgment normal.  ? ? ?BP 123/63 (BP Location: Left Arm)   Pulse 63   Resp 16   Ht '5\' 5"'$  (1.651 m)   Wt 174 lb (78.9 kg)   BMI 28.96 kg/m?  ? ?Past Medical History:  ?Diagnosis Date  ? Altered mental status   ? Anemia   ? Anxiety   ? Aortic atherosclerosis ([place])   ? Arthritis   ? Coronary artery disease   ? CRD (chronic renal disease)   ? Depression   ? Diabetes mellitus without complication ([place])   ? Encephalopathy acute   ? GERD (gastroesophageal reflux disease)   ? Headache   ? Hypercholesteremia   ? Hypertension   ? Neuropathy   ? Severe obesity ([place])   ? Sleep apnea   ? Sleep terror   ? per patient this year per patient   ? ? ?Social History  ? ?Socioeconomic History  ? Marital status: Married  ?  Spouse name: [name]  ? Number of children: 2  ? Years of education: Not on file  ? Highest education level: Associate degree: academic program  ?Occupational History  ? Occupation: works at [employer]  ?Tobacco Use  ? Smoking status: Never  ? Smokeless tobacco: Never  ?Vaping Use  ?  Vaping Use: Never used  ?Substance and Sexual Activity  ? Alcohol use: Yes  ?  Comment: rare  ? Drug use: No  ? Sexual activity: Not on file  ?Other Topics Concern  ? Not on file  ?Social History Narrative  ? Lives at home in private residence with wife  ? ?Social Determinants of Health  ? ?Financial Resource Strain: Not on file  ?Food Insecurity: Not on file  ?Transportation Needs: No Transportation Needs  ? Lack of Transportation (Medical): No  ? Lack of Transportation (Non-Medical): No  ?Physical Activity: Not on file  ?Stress: Not on file  ?Social Connections: Not on file  ?Intimate Partner Violence: Not on file  ? ? ?Past Surgical History:  ?Procedure Laterality Date  ? APPENDECTOMY    ? CIRCUMCISION    ? COLONOSCOPY    ? COLONOSCOPY WITH PROPOFOL N/A 07/10/2018  ? Procedure: COLONOSCOPY WITH  PROPOFOL;  Surgeon: Lollie Sails, MD;  Location: [facility] ENDOSCOPY;  Service: Endoscopy;  Laterality: N/A;  ? COLONOSCOPY WITH PROPOFOL N/A 04/23/2019  ? Procedure: COLONOSCOPY WITH PROPOFOL;  Surgeon: Lollie Sails, MD;  Location: [facility] ENDOSCOPY;  Service: Endoscopy;  Laterality: N/A;  ? CORONARY STENT INTERVENTION N/A 10/03/2019  ? Procedure: CORONARY STENT INTERVENTION;  Surgeon: Yolonda Kida, MD;  Location: [place] CV LAB;  Service: Cardiovascular;  Laterality: N/A;  ? ESOPHAGOGASTRODUODENOSCOPY    ? ESOPHAGOGASTRODUODENOSCOPY (EGD) WITH PROPOFOL N/A 04/23/2019  ? Procedure: ESOPHAGOGASTRODUODENOSCOPY (EGD) WITH PROPOFOL;  Surgeon: Lollie Sails, MD;  Location: [facility] END[facility]COPY;  Service: Endoscopy;  Laterality: N/A;  ? INTRAMEDULLARY (IM) NAIL INTERTROCHANTERIC Left 09/17/2021  ? Procedure: INTRAMEDULLARY (IM) NAIL INTERTROCHANTRIC;  Surgeon: Willaim Sheng, MD;  Location: [place];  Service: Orthopedics;  Laterality: Left;  ? JOINT REPLACEMENT    ? KNEE ARTHROPLASTY Right 06/27/2016  ? Procedure: COMPUTER ASSISTED TOTAL KNEE ARTHROPLASTY;  Surgeon: Dereck Leep, MD;  Location: [facility] ORS;  Service: Orthopedics;  Laterality: Right;  ? KNEE ARTHROSCOPY Right   ? LEFT HEART CATH AND CORONARY ANGIOGRAPHY N/A 10/03/2019  ? Procedure: Left Heart Cath and possible Coronary intervention;  Surgeon: Dionisio David, MD;  Location: [place] CV LAB;  Service: Cardiovascular;  Laterality: N/A;  ? TONSILLECTOMY    ? VISCERAL ANGIOGRAPHY N/A 05/23/2019  ? Procedure: VISCERAL ANGIOGRAPHY;  Surgeon: Algernon Huxley, MD;  Location: [place] CV LAB;  Service: Cardiovascular;  Laterality: N/A;  ? VISCERAL ANGIOGRAPHY N/A 11/04/2019  ? Procedure: VISCERAL ANGIOGRAPHY;  Surgeon: Algernon Huxley, MD;  Location: [place] CV LAB;  Service: Cardiovascular;  Laterality: N/A;  ? ? ?Family History  ?Problem Relation Age of Onset  ? Diabetes Mother   ? Heart disease Mother   ? Cancer Father   ? COPD Father    ? ? ?Allergies  ?Allergen Reactions  ? Penicillins Anaphylaxis  ?  Has patient had a PCN reaction causing immediate rash, facial/tongue/throat swelling, SOB or lightheadedness with hypotension: Yes ?Has patient had a PCN reaction causing severe rash involving mucus membranes or skin necrosis: No ?Has patient had a PCN reaction that required hospitalization Yes ?Has patient had a PCN reaction occurring within the last 10 years: No ?If all of the above answers are "NO", then may proceed with Cephalosporin use.  ? Tavist [Clemastine] Swelling  ? ? ? ?  Latest Ref Rng & Units 11/06/2021  ?  5:31 AM 11/04/2021  ? 11:33 AM 09/21/2021  ? 12:07 AM  ?CBC  ?WBC 4.0 - 10.5 K/uL  8.7   7.2   7.3    ?Hemoglobin 13.0 - 17.0 g/dL 10.8   11.3   8.3    ?Hematocrit 39.0 - 52.0 % 33.3   35.9   25.4    ?Platelets 150 - 400 K/uL 282   267   193    ? ? ? ? ?CMP  ?   ?Component Value Date/Time  ? NA 136 11/06/2021 0531  ? NA 139 11/24/2012 0716  ? K 4.3 11/06/2021 0531  ? K 4.4 11/24/2012 0716  ? CL 101 11/06/2021 0531  ? CL 107 11/24/2012 0716  ? CO2 24 11/06/2021 0531  ? CO2 29 11/24/2012 0716  ? GLUCOSE 132 (H) 11/06/2021 0531  ? GLUCOSE 180 (H) 11/24/2012 0716  ? BUN 34 (H) 11/06/2021 0531  ? BUN 20 (H) 11/24/2012 0716  ? CREATININE 1.50 (H) 11/06/2021 0531  ? CREATININE 1.27 11/24/2012 0716  ? CALCIUM 8.6 (L) 11/06/2021 0531  ? CALCIUM 8.4 (L) 11/24/2012 0716  ? PROT 6.6 11/04/2021 1133  ? PROT 6.6 11/23/2012 0231  ? ALBUMIN 3.6 11/04/2021 1133  ? ALBUMIN 3.2 (L) 11/23/2012 0231  ? AST 15 11/04/2021 1133  ? AST 38 (H) 11/23/2012 0231  ? ALT 11 11/04/2021 1133  ? ALT 25 11/23/2012 0231  ? ALKPHOS 97 11/04/2021 1133  ? ALKPHOS 71 11/23/2012 0231  ? BILITOT 0.5 11/04/2021 1133  ? BILITOT 0.2 11/23/2012 0231  ? GFRNONAA 48 (L) 11/06/2021 0531  ? GFRNONAA 58 (L) 11/24/2012 0716  ? GFRAA 57 (L) 03/21/2020 1914  ? GFRAA >60 11/24/2012 0716  ? ? ? ?No results found. ? ?   ?Assessment & Plan:  ? ?1. Chronic mesenteric ischemia  ([place]) ?Recommend: ? ?The patient has evidence of severe atherosclerotic changes of the mesenteric arteries associated with weight loss as well as abdominal pain.  This represents a high risk for bowel infarction and death. ? ?Patient shou

## 2021-12-06 NOTE — Progress Notes (Signed)
? ?Subjective:  ? ? Patient ID: Patrick Turner, male    DOB: 18-May-1946, 76 y.o.   MRN: [id number] ?Chief Complaint  ?Patient presents with  ? Follow-up  ?  ultrasound  ? ? 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  The patient endorses having some postprandial abdominal pain recently however it has not progressed to having nausea or vomiting.  The patient does not substantiate food fear, particular foods do not seem to aggravate or alleviate the symptoms.  The patient denies bloody bowel movements or diarrhea.  He does note recent constipation. ?The patient has had previous intervention for mesenteric stenosis. ? ?The patient denies amaurosis fugax or recent TIA symptoms. There are no recent neurological changes noted. ?The patient denies claudication symptoms or rest pain symptoms. ?The patient denies history of DVT, PE or superficial thrombophlebitis. ?The patient denies recent episodes of angina  ? ? ?Today noninvasive symptoms note a 70 to 99% stenosis in the celiac artery and superior mesenteric artery.  The velocities for the superior mesenteric artery are nearly doubled from previous studies.  Celiac artery stenosis is fairly consistent with previous studies. ? ? ?Review of Systems  ?Gastrointestinal:  Positive for abdominal pain and constipation. Negative for diarrhea, nausea and vomiting.  ?All other systems reviewed and are negative. ? ?   ?Objective:  ? Physical Exam ?Vitals reviewed.  ?HENT:  ?   Head: Normocephalic.  ?Cardiovascular:  ?   Rate and Rhythm: Normal rate.  ?   Pulses: Normal pulses.  ?Pulmonary:  ?   Effort: Pulmonary effort is normal.  ?Abdominal:  ?   General: Abdomen is flat. Bowel sounds are normal.  ?Skin: ?   General: Skin is warm and dry.  ?Neurological:  ?   Mental Status: He is alert and oriented to person, place, and time.  ?   Gait: Gait abnormal.  ?Psychiatric:     ?   Mood  and Affect: Mood normal.     ?   Behavior: Behavior normal.     ?   Thought Content: Thought content normal.     ?   Judgment: Judgment normal.  ? ? ?BP 123/63 (BP Location: Left Arm)   Pulse 63   Resp 16   Ht '5\' 5"'$  (1.651 m)   Wt 174 lb (78.9 kg)   BMI 28.96 kg/m?  ? ?Past Medical History:  ?Diagnosis Date  ? Altered mental status   ? Anemia   ? Anxiety   ? Aortic atherosclerosis ([place])   ? Arthritis   ? Coronary artery disease   ? CRD (chronic renal disease)   ? Depression   ? Diabetes mellitus without complication ([place])   ? Encephalopathy acute   ? GERD (gastroesophageal reflux disease)   ? Headache   ? Hypercholesteremia   ? Hypertension   ? Neuropathy   ? Severe obesity ([place])   ? Sleep apnea   ? Sleep terror   ? per patient this year per patient   ? ? ?Social History  ? ?Socioeconomic History  ? Marital status: Married  ?  Spouse name: [name]  ? Number of children: 2  ? Years of education: Not on file  ? Highest education level: Associate degree: academic program  ?Occupational History  ? Occupation: works at [employer] store  ?Tobacco Use  ? Smoking status: Never  ? Smokeless tobacco: Never  ?Vaping Use  ?  Vaping Use: Never used  ?Substance and Sexual Activity  ? Alcohol use: Yes  ?  Comment: rare  ? Drug use: No  ? Sexual activity: Not on file  ?Other Topics Concern  ? Not on file  ?Social History Narrative  ? Lives at home in private residence with wife  ? ?Social Determinants of Health  ? ?Financial Resource Strain: Not on file  ?Food Insecurity: Not on file  ?Transportation Needs: No Transportation Needs  ? Lack of Transportation (Medical): No  ? Lack of Transportation (Non-Medical): No  ?Physical Activity: Not on file  ?Stress: Not on file  ?Social Connections: Not on file  ?Intimate Partner Violence: Not on file  ? ? ?Past Surgical History:  ?Procedure Laterality Date  ? APPENDECTOMY    ? CIRCUMCISION    ? COLONOSCOPY    ? COLONOSCOPY WITH PROPOFOL N/A 07/10/2018  ? Procedure: COLONOSCOPY WITH  PROPOFOL;  Surgeon: Lollie Sails, MD;  Location[facility] ENDOSCOPY;  Service: Endoscopy;  Laterality: N/A;  ? COLONOSCOPY WITH PROPOFOL N/A 04/23/2019  ? Procedure: COLONOSCOPY WITH PROPOFOL;  Surgeon: Lollie Sails, MD;  Location: [facility] ENDOSCOPY;  Service: Endoscopy;  Laterality: N/A;  ? CORONARY STENT INTERVENTION N/A 10/03/2019  ? Procedure: CORONARY STENT INTERVENTION;  Surgeon: Yolonda Kida, MD;  Location: [place] CV LAB;  Service: Cardiovascular;  Laterality: N/A;  ? ESOPHAGOGASTRODUODENOSCOPY    ? ESOPHAGOGASTRODUODENOSCOPY (EGD) WITH PROPOFOL N/A 04/23/2019  ? Procedure: ESOPHAGOGASTRODUODENOSCOPY (EGD) WITH PROPOFOL;  Surgeon: Lollie Sails, MD;  Location: [facility] ENDOSCOPY;  Service: Endoscopy;  Laterality: N/A;  ? INTRAMEDULLARY (IM) NAIL INTERTROCHANTERIC Left 09/17/2021  ? Procedure: INTRAMEDULLARY (IM) NAIL INTERTROCHANTRIC;  Surgeon: Willaim Sheng, MD;  Location: [place];  Service: Orthopedics;  Laterality: Left;  ? JOINT REPLACEMENT    ? KNEE ARTHROPLASTY Right 06/27/2016  ? Procedure: COMPUTER ASSISTED TOTAL KNEE ARTHROPLASTY;  Surgeon: Dereck Leep, MD;  Location: [facility] ORS;  Service: Orthopedics;  Laterality: Right;  ? KNEE ARTHROSCOPY Right   ? LEFT HEART CATH AND CORONARY ANGIOGRAPHY N/A 10/03/2019  ? Procedure: Left Heart Cath and possible Coronary intervention;  Surgeon: Dionisio David, MD;  Location: [place] CV LAB;  Service: Cardiovascular;  Laterality: N/A;  ? TONSILLECTOMY    ? VISCERAL ANGIOGRAPHY N/A 05/23/2019  ? Procedure: VISCERAL ANGIOGRAPHY;  Surgeon: Algernon Huxley, MD;  Location: [place] CV LAB;  Service: Cardiovascular;  Laterality: N/A;  ? VISCERAL ANGIOGRAPHY N/A 11/04/2019  ? Procedure: VISCERAL ANGIOGRAPHY;  Surgeon: Algernon Huxley, MD;  Location: [place] CV LAB;  Service: Cardiovascular;  Laterality: N/A;  ? ? ?Family History  ?Problem Relation Age of Onset  ? Diabetes Mother   ? Heart disease Mother   ? Cancer Father   ? COPD Father    ? ? ?Allergies  ?Allergen Reactions  ? Penicillins Anaphylaxis  ?  Has patient had a PCN reaction causing immediate rash, facial/tongue/throat swelling, SOB or lightheadedness with hypotension: Yes ?Has patient had a PCN reaction causing severe rash involving mucus membranes or skin necrosis: No ?Has patient had a PCN reaction that required hospitalization Yes ?Has patient had a PCN reaction occurring within the last 10 years: No ?If all of the above answers are "NO", then may proceed with Cephalosporin use.  ? Tavist [Clemastine] Swelling  ? ? ? ?  Latest Ref Rng & Units 11/06/2021  ?  5:31 AM 11/04/2021  ? 11:33 AM 09/21/2021  ? 12:07 AM  ?CBC  ?WBC 4.0 - 10.5 K/uL  8.7   7.2   7.3    ?Hemoglobin 13.0 - 17.0 g/dL 10.8   11.3   8.3    ?Hematocrit 39.0 - 52.0 % 33.3   35.9   25.4    ?Platelets 150 - 400 K/uL 282   267   193    ? ? ? ? ?CMP  ?   ?Component Value Date/Time  ? NA 136 11/06/2021 0531  ? NA 139 11/24/2012 0716  ? K 4.3 11/06/2021 0531  ? K 4.4 11/24/2012 0716  ? CL 101 11/06/2021 0531  ? CL 107 11/24/2012 0716  ? CO2 24 11/06/2021 0531  ? CO2 29 11/24/2012 0716  ? GLUCOSE 132 (H) 11/06/2021 0531  ? GLUCOSE 180 (H) 11/24/2012 0716  ? BUN 34 (H) 11/06/2021 0531  ? BUN 20 (H) 11/24/2012 0716  ? CREATININE 1.50 (H) 11/06/2021 0531  ? CREATININE 1.27 11/24/2012 0716  ? CALCIUM 8.6 (L) 11/06/2021 0531  ? CALCIUM 8.4 (L) 11/24/2012 0716  ? PROT 6.6 11/04/2021 1133  ? PROT 6.6 11/23/2012 0231  ? ALBUMIN 3.6 11/04/2021 1133  ? ALBUMIN 3.2 (L) 11/23/2012 0231  ? AST 15 11/04/2021 1133  ? AST 38 (H) 11/23/2012 0231  ? ALT 11 11/04/2021 1133  ? ALT 25 11/23/2012 0231  ? ALKPHOS 97 11/04/2021 1133  ? ALKPHOS 71 11/23/2012 0231  ? BILITOT 0.5 11/04/2021 1133  ? BILITOT 0.2 11/23/2012 0231  ? GFRNONAA 48 (L) 11/06/2021 0531  ? GFRNONAA 58 (L) 11/24/2012 0716  ? GFRAA 57 (L) 03/21/2020 1914  ? GFRAA >60 11/24/2012 0716  ? ? ? ?No results found. ? ?   ?Assessment & Plan:  ? ?1. Chronic mesenteric ischemia  ([place]) ?Recommend: ? ?The patient has evidence of severe atherosclerotic changes of the mesenteric arteries associated with weight loss as well as abdominal pain.  This represents a high risk for bowel infarction and death. ? ?Patient shou

## 2021-12-08 DIAGNOSIS — Z8781 Personal history of (healed) traumatic fracture: Secondary | ICD-10-CM | POA: Diagnosis not present

## 2021-12-08 DIAGNOSIS — R29898 Other symptoms and signs involving the musculoskeletal system: Secondary | ICD-10-CM | POA: Diagnosis not present

## 2021-12-09 ENCOUNTER — Telehealth (INDEPENDENT_AMBULATORY_CARE_PROVIDER_SITE_OTHER): Payer: Self-pay

## 2021-12-09 DIAGNOSIS — I25118 Atherosclerotic heart disease of native coronary artery with other forms of angina pectoris: Secondary | ICD-10-CM | POA: Diagnosis not present

## 2021-12-09 DIAGNOSIS — E1122 Type 2 diabetes mellitus with diabetic chronic kidney disease: Secondary | ICD-10-CM | POA: Diagnosis not present

## 2021-12-09 DIAGNOSIS — I700 Atherosclerosis of aorta: Secondary | ICD-10-CM | POA: Diagnosis not present

## 2021-12-09 DIAGNOSIS — N183 Chronic kidney disease, stage 3 unspecified: Secondary | ICD-10-CM | POA: Diagnosis not present

## 2021-12-09 DIAGNOSIS — I779 Disorder of arteries and arterioles, unspecified: Secondary | ICD-10-CM | POA: Diagnosis not present

## 2021-12-09 NOTE — Telephone Encounter (Signed)
Spoke with the patient and he is scheduled with Dr. Lucky Cowboy for a mesenteric angio on 12/14/21 with a 11:00 am arrival time to the [facility]. Pre-procedure instructions were discussed and will be mailed. ?

## 2021-12-10 DIAGNOSIS — R29898 Other symptoms and signs involving the musculoskeletal system: Secondary | ICD-10-CM | POA: Diagnosis not present

## 2021-12-10 DIAGNOSIS — Z8781 Personal history of (healed) traumatic fracture: Secondary | ICD-10-CM | POA: Diagnosis not present

## 2021-12-13 ENCOUNTER — Ambulatory Visit: Payer: Self-pay

## 2021-12-13 DIAGNOSIS — S72142D Displaced intertrochanteric fracture of left femur, subsequent encounter for closed fracture with routine healing: Secondary | ICD-10-CM | POA: Diagnosis not present

## 2021-12-14 ENCOUNTER — Other Ambulatory Visit: Payer: Self-pay

## 2021-12-14 ENCOUNTER — Encounter: Payer: Self-pay | Admitting: Vascular Surgery

## 2021-12-14 ENCOUNTER — Encounter
Admission: RE | Disposition: A | Discharge status: Home / Self Care | Payer: Self-pay | Source: Home / Self Care | Attending: Vascular Surgery

## 2021-12-14 ENCOUNTER — Ambulatory Visit
Admission: RE | Admit: 2021-12-14 | Discharge: 2021-12-14 | Disposition: A | Discharge status: Home / Self Care | Payer: Medicare HMO | Attending: Vascular Surgery | Admitting: Vascular Surgery

## 2021-12-14 DIAGNOSIS — K551 Chronic vascular disorders of intestine: Secondary | ICD-10-CM

## 2021-12-14 DIAGNOSIS — E669 Obesity, unspecified: Secondary | ICD-10-CM | POA: Insufficient documentation

## 2021-12-14 DIAGNOSIS — N189 Chronic kidney disease, unspecified: Secondary | ICD-10-CM | POA: Insufficient documentation

## 2021-12-14 DIAGNOSIS — E1122 Type 2 diabetes mellitus with diabetic chronic kidney disease: Secondary | ICD-10-CM | POA: Insufficient documentation

## 2021-12-14 DIAGNOSIS — Z79899 Other long term (current) drug therapy: Secondary | ICD-10-CM | POA: Diagnosis not present

## 2021-12-14 DIAGNOSIS — Z7985 Long-term (current) use of injectable non-insulin antidiabetic drugs: Secondary | ICD-10-CM | POA: Diagnosis not present

## 2021-12-14 DIAGNOSIS — T82856A Stenosis of peripheral vascular stent, initial encounter: Secondary | ICD-10-CM | POA: Diagnosis not present

## 2021-12-14 DIAGNOSIS — I700 Atherosclerosis of aorta: Secondary | ICD-10-CM | POA: Diagnosis not present

## 2021-12-14 DIAGNOSIS — Z6828 Body mass index (BMI) 28.0-28.9, adult: Secondary | ICD-10-CM | POA: Diagnosis not present

## 2021-12-14 DIAGNOSIS — Y832 Surgical operation with anastomosis, bypass or graft as the cause of abnormal reaction of the patient, or of later complication, without mention of misadventure at the time of the procedure: Secondary | ICD-10-CM | POA: Diagnosis not present

## 2021-12-14 DIAGNOSIS — E1140 Type 2 diabetes mellitus with diabetic neuropathy, unspecified: Secondary | ICD-10-CM | POA: Diagnosis not present

## 2021-12-14 DIAGNOSIS — Z7982 Long term (current) use of aspirin: Secondary | ICD-10-CM | POA: Diagnosis not present

## 2021-12-14 DIAGNOSIS — I2510 Atherosclerotic heart disease of native coronary artery without angina pectoris: Secondary | ICD-10-CM | POA: Diagnosis not present

## 2021-12-14 DIAGNOSIS — I771 Stricture of artery: Secondary | ICD-10-CM | POA: Insufficient documentation

## 2021-12-14 DIAGNOSIS — K559 Vascular disorder of intestine, unspecified: Secondary | ICD-10-CM

## 2021-12-14 DIAGNOSIS — Z794 Long term (current) use of insulin: Secondary | ICD-10-CM | POA: Diagnosis not present

## 2021-12-14 DIAGNOSIS — I129 Hypertensive chronic kidney disease with stage 1 through stage 4 chronic kidney disease, or unspecified chronic kidney disease: Secondary | ICD-10-CM | POA: Insufficient documentation

## 2021-12-14 DIAGNOSIS — E785 Hyperlipidemia, unspecified: Secondary | ICD-10-CM | POA: Diagnosis not present

## 2021-12-14 HISTORY — PX: VISCERAL ANGIOGRAPHY: CATH118276

## 2021-12-14 LAB — CREATININE, SERUM
Creatinine, Ser: 1.32 mg/dL — ABNORMAL HIGH (ref 0.61–1.24)
GFR, Estimated: 56 mL/min — ABNORMAL LOW (ref >60)

## 2021-12-14 LAB — BUN: BUN: 35 mg/dL — ABNORMAL HIGH (ref 8–23)

## 2021-12-14 LAB — GLUCOSE, CAPILLARY
Glucose-Capillary: 102 mg/dL — ABNORMAL HIGH (ref 70–99)
Glucose-Capillary: 100 mg/dL — ABNORMAL HIGH (ref 70–99)

## 2021-12-14 SURGERY — VISCERAL ANGIOGRAPHY
Anesthesia: Moderate Sedation

## 2021-12-14 MED ORDER — MIDAZOLAM HCL 2 MG/2ML IJ SOLN
INTRAMUSCULAR | Status: DC | PRN
Start: 2021-12-14 — End: 2021-12-14
  Administered 2021-12-14: 2 mg via INTRAVENOUS

## 2021-12-14 MED ORDER — FENTANYL CITRATE (PF) 100 MCG/2ML IJ SOLN
INTRAMUSCULAR | Status: DC | PRN
Start: 2021-12-14 — End: 2021-12-14
  Administered 2021-12-14: 50 ug via INTRAVENOUS

## 2021-12-14 MED ORDER — FENTANYL CITRATE PF 50 MCG/ML IJ SOSY
PREFILLED_SYRINGE | INTRAMUSCULAR | Status: AC
  Filled 2021-12-14: qty 1

## 2021-12-14 MED ORDER — HEPARIN SODIUM (PORCINE) 1000 UNIT/ML IJ SOLN
INTRAMUSCULAR | Status: DC | PRN
  Administered 2021-12-14: 4000 [IU] via INTRAVENOUS

## 2021-12-14 MED ORDER — MIDAZOLAM HCL 2 MG/2ML IJ SOLN
INTRAMUSCULAR | Status: AC
  Filled 2021-12-14: qty 2

## 2021-12-14 MED ORDER — SODIUM CHLORIDE 0.9 % IV SOLN
INTRAVENOUS | Status: DC
  Administered 2021-12-14: 1000 mL via INTRAVENOUS

## 2021-12-14 MED ORDER — IODIXANOL 320 MG/ML IV SOLN
INTRAVENOUS | Status: DC | PRN
Start: 2021-12-14 — End: 2021-12-14
  Administered 2021-12-14: 40 mL via INTRA_ARTERIAL

## 2021-12-14 MED ORDER — METHYLPREDNISOLONE SODIUM SUCC 125 MG IJ SOLR
125.0000 mg | Freq: Once | INTRAMUSCULAR | Status: DC | PRN
Start: 2021-12-14 — End: 2021-12-14

## 2021-12-14 MED ORDER — CLINDAMYCIN PHOSPHATE 300 MG/50ML IV SOLN: 300.0000 mg | Freq: Once | INTRAVENOUS | Status: AC

## 2021-12-14 MED ORDER — HEPARIN SODIUM (PORCINE) 1000 UNIT/ML IJ SOLN
INTRAMUSCULAR | Status: AC
  Filled 2021-12-14: qty 10

## 2021-12-14 MED ORDER — ONDANSETRON HCL 4 MG/2ML IJ SOLN: 4.0000 mg | Freq: Four times a day (QID) | INTRAMUSCULAR | Status: DC | PRN

## 2021-12-14 MED ORDER — CLINDAMYCIN PHOSPHATE 300 MG/50ML IV SOLN
INTRAVENOUS | Status: AC
  Administered 2021-12-14: 300 mg via INTRAVENOUS
  Filled 2021-12-14: qty 50

## 2021-12-14 MED ORDER — DIPHENHYDRAMINE HCL 50 MG/ML IJ SOLN: 50.0000 mg | Freq: Once | INTRAMUSCULAR | Status: DC | PRN

## 2021-12-14 MED ORDER — FAMOTIDINE 20 MG PO TABS: 40.0000 mg | ORAL_TABLET | Freq: Once | ORAL | Status: DC | PRN

## 2021-12-14 MED ORDER — HYDROMORPHONE HCL 1 MG/ML IJ SOLN: 1.0000 mg | Freq: Once | INTRAMUSCULAR | Status: DC | PRN

## 2021-12-14 MED ORDER — MIDAZOLAM HCL 2 MG/ML PO SYRP: 8.0000 mg | ORAL_SOLUTION | Freq: Once | ORAL | Status: DC | PRN

## 2021-12-14 SURGICAL SUPPLY — 14 items
BALLN LUTONIX DCB 7X40X130 (BALLOONS) ×2
BALLOON LUTONIX DCB 7X40X130 (BALLOONS) IMPLANT
CATH ANGIO 5F PIGTAIL 65CM (CATHETERS) ×1 IMPLANT
CATH BEACON 5 .035 100 C2 TIP (CATHETERS) ×1 IMPLANT
CATH BEACON 5 .035 65 C2 TIP (CATHETERS) ×1 IMPLANT
COVER PROBE U/S 5X48 (MISCELLANEOUS) ×1 IMPLANT
DEVICE STARCLOSE SE CLOSURE (Vascular Products) ×1 IMPLANT
GLIDEWIRE ADV .035X260CM (WIRE) ×1 IMPLANT
KIT ENCORE 26 ADVANTAGE (KITS) ×1 IMPLANT
PACK ANGIOGRAPHY (CUSTOM PROCEDURE TRAY) ×2 IMPLANT
SHEATH BRITE TIP 5FRX11 (SHEATH) ×1 IMPLANT
SYR MEDRAD MARK 7 150ML (SYRINGE) ×1 IMPLANT
TUBING CONTRAST HIGH PRESS 72 (TUBING) ×1 IMPLANT
WIRE GUIDERIGHT .035X150 (WIRE) ×1 IMPLANT

## 2021-12-14 NOTE — Interval H&P Note (Signed)
History and Physical Interval Note: ? ?12/14/2021 ?11:43 AM ? ?Patrick Turner  has presented today for surgery, with the diagnosis of Mesenteric Angio   [name]   Mesenteric Ischemia.  The various methods of treatment have been discussed with the patient and family. After consideration of risks, benefits and other options for treatment, the patient has consented to  Procedure(s): ?VISCERAL ANGIOGRAPHY (N/A) as a surgical intervention.  The patient's history has been reviewed, patient examined, no change in status, stable for surgery.  I have reviewed the patient's chart and labs.  Questions were answered to the patient's satisfaction.   ? ? ?[name] ? ? ?

## 2021-12-14 NOTE — Op Note (Signed)
[facility] ? Percutaneous Study/Intervention Procedural Note ? ? ?Date: 12/14/2021 ? ?Surgeon(s): [name], MD ? ?Assistants: none ? ?Pre-operative Diagnosis: 1.  Chronic Mesenteric ischemia ?2. 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 ? ?Post-operative diagnosis:  Same ? ?Procedure(s) Performed: ?            1.  Ultrasound guidance for vascular access right femoral artery  ?            2.  Catheter placement into SMA from right femoral approach ?            3.  Aortogram and selective angiogram of the superior mesenteric artery ?            4.  PTA of the SMA with 7 mm diameter by 4 cm Lutonix drug-coated angioplasty balloon ?            5.  StarClose closure device right femoral artery ? ?Contrast: 40 cc ? ?Fluoro time: 3.1 minutes ? ?EBL: 5 ? ?Anesthesia: Approximately 24 minutes of Moderate conscious sedation using 2 mg of Versed and 50 mcg of Fentanyl ?             ?Indications:  Patient is a 76 y.o. male who has symptoms consistent with mesenteric ischemia. The patient has a duplex showing recurrent stenosis of the SMA stent and his symptoms have worsened over the past several months. The patient is brought in for angiography for further evaluation and potential treatment. Risks and benefits are discussed and informed consent is obtained ? ?Procedure:  The patient was identified and appropriate procedural time out was performed.  The patient was then placed supine on the table and prepped and draped in the usual sterile fashion. Moderate conscious sedation was administered during a face to face encounter with the patient throughout the procedure with my supervision of the RN administering medicines and monitoring the patient's vital signs, pulse oximetry, telemetry and mental status throughout from the start of the procedure until the patient was taken to the recovery room.  Ultrasound was used to evaluate the right common femoral artery.  It was patent .  A digital ultrasound image was  acquired.  A Seldinger needle was used to access the right common femoral artery under direct ultrasound guidance and a permanent image was performed.  A 0.035 J wire was advanced without resistance and a 5Fr sheath was placed.  Pigtail catheter was placed into the aorta and an AP aortogram was performed. This demonstrated fairly normal aorta and iliac arteries and good flow in both renal arteries. We transitioned to the lateral projection to image the celiac and SMA. 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  The patient was given 4000 units of IV heparin.  A CT catheter was used to selectively cannulate the SMA stent.  This demonstrated a 70 to 80% stenosis of the proximal to midportion of the previously placed SMA stent. Based on his symptoms and these findings, I elected to treat the SMA to try to improve the patient's clinical course. I crossed the lesion without difficulty with an advantage wire. I then used a 7 mm diameter x 4 mm length Lutonix drug-coated angioplasty balloon to perform percutaneous transluminal angioplasty of the SMA. I inflated the balloon to 14 atm and held the inflation for 1 minute. On completion angiogram following this angioplasty,  10-15% residual stenosis was identified. At this point, I elected to terminate the procedure. The diagnostic catheter was removed. StarClose closure device was deployed in usual fashion with excellent hemostatic result. The patient was taken to the recovery room in stable condition having tolerated the procedure well. ? ? ? ? ?Findings: 70 to 80% in-stent stenosis of the SMA stent treated with drug-coated balloon with a residual 10 to 15% stenosis ? ?Disposition: Patient was taken to the recovery room in stable condition having tolerated the procedure well. ? ?Complications:  None ? ?[name] ?12/14/2021 ?1:05 PM ? ? ?This note  was created with Dragon Medical transcription system. Any errors in dictation are purely unintentional.  ?   ?  ?  ?

## 2021-12-15 ENCOUNTER — Encounter: Payer: Self-pay | Admitting: Vascular Surgery

## 2021-12-16 ENCOUNTER — Other Ambulatory Visit: Payer: Self-pay

## 2021-12-16 NOTE — Patient Outreach (Signed)
[facility] [facility]) Care Management ? ?12/16/2021 ? ?Patrick Turner ?[birth year] ?[id number] ? ? ?Telephone call to patient for disease management follow up. Patient states he cannot talk right now. Advised patient that CM would call later this month. Patient agreeable.   ? ?Plan: RN CM will call later this month. ? ?Jone Baseman, RN, MSN ?[facility] Care Management ?Care Management Coordinator ?Direct Line [phone] ?Toll Free: [phone]  ?Fax: [phone] ? ?

## 2021-12-17 ENCOUNTER — Ambulatory Visit: Payer: Self-pay

## 2021-12-22 DIAGNOSIS — E1169 Type 2 diabetes mellitus with other specified complication: Secondary | ICD-10-CM | POA: Diagnosis not present

## 2021-12-22 DIAGNOSIS — E785 Hyperlipidemia, unspecified: Secondary | ICD-10-CM | POA: Diagnosis not present

## 2021-12-22 DIAGNOSIS — I129 Hypertensive chronic kidney disease with stage 1 through stage 4 chronic kidney disease, or unspecified chronic kidney disease: Secondary | ICD-10-CM | POA: Diagnosis not present

## 2021-12-22 DIAGNOSIS — N1832 Chronic kidney disease, stage 3b: Secondary | ICD-10-CM | POA: Diagnosis not present

## 2021-12-23 ENCOUNTER — Telehealth (INDEPENDENT_AMBULATORY_CARE_PROVIDER_SITE_OTHER): Payer: Self-pay

## 2021-12-23 NOTE — Telephone Encounter (Signed)
Patient informed that he had a several bowel movements since his procedure. The patient was made aware with medical advice and verbalized understanding ?

## 2021-12-23 NOTE — Telephone Encounter (Signed)
Sometimes it does get worse after due to some swelling in your abdomen.  Has he had a bowel movement since then? If not, he should try some miralax and colace to see if he can have a BM.  If he has we can try to move his f/u appt up  ?

## 2022-01-04 ENCOUNTER — Other Ambulatory Visit: Payer: Self-pay

## 2022-01-04 NOTE — Patient Outreach (Signed)
[facility]) Care Management ? ?01/04/2022 ? ?Patrick Turner ?1946-08-22 ?[id number] ? ? ?Telephone call to patient for disease management follow up.   No answer.  HIPAA compliant voice message left.   ? ?Plan: If no return call, RN CM will attempt patient again in May. ?RN CM will send unsuccessful letter. ? ?Jone Baseman, RN, MSN ?[facility] Care Management ?Care Management Coordinator ?Direct Line [phone] ?Toll Free: [phone]  ?Fax: [phone] ? ?

## 2022-01-13 ENCOUNTER — Other Ambulatory Visit: Payer: Self-pay

## 2022-01-13 NOTE — Patient Outreach (Signed)
[place] [facility]) Care Management ? ?01/13/2022 ? ?Patrick Turner ?1945-11-24 ?[id number] ? ? ?Telephone call to patient for disease management follow up. Patient reports he is doing ok.  He suffered a hip fracture earlier this year. 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  He states he is getting a shoe lift soon to help with that.  Patient reports blood sugars ranging 80-125.  Encouraged to continue diabetes management. Patient still working as he enjoys his job.  No concerns.  ? ?Care Plan : Hypertension (Adult)  ?Updates made by Jon Billings, RN since 01/13/2022 12:00 AM  ?Completed 01/13/2022  ? ?Problem: Hypertension (Hypertension) Resolved 01/13/2022  ?Priority: High  ?Onset Date: 06/18/2020  ?  ? ?Problem: Disease Progression (Hypertension) Resolved 01/13/2022  ?Priority: High  ?Onset Date: 06/18/2020  ?  ? ?Problem: Resistant Hypertension (Hypertension) Resolved 01/13/2022  ?Priority: High  ?Onset Date: 06/18/2020  ?  ? ?Care Plan : RN CM Plan of Care  ?Updates made by Jon Billings, RN since 01/13/2022 12:00 AM  ?  ? ?Problem: Chroinc Disease Mangement and Care Coordination Needs Diabetes   ?Priority: High  ?  ? 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 ?RNCM Clinical Goal(s):  ?Patient will verbalize basic understanding of  DMII disease process and self health management plan as evidenced by A1c less than 7 ?continue to work with RN Care Manager to address care management and care coordination needs related to  DMII as evidenced by adherence to CM Team Scheduled appointments through collaboration with RN Care manager, provider, and care team.  ? ?Interventions: ?Education and support related to diabetes ?Inter-disciplinary care team collaboration (see longitudinal  plan of care) ?Evaluation of current treatment plan related to  self management and patient's adherence to plan as established by provider ? ? ?Diabetes Interventions:  (Status:  Goal on track:  Yes.) Long Term Goal ?Assessed patient's understanding of A1c goal: <7% ?Provided education to patient about basic DM disease process ?Discussed plans with patient for ongoing care management follow up and provided patient with direct contact information for care management team ?Lab Results  ?Component Value Date  ? HGBA1C 7.6 (H) 09/17/2021  ? ?Patient Goals/Self-Care Activities: Diabetes ?Take all medications as prescribed ?Attend all scheduled provider appointments ?check blood sugar at prescribed times: once daily ?check feet daily for cuts, sores or redness ?enter blood sugar readings and medication or insulin into daily log ?take the blood sugar log to all doctor visits ?manage portion size ?prepare main meal at home 3 to 5 days each week ? ?01/13/22 Patient has done good with his diabetes management. A1c 6.9.  Encouraged continued diabetes management. ? ?   ?Follow Up Plan:  Telephone follow up appointment with care management team member scheduled for:  July ?The patient has been provided with contact information for the care management team and has been advised to call with any health related questions or concerns.  ? ?  ? ?Plan: Follow-up: Patient agrees to Care Plan and Follow-up. ?Follow-up in July.   ? ?Jone Baseman, RN, MSN ?[facility] Care Management ?Care Management Coordinator ?Direct Line [phone] ?Toll Free: [phone]  ?Fax: [phone] ? ?

## 2022-01-13 NOTE — Patient Instructions (Signed)
Patient Goals/Self-Care Activities: Diabetes Take all medications as prescribed Attend all scheduled provider appointments check blood sugar at prescribed times: once daily check feet daily for cuts, sores or redness enter blood sugar readings and medication or insulin into daily log take the blood sugar log to all doctor visits manage portion size prepare main meal at home 3 to 5 days each week 

## 2022-01-17 ENCOUNTER — Other Ambulatory Visit (INDEPENDENT_AMBULATORY_CARE_PROVIDER_SITE_OTHER): Payer: Self-pay | Admitting: Vascular Surgery

## 2022-01-17 DIAGNOSIS — K551 Chronic vascular disorders of intestine: Secondary | ICD-10-CM

## 2022-01-18 ENCOUNTER — Encounter (INDEPENDENT_AMBULATORY_CARE_PROVIDER_SITE_OTHER): Payer: Medicare HMO

## 2022-01-18 ENCOUNTER — Encounter (INDEPENDENT_AMBULATORY_CARE_PROVIDER_SITE_OTHER): Payer: Medicare HMO | Admitting: Vascular Surgery

## 2022-01-25 ENCOUNTER — Other Ambulatory Visit: Payer: Self-pay | Admitting: Orthopedic Surgery

## 2022-01-25 DIAGNOSIS — S7292XK Unspecified fracture of left femur, subsequent encounter for closed fracture with nonunion: Secondary | ICD-10-CM

## 2022-01-25 DIAGNOSIS — S72142D Displaced intertrochanteric fracture of left femur, subsequent encounter for closed fracture with routine healing: Secondary | ICD-10-CM | POA: Diagnosis not present

## 2022-01-31 ENCOUNTER — Ambulatory Visit
Admission: RE | Admit: 2022-01-31 | Discharge: 2022-01-31 | Disposition: A | Discharge status: Home / Self Care | Payer: Medicare HMO | Source: Ambulatory Visit | Attending: Orthopedic Surgery | Admitting: Orthopedic Surgery

## 2022-01-31 DIAGNOSIS — S7292XK Unspecified fracture of left femur, subsequent encounter for closed fracture with nonunion: Secondary | ICD-10-CM

## 2022-01-31 DIAGNOSIS — S72142A Displaced intertrochanteric fracture of left femur, initial encounter for closed fracture: Secondary | ICD-10-CM | POA: Diagnosis not present

## 2022-02-03 ENCOUNTER — Ambulatory Visit: Payer: Self-pay

## 2022-02-03 DIAGNOSIS — S72142D Displaced intertrochanteric fracture of left femur, subsequent encounter for closed fracture with routine healing: Secondary | ICD-10-CM | POA: Diagnosis not present

## 2022-02-08 DIAGNOSIS — F332 Major depressive disorder, recurrent severe without psychotic features: Secondary | ICD-10-CM | POA: Diagnosis not present

## 2022-02-08 DIAGNOSIS — F319 Bipolar disorder, unspecified: Secondary | ICD-10-CM | POA: Diagnosis not present

## 2022-02-08 DIAGNOSIS — F0390 Unspecified dementia without behavioral disturbance: Secondary | ICD-10-CM | POA: Diagnosis not present

## 2022-02-10 DIAGNOSIS — I700 Atherosclerosis of aorta: Secondary | ICD-10-CM | POA: Diagnosis not present

## 2022-02-10 DIAGNOSIS — N183 Chronic kidney disease, stage 3 unspecified: Secondary | ICD-10-CM | POA: Diagnosis not present

## 2022-02-10 DIAGNOSIS — I25118 Atherosclerotic heart disease of native coronary artery with other forms of angina pectoris: Secondary | ICD-10-CM | POA: Diagnosis not present

## 2022-02-10 DIAGNOSIS — E1122 Type 2 diabetes mellitus with diabetic chronic kidney disease: Secondary | ICD-10-CM | POA: Diagnosis not present

## 2022-02-16 DIAGNOSIS — E109 Type 1 diabetes mellitus without complications: Secondary | ICD-10-CM | POA: Diagnosis not present

## 2022-02-16 DIAGNOSIS — E7800 Pure hypercholesterolemia, unspecified: Secondary | ICD-10-CM | POA: Diagnosis not present

## 2022-02-16 DIAGNOSIS — I10 Essential (primary) hypertension: Secondary | ICD-10-CM | POA: Diagnosis not present

## 2022-02-16 DIAGNOSIS — Z0100 Encounter for examination of eyes and vision without abnormal findings: Secondary | ICD-10-CM | POA: Diagnosis not present

## 2022-02-17 DIAGNOSIS — I779 Disorder of arteries and arterioles, unspecified: Secondary | ICD-10-CM | POA: Diagnosis not present

## 2022-02-17 DIAGNOSIS — E113393 Type 2 diabetes mellitus with moderate nonproliferative diabetic retinopathy without macular edema, bilateral: Secondary | ICD-10-CM | POA: Diagnosis not present

## 2022-02-17 DIAGNOSIS — N1832 Chronic kidney disease, stage 3b: Secondary | ICD-10-CM | POA: Diagnosis not present

## 2022-02-17 DIAGNOSIS — E1122 Type 2 diabetes mellitus with diabetic chronic kidney disease: Secondary | ICD-10-CM | POA: Diagnosis not present

## 2022-02-17 DIAGNOSIS — I129 Hypertensive chronic kidney disease with stage 1 through stage 4 chronic kidney disease, or unspecified chronic kidney disease: Secondary | ICD-10-CM | POA: Diagnosis not present

## 2022-02-17 DIAGNOSIS — Z0000 Encounter for general adult medical examination without abnormal findings: Secondary | ICD-10-CM | POA: Diagnosis not present

## 2022-02-17 DIAGNOSIS — I25118 Atherosclerotic heart disease of native coronary artery with other forms of angina pectoris: Secondary | ICD-10-CM | POA: Diagnosis not present

## 2022-02-17 DIAGNOSIS — Z1389 Encounter for screening for other disorder: Secondary | ICD-10-CM | POA: Diagnosis not present

## 2022-02-17 DIAGNOSIS — F325 Major depressive disorder, single episode, in full remission: Secondary | ICD-10-CM | POA: Diagnosis not present

## 2022-02-25 DIAGNOSIS — E113312 Type 2 diabetes mellitus with moderate nonproliferative diabetic retinopathy with macular edema, left eye: Secondary | ICD-10-CM | POA: Diagnosis not present

## 2022-02-28 DIAGNOSIS — Z0100 Encounter for examination of eyes and vision without abnormal findings: Secondary | ICD-10-CM | POA: Diagnosis not present

## 2022-03-01 DIAGNOSIS — I2510 Atherosclerotic heart disease of native coronary artery without angina pectoris: Secondary | ICD-10-CM | POA: Diagnosis not present

## 2022-03-01 DIAGNOSIS — E782 Mixed hyperlipidemia: Secondary | ICD-10-CM | POA: Diagnosis not present

## 2022-03-01 DIAGNOSIS — I351 Nonrheumatic aortic (valve) insufficiency: Secondary | ICD-10-CM | POA: Diagnosis not present

## 2022-03-01 DIAGNOSIS — I10 Essential (primary) hypertension: Secondary | ICD-10-CM | POA: Diagnosis not present

## 2022-03-01 DIAGNOSIS — I340 Nonrheumatic mitral (valve) insufficiency: Secondary | ICD-10-CM | POA: Diagnosis not present

## 2022-03-01 DIAGNOSIS — R079 Chest pain, unspecified: Secondary | ICD-10-CM | POA: Diagnosis not present

## 2022-03-01 DIAGNOSIS — E669 Obesity, unspecified: Secondary | ICD-10-CM | POA: Diagnosis not present

## 2022-03-01 DIAGNOSIS — K219 Gastro-esophageal reflux disease without esophagitis: Secondary | ICD-10-CM | POA: Diagnosis not present

## 2022-03-04 ENCOUNTER — Encounter (INDEPENDENT_AMBULATORY_CARE_PROVIDER_SITE_OTHER): Payer: Medicare HMO | Admitting: Vascular Surgery

## 2022-03-04 ENCOUNTER — Encounter (INDEPENDENT_AMBULATORY_CARE_PROVIDER_SITE_OTHER): Payer: Medicare HMO

## 2022-03-04 DIAGNOSIS — F332 Major depressive disorder, recurrent severe without psychotic features: Secondary | ICD-10-CM | POA: Diagnosis not present

## 2022-03-07 DIAGNOSIS — E113393 Type 2 diabetes mellitus with moderate nonproliferative diabetic retinopathy without macular edema, bilateral: Secondary | ICD-10-CM | POA: Diagnosis not present

## 2022-03-07 DIAGNOSIS — E1122 Type 2 diabetes mellitus with diabetic chronic kidney disease: Secondary | ICD-10-CM | POA: Diagnosis not present

## 2022-03-07 DIAGNOSIS — I10 Essential (primary) hypertension: Secondary | ICD-10-CM | POA: Diagnosis not present

## 2022-03-07 DIAGNOSIS — N183 Chronic kidney disease, stage 3 unspecified: Secondary | ICD-10-CM | POA: Diagnosis not present

## 2022-03-07 DIAGNOSIS — I509 Heart failure, unspecified: Secondary | ICD-10-CM | POA: Diagnosis not present

## 2022-03-07 DIAGNOSIS — E1142 Type 2 diabetes mellitus with diabetic polyneuropathy: Secondary | ICD-10-CM | POA: Diagnosis not present

## 2022-03-07 DIAGNOSIS — M818 Other osteoporosis without current pathological fracture: Secondary | ICD-10-CM | POA: Diagnosis not present

## 2022-03-07 DIAGNOSIS — E1159 Type 2 diabetes mellitus with other circulatory complications: Secondary | ICD-10-CM | POA: Diagnosis not present

## 2022-03-07 DIAGNOSIS — Z8781 Personal history of (healed) traumatic fracture: Secondary | ICD-10-CM | POA: Diagnosis not present

## 2022-03-07 DIAGNOSIS — I110 Hypertensive heart disease with heart failure: Secondary | ICD-10-CM | POA: Diagnosis not present

## 2022-03-07 DIAGNOSIS — Z794 Long term (current) use of insulin: Secondary | ICD-10-CM | POA: Diagnosis not present

## 2022-03-08 DIAGNOSIS — S72142D Displaced intertrochanteric fracture of left femur, subsequent encounter for closed fracture with routine healing: Secondary | ICD-10-CM | POA: Diagnosis not present

## 2022-03-09 DIAGNOSIS — E119 Type 2 diabetes mellitus without complications: Secondary | ICD-10-CM | POA: Diagnosis not present

## 2022-03-10 ENCOUNTER — Ambulatory Visit (INDEPENDENT_AMBULATORY_CARE_PROVIDER_SITE_OTHER): Payer: Medicare HMO

## 2022-03-10 ENCOUNTER — Ambulatory Visit (INDEPENDENT_AMBULATORY_CARE_PROVIDER_SITE_OTHER): Payer: Medicare HMO | Admitting: Nurse Practitioner

## 2022-03-10 DIAGNOSIS — K551 Chronic vascular disorders of intestine: Secondary | ICD-10-CM

## 2022-03-11 DIAGNOSIS — R079 Chest pain, unspecified: Secondary | ICD-10-CM | POA: Diagnosis not present

## 2022-03-16 DIAGNOSIS — K219 Gastro-esophageal reflux disease without esophagitis: Secondary | ICD-10-CM | POA: Diagnosis not present

## 2022-03-16 DIAGNOSIS — I10 Essential (primary) hypertension: Secondary | ICD-10-CM | POA: Diagnosis not present

## 2022-03-16 DIAGNOSIS — R0602 Shortness of breath: Secondary | ICD-10-CM | POA: Diagnosis not present

## 2022-03-16 DIAGNOSIS — I340 Nonrheumatic mitral (valve) insufficiency: Secondary | ICD-10-CM | POA: Diagnosis not present

## 2022-03-16 DIAGNOSIS — I2510 Atherosclerotic heart disease of native coronary artery without angina pectoris: Secondary | ICD-10-CM | POA: Diagnosis not present

## 2022-03-16 DIAGNOSIS — E782 Mixed hyperlipidemia: Secondary | ICD-10-CM | POA: Diagnosis not present

## 2022-03-16 DIAGNOSIS — I351 Nonrheumatic aortic (valve) insufficiency: Secondary | ICD-10-CM | POA: Diagnosis not present

## 2022-03-17 ENCOUNTER — Other Ambulatory Visit: Payer: Self-pay

## 2022-03-17 NOTE — Patient Outreach (Signed)
[facility]) Care Management  03/17/2022  Patrick Turner [birth year] [id number]   Patient reports he is doing better with his hip.  To start exercise at the [facility] this week.  A1c up at 7.5.  Discussed diabetes management and exercise. No concerns.    Care Plan : Diabetes Type 2 (Adult)  Updates made by Jon Billings, RN since 03/17/2022 12:00 AM     Problem: Glycemic Management (Diabetes, Type 2) Resolved 03/17/2022  Priority: High  Onset Date: 06/18/2020     Problem: Disease Progression (Diabetes, Type 2) Resolved 03/17/2022  Priority: High  Onset Date: 06/18/2020     Care Plan : RN CM Plan of Care  Updates made by Jon Billings, RN since 03/17/2022 12:00 AM     Problem: Chroinc Disease Mangement and Care Coordination Needs Diabetes   Priority: High     Long-Range Goal: Development of Plan of Care for Management of Diabetes   Start Date: 01/13/2022  Expected End Date: 09/11/2022  This Visit's Progress: Not on track  Note:   Current Barriers:  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   Interventions: Education and support related to diabetes Inter-disciplinary care team collaboration (see longitudinal plan of care) Evaluation of current treatment plan related to  self management and patient's adherence to plan as established by provider   Diabetes Interventions:  (Status:  Goal on track:  Yes.) Long Term Goal Assessed patient's understanding of A1c goal: <7% Provided education to patient about basic DM disease process Discussed plans with  patient for ongoing care management follow up and provided patient with direct contact information for care management team Lab Results  Component Value Date   HGBA1C 7.6 (H) 09/17/2021   Patient Goals/Self-Care Activities: Diabetes Take all medications as prescribed Attend all scheduled provider appointments check blood sugar at prescribed times: once daily check feet daily for cuts, sores or redness enter blood sugar readings and medication or insulin into daily log take the blood sugar log to all doctor visits manage portion size prepare main meal at home 3 to 5 days each week  01/13/22 Patient has done good with his diabetes management. A1c 6.9.  Encouraged continued diabetes management.  03/17/22 Patient reports he is doing better.  However, A1c is 7.5.  He will be starting at the [facility] this week for exercise.  Discussed how exercise helps with managing diabetes.  He verbalized understanding. Patient now also has a libre to monitor his blood sugars.  Discussed how these devices help with diabetes management.  He verbalized understanding.       Follow Up Plan:  Telephone follow up appointment with care management team member scheduled for:  September The patient has been provided with contact information for the care management team and has been advised to call with any health related questions or concerns.      Plan: Follow-up: Patient agrees to Care Plan and Follow-up. Follow-up in September.  Jone Baseman, RN, MSN [facility] Care Management Care Management Coordinator Direct Line [phone] Toll Free: [phone]  Fax: [phone]

## 2022-03-17 NOTE — Patient Instructions (Signed)
Patient Goals/Self-Care Activities: Diabetes Take all medications as prescribed Attend all scheduled provider appointments check blood sugar at prescribed times: once daily check feet daily for cuts, sores or redness enter blood sugar readings and medication or insulin into daily log take the blood sugar log to all doctor visits manage portion size prepare main meal at home 3 to 5 days each week

## 2022-03-26 ENCOUNTER — Encounter (INDEPENDENT_AMBULATORY_CARE_PROVIDER_SITE_OTHER): Payer: Self-pay | Admitting: Nurse Practitioner

## 2022-03-26 NOTE — Progress Notes (Signed)
Patient left prior to being seen however was contacted to discuss ultrasound results.  The patient had improvement of his symptoms postintervention.  His velocities were improved post angioplasty of his SMA.  We will have the patient return in 3 months for follow-up studies.

## 2022-03-28 DIAGNOSIS — L600 Ingrowing nail: Secondary | ICD-10-CM | POA: Diagnosis not present

## 2022-03-28 DIAGNOSIS — M79674 Pain in right toe(s): Secondary | ICD-10-CM | POA: Diagnosis not present

## 2022-03-28 DIAGNOSIS — M79675 Pain in left toe(s): Secondary | ICD-10-CM | POA: Diagnosis not present

## 2022-03-28 DIAGNOSIS — B351 Tinea unguium: Secondary | ICD-10-CM | POA: Diagnosis not present

## 2022-04-06 ENCOUNTER — Other Ambulatory Visit: Payer: Self-pay

## 2022-04-06 NOTE — Patient Outreach (Signed)
[place] [facility]) Care Management  04/06/2022  Patrick Turner 02/09/1946 [id number]   Telephone call to patient.  Advised that case will be closed as patient is meeting goals.   RN CM will close case.  Jone Baseman, RN, MSN [facility] Care Management Care Management Coordinator Direct Line [phone] Toll Free: [phone]  Fax: [phone]

## 2022-04-07 DIAGNOSIS — I129 Hypertensive chronic kidney disease with stage 1 through stage 4 chronic kidney disease, or unspecified chronic kidney disease: Secondary | ICD-10-CM | POA: Diagnosis not present

## 2022-04-07 DIAGNOSIS — E1122 Type 2 diabetes mellitus with diabetic chronic kidney disease: Secondary | ICD-10-CM | POA: Diagnosis not present

## 2022-04-07 DIAGNOSIS — D631 Anemia in chronic kidney disease: Secondary | ICD-10-CM | POA: Diagnosis not present

## 2022-04-07 DIAGNOSIS — N1831 Chronic kidney disease, stage 3a: Secondary | ICD-10-CM | POA: Diagnosis not present

## 2022-04-07 DIAGNOSIS — R809 Proteinuria, unspecified: Secondary | ICD-10-CM | POA: Diagnosis not present

## 2022-04-07 DIAGNOSIS — E875 Hyperkalemia: Secondary | ICD-10-CM | POA: Diagnosis not present

## 2022-04-08 DIAGNOSIS — E119 Type 2 diabetes mellitus without complications: Secondary | ICD-10-CM | POA: Diagnosis not present

## 2022-04-12 DIAGNOSIS — E1122 Type 2 diabetes mellitus with diabetic chronic kidney disease: Secondary | ICD-10-CM | POA: Diagnosis not present

## 2022-04-12 DIAGNOSIS — N1832 Chronic kidney disease, stage 3b: Secondary | ICD-10-CM | POA: Diagnosis not present

## 2022-04-12 DIAGNOSIS — R809 Proteinuria, unspecified: Secondary | ICD-10-CM | POA: Diagnosis not present

## 2022-04-12 DIAGNOSIS — I10 Essential (primary) hypertension: Secondary | ICD-10-CM | POA: Diagnosis not present

## 2022-04-12 DIAGNOSIS — N2581 Secondary hyperparathyroidism of renal origin: Secondary | ICD-10-CM | POA: Diagnosis not present

## 2022-04-12 DIAGNOSIS — D631 Anemia in chronic kidney disease: Secondary | ICD-10-CM | POA: Diagnosis not present

## 2022-04-29 DIAGNOSIS — F0390 Unspecified dementia without behavioral disturbance: Secondary | ICD-10-CM | POA: Diagnosis not present

## 2022-04-29 DIAGNOSIS — F332 Major depressive disorder, recurrent severe without psychotic features: Secondary | ICD-10-CM | POA: Diagnosis not present

## 2022-05-09 DIAGNOSIS — E119 Type 2 diabetes mellitus without complications: Secondary | ICD-10-CM | POA: Diagnosis not present

## 2022-05-20 DIAGNOSIS — E113313 Type 2 diabetes mellitus with moderate nonproliferative diabetic retinopathy with macular edema, bilateral: Secondary | ICD-10-CM | POA: Diagnosis not present

## 2022-05-20 DIAGNOSIS — E113312 Type 2 diabetes mellitus with moderate nonproliferative diabetic retinopathy with macular edema, left eye: Secondary | ICD-10-CM | POA: Diagnosis not present

## 2022-05-20 DIAGNOSIS — E113311 Type 2 diabetes mellitus with moderate nonproliferative diabetic retinopathy with macular edema, right eye: Secondary | ICD-10-CM | POA: Diagnosis not present

## 2022-05-25 ENCOUNTER — Ambulatory Visit: Payer: Self-pay

## 2022-06-07 DIAGNOSIS — S72142D Displaced intertrochanteric fracture of left femur, subsequent encounter for closed fracture with routine healing: Secondary | ICD-10-CM | POA: Diagnosis not present

## 2022-06-09 DIAGNOSIS — E119 Type 2 diabetes mellitus without complications: Secondary | ICD-10-CM | POA: Diagnosis not present

## 2022-06-13 DIAGNOSIS — I129 Hypertensive chronic kidney disease with stage 1 through stage 4 chronic kidney disease, or unspecified chronic kidney disease: Secondary | ICD-10-CM | POA: Diagnosis not present

## 2022-06-13 DIAGNOSIS — E113393 Type 2 diabetes mellitus with moderate nonproliferative diabetic retinopathy without macular edema, bilateral: Secondary | ICD-10-CM | POA: Diagnosis not present

## 2022-06-13 DIAGNOSIS — Z794 Long term (current) use of insulin: Secondary | ICD-10-CM | POA: Diagnosis not present

## 2022-06-13 DIAGNOSIS — N1832 Chronic kidney disease, stage 3b: Secondary | ICD-10-CM | POA: Diagnosis not present

## 2022-06-16 DIAGNOSIS — M25659 Stiffness of unspecified hip, not elsewhere classified: Secondary | ICD-10-CM | POA: Diagnosis not present

## 2022-06-16 DIAGNOSIS — R269 Unspecified abnormalities of gait and mobility: Secondary | ICD-10-CM | POA: Diagnosis not present

## 2022-06-16 DIAGNOSIS — M25552 Pain in left hip: Secondary | ICD-10-CM | POA: Diagnosis not present

## 2022-06-16 DIAGNOSIS — R29898 Other symptoms and signs involving the musculoskeletal system: Secondary | ICD-10-CM | POA: Diagnosis not present

## 2022-06-17 ENCOUNTER — Other Ambulatory Visit (INDEPENDENT_AMBULATORY_CARE_PROVIDER_SITE_OTHER): Payer: Self-pay | Admitting: Nurse Practitioner

## 2022-06-17 DIAGNOSIS — E782 Mixed hyperlipidemia: Secondary | ICD-10-CM | POA: Diagnosis not present

## 2022-06-17 DIAGNOSIS — I351 Nonrheumatic aortic (valve) insufficiency: Secondary | ICD-10-CM | POA: Diagnosis not present

## 2022-06-17 DIAGNOSIS — I340 Nonrheumatic mitral (valve) insufficiency: Secondary | ICD-10-CM | POA: Diagnosis not present

## 2022-06-17 DIAGNOSIS — I10 Essential (primary) hypertension: Secondary | ICD-10-CM | POA: Diagnosis not present

## 2022-06-17 DIAGNOSIS — R55 Syncope and collapse: Secondary | ICD-10-CM | POA: Diagnosis not present

## 2022-06-17 DIAGNOSIS — I2510 Atherosclerotic heart disease of native coronary artery without angina pectoris: Secondary | ICD-10-CM | POA: Diagnosis not present

## 2022-06-17 DIAGNOSIS — K219 Gastro-esophageal reflux disease without esophagitis: Secondary | ICD-10-CM | POA: Diagnosis not present

## 2022-06-17 DIAGNOSIS — K551 Chronic vascular disorders of intestine: Secondary | ICD-10-CM

## 2022-06-17 DIAGNOSIS — E669 Obesity, unspecified: Secondary | ICD-10-CM | POA: Diagnosis not present

## 2022-06-20 ENCOUNTER — Ambulatory Visit (INDEPENDENT_AMBULATORY_CARE_PROVIDER_SITE_OTHER): Payer: Medicare HMO | Admitting: Nurse Practitioner

## 2022-06-20 ENCOUNTER — Encounter (INDEPENDENT_AMBULATORY_CARE_PROVIDER_SITE_OTHER): Payer: Self-pay

## 2022-06-20 ENCOUNTER — Ambulatory Visit (INDEPENDENT_AMBULATORY_CARE_PROVIDER_SITE_OTHER): Payer: Medicare HMO

## 2022-06-20 DIAGNOSIS — E1122 Type 2 diabetes mellitus with diabetic chronic kidney disease: Secondary | ICD-10-CM | POA: Diagnosis not present

## 2022-06-20 DIAGNOSIS — I700 Atherosclerosis of aorta: Secondary | ICD-10-CM | POA: Diagnosis not present

## 2022-06-20 DIAGNOSIS — N183 Chronic kidney disease, stage 3 unspecified: Secondary | ICD-10-CM | POA: Diagnosis not present

## 2022-06-20 DIAGNOSIS — I779 Disorder of arteries and arterioles, unspecified: Secondary | ICD-10-CM | POA: Diagnosis not present

## 2022-06-20 DIAGNOSIS — K551 Chronic vascular disorders of intestine: Secondary | ICD-10-CM

## 2022-06-20 DIAGNOSIS — E1159 Type 2 diabetes mellitus with other circulatory complications: Secondary | ICD-10-CM | POA: Diagnosis not present

## 2022-06-20 DIAGNOSIS — I25118 Atherosclerotic heart disease of native coronary artery with other forms of angina pectoris: Secondary | ICD-10-CM | POA: Diagnosis not present

## 2022-06-20 DIAGNOSIS — M25552 Pain in left hip: Secondary | ICD-10-CM | POA: Diagnosis not present

## 2022-06-20 DIAGNOSIS — E1142 Type 2 diabetes mellitus with diabetic polyneuropathy: Secondary | ICD-10-CM | POA: Diagnosis not present

## 2022-06-20 DIAGNOSIS — F325 Major depressive disorder, single episode, in full remission: Secondary | ICD-10-CM | POA: Diagnosis not present

## 2022-06-21 ENCOUNTER — Ambulatory Visit (INDEPENDENT_AMBULATORY_CARE_PROVIDER_SITE_OTHER): Payer: Medicare HMO | Admitting: Vascular Surgery

## 2022-06-21 ENCOUNTER — Encounter (INDEPENDENT_AMBULATORY_CARE_PROVIDER_SITE_OTHER): Payer: Medicare HMO

## 2022-06-24 ENCOUNTER — Encounter (INDEPENDENT_AMBULATORY_CARE_PROVIDER_SITE_OTHER): Payer: Self-pay | Admitting: *Deleted

## 2022-06-24 DIAGNOSIS — F0390 Unspecified dementia without behavioral disturbance: Secondary | ICD-10-CM | POA: Diagnosis not present

## 2022-06-24 DIAGNOSIS — F332 Major depressive disorder, recurrent severe without psychotic features: Secondary | ICD-10-CM | POA: Diagnosis not present

## 2022-06-28 DIAGNOSIS — S72142D Displaced intertrochanteric fracture of left femur, subsequent encounter for closed fracture with routine healing: Secondary | ICD-10-CM | POA: Diagnosis not present

## 2022-06-28 DIAGNOSIS — Z96651 Presence of right artificial knee joint: Secondary | ICD-10-CM | POA: Diagnosis not present

## 2022-06-29 DIAGNOSIS — B351 Tinea unguium: Secondary | ICD-10-CM | POA: Diagnosis not present

## 2022-06-29 DIAGNOSIS — M79675 Pain in left toe(s): Secondary | ICD-10-CM | POA: Diagnosis not present

## 2022-06-29 DIAGNOSIS — M79674 Pain in right toe(s): Secondary | ICD-10-CM | POA: Diagnosis not present

## 2022-06-30 DIAGNOSIS — M25552 Pain in left hip: Secondary | ICD-10-CM | POA: Diagnosis not present

## 2022-07-01 DIAGNOSIS — I351 Nonrheumatic aortic (valve) insufficiency: Secondary | ICD-10-CM | POA: Diagnosis not present

## 2022-07-04 DIAGNOSIS — M25552 Pain in left hip: Secondary | ICD-10-CM | POA: Diagnosis not present

## 2022-07-06 ENCOUNTER — Emergency Department: Payer: Medicare HMO

## 2022-07-06 ENCOUNTER — Other Ambulatory Visit: Payer: Self-pay

## 2022-07-06 ENCOUNTER — Emergency Department
Admission: EM | Admit: 2022-07-06 | Discharge: 2022-07-06 | Disposition: A | Discharge status: Home / Self Care | Payer: Medicare HMO | Attending: Emergency Medicine | Admitting: Emergency Medicine

## 2022-07-06 DIAGNOSIS — R410 Disorientation, unspecified: Secondary | ICD-10-CM | POA: Diagnosis not present

## 2022-07-06 DIAGNOSIS — R791 Abnormal coagulation profile: Secondary | ICD-10-CM | POA: Diagnosis not present

## 2022-07-06 DIAGNOSIS — R112 Nausea with vomiting, unspecified: Secondary | ICD-10-CM | POA: Diagnosis not present

## 2022-07-06 DIAGNOSIS — R29818 Other symptoms and signs involving the nervous system: Secondary | ICD-10-CM | POA: Diagnosis not present

## 2022-07-06 DIAGNOSIS — M25552 Pain in left hip: Secondary | ICD-10-CM | POA: Diagnosis not present

## 2022-07-06 DIAGNOSIS — R2990 Unspecified symptoms and signs involving the nervous system: Secondary | ICD-10-CM

## 2022-07-06 DIAGNOSIS — R42 Dizziness and giddiness: Secondary | ICD-10-CM | POA: Insufficient documentation

## 2022-07-06 DIAGNOSIS — I672 Cerebral atherosclerosis: Secondary | ICD-10-CM | POA: Diagnosis not present

## 2022-07-06 DIAGNOSIS — Z79899 Other long term (current) drug therapy: Secondary | ICD-10-CM | POA: Diagnosis not present

## 2022-07-06 LAB — DIFFERENTIAL
Abs Immature Granulocytes: 0.03 10*3/uL (ref 0.00–0.07)
Basophils Absolute: 0 10*3/uL (ref 0.0–0.1)
Basophils Relative: 1 %
Eosinophils Absolute: 0.2 10*3/uL (ref 0.0–0.5)
Eosinophils Relative: 4 %
Immature Granulocytes: 1 %
Lymphocytes Relative: 21 %
Lymphs Abs: 1.2 10*3/uL (ref 0.7–4.0)
Monocytes Absolute: 0.6 10*3/uL (ref 0.1–1.0)
Monocytes Relative: 10 %
Neutro Abs: 3.8 10*3/uL (ref 1.7–7.7)
Neutrophils Relative %: 63 %

## 2022-07-06 LAB — COMPREHENSIVE METABOLIC PANEL
ALT: 9 U/L (ref 0–44)
AST: 19 U/L (ref 15–41)
Albumin: 3.7 g/dL (ref 3.5–5.0)
Alkaline Phosphatase: 85 U/L (ref 38–126)
Anion gap: 5 (ref 5–15)
BUN: 34 mg/dL — ABNORMAL HIGH (ref 8–23)
CO2: 25 mmol/L (ref 22–32)
Calcium: 8.7 mg/dL — ABNORMAL LOW (ref 8.9–10.3)
Chloride: 109 mmol/L (ref 98–111)
Creatinine, Ser: 1.43 mg/dL — ABNORMAL HIGH (ref 0.61–1.24)
GFR, Estimated: 51 mL/min — ABNORMAL LOW (ref >60)
Glucose, Bld: 142 mg/dL — ABNORMAL HIGH (ref 70–99)
Potassium: 5.5 mmol/L — ABNORMAL HIGH (ref 3.5–5.1)
Sodium: 139 mmol/L (ref 135–145)
Total Bilirubin: 0.8 mg/dL (ref 0.3–1.2)
Total Protein: 6.3 g/dL — ABNORMAL LOW (ref 6.5–8.1)

## 2022-07-06 LAB — CBC
HCT: 36.5 % — ABNORMAL LOW (ref 39.0–52.0)
Hemoglobin: 11.6 g/dL — ABNORMAL LOW (ref 13.0–17.0)
MCH: 31.1 pg (ref 26.0–34.0)
MCHC: 31.8 g/dL (ref 30.0–36.0)
MCV: 97.9 fL (ref 80.0–100.0)
Platelets: 169 10*3/uL (ref 150–400)
RBC: 3.73 MIL/uL — ABNORMAL LOW (ref 4.22–5.81)
RDW: 13.8 % (ref 11.5–15.5)
WBC: 5.8 10*3/uL (ref 4.0–10.5)
nRBC: 0 % (ref 0.0–0.2)

## 2022-07-06 LAB — ETHANOL: Alcohol, Ethyl (B): <10 mg/dL (ref <10)

## 2022-07-06 LAB — PROTIME-INR
INR: 1 (ref 0.8–1.2)
Prothrombin Time: 13.4 seconds (ref 11.4–15.2)

## 2022-07-06 LAB — CBG MONITORING, ED: Glucose-Capillary: 125 mg/dL — ABNORMAL HIGH (ref 70–99)

## 2022-07-06 LAB — APTT: aPTT: 28 seconds (ref 24–36)

## 2022-07-06 MED ORDER — SODIUM CHLORIDE 0.9 % IV SOLN
100.0000 mL/h | INTRAVENOUS | Status: DC
  Administered 2022-07-06: 100 mL/h via INTRAVENOUS

## 2022-07-06 MED ORDER — ASPIRIN 325 MG PO TBEC: 325.0000 mg | DELAYED_RELEASE_TABLET | Freq: Once | ORAL | Status: DC

## 2022-07-06 MED ORDER — SODIUM CHLORIDE 0.9 % IV BOLUS
500.0000 mL | Freq: Once | INTRAVENOUS | Status: AC
  Administered 2022-07-06: 500 mL via INTRAVENOUS

## 2022-07-06 NOTE — ED Provider Notes (Signed)
[facility] Provider Note    Event Date/Time   First MD Initiated Contact with Patient 07/06/22 [phone]     (approximate)   History   Dizziness (slu) and Aphasia   HPI  Patrick Turner is a 76 y.o. male who presents to the emergency department following a dizziness episode.  Patient was working with physical therapy for a hip fracture and had an episode of dizziness and confusion.  Patient was brought down to the emergency department.  Room spinning dizziness and had episode of nausea and vomiting.  Was uncertain of where he was and had a sudden onset of confusion when working with physical therapy.  He was unable to name objects and was having slurring of speech.  Denies any recent falls or trauma for then the fall that he had 2 weeks ago.  Not on anticoagulation.      Physical Exam   Triage Vital Signs: ED Triage Vitals  Enc Vitals Group     BP      Pulse      Resp      Temp      Temp src      SpO2      Weight      Height      Head Circumference      Peak Flow      Pain Score      Pain Loc      Pain Edu?      Excl. in [place]?     Most recent vital signs: Vitals:   07/06/22 1000 07/06/22 1100  BP: (!) 172/62 (!) 157/61  Pulse: (!) 53 (!) 55  Resp: 15 16  Temp:  98 F (36.7 C)  SpO2: 100% 100%    Physical Exam Constitutional:      Appearance: He is well-developed.  HENT:     Head: Atraumatic.  Eyes:     Conjunctiva/sclera: Conjunctivae normal.  Cardiovascular:     Rate and Rhythm: Regular rhythm.  Pulmonary:     Effort: No respiratory distress.  Musculoskeletal:     Cervical back: Normal range of motion.  Skin:    General: Skin is warm.  Neurological:     Mental Status: He is alert. He is disoriented.     Comments: Slurred speech, unable to name objects.  Disoriented to place.  Following simple commands.  Cranial nerves intact.  5/5 strength bilateral upper and lower extremities.          IMPRESSION / MDM / ASSESSMENT  AND PLAN / ED COURSE  I reviewed the triage vital signs and the nursing notes.  Differential diagnosis including CVA, posterior circulation dissection, peripheral vertigo, orthostatic hypotension, dysrhythmia  Made an activated code stroke given that the patient is still within the window of TNK.  Taken immediately to scanner and consulted neurologic who is at bedside.    EKG  My interpretation of the EKG -normal sinus rhythm.  Normal intervals.  No chamber enlargement.  No significant ST elevation or depression.  No signs of acute ischemia or dysrhythmia.  No tachycardic or bradycardic dysrhythmias while on cardiac telemetry.  RADIOLOGY I independently reviewed imaging, my interpretation of imaging: CT scan of the head shows no signs of intracranial hemorrhage or infarction.    ED Results / Procedures / Treatments   Labs (all labs ordered are listed, but only abnormal results are displayed) Labs interpreted as -    Labs Reviewed  CBC - Abnormal; Notable for  the following components:      Result Value   RBC 3.73 (*)    Hemoglobin 11.6 (*)    HCT 36.5 (*)    All other components within normal limits  COMPREHENSIVE METABOLIC PANEL - Abnormal; Notable for the following components:   Potassium 5.5 (*)    Glucose, Bld 142 (*)    BUN 34 (*)    Creatinine, Ser 1.43 (*)    Calcium 8.7 (*)    Total Protein 6.3 (*)    GFR, Estimated 51 (*)    All other components within normal limits  CBG MONITORING, ED - Abnormal; Notable for the following components:   Glucose-Capillary 125 (*)    All other components within normal limits  ETHANOL  PROTIME-INR  APTT  DIFFERENTIAL    Neurology recommended MRI.  Did discuss with the patient's wife.  Patient stated that this is happened multiple times in the past and this is not a stroke.  Patient refusing a MRI.  Able to state reasons why we want an MRI.  Patient does have capacity.  Discussed close follow-up as an outpatient and given return  precautions for any recurrent episodes.  Patient was out [place].  PROCEDURES:  Critical Care performed: Yes  Procedures  Total critical care time: 45 minutes  Critical care time was exclusive of separately billable procedures and treating other patients.  Critical care was necessary to treat or prevent imminent or life-threatening deterioration.  Critical care was time spent personally by me on the following activities: development of treatment plan with patient and/or surrogate as well as nursing, discussions with consultants, evaluation of patient's response to treatment, examination of patient, obtaining history from patient or surrogate, ordering and performing treatments and interventions, ordering and review of laboratory studies, ordering and review of radiographic studies, pulse oximetry and re-evaluation of patient's condition.   Patient's presentation is most consistent with acute presentation with potential threat to life or bodily function.   MEDICATIONS ORDERED IN ED: Medications  sodium chloride 0.9 % bolus 500 mL (0 mLs Intravenous Stopped 07/06/22 1141)    Followed by  0.9 %  sodium chloride infusion (0 mL/hr Intravenous Stopped 07/06/22 1146)  aspirin EC tablet 325 mg (325 mg Oral Patient Refused/Not Given 07/06/22 1141)    FINAL CLINICAL IMPRESSION(S) / ED DIAGNOSES   Final diagnoses:  Dizziness     Rx / DC Orders   ED Discharge Orders     None        Note:  This document was prepared using Dragon voice recognition software and may include unintentional dictation errors.   Nathaniel Man, MD 07/06/22 [phone]

## 2022-07-06 NOTE — Progress Notes (Addendum)
Telestroke RN Note  [id number]: Code stroke activated via cart. [place] 0900.   1638: Page sent to Dr. Curly Shores.  0930: Patient taken to CT.  4665: Dr. Curly Shores at bedside assessing patient. MRS 2 & NIHSS 2.   9935: Patient taken back to room.  0948: Signed off cart.

## 2022-07-06 NOTE — ED Notes (Signed)
Patient states "I feel better and I am ready to leave now". Patient encouraged to stay and have MRI. Risks of leaving explained. Called patient's spouse and spoke to her. She stated "that is fine. The MRI never shows anything anyway. I can be there to get him in 10 minutes". Provider informed.

## 2022-07-06 NOTE — Consult Note (Signed)
Neurology Consultation Reason for Consult:  Requesting Physician: Nathaniel Man  CC: Dizziness and disorientation  History is obtained from: Wife and chart review  HPI: Patrick Turner is a 76 y.o. 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  He is working with PT today for his chronic left hip issues and pulling his leg back-and-forth with some significant pain.  He subsequently became dizzy and confused for which code stroke was activated.  His symptoms were rapidly resolving during my evaluation and his examination was nonfocal.  He denies any other acute concerns.  In conversation with wife she also denies any other acute concerns, but notes she was worried that this may be related to his blood sugar as he did not eat breakfast this morning.  She notes that he sleeps a lot typically from 11 PM till noon and when she will wake him up, and that he is not currently using CPAP  Typical episode he gets disoriented and is slow to respond and can't name things. Happening since at least 2019, every few months, with the last episode several months ago.  While he is still driving, not getting lost or having accidents, he is only driving locally due to family concerns about these episodes.  LKW: 9 AM Thrombolytic given?: No, too mild to treat IA performed?: No, symptoms not consistent with LVO Premorbid modified rankin scale:      2 - Slight disability. Able to look after own affairs without assistance, but unable to carry out all previous activities.   ROS: All other review of systems was negative except as noted in the HPI within limits of some mild confusion  Past Medical History:  Diagnosis Date   Altered mental status    Anemia    Anxiety    Aortic  atherosclerosis (HCC)    Arthritis    Coronary artery disease    CRD (chronic renal disease)    Depression    Diabetes mellitus without complication (HCC)    Encephalopathy acute    GERD (gastroesophageal reflux disease)    Headache    Hypercholesteremia    Hypertension    Neuropathy    Severe obesity ([place])    Sleep apnea    Sleep terror    per patient this year per patient    Past Surgical History:  Procedure Laterality Date   APPENDECTOMY     CIRCUMCISION     COLONOSCOPY     COLONOSCOPY WITH PROPOFOL N/A 07/10/2018   Procedure: COLONOSCOPY WITH PROPOFOL;  Surgeon: Lollie Sails, MD;  Location: [facility] ENDOSCOPY;  Service: Endoscopy;  Laterality: N/A;   COLONOSCOPY WITH PROPOFOL N/A 04/23/2019   Procedure: COLONOSCOPY WITH PROPOFOL;  Surgeon: Lollie Sails, MD;  Location: [facility] ENDOSCOPY;  Service: Endoscopy;  Laterality: N/A;   CORONARY STENT INTERVENTION N/A 10/03/2019   Procedure: CORONARY STENT INTERVENTION;  Surgeon: Yolonda Kida, MD;  Location: [place] [facility] LAB;  Service: Cardiovascular;  Laterality: N/A;   ESOPHAGOGASTRODUODENOSCOPY     ESOPHAGOGASTRODUODENOSCOPY (EGD) WITH PROPOFOL N/A 04/23/2019   Procedure: ESOPHAGOGASTRODUODENOSCOPY (EGD) WITH PROPOFOL;  Surgeon: Lollie Sails, MD;  Location: [facility] ENDOSCOPY;  Service: Endoscopy;  Laterality: N/A;   INTRAMEDULLARY (IM) NAIL INTERTROCHANTERIC Left 09/17/2021   Procedure: INTRAMEDULLARY (IM) NAIL INTERTROCHANTRIC;  Surgeon: Willaim Sheng, MD;  Location: [place];  Service: Orthopedics;  Laterality: Left;   JOINT REPLACEMENT     KNEE ARTHROPLASTY Right 06/27/2016   Procedure: COMPUTER ASSISTED TOTAL KNEE ARTHROPLASTY;  Surgeon: Dereck Leep, MD;  Location: [facility] ORS;  Service: Orthopedics;  Laterality: Right;   KNEE ARTHROSCOPY Right    LEFT HEART CATH AND CORONARY ANGIOGRAPHY N/A 10/03/2019   Procedure: Left Heart Cath and possible Coronary intervention;  Surgeon: Dionisio David, MD;  Location: [place] [facility];  Service: Cardiovascular;  Laterality: N/A;   TONSILLECTOMY     VISCERAL ANGIOGRAPHY N/A 05/23/2019   Procedure: VISCERAL ANGIOGRAPHY;  Surgeon: Algernon Huxley, MD;  Location: [place] [facility];  Service: Cardiovascular;  Laterality: N/A;   VISCERAL ANGIOGRAPHY N/A 11/04/2019   Procedure: VISCERAL ANGIOGRAPHY;  Surgeon: Algernon Huxley, MD;  Location: [place] [facility];  Service: Cardiovascular;  Laterality: N/A;   VISCERAL ANGIOGRAPHY N/A 12/14/2021   Procedure: VISCERAL ANGIOGRAPHY;  Surgeon: Algernon Huxley, MD;  Location: [place] [facility];  Service: Cardiovascular;  Laterality: N/A;    Current Outpatient Medications  Medication Instructions   alendronate (FOSAMAX) 70 mg, Oral, Weekly, Take with a full glass of water on an empty stomach.   amLODipine (NORVASC) 10 mg, Oral, Daily   ascorbic acid (VITAMIN C) 500 mg, Oral, Daily   aspirin 81 mg, Oral, Daily   atorvastatin (LIPITOR) 80 mg, Oral, Daily   carvedilol (COREG) 6.25 mg, Oral, 2 times daily with meals   Cholecalciferol 2,000 Units, Oral, Daily   cyanocobalamin (VITAMIN B12) 1,000 mcg, Oral, Daily   donepezil (ARICEPT) 5 mg, Oral, Daily at bedtime   enoxaparin (LOVENOX) 30 mg, Subcutaneous, Every 12 hours   escitalopram (LEXAPRO) 10 mg, Oral, Daily   Fish Oil 1,000 mg, Oral, Daily   gabapentin (NEURONTIN) 400 mg, Oral, 2 times daily   insulin detemir (LEVEMIR) 15 Units, Subcutaneous, Daily at bedtime   iron polysaccharides (NIFEREX) 150 mg, Oral, Daily   pantoprazole (PROTONIX) 40 mg, Oral, Daily   PARoxetine (PAXIL) 40 mg, Nightly   QUEtiapine (SEROQUEL) 100 mg, Oral, Daily at bedtime   ranolazine (RANEXA) 1,000 mg, Oral, 2 times daily   Semaglutide(0.25 or 0.'5MG'$ /DOS) 0.5 mg, Every Wed   valsartan (DIOVAN) 160 mg, Daily     Family History  Problem Relation Age of Onset   Diabetes Mother    Heart disease Mother    Cancer Father    COPD Father     Social History:  reports that he has never smoked. He  has never used smokeless tobacco. He reports current alcohol use. He reports that he does not use drugs.   Exam: Current vital signs: BP (!) 166/126 (BP Location: Right Arm)   Pulse (!) 55   Resp 16   SpO2 99%  Vital signs in last 24 hours: Pulse Rate:  [55] 55 (10/25 0925) Resp:  [16] 16 (10/25 0925) BP: (166)/(126) 166/126 (10/25 0925) SpO2:  [99 %] 99 % (10/25 0925)   Physical Exam  Constitutional: Appears elderly and pale but in no acute distress, fatigued Psych: Affect calm and cooperative  eyes: No scleral injection HENT: No oropharyngeal obstruction.  MSK: no joint deformities.  Cardiovascular: Slightly bradycardic in the 50s, regular rhythm.  Respiratory: Effort normal, non-labored breathing GI: Soft.  No distension. There is no tenderness.  Skin: Warm dry and intact visible skin  Neuro: Mental Status: Patient is awake, alert, oriented to person, place, month, age, and situation. Patient is able to give limited history due to  confusion/disorientation initially and slurred speech, but this was improving during my evaluation Initially had some trouble naming lenses and frame, but this improved over the course of my evaluation.  Slowed speech which was improving during the course of my evaluation Cranial Nerves: II: Visual Fields are full. Pupils are round, and reactive to light, left pupil just slightly larger than the right (0.5 to 1 mm). III,IV, VI: EOMI without ptosis or diploplia.  Saccadic pursuits with some saccadic intrusions V: Facial sensation is symmetric to light touch VII: Facial movement is symmetric.  VIII: hearing is intact to voice XII: tongue is midline without atrophy or fasciculations.  Motor: Tone is normal. Bulk is normal.  No pronator drift bilaterally.  Moves bilateral legs equally antigravity with some grimace due to pain bilaterally Sensory: Sensation is symmetric to light touch and temperature in the arms and legs. Cerebellar: Finger-nose is  intact bilaterally Gait:  Deferred given patient fatigue  NIHSS total 2 --> 1 --> 0 Score breakdown: Initially point for aphasia and dysarthria which both resolved, aphasia resolving before dysarthria Performed at time 9:28 AM   I have reviewed labs in epic and the results pertinent to this consultation are:  Basic Metabolic Panel: No results for input(s): "NA", "K", "CL", "CO2", "GLUCOSE", "BUN", "CREATININE", "CALCIUM", "MG", "PHOS" in the last 168 hours.  CBC: Recent Labs  Lab 07/06/22 0953  WBC 5.8  NEUTROABS 3.8  HGB 11.6*  HCT 36.5*  MCV 97.9  PLT 169    Coagulation Studies: No results for input(s): "LABPROT", "INR" in the last 72 hours.    I have reviewed the images obtained: Head CT personally reviewed, agree with radiology no acute intracranial process   Impression: This appears to be one of the patient's typical spells, however given his risk factors I do think MRI brain is prudent to rule out any acute intracranial process  Recommendations: -500 mL normal saline bolus -CBC reassuring, other labs still pending, follow-up for potential toxic/metabolic etiologies per ED/primary team -MRI brain without contrast -From a neurological perspective, if the patient recovers to baseline with supportive care and MRI brain is reassuring he can have outpatient follow-up.  Recommend outpatient neurocognitive evaluation, driving evaluation and no driving while these episodes are ongoing, continued psychiatric follow-up, and repeat sleep study with reinitiation of CPAP as poor sleep may be contributing to his symptoms.  These recommendations were discussed with his wife by phone and should be included in discharge instructions   Lesleigh Noe MD-PhD [facility] [phone] Available 7 AM to 7 PM, outside these hours please contact Neurologist on call listed on AMION    Total critical care time: 30 minutes   Critical care time was exclusive of separately  billable procedures and treating other patients, emergent evaluation for potential acute neurological changes requiring potential treatment with thrombolytic or thrombectomy   Critical care was necessary to treat or prevent imminent or life-threatening deterioration.   Critical care was time spent personally by me on the following activities: development of treatment plan with patient and/or surrogate as well as nursing, discussions with consultants/primary team, evaluation of patient's response to treatment, examination of patient, obtaining history from patient or surrogate, ordering and performing treatments and interventions, ordering and review of laboratory studies, ordering and review of radiographic studies, and re-evaluation of patient's condition as needed, as documented above.

## 2022-07-06 NOTE — Code Documentation (Signed)
Stroke Response Nurse Documentation Code Documentation  Patrick Turner is a 76 y.o. male arriving to [facility] via [place] on 07/06/2022 with past medical hx of DM, HTN, HLD, GERD, sleep apnea, CRD, CAD, neuropathy. On No antithrombotic. Code stroke was activated by ED.   Patient from [facility] where he was LKW at 0900 and now complaining of slurred speech and dizziness. Patient reports driving himself to physical therapy this morning and being at baseline. He was in physical therapy when he began having acute onset dizziness and slurred speech. Patient was brought to ED for evaluation.  Stroke team at the bedside after stroke called. Patient to CT with team. NIHSS 2, see documentation for details and code stroke times. Patient with Expressive aphasia  and dysarthria  on exam. The following imaging was completed:  CT Head. Patient is not a candidate for IV Thrombolytic due to stroke not suspected. Patient is not a candidate for IR due to LVO not suspected, per MD.   Care Plan: Q2 NIHSS + VS.   Bedside handoff with ED RN Tiffany.    Charise Carwin  Stroke Response RN

## 2022-07-06 NOTE — Progress Notes (Signed)
  Chaplain On-Call responded to Code Stroke notification at 0923 hours for ED room 11.  Chaplain learned from Staff that the patient is in the CT Scan area for tests.  There is no family present.  Chaplain is available for additional support if requested.  Chaplain Pollyann Samples M.Div., [facility]

## 2022-07-06 NOTE — ED Notes (Signed)
Code  stroke  called  to  [facility] 

## 2022-07-06 NOTE — ED Notes (Signed)
MRI came to pick up patient and patient refused to go to MRI. Stated "I had a CT and that was enough".  Provider informed.

## 2022-07-06 NOTE — ED Triage Notes (Signed)
Pt has sudden onset of dizziness and slurred speech during PT around 0900. No blood thinners

## 2022-07-08 DIAGNOSIS — I340 Nonrheumatic mitral (valve) insufficiency: Secondary | ICD-10-CM | POA: Diagnosis not present

## 2022-07-08 DIAGNOSIS — I351 Nonrheumatic aortic (valve) insufficiency: Secondary | ICD-10-CM | POA: Diagnosis not present

## 2022-07-08 DIAGNOSIS — K219 Gastro-esophageal reflux disease without esophagitis: Secondary | ICD-10-CM | POA: Diagnosis not present

## 2022-07-08 DIAGNOSIS — E782 Mixed hyperlipidemia: Secondary | ICD-10-CM | POA: Diagnosis not present

## 2022-07-08 DIAGNOSIS — I10 Essential (primary) hypertension: Secondary | ICD-10-CM | POA: Diagnosis not present

## 2022-07-08 DIAGNOSIS — E669 Obesity, unspecified: Secondary | ICD-10-CM | POA: Diagnosis not present

## 2022-07-08 DIAGNOSIS — I2510 Atherosclerotic heart disease of native coronary artery without angina pectoris: Secondary | ICD-10-CM | POA: Diagnosis not present

## 2022-07-08 DIAGNOSIS — R4020 Unspecified coma: Secondary | ICD-10-CM | POA: Diagnosis not present

## 2022-07-11 ENCOUNTER — Encounter (INDEPENDENT_AMBULATORY_CARE_PROVIDER_SITE_OTHER): Payer: Self-pay

## 2022-07-11 DIAGNOSIS — Z794 Long term (current) use of insulin: Secondary | ICD-10-CM | POA: Diagnosis not present

## 2022-07-11 DIAGNOSIS — I10 Essential (primary) hypertension: Secondary | ICD-10-CM | POA: Diagnosis not present

## 2022-07-11 DIAGNOSIS — M810 Age-related osteoporosis without current pathological fracture: Secondary | ICD-10-CM | POA: Diagnosis not present

## 2022-07-11 DIAGNOSIS — E1159 Type 2 diabetes mellitus with other circulatory complications: Secondary | ICD-10-CM | POA: Diagnosis not present

## 2022-07-11 DIAGNOSIS — E1122 Type 2 diabetes mellitus with diabetic chronic kidney disease: Secondary | ICD-10-CM | POA: Diagnosis not present

## 2022-07-11 DIAGNOSIS — E1142 Type 2 diabetes mellitus with diabetic polyneuropathy: Secondary | ICD-10-CM | POA: Diagnosis not present

## 2022-07-11 DIAGNOSIS — N183 Chronic kidney disease, stage 3 unspecified: Secondary | ICD-10-CM | POA: Diagnosis not present

## 2022-07-11 DIAGNOSIS — E113393 Type 2 diabetes mellitus with moderate nonproliferative diabetic retinopathy without macular edema, bilateral: Secondary | ICD-10-CM | POA: Diagnosis not present

## 2022-07-13 DIAGNOSIS — I779 Disorder of arteries and arterioles, unspecified: Secondary | ICD-10-CM | POA: Diagnosis not present

## 2022-07-13 DIAGNOSIS — R42 Dizziness and giddiness: Secondary | ICD-10-CM | POA: Diagnosis not present

## 2022-07-13 DIAGNOSIS — R4020 Unspecified coma: Secondary | ICD-10-CM | POA: Diagnosis not present

## 2022-07-19 DIAGNOSIS — M25552 Pain in left hip: Secondary | ICD-10-CM | POA: Diagnosis not present

## 2022-07-21 DIAGNOSIS — E782 Mixed hyperlipidemia: Secondary | ICD-10-CM | POA: Diagnosis not present

## 2022-07-21 DIAGNOSIS — E669 Obesity, unspecified: Secondary | ICD-10-CM | POA: Diagnosis not present

## 2022-07-21 DIAGNOSIS — I10 Essential (primary) hypertension: Secondary | ICD-10-CM | POA: Diagnosis not present

## 2022-07-21 DIAGNOSIS — I351 Nonrheumatic aortic (valve) insufficiency: Secondary | ICD-10-CM | POA: Diagnosis not present

## 2022-07-21 DIAGNOSIS — I340 Nonrheumatic mitral (valve) insufficiency: Secondary | ICD-10-CM | POA: Diagnosis not present

## 2022-07-21 DIAGNOSIS — K219 Gastro-esophageal reflux disease without esophagitis: Secondary | ICD-10-CM | POA: Diagnosis not present

## 2022-07-21 DIAGNOSIS — I2510 Atherosclerotic heart disease of native coronary artery without angina pectoris: Secondary | ICD-10-CM | POA: Diagnosis not present

## 2022-07-21 DIAGNOSIS — I6389 Other cerebral infarction: Secondary | ICD-10-CM | POA: Diagnosis not present

## 2022-07-26 DIAGNOSIS — M25552 Pain in left hip: Secondary | ICD-10-CM | POA: Diagnosis not present

## 2022-08-02 ENCOUNTER — Other Ambulatory Visit
Admission: RE | Admit: 2022-08-02 | Discharge: 2022-08-02 | Disposition: A | Discharge status: Home / Self Care | Payer: Medicare HMO | Source: Ambulatory Visit | Attending: Internal Medicine | Admitting: Internal Medicine

## 2022-08-02 DIAGNOSIS — R42 Dizziness and giddiness: Secondary | ICD-10-CM | POA: Insufficient documentation

## 2022-08-02 DIAGNOSIS — M25552 Pain in left hip: Secondary | ICD-10-CM | POA: Diagnosis not present

## 2022-08-02 LAB — TROPONIN I (HIGH SENSITIVITY): Troponin I (High Sensitivity): 4 ng/L (ref <18)

## 2022-08-12 DIAGNOSIS — E1142 Type 2 diabetes mellitus with diabetic polyneuropathy: Secondary | ICD-10-CM | POA: Diagnosis not present

## 2022-08-16 DIAGNOSIS — S72142D Displaced intertrochanteric fracture of left femur, subsequent encounter for closed fracture with routine healing: Secondary | ICD-10-CM | POA: Diagnosis not present

## 2022-08-25 DIAGNOSIS — I6389 Other cerebral infarction: Secondary | ICD-10-CM | POA: Diagnosis not present

## 2022-08-25 DIAGNOSIS — I351 Nonrheumatic aortic (valve) insufficiency: Secondary | ICD-10-CM | POA: Diagnosis not present

## 2022-08-25 DIAGNOSIS — I10 Essential (primary) hypertension: Secondary | ICD-10-CM | POA: Diagnosis not present

## 2022-08-25 DIAGNOSIS — G4730 Sleep apnea, unspecified: Secondary | ICD-10-CM | POA: Diagnosis not present

## 2022-08-25 DIAGNOSIS — I2510 Atherosclerotic heart disease of native coronary artery without angina pectoris: Secondary | ICD-10-CM | POA: Diagnosis not present

## 2022-08-25 DIAGNOSIS — I340 Nonrheumatic mitral (valve) insufficiency: Secondary | ICD-10-CM | POA: Diagnosis not present

## 2022-08-25 DIAGNOSIS — K219 Gastro-esophageal reflux disease without esophagitis: Secondary | ICD-10-CM | POA: Diagnosis not present

## 2022-08-25 DIAGNOSIS — E782 Mixed hyperlipidemia: Secondary | ICD-10-CM | POA: Diagnosis not present

## 2022-08-25 DIAGNOSIS — E669 Obesity, unspecified: Secondary | ICD-10-CM | POA: Diagnosis not present

## 2022-09-06 DIAGNOSIS — R229 Localized swelling, mass and lump, unspecified: Secondary | ICD-10-CM | POA: Diagnosis not present

## 2022-09-06 DIAGNOSIS — Z794 Long term (current) use of insulin: Secondary | ICD-10-CM | POA: Diagnosis not present

## 2022-09-06 DIAGNOSIS — E119 Type 2 diabetes mellitus without complications: Secondary | ICD-10-CM | POA: Diagnosis not present

## 2022-09-16 DIAGNOSIS — F0390 Unspecified dementia without behavioral disturbance: Secondary | ICD-10-CM | POA: Diagnosis not present

## 2022-09-16 DIAGNOSIS — F419 Anxiety disorder, unspecified: Secondary | ICD-10-CM | POA: Diagnosis not present

## 2022-09-16 DIAGNOSIS — F0392 Unspecified dementia, unspecified severity, with psychotic disturbance: Secondary | ICD-10-CM | POA: Diagnosis not present

## 2022-09-16 DIAGNOSIS — J40 Bronchitis, not specified as acute or chronic: Secondary | ICD-10-CM | POA: Diagnosis not present

## 2022-09-16 DIAGNOSIS — E1142 Type 2 diabetes mellitus with diabetic polyneuropathy: Secondary | ICD-10-CM | POA: Diagnosis not present

## 2022-09-16 DIAGNOSIS — F332 Major depressive disorder, recurrent severe without psychotic features: Secondary | ICD-10-CM | POA: Diagnosis not present

## 2022-09-23 DIAGNOSIS — E113311 Type 2 diabetes mellitus with moderate nonproliferative diabetic retinopathy with macular edema, right eye: Secondary | ICD-10-CM | POA: Diagnosis not present

## 2022-09-23 DIAGNOSIS — E113312 Type 2 diabetes mellitus with moderate nonproliferative diabetic retinopathy with macular edema, left eye: Secondary | ICD-10-CM | POA: Diagnosis not present

## 2022-10-18 DIAGNOSIS — Z794 Long term (current) use of insulin: Secondary | ICD-10-CM | POA: Diagnosis not present

## 2022-10-18 DIAGNOSIS — B351 Tinea unguium: Secondary | ICD-10-CM | POA: Diagnosis not present

## 2022-10-18 DIAGNOSIS — N183 Chronic kidney disease, stage 3 unspecified: Secondary | ICD-10-CM | POA: Diagnosis not present

## 2022-10-18 DIAGNOSIS — M79674 Pain in right toe(s): Secondary | ICD-10-CM | POA: Diagnosis not present

## 2022-10-18 DIAGNOSIS — I700 Atherosclerosis of aorta: Secondary | ICD-10-CM | POA: Diagnosis not present

## 2022-10-18 DIAGNOSIS — M810 Age-related osteoporosis without current pathological fracture: Secondary | ICD-10-CM | POA: Diagnosis not present

## 2022-10-18 DIAGNOSIS — E1142 Type 2 diabetes mellitus with diabetic polyneuropathy: Secondary | ICD-10-CM | POA: Diagnosis not present

## 2022-10-18 DIAGNOSIS — M2042 Other hammer toe(s) (acquired), left foot: Secondary | ICD-10-CM | POA: Diagnosis not present

## 2022-10-18 DIAGNOSIS — E1122 Type 2 diabetes mellitus with diabetic chronic kidney disease: Secondary | ICD-10-CM | POA: Diagnosis not present

## 2022-10-18 DIAGNOSIS — M79675 Pain in left toe(s): Secondary | ICD-10-CM | POA: Diagnosis not present

## 2022-10-18 DIAGNOSIS — I25118 Atherosclerotic heart disease of native coronary artery with other forms of angina pectoris: Secondary | ICD-10-CM | POA: Diagnosis not present

## 2022-10-20 DIAGNOSIS — N1831 Chronic kidney disease, stage 3a: Secondary | ICD-10-CM | POA: Diagnosis not present

## 2022-10-20 DIAGNOSIS — E1122 Type 2 diabetes mellitus with diabetic chronic kidney disease: Secondary | ICD-10-CM | POA: Diagnosis not present

## 2022-10-20 DIAGNOSIS — I10 Essential (primary) hypertension: Secondary | ICD-10-CM | POA: Diagnosis not present

## 2022-10-20 DIAGNOSIS — N1832 Chronic kidney disease, stage 3b: Secondary | ICD-10-CM | POA: Diagnosis not present

## 2022-10-20 DIAGNOSIS — D631 Anemia in chronic kidney disease: Secondary | ICD-10-CM | POA: Diagnosis not present

## 2022-10-20 DIAGNOSIS — I129 Hypertensive chronic kidney disease with stage 1 through stage 4 chronic kidney disease, or unspecified chronic kidney disease: Secondary | ICD-10-CM | POA: Diagnosis not present

## 2022-10-20 DIAGNOSIS — N2581 Secondary hyperparathyroidism of renal origin: Secondary | ICD-10-CM | POA: Diagnosis not present

## 2022-10-20 DIAGNOSIS — R809 Proteinuria, unspecified: Secondary | ICD-10-CM | POA: Diagnosis not present

## 2022-10-20 DIAGNOSIS — E875 Hyperkalemia: Secondary | ICD-10-CM | POA: Diagnosis not present

## 2022-10-24 DIAGNOSIS — N1831 Chronic kidney disease, stage 3a: Secondary | ICD-10-CM | POA: Diagnosis not present

## 2022-10-24 DIAGNOSIS — I10 Essential (primary) hypertension: Secondary | ICD-10-CM | POA: Diagnosis not present

## 2022-10-24 DIAGNOSIS — E1122 Type 2 diabetes mellitus with diabetic chronic kidney disease: Secondary | ICD-10-CM | POA: Diagnosis not present

## 2022-10-24 DIAGNOSIS — N2581 Secondary hyperparathyroidism of renal origin: Secondary | ICD-10-CM | POA: Diagnosis not present

## 2022-10-24 DIAGNOSIS — R809 Proteinuria, unspecified: Secondary | ICD-10-CM | POA: Diagnosis not present

## 2022-10-24 DIAGNOSIS — D631 Anemia in chronic kidney disease: Secondary | ICD-10-CM | POA: Diagnosis not present

## 2022-10-24 DIAGNOSIS — G4733 Obstructive sleep apnea (adult) (pediatric): Secondary | ICD-10-CM | POA: Diagnosis not present

## 2022-10-25 DIAGNOSIS — I25118 Atherosclerotic heart disease of native coronary artery with other forms of angina pectoris: Secondary | ICD-10-CM | POA: Diagnosis not present

## 2022-10-25 DIAGNOSIS — Z0000 Encounter for general adult medical examination without abnormal findings: Secondary | ICD-10-CM | POA: Diagnosis not present

## 2022-10-25 DIAGNOSIS — I779 Disorder of arteries and arterioles, unspecified: Secondary | ICD-10-CM | POA: Diagnosis not present

## 2022-10-25 DIAGNOSIS — F03A3 Unspecified dementia, mild, with mood disturbance: Secondary | ICD-10-CM | POA: Diagnosis not present

## 2022-10-25 DIAGNOSIS — E1122 Type 2 diabetes mellitus with diabetic chronic kidney disease: Secondary | ICD-10-CM | POA: Diagnosis not present

## 2022-10-25 DIAGNOSIS — R296 Repeated falls: Secondary | ICD-10-CM | POA: Diagnosis not present

## 2022-10-25 DIAGNOSIS — F325 Major depressive disorder, single episode, in full remission: Secondary | ICD-10-CM | POA: Diagnosis not present

## 2022-10-25 DIAGNOSIS — I110 Hypertensive heart disease with heart failure: Secondary | ICD-10-CM | POA: Diagnosis not present

## 2022-10-25 DIAGNOSIS — E1142 Type 2 diabetes mellitus with diabetic polyneuropathy: Secondary | ICD-10-CM | POA: Diagnosis not present

## 2022-10-25 DIAGNOSIS — I509 Heart failure, unspecified: Secondary | ICD-10-CM | POA: Diagnosis not present

## 2022-10-25 DIAGNOSIS — I129 Hypertensive chronic kidney disease with stage 1 through stage 4 chronic kidney disease, or unspecified chronic kidney disease: Secondary | ICD-10-CM | POA: Diagnosis not present

## 2022-10-26 ENCOUNTER — Other Ambulatory Visit: Payer: Self-pay | Admitting: Cardiovascular Disease

## 2022-10-26 DIAGNOSIS — I2510 Atherosclerotic heart disease of native coronary artery without angina pectoris: Secondary | ICD-10-CM

## 2022-10-27 DIAGNOSIS — G4733 Obstructive sleep apnea (adult) (pediatric): Secondary | ICD-10-CM | POA: Diagnosis not present

## 2022-10-28 DIAGNOSIS — E113313 Type 2 diabetes mellitus with moderate nonproliferative diabetic retinopathy with macular edema, bilateral: Secondary | ICD-10-CM | POA: Diagnosis not present

## 2022-10-28 DIAGNOSIS — Z0100 Encounter for examination of eyes and vision without abnormal findings: Secondary | ICD-10-CM | POA: Diagnosis not present

## 2022-10-28 DIAGNOSIS — Z961 Presence of intraocular lens: Secondary | ICD-10-CM | POA: Diagnosis not present

## 2022-11-03 ENCOUNTER — Ambulatory Visit: Payer: Medicare HMO | Admitting: Cardiovascular Disease

## 2022-11-04 ENCOUNTER — Encounter: Payer: Self-pay | Admitting: Cardiovascular Disease

## 2022-11-04 ENCOUNTER — Ambulatory Visit: Payer: Medicare HMO | Admitting: Cardiovascular Disease

## 2022-11-04 VITALS — BP 124/58 | HR 60 | Ht 64.0 in | Wt 187.0 lb

## 2022-11-04 DIAGNOSIS — I10 Essential (primary) hypertension: Secondary | ICD-10-CM

## 2022-11-04 DIAGNOSIS — G4733 Obstructive sleep apnea (adult) (pediatric): Secondary | ICD-10-CM

## 2022-11-04 DIAGNOSIS — E785 Hyperlipidemia, unspecified: Secondary | ICD-10-CM

## 2022-11-04 DIAGNOSIS — E782 Mixed hyperlipidemia: Secondary | ICD-10-CM

## 2022-11-04 DIAGNOSIS — E1169 Type 2 diabetes mellitus with other specified complication: Secondary | ICD-10-CM | POA: Diagnosis not present

## 2022-11-04 DIAGNOSIS — I2510 Atherosclerotic heart disease of native coronary artery without angina pectoris: Secondary | ICD-10-CM | POA: Diagnosis not present

## 2022-11-04 NOTE — Progress Notes (Signed)
Cardiology Office Note   Date:  11/04/2022   ID:  [name], DOB Jan 02, 1946, MRN [id number]  PCP:  Kirk Ruths, MD  Cardiologist:  Neoma Laming, MD      History of Present Illness: Patrick Turner is a 77 y.o. male who presents for  Chief Complaint  Patient presents with   Follow-up    2 Months Follow Up    Patient in office for routine cardiac exam. Denies chest pain, shortness of breath, edema, palpitations.      Past Medical History:  Diagnosis Date   Altered mental status    Anemia    Anxiety    Aortic atherosclerosis (HCC)    Arthritis    Coronary artery disease    CRD (chronic renal disease)    Depression    Diabetes mellitus without complication (HCC)    Encephalopathy acute    GERD (gastroesophageal reflux disease)    Headache    Hypercholesteremia    Hypertension    Neuropathy    Severe obesity ([place])    Sleep apnea    Sleep terror    per patient this year per patient      Past Surgical History:  Procedure Laterality Date   APPENDECTOMY     CIRCUMCISION     COLONOSCOPY     COLONOSCOPY WITH PROPOFOL N/A 07/10/2018   Procedure: COLONOSCOPY WITH PROPOFOL;  Surgeon: Lollie Sails, MD;  Location: [facility] ENDOSCOPY;  Service: Endoscopy;  Laterality: N/A;   COLONOSCOPY WITH PROPOFOL N/A 04/23/2019   Procedure: COLONOSCOPY WITH PROPOFOL;  Surgeon: Lollie Sails, MD;  Location: [facility] ENDOSCOPY;  Service: Endoscopy;  Laterality: N/A;   CORONARY STENT INTERVENTION N/A 10/03/2019   Procedure: CORONARY STENT INTERVENTION;  Surgeon: Yolonda Kida, MD;  Location: [place] CV LAB;  Service: Cardiovascular;  Laterality: N/A;   ESOPHAGOGASTRODUODENOSCOPY     ESOPHAGOGASTRODUODENOSCOPY (EGD) WITH PROPOFOL N/A 04/23/2019   Procedure: ESOPHAGOGASTRODUODENOSCOPY (EGD) WITH PROPOFOL;  Surgeon: Lollie Sails, MD;  Location: [facility] ENDOSCOPY;  Service: Endoscopy;  Laterality: N/A;   INTRAMEDULLARY (IM) NAIL INTERTROCHANTERIC Left  09/17/2021   Procedure: INTRAMEDULLARY (IM) NAIL INTERTROCHANTRIC;  Surgeon: Willaim Sheng, MD;  Location: [place];  Service: Orthopedics;  Laterality: Left;   JOINT REPLACEMENT     KNEE ARTHROPLASTY Right 06/27/2016   Procedure: COMPUTER ASSISTED TOTAL KNEE ARTHROPLASTY;  Surgeon: Dereck Leep, MD;  Location: [facility] ORS;  Service: Orthopedics;  Laterality: Right;   KNEE ARTHROSCOPY Right    LEFT HEART CATH AND CORONARY ANGIOGRAPHY N/A 10/03/2019   Procedure: Left Heart Cath and possible Coronary intervention;  Surgeon: Dionisio David, MD;  Location: [place] CV LAB;  Service: Cardiovascular;  Laterality: N/A;   TONSILLECTOMY     VISCERAL ANGIOGRAPHY N/A 05/23/2019   Procedure: VISCERAL ANGIOGRAPHY;  Surgeon: Algernon Huxley, MD;  Location: [place] CV LAB;  Service: Cardiovascular;  Laterality: N/A;   VISCERAL ANGIOGRAPHY N/A 11/04/2019   Procedure: VISCERAL ANGIOGRAPHY;  Surgeon: Algernon Huxley, MD;  Location: [place] CV LAB;  Service: Cardiovascular;  Laterality: N/A;   VISCERAL ANGIOGRAPHY N/A 12/14/2021   Procedure: VISCERAL ANGIOGRAPHY;  Surgeon: Algernon Huxley, MD;  Location: [place] CV LAB;  Service: Cardiovascular;  Laterality: N/A;     Current Outpatient Medications  Medication Sig Dispense Refill   alendronate (FOSAMAX) 70 MG tablet Take 70 mg by mouth once a week. Take with a full glass of water on an empty stomach.     amLODipine (  NORVASC) 10 MG tablet Take 1 tablet (10 mg total) by mouth daily. 30 tablet 3   ascorbic acid (VITAMIN C) 500 MG tablet Take 500 mg by mouth daily.     aspirin 81 MG tablet Take 1 tablet (81 mg total) by mouth daily.     atorvastatin (LIPITOR) 80 MG tablet Take 80 mg by mouth daily.     carvedilol (COREG) 6.25 MG tablet Take 6.25 mg by mouth 2 (two) times daily with a meal.     clopidogrel (PLAVIX) 75 MG tablet Take 75 mg by mouth daily.     donepezil (ARICEPT) 5 MG tablet Take 1 tablet (5 mg total) by mouth at bedtime. 30 tablet 0    empagliflozin (JARDIANCE) 10 MG TABS tablet Take by mouth.     escitalopram (LEXAPRO) 10 MG tablet Take 10 mg by mouth daily.     gabapentin (NEURONTIN) 400 MG capsule Take 400 mg by mouth 2 (two) times daily.     insulin detemir (LEVEMIR) 100 UNIT/ML injection Inject 15 Units into the skin at bedtime.     iron polysaccharides (NIFEREX) 150 MG capsule Take 150 mg by mouth daily.     metFORMIN (GLUCOPHAGE-XR) 500 MG 24 hr tablet Take 1,000 mg by mouth 2 (two) times daily.     Omega-3 Fatty Acids (FISH OIL) 1000 MG CAPS Take 1,000 mg by mouth daily.     pantoprazole (PROTONIX) 40 MG tablet Take 40 mg by mouth daily.     QUEtiapine (SEROQUEL) 100 MG tablet Take 100 mg by mouth at bedtime.     ranolazine (RANEXA) 1000 MG SR tablet TAKE 1 TABLET TWICE DAILY 180 tablet 0   Semaglutide,0.25 or 0.'5MG'$ /DOS, 2 MG/1.5ML SOPN Inject 0.5 mg into the skin every Wednesday.     valsartan (DIOVAN) 40 MG tablet Take 40 mg by mouth daily.     vitamin B-12 (CYANOCOBALAMIN) 1000 MCG tablet Take 1,000 mcg by mouth daily.     No current facility-administered medications for this visit.    Allergies:   Penicillins and Tavist [clemastine]    Social History:   reports that he has never smoked. He has never used smokeless tobacco. He reports current alcohol use. He reports that he does not use drugs.   Family History:  family history includes COPD in his father; Cancer in his father; Diabetes in his mother; Heart disease in his mother.    ROS:     Review of Systems  Constitutional: Negative.   HENT: Negative.    Eyes: Negative.   Respiratory: Negative.    Gastrointestinal: Negative.   Genitourinary: Negative.   Musculoskeletal: Negative.   Skin: Negative.   Neurological: Negative.   Endo/Heme/Allergies: Negative.   Psychiatric/Behavioral: Negative.    All other systems reviewed and are negative.     All other systems are reviewed and negative.    PHYSICAL EXAM: VS:  BP (!) 124/58   Pulse 60   Ht  '5\' 4"'$  (1.626 m)   Wt 187 lb (84.8 kg)   SpO2 96%   BMI 32.10 kg/m  , BMI Body mass index is 32.1 kg/m. Last weight:  Wt Readings from Last 3 Encounters:  11/04/22 187 lb (84.8 kg)  07/06/22 180 lb (81.6 kg)  12/14/21 180 lb (81.6 kg)     Physical Exam Vitals reviewed.  Constitutional:      Appearance: Normal appearance. He is normal weight.  HENT:     Head: Normocephalic.     Nose: Nose normal.  Mouth/Throat:     Mouth: Mucous membranes are moist.  Eyes:     Pupils: Pupils are equal, round, and reactive to light.  Cardiovascular:     Rate and Rhythm: Normal rate and regular rhythm.     Pulses: Normal pulses.     Heart sounds: Normal heart sounds.  Pulmonary:     Effort: Pulmonary effort is normal.  Abdominal:     General: Abdomen is flat. Bowel sounds are normal.  Musculoskeletal:        General: Normal range of motion.     Cervical back: Normal range of motion.  Skin:    General: Skin is warm.  Neurological:     General: No focal deficit present.     Mental Status: He is alert.  Psychiatric:        Mood and Affect: Mood normal.      EKG: none today  Recent Labs: 11/06/2021: Magnesium 2.3 07/06/2022: ALT 9; BUN 34; Creatinine, Ser 1.43; Hemoglobin 11.6; Platelets 169; Potassium 5.5; Sodium 139    Lipid Panel    Component Value Date/Time   CHOL 120 06/25/2017 0347   TRIG 136 06/25/2017 0347   HDL 50 06/25/2017 0347   CHOLHDL 2.4 06/25/2017 0347   VLDL 27 06/25/2017 0347   LDLCALC 43 06/25/2017 0347      Other studies Reviewed:  REASON FOR VISIT  Visit for: Echocardiogram/R07.9  Sex:   Male         wt= 178   lbs.  BP=120/58  Height= 63   inches.        TESTS  Imaging: Echocardiogram:  An echocardiogram in (2-d) mode was performed and in Doppler mode with color flow velocity mapping was performed. The aortic valve cusps are abnormal 1.7 cm, flow velocity 1.86   m/s, and systolic calculated mean flow gradient 8   mmHg. Mitral valve  diastolic peak flow velocity E .80    m/s and E/A ratio 0.7. Aortic root diameter 2.5  cm. The LVOT internal diameter 3.9   cm and flow velocity was abnormal 1.32   m/s. LV systolic dimension [id number]   cm, diastolic [id number]  cm, posterior wall thickness 1.09   cm, fractional shortening 51.2  %, and EF 83.2  %. IVS thickness 1.33   cm. LA dimension 3.5 cm. Mitral Valve has Trace Regurgitation. Pulmonic Valve has Trace Regurgitation. Aortic Valve has Mild to Moderate Regurgitation. Tricuspid Valve has Trace Regurgitation.     ASSESSMENT  Technically adequate study.  Normal chamber sizes.  Normal left ventricular systolic function.  Normal right ventricular systolic function.  Normal right ventricular diastolic function.  Normal left ventricular wall motion.  Normal right ventricular wall motion.  Trace pulmonary regurgitation.  Trace tricuspid regurgitation.  Normal pulmonary artery pressure.  Trace mitral regurgitation.  Mild to Moderate aortic regurgitation.  No pericardial effusion.  Mild aortic stenosis.     THERAPY   Referring physician: Dionisio David  Sonographer: Neoma Laming.    Neoma Laming MD  Electronically signed by: Neoma Laming     Date: 03/14/2022 14:01  Patient: [id number] - Loistine Chance DOB:  1945-12-02 SSN:    Date:  03/11/2022 14:46 Provider: Neoma Laming MD Encounter: NUCLEAR STRESS TEST   Page 1 TESTS    [facility] [address] [place], [place] [address] [phone] STUDY:  Gated Stress / Rest Myocardial Perfusion Imaging Tomographic (SPECT) Including attenuation correction Wall Motion, Left Ventricular Ejection Fraction By Gated Technique.Persantine Stress Test. SEX:  Male  WEIGHT: 171 lbs  HEIGHT: 64 in      ARMS UP: YES/NO                                                                                                                                                                                REFERRING PHYSICIAN:  Dr.Iann Rodier Humphrey Rolls                                                                                                                                                                                                                       INDICATION FOR STUDY: CP  TECHNIQUE:  Approximately 20 minutes following the intravenous administration of 10.3 mCi of Tc-81mSestamibi after stress testing in a reclined supine position with arms above their head if able to do so, gated SPECT imaging of the heart was performed. After about a 2hr break, the patient was injected intravenously with 32.5 mCi of Tc-927mestamibi.  Approximately 45 minutes later in the same position as stress imaging SPECT rest imaging of the heart was performed.  STRESS BY:  ShNeoma LamingMD PROTOCOL:  Persantine   DOSE ADMIN: 8.8 CC    ROUTE OF ADMINISTRATION: IV                                                                            MAX PRED HR: 144                     85%: 122               75%: 108                                                                                                                   RESTING BP: 130/74   RESTING HR:  63  PEAK BP: 124/70   PEAK HR:  70                                                                   EXERCISE DURATION:    4 min injection                                            REASON FOR TEST TERMINATION:    Protocol end  SYMPTOMS:   None                                                                                                                                                                                                          EKG RESULTS: Baseline NSR. 64/min.  No significant ST changes with persantine.                                                              IMAGE QUALITY:  Good  PERFUSION/WALL MOTION FINDINGS:  EF = 70%. No perfusion defects, normal wall motion.                                                                          IMPRESSION:  Normal stress test with normal LVEF.                                                                                                                                                                                                                                                                                        Neoma Laming, MD Stress Interpreting Physician / Nuclear Interpreting Physician        Neoma Laming MD  Electronically signed by: Neoma Laming     Date: 03/14/2022 14:01   ASSESSMENT AND PLAN:    ICD-10-CM   1. 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 HTN (hypertension), benign  I10     3. OSA (obstructive sleep apnea)  G47.33     4. Mixed hyperlipidemia  E78.2     5. Severe obesity (BMI 35.0-39.9) with comorbidity ([place])  E66.01        Problem List Items Addressed This Visit       Cardiovascular and Mediastinum   HTN (hypertension), benign   ASCVD (arteriosclerotic cardiovascular disease) - Primary    Stress test 02/2022 normal. Denies chest pain, shortness of breath.         Respiratory   OSA (obstructive sleep apnea)    Not currently using CPAP. Discussed importance of using. Patient states he is getting a new mask, then will start using.         Other   HLD (hyperlipidemia)   Severe obesity (BMI 35.0-39.9) with comorbidity (HCC)    Relevant Medications   empagliflozin (JARDIANCE) 10 MG TABS tablet       Disposition:   No follow-ups  on file.    Total time spent: 30 minutes  Signed,  Neoma Laming, MD  11/04/2022 11:44 AM    [place]

## 2022-11-04 NOTE — Assessment & Plan Note (Signed)
Stress test 02/2022 normal. Denies chest pain, shortness of breath.

## 2022-11-04 NOTE — Assessment & Plan Note (Signed)
LDL 25 10/2022, continue atorvastatin.

## 2022-11-04 NOTE — Assessment & Plan Note (Signed)
Not currently using CPAP. Discussed importance of using. Patient states he is getting a new mask, then will start using.

## 2022-11-10 DIAGNOSIS — E1142 Type 2 diabetes mellitus with diabetic polyneuropathy: Secondary | ICD-10-CM | POA: Diagnosis not present

## 2022-11-14 DIAGNOSIS — E1159 Type 2 diabetes mellitus with other circulatory complications: Secondary | ICD-10-CM | POA: Diagnosis not present

## 2022-11-14 DIAGNOSIS — E113393 Type 2 diabetes mellitus with moderate nonproliferative diabetic retinopathy without macular edema, bilateral: Secondary | ICD-10-CM | POA: Diagnosis not present

## 2022-11-14 DIAGNOSIS — Z794 Long term (current) use of insulin: Secondary | ICD-10-CM | POA: Diagnosis not present

## 2022-11-14 DIAGNOSIS — E11649 Type 2 diabetes mellitus with hypoglycemia without coma: Secondary | ICD-10-CM | POA: Diagnosis not present

## 2022-11-14 DIAGNOSIS — E1142 Type 2 diabetes mellitus with diabetic polyneuropathy: Secondary | ICD-10-CM | POA: Diagnosis not present

## 2022-11-14 DIAGNOSIS — M810 Age-related osteoporosis without current pathological fracture: Secondary | ICD-10-CM | POA: Diagnosis not present

## 2022-11-14 DIAGNOSIS — E1122 Type 2 diabetes mellitus with diabetic chronic kidney disease: Secondary | ICD-10-CM | POA: Diagnosis not present

## 2022-11-14 DIAGNOSIS — I10 Essential (primary) hypertension: Secondary | ICD-10-CM | POA: Diagnosis not present

## 2022-11-14 DIAGNOSIS — E559 Vitamin D deficiency, unspecified: Secondary | ICD-10-CM | POA: Diagnosis not present

## 2022-11-24 ENCOUNTER — Other Ambulatory Visit (INDEPENDENT_AMBULATORY_CARE_PROVIDER_SITE_OTHER): Payer: Self-pay | Admitting: Nurse Practitioner

## 2022-11-24 DIAGNOSIS — K551 Chronic vascular disorders of intestine: Secondary | ICD-10-CM

## 2022-11-24 DIAGNOSIS — I6523 Occlusion and stenosis of bilateral carotid arteries: Secondary | ICD-10-CM

## 2022-11-25 ENCOUNTER — Ambulatory Visit (INDEPENDENT_AMBULATORY_CARE_PROVIDER_SITE_OTHER): Payer: Medicare HMO | Admitting: Vascular Surgery

## 2022-11-25 ENCOUNTER — Ambulatory Visit (INDEPENDENT_AMBULATORY_CARE_PROVIDER_SITE_OTHER): Payer: Medicare HMO

## 2022-11-25 ENCOUNTER — Encounter (INDEPENDENT_AMBULATORY_CARE_PROVIDER_SITE_OTHER): Payer: Self-pay | Admitting: Vascular Surgery

## 2022-11-25 VITALS — BP 174/73 | HR 55 | Resp 18 | Ht 64.0 in | Wt 181.0 lb

## 2022-11-25 DIAGNOSIS — K551 Chronic vascular disorders of intestine: Secondary | ICD-10-CM

## 2022-11-25 DIAGNOSIS — E785 Hyperlipidemia, unspecified: Secondary | ICD-10-CM

## 2022-11-25 DIAGNOSIS — E1159 Type 2 diabetes mellitus with other circulatory complications: Secondary | ICD-10-CM | POA: Diagnosis not present

## 2022-11-25 DIAGNOSIS — I6523 Occlusion and stenosis of bilateral carotid arteries: Secondary | ICD-10-CM | POA: Diagnosis not present

## 2022-11-25 DIAGNOSIS — I10 Essential (primary) hypertension: Secondary | ICD-10-CM | POA: Diagnosis not present

## 2022-11-25 NOTE — Assessment & Plan Note (Signed)
Carotid duplex today reveals stable, mild carotid artery disease on the 1 to 39% without significant progression from previous studies.  On appropriate medical therapy already for his mesenteric disease.  We will continue to follow this on an every other year basis with duplex unless further problems develop.

## 2022-11-25 NOTE — Progress Notes (Signed)
MRN : [id number]  Patrick Turner is a 77 y.o. ([birth year]) male who presents with chief complaint of  Chief Complaint  Patient presents with   Follow-up    Follow up 52mo mesenteric. 48yrs carotid.  Marland Kitchen  History of Present Illness: Patient returns today in follow up of multiple vascular issues.  He is doing well today.  He has occasional abdominal pain but nothing significant.  He is not having any unintentional weight loss or food fear.  His duplex today show only mildly elevated velocities in the SMA stent with a widely patent celiac and inferior mesenteric artery. He is also followed for his carotid disease.  No recent focal neurologic symptoms. Specifically, the patient denies amaurosis fugax, speech or swallowing difficulties, or arm or leg weakness or numbness. Carotid duplex today reveals stable, mild carotid artery disease on the 1 to 39% without significant progression from previous studies.  Current Outpatient Medications  Medication Sig Dispense Refill   alendronate (FOSAMAX) 70 MG tablet Take 70 mg by mouth once a week. Take with a full glass of water on an empty stomach.     amLODipine (NORVASC) 10 MG tablet Take 1 tablet (10 mg total) by mouth daily. 30 tablet 3   ascorbic acid (VITAMIN C) 500 MG tablet Take 500 mg by mouth daily.     aspirin 81 MG tablet Take 1 tablet (81 mg total) by mouth daily.     atorvastatin (LIPITOR) 80 MG tablet Take 80 mg by mouth daily.     carvedilol (COREG) 6.25 MG tablet Take 6.25 mg by mouth 2 (two) times daily with a meal.     clopidogrel (PLAVIX) 75 MG tablet Take 75 mg by mouth daily.     donepezil (ARICEPT) 5 MG tablet Take 1 tablet (5 mg total) by mouth at bedtime. 30 tablet 0   empagliflozin (JARDIANCE) 10 MG TABS tablet Take by mouth.     escitalopram (LEXAPRO) 10 MG tablet Take 10 mg by mouth daily.     gabapentin (NEURONTIN) 400 MG capsule Take 400 mg by mouth 2 (two) times daily.     insulin detemir (LEVEMIR) 100 UNIT/ML injection  Inject 15 Units into the skin at bedtime.     iron polysaccharides (NIFEREX) 150 MG capsule Take 150 mg by mouth daily.     metFORMIN (GLUCOPHAGE-XR) 500 MG 24 hr tablet Take 1,000 mg by mouth 2 (two) times daily.     Omega-3 Fatty Acids (FISH OIL) 1000 MG CAPS Take 1,000 mg by mouth daily.     OZEMPIC, 0.25 OR 0.5 MG/DOSE, 2 MG/3ML SOPN Inject into the skin.     pantoprazole (PROTONIX) 40 MG tablet Take 40 mg by mouth daily.     QUEtiapine (SEROQUEL) 100 MG tablet Take 100 mg by mouth at bedtime.     ranolazine (RANEXA) 1000 MG SR tablet TAKE 1 TABLET TWICE DAILY 180 tablet 0   Semaglutide,0.25 or 0.5MG /DOS, 2 MG/1.5ML SOPN Inject 0.5 mg into the skin every Wednesday.     valsartan (DIOVAN) 40 MG tablet Take 40 mg by mouth daily.     vitamin B-12 (CYANOCOBALAMIN) 1000 MCG tablet Take 1,000 mcg by mouth daily.     No current facility-administered medications for this visit.    Past Medical History:  Diagnosis Date   Altered mental status    Anemia    Anxiety    Aortic atherosclerosis (HCC)    Arthritis    Coronary artery disease    CRD (  chronic renal disease)    Depression    Diabetes mellitus without complication (HCC)    Encephalopathy acute    GERD (gastroesophageal reflux disease)    Headache    Hypercholesteremia    Hypertension    Neuropathy    Severe obesity (HCC)    Sleep apnea    Sleep terror    per patient this year per patient     Past Surgical History:  Procedure Laterality Date   APPENDECTOMY     CIRCUMCISION     COLONOSCOPY     COLONOSCOPY WITH PROPOFOL N/A 07/10/2018   Procedure: COLONOSCOPY WITH PROPOFOL;  Surgeon: Lollie Sails, MD;  Location: [facility] ENDOSCOPY;  Service: Endoscopy;  Laterality: N/A;   COLONOSCOPY WITH PROPOFOL N/A 04/23/2019   Procedure: COLONOSCOPY WITH PROPOFOL;  Surgeon: Lollie Sails, MD;  Location: [facility] ENDOSCOPY;  Service: Endoscopy;  Laterality: N/A;   CORONARY STENT INTERVENTION N/A 10/03/2019   Procedure: CORONARY STENT  INTERVENTION;  Surgeon: Yolonda Kida, MD;  Location: [place] CV LAB;  Service: Cardiovascular;  Laterality: N/A;   ESOPHAGOGASTRODUODENOSCOPY     ESOPHAGOGASTRODUODENOSCOPY (EGD) WITH PROPOFOL N/A 04/23/2019   Procedure: ESOPHAGOGASTRODUODENOSCOPY (EGD) WITH PROPOFOL;  Surgeon: Lollie Sails, MD;  Location: [facility] ENDOSCOPY;  Service: Endoscopy;  Laterality: N/A;   INTRAMEDULLARY (IM) NAIL INTERTROCHANTERIC Left 09/17/2021   Procedure: INTRAMEDULLARY (IM) NAIL INTERTROCHANTRIC;  Surgeon: Willaim Sheng, MD;  Location: [place];  Service: Orthopedics;  Laterality: Left;   JOINT REPLACEMENT     KNEE ARTHROPLASTY Right 06/27/2016   Procedure: COMPUTER ASSISTED TOTAL KNEE ARTHROPLASTY;  Surgeon: Dereck Leep, MD;  Location: [facility] ORS;  Service: Orthopedics;  Laterality: Right;   KNEE ARTHROSCOPY Right    LEFT HEART CATH AND CORONARY ANGIOGRAPHY N/A 10/03/2019   Procedure: Left Heart Cath and possible Coronary intervention;  Surgeon: Dionisio David, MD;  Location: [place] CV LAB;  Service: Cardiovascular;  Laterality: N/A;   TONSILLECTOMY     VISCERAL ANGIOGRAPHY N/A 05/23/2019   Procedure: VISCERAL ANGIOGRAPHY;  Surgeon: Algernon Huxley, MD;  Location: [place] CV LAB;  Service: Cardiovascular;  Laterality: N/A;   VISCERAL ANGIOGRAPHY N/A 11/04/2019   Procedure: VISCERAL ANGIOGRAPHY;  Surgeon: Algernon Huxley, MD;  Location: [place] CV LAB;  Service: Cardiovascular;  Laterality: N/A;   VISCERAL ANGIOGRAPHY N/A 12/14/2021   Procedure: VISCERAL ANGIOGRAPHY;  Surgeon: Algernon Huxley, MD;  Location: [place] CV LAB;  Service: Cardiovascular;  Laterality: N/A;     Social History   Tobacco Use   Smoking status: Never   Smokeless tobacco: Never  Vaping Use   Vaping Use: Never used  Substance Use Topics   Alcohol use: Yes    Comment: rare   Drug use: No      Family History  Problem Relation Age of Onset   Diabetes Mother    Heart disease Mother    Cancer Father     COPD Father      Allergies  Allergen Reactions   Penicillins Anaphylaxis    Has patient had a PCN reaction causing immediate rash, facial/tongue/throat swelling, SOB or lightheadedness with hypotension: Yes Has patient had a PCN reaction causing severe rash involving mucus membranes or skin necrosis: No Has patient had a PCN reaction that required hospitalization Yes Has patient had a PCN reaction occurring within the last 10 years: No If all of the above answers are "NO", then may proceed with Cephalosporin use.   Tavist [Clemastine] Swelling    REVIEW  OF SYSTEMS (Negative unless checked)   Constitutional: [x] Weight loss  [] Fever  [] Chills Cardiac: [] Chest pain   [] Chest pressure   [] Palpitations   [] Shortness of breath when laying flat   [] Shortness of breath at rest   [] Shortness of breath with exertion. Vascular:  [] Pain in legs with walking   [] Pain in legs at rest   [] Pain in legs when laying flat   [] Claudication   [] Pain in feet when walking  [] Pain in feet at rest  [] Pain in feet when laying flat   [] History of DVT   [] Phlebitis   [] Swelling in legs   [] Varicose veins   [] Non-healing ulcers Pulmonary:   [] Uses home oxygen   [] Productive cough   [] Hemoptysis   [] Wheeze  [] COPD   [] Asthma Neurologic:  [] Dizziness  [] Blackouts   [] Seizures   [] History of stroke   [] History of TIA  [] Aphasia   [] Temporary blindness   [] Dysphagia   [] Weakness or numbness in arms   [x] Weakness or numbness in legs Musculoskeletal:  [x] Arthritis   [] Joint swelling   [x] Joint pain   [] Low back pain Hematologic:  [] Easy bruising  [] Easy bleeding   [] Hypercoagulable state   [] Anemic   Gastrointestinal:  [] Blood in stool   [] Vomiting blood  [x] Gastroesophageal reflux/heartburn   [x] Abdominal pain Genitourinary:  [] Chronic kidney disease   [] Difficult urination  [] Frequent urination  [] Burning with urination   [] Hematuria Skin:  [] Rashes   [] Ulcers   [] Wounds Psychological:  [] History of anxiety   []   History of major depression.  Physical Examination  BP (!) 174/73 (BP Location: Right Arm)   Pulse (!) 55   Resp 18   Ht 5\' 4"  (1.626 m)   Wt 181 lb (82.1 kg)   BMI 31.07 kg/m  Gen:  WD/WN, NAD Head: [place]/AT, No temporalis wasting. Ear/Nose/Throat: Hearing grossly intact, nares w/o erythema or drainage Eyes: Conjunctiva clear. Sclera non-icteric Neck: Supple.  Trachea midline Pulmonary:  Good air movement, no use of accessory muscles.  Cardiac: RRR, no JVD Vascular:  Vessel Right Left  Radial Palpable Palpable               Musculoskeletal: M/S 5/5 throughout.  No deformity or atrophy. Mild LE edema. Neurologic: Sensation grossly intact in extremities.  Symmetrical.  Speech is fluent.  Psychiatric: Judgment intact, Mood & affect appropriate for pt's clinical situation. Dermatologic: No rashes or ulcers noted.  No cellulitis or open wounds.      Labs No results found for this or any previous visit (from the past 2160 hour(s)).  Radiology No results found.  Assessment/Plan HTN (hypertension), benign blood pressure control important in reducing the progression of atherosclerotic disease. On appropriate oral medications.     DM type 2 with diabetic peripheral neuropathy (HCC) blood glucose control important in reducing the progression of atherosclerotic disease. Also, involved in wound healing. On appropriate medications.   HLD (hyperlipidemia) lipid control important in reducing the progression of atherosclerotic disease. Continue statin therapy  Chronic mesenteric ischemia (HCC) His duplex today show only mildly elevated velocities in the SMA stent with a widely patent celiac and inferior mesenteric artery.  No worrisome symptoms at this point.  Continue dual antiplatelet therapy and statin agent.  As he has had to have reintervention, we will continue to follow this on 52-month intervals with duplex.  Bilateral carotid artery disease (HCC) Carotid duplex today  reveals stable, mild carotid artery disease on the 1 to 39% without significant progression from previous studies.  On appropriate  medical therapy already for his mesenteric disease.  We will continue to follow this on an every other year basis with duplex unless further problems develop.    [name], MD  11/25/2022 11:36 AM    This note was created with Dragon medical transcription system.  Any errors from dictation are purely unintentional

## 2022-11-25 NOTE — Assessment & Plan Note (Signed)
His duplex today show only mildly elevated velocities in the SMA stent with a widely patent celiac and inferior mesenteric artery.  No worrisome symptoms at this point.  Continue dual antiplatelet therapy and statin agent.  As he has had to have reintervention, we will continue to follow this on 46-month intervals with duplex.

## 2022-12-04 ENCOUNTER — Emergency Department
Admission: EM | Admit: 2022-12-04 | Discharge: 2022-12-04 | Disposition: A | Discharge status: Home / Self Care | Payer: Medicare HMO | Attending: Emergency Medicine | Admitting: Emergency Medicine

## 2022-12-04 ENCOUNTER — Other Ambulatory Visit: Payer: Self-pay

## 2022-12-04 ENCOUNTER — Encounter: Payer: Self-pay | Admitting: Emergency Medicine

## 2022-12-04 DIAGNOSIS — Z7984 Long term (current) use of oral hypoglycemic drugs: Secondary | ICD-10-CM | POA: Diagnosis not present

## 2022-12-04 DIAGNOSIS — R739 Hyperglycemia, unspecified: Secondary | ICD-10-CM | POA: Diagnosis not present

## 2022-12-04 DIAGNOSIS — E1165 Type 2 diabetes mellitus with hyperglycemia: Secondary | ICD-10-CM | POA: Diagnosis not present

## 2022-12-04 DIAGNOSIS — Z794 Long term (current) use of insulin: Secondary | ICD-10-CM | POA: Diagnosis not present

## 2022-12-04 DIAGNOSIS — E161 Other hypoglycemia: Secondary | ICD-10-CM | POA: Diagnosis not present

## 2022-12-04 DIAGNOSIS — R6889 Other general symptoms and signs: Secondary | ICD-10-CM

## 2022-12-04 DIAGNOSIS — I10 Essential (primary) hypertension: Secondary | ICD-10-CM | POA: Diagnosis not present

## 2022-12-04 DIAGNOSIS — E162 Hypoglycemia, unspecified: Secondary | ICD-10-CM | POA: Diagnosis not present

## 2022-12-04 LAB — URINALYSIS, ROUTINE W REFLEX MICROSCOPIC
Bacteria, UA: NONE SEEN
Bilirubin Urine: NEGATIVE
Glucose, UA: >=500 mg/dL — AB
Hgb urine dipstick: NEGATIVE
Ketones, ur: NEGATIVE mg/dL
Nitrite: NEGATIVE
Protein, ur: NEGATIVE mg/dL
Specific Gravity, Urine: 1.008 (ref 1.005–1.030)
pH: 7 (ref 5.0–8.0)

## 2022-12-04 LAB — BASIC METABOLIC PANEL
Anion gap: 4 — ABNORMAL LOW (ref 5–15)
BUN: 34 mg/dL — ABNORMAL HIGH (ref 8–23)
CO2: 24 mmol/L (ref 22–32)
Calcium: 8.1 mg/dL — ABNORMAL LOW (ref 8.9–10.3)
Chloride: 104 mmol/L (ref 98–111)
Creatinine, Ser: 1.49 mg/dL — ABNORMAL HIGH (ref 0.61–1.24)
GFR, Estimated: 48 mL/min — ABNORMAL LOW (ref >60)
Glucose, Bld: 309 mg/dL — ABNORMAL HIGH (ref 70–99)
Potassium: 4.2 mmol/L (ref 3.5–5.1)
Sodium: 132 mmol/L — ABNORMAL LOW (ref 135–145)

## 2022-12-04 LAB — CBC
HCT: 37.3 % — ABNORMAL LOW (ref 39.0–52.0)
Hemoglobin: 12 g/dL — ABNORMAL LOW (ref 13.0–17.0)
MCH: 31.3 pg (ref 26.0–34.0)
MCHC: 32.2 g/dL (ref 30.0–36.0)
MCV: 97.1 fL (ref 80.0–100.0)
Platelets: 246 10*3/uL (ref 150–400)
RBC: 3.84 MIL/uL — ABNORMAL LOW (ref 4.22–5.81)
RDW: 14.2 % (ref 11.5–15.5)
WBC: 5.9 10*3/uL (ref 4.0–10.5)
nRBC: 0 % (ref 0.0–0.2)

## 2022-12-04 LAB — CBG MONITORING, ED: Glucose-Capillary: 173 mg/dL — ABNORMAL HIGH (ref 70–99)

## 2022-12-04 MED ORDER — SODIUM CHLORIDE 0.9 % IV BOLUS
1000.0000 mL | Freq: Once | INTRAVENOUS | Status: AC
  Administered 2022-12-04: 1000 mL via INTRAVENOUS

## 2022-12-04 NOTE — Discharge Instructions (Signed)
Please seek medical attention for any high fevers, chest pain, shortness of breath, change in behavior, persistent vomiting, bloody stool or any other new or concerning symptoms.  

## 2022-12-04 NOTE — ED Notes (Signed)
Pt A&O x4, no obvious distress noted, respirations regular/unlabored. Pt verbalizes understanding of discharge instructions. Pt able to ambulate from ED independently.   

## 2022-12-04 NOTE — ED Triage Notes (Signed)
Pt to ED via [facility] from work for diabetic issue. Pt was at work and reported not feeling well. Per EMS pts CBG 348 on their arrival, when EMS rechecked pts CBG it was 400. Pt was given 400 cc NS in route. Pt hypertensive but in NAD.

## 2022-12-04 NOTE — ED Notes (Signed)
Pt up to restroom, pt uses a cane to get around. One person assist required.

## 2022-12-04 NOTE — ED Provider Notes (Signed)
[facility] Provider Note    Event Date/Time   First MD Initiated Contact with Patient 12/04/22 [phone]     (approximate)   History   Hyperglycemia   HPI  Patrick Turner is a 77 y.o. male who presents to the emergency department today from work because of concerns for not feeling well.  Patient does have a hard time describing what he means by this.  He does state that he started feeling weak and that he might fall out.  The patient says that he felt normal this morning while he was getting ready to go to work.  Does have a history of diabetes and is on both oral medications as well as insulin.  He says his blood sugar level is normal this morning. Denies any recent illness.     Physical Exam   Triage Vital Signs: ED Triage Vitals  Enc Vitals Group     BP 12/04/22 1637 (!) 182/59     Pulse Rate 12/04/22 1637 70     Resp 12/04/22 1637 18     Temp --      Temp src --      SpO2 12/04/22 1637 98 %     Weight --      Height --      Head Circumference --      Peak Flow --      Pain Score 12/04/22 1634 0     Pain Loc --      Pain Edu? --      Excl. in [place]? --     Most recent vital signs: Vitals:   12/04/22 1637  BP: (!) 182/59  Pulse: 70  Resp: 18  SpO2: 98%   General: Awake, alert, oriented. CV:  Good peripheral perfusion. Regular rate and rhythm. Resp:  Normal effort. Lungs clear. Abd:  No distention.    ED Results / Procedures / Treatments   Labs (all labs ordered are listed, but only abnormal results are displayed) Labs Reviewed  BASIC METABOLIC PANEL - Abnormal; Notable for the following components:      Result Value   Sodium 132 (*)    Glucose, Bld 309 (*)    BUN 34 (*)    Creatinine, Ser 1.49 (*)    Calcium 8.1 (*)    GFR, Estimated 48 (*)    Anion gap 4 (*)    All other components within normal limits  CBC - Abnormal; Notable for the following components:   RBC 3.84 (*)    Hemoglobin 12.0 (*)    HCT 37.3 (*)    All  other components within normal limits  URINALYSIS, ROUTINE W REFLEX MICROSCOPIC - Abnormal; Notable for the following components:   Color, Urine STRAW (*)    APPearance CLEAR (*)    Glucose, UA >=500 (*)    Leukocytes,Ua TRACE (*)    All other components within normal limits  CBG MONITORING, ED - Abnormal; Notable for the following components:   Glucose-Capillary 173 (*)    All other components within normal limits  CBG MONITORING, ED     EKG  I, Nance Pear, attending physician, personally viewed and interpreted this EKG  EKG Time: 1639 Rate: 70 Rhythm: sinus rhythm Axis: normal Intervals: qtc 437 QRS: narrow ST changes: no st elevation Impression: normal ekg    RADIOLOGY None   PROCEDURES:  Critical Care performed: No    MEDICATIONS ORDERED IN ED: Medications - No data to display  IMPRESSION / MDM / ASSESSMENT AND PLAN / ED COURSE  I reviewed the triage vital signs and the nursing notes.                              Differential diagnosis includes, but is not limited to, hyperglycemia, dehydration, infection, DKA.  Patient's presentation is most consistent with acute presentation with potential threat to life or bodily function.   The patient is on the cardiac monitor to evaluate for evidence of arrhythmia and/or significant heart rate changes.  Patient presented to the emergency department today from work because of feeling bad.  Patient does have a hard time describing what he means by this.  Patient was found to be hyperglycemic.  Blood work without concerning electrolyte abnormalities to suggest DKA.  No concerning leukocytosis or anemia.  Patient was given IV fluids and blood sugars did improve.  Patient stated he felt better although still felt a little bad.  At this time he was able to say it was that he felt a little dizzy.  He however says that this happens somewhat frequently.  He feels if he was able to go home he would be able to rest and feel  better.  At this time I think that is a reasonable plan.  Will plan on discharging.      FINAL CLINICAL IMPRESSION(S) / ED DIAGNOSES   Final diagnoses:  Hyperglycemia  Feeling bad    Note:  This document was prepared using Dragon voice recognition software and may include unintentional dictation errors.    Nance Pear, MD 12/04/22 2007

## 2022-12-08 ENCOUNTER — Telehealth: Payer: Self-pay

## 2022-12-08 NOTE — Telephone Encounter (Signed)
        Patient  visited [place] on 3/27     Telephone encounter attempt :  1st  A HIPAA compliant voice message was left requesting a return call.  Instructed patient to call back .    [facility] [phone] [address], [place], [place] [address] Phone: [phone] Email: [email or web address]

## 2022-12-09 ENCOUNTER — Telehealth: Payer: Self-pay

## 2022-12-09 DIAGNOSIS — F332 Major depressive disorder, recurrent severe without psychotic features: Secondary | ICD-10-CM | POA: Diagnosis not present

## 2022-12-09 DIAGNOSIS — F418 Other specified anxiety disorders: Secondary | ICD-10-CM | POA: Diagnosis not present

## 2022-12-09 DIAGNOSIS — F02A2 Dementia in other diseases classified elsewhere, mild, with psychotic disturbance: Secondary | ICD-10-CM | POA: Diagnosis not present

## 2022-12-09 NOTE — Telephone Encounter (Signed)
     Patient  visit on 3/24  at [place]    Have you been able to follow up with your primary care physician? No   The patient was or was not able to obtain any needed medicine or equipment. Yes   Are there diet recommendations that you are having difficulty following? Na   Patient expresses understanding of discharge instructions and education provided has no other needs at this time.  Yes      [facility] [phone] [address], [place], [place] [address] Phone: [phone] Email: [email or web address]

## 2022-12-20 ENCOUNTER — Encounter (INDEPENDENT_AMBULATORY_CARE_PROVIDER_SITE_OTHER): Payer: Medicare HMO

## 2022-12-20 ENCOUNTER — Ambulatory Visit (INDEPENDENT_AMBULATORY_CARE_PROVIDER_SITE_OTHER): Payer: Medicare HMO | Admitting: Vascular Surgery

## 2022-12-22 DIAGNOSIS — F418 Other specified anxiety disorders: Secondary | ICD-10-CM | POA: Diagnosis not present

## 2022-12-22 DIAGNOSIS — F332 Major depressive disorder, recurrent severe without psychotic features: Secondary | ICD-10-CM | POA: Diagnosis not present

## 2022-12-22 DIAGNOSIS — F02A2 Dementia in other diseases classified elsewhere, mild, with psychotic disturbance: Secondary | ICD-10-CM | POA: Diagnosis not present

## 2022-12-23 DIAGNOSIS — E113313 Type 2 diabetes mellitus with moderate nonproliferative diabetic retinopathy with macular edema, bilateral: Secondary | ICD-10-CM | POA: Diagnosis not present

## 2023-01-09 ENCOUNTER — Other Ambulatory Visit: Payer: Self-pay | Admitting: Cardiovascular Disease

## 2023-01-09 DIAGNOSIS — I2510 Atherosclerotic heart disease of native coronary artery without angina pectoris: Secondary | ICD-10-CM

## 2023-01-31 DIAGNOSIS — E113313 Type 2 diabetes mellitus with moderate nonproliferative diabetic retinopathy with macular edema, bilateral: Secondary | ICD-10-CM | POA: Diagnosis not present

## 2023-02-03 ENCOUNTER — Encounter: Payer: Self-pay | Admitting: Cardiovascular Disease

## 2023-02-03 ENCOUNTER — Ambulatory Visit: Payer: Medicare HMO | Admitting: Cardiovascular Disease

## 2023-02-03 VITALS — BP 128/50 | HR 64 | Ht 65.0 in | Wt 185.0 lb

## 2023-02-03 DIAGNOSIS — I10 Essential (primary) hypertension: Secondary | ICD-10-CM

## 2023-02-03 DIAGNOSIS — E782 Mixed hyperlipidemia: Secondary | ICD-10-CM | POA: Diagnosis not present

## 2023-02-03 DIAGNOSIS — I350 Nonrheumatic aortic (valve) stenosis: Secondary | ICD-10-CM | POA: Diagnosis not present

## 2023-02-03 DIAGNOSIS — I2510 Atherosclerotic heart disease of native coronary artery without angina pectoris: Secondary | ICD-10-CM | POA: Diagnosis not present

## 2023-02-03 MED ORDER — EMPAGLIFLOZIN 10 MG PO TABS: 10.0000 mg | ORAL_TABLET | Freq: Every day | ORAL | 0 refills | Status: DC

## 2023-02-03 NOTE — Progress Notes (Signed)
Cardiology Office Note   Date:  02/03/2023   ID:  BRIAR STPIERRE, DOB 02-22-1946, MRN [id number]  PCP:  Lauro Regulus, MD  Cardiologist:  Adrian Blackwater, MD      History of Present Illness: Patrick Turner is a 77 y.o. male who presents for  Chief Complaint  Patient presents with   Follow-up    3 months follow up    Patient in office for routine cardiac exam. Denies chest pain, shortness of breath, edema, palpitations. Complains of orthostatic dizziness.     Past Medical History:  Diagnosis Date   Altered mental status    Anemia    Anxiety    Aortic atherosclerosis (HCC)    Arthritis    Coronary artery disease    CRD (chronic renal disease)    Depression    Diabetes mellitus without complication (HCC)    Encephalopathy acute    GERD (gastroesophageal reflux disease)    Headache    Hypercholesteremia    Hypertension    Neuropathy    Severe obesity (HCC)    Sleep apnea    Sleep terror    per patient this year per patient      Past Surgical History:  Procedure Laterality Date   APPENDECTOMY     CIRCUMCISION     COLONOSCOPY     COLONOSCOPY WITH PROPOFOL N/A 07/10/2018   Procedure: COLONOSCOPY WITH PROPOFOL;  Surgeon: Christena Deem, MD;  Location: [facility] ENDOSCOPY;  Service: Endoscopy;  Laterality: N/A;   COLONOSCOPY WITH PROPOFOL N/A 04/23/2019   Procedure: COLONOSCOPY WITH PROPOFOL;  Surgeon: Christena Deem, MD;  Location: [facility] ENDOSCOPY;  Service: Endoscopy;  Laterality: N/A;   CORONARY STENT INTERVENTION N/A 10/03/2019   Procedure: CORONARY STENT INTERVENTION;  Surgeon: Alwyn Pea, MD;  Location: [facility] INVASIVE CV LAB;  Service: Cardiovascular;  Laterality: N/A;   ESOPHAGOGASTRODUODENOSCOPY     ESOPHAGOGASTRODUODENOSCOPY (EGD) WITH PROPOFOL N/A 04/23/2019   Procedure: ESOPHAGOGASTRODUODENOSCOPY (EGD) WITH PROPOFOL;  Surgeon: Christena Deem, MD;  Location: [facility] ENDOSCOPY;  Service: Endoscopy;  Laterality: N/A;   INTRAMEDULLARY (IM)  NAIL INTERTROCHANTERIC Left 09/17/2021   Procedure: INTRAMEDULLARY (IM) NAIL INTERTROCHANTRIC;  Surgeon: Joen Laura, MD;  Location: [facility] OR;  Service: Orthopedics;  Laterality: Left;   JOINT REPLACEMENT     KNEE ARTHROPLASTY Right 06/27/2016   Procedure: COMPUTER ASSISTED TOTAL KNEE ARTHROPLASTY;  Surgeon: Donato Heinz, MD;  Location: [facility] ORS;  Service: Orthopedics;  Laterality: Right;   KNEE ARTHROSCOPY Right    LEFT HEART CATH AND CORONARY ANGIOGRAPHY N/A 10/03/2019   Procedure: Left Heart Cath and possible Coronary intervention;  Surgeon: Laurier Nancy, MD;  Location: [facility] INVASIVE CV LAB;  Service: Cardiovascular;  Laterality: N/A;   TONSILLECTOMY     VISCERAL ANGIOGRAPHY N/A 05/23/2019   Procedure: VISCERAL ANGIOGRAPHY;  Surgeon: Annice Needy, MD;  Location: [facility] INVASIVE CV LAB;  Service: Cardiovascular;  Laterality: N/A;   VISCERAL ANGIOGRAPHY N/A 11/04/2019   Procedure: VISCERAL ANGIOGRAPHY;  Surgeon: Annice Needy, MD;  Location: [facility] INVASIVE CV LAB;  Service: Cardiovascular;  Laterality: N/A;   VISCERAL ANGIOGRAPHY N/A 12/14/2021   Procedure: VISCERAL ANGIOGRAPHY;  Surgeon: Annice Needy, MD;  Location: [facility] INVASIVE CV LAB;  Service: Cardiovascular;  Laterality: N/A;     Current Outpatient Medications  Medication Sig Dispense Refill   alendronate (FOSAMAX) 70 MG tablet Take 70 mg by mouth once a week. Take with a full glass of water on an empty stomach.  amLODipine (NORVASC) 10 MG tablet Take 1 tablet (10 mg total) by mouth daily. 30 tablet 3   ascorbic acid (VITAMIN C) 500 MG tablet Take 500 mg by mouth daily.     aspirin 81 MG tablet Take 1 tablet (81 mg total) by mouth daily.     atorvastatin (LIPITOR) 80 MG tablet Take 80 mg by mouth daily.     carvedilol (COREG) 6.25 MG tablet Take 6.25 mg by mouth 2 (two) times daily with a meal.     clopidogrel (PLAVIX) 75 MG tablet Take 75 mg by mouth daily.     donepezil (ARICEPT) 5 MG tablet Take 1 tablet (5 mg total) by mouth  at bedtime. 30 tablet 0   empagliflozin (JARDIANCE) 10 MG TABS tablet Take 1 tablet (10 mg total) by mouth daily. 90 tablet 0   escitalopram (LEXAPRO) 10 MG tablet Take 10 mg by mouth daily.     gabapentin (NEURONTIN) 400 MG capsule Take 400 mg by mouth 2 (two) times daily.     insulin detemir (LEVEMIR) 100 UNIT/ML injection Inject 15 Units into the skin at bedtime.     iron polysaccharides (NIFEREX) 150 MG capsule Take 150 mg by mouth daily.     metFORMIN (GLUCOPHAGE-XR) 500 MG 24 hr tablet Take 1,000 mg by mouth 2 (two) times daily.     Omega-3 Fatty Acids (FISH OIL) 1000 MG CAPS Take 1,000 mg by mouth daily.     OZEMPIC, 0.25 OR 0.5 MG/DOSE, 2 MG/3ML SOPN Inject into the skin.     pantoprazole (PROTONIX) 40 MG tablet Take 40 mg by mouth daily.     QUEtiapine (SEROQUEL) 100 MG tablet Take 100 mg by mouth at bedtime.     ranolazine (RANEXA) 1000 MG SR tablet TAKE 1 TABLET TWICE DAILY 180 tablet 1   Semaglutide,0.25 or 0.5MG /DOS, 2 MG/1.5ML SOPN Inject 0.5 mg into the skin every Wednesday.     valsartan (DIOVAN) 40 MG tablet Take 40 mg by mouth daily.     vitamin B-12 (CYANOCOBALAMIN) 1000 MCG tablet Take 1,000 mcg by mouth daily.     No current facility-administered medications for this visit.    Allergies:   Penicillins and Tavist [clemastine]    Social History:   reports that he has never smoked. He has never used smokeless tobacco. He reports current alcohol use. He reports that he does not use drugs.   Family History:  family history includes COPD in his father; Cancer in his father; Diabetes in his mother; Heart disease in his mother.    ROS:     Review of Systems  Constitutional: Negative.   HENT: Negative.    Eyes: Negative.   Respiratory: Negative.    Cardiovascular: Negative.   Gastrointestinal: Negative.   Genitourinary: Negative.   Musculoskeletal: Negative.   Skin: Negative.   Neurological: Negative.   Endo/Heme/Allergies: Negative.   Psychiatric/Behavioral:  Negative.    All other systems reviewed and are negative.    All other systems are reviewed and negative.    PHYSICAL EXAM: VS:  BP (!) 128/50   Pulse 64   Ht 5\' 5"  (1.651 m)   Wt 185 lb (83.9 kg)   SpO2 98%   BMI 30.79 kg/m  , BMI Body mass index is 30.79 kg/m. Last weight:  Wt Readings from Last 3 Encounters:  02/03/23 185 lb (83.9 kg)  12/04/22 180 lb 12.4 oz (82 kg)  11/25/22 181 lb (82.1 kg)     Physical Exam Vitals reviewed.  Constitutional:      Appearance: Normal appearance. He is normal weight.  HENT:     Head: Normocephalic.     Nose: Nose normal.     Mouth/Throat:     Mouth: Mucous membranes are moist.  Eyes:     Pupils: Pupils are equal, round, and reactive to light.  Cardiovascular:     Rate and Rhythm: Normal rate and regular rhythm.     Pulses: Normal pulses.     Heart sounds: Murmur heard.  Pulmonary:     Effort: Pulmonary effort is normal.  Abdominal:     General: Abdomen is flat. Bowel sounds are normal.  Musculoskeletal:        General: Normal range of motion.     Cervical back: Normal range of motion.  Skin:    General: Skin is warm.  Neurological:     General: No focal deficit present.     Mental Status: He is alert.  Psychiatric:        Mood and Affect: Mood normal.     EKG: none today  Recent Labs: 07/06/2022: ALT 9 12/04/2022: BUN 34; Creatinine, Ser 1.49; Hemoglobin 12.0; Platelets 246; Potassium 4.2; Sodium 132    Lipid Panel    Component Value Date/Time   CHOL 120 06/25/2017 0347   TRIG 136 06/25/2017 0347   HDL 50 06/25/2017 0347   CHOLHDL 2.4 06/25/2017 0347   VLDL 27 06/25/2017 0347   LDLCALC 43 06/25/2017 0347     ASSESSMENT AND PLAN:    ICD-10-CM   1. 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 HTN (hypertension), benign  I10     3. Mixed hyperlipidemia  E78.2     4. Nonrheumatic aortic valve stenosis  I35.0        Problem List Items Addressed This Visit       Cardiovascular and  Mediastinum   HTN (hypertension), benign    Well controlled. Continue same medications.       ASCVD (arteriosclerotic cardiovascular disease) - Primary    Stress test 02/2022 normal. Denies chest pain, shortness of breath.       Nonrheumatic aortic valve stenosis    03/2022 Aortic Valve has Mild to Moderate Regurgitation, mild AS        Other   HLD (hyperlipidemia)    10/2022 LDL 25. Continue Lipitor 80 mg.        Disposition:   Return in about 3 months (around 05/06/2023).    Total time spent: 30 minutes  Signed,  Adrian Blackwater, MD  02/03/2023 10:09 AM    [facility]

## 2023-02-03 NOTE — Assessment & Plan Note (Signed)
Well controlled. Continue same medications. 

## 2023-02-03 NOTE — Assessment & Plan Note (Signed)
Stress test 02/2022 normal. Denies chest pain, shortness of breath.  

## 2023-02-03 NOTE — Assessment & Plan Note (Signed)
10/2022 LDL 25. Continue Lipitor 80 mg.

## 2023-02-03 NOTE — Assessment & Plan Note (Signed)
03/2022 Aortic Valve has Mild to Moderate Regurgitation, mild AS

## 2023-02-07 ENCOUNTER — Inpatient Hospital Stay
Admission: EM | Admit: 2023-02-07 | Discharge: 2023-02-09 | DRG: 071 | Disposition: A | Discharge status: Home / Self Care | Payer: Medicare HMO | Attending: Internal Medicine | Admitting: Internal Medicine

## 2023-02-07 ENCOUNTER — Encounter: Payer: Self-pay | Admitting: Internal Medicine

## 2023-02-07 ENCOUNTER — Emergency Department: Payer: Medicare HMO

## 2023-02-07 DIAGNOSIS — Z7984 Long term (current) use of oral hypoglycemic drugs: Secondary | ICD-10-CM | POA: Diagnosis not present

## 2023-02-07 DIAGNOSIS — Z8249 Family history of ischemic heart disease and other diseases of the circulatory system: Secondary | ICD-10-CM | POA: Diagnosis not present

## 2023-02-07 DIAGNOSIS — F32A Depression, unspecified: Secondary | ICD-10-CM | POA: Diagnosis present

## 2023-02-07 DIAGNOSIS — R410 Disorientation, unspecified: Principal | ICD-10-CM

## 2023-02-07 DIAGNOSIS — I2510 Atherosclerotic heart disease of native coronary artery without angina pectoris: Secondary | ICD-10-CM | POA: Diagnosis present

## 2023-02-07 DIAGNOSIS — K219 Gastro-esophageal reflux disease without esophagitis: Secondary | ICD-10-CM | POA: Diagnosis present

## 2023-02-07 DIAGNOSIS — Z7983 Long term (current) use of bisphosphonates: Secondary | ICD-10-CM

## 2023-02-07 DIAGNOSIS — R4182 Altered mental status, unspecified: Secondary | ICD-10-CM | POA: Diagnosis not present

## 2023-02-07 DIAGNOSIS — N1831 Chronic kidney disease, stage 3a: Secondary | ICD-10-CM | POA: Diagnosis present

## 2023-02-07 DIAGNOSIS — E669 Obesity, unspecified: Secondary | ICD-10-CM | POA: Diagnosis not present

## 2023-02-07 DIAGNOSIS — Z825 Family history of asthma and other chronic lower respiratory diseases: Secondary | ICD-10-CM

## 2023-02-07 DIAGNOSIS — Z7985 Long-term (current) use of injectable non-insulin antidiabetic drugs: Secondary | ICD-10-CM

## 2023-02-07 DIAGNOSIS — E1165 Type 2 diabetes mellitus with hyperglycemia: Secondary | ICD-10-CM | POA: Diagnosis not present

## 2023-02-07 DIAGNOSIS — Z794 Long term (current) use of insulin: Secondary | ICD-10-CM

## 2023-02-07 DIAGNOSIS — E11319 Type 2 diabetes mellitus with unspecified diabetic retinopathy without macular edema: Secondary | ICD-10-CM | POA: Diagnosis present

## 2023-02-07 DIAGNOSIS — Z96651 Presence of right artificial knee joint: Secondary | ICD-10-CM | POA: Diagnosis present

## 2023-02-07 DIAGNOSIS — I129 Hypertensive chronic kidney disease with stage 1 through stage 4 chronic kidney disease, or unspecified chronic kidney disease: Secondary | ICD-10-CM | POA: Diagnosis present

## 2023-02-07 DIAGNOSIS — I350 Nonrheumatic aortic (valve) stenosis: Secondary | ICD-10-CM | POA: Diagnosis present

## 2023-02-07 DIAGNOSIS — K551 Chronic vascular disorders of intestine: Secondary | ICD-10-CM | POA: Diagnosis present

## 2023-02-07 DIAGNOSIS — F03A0 Unspecified dementia, mild, without behavioral disturbance, psychotic disturbance, mood disturbance, and anxiety: Secondary | ICD-10-CM | POA: Diagnosis not present

## 2023-02-07 DIAGNOSIS — Z833 Family history of diabetes mellitus: Secondary | ICD-10-CM

## 2023-02-07 DIAGNOSIS — D631 Anemia in chronic kidney disease: Secondary | ICD-10-CM | POA: Diagnosis present

## 2023-02-07 DIAGNOSIS — I10 Essential (primary) hypertension: Secondary | ICD-10-CM | POA: Diagnosis not present

## 2023-02-07 DIAGNOSIS — G9341 Metabolic encephalopathy: Secondary | ICD-10-CM | POA: Diagnosis not present

## 2023-02-07 DIAGNOSIS — Z880 Allergy status to penicillin: Secondary | ICD-10-CM

## 2023-02-07 DIAGNOSIS — G4730 Sleep apnea, unspecified: Secondary | ICD-10-CM | POA: Diagnosis present

## 2023-02-07 DIAGNOSIS — Z8673 Personal history of transient ischemic attack (TIA), and cerebral infarction without residual deficits: Secondary | ICD-10-CM

## 2023-02-07 DIAGNOSIS — Z955 Presence of coronary angioplasty implant and graft: Secondary | ICD-10-CM

## 2023-02-07 DIAGNOSIS — I6523 Occlusion and stenosis of bilateral carotid arteries: Secondary | ICD-10-CM | POA: Diagnosis not present

## 2023-02-07 DIAGNOSIS — Z888 Allergy status to other drugs, medicaments and biological substances status: Secondary | ICD-10-CM | POA: Diagnosis not present

## 2023-02-07 DIAGNOSIS — E7800 Pure hypercholesterolemia, unspecified: Secondary | ICD-10-CM | POA: Diagnosis present

## 2023-02-07 DIAGNOSIS — E875 Hyperkalemia: Secondary | ICD-10-CM | POA: Diagnosis present

## 2023-02-07 DIAGNOSIS — N1832 Chronic kidney disease, stage 3b: Secondary | ICD-10-CM | POA: Diagnosis not present

## 2023-02-07 DIAGNOSIS — R296 Repeated falls: Secondary | ICD-10-CM | POA: Diagnosis present

## 2023-02-07 DIAGNOSIS — R569 Unspecified convulsions: Secondary | ICD-10-CM | POA: Diagnosis not present

## 2023-02-07 DIAGNOSIS — Z79899 Other long term (current) drug therapy: Secondary | ICD-10-CM

## 2023-02-07 DIAGNOSIS — I6389 Other cerebral infarction: Secondary | ICD-10-CM | POA: Diagnosis not present

## 2023-02-07 DIAGNOSIS — R400 Somnolence: Secondary | ICD-10-CM | POA: Diagnosis not present

## 2023-02-07 DIAGNOSIS — E1122 Type 2 diabetes mellitus with diabetic chronic kidney disease: Secondary | ICD-10-CM | POA: Diagnosis present

## 2023-02-07 DIAGNOSIS — R4781 Slurred speech: Secondary | ICD-10-CM | POA: Diagnosis not present

## 2023-02-07 DIAGNOSIS — Z6831 Body mass index (BMI) 31.0-31.9, adult: Secondary | ICD-10-CM

## 2023-02-07 DIAGNOSIS — Z809 Family history of malignant neoplasm, unspecified: Secondary | ICD-10-CM

## 2023-02-07 DIAGNOSIS — I2583 Coronary atherosclerosis due to lipid rich plaque: Secondary | ICD-10-CM | POA: Diagnosis not present

## 2023-02-07 DIAGNOSIS — E1129 Type 2 diabetes mellitus with other diabetic kidney complication: Secondary | ICD-10-CM | POA: Diagnosis present

## 2023-02-07 DIAGNOSIS — Z7902 Long term (current) use of antithrombotics/antiplatelets: Secondary | ICD-10-CM

## 2023-02-07 DIAGNOSIS — E1142 Type 2 diabetes mellitus with diabetic polyneuropathy: Secondary | ICD-10-CM | POA: Diagnosis not present

## 2023-02-07 DIAGNOSIS — E1140 Type 2 diabetes mellitus with diabetic neuropathy, unspecified: Secondary | ICD-10-CM | POA: Diagnosis present

## 2023-02-07 DIAGNOSIS — Z7982 Long term (current) use of aspirin: Secondary | ICD-10-CM

## 2023-02-07 DIAGNOSIS — N189 Chronic kidney disease, unspecified: Secondary | ICD-10-CM | POA: Diagnosis present

## 2023-02-07 LAB — COMPREHENSIVE METABOLIC PANEL
ALT: 13 U/L (ref 0–44)
AST: 19 U/L (ref 15–41)
Albumin: 4.2 g/dL (ref 3.5–5.0)
Alkaline Phosphatase: 78 U/L (ref 38–126)
Anion gap: 8 (ref 5–15)
BUN: 32 mg/dL — ABNORMAL HIGH (ref 8–23)
CO2: 22 mmol/L (ref 22–32)
Calcium: 8.6 mg/dL — ABNORMAL LOW (ref 8.9–10.3)
Chloride: 107 mmol/L (ref 98–111)
Creatinine, Ser: 1.66 mg/dL — ABNORMAL HIGH (ref 0.61–1.24)
GFR, Estimated: 42 mL/min — ABNORMAL LOW (ref >60)
Glucose, Bld: 154 mg/dL — ABNORMAL HIGH (ref 70–99)
Potassium: 5.4 mmol/L — ABNORMAL HIGH (ref 3.5–5.1)
Sodium: 137 mmol/L (ref 135–145)
Total Bilirubin: 0.8 mg/dL (ref 0.3–1.2)
Total Protein: 7.2 g/dL (ref 6.5–8.1)

## 2023-02-07 LAB — CBC
HCT: 39.4 % (ref 39.0–52.0)
Hemoglobin: 12.7 g/dL — ABNORMAL LOW (ref 13.0–17.0)
MCH: 31.6 pg (ref 26.0–34.0)
MCHC: 32.2 g/dL (ref 30.0–36.0)
MCV: 98 fL (ref 80.0–100.0)
Platelets: 244 10*3/uL (ref 150–400)
RBC: 4.02 MIL/uL — ABNORMAL LOW (ref 4.22–5.81)
RDW: 14 % (ref 11.5–15.5)
WBC: 6.8 10*3/uL (ref 4.0–10.5)
nRBC: 0 % (ref 0.0–0.2)

## 2023-02-07 LAB — PROTIME-INR
INR: 1 (ref 0.8–1.2)
Prothrombin Time: 13.7 seconds (ref 11.4–15.2)

## 2023-02-07 LAB — DIFFERENTIAL
Abs Immature Granulocytes: 0.04 10*3/uL (ref 0.00–0.07)
Basophils Absolute: 0 10*3/uL (ref 0.0–0.1)
Basophils Relative: 0 %
Eosinophils Absolute: 0.2 10*3/uL (ref 0.0–0.5)
Eosinophils Relative: 4 %
Immature Granulocytes: 1 %
Lymphocytes Relative: 22 %
Lymphs Abs: 1.5 10*3/uL (ref 0.7–4.0)
Monocytes Absolute: 0.6 10*3/uL (ref 0.1–1.0)
Monocytes Relative: 9 %
Neutro Abs: 4.4 10*3/uL (ref 1.7–7.7)
Neutrophils Relative %: 64 %

## 2023-02-07 LAB — LACTIC ACID, PLASMA: Lactic Acid, Venous: 1.2 mmol/L (ref 0.5–1.9)

## 2023-02-07 LAB — APTT: aPTT: 28 seconds (ref 24–36)

## 2023-02-07 LAB — ETHANOL: Alcohol, Ethyl (B): <10 mg/dL (ref <10)

## 2023-02-07 LAB — AMMONIA: Ammonia: 18 umol/L (ref 9–35)

## 2023-02-07 LAB — CBG MONITORING, ED: Glucose-Capillary: 146 mg/dL — ABNORMAL HIGH (ref 70–99)

## 2023-02-07 MED ORDER — SODIUM CHLORIDE 0.9 % IV SOLN: INTRAVENOUS | Status: DC

## 2023-02-07 MED ORDER — ASPIRIN 300 MG RE SUPP: 300.0000 mg | Freq: Every day | RECTAL | Status: DC

## 2023-02-07 MED ORDER — ASPIRIN 325 MG PO TABS: 325.0000 mg | ORAL_TABLET | Freq: Every day | ORAL | Status: DC

## 2023-02-07 MED ORDER — ACETAMINOPHEN 325 MG PO TABS: 650.0000 mg | ORAL_TABLET | ORAL | Status: DC | PRN

## 2023-02-07 MED ORDER — SODIUM CHLORIDE 0.9% FLUSH
3.0000 mL | Freq: Once | INTRAVENOUS | Status: AC
  Administered 2023-02-07: 3 mL via INTRAVENOUS

## 2023-02-07 MED ORDER — HEPARIN SODIUM (PORCINE) 5000 UNIT/ML IJ SOLN
5000.0000 [IU] | Freq: Three times a day (TID) | INTRAMUSCULAR | Status: DC
  Administered 2023-02-08 – 2023-02-09 (×4): 5000 [IU] via SUBCUTANEOUS
  Filled 2023-02-07 (×4): qty 1

## 2023-02-07 MED ORDER — INSULIN ASPART 100 UNIT/ML IJ SOLN
0.0000 [IU] | Freq: Three times a day (TID) | INTRAMUSCULAR | Status: DC
  Administered 2023-02-08: 1 [IU] via SUBCUTANEOUS
  Administered 2023-02-08: 2 [IU] via SUBCUTANEOUS
  Administered 2023-02-09: 1 [IU] via SUBCUTANEOUS
  Filled 2023-02-07 (×3): qty 1

## 2023-02-07 MED ORDER — ASPIRIN 81 MG PO CHEW
324.0000 mg | CHEWABLE_TABLET | Freq: Once | ORAL | Status: AC
  Administered 2023-02-07: 324 mg via ORAL
  Filled 2023-02-07: qty 4

## 2023-02-07 MED ORDER — ACETAMINOPHEN 650 MG RE SUPP: 650.0000 mg | RECTAL | Status: DC | PRN

## 2023-02-07 MED ORDER — ACETAMINOPHEN 160 MG/5ML PO SOLN: 650.0000 mg | ORAL | Status: DC | PRN

## 2023-02-07 MED ORDER — STROKE: EARLY STAGES OF RECOVERY BOOK: Freq: Once | Status: AC

## 2023-02-07 NOTE — ED Provider Notes (Signed)
[facility] Provider Note    Event Date/Time   First MD Initiated Contact with Patient 02/07/23 2052     (approximate)   History   Confusion  HPI  Patrick Turner is a 77 y.o. male patient's work where he was noted to have sudden evidence of confusion.  This occurred approximately 8 PM today.  Patient was initiated as a possible code stroke due to associated concerns for possible nystagmus and confusion.  On arrival patient reports no particular concerns.  He denies any numbness headache chest pain trouble breathing or weakness.  He is noted to have a history of hypertension carotid artery disease, coronary disease type 2 diabetes anemia     Physical Exam   Triage Vital Signs: ED Triage Vitals [02/07/23 2100]  Enc Vitals Group     BP (!) 163/66     Pulse Rate 63     Resp 12     Temp      Temp src      SpO2 94 %     Weight      Height      Head Circumference      Peak Flow      Pain Score      Pain Loc      Pain Edu?      Excl. in GC?     Most recent vital signs: Vitals:   02/09/23 0719 02/09/23 0920  BP: (!) 143/53   Pulse: (!) 57 62  Resp: 18   Temp: 98.2 F (36.8 C)   SpO2: 97%      General: Awake, no distress.  Is oriented to being in the hospital and self.  He is in no distress.  No obvious facial droop or good symmetry with movement of the face.  Cranial nerve exam seems normal except when he does look to the far left or right he has slight nystagmus with gaze but no fixed or not extinguishing nystagmus. CV:  Good peripheral perfusion.  Normal tones and perfusion Resp:  Normal effort.  Clear bilateral normal work of breathing Abd:  No distention.  Other:  Moves all extremities well.  Does seem slightly confused, he is a little bit slow and following commands both extremities, but he does not have any obvious exam findings and to demonstrate any clear focality.   ED Results / Procedures / Treatments   Labs (all labs  ordered are listed, but only abnormal results are displayed) Labs Reviewed  CBC - Abnormal; Notable for the following components:      Result Value   RBC 4.02 (*)    Hemoglobin 12.7 (*)    All other components within normal limits  COMPREHENSIVE METABOLIC PANEL - Abnormal; Notable for the following components:   Potassium 5.4 (*)    Glucose, Bld 154 (*)    BUN 32 (*)    Creatinine, Ser 1.66 (*)    Calcium 8.6 (*)    GFR, Estimated 42 (*)    All other components within normal limits  URINALYSIS, ROUTINE W REFLEX MICROSCOPIC - Abnormal; Notable for the following components:   Color, Urine YELLOW (*)    APPearance CLEAR (*)    Glucose, UA >=500 (*)    Ketones, ur 5 (*)    All other components within normal limits  LIPID PANEL - Abnormal; Notable for the following components:   HDL 40 (*)    All other components within normal limits  HEMOGLOBIN A1C - Abnormal;  Notable for the following components:   Hgb A1c MFr Bld 7.3 (*)    All other components within normal limits  URINE DRUG SCREEN, QUALITATIVE ([facility] ONLY) - Abnormal; Notable for the following components:   Tricyclic, Ur Screen POSITIVE (*)    All other components within normal limits  CBC WITH DIFFERENTIAL/PLATELET - Abnormal; Notable for the following components:   RBC 3.70 (*)    Hemoglobin 11.7 (*)    HCT 36.0 (*)    All other components within normal limits  COMPREHENSIVE METABOLIC PANEL - Abnormal; Notable for the following components:   CO2 21 (*)    Glucose, Bld 108 (*)    BUN 32 (*)    Creatinine, Ser 1.53 (*)    Calcium 8.8 (*)    GFR, Estimated 47 (*)    All other components within normal limits  GLUCOSE, CAPILLARY - Abnormal; Notable for the following components:   Glucose-Capillary 141 (*)    All other components within normal limits  GLUCOSE, CAPILLARY - Abnormal; Notable for the following components:   Glucose-Capillary 149 (*)    All other components within normal limits  GLUCOSE, CAPILLARY - Abnormal;  Notable for the following components:   Glucose-Capillary 134 (*)    All other components within normal limits  GLUCOSE, CAPILLARY - Abnormal; Notable for the following components:   Glucose-Capillary 144 (*)    All other components within normal limits  GLUCOSE, CAPILLARY - Abnormal; Notable for the following components:   Glucose-Capillary 107 (*)    All other components within normal limits  GLUCOSE, CAPILLARY - Abnormal; Notable for the following components:   Glucose-Capillary 132 (*)    All other components within normal limits  CBG MONITORING, ED - Abnormal; Notable for the following components:   Glucose-Capillary 146 (*)    All other components within normal limits  CBG MONITORING, ED - Abnormal; Notable for the following components:   Glucose-Capillary 101 (*)    All other components within normal limits  CBG MONITORING, ED - Abnormal; Notable for the following components:   Glucose-Capillary 188 (*)    All other components within normal limits  PROTIME-INR  APTT  DIFFERENTIAL  ETHANOL  AMMONIA  LACTIC ACID, PLASMA  LACTIC ACID, PLASMA  CK  MAGNESIUM  TROPONIN I (HIGH SENSITIVITY)  TROPONIN I (HIGH SENSITIVITY)     EKG  ED ECG REPORT I, Sharyn Creamer, the attending physician, personally viewed and interpreted this ECG.  Date: 02/10/2023 EKG Time: 2300 Rate: 65 Rhythm: normal sinus rhythm QRS Axis: normal Intervals: normal ST/T Wave abnormalities: normal Narrative Interpretation: no evidence of acute ischemia    RADIOLOGY   CT head interpreted by me as negative for acute gross intracranial hemorrhage.  Discussion with radiologist as part of the code stroke process  CT HEAD CODE STROKE WO CONTRAST  Result Date: 02/07/2023 CLINICAL DATA:  Code stroke.  Confusion, slurred speech EXAM: CT HEAD WITHOUT CONTRAST TECHNIQUE: Contiguous axial images were obtained from the base of the skull through the vertex without intravenous contrast. RADIATION DOSE  REDUCTION: This exam was performed according to the departmental dose-optimization program which includes automated exposure control, adjustment of the mA and/or kV according to patient size and/or use of iterative reconstruction technique. COMPARISON:  07/06/2022 FINDINGS: Brain: No evidence of acute infarction, hemorrhage, mass, mass effect, or midline shift. No hydrocephalus or extra-axial collection. Vascular: No hyperdense vessel. Skull: Negative for fracture or focal lesion. Sinuses/Orbits: No acute finding. Status post bilateral lens replacements. Other: The mastoid  air cells are well aerated. ASPECTS [facility] Stroke Program Early CT Score) - Ganglionic level infarction (caudate, lentiform nuclei, internal capsule, insula, M1-M3 cortex): 7 - Supraganglionic infarction (M4-M6 cortex): 3 Total score (0-10 with 10 being normal): 10 IMPRESSION: 1. No acute intracranial process. 2. ASPECTS is 10. Code stroke imaging results were communicated on 02/07/2023 at 9:14 pm to provider Makeisha Jentsch via telephone, who verbally acknowledged these results. Electronically Signed   By: Wiliam Ke M.D.   On: 02/07/2023 21:15     PROCEDURES:  Critical Care performed: Yes, see critical care procedure note(s)  CRITICAL CARE Performed by: Sharyn Creamer   Total critical care time: 25 minutes  Critical care time was exclusive of separately billable procedures and treating other patients.  Critical care was necessary to treat or prevent imminent or life-threatening deterioration.  Critical care was time spent personally by me on the following activities: development of treatment plan with patient and/or surrogate as well as nursing, discussions with consultants, evaluation of patient's response to treatment, examination of patient, obtaining history from patient or surrogate, ordering and performing treatments and interventions, ordering and review of laboratory studies, ordering and review of radiographic studies, pulse  oximetry and re-evaluation of patient's condition.   Procedures   MEDICATIONS ORDERED IN ED: Medications  sodium chloride flush (NS) 0.9 % injection 3 mL (3 mLs Intravenous Given 02/07/23 2138)  aspirin chewable tablet 324 mg (324 mg Oral Given 02/07/23 2137)   stroke: early stages of recovery book ( Does not apply Given 02/08/23 1418)     IMPRESSION / MDM / ASSESSMENT AND PLAN / ED COURSE  I reviewed the triage vital signs and the nursing notes.                              Differential diagnosis includes, but is not limited to, encephalopathy, metabolic causation, infection, stroke or central neurologic process, etc.  Differential diagnosis quite broad but based on my assessment I do not see clear evidence of focality to suggest stroke.  Patient was seen and evaluated by her neurologist as well as part of the code stroke process.  Discussed the case with our neurology telemetry specialist.  Patient will be admitted to the hospital for further care and workup.  At this juncture no clear evidence of acute toxidrome, central neurologic cause, ACS, dysrhythmia, obvious infection, etc.  Further workup as per recommendations of neurology and anticipated under the hospitalist service.  Patient is deemed stable for admission  Labs interpreted as drug screen positive for tricyclic.  Negative ethanol.  Urinalysis without clear evidence of infection.  CBC with mild anemia metabolic panel with creatinine 1.5 slightly elevated.  Patient's presentation is most consistent with acute complicated illness / injury requiring diagnostic workup.   The patient is on the cardiac monitor to evaluate for evidence of arrhythmia and/or significant heart rate changes.   Consulted with and patient accepted to hospital service by Dr. Renaldo Reel     FINAL CLINICAL IMPRESSION(S) / ED DIAGNOSES   Final diagnoses:  Delirium     Note:  This document was prepared using Dragon voice recognition software and may  include unintentional dictation errors.   Sharyn Creamer, MD 02/10/23 2139

## 2023-02-07 NOTE — ED Notes (Addendum)
2057 Code Stroke cart activated while in CT, BIB EMS, LKW 2000 confusion and slurred speech. Pt has h/o SAH, HTN, DM, CAD, CKD and is on ASA and plavix. Glucose 155 with EMS 2101 TS paged 2102 pt back in room from CT 2106 Dr [name] on camera  Laurann Montana, RN Telestroke nurse

## 2023-02-07 NOTE — H&P (Signed)
History and Physical    Patient: Patrick Turner WJX:[id number] DOB: [birth year] DOA: 02/07/2023 DOS: the patient was seen and examined on 02/08/2023 PCP: Lauro Regulus, MD  Patient coming from: Home Cardiologist: Dr. Adrian Blackwater. Nephrology: Dr. Cherylann Ratel Vascular surgery: Dr. Wyn Quaker   Chief Complaint:  Chief Complaint  Patient presents with   Code Stroke    HPI: Patrick Turner is a 77 y.o. male with medical history significant for diabetes mellitus type 2 heart disease hypertension, hyperlipidemia, CKD stage IIIb, depression with anxiety, mild dementia, history of subarachnoid hemorrhage presenting for altered mental status and on arrival code stroke was activated patient was seen by neurology.  Patient has a history of bilateral carotid stenosis, chronic mesenteric ischemia, aortic stenosis, diabetic retinopathy, frequent falls.  Patient's last known normal was around 2000. Patient presentation patient is lethargic slow to respond speech is clear nonfocal on exam except for his mental status.  Neurologist eval patient recommended admission for possible metabolic, infectious encephalopathy.  An MRI of the brain to rule out stroke. Patient was seen recently by his cardiologist on the 24th for 68-month follow-up.  Patient has a history of heart disease and per note was stable without any chest discomfort shortness of breath palpitations edema.  Patient at that time did report orthostatic dizziness per cardiology note.  In the emergency room code stroke activated and patient had a stat noncontrast head CT which was negative for any acute hemorrhage or infarct.  Per neurology note LVO not suspected. CMP shows potassium of 5.4 glucose 154 CKD stage IIIb with creatinine of 1.6 GFR 42 normal LFTs. Ammonia of 18. CBC shows normal white count of 6.8 hemoglobin of 12.7 and platelets of 244. Troponin of 5 CPK of 80.  Lactic acid of 1.2. CBC shows hemoglobin of 12.7 otherwise normal white count  platelet count. Patient received aspirin 325 in the emergency room. Per protocol teleneurology consulted with recommendations for MRI which is negative for any acute intracranial process and are infarct.  Review of Systems: unable to review all systems due to the inability of the patient to answer questions.  Past Medical History:  Diagnosis Date   Acute on chronic renal failure (HCC) 03/23/2019   Acute on chronic renal insufficiency 05/30/2016   Altered mental status    Anemia    Anxiety    Aortic atherosclerosis (HCC)    Arthritis    Coronary artery disease    CRD (chronic renal disease)    Depression    Diabetes mellitus without complication (HCC)    Encephalopathy acute    GERD (gastroesophageal reflux disease)    Headache    Hypercholesteremia    Hypertension    Neuropathy    Severe obesity (HCC)    Sleep apnea    Sleep terror    per patient this year per patient    Past Surgical History:  Procedure Laterality Date   APPENDECTOMY     CIRCUMCISION     COLONOSCOPY     COLONOSCOPY WITH PROPOFOL N/A 07/10/2018   Procedure: COLONOSCOPY WITH PROPOFOL;  Surgeon: Christena Deem, MD;  Location: [facility] ENDOSCOPY;  Service: Endoscopy;  Laterality: N/A;   COLONOSCOPY WITH PROPOFOL N/A 04/23/2019   Procedure: COLONOSCOPY WITH PROPOFOL;  Surgeon: Christena Deem, MD;  Location: [facility] END[facility]COPY;  Service: Endoscopy;  Laterality: N/A;   CORONARY STENT INTERVENTION N/A 10/03/2019   Procedure: CORONARY STENT INTERVENTION;  Surgeon: Alwyn Pea, MD;  Location: [facility] INVASIVE CV LAB;  Service: Cardiovascular;  Laterality: N/A;   ESOPHAGOGASTRODUODENOSCOPY     ESOPHAGOGASTRODUODENOSCOPY (EGD) WITH PROPOFOL N/A 04/23/2019   Procedure: ESOPHAGOGASTRODUODENOSCOPY (EGD) WITH PROPOFOL;  Surgeon: Christena Deem, MD;  Location: [facility] ENDOSCOPY;  Service: Endoscopy;  Laterality: N/A;   INTRAMEDULLARY (IM) NAIL INTERTROCHANTERIC Left 09/17/2021   Procedure: INTRAMEDULLARY (IM) NAIL  INTERTROCHANTRIC;  Surgeon: Joen Laura, MD;  Location: [facility] OR;  Service: Orthopedics;  Laterality: Left;   JOINT REPLACEMENT     KNEE ARTHROPLASTY Right 06/27/2016   Procedure: COMPUTER ASSISTED TOTAL KNEE ARTHROPLASTY;  Surgeon: Donato Heinz, MD;  Location: [facility] ORS;  Service: Orthopedics;  Laterality: Right;   KNEE ARTHROSCOPY Right    LEFT HEART CATH AND CORONARY ANGIOGRAPHY N/A 10/03/2019   Procedure: Left Heart Cath and possible Coronary intervention;  Surgeon: Laurier Nancy, MD;  Location: [facility] INVASIVE CV LAB;  Service: Cardiovascular;  Laterality: N/A;   TONSILLECTOMY     VISCERAL ANGIOGRAPHY N/A 05/23/2019   Procedure: VISCERAL ANGIOGRAPHY;  Surgeon: Annice Needy, MD;  Location: [facility] INVASIVE CV LAB;  Service: Cardiovascular;  Laterality: N/A;   VISCERAL ANGIOGRAPHY N/A 11/04/2019   Procedure: VISCERAL ANGIOGRAPHY;  Surgeon: Annice Needy, MD;  Location: [facility] INVASIVE CV LAB;  Service: Cardiovascular;  Laterality: N/A;   VISCERAL ANGIOGRAPHY N/A 12/14/2021   Procedure: VISCERAL ANGIOGRAPHY;  Surgeon: Annice Needy, MD;  Location: [facility] INVASIVE CV LAB;  Service: Cardiovascular;  Laterality: N/A;   Social History:  reports that he has never smoked. He has never used smokeless tobacco. He reports current alcohol use. He reports that he does not use drugs.  Allergies  Allergen Reactions   Penicillins Anaphylaxis    Has patient had a PCN reaction causing immediate rash, facial/tongue/throat swelling, SOB or lightheadedness with hypotension: Yes Has patient had a PCN reaction causing severe rash involving mucus membranes or skin necrosis: No Has patient had a PCN reaction that required hospitalization Yes Has patient had a PCN reaction occurring within the last 10 years: No If all of the above answers are "NO", then may proceed with Cephalosporin use.   Tavist [Clemastine] Swelling    Family History  Problem Relation Age of Onset   Diabetes Mother    Heart disease Mother     Cancer Father    COPD Father     Prior to Admission medications   Medication Sig Start Date End Date Taking? Authorizing Provider  alendronate (FOSAMAX) 70 MG tablet Take 70 mg by mouth once a week. Take with a full glass of water on an empty stomach.    [provider]  amLODipine (NORVASC) 10 MG tablet Take 1 tablet (10 mg total) by mouth daily. 11/06/21   Danford, Earl Lites, MD  ascorbic acid (VITAMIN C) 500 MG tablet Take 500 mg by mouth daily.    [provider]  aspirin 81 MG tablet Take 1 tablet (81 mg total) by mouth daily. 02/23/18   Houston Siren, MD  atorvastatin (LIPITOR) 80 MG tablet Take 80 mg by mouth daily.    [provider]  carvedilol (COREG) 6.25 MG tablet Take 6.25 mg by mouth 2 (two) times daily with a meal.    [provider]  clopidogrel (PLAVIX) 75 MG tablet Take 75 mg by mouth daily.    [provider]  donepezil (ARICEPT) 5 MG tablet Take 1 tablet (5 mg total) by mouth at bedtime. 09/22/21   Cora Collum, DO  empagliflozin (JARDIANCE) 10 MG TABS tablet Take 1 tablet (10 mg total)  by mouth daily. 02/03/23   Laurier Nancy, MD  escitalopram (LEXAPRO) 10 MG tablet Take 10 mg by mouth daily.    [provider]  gabapentin (NEURONTIN) 400 MG capsule Take 400 mg by mouth 2 (two) times daily.    [provider]  insulin detemir (LEVEMIR) 100 UNIT/ML injection Inject 15 Units into the skin at bedtime. 03/04/19   [provider]  iron polysaccharides (NIFEREX) 150 MG capsule Take 150 mg by mouth daily.    [provider]  metFORMIN (GLUCOPHAGE-XR) 500 MG 24 hr tablet Take 1,000 mg by mouth 2 (two) times daily. 06/06/22   [provider]  Omega-3 Fatty Acids (FISH OIL) 1000 MG CAPS Take 1,000 mg by mouth daily.    [provider]  OZEMPIC, 0.25 OR 0.5 MG/DOSE, 2 MG/3ML SOPN Inject into the skin. 11/11/22   [provider]  pantoprazole (PROTONIX) 40 MG tablet Take 40  mg by mouth daily.    [provider]  QUEtiapine (SEROQUEL) 100 MG tablet Take 100 mg by mouth at bedtime.    [provider]  ranolazine (RANEXA) 1000 MG SR tablet TAKE 1 TABLET TWICE DAILY 01/09/23   Laurier Nancy, MD  Semaglutide,0.25 or 0.5MG /DOS, 2 MG/1.5ML SOPN Inject 0.5 mg into the skin every Wednesday.    [provider]  valsartan (DIOVAN) 40 MG tablet Take 40 mg by mouth daily. 06/17/22   [provider]  vitamin B-12 (CYANOCOBALAMIN) 1000 MCG tablet Take 1,000 mcg by mouth daily.    [provider]    Physical Exam: Vitals:   02/07/23 2140 02/08/23 0000 02/08/23 0200 02/08/23 0206  BP:  (!) 164/74  (!) 133/96  Pulse:  65 69 90  Resp:  17 18 (!) 27  Temp: 98.7 F (37.1 C)     SpO2:  98% 97% 93%  Weight: 86 kg      Physical Exam Vitals reviewed.  Constitutional:      Appearance: Normal appearance. He is obese. He is not ill-appearing.  HENT:     Head: Normocephalic and atraumatic.     Right Ear: External ear normal.     Left Ear: External ear normal.  Eyes:     Extraocular Movements: Extraocular movements intact.     Pupils: Pupils are equal, round, and reactive to light.  Cardiovascular:     Rate and Rhythm: Normal rate and regular rhythm.     Pulses: Normal pulses.     Heart sounds: Normal heart sounds.  Pulmonary:     Effort: Pulmonary effort is normal.     Breath sounds: Normal breath sounds.  Abdominal:     General: Bowel sounds are normal.     Palpations: Abdomen is soft.  Musculoskeletal:     Right lower leg: Edema present.     Left lower leg: Edema present.  Skin:    General: Skin is warm.  Neurological:     General: No focal deficit present.     Mental Status: He is alert and oriented to person, place, and time.     Cranial Nerves: No cranial nerve deficit.     Motor: No weakness.     Coordination: Coordination normal.     Deep Tendon Reflexes: Reflexes normal.  Psychiatric:        Mood and Affect:  Mood normal.        Behavior: Behavior normal.     Labs on Admission: I have personally reviewed following labs and imaging studies  CBC: Recent Labs  Lab 02/07/23 2119  WBC 6.8  NEUTROABS 4.4  HGB 12.7*  HCT 39.4  MCV 98.0  PLT 244   Basic Metabolic Panel: Recent Labs  Lab 02/07/23 2119  NA 137  K 5.4*  CL 107  CO2 22  GLUCOSE 154*  BUN 32*  CREATININE 1.66*  CALCIUM 8.6*   GFR: Estimated Creatinine Clearance: 37.6 mL/min (A) (by C-G formula based on SCr of 1.66 mg/dL (H)). Liver Function Tests: Recent Labs  Lab 02/07/23 2119  AST 19  ALT 13  ALKPHOS 78  BILITOT 0.8  PROT 7.2  ALBUMIN 4.2   No results for input(s): "LIPASE", "AMYLASE" in the last 168 hours. Recent Labs  Lab 02/07/23 2122  AMMONIA 18   Coagulation Profile: Recent Labs  Lab 02/07/23 2119  INR 1.0   Cardiac Enzymes: Recent Labs  Lab 02/07/23 2301  CKTOTAL 80   CBG: Recent Labs  Lab 02/07/23 2105  GLUCAP 146*   Urine analysis:    Component Value Date/Time   COLORURINE STRAW (A) 12/04/2022 1947   APPEARANCEUR CLEAR (A) 12/04/2022 1947   APPEARANCEUR Clear 11/23/2012 0629   LABSPEC 1.008 12/04/2022 1947   LABSPEC 1.018 11/23/2012 0629   PHURINE 7.0 12/04/2022 1947   GLUCOSEU >=500 (A) 12/04/2022 1947   GLUCOSEU Negative 11/23/2012 0629   HGBUR NEGATIVE 12/04/2022 1947   BILIRUBINUR NEGATIVE 12/04/2022 1947   BILIRUBINUR Negative 11/23/2012 0629   KETONESUR NEGATIVE 12/04/2022 1947   PROTEINUR NEGATIVE 12/04/2022 1947   NITRITE NEGATIVE 12/04/2022 1947   LEUKOCYTESUR TRACE (A) 12/04/2022 1947   LEUKOCYTESUR Negative 11/23/2012 0629    Radiological Exams on Admission: MR BRAIN WO CONTRAST  Result Date: 02/08/2023 CLINICAL DATA:  Altered mental status, slurred speech and nystagmus EXAM: MRI HEAD WITHOUT CONTRAST TECHNIQUE: Multiplanar, multiecho pulse sequences of the brain and surrounding structures were obtained without intravenous contrast. COMPARISON:  02/07/2023  CT head, 11/04/2021 MRI head FINDINGS: Brain: No restricted diffusion to suggest acute or subacute infarct. No acute hemorrhage, mass, mass effect, or midline shift. No hydrocephalus or extra-axial collection. Scattered T2 hyperintense signal in the periventricular white matter, likely the sequela of mild chronic small vessel ischemic disease. Vascular: Normal arterial flow voids. Skull and upper cervical spine: Normal marrow signal. Sinuses/Orbits: Clear paranasal sinuses. No acute finding in the orbits. Status post bilateral lens replacements. Other: The mastoid air cells are well aerated. IMPRESSION: No acute intracranial process. No evidence of acute or subacute infarct. Electronically Signed   By: Wiliam Ke M.D.   On: 02/08/2023 01:55   CT HEAD CODE STROKE WO CONTRAST  Result Date: 02/07/2023 CLINICAL DATA:  Code stroke.  Confusion, slurred speech EXAM: CT HEAD WITHOUT CONTRAST TECHNIQUE: Contiguous axial images were obtained from the base of the skull through the vertex without intravenous contrast. RADIATION DOSE REDUCTION: This exam was performed according to the departmental dose-optimization program which includes automated exposure control, adjustment of the mA and/or kV according to patient size and/or use of iterative reconstruction technique. COMPARISON:  07/06/2022 FINDINGS: Brain: No evidence of acute infarction, hemorrhage, mass, mass effect, or midline shift. No hydrocephalus or extra-axial collection. Vascular: No hyperdense vessel. Skull: Negative for fracture or focal lesion. Sinuses/Orbits: No acute finding. Status post bilateral lens replacements. Other: The mastoid air cells are well aerated. ASPECTS [facility] Stroke Program Early CT Score) - Ganglionic level infarction (caudate, lentiform nuclei, internal capsule, insula, M1-M3 cortex): 7 - Supraganglionic infarction (M4-M6 cortex): 3 Total score (0-10 with 10 being normal): 10 IMPRESSION:  1. No acute intracranial process. 2. ASPECTS  is 10. Code stroke imaging results were communicated on 02/07/2023 at 9:14 pm to provider Quale via telephone, who verbally acknowledged these results. Electronically Signed   By: Wiliam Ke M.D.   On: 02/07/2023 21:15     Data Reviewed: Relevant notes from primary care and specialist visits, past discharge summaries as available in EHR, including Care Everywhere. Prior diagnostic testing as pertinent to current admission diagnoses Updated medications and problem lists for reconciliation ED course, including vitals, labs, imaging, treatment and response to treatment Triage notes, nursing and pharmacy notes and ED provider's notes Notable results as noted in HPI   Assessment and Plan: >> Somnolence: Patient presenting with sleepy lethargy behavior concerning for stroke patient also had difficulty with speech.  Code stroke activated CT head negative. Patient also had MRI of the head which was negative for any acute findings.  Patient had negative troponins.  Patient had no chest pain no shortness of breath no palpitations. No falls. Continuous pulse oximetry to monitor for any concerns for sleep apnea to be followed with a formal sleep study for OSA. Neuro check.  Decrease gabapentin to 200 mg twice daily instead of 400 mg twice daily. Aspiration and fall precautions. Physical therapy evaluation. Will continue patient on his aspirin Plavix regimen from home.   >>Hyperkalemia: Mild we will follow.  Hold patient's Diovan.   >> CKD stage IIIb: Creatinine 1.66 with a EGFR of 42. Renally dose needed medications and avoid contrast studies.  >> Diabetes mellitus type 2: Swallow evaluation. Carb consistent diet heart healthy diet if patient passes bedside swallow. Hold patient's Jardiance, Levemir.  >> Anemia: Anemia of chronic kidney disease. Will follow and trend. No reports of any bleeding or bruising.    DVT prophylaxis: heparin   Consults: Teleneurology  Advance Care  Planning:   Code Status: Full Code   Family Communication: None  Disposition Plan: Back to previous home environment  Severity of Illness: The appropriate patient status for this patient is OBSERVATION. Observation status is judged to be reasonable and necessary in order to provide the required intensity of service to ensure the patient's safety. The patient's presenting symptoms, physical exam findings, and initial radiographic and laboratory data in the context of their medical condition is felt to place them at decreased risk for further clinical deterioration. Furthermore, it is anticipated that the patient will be medically stable for discharge from the hospital within 2 midnights of admission.   Author: Gertha Calkin, MD 02/08/2023 2:23 AM  For on call review [email or web address].

## 2023-02-07 NOTE — Consult Note (Signed)
[facility] TeleNeurology Consult Services   Patient Name:   Patrick Turner, goodridge Date of Birth:   May 06, 1946 Identification Number:   MRN - [id number] Date of Service:   02/07/2023 21:01:12  Diagnosis:       G93.41 - Encephalopathy Metabolic  Impression:      The patient presents with altered mental status, and EMS noted possible slurred speech and nystagmus. On exam, he is drowsy and slower to respond, but speech is clear, and no ataxia and no focal neurologic deficit aside from mental status. He denied dizziness. Although stroke is not ruled out, presentation suggests this is more likely a metabolic encephalopathy. I recommend Get WORKUP for TOXIC/METABOLIC/INFECTIOUS causes and if no obvious cause, then MRI brain can be done to rule out stroke. LVO is not suspected.  Our recommendations are outlined below.  Recommendations:        Stroke/Telemetry Floor       Neuro Checks       Bedside Swallow Eval       DVT Prophylaxis       IV Fluids, Normal Saline       Head of Bed 30 Degrees       Euglycemia and Avoid Hyperthermia (PRN Acetaminophen)       continue asa and plavix       Get WORKUP for TOXIC/METABOLIC/INFECTIOUS causes       Consider MRI brain for further stroke workup if no obvious metabolic/infectious/toxic cause.  Sign Out:       Discussed with Emergency Department Provider    ------------------------------------------------------------------------------  Advanced Imaging: Advanced Imaging Deferred because:  Non-disabling symptoms as verified by the patient; no cortical signs so not consistent with LVO   Metrics: Last Known Well: 02/07/2023 20:00:00 [facility] Notification Time: 02/07/2023 21:01:12 Arrival Time: 02/07/2023 20:48:00 Stamp Time: 02/07/2023 21:01:12 Initial Response Time: 02/07/2023 21:06:28 Symptoms: confusion and slurred speech. Initial patient interaction: 02/07/2023 21:08:50 NIHSS Assessment Completed: 02/07/2023  21:10:19 Patient is not a candidate for Thrombolytic. Thrombolytic Medical Decision: 02/07/2023 21:16:14 Patient was not deemed candidate for Thrombolytic because of following reasons: Current or Previous ICH. No disabling symptoms.  CT head showed no acute hemorrhage or acute core infarct.  Primary Provider Notified of Diagnostic Impression and Management Plan on: 02/07/2023 21:24:15    ------------------------------------------------------------------------------  History of Present Illness: Patient is a 77 year old Male.  Patient was brought by EMS for symptoms of confusion and slurred speech. He has a h/o SAH a year ago and TIA, CAD, HTN, HLD, DM and CKD. He presents with confusion and slurred speech. 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 He is slow to respond but speech is clear. He is hard of hearing. He is oriented to the month and his age. He denies any dizziness or headache. He denies any numbness or weakness. He uses a cane but not all the time.   Past Medical History:      Hypertension      Diabetes Mellitus      Hyperlipidemia      Coronary Artery Disease [place] PMH:  CKD  chronic mesenteric ischemia  aortic stenosis  diabetic retinopathy  mild dementia  SAH  neuropathy  falls frequently  Medications:  No Anticoagulant use  Antiplatelet use: Yes asa and plavix Reviewed EMR for current medications  Allergies:  Reviewed  Social History: Alcohol Use: No  Family History:  There is no family history of premature cerebrovascular disease pertinent to this consultation  ROS :  14 Points Review of Systems was performed and was negative except mentioned in HPI.  Past Surgical History: There Is No Surgical History Contributory To Today's Visit    Examination: BP(169/81), Pulse(64), 1A: Level of Consciousness - Arouses to minor stimulation + 1 1B: Ask Month and Age - Both Questions Right + 0 1C: Blink Eyes & Squeeze Hands - Performs  Both Tasks + 0 2: Test Horizontal Extraocular Movements - Normal + 0 3: Test Visual Fields - No Visual Loss + 0 4: Test Facial Palsy (Use Grimace if Obtunded) - Normal symmetry + 0 5A: Test Left Arm Motor Drift - No Drift for 10 Seconds + 0 5B: Test Right Arm Motor Drift - No Drift for 10 Seconds + 0 6A: Test Left Leg Motor Drift - No Drift for 5 Seconds + 0 6B: Test Right Leg Motor Drift - No Drift for 5 Seconds + 0 7: Test Limb Ataxia (FNF/Heel-Shin) - No Ataxia + 0 8: Test Sensation - Normal; No sensory loss + 0 9: Test Language/Aphasia - Normal; No aphasia + 0 10: Test Dysarthria - Normal + 0 11: Test Extinction/Inattention - No abnormality + 0  NIHSS Score: 1   Pre-Morbid Modified Rankin Scale: 3 Points = Moderate disability; requiring some help, but able to walk without assistance  Spoke with : Dr [name]  Patient/Family was informed the Neurology Consult would occur via TeleHealth consult by way of interactive audio and video telecommunications and consented to receiving care in this manner.   Patient is being evaluated for possible acute neurologic impairment and high probability of imminent or life-threatening deterioration. I spent total of 30 minutes providing care to this patient, including time for face to face visit via telemedicine, review of medical records, imaging studies and discussion of findings with providers, the patient and/or family.   Dr [name]   [facility] For Inpatient follow-up with [facility] physician please call [facility] [phone]. This is not an outpatient service. Post hospital discharge, please contact hospital directly.  Please do not communicate with [facility] physicians via secure chat. If you have any questions, Please contact [facility]. Please call or reconsult our service if there are any clinical or diagnostic changes.

## 2023-02-08 ENCOUNTER — Other Ambulatory Visit: Payer: Self-pay

## 2023-02-08 ENCOUNTER — Inpatient Hospital Stay: Payer: Medicare HMO

## 2023-02-08 ENCOUNTER — Inpatient Hospital Stay
Admit: 2023-02-08 | Discharge: 2023-02-08 | Disposition: A | Discharge status: Home / Self Care | Payer: Medicare HMO | Attending: Internal Medicine | Admitting: Internal Medicine

## 2023-02-08 ENCOUNTER — Ambulatory Visit: Payer: Medicare HMO

## 2023-02-08 ENCOUNTER — Encounter: Payer: Self-pay | Admitting: Internal Medicine

## 2023-02-08 DIAGNOSIS — R569 Unspecified convulsions: Secondary | ICD-10-CM

## 2023-02-08 DIAGNOSIS — I2510 Atherosclerotic heart disease of native coronary artery without angina pectoris: Secondary | ICD-10-CM

## 2023-02-08 DIAGNOSIS — I2583 Coronary atherosclerosis due to lipid rich plaque: Secondary | ICD-10-CM

## 2023-02-08 DIAGNOSIS — E1142 Type 2 diabetes mellitus with diabetic polyneuropathy: Secondary | ICD-10-CM | POA: Diagnosis not present

## 2023-02-08 DIAGNOSIS — D631 Anemia in chronic kidney disease: Secondary | ICD-10-CM

## 2023-02-08 DIAGNOSIS — R4182 Altered mental status, unspecified: Secondary | ICD-10-CM | POA: Diagnosis not present

## 2023-02-08 DIAGNOSIS — Z794 Long term (current) use of insulin: Secondary | ICD-10-CM

## 2023-02-08 DIAGNOSIS — I6389 Other cerebral infarction: Secondary | ICD-10-CM | POA: Diagnosis not present

## 2023-02-08 DIAGNOSIS — R410 Disorientation, unspecified: Secondary | ICD-10-CM

## 2023-02-08 DIAGNOSIS — N1832 Chronic kidney disease, stage 3b: Secondary | ICD-10-CM

## 2023-02-08 DIAGNOSIS — E1122 Type 2 diabetes mellitus with diabetic chronic kidney disease: Secondary | ICD-10-CM

## 2023-02-08 LAB — COMPREHENSIVE METABOLIC PANEL
ALT: 13 U/L (ref 0–44)
AST: 18 U/L (ref 15–41)
Albumin: 3.8 g/dL (ref 3.5–5.0)
Alkaline Phosphatase: 80 U/L (ref 38–126)
Anion gap: 12 (ref 5–15)
BUN: 32 mg/dL — ABNORMAL HIGH (ref 8–23)
CO2: 21 mmol/L — ABNORMAL LOW (ref 22–32)
Calcium: 8.8 mg/dL — ABNORMAL LOW (ref 8.9–10.3)
Chloride: 107 mmol/L (ref 98–111)
Creatinine, Ser: 1.53 mg/dL — ABNORMAL HIGH (ref 0.61–1.24)
GFR, Estimated: 47 mL/min — ABNORMAL LOW (ref >60)
Glucose, Bld: 108 mg/dL — ABNORMAL HIGH (ref 70–99)
Potassium: 4.5 mmol/L (ref 3.5–5.1)
Sodium: 140 mmol/L (ref 135–145)
Total Bilirubin: 0.6 mg/dL (ref 0.3–1.2)
Total Protein: 6.7 g/dL (ref 6.5–8.1)

## 2023-02-08 LAB — CBC WITH DIFFERENTIAL/PLATELET
Abs Immature Granulocytes: 0.03 10*3/uL (ref 0.00–0.07)
Basophils Absolute: 0 10*3/uL (ref 0.0–0.1)
Basophils Relative: 0 %
Eosinophils Absolute: 0.3 10*3/uL (ref 0.0–0.5)
Eosinophils Relative: 5 %
HCT: 36 % — ABNORMAL LOW (ref 39.0–52.0)
Hemoglobin: 11.7 g/dL — ABNORMAL LOW (ref 13.0–17.0)
Immature Granulocytes: 1 %
Lymphocytes Relative: 26 %
Lymphs Abs: 1.5 10*3/uL (ref 0.7–4.0)
MCH: 31.6 pg (ref 26.0–34.0)
MCHC: 32.5 g/dL (ref 30.0–36.0)
MCV: 97.3 fL (ref 80.0–100.0)
Monocytes Absolute: 0.6 10*3/uL (ref 0.1–1.0)
Monocytes Relative: 10 %
Neutro Abs: 3.3 10*3/uL (ref 1.7–7.7)
Neutrophils Relative %: 58 %
Platelets: 204 10*3/uL (ref 150–400)
RBC: 3.7 MIL/uL — ABNORMAL LOW (ref 4.22–5.81)
RDW: 13.7 % (ref 11.5–15.5)
WBC: 5.7 10*3/uL (ref 4.0–10.5)
nRBC: 0 % (ref 0.0–0.2)

## 2023-02-08 LAB — CBG MONITORING, ED
Glucose-Capillary: 101 mg/dL — ABNORMAL HIGH (ref 70–99)
Glucose-Capillary: 188 mg/dL — ABNORMAL HIGH (ref 70–99)

## 2023-02-08 LAB — URINE DRUG SCREEN, QUALITATIVE (ARMC ONLY)
Amphetamines, Ur Screen: NOT DETECTED
Barbiturates, Ur Screen: NOT DETECTED
Benzodiazepine, Ur Scrn: NOT DETECTED
Cannabinoid 50 Ng, Ur ~~LOC~~: NOT DETECTED
Cocaine Metabolite,Ur ~~LOC~~: NOT DETECTED
MDMA (Ecstasy)Ur Screen: NOT DETECTED
Methadone Scn, Ur: NOT DETECTED
Opiate, Ur Screen: NOT DETECTED
Phencyclidine (PCP) Ur S: NOT DETECTED
Tricyclic, Ur Screen: POSITIVE — AB

## 2023-02-08 LAB — URINALYSIS, ROUTINE W REFLEX MICROSCOPIC
Bacteria, UA: NONE SEEN
Bilirubin Urine: NEGATIVE
Glucose, UA: >=500 mg/dL — AB
Hgb urine dipstick: NEGATIVE
Ketones, ur: 5 mg/dL — AB
Leukocytes,Ua: NEGATIVE
Nitrite: NEGATIVE
Protein, ur: NEGATIVE mg/dL
Specific Gravity, Urine: 1.014 (ref 1.005–1.030)
Squamous Epithelial / HPF: NONE SEEN /HPF (ref 0–5)
pH: 7 (ref 5.0–8.0)

## 2023-02-08 LAB — CK: Total CK: 80 U/L (ref 49–397)

## 2023-02-08 LAB — TROPONIN I (HIGH SENSITIVITY)
Troponin I (High Sensitivity): 5 ng/L (ref <18)
Troponin I (High Sensitivity): 5 ng/L (ref <18)

## 2023-02-08 LAB — GLUCOSE, CAPILLARY
Glucose-Capillary: 141 mg/dL — ABNORMAL HIGH (ref 70–99)
Glucose-Capillary: 149 mg/dL — ABNORMAL HIGH (ref 70–99)

## 2023-02-08 LAB — LACTIC ACID, PLASMA: Lactic Acid, Venous: 1.2 mmol/L (ref 0.5–1.9)

## 2023-02-08 LAB — LIPID PANEL
Cholesterol: 88 mg/dL (ref 0–200)
HDL: 40 mg/dL — ABNORMAL LOW (ref >40)
LDL Cholesterol: 28 mg/dL (ref 0–99)
Total CHOL/HDL Ratio: 2.2 RATIO
Triglycerides: 99 mg/dL (ref <150)
VLDL: 20 mg/dL (ref 0–40)

## 2023-02-08 LAB — MAGNESIUM: Magnesium: 2.1 mg/dL (ref 1.7–2.4)

## 2023-02-08 MED ORDER — RANOLAZINE ER 500 MG PO TB12
1000.0000 mg | ORAL_TABLET | Freq: Two times a day (BID) | ORAL | Status: DC
  Administered 2023-02-08 – 2023-02-09 (×3): 1000 mg via ORAL
  Filled 2023-02-08 (×3): qty 2

## 2023-02-08 MED ORDER — ATORVASTATIN CALCIUM 20 MG PO TABS
80.0000 mg | ORAL_TABLET | Freq: Every day | ORAL | Status: DC
  Administered 2023-02-08 – 2023-02-09 (×2): 80 mg via ORAL
  Filled 2023-02-08 (×2): qty 4

## 2023-02-08 MED ORDER — GABAPENTIN 100 MG PO CAPS
200.0000 mg | ORAL_CAPSULE | Freq: Two times a day (BID) | ORAL | Status: DC
  Administered 2023-02-08 – 2023-02-09 (×2): 200 mg via ORAL
  Filled 2023-02-08 (×2): qty 2

## 2023-02-08 MED ORDER — CLOPIDOGREL BISULFATE 75 MG PO TABS
75.0000 mg | ORAL_TABLET | Freq: Every day | ORAL | Status: DC
  Administered 2023-02-08 – 2023-02-09 (×2): 75 mg via ORAL
  Filled 2023-02-08 (×2): qty 1

## 2023-02-08 MED ORDER — AMLODIPINE BESYLATE 10 MG PO TABS
10.0000 mg | ORAL_TABLET | Freq: Every day | ORAL | Status: DC
  Administered 2023-02-09: 10 mg via ORAL
  Filled 2023-02-08: qty 1

## 2023-02-08 MED ORDER — ASPIRIN 81 MG PO TBEC
81.0000 mg | DELAYED_RELEASE_TABLET | Freq: Every day | ORAL | Status: DC
  Administered 2023-02-08 – 2023-02-09 (×2): 81 mg via ORAL
  Filled 2023-02-08 (×2): qty 1

## 2023-02-08 MED ORDER — POLYSACCHARIDE IRON COMPLEX 150 MG PO CAPS
150.0000 mg | ORAL_CAPSULE | Freq: Every day | ORAL | Status: DC
  Administered 2023-02-08 – 2023-02-09 (×2): 150 mg via ORAL
  Filled 2023-02-08 (×2): qty 1

## 2023-02-08 MED ORDER — PANTOPRAZOLE SODIUM 40 MG PO TBEC
40.0000 mg | DELAYED_RELEASE_TABLET | Freq: Every day | ORAL | Status: DC
  Administered 2023-02-08 – 2023-02-09 (×2): 40 mg via ORAL
  Filled 2023-02-08 (×2): qty 1

## 2023-02-08 MED ORDER — IRBESARTAN 75 MG PO TABS
37.5000 mg | ORAL_TABLET | Freq: Every day | ORAL | Status: DC
  Administered 2023-02-09: 37.5 mg via ORAL
  Filled 2023-02-08: qty 0.5

## 2023-02-08 MED ORDER — QUETIAPINE FUMARATE 100 MG PO TABS
100.0000 mg | ORAL_TABLET | Freq: Every day | ORAL | Status: DC
  Administered 2023-02-08: 100 mg via ORAL
  Filled 2023-02-08: qty 4
  Filled 2023-02-08: qty 1

## 2023-02-08 MED ORDER — CARVEDILOL 6.25 MG PO TABS
6.2500 mg | ORAL_TABLET | Freq: Two times a day (BID) | ORAL | Status: DC
  Administered 2023-02-08 – 2023-02-09 (×2): 6.25 mg via ORAL
  Filled 2023-02-08 (×2): qty 1

## 2023-02-08 MED ORDER — ESCITALOPRAM OXALATE 10 MG PO TABS
10.0000 mg | ORAL_TABLET | Freq: Every day | ORAL | Status: DC
  Administered 2023-02-09: 10 mg via ORAL
  Filled 2023-02-08 (×2): qty 1

## 2023-02-08 MED ORDER — DONEPEZIL HCL 5 MG PO TABS
5.0000 mg | ORAL_TABLET | Freq: Every day | ORAL | Status: DC
  Administered 2023-02-08: 5 mg via ORAL
  Filled 2023-02-08: qty 1

## 2023-02-08 MED ORDER — VITAMIN C 500 MG PO TABS
500.0000 mg | ORAL_TABLET | Freq: Every day | ORAL | Status: DC
  Administered 2023-02-08 – 2023-02-09 (×2): 500 mg via ORAL
  Filled 2023-02-08 (×2): qty 1

## 2023-02-08 NOTE — Progress Notes (Signed)
  Progress Note   Patient: Patrick Turner NWG:[id number] DOB: 29-Oct-1945 DOA: 02/07/2023     1 DOS: the patient was seen and examined on 02/08/2023   Brief hospital course: DJ MCCRANIE is a 77 y.o. male with medical history significant for diabetes mellitus type 2 heart disease hypertension, hyperlipidemia, CKD stage IIIb, depression with anxiety, mild dementia, history of subarachnoid hemorrhage presented as code stroke, seen by neurology advised metabolic work up. MRI negative for intracranial etiology.  Assessment and Plan: Change in mental status- Stroke ruled out. MRI brain unremarkable. Orthostatics negative. Echo pending. EEG non diagnostic. Possibly medication related on top of his mild dementia. He is on Seroquel, Lexapro, Aricept. Dizziness likely chronic due to BPPV. Continue neurochecks. Fall, aspiration precautions. Delirium precautions. Supportive care.  Hyperkalemia- improved. Will follow daily electrolytes.  CKD stage 3b- Stable. Avoid nephrotoxic drugs. Adjust renally adjusted medications.  Type 2 diabetes- Accucheks, sliding scale insulin.  Anemia of chronic disease- No active bleeding. Continue iron supplements.  Hypertension/ Hyperlipidemia, CAD- Continue aspirin, Plavix, statin. Home antihypertensives initiated.  Obesity with BMI 31.5- Diet, exercise and weight reduction advised.  DVT prophylaxis- Heparin.      Subjective:  Patient is seen and examined today. He does have dizziness with getting out of bed. Orthostatics negative per RN. Seems on and off confused.   Physical Exam: Vitals:   02/08/23 1230 02/08/23 1300 02/08/23 1402 02/08/23 1615  BP: (!) 173/73 (!) 172/67 (!) 174/74 (!) 158/59  Pulse: 72 62 92 65  Resp: 18 20 20 18   Temp:    98 F (36.7 C)  TempSrc:      SpO2: 96% 100% 97% 98%  Weight:      Height:       Heart- S1, S2 heard. No murmurs. Lungs clear to auscultation. Abdomen, soft, obese, bowel sounds good. Neuro  -AAO, on and off confused.  Data Reviewed:  CBC, BMP  Family Communication: Called wife over phone to discuss his care, she understands and agrees that lot of his symptoms are going on for a while.   Disposition: Status is: Inpatient Remains inpatient appropriate because: continued symptoms of dizziness, altered mentation.  Planned Discharge Destination: Home     Code Status: Full Code  Time spent: 44 minutes  Author: Marcelino Duster, MD 02/08/2023 5:23 PM  For on call review [email or web address].

## 2023-02-08 NOTE — Progress Notes (Signed)
OT Cancellation Note  Patient Details Name: Patrick Turner MRN: [id number] DOB: 08/21/1946   Cancelled Treatment:    Reason Eval/Treat Not Completed: Patient at procedure or test/ unavailable. Order received, chart received. Pt was with transport upon arrival. Will attempt evaluation at a later date.   Daryon Remmert 02/08/2023, 1:52 PM

## 2023-02-08 NOTE — Evaluation (Signed)
Physical Therapy Evaluation Patient Details Name: Patrick Turner MRN: [id number] DOB: 1946-06-13 Today's Date: 02/08/2023  History of Present Illness  Patrick Turner is a 76yoM who [facility] on [facility] with somnolence, AMS. Code stroke activated, workup negative for acute stroke. PMH: DM2, CAD, HTN, HLD, CKD3b, depression, GAD, subarachnoid. Of note: pt having similar recurrent episodes in OPPT here in 2022.  Clinical Impression  Pt in bed on entry, reports fully returned to baseline. Pt able to demonstrate his baseline level performance of bed mobility, transfers, and AMB c his typical SPC. Pt familiar to author from OPPT in 2022 wherein he was having similar repeated episodes of orthostatic hypotension and presyncopal activity intolerance. Pt reports this BP issue never being fully resolved, reports fairly consistent but intermittent, often able to overcome it through be patient and prudent. Pt not aware of any trial medications to address this, however per MD note here, appears pt's cardiologist was aware to some degree of orthostatic symptoms. Pt now at baseline performance, no services indicated at DC but will continue to follow while admitted.      Recommendations for follow up therapy are one component of a multi-disciplinary discharge planning process, led by the attending physician.  Recommendations may be updated based on patient status, additional functional criteria and insurance authorization.  Follow Up Recommendations       Assistance Recommended at Discharge None  Patient can return home with the following  A little help with walking and/or transfers;Assist for transportation;Help with stairs or ramp for entrance    Equipment Recommendations None recommended by PT  Recommendations for Other Services       Functional Status Assessment Patient has not had a recent decline in their functional status     Precautions / Restrictions Precautions Precautions:  Fall Restrictions Weight Bearing Restrictions: No      Mobility  Bed Mobility Overal bed mobility: Needs Assistance Bed Mobility: Supine to Sit     Supine to sit: Modified independent (Device/Increase time)     General bed mobility comments: baseline, labored effort    Transfers Overall transfer level: Modified independent Equipment used: None, Straight cane Transfers: Sit to/from Stand Sit to Stand: Modified independent (Device/Increase time)           General transfer comment: thrice, 1x from commoder, 2x from toilet    Ambulation/Gait   Gait Distance (Feet): 200 Feet Assistive device: Straight cane         General Gait Details: drifiting and heavy compensated trendelenburg, pt reports moving as he typically walks; no dizziness, no abnormality.  Stairs            Wheelchair Mobility    Modified Rankin (Stroke Patients Only)       Balance                                             Pertinent Vitals/Pain Pain Assessment Pain Assessment: No/denies pain    Home Living Family/patient expects to be discharged to:: Private residence Living Arrangements: Spouse/significant other Available Help at Discharge: Family (son lives nearby) Type of Home: House Home Access: Stairs to enter Entrance Stairs-Rails: Right Entrance Stairs-Number of Steps: 5 Alternate Level Stairs-Number of Steps: 13 Home Layout: Two level;Bed/bath upstairs Home Equipment: Agricultural consultant (2 wheels);Rollator (4 wheels)      Prior Function Prior Level of Function : Independent/Modified Independent  Hand Dominance        Extremity/Trunk Assessment                Communication      Cognition                                                General Comments      Exercises     Assessment/Plan    PT Assessment Patient needs continued PT services  PT Problem List Decreased strength;Decreased  range of motion;Decreased activity tolerance;Decreased balance;Decreased mobility;Decreased knowledge of precautions;Decreased safety awareness       PT Treatment Interventions DME instruction;Patient/family education;Functional mobility training;Therapeutic activities    PT Goals (Current goals can be found in the Care Plan section)  Acute Rehab PT Goals Patient Stated Goal: remain without symptoms so he can go home PT Goal Formulation: With patient Time For Goal Achievement: 02/22/23 Potential to Achieve Goals: Good    Frequency Min 2X/week     Co-evaluation               AM-PAC PT "6 Clicks" Mobility  Outcome Measure Help needed turning from your back to your side while in a flat bed without using bedrails?: None Help needed moving from lying on your back to sitting on the side of a flat bed without using bedrails?: None Help needed moving to and from a bed to a chair (including a wheelchair)?: None Help needed standing up from a chair using your arms (e.g., wheelchair or bedside chair)?: None Help needed to walk in hospital room?: A Little Help needed climbing 3-5 steps with a railing? : A Little 6 Click Score: 22    End of Session Equipment Utilized During Treatment: Gait belt Activity Tolerance: Patient tolerated treatment well;No increased pain Patient left: in bed;with call bell/phone within reach Nurse Communication: Mobility status PT Visit Diagnosis: Unsteadiness on feet (R26.81);Other abnormalities of gait and mobility (R26.89)    Time: 1610-9604 PT Time Calculation (min) (ACUTE ONLY): 27 min   Charges:   PT Evaluation $PT Eval Low Complexity: 1 Low         5:18 PM, 02/08/23 Rosamaria Lints, PT, DPT Physical Therapist - [facility]  [phone] (ASCOM)     [name] 02/08/2023, 5:13 PM

## 2023-02-08 NOTE — Procedures (Signed)
Patient Name: Patrick Turner  MRN: [id number]  Epilepsy Attending: Charlsie Quest  Referring Physician/Provider: Gertha Calkin, MD  Date: 02/08/2023 Duration: 26.32 mins  Patient history: 77yo M with ams getting eeg to evaluate for seizure  Level of alertness: Awake  AEDs during EEG study: GBP  Technical aspects: This EEG study was done with scalp electrodes positioned according to the 10-20 International system of electrode placement. Electrical activity was reviewed with band pass filter of 1-70Hz , sensitivity of 7 uV/mm, display speed of 4mm/sec with a 60Hz  notched filter applied as appropriate. EEG data were recorded continuously and digitally stored.  Video monitoring was available and reviewed as appropriate.  Description: The posterior dominant rhythm consists of 9 Hz activity of moderate voltage (25-35 uV) seen predominantly in posterior head regions, symmetric and reactive to eye opening and eye closing. Hyperventilation and photic stimulation were not performed.     IMPRESSION: This study is within normal limits. No seizures or epileptiform discharges were seen throughout the recording.  A normal interictal EEG does not exclude the diagnosis of epilepsy.   Dyon Rotert Annabelle Harman

## 2023-02-08 NOTE — Progress Notes (Signed)
Eeg done 

## 2023-02-08 NOTE — Progress Notes (Signed)
Nutrition Brief Note  RD consulted for assessment of nutritional requirements/ status.   Wt Readings from Last 15 Encounters:  02/07/23 86 kg  02/03/23 83.9 kg  12/04/22 82 kg  11/25/22 82.1 kg  11/04/22 84.8 kg  07/06/22 81.6 kg  12/14/21 81.6 kg  12/03/21 78.9 kg  11/06/21 79.1 kg  09/16/21 83.5 kg  05/28/21 86.2 kg  01/21/21 90.2 kg  11/27/20 90.2 kg  11/03/20 85.2 kg  10/23/20 81.2 kg   Pt with medical history significant for diabetes mellitus type 2 heart disease hypertension, hyperlipidemia, CKD stage IIIb, depression with anxiety, mild dementia, history of subarachnoid hemorrhage presenting for altered mental status and on arrival code stroke was activated patient was seen by neurology.  Patient has a history of bilateral carotid stenosis, chronic mesenteric ischemia, aortic stenosis, diabetic retinopathy, frequent falls   Pt admitted with somnolence.   5/29- s/p EEG- WDL  Spoke with pt at bedside, who was pleasant and in good spirits today. He is waiting to eat his lunch. Pt reports good appetite, usually consuming 2 meals per day. Pt shares that he has lost about 150# over the past year, which he attributes to lifestyle modifications. Pt has cut out sugary beverages and increased fruit and vegetable consumption. Per pt, "I was almost 300 pounds and I couldn't live like that, so I decided to make a change".   Pt shares that he has experienced multiple falls over the past year. He explains that one leg is longer than the other secondary to previous hip surgery and his balance has been impacted due to this. He also shares that he fell off his porch a few months ago, which caused the screws in the hip replacement to become bent.   Discussed importance of good meal intake to promote healing.   Reviewed wt hx; wt has been stable over the past year.   Nutrition-Focused physical exam completed. Findings are no fat depletion, no muscle depletion, and no edema.    Medications  reviewed and include 0.9% sodium chloride infusion @ 50 ml/hr.   Lab Results  Component Value Date   HGBA1C 7.6 (H) 09/17/2021   PTA DM medications are 15 units insulin detemir daily at bedtime, 1.5 ml semaglutide weekly, 1 mg jardiance daily, and 1000 mg metformin BID.   Labs reviewed: CBGS: 188 (inpatient orders for glycemic control are 0-9 units insulin aspart TID with meals).    Body mass index is 31.55 kg/m. Patient meets criteria for obesity, class I based on current BMI. Obesity is a complex, chronic medical condition that is optimally managed by a multidisciplinary care team. Weight loss is not an ideal goal for an acute inpatient hospitalization. However, if further work-up for obesity is warranted, consider outpatient referral to [facility].    Current diet order is heart healthy, carb modified (liberalized to carb modified), patient is consuming approximately n/a% of meals at this time. Labs and medications reviewed.   No nutrition interventions warranted at this time. If nutrition issues arise, please consult RD.   Levada Schilling, RD, LDN, CDCES Registered Dietitian II Certified Diabetes Care and Education Specialist Please refer to [facility] for RD and/or RD on-call/weekend/after hours pager

## 2023-02-08 NOTE — ED Notes (Signed)
Orthostatic BP  Lying  BP 166/65   HR 70  Sitting BP 148/98 HR 74  Standing  BP 150/70 HR 78

## 2023-02-08 NOTE — Progress Notes (Signed)
SLP Cancellation Note  Patient Details Name: Patrick Turner MRN: [id number] DOB: 1945/12/05   Cancelled treatment:       Reason Eval/Treat Not Completed: SLP screened, no needs identified, will sign off MRI completed 02/07/23: No acute intracranial process. No evidence of acute or subacute infarct. Per neurology consult, "presentation suggests this is more likely a metabolic encephalopathy." Pt seen for SLP screen, with pt demonstrating functional simple conversation, attention and orientation to self, place, and situation. Pt endorsed awareness for AMS upon presentation to hospital ("my head was swimming"), but feeling "much better" cognitively today. Given results of imaging and suspicion of encephalopathy- no further SLP services indicated at this time. RN made aware.    [place] Rumaldo Difatta Clapp  MS [place] SLP   [place] J Clapp 02/08/2023, 11:19 AM

## 2023-02-08 NOTE — Progress Notes (Signed)
*  PRELIMINARY RESULTS* Echocardiogram 2D Echocardiogram has been performed.  Cristela Blue 02/08/2023, 1:45 PM

## 2023-02-09 DIAGNOSIS — E1165 Type 2 diabetes mellitus with hyperglycemia: Secondary | ICD-10-CM

## 2023-02-09 DIAGNOSIS — G9341 Metabolic encephalopathy: Principal | ICD-10-CM

## 2023-02-09 LAB — GLUCOSE, CAPILLARY
Glucose-Capillary: 107 mg/dL — ABNORMAL HIGH (ref 70–99)
Glucose-Capillary: 132 mg/dL — ABNORMAL HIGH (ref 70–99)
Glucose-Capillary: 134 mg/dL — ABNORMAL HIGH (ref 70–99)
Glucose-Capillary: 144 mg/dL — ABNORMAL HIGH (ref 70–99)

## 2023-02-09 LAB — HEMOGLOBIN A1C
Hgb A1c MFr Bld: 7.3 % — ABNORMAL HIGH (ref 4.8–5.6)
Mean Plasma Glucose: 163 mg/dL

## 2023-02-09 NOTE — TOC Initial Note (Signed)
Transition of Care [facility]) - Initial/Assessment Note    Patient Details  Name: Patrick Turner MRN: [id number] Date of Birth: 1946/06/05  [facility] of [facility]) CM/SW Contact:    Allena Katz, LCSW Phone Number: 02/09/2023, 10:01 AM  Clinical Narrative:  Pt admitted for syncope.  Pt lives at home with wife and is independent at baseline but does own a RW at home.PT/OT report no needs at home. Pt is active with Dr. Dareen Piano for primary care and uses [facility] on [address] in [place] for his pharmacy. SDOH needs reviewed none reported at this time. TOC following for any additional care needs.                       Patient Goals and CMS Choice            Expected Discharge Plan and Services                                              Prior Living Arrangements/Services                       Activities of Daily Living Home Assistive Devices/Equipment: Cane (specify quad or straight), Eyeglasses ADL Screening (condition at time of admission) Patient's cognitive ability adequate to safely complete daily activities?: Yes Is the patient deaf or have difficulty hearing?: No Does the patient have difficulty seeing, even when wearing glasses/contacts?: No Does the patient have difficulty concentrating, remembering, or making decisions?: Yes Patient able to express need for assistance with ADLs?: Yes Does the patient have difficulty dressing or bathing?: Yes Independently performs ADLs?: No Communication: Independent Dressing (OT): Needs assistance Is this a change from baseline?: Change from baseline, expected to last <3days Grooming: Independent Feeding: Independent Bathing: Needs assistance Is this a change from baseline?: Change from baseline, expected to last <3 days Toileting: Needs assistance Is this a change from baseline?: Change from baseline, expected to last <3 days In/Out Bed: Needs assistance Is this a change from baseline?:  Change from baseline, expected to last <3 days Walks in Home: Needs assistance Is this a change from baseline?: Change from baseline, expected to last <3 days Does the patient have difficulty walking or climbing stairs?: Yes Weakness of Legs: Both Weakness of Arms/Hands: None  Permission Sought/Granted                  Emotional Assessment              Admission diagnosis:  AMS (altered mental status) [R41.82] Patient Active Problem List   Diagnosis Date Noted   Nonrheumatic aortic valve stenosis 02/03/2023   Hypertensive urgency 11/04/2021   Slow rate of speech 11/04/2021   Type II diabetes mellitus with renal manifestations (HCC) 11/04/2021   Depression with anxiety 11/04/2021   CAD (coronary artery disease) 11/04/2021   SAH (subarachnoid hemorrhage) (HCC) 11/04/2021   Mild dementia (HCC)    Stage 3b chronic kidney disease (HCC)    Closed left hip fracture, initial encounter (HCC) 09/16/2021   Anemia in chronic kidney disease 05/28/2020   Benign hypertensive kidney disease with chronic kidney disease 05/28/2020   Proteinuria 05/28/2020   Falls frequently 04/27/2020   Use of cane as ambulatory aid 04/27/2020   Numbness and tingling of both legs 02/18/2020   Primary osteoarthritis of left knee 01/04/2020  Diabetic retinopathy (HCC) 01/01/2020   ASCVD (arteriosclerotic cardiovascular disease) 10/16/2019   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 without macular edema, with long-term current use of insulin (HCC) 10/16/2019   Hyperkalemia 10/14/2019   Pseudoaneurysm of right femoral artery (HCC) 10/14/2019   Unstable angina (HCC) 10/02/2019   Chest pain 10/01/2019   Uncontrolled type 2 diabetes mellitus with hyperglycemia, with long-term current use of insulin (HCC) 09/27/2019   Type 2 diabetes mellitus with vascular disease (HCC) 09/27/2019   Chronic mesenteric ischemia (HCC) 05/06/2019   Abdominal pain 05/03/2019    AMS (altered mental status) 12/24/2018   Dizziness 06/30/2018   HLD (hyperlipidemia) 02/23/2018   Aortic atherosclerosis (HCC) 11/02/2017   Healthcare maintenance 12/26/2016   DM type 2 with diabetic peripheral neuropathy (HCC) 11/09/2016   Presence of right artificial knee joint 08/14/2016   Bilateral carotid artery disease (HCC) 07/12/2016   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   Encephalopathy acute 05/30/2016   Anemia 05/30/2016   Diarrhea 05/30/2016   Chronic kidney disease (CKD) stage G3b/A2, moderately decreased glomerular filtration rate (GFR) between 30-44 mL/min/1.73 square meter and albuminuria creatinine ratio between 30-299 mg/g (HCC) 03/30/2015   Depression, major, in remission (HCC) 08/02/2014   GERD (gastroesophageal reflux disease) 03/08/2014   Hyperlipidemia associated with type 2 diabetes mellitus (HCC) 03/08/2014   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 [place] [address] Phone: [phone] Fax: [phone]     Social Determinants of Health (SDOH) Social History: SDOH Screenings   Food Insecurity: No Food Insecurity (02/08/2023)  Housing: Low Risk  (02/08/2023)  Transportation Needs: No Transportation Needs (02/08/2023)  Utilities: Not At Risk (02/08/2023)  Depression (PHQ2-9): Low Risk  (01/13/2022)  Financial Resource Strain: Low Risk  (05/23/2019)  Physical Activity: Unknown (05/23/2019)  Social Connections: Unknown (05/23/2019)  Stress: No Stress Concern Present (05/23/2019)  Tobacco Use: Low Risk  (02/08/2023)   SDOH Interventions:     Readmission Risk Interventions    02/09/2023   10:01 AM  Readmission Risk Prevention Plan  Transportation Screening Complete   PCP or Specialist Appt within 3-5 Days Complete  HRI or Home Care Consult Complete  Social Work Consult for Recovery Care Planning/Counseling Complete  Palliative Care Screening Not Applicable  Medication Review Oceanographer) Not Complete

## 2023-02-09 NOTE — TOC Transition Note (Signed)
Transition of Care [facility]) - CM/SW Discharge Note   Patient Details  Name: Patrick Turner MRN: [id number] Date of Birth: Jul 07, 1946  [facility]) CM/SW Contact:  Allena Katz, LCSW Phone Number: 02/09/2023, 1:25 PM   Clinical Narrative:    Pt has orders to discharge home. No TOC needs.          Patient Goals and CMS Choice      Discharge Placement                         Discharge Plan and Services Additional resources added to the After Visit Summary for                                       Social Determinants of Health (SDOH) Interventions SDOH Screenings   Food Insecurity: No Food Insecurity (02/08/2023)  Housing: Low Risk  (02/08/2023)  Transportation Needs: No Transportation Needs (02/08/2023)  Utilities: Not At Risk (02/08/2023)  Depression (PHQ2-9): Low Risk  (01/13/2022)  Financial Resource Strain: Low Risk  (05/23/2019)  Physical Activity: Unknown (05/23/2019)  Social Connections: Unknown (05/23/2019)  Stress: No Stress Concern Present (05/23/2019)  Tobacco Use: Low Risk  (02/08/2023)     Readmission Risk Interventions    02/09/2023   10:01 AM  Readmission Risk Prevention Plan  Transportation Screening Complete  PCP or Specialist Appt within 3-5 Days Complete  HRI or Home Care Consult Complete  Social Work Consult for Recovery Care Planning/Counseling Complete  Palliative Care Screening Not Applicable  Medication Review Oceanographer) Not Complete

## 2023-02-09 NOTE — Progress Notes (Signed)
OT Cancellation Note  Patient Details Name: Patrick Turner MRN: [id number] DOB: [birth year]   Cancelled Treatment:    Reason Eval/Treat Not Completed: OT screened, no needs identified, will sign off.Order received, chart reviewed. Pt back to baseline functional independence. No skilled OT needs identified. Will sign off. Please re-consult if additional needs arise.    Thresa Ross, OTS

## 2023-02-09 NOTE — Progress Notes (Signed)
Mobility Specialist - Progress Note   02/09/23 0834  Mobility  Activity Ambulated with assistance in room;Ambulated with assistance to bathroom;Dangled on edge of bed;Stood at bedside  Level of Assistance Standby assist, set-up cues, supervision of patient - no hands on  Assistive Device None  Distance Ambulated (ft) 10 ft  Activity Response Tolerated well  Mobility Referral Yes  $Mobility charge 1 Mobility  Mobility Specialist Start Time (ACUTE ONLY) [id number]  Mobility Specialist Stop Time (ACUTE ONLY) 0818  Mobility Specialist Time Calculation (min) (ACUTE ONLY) 12 min   Pt in fowlers position on RA upon arrival. Pt completes bed mobility indep. Pt STS and ambulates to/from bathroom SBA with no LOB noted. Pt returns to bed with needs in reach and bed alarm activated.   Terrilyn Saver  Mobility Specialist  02/09/23 8:37 AM

## 2023-02-09 NOTE — Discharge Summary (Addendum)
Physician Discharge Summary   Patient: Patrick Turner MRN: [id number] DOB: 1946/06/09  Admit date:     02/07/2023  Discharge date: 02/09/23  Discharge Physician: Marcelino Duster   PCP: Lauro Regulus, MD   Recommendations at discharge:   ENT follow up for dizziness. Held metformin due to kidney dysfunction.  Discharge Diagnoses: Principal Problem:   AMS (altered mental status) Active Problems:   Uncontrolled type 2 diabetes mellitus with hyperglycemia, with long-term current use of insulin (HCC)   Anemia in chronic kidney disease   Chronic kidney disease (CKD) stage G3b/A2, moderately decreased glomerular filtration rate (GFR) between 30-44 mL/min/1.73 square meter and albuminuria creatinine ratio between 30-299 mg/g (HCC)   Type II diabetes mellitus with renal manifestations (HCC)   CAD (coronary artery disease)  Resolved Problems:   * No resolved hospital problems. *  Hospital Course: HAVOC GOULETTE is a 77 y.o. male with medical history significant for diabetes mellitus type 2 heart disease hypertension, hyperlipidemia, CKD stage IIIb, depression with anxiety, mild dementia, history of subarachnoid hemorrhage presented as code stroke, seen by neurology advised metabolic work up. MRI negative for intracranial etiology. He was dizzy more with position change. On and off confused during hospital stay. Orthostatic negative. EEG unremarkable. Echo done pending report. I discussed with his wife over phone, according to her he does have confusion at times, chronic dizziness possibly due to BPPV. I advised to decrease pain meds, muscle relaxants, sedatives. Metformin held due to kidney dysfunction. He is hemodynamically stable to be discharged home. Advised PCP follow up, ENT eval upon discharge. He would like work note, understands and agrees with discharge plan.      Consultants: none Procedures performed: none  Disposition: Home Diet recommendation:  Discharge Diet  Orders (From admission, onward)     Start     Ordered   02/09/23 0000  Diet - low sodium heart healthy        02/09/23 1317           Cardiac and Carb modified diet DISCHARGE MEDICATION: Allergies as of 02/09/2023       Reactions   Penicillins Anaphylaxis   Has patient had a PCN reaction causing immediate rash, facial/tongue/throat swelling, SOB or lightheadedness with hypotension: Yes Has patient had a PCN reaction causing severe rash involving mucus membranes or skin necrosis: No Has patient had a PCN reaction that required hospitalization Yes Has patient had a PCN reaction occurring within the last 10 years: No If all of the above answers are "NO", then may proceed with Cephalosporin use.   Tavist [clemastine] Swelling        Medication List     STOP taking these medications    metFORMIN 500 MG 24 hr tablet Commonly known as: GLUCOPHAGE-XR       TAKE these medications    alendronate 70 MG tablet Commonly known as: FOSAMAX Take 70 mg by mouth once a week. Take with a full glass of water on an empty stomach.   amLODipine 10 MG tablet Commonly known as: NORVASC Take 1 tablet (10 mg total) by mouth daily.   ascorbic acid 500 MG tablet Commonly known as: VITAMIN C Take 500 mg by mouth daily.   aspirin 81 MG tablet Take 1 tablet (81 mg total) by mouth daily.   atorvastatin 80 MG tablet Commonly known as: LIPITOR Take 80 mg by mouth daily.   carvedilol 6.25 MG tablet Commonly known as: COREG Take 6.25 mg by mouth 2 (two) times  daily with a meal.   clopidogrel 75 MG tablet Commonly known as: PLAVIX Take 75 mg by mouth daily.   cyanocobalamin 1000 MCG tablet Commonly known as: VITAMIN B12 Take 1,000 mcg by mouth daily.   donepezil 5 MG tablet Commonly known as: ARICEPT Take 1 tablet (5 mg total) by mouth at bedtime.   empagliflozin 10 MG Tabs tablet Commonly known as: JARDIANCE Take 1 tablet (10 mg total) by mouth daily.   escitalopram 10 MG  tablet Commonly known as: LEXAPRO Take 10 mg by mouth daily.   Fish Oil 1000 MG Caps Take 1,000 mg by mouth daily.   gabapentin 400 MG capsule Commonly known as: NEURONTIN Take 400 mg by mouth 2 (two) times daily.   insulin detemir 100 UNIT/ML injection Commonly known as: LEVEMIR Inject 15 Units into the skin at bedtime.   iron polysaccharides 150 MG capsule Commonly known as: NIFEREX Take 150 mg by mouth daily.   Ozempic (0.25 or 0.5 MG/DOSE) 2 MG/3ML Sopn Generic drug: Semaglutide(0.25 or 0.5MG /DOS) Inject 0.5 mg into the skin once a week.   pantoprazole 40 MG tablet Commonly known as: PROTONIX Take 40 mg by mouth daily.   QUEtiapine 100 MG tablet Commonly known as: SEROQUEL Take 100 mg by mouth at bedtime.   ranolazine 1000 MG SR tablet Commonly known as: RANEXA TAKE 1 TABLET TWICE DAILY   valsartan 40 MG tablet Commonly known as: DIOVAN Take 40 mg by mouth daily.        Discharge Exam: Filed Weights   02/07/23 2140  Weight: 86 kg   Heart- S1, S2 heard. No murmurs. Lungs clear to auscultation. Abdomen, soft, obese, bowel sounds good. Neuro -AAO, on and off confused.  Condition at discharge: stable  The results of significant diagnostics from this hospitalization (including imaging, microbiology, ancillary and laboratory) are listed below for reference.   Imaging Studies: EEG adult  Result Date: Feb 09, 2023 Charlsie Quest, MD     2023-02-09  1:33 PM Patient Name: Patrick Turner MRN: [id number] Epilepsy Attending: Charlsie Quest Referring Physician/Provider: Gertha Calkin, MD Date: 2023/02/09 Duration: 26.32 mins Patient history: 77yo M with ams getting eeg to evaluate for seizure Level of alertness: Awake AEDs during EEG study: GBP Technical aspects: This EEG study was done with scalp electrodes positioned according to the 10-20 International system of electrode placement. Electrical activity was reviewed with band pass filter of 1-70Hz , sensitivity of  7 uV/mm, display speed of 51mm/sec with a 60Hz  notched filter applied as appropriate. EEG data were recorded continuously and digitally stored.  Video monitoring was available and reviewed as appropriate. Description: The posterior dominant rhythm consists of 9 Hz activity of moderate voltage (25-35 uV) seen predominantly in posterior head regions, symmetric and reactive to eye opening and eye closing. Hyperventilation and photic stimulation were not performed.   IMPRESSION: This study is within normal limits. No seizures or epileptiform discharges were seen throughout the recording. A normal interictal EEG does not exclude the diagnosis of epilepsy. Charlsie Quest   US Carotid Bilateral (at [facility] and [facility] only)  Result Date: 02-09-2023 CLINICAL DATA:  Stroke EXAM: BILATERAL CAROTID DUPLEX ULTRASOUND TECHNIQUE: Wallace Cullens scale imaging, color Doppler and duplex ultrasound were performed of bilateral carotid and vertebral arteries in the neck. COMPARISON:  None Available. FINDINGS: Criteria: Quantification of carotid stenosis is based on velocity parameters that correlate the residual internal carotid diameter with NASCET-based stenosis levels, using the diameter of the distal internal carotid lumen as the denominator  for stenosis measurement. The following velocity measurements were obtained: RIGHT ICA: 73/7 cm/sec CCA: 103/7 cm/sec SYSTOLIC ICA/CCA RATIO:  0.9 ECA:  100 cm/sec LEFT ICA: 117/13 cm/sec CCA: 115/5 cm/sec SYSTOLIC ICA/CCA RATIO:  1.5 ECA:  84 cm/sec RIGHT CAROTID ARTERY: Mild focal atherosclerotic plaque in the proximal internal carotid artery. By peak systolic velocity criteria, the estimated stenosis is less than 50%. RIGHT VERTEBRAL ARTERY:  Patent with normal antegrade flow. LEFT CAROTID ARTERY: Mild focal heterogeneous atherosclerotic plaque in the proximal internal carotid artery. By peak systolic velocity criteria, the estimated stenosis is less than 50%. LEFT VERTEBRAL ARTERY:  Patent with normal  antegrade flow. IMPRESSION: 1. Mild (1-49%) stenosis proximal right internal carotid artery secondary to focal heterogeneous atherosclerotic plaque. 2. Mild (1-49%) stenosis proximal left internal carotid artery secondary to mild heterogeneous atherosclerotic plaque. 3. Vertebral arteries are patent with normal antegrade flow. Signed, Sterling Big, MD, RPVI [facility] Radiology Electronically Signed   By: Malachy Moan M.D.   On: 02/08/2023 06:30   MR BRAIN WO CONTRAST  Result Date: 02/08/2023 CLINICAL DATA:  Altered mental status, slurred speech and nystagmus EXAM: MRI HEAD WITHOUT CONTRAST TECHNIQUE: Multiplanar, multiecho pulse sequences of the brain and surrounding structures were obtained without intravenous contrast. COMPARISON:  02/07/2023 CT head, 11/04/2021 MRI head FINDINGS: Brain: No restricted diffusion to suggest acute or subacute infarct. No acute hemorrhage, mass, mass effect, or midline shift. No hydrocephalus or extra-axial collection. Scattered T2 hyperintense signal in the periventricular white matter, likely the sequela of mild chronic small vessel ischemic disease. Vascular: Normal arterial flow voids. Skull and upper cervical spine: Normal marrow signal. Sinuses/Orbits: Clear paranasal sinuses. No acute finding in the orbits. Status post bilateral lens replacements. Other: The mastoid air cells are well aerated. IMPRESSION: No acute intracranial process. No evidence of acute or subacute infarct. Electronically Signed   By: Wiliam Ke M.D.   On: 02/08/2023 01:55   CT HEAD CODE STROKE WO CONTRAST  Result Date: 02/07/2023 CLINICAL DATA:  Code stroke.  Confusion, slurred speech EXAM: CT HEAD WITHOUT CONTRAST TECHNIQUE: Contiguous axial images were obtained from the base of the skull through the vertex without intravenous contrast. RADIATION DOSE REDUCTION: This exam was performed according to the departmental dose-optimization  program which includes automated exposure control, adjustment of the mA and/or kV according to patient size and/or use of iterative reconstruction technique. COMPARISON:  07/06/2022 FINDINGS: Brain: No evidence of acute infarction, hemorrhage, mass, mass effect, or midline shift. No hydrocephalus or extra-axial collection. Vascular: No hyperdense vessel. Skull: Negative for fracture or focal lesion. Sinuses/Orbits: No acute finding. Status post bilateral lens replacements. Other: The mastoid air cells are well aerated. ASPECTS [facility] Stroke Program Early CT Score) - Ganglionic level infarction (caudate, lentiform nuclei, internal capsule, insula, M1-M3 cortex): 7 - Supraganglionic infarction (M4-M6 cortex): 3 Total score (0-10 with 10 being normal): 10 IMPRESSION: 1. No acute intracranial process. 2. ASPECTS is 10. Code stroke imaging results were communicated on 02/07/2023 at 9:14 pm to provider Quale via telephone, who verbally acknowledged these results. Electronically Signed   By: Wiliam Ke M.D.   On: 02/07/2023 21:15    Microbiology: Results for orders placed or performed during the hospital encounter of 11/04/21  Resp Panel by RT-PCR (Flu A&B, Covid) Nasopharyngeal Swab     Status: None   Collection Time: 11/04/21  1:44 PM   Specimen: Nasopharyngeal Swab; Nasopharyngeal(NP) swabs in vial transport medium  Result Value Ref Range Status  SARS Coronavirus 2 by RT PCR NEGATIVE NEGATIVE Final    Comment: (NOTE) SARS-CoV-2 target nucleic acids are NOT DETECTED.  The SARS-CoV-2 RNA is generally detectable in upper respiratory specimens during the acute phase of infection. The lowest concentration of SARS-CoV-2 viral copies this assay can detect is 138 copies/mL. A negative result does not preclude SARS-Cov-2 infection and should not be used as the sole basis for treatment or other patient management decisions. A negative result may occur with  improper specimen collection/handling, submission  of specimen other than nasopharyngeal swab, presence of viral mutation(s) within the areas targeted by this assay, and inadequate number of viral copies(<138 copies/mL). A negative result must be combined with clinical observations, patient history, and epidemiological information. The expected result is Negative.  Fact Sheet for Patients:  BloggerCourse.com  Fact Sheet for Healthcare Providers:  SeriousBroker.it  This test is no t yet approved or cleared by the [place] FDA and  has been authorized for detection and/or diagnosis of SARS-CoV-2 by FDA under an Emergency Use Authorization (EUA). This EUA will remain  in effect (meaning this test can be used) for the duration of the COVID-19 declaration under Section 564(b)(1) of the Act, 21 U.S.C.section 360bbb-3(b)(1), unless the authorization is terminated  or revoked sooner.       Influenza A by PCR NEGATIVE NEGATIVE Final   Influenza B by PCR NEGATIVE NEGATIVE Final    Comment: (NOTE) The Xpert Xpress SARS-CoV-2/FLU/RSV plus assay is intended as an aid in the diagnosis of influenza from Nasopharyngeal swab specimens and should not be used as a sole basis for treatment. Nasal washings and aspirates are unacceptable for Xpert Xpress SARS-CoV-2/FLU/RSV testing.  Fact Sheet for Patients: BloggerCourse.com  Fact Sheet for Healthcare Providers: SeriousBroker.it  This test is not yet approved or cleared by the [place] FDA and has been authorized for detection and/or diagnosis of SARS-CoV-2 by FDA under an Emergency Use Authorization (EUA). This EUA will remain in effect (meaning this test can be used) for the duration of the COVID-19 declaration under Section 564(b)(1) of the Act, 21 U.S.C. section 360bbb-3(b)(1), unless the authorization is terminated or revoked.  Performed at [facility], [address] [place], [place] [address]     Labs: CBC: Recent Labs  Lab 02/07/23 2119 02/08/23 0430  WBC 6.8 5.7  NEUTROABS 4.4 3.3  HGB 12.7* 11.7*  HCT 39.4 36.0*  MCV 98.0 97.3  PLT 244 204   Basic Metabolic Panel: Recent Labs  Lab 02/07/23 2119 02/08/23 0430  NA 137 140  K 5.4* 4.5  CL 107 107  CO2 22 21*  GLUCOSE 154* 108*  BUN 32* 32*  CREATININE 1.66* 1.53*  CALCIUM 8.6* 8.8*  MG  --  2.1   Liver Function Tests: Recent Labs  Lab 02/07/23 2119 02/08/23 0430  AST 19 18  ALT 13 13  ALKPHOS 78 80  BILITOT 0.8 0.6  PROT 7.2 6.7  ALBUMIN 4.2 3.8   CBG: Recent Labs  Lab 02/08/23 2033 02/09/23 0010 02/09/23 0433 02/09/23 0720 02/09/23 1121  GLUCAP 149* 134* 144* 107* 132*    Discharge time spent: greater than 30 minutes.  Signed: Marcelino Duster, MD [facility] 02/09/2023

## 2023-02-09 NOTE — Progress Notes (Signed)
Mobility Specialist - Progress Note   02/09/23 0931  Mobility  Activity Ambulated with assistance in hallway  Level of Assistance Contact guard assist, steadying assist  Assistive Device Cane  Distance Ambulated (ft) 180 ft  Activity Response Tolerated well  Mobility Referral Yes  $Mobility charge 1 Mobility  Mobility Specialist Start Time (ACUTE ONLY) 0840  Mobility Specialist Stop Time (ACUTE ONLY) 0901  Mobility Specialist Time Calculation (min) (ACUTE ONLY) 21 min   Pt supine in bed on RA upon arrival. Pt completes bed mobility indep. Pt STS and ambulates in hallway CGA-SBA. Pt has two slight LOB corrected without assistance. Pt returns to bed with needs in reach.   Terrilyn Saver  Mobility Specialist  02/09/23 9:33 AM

## 2023-02-12 LAB — ECHOCARDIOGRAM COMPLETE BUBBLE STUDY
AR max vel: 2.19 cm2
AV Area VTI: 2.09 cm2
AV Area mean vel: 1.71 cm2
AV Mean grad: 5.5 mmHg
AV Peak grad: 9.6 mmHg
Ao pk vel: 1.55 m/s
Area-P 1/2: 2.31 cm2
MV VTI: 2.47 cm2
S' Lateral: 3.1 cm

## 2023-02-13 DIAGNOSIS — E1142 Type 2 diabetes mellitus with diabetic polyneuropathy: Secondary | ICD-10-CM | POA: Diagnosis not present

## 2023-02-13 DIAGNOSIS — E11649 Type 2 diabetes mellitus with hypoglycemia without coma: Secondary | ICD-10-CM | POA: Diagnosis not present

## 2023-02-13 DIAGNOSIS — E559 Vitamin D deficiency, unspecified: Secondary | ICD-10-CM | POA: Diagnosis not present

## 2023-02-13 DIAGNOSIS — E1159 Type 2 diabetes mellitus with other circulatory complications: Secondary | ICD-10-CM | POA: Diagnosis not present

## 2023-02-13 DIAGNOSIS — I10 Essential (primary) hypertension: Secondary | ICD-10-CM | POA: Diagnosis not present

## 2023-02-13 DIAGNOSIS — M810 Age-related osteoporosis without current pathological fracture: Secondary | ICD-10-CM | POA: Diagnosis not present

## 2023-02-13 DIAGNOSIS — E113393 Type 2 diabetes mellitus with moderate nonproliferative diabetic retinopathy without macular edema, bilateral: Secondary | ICD-10-CM | POA: Diagnosis not present

## 2023-02-13 DIAGNOSIS — E1122 Type 2 diabetes mellitus with diabetic chronic kidney disease: Secondary | ICD-10-CM | POA: Diagnosis not present

## 2023-02-13 DIAGNOSIS — Z8781 Personal history of (healed) traumatic fracture: Secondary | ICD-10-CM | POA: Diagnosis not present

## 2023-02-14 DIAGNOSIS — F02A2 Dementia in other diseases classified elsewhere, mild, with psychotic disturbance: Secondary | ICD-10-CM | POA: Diagnosis not present

## 2023-02-14 DIAGNOSIS — F332 Major depressive disorder, recurrent severe without psychotic features: Secondary | ICD-10-CM | POA: Diagnosis not present

## 2023-02-14 DIAGNOSIS — F418 Other specified anxiety disorders: Secondary | ICD-10-CM | POA: Diagnosis not present

## 2023-02-22 DIAGNOSIS — E1122 Type 2 diabetes mellitus with diabetic chronic kidney disease: Secondary | ICD-10-CM | POA: Diagnosis not present

## 2023-02-22 DIAGNOSIS — D631 Anemia in chronic kidney disease: Secondary | ICD-10-CM | POA: Diagnosis not present

## 2023-02-22 DIAGNOSIS — I129 Hypertensive chronic kidney disease with stage 1 through stage 4 chronic kidney disease, or unspecified chronic kidney disease: Secondary | ICD-10-CM | POA: Diagnosis not present

## 2023-02-22 DIAGNOSIS — E875 Hyperkalemia: Secondary | ICD-10-CM | POA: Diagnosis not present

## 2023-02-22 DIAGNOSIS — N1831 Chronic kidney disease, stage 3a: Secondary | ICD-10-CM | POA: Diagnosis not present

## 2023-02-22 DIAGNOSIS — R809 Proteinuria, unspecified: Secondary | ICD-10-CM | POA: Diagnosis not present

## 2023-02-28 ENCOUNTER — Other Ambulatory Visit: Payer: Self-pay | Admitting: Cardiovascular Disease

## 2023-03-10 ENCOUNTER — Ambulatory Visit: Payer: Medicare HMO | Admitting: Cardiovascular Disease

## 2023-04-18 DIAGNOSIS — E1122 Type 2 diabetes mellitus with diabetic chronic kidney disease: Secondary | ICD-10-CM | POA: Diagnosis not present

## 2023-04-18 DIAGNOSIS — I25118 Atherosclerotic heart disease of native coronary artery with other forms of angina pectoris: Secondary | ICD-10-CM | POA: Diagnosis not present

## 2023-04-18 DIAGNOSIS — N183 Chronic kidney disease, stage 3 unspecified: Secondary | ICD-10-CM | POA: Diagnosis not present

## 2023-04-18 DIAGNOSIS — I779 Disorder of arteries and arterioles, unspecified: Secondary | ICD-10-CM | POA: Diagnosis not present

## 2023-04-25 DIAGNOSIS — E113393 Type 2 diabetes mellitus with moderate nonproliferative diabetic retinopathy without macular edema, bilateral: Secondary | ICD-10-CM | POA: Diagnosis not present

## 2023-04-25 DIAGNOSIS — F325 Major depressive disorder, single episode, in full remission: Secondary | ICD-10-CM | POA: Diagnosis not present

## 2023-04-25 DIAGNOSIS — B351 Tinea unguium: Secondary | ICD-10-CM | POA: Diagnosis not present

## 2023-04-25 DIAGNOSIS — I25118 Atherosclerotic heart disease of native coronary artery with other forms of angina pectoris: Secondary | ICD-10-CM | POA: Diagnosis not present

## 2023-04-25 DIAGNOSIS — E1142 Type 2 diabetes mellitus with diabetic polyneuropathy: Secondary | ICD-10-CM | POA: Diagnosis not present

## 2023-04-25 DIAGNOSIS — I129 Hypertensive chronic kidney disease with stage 1 through stage 4 chronic kidney disease, or unspecified chronic kidney disease: Secondary | ICD-10-CM | POA: Diagnosis not present

## 2023-04-25 DIAGNOSIS — M810 Age-related osteoporosis without current pathological fracture: Secondary | ICD-10-CM | POA: Diagnosis not present

## 2023-04-25 DIAGNOSIS — E559 Vitamin D deficiency, unspecified: Secondary | ICD-10-CM | POA: Diagnosis not present

## 2023-04-25 DIAGNOSIS — E1122 Type 2 diabetes mellitus with diabetic chronic kidney disease: Secondary | ICD-10-CM | POA: Diagnosis not present

## 2023-04-25 DIAGNOSIS — M2042 Other hammer toe(s) (acquired), left foot: Secondary | ICD-10-CM | POA: Diagnosis not present

## 2023-04-25 DIAGNOSIS — N183 Chronic kidney disease, stage 3 unspecified: Secondary | ICD-10-CM | POA: Diagnosis not present

## 2023-04-25 DIAGNOSIS — I779 Disorder of arteries and arterioles, unspecified: Secondary | ICD-10-CM | POA: Diagnosis not present

## 2023-04-25 DIAGNOSIS — F03A3 Unspecified dementia, mild, with mood disturbance: Secondary | ICD-10-CM | POA: Diagnosis not present

## 2023-04-25 DIAGNOSIS — M79675 Pain in left toe(s): Secondary | ICD-10-CM | POA: Diagnosis not present

## 2023-04-25 DIAGNOSIS — M79674 Pain in right toe(s): Secondary | ICD-10-CM | POA: Diagnosis not present

## 2023-04-25 DIAGNOSIS — E11649 Type 2 diabetes mellitus with hypoglycemia without coma: Secondary | ICD-10-CM | POA: Diagnosis not present

## 2023-04-25 DIAGNOSIS — Z794 Long term (current) use of insulin: Secondary | ICD-10-CM | POA: Diagnosis not present

## 2023-04-25 DIAGNOSIS — E1159 Type 2 diabetes mellitus with other circulatory complications: Secondary | ICD-10-CM | POA: Diagnosis not present

## 2023-04-25 DIAGNOSIS — I10 Essential (primary) hypertension: Secondary | ICD-10-CM | POA: Diagnosis not present

## 2023-04-25 DIAGNOSIS — Z8781 Personal history of (healed) traumatic fracture: Secondary | ICD-10-CM | POA: Diagnosis not present

## 2023-05-04 ENCOUNTER — Ambulatory Visit: Payer: Medicare HMO | Admitting: Cardiovascular Disease

## 2023-05-04 ENCOUNTER — Encounter: Payer: Self-pay | Admitting: Cardiovascular Disease

## 2023-05-04 VITALS — BP 132/62 | HR 64 | Ht 63.0 in | Wt 179.0 lb

## 2023-05-04 DIAGNOSIS — R55 Syncope and collapse: Secondary | ICD-10-CM | POA: Diagnosis not present

## 2023-05-04 DIAGNOSIS — G4733 Obstructive sleep apnea (adult) (pediatric): Secondary | ICD-10-CM | POA: Diagnosis not present

## 2023-05-04 DIAGNOSIS — I700 Atherosclerosis of aorta: Secondary | ICD-10-CM

## 2023-05-04 DIAGNOSIS — I2583 Coronary atherosclerosis due to lipid rich plaque: Secondary | ICD-10-CM | POA: Diagnosis not present

## 2023-05-04 DIAGNOSIS — I10 Essential (primary) hypertension: Secondary | ICD-10-CM

## 2023-05-04 DIAGNOSIS — I2510 Atherosclerotic heart disease of native coronary artery without angina pectoris: Secondary | ICD-10-CM | POA: Diagnosis not present

## 2023-05-04 DIAGNOSIS — I350 Nonrheumatic aortic (valve) stenosis: Secondary | ICD-10-CM | POA: Diagnosis not present

## 2023-05-04 NOTE — Progress Notes (Signed)
Cardiology Office Note   Date:  05/04/2023   ID:  IAIN ZAKOWSKI, DOB 1946-06-05, MRN [id number]  PCP:  Lauro Regulus, MD  Cardiologist:  Adrian Blackwater, MD      History of Present Illness: Patrick Turner is a 77 y.o. male who presents for  Chief Complaint  Patient presents with   Follow-up    Had few falls      Past Medical History:  Diagnosis Date   Acute on chronic renal failure (HCC) 03/23/2019   Acute on chronic renal insufficiency 05/30/2016   Altered mental status    Anemia    Anxiety    Aortic atherosclerosis (HCC)    Arthritis    Coronary artery disease    CRD (chronic renal disease)    Depression    Diabetes mellitus without complication (HCC)    Encephalopathy acute    GERD (gastroesophageal reflux disease)    Headache    Hypercholesteremia    Hypertension    Neuropathy    Severe obesity (HCC)    Sleep apnea    Sleep terror    per patient this year per patient      Past Surgical History:  Procedure Laterality Date   APPENDECTOMY     CIRCUMCISION     COLONOSCOPY     COLONOSCOPY WITH PROPOFOL N/A 07/10/2018   Procedure: COLONOSCOPY WITH PROPOFOL;  Surgeon: Christena Deem, MD;  Location: [facility] ENDOSCOPY;  Service: Endoscopy;  Laterality: N/A;   COLONOSCOPY WITH PROPOFOL N/A 04/23/2019   Procedure: COLONOSCOPY WITH PROPOFOL;  Surgeon: Christena Deem, MD;  Location: [facility] ENDOSCOPY;  Service: Endoscopy;  Laterality: N/A;   CORONARY STENT INTERVENTION N/A 10/03/2019   Procedure: CORONARY STENT INTERVENTION;  Surgeon: Alwyn Pea, MD;  Location: [facility] INVASIVE CV LAB;  Service: Cardiovascular;  Laterality: N/A;   ESOPHAGOGASTRODUODENOSCOPY     ESOPHAGOGASTRODUODENOSCOPY (EGD) WITH PROPOFOL N/A 04/23/2019   Procedure: ESOPHAGOGASTRODUODENOSCOPY (EGD) WITH PROPOFOL;  Surgeon: Christena Deem, MD;  Location: [facility] ENDOSCOPY;  Service: Endoscopy;  Laterality: N/A;   INTRAMEDULLARY (IM) NAIL INTERTROCHANTERIC Left 09/17/2021    Procedure: INTRAMEDULLARY (IM) NAIL INTERTROCHANTRIC;  Surgeon: Joen Laura, MD;  Location: [facility] OR;  Service: Orthopedics;  Laterality: Left;   JOINT REPLACEMENT     KNEE ARTHROPLASTY Right 06/27/2016   Procedure: COMPUTER ASSISTED TOTAL KNEE ARTHROPLASTY;  Surgeon: Donato Heinz, MD;  Location: [facility] ORS;  Service: Orthopedics;  Laterality: Right;   KNEE ARTHROSCOPY Right    LEFT HEART CATH AND CORONARY ANGIOGRAPHY N/A 10/03/2019   Procedure: Left Heart Cath and possible Coronary intervention;  Surgeon: Laurier Nancy, MD;  Location: [facility] INVASIVE CV LAB;  Service: Cardiovascular;  Laterality: N/A;   TONSILLECTOMY     VISCERAL ANGIOGRAPHY N/A 05/23/2019   Procedure: VISCERAL ANGIOGRAPHY;  Surgeon: Annice Needy, MD;  Location: [facility] INVASIVE CV LAB;  Service: Cardiovascular;  Laterality: N/A;   VISCERAL ANGIOGRAPHY N/A 11/04/2019   Procedure: VISCERAL ANGIOGRAPHY;  Surgeon: Annice Needy, MD;  Location: [facility] INVASIVE CV LAB;  Service: Cardiovascular;  Laterality: N/A;   VISCERAL ANGIOGRAPHY N/A 12/14/2021   Procedure: VISCERAL ANGIOGRAPHY;  Surgeon: Annice Needy, MD;  Location: [facility] INVASIVE CV LAB;  Service: Cardiovascular;  Laterality: N/A;     Current Outpatient Medications  Medication Sig Dispense Refill   alendronate (FOSAMAX) 70 MG tablet Take 70 mg by mouth once a week. Take with a full glass of water on an empty stomach.     ascorbic acid (VITAMIN  C) 500 MG tablet Take 500 mg by mouth daily.     aspirin 81 MG tablet Take 1 tablet (81 mg total) by mouth daily.     atorvastatin (LIPITOR) 80 MG tablet Take 80 mg by mouth daily.     carvedilol (COREG) 6.25 MG tablet Take 6.25 mg by mouth 2 (two) times daily with a meal.     clopidogrel (PLAVIX) 75 MG tablet TAKE ONE TABLET BY MOUTH EVERY DAY 90 tablet 1   donepezil (ARICEPT) 5 MG tablet Take 1 tablet (5 mg total) by mouth at bedtime. 30 tablet 0   escitalopram (LEXAPRO) 10 MG tablet Take 10 mg by mouth daily.     gabapentin  (NEURONTIN) 400 MG capsule Take 400 mg by mouth 2 (two) times daily.     insulin detemir (LEVEMIR) 100 UNIT/ML injection Inject 15 Units into the skin at bedtime.     iron polysaccharides (NIFEREX) 150 MG capsule Take 150 mg by mouth daily.     Omega-3 Fatty Acids (FISH OIL) 1000 MG CAPS Take 1,000 mg by mouth daily.     OZEMPIC, 0.25 OR 0.5 MG/DOSE, 2 MG/3ML SOPN Inject 0.5 mg into the skin once a week.     pantoprazole (PROTONIX) 40 MG tablet Take 40 mg by mouth daily.     QUEtiapine (SEROQUEL) 100 MG tablet Take 100 mg by mouth at bedtime.     ranolazine (RANEXA) 1000 MG SR tablet TAKE 1 TABLET TWICE DAILY 180 tablet 1   valsartan (DIOVAN) 40 MG tablet Take 40 mg by mouth daily.     vitamin B-12 (CYANOCOBALAMIN) 1000 MCG tablet Take 1,000 mcg by mouth daily.     No current facility-administered medications for this visit.    Allergies:   Penicillins and Tavist [clemastine]    Social History:   reports that he has never smoked. He has never used smokeless tobacco. He reports current alcohol use. He reports that he does not use drugs.   Family History:  family history includes COPD in his father; Cancer in his father; Diabetes in his mother; Heart disease in his mother.    ROS:     Review of Systems  Constitutional: Negative.   HENT: Negative.    Eyes: Negative.   Respiratory: Negative.    Gastrointestinal: Negative.   Genitourinary: Negative.   Musculoskeletal: Negative.   Skin: Negative.   Neurological: Negative.   Endo/Heme/Allergies: Negative.   Psychiatric/Behavioral: Negative.    All other systems reviewed and are negative.     All other systems are reviewed and negative.    PHYSICAL EXAM: VS:  BP 132/62   Pulse 64   Ht 5\' 3"  (1.6 m)   Wt 179 lb (81.2 kg)   SpO2 98%   BMI 31.71 kg/m  , BMI Body mass index is 31.71 kg/m. Last weight:  Wt Readings from Last 3 Encounters:  05/04/23 179 lb (81.2 kg)  02/07/23 189 lb 9.5 oz (86 kg)  02/03/23 185 lb (83.9 kg)      Physical Exam Vitals reviewed.  Constitutional:      Appearance: Normal appearance. He is normal weight.  HENT:     Head: Normocephalic.     Nose: Nose normal.     Mouth/Throat:     Mouth: Mucous membranes are moist.  Eyes:     Pupils: Pupils are equal, round, and reactive to light.  Cardiovascular:     Rate and Rhythm: Normal rate and regular rhythm.     Pulses:  Normal pulses.     Heart sounds: Normal heart sounds.  Pulmonary:     Effort: Pulmonary effort is normal.  Abdominal:     General: Abdomen is flat. Bowel sounds are normal.  Musculoskeletal:        General: Normal range of motion.     Cervical back: Normal range of motion.  Skin:    General: Skin is warm.  Neurological:     General: No focal deficit present.     Mental Status: He is alert.  Psychiatric:        Mood and Affect: Mood normal.       EKG:   Recent Labs: 02/08/2023: ALT 13; BUN 32; Creatinine, Ser 1.53; Hemoglobin 11.7; Magnesium 2.1; Platelets 204; Potassium 4.5; Sodium 140    Lipid Panel    Component Value Date/Time   CHOL 88 02/08/2023 0430   TRIG 99 02/08/2023 0430   HDL 40 (L) 02/08/2023 0430   CHOLHDL 2.2 02/08/2023 0430   VLDL 20 02/08/2023 0430   LDLCALC 28 02/08/2023 0430      Other studies Reviewed: Additional studies/ records that were reviewed today include:  Review of the above records demonstrates:       No data to display            ASSESSMENT AND PLAN:    ICD-10-CM   1. Aortic atherosclerosis (HCC)  I70.0 MYOCARDIAL PERFUSION IMAGING    2. Coronary artery disease due to lipid rich plaque  I25.10 MYOCARDIAL PERFUSION IMAGING   I25.83     3. Nonrheumatic aortic valve stenosis  I35.0 MYOCARDIAL PERFUSION IMAGING    4. HTN (hypertension), benign  I10 MYOCARDIAL PERFUSION IMAGING   Off amlodapine    5. OSA (obstructive sleep apnea)  G47.33 MYOCARDIAL PERFUSION IMAGING    6. Syncope, unspecified syncope type  R55 MYOCARDIAL PERFUSION IMAGING   Patient  has history of having recently repeated fall in spite of medication adjustment for blood pressure.  Will rule out CAD by doing stress test       Problem List Items Addressed This Visit       Cardiovascular and Mediastinum   HTN (hypertension), benign   Relevant Orders   MYOCARDIAL PERFUSION IMAGING   Aortic atherosclerosis (HCC) - Primary   Relevant Orders   MYOCARDIAL PERFUSION IMAGING   CAD (coronary artery disease)   Relevant Orders   MYOCARDIAL PERFUSION IMAGING   Nonrheumatic aortic valve stenosis   Relevant Orders   MYOCARDIAL PERFUSION IMAGING   Other Visit Diagnoses     OSA (obstructive sleep apnea)       Relevant Orders   MYOCARDIAL PERFUSION IMAGING   Syncope, unspecified syncope type       Patient has history of having recently repeated fall in spite of medication adjustment for blood pressure.  Will rule out CAD by doing stress test   Relevant Orders   MYOCARDIAL PERFUSION IMAGING          Disposition:   Return in about 2 weeks (around 05/18/2023) for stress test and f/u.    Total time spent: 40 minutes  Signed,  Adrian Blackwater, MD  05/04/2023 11:29 AM    [facility]

## 2023-05-05 ENCOUNTER — Ambulatory Visit: Payer: Medicare HMO | Admitting: Cardiovascular Disease

## 2023-05-09 DIAGNOSIS — E1142 Type 2 diabetes mellitus with diabetic polyneuropathy: Secondary | ICD-10-CM | POA: Diagnosis not present

## 2023-05-09 DIAGNOSIS — F332 Major depressive disorder, recurrent severe without psychotic features: Secondary | ICD-10-CM | POA: Diagnosis not present

## 2023-05-09 DIAGNOSIS — F02A2 Dementia in other diseases classified elsewhere, mild, with psychotic disturbance: Secondary | ICD-10-CM | POA: Diagnosis not present

## 2023-05-09 DIAGNOSIS — F418 Other specified anxiety disorders: Secondary | ICD-10-CM | POA: Diagnosis not present

## 2023-05-12 ENCOUNTER — Ambulatory Visit (INDEPENDENT_AMBULATORY_CARE_PROVIDER_SITE_OTHER): Payer: Medicare HMO

## 2023-05-12 DIAGNOSIS — R55 Syncope and collapse: Secondary | ICD-10-CM | POA: Diagnosis not present

## 2023-05-12 DIAGNOSIS — I350 Nonrheumatic aortic (valve) stenosis: Secondary | ICD-10-CM | POA: Diagnosis not present

## 2023-05-12 DIAGNOSIS — I2510 Atherosclerotic heart disease of native coronary artery without angina pectoris: Secondary | ICD-10-CM | POA: Diagnosis not present

## 2023-05-12 DIAGNOSIS — G4733 Obstructive sleep apnea (adult) (pediatric): Secondary | ICD-10-CM

## 2023-05-12 DIAGNOSIS — I700 Atherosclerosis of aorta: Secondary | ICD-10-CM

## 2023-05-12 DIAGNOSIS — I10 Essential (primary) hypertension: Secondary | ICD-10-CM

## 2023-05-12 MED ORDER — TECHNETIUM TC 99M SESTAMIBI GENERIC - CARDIOLITE
32.7000 | Freq: Once | INTRAVENOUS | Status: AC | PRN
Start: 2023-05-12 — End: 2023-05-12
  Administered 2023-05-12: 32.7 via INTRAVENOUS

## 2023-05-12 MED ORDER — TECHNETIUM TC 99M SESTAMIBI GENERIC - CARDIOLITE
9.9000 | Freq: Once | INTRAVENOUS | Status: AC | PRN
Start: 2023-05-12 — End: 2023-05-12
  Administered 2023-05-12: 9.9 via INTRAVENOUS

## 2023-05-16 ENCOUNTER — Ambulatory Visit: Payer: Medicare HMO | Admitting: Cardiovascular Disease

## 2023-05-16 ENCOUNTER — Encounter: Payer: Self-pay | Admitting: Cardiovascular Disease

## 2023-05-16 VITALS — BP 121/57 | HR 60 | Ht 63.0 in | Wt 178.4 lb

## 2023-05-16 DIAGNOSIS — I350 Nonrheumatic aortic (valve) stenosis: Secondary | ICD-10-CM | POA: Diagnosis not present

## 2023-05-16 DIAGNOSIS — I6523 Occlusion and stenosis of bilateral carotid arteries: Secondary | ICD-10-CM

## 2023-05-16 DIAGNOSIS — I700 Atherosclerosis of aorta: Secondary | ICD-10-CM

## 2023-05-16 DIAGNOSIS — I2583 Coronary atherosclerosis due to lipid rich plaque: Secondary | ICD-10-CM | POA: Diagnosis not present

## 2023-05-16 DIAGNOSIS — I2510 Atherosclerotic heart disease of native coronary artery without angina pectoris: Secondary | ICD-10-CM

## 2023-05-16 DIAGNOSIS — I10 Essential (primary) hypertension: Secondary | ICD-10-CM

## 2023-05-16 NOTE — Progress Notes (Signed)
Cardiology Office Note   Date:  05/16/2023   ID:  [name], DOB 1946-04-02, MRN [id number]  PCP:  Lauro Regulus, MD  Cardiologist:  Adrian Blackwater, MD      History of Present Illness: Patrick Turner is a 77 y.o. male who presents for  Chief Complaint  Patient presents with   Follow-up    Has abdominal pain      Past Medical History:  Diagnosis Date   Acute on chronic renal failure (HCC) 03/23/2019   Acute on chronic renal insufficiency 05/30/2016   Altered mental status    Anemia    Anxiety    Aortic atherosclerosis (HCC)    Arthritis    Coronary artery disease    CRD (chronic renal disease)    Depression    Diabetes mellitus without complication (HCC)    Encephalopathy acute    GERD (gastroesophageal reflux disease)    Headache    Hypercholesteremia    Hypertension    Neuropathy    Severe obesity (HCC)    Sleep apnea    Sleep terror    per patient this year per patient      Past Surgical History:  Procedure Laterality Date   APPENDECTOMY     CIRCUMCISION     COLONOSCOPY     COLONOSCOPY WITH PROPOFOL N/A 07/10/2018   Procedure: COLONOSCOPY WITH PROPOFOL;  Surgeon: Christena Deem, MD;  Location: [facility] ENDOSCOPY;  Service: Endoscopy;  Laterality: N/A;   COLONOSCOPY WITH PROPOFOL N/A 04/23/2019   Procedure: COLONOSCOPY WITH PROPOFOL;  Surgeon: Christena Deem, MD;  Location: [facility] ENDOSCOPY;  Service: Endoscopy;  Laterality: N/A;   CORONARY STENT INTERVENTION N/A 10/03/2019   Procedure: CORONARY STENT INTERVENTION;  Surgeon: Alwyn Pea, MD;  Location: [facility] INVASIVE CV LAB;  Service: Cardiovascular;  Laterality: N/A;   ESOPHAGOGASTRODUODENOSCOPY     ESOPHAGOGASTRODUODENOSCOPY (EGD) WITH PROPOFOL N/A 04/23/2019   Procedure: ESOPHAGOGASTRODUODENOSCOPY (EGD) WITH PROPOFOL;  Surgeon: Christena Deem, MD;  Location: [facility] ENDOSCOPY;  Service: Endoscopy;  Laterality: N/A;   INTRAMEDULLARY (IM) NAIL INTERTROCHANTERIC Left 09/17/2021    Procedure: INTRAMEDULLARY (IM) NAIL INTERTROCHANTRIC;  Surgeon: Joen Laura, MD;  Location: [facility] OR;  Service: Orthopedics;  Laterality: Left;   JOINT REPLACEMENT     KNEE ARTHROPLASTY Right 06/27/2016   Procedure: COMPUTER ASSISTED TOTAL KNEE ARTHROPLASTY;  Surgeon: Donato Heinz, MD;  Location: [facility] ORS;  Service: Orthopedics;  Laterality: Right;   KNEE ARTHROSCOPY Right    LEFT HEART CATH AND CORONARY ANGIOGRAPHY N/A 10/03/2019   Procedure: Left Heart Cath and possible Coronary intervention;  Surgeon: Laurier Nancy, MD;  Location: [facility] INVASIVE CV LAB;  Service: Cardiovascular;  Laterality: N/A;   TONSILLECTOMY     VISCERAL ANGIOGRAPHY N/A 05/23/2019   Procedure: VISCERAL ANGIOGRAPHY;  Surgeon: Annice Needy, MD;  Location: [facility] INVASIVE CV LAB;  Service: Cardiovascular;  Laterality: N/A;   VISCERAL ANGIOGRAPHY N/A 11/04/2019   Procedure: VISCERAL ANGIOGRAPHY;  Surgeon: Annice Needy, MD;  Location: [facility] INVASIVE CV LAB;  Service: Cardiovascular;  Laterality: N/A;   VISCERAL ANGIOGRAPHY N/A 12/14/2021   Procedure: VISCERAL ANGIOGRAPHY;  Surgeon: Annice Needy, MD;  Location: [facility] INVASIVE CV LAB;  Service: Cardiovascular;  Laterality: N/A;     Current Outpatient Medications  Medication Sig Dispense Refill   alendronate (FOSAMAX) 70 MG tablet Take 70 mg by mouth once a week. Take with a full glass of water on an empty stomach.     ascorbic acid (VITAMIN  C) 500 MG tablet Take 500 mg by mouth daily.     aspirin 81 MG tablet Take 1 tablet (81 mg total) by mouth daily.     atorvastatin (LIPITOR) 80 MG tablet Take 80 mg by mouth daily.     carvedilol (COREG) 6.25 MG tablet Take 6.25 mg by mouth 2 (two) times daily with a meal.     clopidogrel (PLAVIX) 75 MG tablet TAKE ONE TABLET BY MOUTH EVERY DAY 90 tablet 1   donepezil (ARICEPT) 5 MG tablet Take 1 tablet (5 mg total) by mouth at bedtime. 30 tablet 0   escitalopram (LEXAPRO) 10 MG tablet Take 10 mg by mouth daily.     gabapentin  (NEURONTIN) 400 MG capsule Take 400 mg by mouth 2 (two) times daily.     insulin detemir (LEVEMIR) 100 UNIT/ML injection Inject 15 Units into the skin at bedtime.     iron polysaccharides (NIFEREX) 150 MG capsule Take 150 mg by mouth daily. (Patient not taking: Reported on 05/16/2023)     Omega-3 Fatty Acids (FISH OIL) 1000 MG CAPS Take 1,000 mg by mouth daily.     OZEMPIC, 0.25 OR 0.5 MG/DOSE, 2 MG/3ML SOPN Inject 0.5 mg into the skin once a week.     pantoprazole (PROTONIX) 40 MG tablet Take 40 mg by mouth daily.     QUEtiapine (SEROQUEL) 100 MG tablet Take 100 mg by mouth at bedtime.     ranolazine (RANEXA) 1000 MG SR tablet TAKE 1 TABLET TWICE DAILY 180 tablet 1   valsartan (DIOVAN) 40 MG tablet Take 40 mg by mouth daily.     vitamin B-12 (CYANOCOBALAMIN) 1000 MCG tablet Take 1,000 mcg by mouth daily.     No current facility-administered medications for this visit.    Allergies:   Penicillins and Tavist [clemastine]    Social History:   reports that he has never smoked. He has never used smokeless tobacco. He reports current alcohol use. He reports that he does not use drugs.   Family History:  family history includes COPD in his father; Cancer in his father; Diabetes in his mother; Heart disease in his mother.    ROS:     Review of Systems  Constitutional: Negative.   HENT: Negative.    Eyes: Negative.   Respiratory: Negative.    Gastrointestinal: Negative.   Genitourinary: Negative.   Musculoskeletal: Negative.   Skin: Negative.   Neurological: Negative.   Endo/Heme/Allergies: Negative.   Psychiatric/Behavioral: Negative.    All other systems reviewed and are negative.     All other systems are reviewed and negative.    PHYSICAL EXAM: VS:  BP (!) 121/57   Pulse 60   Ht 5\' 3"  (1.6 m)   Wt 178 lb 6.4 oz (80.9 kg)   SpO2 98%   BMI 31.60 kg/m  , BMI Body mass index is 31.6 kg/m. Last weight:  Wt Readings from Last 3 Encounters:  05/16/23 178 lb 6.4 oz (80.9 kg)   05/04/23 179 lb (81.2 kg)  02/07/23 189 lb 9.5 oz (86 kg)     Physical Exam    EKG:   Recent Labs: 02/08/2023: ALT 13; BUN 32; Creatinine, Ser 1.53; Hemoglobin 11.7; Magnesium 2.1; Platelets 204; Potassium 4.5; Sodium 140    Lipid Panel    Component Value Date/Time   CHOL 88 02/08/2023 0430   TRIG 99 02/08/2023 0430   HDL 40 (L) 02/08/2023 0430   CHOLHDL 2.2 02/08/2023 0430   VLDL 20 02/08/2023 0430  LDLCALC 28 02/08/2023 0430      Other studies Reviewed: Additional studies/ records that were reviewed today include:  Review of the above records demonstrates:       No data to display            ASSESSMENT AND PLAN:    ICD-10-CM   1. Aortic atherosclerosis (HCC)  I70.0     2. Bilateral carotid artery stenosis  I65.23     3. Coronary artery disease due to lipid rich plaque  I25.10    I25.83    stress test  normal, with normal LVEF    4. Nonrheumatic aortic valve stenosis  I35.0     5. HTN (hypertension), benign  I10    BP ,low, but no dizziness       Problem List Items Addressed This Visit       Cardiovascular and Mediastinum   Bilateral carotid artery disease (HCC)   HTN (hypertension), benign   Aortic atherosclerosis (HCC) - Primary   CAD (coronary artery disease)   Nonrheumatic aortic valve stenosis       Disposition:   Return in about 3 months (around 08/15/2023).    Total time spent: 30 minutes  Signed,  Adrian Blackwater, MD  05/16/2023 1:28 PM    [facility]

## 2023-05-30 ENCOUNTER — Ambulatory Visit (INDEPENDENT_AMBULATORY_CARE_PROVIDER_SITE_OTHER): Payer: Medicare HMO

## 2023-05-30 ENCOUNTER — Ambulatory Visit (INDEPENDENT_AMBULATORY_CARE_PROVIDER_SITE_OTHER): Payer: Medicare HMO | Admitting: Vascular Surgery

## 2023-05-30 DIAGNOSIS — K551 Chronic vascular disorders of intestine: Secondary | ICD-10-CM | POA: Diagnosis not present

## 2023-06-02 DIAGNOSIS — E113313 Type 2 diabetes mellitus with moderate nonproliferative diabetic retinopathy with macular edema, bilateral: Secondary | ICD-10-CM | POA: Diagnosis not present

## 2023-06-04 ENCOUNTER — Other Ambulatory Visit: Payer: Self-pay | Admitting: Cardiovascular Disease

## 2023-06-15 DIAGNOSIS — M545 Low back pain, unspecified: Secondary | ICD-10-CM | POA: Diagnosis not present

## 2023-06-15 DIAGNOSIS — S72142D Displaced intertrochanteric fracture of left femur, subsequent encounter for closed fracture with routine healing: Secondary | ICD-10-CM | POA: Diagnosis not present

## 2023-06-15 DIAGNOSIS — M898X5 Other specified disorders of bone, thigh: Secondary | ICD-10-CM | POA: Diagnosis not present

## 2023-06-29 DIAGNOSIS — Z96651 Presence of right artificial knee joint: Secondary | ICD-10-CM | POA: Diagnosis not present

## 2023-07-05 ENCOUNTER — Other Ambulatory Visit: Payer: Self-pay | Admitting: Cardiovascular Disease

## 2023-07-12 ENCOUNTER — Other Ambulatory Visit: Payer: Self-pay | Admitting: Cardiovascular Disease

## 2023-07-12 DIAGNOSIS — I2510 Atherosclerotic heart disease of native coronary artery without angina pectoris: Secondary | ICD-10-CM

## 2023-07-25 DIAGNOSIS — E11649 Type 2 diabetes mellitus with hypoglycemia without coma: Secondary | ICD-10-CM | POA: Diagnosis not present

## 2023-07-25 DIAGNOSIS — E559 Vitamin D deficiency, unspecified: Secondary | ICD-10-CM | POA: Diagnosis not present

## 2023-07-25 DIAGNOSIS — M810 Age-related osteoporosis without current pathological fracture: Secondary | ICD-10-CM | POA: Diagnosis not present

## 2023-08-01 DIAGNOSIS — F332 Major depressive disorder, recurrent severe without psychotic features: Secondary | ICD-10-CM | POA: Diagnosis not present

## 2023-08-01 DIAGNOSIS — F02A2 Dementia in other diseases classified elsewhere, mild, with psychotic disturbance: Secondary | ICD-10-CM | POA: Diagnosis not present

## 2023-08-01 DIAGNOSIS — F418 Other specified anxiety disorders: Secondary | ICD-10-CM | POA: Diagnosis not present

## 2023-08-07 DIAGNOSIS — M810 Age-related osteoporosis without current pathological fracture: Secondary | ICD-10-CM | POA: Diagnosis not present

## 2023-08-07 DIAGNOSIS — E11649 Type 2 diabetes mellitus with hypoglycemia without coma: Secondary | ICD-10-CM | POA: Diagnosis not present

## 2023-08-07 DIAGNOSIS — E1159 Type 2 diabetes mellitus with other circulatory complications: Secondary | ICD-10-CM | POA: Diagnosis not present

## 2023-08-07 DIAGNOSIS — Z794 Long term (current) use of insulin: Secondary | ICD-10-CM | POA: Diagnosis not present

## 2023-08-07 DIAGNOSIS — E113393 Type 2 diabetes mellitus with moderate nonproliferative diabetic retinopathy without macular edema, bilateral: Secondary | ICD-10-CM | POA: Diagnosis not present

## 2023-08-07 DIAGNOSIS — E1142 Type 2 diabetes mellitus with diabetic polyneuropathy: Secondary | ICD-10-CM | POA: Diagnosis not present

## 2023-08-07 DIAGNOSIS — E1122 Type 2 diabetes mellitus with diabetic chronic kidney disease: Secondary | ICD-10-CM | POA: Diagnosis not present

## 2023-08-07 DIAGNOSIS — N183 Chronic kidney disease, stage 3 unspecified: Secondary | ICD-10-CM | POA: Diagnosis not present

## 2023-08-07 DIAGNOSIS — I10 Essential (primary) hypertension: Secondary | ICD-10-CM | POA: Diagnosis not present

## 2023-08-15 ENCOUNTER — Encounter: Payer: Self-pay | Admitting: Cardiovascular Disease

## 2023-08-15 ENCOUNTER — Ambulatory Visit: Payer: Medicare HMO | Admitting: Cardiovascular Disease

## 2023-08-15 VITALS — BP 120/62 | HR 52 | Ht 64.0 in | Wt 177.2 lb

## 2023-08-15 DIAGNOSIS — I2583 Coronary atherosclerosis due to lipid rich plaque: Secondary | ICD-10-CM | POA: Diagnosis not present

## 2023-08-15 DIAGNOSIS — I6523 Occlusion and stenosis of bilateral carotid arteries: Secondary | ICD-10-CM

## 2023-08-15 DIAGNOSIS — G4733 Obstructive sleep apnea (adult) (pediatric): Secondary | ICD-10-CM | POA: Diagnosis not present

## 2023-08-15 DIAGNOSIS — I10 Essential (primary) hypertension: Secondary | ICD-10-CM

## 2023-08-15 DIAGNOSIS — R001 Bradycardia, unspecified: Secondary | ICD-10-CM

## 2023-08-15 DIAGNOSIS — I2510 Atherosclerotic heart disease of native coronary artery without angina pectoris: Secondary | ICD-10-CM | POA: Diagnosis not present

## 2023-08-15 MED ORDER — CARVEDILOL 3.125 MG PO TABS
3.1250 mg | ORAL_TABLET | Freq: Two times a day (BID) | ORAL | 11 refills | Status: DC
Start: 2023-08-15 — End: 2023-10-30

## 2023-08-15 NOTE — Progress Notes (Signed)
Cardiology Office Note   Date:  08/15/2023   ID:  TILGHMAN AQUILAR, DOB 07/15/1946, MRN [id number]  PCP:  Lauro Regulus, MD  Cardiologist:  Adrian Blackwater, MD      History of Present Illness: Patrick Turner is a 77 y.o. male who presents for  Chief Complaint  Patient presents with   Follow-up    3 month follow up    Had fallen 4 times, HR is low  Dizziness The current episode started 1 to 4 weeks ago. The problem has been resolved. Pertinent negatives include no chest pain, numbness, visual change or weakness.      Past Medical History:  Diagnosis Date   Acute on chronic renal failure (HCC) 03/23/2019   Acute on chronic renal insufficiency 05/30/2016   Altered mental status    Anemia    Anxiety    Aortic atherosclerosis (HCC)    Arthritis    Coronary artery disease    CRD (chronic renal disease)    Depression    Diabetes mellitus without complication (HCC)    Encephalopathy acute    GERD (gastroesophageal reflux disease)    Headache    Hypercholesteremia    Hypertension    Neuropathy    Severe obesity (HCC)    Sleep apnea    Sleep terror    per patient this year per patient      Past Surgical History:  Procedure Laterality Date   APPENDECTOMY     CIRCUMCISION     COLONOSCOPY     COLONOSCOPY WITH PROPOFOL N/A 07/10/2018   Procedure: COLONOSCOPY WITH PROPOFOL;  Surgeon: Christena Deem, MD;  Location: [facility] ENDOSCOPY;  Service: Endoscopy;  Laterality: N/A;   COLONOSCOPY WITH PROPOFOL N/A 04/23/2019   Procedure: COLONOSCOPY WITH PROPOFOL;  Surgeon: Christena Deem, MD;  Location: [facility] ENDOSCOPY;  Service: Endoscopy;  Laterality: N/A;   CORONARY STENT INTERVENTION N/A 10/03/2019   Procedure: CORONARY STENT INTERVENTION;  Surgeon: Alwyn Pea, MD;  Location: [facility] INVASIVE CV LAB;  Service: Cardiovascular;  Laterality: N/A;   ESOPHAGOGASTRODUODENOSCOPY     ESOPHAGOGASTRODUODENOSCOPY (EGD) WITH PROPOFOL N/A 04/23/2019   Procedure:  ESOPHAGOGASTRODUODENOSCOPY (EGD) WITH PROPOFOL;  Surgeon: Christena Deem, MD;  Location: [facility] ENDOSCOPY;  Service: Endoscopy;  Laterality: N/A;   INTRAMEDULLARY (IM) NAIL INTERTROCHANTERIC Left 09/17/2021   Procedure: INTRAMEDULLARY (IM) NAIL INTERTROCHANTRIC;  Surgeon: Joen Laura, MD;  Location: [facility] OR;  Service: Orthopedics;  Laterality: Left;   JOINT REPLACEMENT     KNEE ARTHROPLASTY Right 06/27/2016   Procedure: COMPUTER ASSISTED TOTAL KNEE ARTHROPLASTY;  Surgeon: Donato Heinz, MD;  Location: [facility] ORS;  Service: Orthopedics;  Laterality: Right;   KNEE ARTHROSCOPY Right    LEFT HEART CATH AND CORONARY ANGIOGRAPHY N/A 10/03/2019   Procedure: Left Heart Cath and possible Coronary intervention;  Surgeon: Laurier Nancy, MD;  Location: [facility] INVASIVE CV LAB;  Service: Cardiovascular;  Laterality: N/A;   TONSILLECTOMY     VISCERAL ANGIOGRAPHY N/A 05/23/2019   Procedure: VISCERAL ANGIOGRAPHY;  Surgeon: Annice Needy, MD;  Location: [facility] INVASIVE CV LAB;  Service: Cardiovascular;  Laterality: N/A;   VISCERAL ANGIOGRAPHY N/A 11/04/2019   Procedure: VISCERAL ANGIOGRAPHY;  Surgeon: Annice Needy, MD;  Location: [facility] INVASIVE CV LAB;  Service: Cardiovascular;  Laterality: N/A;   VISCERAL ANGIOGRAPHY N/A 12/14/2021   Procedure: VISCERAL ANGIOGRAPHY;  Surgeon: Annice Needy, MD;  Location: [facility] INVASIVE CV LAB;  Service: Cardiovascular;  Laterality: N/A;     Current Outpatient Medications  Medication Sig Dispense Refill   alendronate (FOSAMAX) 70 MG tablet Take 70 mg by mouth once a week. Take with a full glass of water on an empty stomach.     amLODipine (NORVASC) 10 MG tablet Take 1 tablet by mouth daily.     ascorbic acid (VITAMIN C) 500 MG tablet Take 500 mg by mouth daily.     aspirin 81 MG tablet Take 1 tablet (81 mg total) by mouth daily.     atorvastatin (LIPITOR) 80 MG tablet Take 80 mg by mouth daily.     carvedilol (COREG) 3.125 MG tablet Take 1 tablet (3.125 mg total) by mouth 2  (two) times daily. 60 tablet 11   clopidogrel (PLAVIX) 75 MG tablet TAKE ONE TABLET BY MOUTH EVERY DAY 90 tablet 1   donepezil (ARICEPT) 5 MG tablet Take 1 tablet (5 mg total) by mouth at bedtime. 30 tablet 0   escitalopram (LEXAPRO) 10 MG tablet Take 10 mg by mouth daily.     gabapentin (NEURONTIN) 400 MG capsule Take 400 mg by mouth 2 (two) times daily.     insulin glargine (LANTUS) 100 UNIT/ML injection Inject 11 Units into the skin daily.     metFORMIN (GLUCOPHAGE-XR) 500 MG 24 hr tablet SMARTSIG:2.0 Tablet(s) By Mouth Twice Daily     Omega-3 Fatty Acids (FISH OIL) 1000 MG CAPS Take 1,000 mg by mouth daily.     OZEMPIC, 0.25 OR 0.5 MG/DOSE, 2 MG/3ML SOPN Inject 0.5 mg into the skin once a week.     pantoprazole (PROTONIX) 40 MG tablet Take 40 mg by mouth daily.     QUEtiapine (SEROQUEL) 100 MG tablet Take 100 mg by mouth at bedtime.     ranolazine (RANEXA) 1000 MG SR tablet TAKE 1 TABLET TWICE DAILY 180 tablet 3   valsartan (DIOVAN) 40 MG tablet TAKE 1 TABLET EVERY DAY 90 tablet 3   vitamin B-12 (CYANOCOBALAMIN) 1000 MCG tablet Take 1,000 mcg by mouth daily.     insulin detemir (LEVEMIR) 100 UNIT/ML injection Inject 15 Units into the skin at bedtime. (Patient not taking: Reported on 08/15/2023)     iron polysaccharides (NIFEREX) 150 MG capsule Take 150 mg by mouth daily. (Patient not taking: Reported on 05/16/2023)     No current facility-administered medications for this visit.    Allergies:   Penicillins and Tavist [clemastine]    Social History:   reports that he has never smoked. He has never used smokeless tobacco. He reports current alcohol use. He reports that he does not use drugs.   Family History:  family history includes COPD in his father; Cancer in his father; Diabetes in his mother; Heart disease in his mother.    ROS:     Review of Systems  Constitutional: Negative.   HENT: Negative.    Eyes: Negative.   Respiratory: Negative.    Cardiovascular:  Negative for chest  pain.  Gastrointestinal: Negative.   Genitourinary: Negative.   Musculoskeletal: Negative.   Skin: Negative.   Neurological:  Positive for dizziness. Negative for weakness and numbness.  Endo/Heme/Allergies: Negative.   Psychiatric/Behavioral: Negative.    All other systems reviewed and are negative.     All other systems are reviewed and negative.    PHYSICAL EXAM: VS:  BP 120/62   Pulse (!) 52   Ht 5\' 4"  (1.626 m)   Wt 177 lb 3.2 oz (80.4 kg)   SpO2 99%   BMI 30.42 kg/m  , BMI Body mass index is  30.42 kg/m. Last weight:  Wt Readings from Last 3 Encounters:  08/15/23 177 lb 3.2 oz (80.4 kg)  05/16/23 178 lb 6.4 oz (80.9 kg)  05/04/23 179 lb (81.2 kg)     Physical Exam Vitals reviewed.  Constitutional:      Appearance: Normal appearance. He is normal weight.  HENT:     Head: Normocephalic.     Nose: Nose normal.     Mouth/Throat:     Mouth: Mucous membranes are moist.  Eyes:     Pupils: Pupils are equal, round, and reactive to light.  Cardiovascular:     Rate and Rhythm: Normal rate and regular rhythm.     Pulses: Normal pulses.     Heart sounds: Normal heart sounds.  Pulmonary:     Effort: Pulmonary effort is normal.  Abdominal:     General: Abdomen is flat. Bowel sounds are normal.  Musculoskeletal:        General: Normal range of motion.     Cervical back: Normal range of motion.  Skin:    General: Skin is warm.  Neurological:     General: No focal deficit present.     Mental Status: He is alert.  Psychiatric:        Mood and Affect: Mood normal.       EKG:   Recent Labs: 02/08/2023: ALT 13; BUN 32; Creatinine, Ser 1.53; Hemoglobin 11.7; Magnesium 2.1; Platelets 204; Potassium 4.5; Sodium 140    Lipid Panel    Component Value Date/Time   CHOL 88 02/08/2023 0430   TRIG 99 02/08/2023 0430   HDL 40 (L) 02/08/2023 0430   CHOLHDL 2.2 02/08/2023 0430   VLDL 20 02/08/2023 0430   LDLCALC 28 02/08/2023 0430      Other studies  Reviewed: Additional studies/ records that were reviewed today include:  Review of the above records demonstrates:       No data to display            ASSESSMENT AND PLAN:    ICD-10-CM   1. Bilateral carotid artery stenosis  I65.23 carvedilol (COREG) 3.125 MG tablet    2. Coronary artery disease due to lipid rich plaque  I25.10 carvedilol (COREG) 3.125 MG tablet   I25.83     3. HTN (hypertension), benign  I10 carvedilol (COREG) 3.125 MG tablet   LVEF normal, stress test normal.    4. OSA (obstructive sleep apnea)  G47.33 carvedilol (COREG) 3.125 MG tablet    5. Sinus bradycardia  R00.1    Has falls as gets dizzy, decrease coreg 3.125 bid, normal EF       Problem List Items Addressed This Visit       Cardiovascular and Mediastinum   Bilateral carotid artery disease (HCC) - Primary   Relevant Medications   amLODipine (NORVASC) 10 MG tablet   carvedilol (COREG) 3.125 MG tablet   HTN (hypertension), benign   Relevant Medications   amLODipine (NORVASC) 10 MG tablet   carvedilol (COREG) 3.125 MG tablet   CAD (coronary artery disease)   Relevant Medications   amLODipine (NORVASC) 10 MG tablet   carvedilol (COREG) 3.125 MG tablet   Other Visit Diagnoses     OSA (obstructive sleep apnea)       Relevant Medications   carvedilol (COREG) 3.125 MG tablet   Sinus bradycardia       Has falls as gets dizzy, decrease coreg 3.125 bid, normal EF   Relevant Medications   amLODipine (NORVASC) 10 MG tablet  carvedilol (COREG) 3.125 MG tablet          Disposition:   Return in about 3 months (around 11/13/2023).    Total time spent: 30 minutes  Signed,  Adrian Blackwater, MD  08/15/2023 1:13 PM    [facility]

## 2023-08-17 DIAGNOSIS — E1142 Type 2 diabetes mellitus with diabetic polyneuropathy: Secondary | ICD-10-CM | POA: Diagnosis not present

## 2023-08-23 DIAGNOSIS — E1122 Type 2 diabetes mellitus with diabetic chronic kidney disease: Secondary | ICD-10-CM | POA: Diagnosis not present

## 2023-08-23 DIAGNOSIS — R809 Proteinuria, unspecified: Secondary | ICD-10-CM | POA: Diagnosis not present

## 2023-08-23 DIAGNOSIS — D631 Anemia in chronic kidney disease: Secondary | ICD-10-CM | POA: Diagnosis not present

## 2023-08-23 DIAGNOSIS — N1831 Chronic kidney disease, stage 3a: Secondary | ICD-10-CM | POA: Diagnosis not present

## 2023-08-23 DIAGNOSIS — I129 Hypertensive chronic kidney disease with stage 1 through stage 4 chronic kidney disease, or unspecified chronic kidney disease: Secondary | ICD-10-CM | POA: Diagnosis not present

## 2023-08-23 DIAGNOSIS — E875 Hyperkalemia: Secondary | ICD-10-CM | POA: Diagnosis not present

## 2023-08-29 DIAGNOSIS — N2581 Secondary hyperparathyroidism of renal origin: Secondary | ICD-10-CM | POA: Diagnosis not present

## 2023-08-29 DIAGNOSIS — D631 Anemia in chronic kidney disease: Secondary | ICD-10-CM | POA: Diagnosis not present

## 2023-08-29 DIAGNOSIS — I129 Hypertensive chronic kidney disease with stage 1 through stage 4 chronic kidney disease, or unspecified chronic kidney disease: Secondary | ICD-10-CM | POA: Diagnosis not present

## 2023-08-29 DIAGNOSIS — N1831 Chronic kidney disease, stage 3a: Secondary | ICD-10-CM | POA: Diagnosis not present

## 2023-08-29 DIAGNOSIS — G20A1 Parkinson's disease without dyskinesia, without mention of fluctuations: Secondary | ICD-10-CM | POA: Diagnosis not present

## 2023-08-29 DIAGNOSIS — E875 Hyperkalemia: Secondary | ICD-10-CM | POA: Diagnosis not present

## 2023-08-29 DIAGNOSIS — R809 Proteinuria, unspecified: Secondary | ICD-10-CM | POA: Diagnosis not present

## 2023-08-29 DIAGNOSIS — E1122 Type 2 diabetes mellitus with diabetic chronic kidney disease: Secondary | ICD-10-CM | POA: Diagnosis not present

## 2023-09-26 ENCOUNTER — Emergency Department: Payer: Medicare HMO

## 2023-09-26 ENCOUNTER — Other Ambulatory Visit: Payer: Self-pay

## 2023-09-26 ENCOUNTER — Emergency Department
Admission: EM | Admit: 2023-09-26 | Discharge: 2023-09-27 | Disposition: A | Discharge status: Home / Self Care | Payer: Medicare HMO | Attending: Emergency Medicine | Admitting: Emergency Medicine

## 2023-09-26 DIAGNOSIS — R519 Headache, unspecified: Secondary | ICD-10-CM | POA: Insufficient documentation

## 2023-09-26 DIAGNOSIS — I129 Hypertensive chronic kidney disease with stage 1 through stage 4 chronic kidney disease, or unspecified chronic kidney disease: Secondary | ICD-10-CM | POA: Insufficient documentation

## 2023-09-26 DIAGNOSIS — E1122 Type 2 diabetes mellitus with diabetic chronic kidney disease: Secondary | ICD-10-CM | POA: Insufficient documentation

## 2023-09-26 DIAGNOSIS — I2510 Atherosclerotic heart disease of native coronary artery without angina pectoris: Secondary | ICD-10-CM | POA: Insufficient documentation

## 2023-09-26 DIAGNOSIS — M542 Cervicalgia: Secondary | ICD-10-CM | POA: Insufficient documentation

## 2023-09-26 DIAGNOSIS — Z96651 Presence of right artificial knee joint: Secondary | ICD-10-CM | POA: Insufficient documentation

## 2023-09-26 DIAGNOSIS — I6522 Occlusion and stenosis of left carotid artery: Secondary | ICD-10-CM | POA: Diagnosis not present

## 2023-09-26 DIAGNOSIS — I10 Essential (primary) hypertension: Secondary | ICD-10-CM | POA: Diagnosis not present

## 2023-09-26 DIAGNOSIS — N1832 Chronic kidney disease, stage 3b: Secondary | ICD-10-CM | POA: Diagnosis not present

## 2023-09-26 DIAGNOSIS — N179 Acute kidney failure, unspecified: Secondary | ICD-10-CM | POA: Diagnosis not present

## 2023-09-26 DIAGNOSIS — Z7982 Long term (current) use of aspirin: Secondary | ICD-10-CM | POA: Diagnosis not present

## 2023-09-26 DIAGNOSIS — I6523 Occlusion and stenosis of bilateral carotid arteries: Secondary | ICD-10-CM | POA: Diagnosis not present

## 2023-09-26 DIAGNOSIS — Z7984 Long term (current) use of oral hypoglycemic drugs: Secondary | ICD-10-CM | POA: Insufficient documentation

## 2023-09-26 DIAGNOSIS — Z7902 Long term (current) use of antithrombotics/antiplatelets: Secondary | ICD-10-CM | POA: Diagnosis not present

## 2023-09-26 DIAGNOSIS — Z79899 Other long term (current) drug therapy: Secondary | ICD-10-CM | POA: Diagnosis not present

## 2023-09-26 DIAGNOSIS — Z794 Long term (current) use of insulin: Secondary | ICD-10-CM | POA: Insufficient documentation

## 2023-09-26 DIAGNOSIS — I700 Atherosclerosis of aorta: Secondary | ICD-10-CM | POA: Diagnosis not present

## 2023-09-26 LAB — CBC WITH DIFFERENTIAL/PLATELET
Abs Immature Granulocytes: 0.02 10*3/uL (ref 0.00–0.07)
Basophils Absolute: 0 10*3/uL (ref 0.0–0.1)
Basophils Relative: 0 %
Eosinophils Absolute: 0.2 10*3/uL (ref 0.0–0.5)
Eosinophils Relative: 3 %
HCT: 37.2 % — ABNORMAL LOW (ref 39.0–52.0)
Hemoglobin: 12.3 g/dL — ABNORMAL LOW (ref 13.0–17.0)
Immature Granulocytes: 0 %
Lymphocytes Relative: 21 %
Lymphs Abs: 1.4 10*3/uL (ref 0.7–4.0)
MCH: 32.6 pg (ref 26.0–34.0)
MCHC: 33.1 g/dL (ref 30.0–36.0)
MCV: 98.7 fL (ref 80.0–100.0)
Monocytes Absolute: 0.7 10*3/uL (ref 0.1–1.0)
Monocytes Relative: 10 %
Neutro Abs: 4.6 10*3/uL (ref 1.7–7.7)
Neutrophils Relative %: 66 %
Platelets: 236 10*3/uL (ref 150–400)
RBC: 3.77 MIL/uL — ABNORMAL LOW (ref 4.22–5.81)
RDW: 13.2 % (ref 11.5–15.5)
WBC: 6.9 10*3/uL (ref 4.0–10.5)
nRBC: 0 % (ref 0.0–0.2)

## 2023-09-26 LAB — BASIC METABOLIC PANEL
Anion gap: 11 (ref 5–15)
BUN: 39 mg/dL — ABNORMAL HIGH (ref 8–23)
CO2: 21 mmol/L — ABNORMAL LOW (ref 22–32)
Calcium: 8.9 mg/dL (ref 8.9–10.3)
Chloride: 105 mmol/L (ref 98–111)
Creatinine, Ser: 1.63 mg/dL — ABNORMAL HIGH (ref 0.61–1.24)
GFR, Estimated: 43 mL/min — ABNORMAL LOW (ref >60)
Glucose, Bld: 123 mg/dL — ABNORMAL HIGH (ref 70–99)
Potassium: 4.9 mmol/L (ref 3.5–5.1)
Sodium: 137 mmol/L (ref 135–145)

## 2023-09-26 MED ORDER — SODIUM CHLORIDE 0.9 % IV BOLUS
1000.0000 mL | Freq: Once | INTRAVENOUS | Status: AC
  Administered 2023-09-27: 1000 mL via INTRAVENOUS

## 2023-09-26 MED ORDER — IOHEXOL 350 MG/ML SOLN
75.0000 mL | Freq: Once | INTRAVENOUS | Status: AC | PRN
  Administered 2023-09-26: 75 mL via INTRAVENOUS

## 2023-09-26 NOTE — ED Triage Notes (Addendum)
 Pt reports starting yesterday he began to feel off pt states he has partially clogged artery in his left neck, pt reports pain shooting up left side of his neck. Pt denies any new numbness to extremities, pt denies slurred speech or vision changes. Pt states he does have a small headache. Pt states he just does not feel right. Pt denies chest pain

## 2023-09-26 NOTE — ED Notes (Signed)
 Per MD Paduchowski, Head CT at this time

## 2023-09-26 NOTE — ED Provider Notes (Signed)
 [facility] Provider Note    Event Date/Time   First MD Initiated Contact with Patient 09/26/23 2310     (approximate)   History   Headache   HPI  Patrick Turner is a 78 y.o. male who presents to the ED from work at [employer] with a chief complaint of left neck pain radiating into his head.  Symptoms started 2 days ago.  States he felt off.  Reports carotid stenosis.  Denies associated vision changes, slurred speech, facial droop, extremity weakness/numbness/tingling.  Denies fever/chills, chest pain, shortness of breath, abdominal pain, nausea, vomiting or dizziness.    Past Medical History   Past Medical History:  Diagnosis Date   Acute on chronic renal failure (HCC) 03/23/2019   Acute on chronic renal insufficiency 05/30/2016   Altered mental status    Anemia    Anxiety    Aortic atherosclerosis (HCC)    Arthritis    Coronary artery disease    CRD (chronic renal disease)    Depression    Diabetes mellitus without complication (HCC)    Encephalopathy acute    GERD (gastroesophageal reflux disease)    Headache    Hypercholesteremia    Hypertension    Neuropathy    Severe obesity (HCC)    Sleep apnea    Sleep terror    per patient this year per patient      Active Problem List   Patient Active Problem List   Diagnosis Date Noted   Nonrheumatic aortic valve stenosis 02/03/2023   Hypertensive urgency 11/04/2021   Slow rate of speech 11/04/2021   Type II diabetes mellitus with renal manifestations (HCC) 11/04/2021   Depression with anxiety 11/04/2021   CAD (coronary artery disease) 11/04/2021   SAH (subarachnoid hemorrhage) (HCC) 11/04/2021   Mild dementia (HCC)    Stage 3b chronic kidney disease (HCC)    Closed left hip fracture, initial encounter (HCC) 09/16/2021   Anemia in chronic kidney disease 05/28/2020   Benign hypertensive kidney disease with chronic kidney disease 05/28/2020   Proteinuria 05/28/2020   Falls frequently  04/27/2020   Use of cane as ambulatory aid 04/27/2020   Numbness and tingling of both legs 02/18/2020   Primary osteoarthritis of left knee 01/04/2020   Diabetic retinopathy (HCC) 01/01/2020   ASCVD (arteriosclerotic cardiovascular disease) 10/16/2019   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 without macular edema, with long-term current use of insulin  (HCC) 10/16/2019   Hyperkalemia 10/14/2019   Pseudoaneurysm of right femoral artery (HCC) 10/14/2019   Unstable angina (HCC) 10/02/2019   Chest pain 10/01/2019   Uncontrolled type 2 diabetes mellitus with hyperglycemia, with long-term current use of insulin  (HCC) 09/27/2019   Type 2 diabetes mellitus with vascular disease (HCC) 09/27/2019   Chronic mesenteric ischemia (HCC) 05/06/2019   Abdominal pain 05/03/2019   AMS (altered mental status) 12/24/2018   Dizziness 06/30/2018   HLD (hyperlipidemia) 02/23/2018   Aortic atherosclerosis (HCC) 11/02/2017   Healthcare maintenance 12/26/2016   DM type 2 with diabetic peripheral neuropathy (HCC) 11/09/2016   Presence of right artificial knee joint 08/14/2016   Bilateral carotid artery disease (HCC) 07/12/2016   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   Encephalopathy acute 05/30/2016   Anemia 05/30/2016   Diarrhea 05/30/2016   Chronic kidney disease (CKD) stage G3b/A2, moderately decreased glomerular filtration rate (GFR) between 30-44 mL/min/1.73 square meter and albuminuria creatinine ratio between 30-299 mg/g (HCC) 03/30/2015   Depression, major, in remission (HCC)  08/02/2014   GERD (gastroesophageal reflux disease) 03/08/2014   Hyperlipidemia associated with type 2 diabetes mellitus (HCC) 03/08/2014   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/24/2014   Diabetes mellitus with stage 3 chronic kidney disease (HCC) 01/21/2014   HTN (hypertension), benign 01/21/2014   Transient cerebral ischemia 05/29/2012     Past Surgical History   Past  Surgical History:  Procedure Laterality Date   APPENDECTOMY     CIRCUMCISION     COLONOSCOPY     COLONOSCOPY WITH PROPOFOL  N/A 07/10/2018   Procedure: COLONOSCOPY WITH PROPOFOL ;  Surgeon: [name], MD;  Location[facility] ENDOSCOPY;  Service: Endoscopy;  Laterality: N/A;   COLONOSCOPY WITH PROPOFOL  N/A 04/23/2019   Procedure: COLONOSCOPY WITH PROPOFOL ;  Surgeon: [name], MD;  Location: [facility] ENDOSCOPY;  Service: Endoscopy;  Laterality: N/A;   CORONARY STENT INTERVENTION N/A 10/03/2019   Procedure: CORONARY STENT INTERVENTION;  Surgeon: Florencio Cara BIRCH, MD;  Location: [facility] INVASIVE CV LAB;  Service: Cardiovascular;  Laterality: N/A;   ESOPHAGOGASTRODUODENOSCOPY     ESOPHAGOGASTRODUODENOSCOPY (EGD) WITH PROPOFOL  N/A 04/23/2019   Procedure: ESOPHAGOGASTRODUODENOSCOPY (EGD) WITH PROPOFOL ;  Surgeon: [name], MD;  Location: [facility] ENDOSCOPY;  Service: Endoscopy;  Laterality: N/A;   INTRAMEDULLARY (IM) NAIL INTERTROCHANTERIC Left 09/17/2021   Procedure: INTRAMEDULLARY (IM) NAIL INTERTROCHANTRIC;  Surgeon: [name], MD;  Location: [facility] OR;  Service: Orthopedics;  Laterality: Left;   JOINT REPLACEMENT     KNEE ARTHROPLASTY Right 06/27/2016   Procedure: COMPUTER ASSISTED TOTAL KNEE ARTHROPLASTY;  Surgeon: [name], MD;  Location: [facility] ORS;  Service: Orthopedics;  Laterality: Right;   KNEE ARTHROSCOPY Right    LEFT HEART CATH AND CORONARY ANGIOGRAPHY N/A 10/03/2019   Procedure: Left Heart Cath and possible Coronary intervention;  Surgeon: Fernand Denyse LABOR, MD;  Location: [facility] INVASIVE CV LAB;  Service: Cardiovascular;  Laterality: N/A;   TONSILLECTOMY     VISCERAL ANGIOGRAPHY N/A 05/23/2019   Procedure: VISCERAL ANGIOGRAPHY;  Surgeon: [name], MD;  Location: [facility] INVASIVE CV LAB;  Service: Cardiovascular;  Laterality: N/A;   VISCERAL ANGIOGRAPHY N/A 11/04/2019   Procedure: VISCERAL ANGIOGRAPHY;  Surgeon: [name], MD;  Location: [facility] INVASIVE CV LAB;   Service: Cardiovascular;  Laterality: N/A;   VISCERAL ANGIOGRAPHY N/A 12/14/2021   Procedure: VISCERAL ANGIOGRAPHY;  Surgeon: [name], MD;  Location: [facility] INVASIVE CV LAB;  Service: Cardiovascular;  Laterality: N/A;     Home Medications   Prior to Admission medications   Medication Sig Start Date End Date Taking? Authorizing Provider  alendronate (FOSAMAX) 70 MG tablet Take 70 mg by mouth once a week. Take with a full glass of water on an empty stomach.    [provider]  amLODipine  (NORVASC ) 10 MG tablet Take 1 tablet by mouth daily. 07/12/23   [provider]  ascorbic acid  (VITAMIN C ) 500 MG tablet Take 500 mg by mouth daily.    [provider]  aspirin  81 MG tablet Take 1 tablet (81 mg total) by mouth daily. 02/23/18   Sainani, Vivek J, MD  atorvastatin  (LIPITOR ) 80 MG tablet Take 80 mg by mouth daily.    [provider]  carvedilol  (COREG ) 3.125 MG tablet Take 1 tablet (3.125 mg total) by mouth 2 (two) times daily. 08/15/23 08/14/24  Fernand Denyse LABOR, MD  clopidogrel  (PLAVIX ) 75 MG tablet TAKE ONE TABLET BY MOUTH EVERY DAY 07/07/23   Fernand Denyse LABOR, MD  donepezil  (ARICEPT ) 5 MG tablet Take 1 tablet (5 mg total) by mouth  at bedtime. 09/22/21   Paige, Victoria J, DO  escitalopram  (LEXAPRO ) 10 MG tablet Take 10 mg by mouth daily.    [provider]  gabapentin  (NEURONTIN ) 400 MG capsule Take 400 mg by mouth 2 (two) times daily.    [provider]  insulin  detemir (LEVEMIR ) 100 UNIT/ML injection Inject 15 Units into the skin at bedtime. Patient not taking: Reported on 08/15/2023 03/04/19   [provider]  insulin  glargine (LANTUS ) 100 UNIT/ML injection Inject 11 Units into the skin daily.    [provider]  iron  polysaccharides (NIFEREX) 150 MG capsule Take 150 mg by mouth daily. Patient not taking: Reported on 05/16/2023    [provider]  metFORMIN  (GLUCOPHAGE -XR) 500 MG 24 hr tablet SMARTSIG:2.0 Tablet(s) By  Mouth Twice Daily 07/12/23   [provider]  Omega-3 Fatty Acids (FISH OIL) 1000 MG CAPS Take 1,000 mg by mouth daily.    [provider]  OZEMPIC, 0.25 OR 0.5 MG/DOSE, 2 MG/3ML SOPN Inject 0.5 mg into the skin once a week. 11/11/22   [provider]  pantoprazole  (PROTONIX ) 40 MG tablet Take 40 mg by mouth daily.    [provider]  QUEtiapine  (SEROQUEL ) 100 MG tablet Take 100 mg by mouth at bedtime.    [provider]  ranolazine  (RANEXA ) 1000 MG SR tablet TAKE 1 TABLET TWICE DAILY 07/12/23   Fernand Alter A, MD  valsartan  (DIOVAN ) 40 MG tablet TAKE 1 TABLET EVERY DAY 06/05/23   Fernand Alter LABOR, MD  vitamin B-12 (CYANOCOBALAMIN ) 1000 MCG tablet Take 1,000 mcg by mouth daily.    [provider]     Allergies  Penicillins and Tavist [clemastine]   Family History   Family History  Problem Relation Age of Onset   Diabetes Mother    Heart disease Mother    Cancer Father    COPD Father      Physical Exam  Triage Vital Signs: ED Triage Vitals  Encounter Vitals Group     BP 09/26/23 2050 (!) 174/64     Systolic BP Percentile --      Diastolic BP Percentile --      Pulse Rate 09/26/23 2050 71     Resp 09/26/23 2050 18     Temp 09/26/23 2050 98.3 F (36.8 C)     Temp Source 09/26/23 2050 Oral     SpO2 09/26/23 2050 98 %     Weight 09/26/23 2051 166 lb (75.3 kg)     Height 09/26/23 2051 5' 5 (1.651 m)     Head Circumference --      Peak Flow --      Pain Score 09/26/23 2055 3     Pain Loc --      Pain Education --      Exclude from Growth Chart --     Updated Vital Signs: BP (!) 161/63   Pulse 74   Temp 98.4 F (36.9 C) (Oral)   Resp (!) 22   Ht 5' 5 (1.651 m)   Wt 75.3 kg   SpO2 100%   BMI 27.62 kg/m    General: Awake, no distress.  CV:  RRR.  Good peripheral perfusion.  Resp:  Normal effort.  CTAB. Abd:  Nontender.  No distention.  Other:  No carotid bruits.  Supple neck without meningismus.  Alert and  oriented x 3.  CN II-XII grossly intact.  5/5 motor strength and sensation all extremities.  MAE x 4.   ED  Results / Procedures / Treatments  Labs (all labs ordered are listed, but only abnormal results are displayed) Labs Reviewed  CBC WITH DIFFERENTIAL/PLATELET - Abnormal; Notable for the following components:      Result Value   RBC 3.77 (*)    Hemoglobin 12.3 (*)    HCT 37.2 (*)    All other components within normal limits  BASIC METABOLIC PANEL - Abnormal; Notable for the following components:   CO2 21 (*)    Glucose, Bld 123 (*)    BUN 39 (*)    Creatinine, Ser 1.63 (*)    GFR, Estimated 43 (*)    All other components within normal limits  TROPONIN I (HIGH SENSITIVITY)     EKG  ED ECG REPORT I, Cleland Simkins J, the attending physician, personally viewed and interpreted this ECG.   Date: 09/27/2023  EKG Time: 0147  Rate: 71  Rhythm: normal sinus rhythm  Axis: Normal  Intervals:none  ST&T Change: Nonspecific    RADIOLOGY I have independently visualized and interpreted patient's imaging study as well as noted the radiology interpretation:  CT head: No ICH  CTA head/neck: No acute process  Official radiology report(s): CT ANGIO HEAD NECK W WO CM Result Date: 09/27/2023 CLINICAL DATA:  Headache and neck pain EXAM: CT ANGIOGRAPHY HEAD AND NECK WITH AND WITHOUT CONTRAST TECHNIQUE: Multidetector CT imaging of the head and neck was performed using the standard protocol during bolus administration of intravenous contrast. Multiplanar CT image reconstructions and MIPs were obtained to evaluate the vascular anatomy. Carotid stenosis measurements (when applicable) are obtained utilizing NASCET criteria, using the distal internal carotid diameter as the denominator. RADIATION DOSE REDUCTION: This exam was performed according to the departmental dose-optimization program which includes automated exposure control, adjustment of the mA and/or kV according to patient size and/or use  of iterative reconstruction technique. CONTRAST:  75mL OMNIPAQUE  IOHEXOL  350 MG/ML SOLN COMPARISON:  None Available. FINDINGS: CTA NECK FINDINGS Skeleton: No acute abnormality or high grade bony spinal canal stenosis. Other neck: Normal pharynx, larynx and major salivary glands. No cervical lymphadenopathy. Unremarkable thyroid  gland. Upper chest: No pneumothorax or pleural effusion. No nodules or masses. Aortic arch: There is calcific atherosclerosis of the aortic arch. Conventional 3 vessel aortic branching pattern. RIGHT carotid system: Normal without aneurysm, dissection or stenosis. LEFT carotid system: No dissection, occlusion or aneurysm. Mild atherosclerotic calcification at the carotid bifurcation without hemodynamically significant stenosis. Vertebral arteries: Codominant configuration. There is no dissection, occlusion or flow-limiting stenosis to the skull base (V1-V3 segments). CTA HEAD FINDINGS POSTERIOR CIRCULATION: Vertebral arteries are normal. No proximal occlusion of the anterior or inferior cerebellar arteries. Basilar artery is normal. Superior cerebellar arteries are normal. Posterior cerebral arteries are normal. ANTERIOR CIRCULATION: Atherosclerotic calcification of the internal carotid arteries at the skull base without hemodynamically significant stenosis. Anterior cerebral arteries are normal. Middle cerebral arteries are normal. Venous sinuses: As permitted by contrast timing, patent. Anatomic variants: None Review of the MIP images confirms the above findings. IMPRESSION: 1. No emergent large vessel occlusion or hemodynamically significant stenosis of the head or neck. Aortic Atherosclerosis (ICD10-I70.0). Electronically Signed   By: Franky Stanford M.D.   On: 09/27/2023 00:27   CT HEAD WO CONTRAST ( ) Result Date: 09/26/2023 CLINICAL DATA:  Headache EXAM: CT HEAD WITHOUT CONTRAST TECHNIQUE: Contiguous axial images were obtained from the base of the skull through the vertex without  intravenous contrast. RADIATION DOSE REDUCTION: This exam was performed according to the departmental dose-optimization program which includes automated exposure  control, adjustment of the mA and/or kV according to patient size and/or use of iterative reconstruction technique. COMPARISON:  02/07/2023 FINDINGS: Brain: No mass,hemorrhage or extra-axial collection. Normal appearance of the parenchyma and CSF spaces. Vascular: Atherosclerotic calcification of the internal carotid arteries at the skull base. No abnormal hyperdensity of the major intracranial arteries or dural venous sinuses. Skull: The visualized skull base, calvarium and extracranial soft tissues are normal. Sinuses/Orbits: No fluid levels or advanced mucosal thickening of the visualized paranasal sinuses. No mastoid or middle ear effusion. Normal orbits. Other: None. IMPRESSION: No acute intracranial abnormality. Electronically Signed   By: Franky Stanford M.D.   On: 09/26/2023 21:52     PROCEDURES:  Critical Care performed: No  .1-3 Lead EKG Interpretation  Performed by: Robinette Vermell PARAS, MD Authorized by: Robinette Vermell PARAS, MD     Interpretation: normal     ECG rate:  70   ECG rate assessment: normal     Rhythm: sinus rhythm     Ectopy: none     Conduction: normal   Comments:     Patient placed on cardiac monitor to evaluate for arrhythmias    MEDICATIONS ORDERED IN ED: Medications  sodium chloride  0.9 % bolus 1,000 mL (0 mLs Intravenous Stopped 09/27/23 0116)  iohexol  (OMNIPAQUE ) 350 MG/ML injection 75 mL (75 mLs Intravenous Contrast Given 09/26/23 2349)     IMPRESSION / MDM / ASSESSMENT AND PLAN / ED COURSE  I reviewed the triage vital signs and the nursing notes.                             78 year old male presenting with left-sided neck pain and headache. Differential diagnosis includes, but is not limited to, intracranial hemorrhage, meningitis/encephalitis, previous head trauma, cavernous venous thrombosis, tension  headache, temporal arteritis, migraine or migraine equivalent, idiopathic intracranial hypertension, and non-specific headache.  I personally reviewed patient's records and noted nephrology office visit on 08/28/2025 for for follow-up of CKD.  Patient's presentation is most consistent with acute complicated illness / injury requiring diagnostic workup.  The patient is on the cardiac monitor to evaluate for evidence of arrhythmia and/or significant heart rate changes.  Laboratory results demonstrate normal WBC 6.9, stable renal function with creatinine 1.63.  Will check troponin, EKG.  Will obtain CTA head and neck.  Patient declines analgesia as he rates his pain currently only a 3/10.  Will reassess.  Clinical Course as of 09/27/23 0151  Wed Sep 27, 2023  0150 CTA head/neck unremarkable for acute process.  Troponin and EKG unremarkable.  Patient is stable for discharge home with close follow-up with his PCP.  Strict return precautions given.  Patient verbalizes understanding and agrees with plan of care. [JS]    Clinical Course User Index [JS] Robinette Vermell PARAS, MD     FINAL CLINICAL IMPRESSION(S) / ED DIAGNOSES   Final diagnoses:  Acute nonintractable headache, unspecified headache type  Neck pain     Rx / DC Orders   ED Discharge Orders     None        Note:  This document was prepared using Dragon voice recognition software and may include unintentional dictation errors.   Shanard Treto J, MD 09/27/23 [phone]

## 2023-09-27 LAB — TROPONIN I (HIGH SENSITIVITY): Troponin I (High Sensitivity): 7 ng/L (ref <18)

## 2023-09-27 NOTE — Discharge Instructions (Signed)
You may take Tylenol and/or Ibuprofen as needed for discomfort.  Apply moist heat to affected area several times daily.  Return to the ER for worsening symptoms, persistent vomiting, difficulty breathing or other concerns.

## 2023-10-02 ENCOUNTER — Other Ambulatory Visit: Payer: Self-pay

## 2023-10-02 ENCOUNTER — Emergency Department: Payer: Medicare HMO

## 2023-10-02 ENCOUNTER — Emergency Department
Admission: EM | Admit: 2023-10-02 | Discharge: 2023-10-03 | Disposition: A | Discharge status: Home / Self Care | Payer: Medicare HMO | Attending: Emergency Medicine | Admitting: Emergency Medicine

## 2023-10-02 DIAGNOSIS — R410 Disorientation, unspecified: Secondary | ICD-10-CM

## 2023-10-02 DIAGNOSIS — I6782 Cerebral ischemia: Secondary | ICD-10-CM | POA: Diagnosis not present

## 2023-10-02 DIAGNOSIS — F05 Delirium due to known physiological condition: Secondary | ICD-10-CM | POA: Insufficient documentation

## 2023-10-02 DIAGNOSIS — F331 Major depressive disorder, recurrent, moderate: Secondary | ICD-10-CM | POA: Insufficient documentation

## 2023-10-02 DIAGNOSIS — F419 Anxiety disorder, unspecified: Secondary | ICD-10-CM | POA: Diagnosis not present

## 2023-10-02 DIAGNOSIS — I2510 Atherosclerotic heart disease of native coronary artery without angina pectoris: Secondary | ICD-10-CM | POA: Diagnosis not present

## 2023-10-02 DIAGNOSIS — K5730 Diverticulosis of large intestine without perforation or abscess without bleeding: Secondary | ICD-10-CM | POA: Diagnosis not present

## 2023-10-02 DIAGNOSIS — Z79899 Other long term (current) drug therapy: Secondary | ICD-10-CM | POA: Insufficient documentation

## 2023-10-02 DIAGNOSIS — F29 Unspecified psychosis not due to a substance or known physiological condition: Secondary | ICD-10-CM | POA: Diagnosis not present

## 2023-10-02 DIAGNOSIS — I10 Essential (primary) hypertension: Secondary | ICD-10-CM | POA: Diagnosis not present

## 2023-10-02 DIAGNOSIS — R1032 Left lower quadrant pain: Secondary | ICD-10-CM | POA: Diagnosis not present

## 2023-10-02 DIAGNOSIS — F330 Major depressive disorder, recurrent, mild: Secondary | ICD-10-CM

## 2023-10-02 DIAGNOSIS — F32A Depression, unspecified: Secondary | ICD-10-CM

## 2023-10-02 DIAGNOSIS — R739 Hyperglycemia, unspecified: Secondary | ICD-10-CM | POA: Diagnosis not present

## 2023-10-02 DIAGNOSIS — R4182 Altered mental status, unspecified: Secondary | ICD-10-CM | POA: Diagnosis not present

## 2023-10-02 DIAGNOSIS — F03918 Unspecified dementia, unspecified severity, with other behavioral disturbance: Secondary | ICD-10-CM

## 2023-10-02 DIAGNOSIS — F02A2 Dementia in other diseases classified elsewhere, mild, with psychotic disturbance: Secondary | ICD-10-CM | POA: Insufficient documentation

## 2023-10-02 DIAGNOSIS — E119 Type 2 diabetes mellitus without complications: Secondary | ICD-10-CM | POA: Insufficient documentation

## 2023-10-02 LAB — SALICYLATE LEVEL: Salicylate Lvl: <7 mg/dL — ABNORMAL LOW (ref 7.0–30.0)

## 2023-10-02 LAB — CBC WITH DIFFERENTIAL/PLATELET
Abs Immature Granulocytes: 0.04 10*3/uL (ref 0.00–0.07)
Basophils Absolute: 0 10*3/uL (ref 0.0–0.1)
Basophils Relative: 0 %
Eosinophils Absolute: 0.2 10*3/uL (ref 0.0–0.5)
Eosinophils Relative: 2 %
HCT: 41.1 % (ref 39.0–52.0)
Hemoglobin: 13.9 g/dL (ref 13.0–17.0)
Immature Granulocytes: 1 %
Lymphocytes Relative: 18 %
Lymphs Abs: 1.5 10*3/uL (ref 0.7–4.0)
MCH: 32.6 pg (ref 26.0–34.0)
MCHC: 33.8 g/dL (ref 30.0–36.0)
MCV: 96.5 fL (ref 80.0–100.0)
Monocytes Absolute: 0.7 10*3/uL (ref 0.1–1.0)
Monocytes Relative: 9 %
Neutro Abs: 5.9 10*3/uL (ref 1.7–7.7)
Neutrophils Relative %: 70 %
Platelets: 297 10*3/uL (ref 150–400)
RBC: 4.26 MIL/uL (ref 4.22–5.81)
RDW: 13.3 % (ref 11.5–15.5)
WBC: 8.4 10*3/uL (ref 4.0–10.5)
nRBC: 0 % (ref 0.0–0.2)

## 2023-10-02 LAB — COMPREHENSIVE METABOLIC PANEL
ALT: 21 U/L (ref 0–44)
AST: 28 U/L (ref 15–41)
Albumin: 4.4 g/dL (ref 3.5–5.0)
Alkaline Phosphatase: 72 U/L (ref 38–126)
Anion gap: 13 (ref 5–15)
BUN: 24 mg/dL — ABNORMAL HIGH (ref 8–23)
CO2: 22 mmol/L (ref 22–32)
Calcium: 9.1 mg/dL (ref 8.9–10.3)
Chloride: 101 mmol/L (ref 98–111)
Creatinine, Ser: 1.32 mg/dL — ABNORMAL HIGH (ref 0.61–1.24)
GFR, Estimated: 55 mL/min — ABNORMAL LOW (ref >60)
Glucose, Bld: 212 mg/dL — ABNORMAL HIGH (ref 70–99)
Potassium: 4 mmol/L (ref 3.5–5.1)
Sodium: 136 mmol/L (ref 135–145)
Total Bilirubin: 1.1 mg/dL (ref 0.0–1.2)
Total Protein: 7.6 g/dL (ref 6.5–8.1)

## 2023-10-02 LAB — URINALYSIS, ROUTINE W REFLEX MICROSCOPIC
Bilirubin Urine: NEGATIVE
Glucose, UA: NEGATIVE mg/dL
Hgb urine dipstick: NEGATIVE
Ketones, ur: NEGATIVE mg/dL
Leukocytes,Ua: NEGATIVE
Nitrite: NEGATIVE
Protein, ur: 30 mg/dL — AB
Specific Gravity, Urine: 1.019 (ref 1.005–1.030)
pH: 7 (ref 5.0–8.0)

## 2023-10-02 LAB — RESP PANEL BY RT-PCR (RSV, FLU A&B, COVID)  RVPGX2
Influenza A by PCR: NEGATIVE
Influenza B by PCR: NEGATIVE
Resp Syncytial Virus by PCR: NEGATIVE
SARS Coronavirus 2 by RT PCR: NEGATIVE

## 2023-10-02 LAB — URINE DRUG SCREEN, QUALITATIVE (ARMC ONLY)
Amphetamines, Ur Screen: NOT DETECTED
Barbiturates, Ur Screen: NOT DETECTED
Benzodiazepine, Ur Scrn: NOT DETECTED
Cannabinoid 50 Ng, Ur ~~LOC~~: NOT DETECTED
Cocaine Metabolite,Ur ~~LOC~~: NOT DETECTED
MDMA (Ecstasy)Ur Screen: NOT DETECTED
Methadone Scn, Ur: NOT DETECTED
Opiate, Ur Screen: NOT DETECTED
Phencyclidine (PCP) Ur S: NOT DETECTED
Tricyclic, Ur Screen: POSITIVE — AB

## 2023-10-02 LAB — CBG MONITORING, ED: Glucose-Capillary: 236 mg/dL — ABNORMAL HIGH (ref 70–99)

## 2023-10-02 LAB — TROPONIN I (HIGH SENSITIVITY): Troponin I (High Sensitivity): 5 ng/L (ref <18)

## 2023-10-02 LAB — ETHANOL: Alcohol, Ethyl (B): <10 mg/dL (ref <10)

## 2023-10-02 LAB — ACETAMINOPHEN LEVEL: Acetaminophen (Tylenol), Serum: <10 ug/mL — ABNORMAL LOW (ref 10–30)

## 2023-10-02 MED ORDER — HALOPERIDOL LACTATE 5 MG/ML IJ SOLN
5.0000 mg | Freq: Once | INTRAMUSCULAR | Status: AC
  Administered 2023-10-02: 5 mg via INTRAVENOUS
  Filled 2023-10-02: qty 1

## 2023-10-02 MED ORDER — IOHEXOL 300 MG/ML  SOLN
100.0000 mL | Freq: Once | INTRAMUSCULAR | Status: AC | PRN
  Administered 2023-10-02: 100 mL via INTRAVENOUS

## 2023-10-02 NOTE — ED Triage Notes (Addendum)
Pt arrives via EMS from work after breaking down at work and crying. EMS reports mental health hx. Pt talking in sing song voice and repeating "it's a dream." Pt tearful and says his head hurts. It is difficult to get details from pt in triage. Pt is following commands and able to raise his arms equally.

## 2023-10-02 NOTE — ED Provider Triage Note (Signed)
Emergency Medicine Provider Triage Evaluation Note  ELLI HUDEK , a 78 y.o. male  was evaluated in triage.  Pt complains of altered mental status.  Brought in by EMS from [employer] where he started crying uncontrollably at work.  Patient reports "this is all a dream."  Family arrived and says that he has done this before. Patient not answering questions in triage, just crying and saying "this is a dream."  Review of Systems  Positive: ams Negative: fever  Physical Exam  BP (!) 177/79   Pulse (!) 101   Resp (!) 22   SpO2 100%  Gen:   Awake,  crying Resp:  Normal effort  MSK:   Moves extremities without difficulty  Other:  Face symmetric, follows commands, moving all extremities normally  Medical Decision Making  Medically screening exam initiated at 5:49 PM.  Appropriate orders placed.  Veverly Fells Riner was informed that the remainder of the evaluation will be completed by another provider, this initial triage assessment does not replace that evaluation, and the importance of remaining in the ED until their evaluation is complete.     Jackelyn Hoehn, PA-C 10/02/23 1749

## 2023-10-02 NOTE — ED Provider Notes (Signed)
[facility] Provider Note    Event Date/Time   First MD Initiated Contact with Patient 10/02/23 1939     (approximate)   History   Altered Mental Status   HPI KAY KHAN is a 78 y.o. male with history of of DM2, CKD, HTN, HLD, parkinsonism, mild dementia presenting today for altered mental status.  Patient was poorly at work today when he all of a sudden broke down and started crying.  He has been singing as well as saying it is a dream.  He intermittently will state that his head hurts but is denying that at this time.  Also noted pain in his abdomen.  Otherwise able to follow all commands and answer other questions appropriately when he is not crying.  Not able to contribute significantly to any other history.  Son in the room reports patient had a similar episode in the past which she was observed in the hospital for a couple days but no obvious source of what caused it.  Chart review: Reviewed admission from 02/07/2023 for patient having acute delirium with a sudden onset of confusion.  No obvious evidence of a stroke.  Further workup for metabolic concerns of delirium required patient to be admitted.  While in the hospital patient had negative MRI for stroke.  EEG unremarkable.  Echo unremarkable.  No acute findings and possibly related to polypharmacy.  And patient was discharged.     Physical Exam   Triage Vital Signs: ED Triage Vitals  Encounter Vitals Group     BP 10/02/23 1729 (!) 177/79     Systolic BP Percentile --      Diastolic BP Percentile --      Pulse Rate 10/02/23 1729 (!) 101     Resp 10/02/23 1729 (!) 22     Temp 10/02/23 1843 98.3 F (36.8 C)     Temp src --      SpO2 10/02/23 1729 100 %     Weight --      Height --      Head Circumference --      Peak Flow --      Pain Score --      Pain Loc --      Pain Education --      Exclude from Growth Chart --     Most recent vital signs: Vitals:   10/02/23 1843 10/02/23 2153   BP:  (!) 185/66  Pulse:  65  Resp:  16  Temp: 98.3 F (36.8 C) 98 F (36.7 C)  SpO2:  98%   I have reviewed the vital signs. General:  Awake, intermittently screaming and crying on exam and then goes back to baseline.  Stating repeatedly "it is a dream" Head:  Normocephalic, Atraumatic. EENT:  PERRL, EOMI, Oral mucosa pink and moist, Neck is supple. Cardiovascular: Regular rate, 2+ distal pulses. Respiratory:  Normal respiratory effort, symmetrical expansion, no distress.   Abdomen: Soft, nondistended, tender to palpation left lower quadrant Extremities:  Moving all four extremities through full ROM without pain.   Neuro:  Alert and oriented.  Intermittently screaming and crying but then otherwise we will be able to provide history.  Reassessed after Haldol and cranial nerves II through XII intact.  No acute weakness.  5 out of 5 strength bilaterally in upper and lower extremities.  Sensation equal intact in bilateral upper and lower extremities. Skin:  Warm, dry, no rash.   Psych: Appropriate affect.  ED Results / Procedures / Treatments   Labs (all labs ordered are listed, but only abnormal results are displayed) Labs Reviewed  ACETAMINOPHEN LEVEL - Abnormal; Notable for the following components:      Result Value   Acetaminophen (Tylenol), Serum <10 (*)    All other components within normal limits  URINALYSIS, ROUTINE W REFLEX MICROSCOPIC - Abnormal; Notable for the following components:   Color, Urine YELLOW (*)    APPearance CLEAR (*)    Protein, ur 30 (*)    Bacteria, UA RARE (*)    All other components within normal limits  COMPREHENSIVE METABOLIC PANEL - Abnormal; Notable for the following components:   Glucose, Bld 212 (*)    BUN 24 (*)    Creatinine, Ser 1.32 (*)    GFR, Estimated 55 (*)    All other components within normal limits  SALICYLATE LEVEL - Abnormal; Notable for the following components:   Salicylate Lvl <7.0 (*)    All other components within  normal limits  URINE DRUG SCREEN, QUALITATIVE ([facility] ONLY) - Abnormal; Notable for the following components:   Tricyclic, Ur Screen POSITIVE (*)    All other components within normal limits  CBG MONITORING, ED - Abnormal; Notable for the following components:   Glucose-Capillary 236 (*)    All other components within normal limits  RESP PANEL BY RT-PCR (RSV, FLU A&B, COVID)  [id number]  CBC WITH DIFFERENTIAL/PLATELET  ETHANOL  TROPONIN I (HIGH SENSITIVITY)     EKG    RADIOLOGY Independently interpreted CT head, CT abdomen/pelvis and MRI brain with no acute pathology   PROCEDURES:  Critical Care performed: No  Procedures   MEDICATIONS ORDERED IN ED: Medications  haloperidol lactate (HALDOL) injection 5 mg (5 mg Intravenous Given 10/02/23 2037)  iohexol (OMNIPAQUE) 300 MG/ML solution 100 mL (100 mLs Intravenous Contrast Given 10/02/23 2058)     IMPRESSION / MDM / ASSESSMENT AND PLAN / ED COURSE  I reviewed the triage vital signs and the nursing notes.                              Differential diagnosis includes, but is not limited to, delirium, UTI, pneumonia, diverticulitis, depression, mania, less likely CVA, metabolic encephalopathy  Patient's presentation is most consistent with acute complicated illness / injury requiring diagnostic workup.  Patient is a 78 year old male presenting today over concerns of altered mental status.  Reportedly had an episode at work with crying and screaming.  He is doing the same thing here on arrival.  Will intermittently talk to me but cannot significantly provide history.  Son in room does note he has had some stress recently and has had prior psychiatric issues.  There was concern for depression and suicidality.  Patient was medically evaluated first for clearance.  He was given Haldol.  Laboratory workup for the most part largely reassuring with no acute pathology or explanation for metabolic encephalopathy.  CT imaging of the head  unremarkable.  CT abdomen/pelvis shows no acute pathology.  MRI brain was ordered given that patient was not contributory to history to rule out CVA.  This was also negative.  Patient was reassessed following Haldol and completely back to baseline.  He was talking me with no acute issues.  No neurological deficits.  Vital signs stable.  Patient at this time does admit to increased stress along with depression recently.  Will have psychiatry evaluate him for further care.  The  patient has been placed in psychiatric observation due to the need to provide a safe environment for the patient while obtaining psychiatric consultation and evaluation, as well as ongoing medical and medication management to treat the patient's condition.  The patient has not been placed under full IVC at this time.  Clinical Course as of 10/02/23 2250  Mon Oct 02, 2023  2244 Patient with significant symptomatic improvement following Haldol and is now conversant with no acute neurological concerns.  Does admit to depression and increased stress recently which I suspect triggered today's event.  Given traumatic episode, will have psychiatry evaluate patient further.  Medically cleared otherwise. [DW]    Clinical Course User Index [DW] Janith Lima, MD     FINAL CLINICAL IMPRESSION(S) / ED DIAGNOSES   Final diagnoses:  Altered mental status, unspecified altered mental status type  Depression, unspecified depression type     Rx / DC Orders   ED Discharge Orders     None        Note:  This document was prepared using Dragon voice recognition software and may include unintentional dictation errors.   Janith Lima, MD 10/02/23 2255

## 2023-10-02 NOTE — ED Triage Notes (Signed)
Pt comes via [facility] from [place] with c/o manic break while at work. Pt has psych hx. Pt is on meds for this. Pt denies any SI  or HI  213/93 78 100% CBG 299

## 2023-10-03 ENCOUNTER — Encounter: Payer: Self-pay | Admitting: Psychiatry

## 2023-10-03 DIAGNOSIS — F330 Major depressive disorder, recurrent, mild: Secondary | ICD-10-CM

## 2023-10-03 DIAGNOSIS — R410 Disorientation, unspecified: Secondary | ICD-10-CM

## 2023-10-03 DIAGNOSIS — F419 Anxiety disorder, unspecified: Secondary | ICD-10-CM

## 2023-10-03 DIAGNOSIS — F03918 Unspecified dementia, unspecified severity, with other behavioral disturbance: Secondary | ICD-10-CM

## 2023-10-03 MED ORDER — QUETIAPINE FUMARATE 25 MG PO TABS: 25.0000 mg | ORAL_TABLET | Freq: Every morning | ORAL | 2 refills | Status: AC

## 2023-10-03 NOTE — Consult Note (Signed)
[facility] Consult Note  Patient Name: Patrick Turner MRN: [id number] DOB: [birth year] DATE OF Consult: 10/03/2023  PRIMARY PSYCHIATRIC DIAGNOSES  1.  Acute Delirium of unknown 2.  Other specified anxiety disorders 3.  MDD, recurrent, mod 4. 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    If yes:       []   Pt meets involuntary commitment criteria if not voluntary       []    Pt does not meet involuntary commitment criteria and must be         voluntary. If patient is not voluntary, then discharge is recommended.   Medication recommendations:  recommendations for increase quetiapine 25mg  po in morning for mood/anxiety, continue with quetiapine 100mg  po at bedtime;  recommend discuss with outpatient psychiatrist evaluation of donepezil with neurocognitive evaluation to determine if dosage needs increased. Continue with outpatient antidepressant escitalopram 10mg  po daily for depression   communication to treatment via epic chat I have discussed my assessment and treatment recommendations with the patient. Possible medication side effects/risks/benefits of current regimen.   Importance of medication adherence for medication to be beneficial.   Follow-Up Telepsychiatry C/L services:            []  We will continue to follow this patient with you.             [x]  Will sign off for now. Please re-consult our service as necessary.  Thank you for involving [place] in the care of this patient. If you have any additional questions or concerns, please call [phone] and ask for me or the provider on-call.  TELEPSYCHIATRY ATTESTATION & CONSENT  As the  provider for this telehealth consult, I attest that I verified the patient's identity using two separate identifiers, introduced myself to the patient, provided my credentials, disclosed my location, and performed this encounter via a HIPAA-compliant, real-time, face-to-face, two-way, interactive audio and video platform and with the full consent and agreement of the patient (or guardian as applicable.)  Patient physical location: [facility]. Telehealth provider physical location: home office in state of [place]  Video start time: 00:52 am (Central Time) Video end time: 01:27 am (Central Time)  IDENTIFYING DATA  Patrick Turner is a 78 y.o. year-old male for whom a psychiatric consultation has been ordered by the primary provider. The patient was identified using two separate identifiers.  CHIEF COMPLAINT/REASON FOR CONSULT  "The first thing always on my mind is my wife doesn't love me, then there is we both are trying to retire, it's complicated as far as money, that is bothering me, work get cussed out at least 2 times a day because you ask for ID or can't sell them cig and beer, every time turn around policy is changed and get in trouble for that" reports working 20 yrs    HISTORY OF PRESENT ILLNESS (HPI)  The  78 yo male patient history of of DM2, CKD, HTN, HLD, parkinsonism, mild dementia presenting today for altered mental status. Has been given haldol in ED this evening; Reportedly had an episode at work with crying and screaming. He is doing the same thing here on arrival.  Medical workup completed, reports "back to baseline" after haldol given. He is no longer screaming or confused.  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  Patient reports hx of hx of "sleep terror" reports last occurred "several years ago" and he stated they related it to his insulin and changed the medication "then it stopped".    Today, he is alert and oriented x 4 and knows the president of [place] changed today. client denied symptoms of depression with anergia, anhedonia, amotivation, he does report frequent anxiety, frequent worry, feeling restlessness, has lot of stressors with finances, work, reports 30 yrs ago wife wanted a divorce and now ruminates on this, feels she doesn't love him;  reported hx of panic symptoms, reports last occurred "few years"  no reported obsessive/compulsive behaviors. Client denies active SI/HI ideations, plans or intent. There is no evidence of psychosis or delusional thinking.  Client denied past episodes of hypomania, hyperactivity, erratic/excessive spending, involvement in dangerous activities, self-inflated ego, grandiosity, or promiscuity.  sleeping 12hrs/24hrs, reports appetite decreased, concentration decreased;  No self-harm behaviors. Reviewed active outpatient medication list/reviewed labs. Obtained Collateral information from medical record.  PAST PSYCHIATRIC HISTORY    Previous Psychiatric Hospitalizations: once 20+ yrs ago for suicide/homicidal thoughts toward coworker/depression Previous Detox/Residential treatments: denied Outpt treatment:  outpatient psychiatrist Dr. Jackquline Berlin;  has upcoming appt next week "Dr. Maggie Schwalbe" Previous psychotropic medication trials:  Previous mental health diagnosis per client/MEDICAL RECORD NUMBERdepression/anxiety  Suicide attempts/self-injurious behaviors:  denied history of suicidal/homicidal ideation/gestures; denied history of self-harm behaviors   PAST  MEDICAL HISTORY  Past Medical History:  Diagnosis Date   Acute on chronic renal failure (HCC) 03/23/2019   Acute on chronic renal insufficiency 05/30/2016   Altered mental status    Anemia    Anxiety    Aortic atherosclerosis (HCC)    Arthritis    Coronary artery disease    CRD (chronic renal disease)    Depression    Diabetes mellitus without complication (HCC)    Encephalopathy acute    GERD (gastroesophageal reflux disease)    Headache    Hypercholesteremia    Hypertension    Neuropathy    Severe obesity (HCC)    Sleep apnea    Sleep terror    per patient this year per patient      HOME MEDICATIONS  Facility Ordered Medications  Medication   [COMPLETED] haloperidol lactate (HALDOL) injection 5 mg   [COMPLETED] iohexol (OMNIPAQUE) 300 MG/ML solution 100 mL   PTA Medications  Medication Sig   atorvastatin (LIPITOR) 80 MG tablet Take 80 mg by mouth daily.   vitamin B-12 (CYANOCOBALAMIN) 1000 MCG tablet Take 1,000 mcg by mouth daily.   aspirin 81 MG tablet Take 1 tablet (81 mg total) by mouth daily.   Omega-3 Fatty Acids (FISH OIL) 1000 MG CAPS Take 1,000 mg by mouth daily.   insulin detemir (LEVEMIR) 100 UNIT/ML injection Inject 15 Units into the skin at bedtime. (Patient not taking: Reported on 08/15/2023)   iron polysaccharides (NIFEREX) 150 MG capsule Take 150 mg by mouth daily. (  Patient not taking: Reported on 05/16/2023)   pantoprazole (PROTONIX) 40 MG tablet Take 40 mg by mouth daily.   ascorbic acid (VITAMIN C) 500 MG tablet Take 500 mg by mouth daily.   donepezil (ARICEPT) 5 MG tablet Take 1 tablet (5 mg total) by mouth at bedtime.   QUEtiapine (SEROQUEL) 100 MG tablet Take 100 mg by mouth at bedtime.   alendronate (FOSAMAX) 70 MG tablet Take 70 mg by mouth once a week. Take with a full glass of water on an empty stomach.   escitalopram (LEXAPRO) 10 MG tablet Take 10 mg by mouth daily.   gabapentin (NEURONTIN) 400 MG capsule Take 400 mg by mouth 2 (two) times daily.    OZEMPIC, 0.25 OR 0.5 MG/DOSE, 2 MG/3ML SOPN Inject 0.5 mg into the skin once a week.   valsartan (DIOVAN) 40 MG tablet TAKE 1 TABLET EVERY DAY   clopidogrel (PLAVIX) 75 MG tablet TAKE ONE TABLET BY MOUTH EVERY DAY   ranolazine (RANEXA) 1000 MG SR tablet TAKE 1 TABLET TWICE DAILY   amLODipine (NORVASC) 10 MG tablet Take 1 tablet by mouth daily.   metFORMIN (GLUCOPHAGE-XR) 500 MG 24 hr tablet SMARTSIG:2.0 Tablet(s) By Mouth Twice Daily   insulin glargine (LANTUS) 100 UNIT/ML injection Inject 11 Units into the skin daily.   carvedilol (COREG) 3.125 MG tablet Take 1 tablet (3.125 mg total) by mouth 2 (two) times daily.    ALLERGIES  Allergies  Allergen Reactions   Penicillins Anaphylaxis    Has patient had a PCN reaction causing immediate rash, facial/tongue/throat swelling, SOB or lightheadedness with hypotension: Yes Has patient had a PCN reaction causing severe rash involving mucus membranes or skin necrosis: No Has patient had a PCN reaction that required hospitalization Yes Has patient had a PCN reaction occurring within the last 10 years: No If all of the above answers are "NO", then may proceed with Cephalosporin use.   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   Social Drivers of Health Y/N   Financial Resource Strain: Y  Food Insecurity: N  Transportation Needs: N  Physical Activity: N  Stress: Y  Social Connections: N  Intimate Partner Violence: N  Housing Stability: N      Denied alcohol or drug use    UDS positive for: tricyclic        FAMILY HISTORY   Family Psychiatric History (if known):  denied psychiatric illnesses, father alcohol abuse; denied suicides  MENTAL STATUS EXAM (MSE)  Mental Status Exam: General Appearance: Disheveled  Orientation:   Full (Time, Place, and Person)  Memory:  Immediate;   Fair Recent;   Fair Remote;   Fair  Concentration:  Concentration: Fair  Recall:  Fair  Attention  Good  Eye Contact:  Good  Speech:  Clear and Coherent  Language:  Good  Volume:  Normal  Mood: "feel good now"  Affect:  Appropriate  Thought Process:  Goal Directed  Thought Content:  Logical  Suicidal Thoughts:  No  Homicidal Thoughts:  No  Judgement:  Fair  Insight:  Fair  Psychomotor Activity:  Normal  Akathisia:  No  Fund of Knowledge:  Fair    Assets:  Manufacturing systems engineer Desire for Improvement Housing Social Support  Cognition:  WNL  ADL's:  Intact  AIMS (if indicated):       VITALS  Blood pressure (!) 169/69, pulse 63, temperature 98 F (36.7 C), temperature source Oral, resp. rate 16, SpO2 100%.  LABS  Admission on 10/02/2023  Component Date Value Ref Range Status   Acetaminophen (Tylenol), Serum 10/02/2023 <10 (L)  10 - 30 ug/mL Final   Comment: (NOTE) Therapeutic concentrations vary significantly. A range of 10-30 ug/mL  may be an effective concentration for many patients. However, some  are best treated at concentrations outside of this range. Acetaminophen concentrations >150 ug/mL at 4 hours after ingestion  and >50 ug/mL at 12 hours after ingestion are often associated with  toxic reactions.  Performed at [facility], [address] [place], [place] [address]    WBC 10/02/2023 8.4  4.0 - 10.5 K/uL Final   RBC 10/02/2023 4.26  4.22 - 5.81 MIL/uL Final   Hemoglobin 10/02/2023 13.9  13.0 - 17.0 g/dL Final   HCT 56/21/3086 41.1  39.0 - 52.0 % Final   MCV 10/02/2023 96.5  80.0 - 100.0 fL Final   MCH 10/02/2023 32.6  26.0 - 34.0 pg Final   MCHC 10/02/2023 33.8  30.0 - 36.0 g/dL Final   RDW 57/84/6962 13.3  11.5 - 15.5 % Final   Platelets 10/02/2023 297  150 - 400 K/uL Final   nRBC 10/02/2023 0.0  0.0 - 0.2 % Final   Neutrophils Relative % 10/02/2023 70  % Final   Neutro Abs 10/02/2023  5.9  1.7 - 7.7 K/uL Final   Lymphocytes Relative 10/02/2023 18  % Final   Lymphs Abs 10/02/2023 1.5  0.7 - 4.0 K/uL Final   Monocytes Relative 10/02/2023 9  % Final   Monocytes Absolute 10/02/2023 0.7  0.1 - 1.0 K/uL Final   Eosinophils Relative 10/02/2023 2  % Final   Eosinophils Absolute 10/02/2023 0.2  0.0 - 0.5 K/uL Final   Basophils Relative 10/02/2023 0  % Final   Basophils Absolute 10/02/2023 0.0  0.0 - 0.1 K/uL Final   Immature Granulocytes 10/02/2023 1  % Final   Abs Immature Granulocytes 10/02/2023 0.04  0.00 - 0.07 K/uL Final   Performed at [facility], [address] [place], [place] [address]   Color, Urine 10/02/2023 YELLOW (A)  YELLOW Final   APPearance 10/02/2023 CLEAR (A)  CLEAR Final   Specific Gravity, Urine 10/02/2023 1.019  1.005 - 1.030 Final   pH 10/02/2023 7.0  5.0 - 8.0 Final   Glucose, UA 10/02/2023 NEGATIVE  NEGATIVE mg/dL Final   Hgb urine dipstick 10/02/2023 NEGATIVE  NEGATIVE Final   Bilirubin Urine 10/02/2023 NEGATIVE  NEGATIVE Final   Ketones, ur 10/02/2023 NEGATIVE  NEGATIVE mg/dL Final   Protein, ur 13/24/4010 30 (A)  NEGATIVE mg/dL Final   Nitrite 27/25/3664 NEGATIVE  NEGATIVE Final   Leukocytes,Ua 10/02/2023 NEGATIVE  NEGATIVE Final   RBC / HPF 10/02/2023 0-5  0 - 5 RBC/hpf Final   WBC, UA 10/02/2023 0-5  0 - 5 WBC/hpf Final   Bacteria, UA 10/02/2023 RARE (A)  NONE SEEN Final   Squamous Epithelial / HPF 10/02/2023 0-5  0 - 5 /HPF Final   Performed at [facility], [address] [place], [place] [address]   SARS Coronavirus 2 by RT PCR 10/02/2023 NEGATIVE  NEGATIVE Final   Comment: (NOTE) SARS-CoV-2 target nucleic acids are NOT DETECTED.  The SARS-CoV-2 RNA is generally detectable in upper respiratory specimens during the acute phase of  infection. The lowest concentration of SARS-CoV-2 viral copies this assay can detect is 138 copies/mL. A negative result does not preclude SARS-Cov-2 infection and should not be used as the  sole basis for treatment or other patient management decisions. A negative result may occur with  improper specimen collection/handling, submission of specimen other than nasopharyngeal swab, presence of viral mutation(s) within the areas targeted by this assay, and inadequate number of viral copies(<138 copies/mL). A negative result must be combined with clinical observations, patient history, and epidemiological information. The expected result is Negative.  Fact Sheet for Patients:  BloggerCourse.com  Fact Sheet for Healthcare Providers:  SeriousBroker.it  This test is no                          t yet approved or cleared by the [place] FDA and  has been authorized for detection and/or diagnosis of SARS-CoV-2 by FDA under an Emergency Use Authorization (EUA). This EUA will remain  in effect (meaning this test can be used) for the duration of the COVID-19 declaration under Section 564(b)(1) of the Act, 21 U.S.C.section 360bbb-3(b)(1), unless the authorization is terminated  or revoked sooner.       Influenza A by PCR 10/02/2023 NEGATIVE  NEGATIVE Final   Influenza B by PCR 10/02/2023 NEGATIVE  NEGATIVE Final   Comment: (NOTE) The Xpert Xpress SARS-CoV-2/FLU/RSV plus assay is intended as an aid in the diagnosis of influenza from Nasopharyngeal swab specimens and should not be used as a sole basis for treatment. Nasal washings and aspirates are unacceptable for Xpert Xpress SARS-CoV-2/FLU/RSV testing.  Fact Sheet for Patients: BloggerCourse.com  Fact Sheet for Healthcare Providers: SeriousBroker.it  This test is not yet approved or cleared by the [place] FDA and has been authorized for detection and/or diagnosis of SARS-CoV-2 by FDA under an Emergency Use Authorization (EUA). This EUA will remain in effect (meaning this test can be used) for the duration of  the COVID-19 declaration under Section 564(b)(1) of the Act, 21 U.S.C. section 360bbb-3(b)(1), unless the authorization is terminated or revoked.     Resp Syncytial Virus by PCR 10/02/2023 NEGATIVE  NEGATIVE Final   Comment: (NOTE) Fact Sheet for Patients: BloggerCourse.com  Fact Sheet for Healthcare Providers: SeriousBroker.it  This test is not yet approved or cleared by the [place] FDA and has been authorized for detection and/or diagnosis of SARS-CoV-2 by FDA under an Emergency Use Authorization (EUA). This EUA will remain in effect (meaning this test can be used) for the duration of the COVID-19 declaration under Section 564(b)(1) of the Act, 21 U.S.C. section 360bbb-3(b)(1), unless the authorization is terminated or revoked.  Performed at [facility], [address] [place], [place] [address]    Sodium 10/02/2023 136  135 - 145 mmol/L Final   Potassium 10/02/2023 4.0  3.5 - 5.1 mmol/L Final   Chloride 10/02/2023 101  98 - 111 mmol/L Final   CO2 10/02/2023 22  22 - 32 mmol/L Final   Glucose, Bld 10/02/2023 212 (H)  70 - 99 mg/dL Final   Glucose reference range applies only to samples taken after fasting for at least 8 hours.   BUN 10/02/2023 24 (H)  8 - 23 mg/dL Final   Creatinine, Ser 10/02/2023 1.32 (H)  0.61 - 1.24 mg/dL Final   Calcium 21/30/8657 9.1  8.9 - 10.3 mg/dL Final   Total Protein 84/69/6295 7.6  6.5 - 8.1 g/dL Final   Albumin 28/41/3244 4.4  3.5 - 5.0 g/dL Final   AST 18/84/1660 28  15 - 41 U/L Final   ALT 10/02/2023 21  0 - 44 U/L Final   Alkaline Phosphatase 10/02/2023 72  38 - 126 U/L Final   Total Bilirubin 10/02/2023 1.1  0.0 - 1.2 mg/dL Final   GFR, Estimated 10/02/2023 55 (L)  >60 mL/min Final   Comment: (NOTE) Calculated using the CKD-EPI Creatinine Equation (2021)    Anion gap 10/02/2023 13  5 - 15 Final   Performed at [facility], [address] [place], [place]  [address]   Alcohol, Ethyl (B) 10/02/2023 <10  <10 mg/dL Final   Comment: (NOTE) Lowest detectable limit for serum alcohol is 10 mg/dL.  For medical purposes only. Performed at [facility], [address] [place], [place] [address]    Salicylate Lvl 10/02/2023 <7.0 (L)  7.0 - 30.0 mg/dL Final   Performed at [facility], [address] [place], [place] [address]   Troponin I (High Sensitivity) 10/02/2023 5  <18 ng/L Final   Comment: (NOTE) Elevated high sensitivity troponin I (hsTnI) values and significant  changes across serial measurements may suggest ACS but many other  chronic and acute conditions are known to elevate hsTnI results.  Refer to the "Links" section for chest pain algorithms and additional  guidance. Performed at [facility], [address] [place], [place] [address]    Tricyclic, Ur Screen 10/02/2023 POSITIVE (A)  NONE DETECTED Final   Amphetamines, Ur Screen 10/02/2023 NONE DETECTED  NONE DETECTED Final   MDMA (Ecstasy)Ur Screen 10/02/2023 NONE DETECTED  NONE DETECTED Final   Cocaine Metabolite,Ur [place] 10/02/2023 NONE DETECTED  NONE DETECTED Final   Opiate, Ur Screen 10/02/2023 NONE DETECTED  NONE DETECTED Final   Phencyclidine (PCP) Ur S 10/02/2023 NONE DETECTED  NONE DETECTED Final   Cannabinoid 50 Ng, Ur [place] 10/02/2023 NONE DETECTED  NONE DETECTED Final   Barbiturates, Ur Screen 10/02/2023 NONE DETECTED  NONE DETECTED Final   Benzodiazepine, Ur Scrn 10/02/2023 NONE DETECTED  NONE DETECTED Final   Methadone Scn, Ur 10/02/2023 NONE DETECTED  NONE DETECTED Final   Comment: (NOTE) Tricyclics + metabolites, urine    Cutoff 1000 ng/mL Amphetamines + metabolites, urine  Cutoff 1000 ng/mL MDMA (Ecstasy), urine              Cutoff 500 ng/mL Cocaine Metabolite, urine          Cutoff 300 ng/mL Opiate + metabolites, urine        Cutoff 300 ng/mL Phencyclidine (PCP), urine         Cutoff 25 ng/mL Cannabinoid, urine                 Cutoff 50  ng/mL Barbiturates + metabolites, urine  Cutoff 200 ng/mL Benzodiazepine, urine              Cutoff 200 ng/mL Methadone, urine                   Cutoff 300 ng/mL  The urine drug screen provides only a preliminary, unconfirmed analytical test result and should not be used for non-medical purposes. Clinical consideration and professional judgment should be applied to any positive drug screen result due to possible interfering substances. A more specific alternate chemical method must be used in order to obtain a confirmed analytical result. Gas chromatography / mass spectrometry (GC/MS) is the preferred confirm  atory method. Performed at [facility], [address] [place], [place] [address]    Glucose-Capillary 10/02/2023 236 (H)  70 - 99 mg/dL Final   Glucose reference range applies only to samples taken after fasting for at least 8 hours.    PSYCHIATRIC REVIEW OF SYSTEMS (ROS)  Depression:      []  Denies all symptoms of depression [x] Depressed mood       [x] Insomnia/hypersomnia              [x] Fatigue        [] Change in appetite     [] Anhedonia                                [x] Difficulty concentrating      [] Hopelessness             [] Worthlessness [] Guilt/shame                [] Psychomotor agitation/retardation   Mania:     [x] Denies all symptoms of mania [] Elevated mood           [] Irritability         [] Pressured speech         []  Grandiosity         []  Decreased need for sleep                                                 [] Increased energy          []  Increase in goal directed activity                                       [] Flight of ideas    []  Excessive involvement in high-risk behaviors                   []  Distractibility     Psychosis:     [x] Denies all symptoms of psychosis [] Paranoia         []  Auditory Hallucinations          [] Visual hallucinations         [] ELOC        [] IOR                [] Delusions   Suicide:    [x]   Denies SI/plan/intent []  Passive SI         []   Active SI         [] Plan           [] Intent   Homicide:  [x]   Denies HI/plan/intent []  Passive HI         []  Active HI         [] Plan            [] Intent           [] Identified Target    Additional findings:      Musculoskeletal: No abnormal movements observed      Gait & Station: Laying/Sitting      Pain Screening: Present - mild to moderate        RISK FORMULATION/ASSESSMENT  Is the patient experiencing any suicidal or homicidal ideations: No       Explain if  yes:  Protective factors considered for safety management:   Absence of psychosis Access to adequate health care Advice& help seeking Resourcefulness/Survival skills Children Sense of responsibility Positive social support Positive therapeutic relationship Future oriented Suicide Inquiry:  Denies suicidal ideations, intentions, or plans.  Denies  recent self-harm behavior. Talks futuristically.  Risk factors/concerns considered for safety management:  Depression Physical illness/chronic pain Access to lethal means Age over 5 Male gender  Is there a safety management plan with the patient and treatment team to minimize risk factors and promote protective factors: Yes           Explain: safety precautions  Is crisis care placement or psychiatric hospitalization recommended: No     Based on my current evaluation and risk assessment, patient is determined at this time to be at:  Low risk  *RISK ASSESSMENT Risk assessment is a dynamic process; it is possible that this patient's condition, and risk level, may change. This should be re-evaluated and managed over time as appropriate. Please re-consult psychiatric consult services if additional assistance is needed in terms of risk assessment and management. If your team decides to discharge this patient, please advise the patient how to best access emergency psychiatric services, or to call 911, if their condition worsens or  they feel unsafe in any way.  Total time spent in this encounter was 60 minutes with greater than 50% of time spent in counseling and coordination of care.     Dr. Olivia Mackie. Christell Constant, PhD, MSN, APRN, PMHNP-BC, [facility], NP Telepsychiatry Consult Services

## 2023-10-03 NOTE — ED Provider Notes (Signed)
Emergency Medicine Observation Re-evaluation Note  Patrick Turner is a 78 y.o. male, seen on rounds today.  Pt initially presented to the ED for complaints of Altered Mental Status Currently, the patient is resting, voices no medical complaints.  Physical Exam  BP (!) 160/74 (BP Location: Right Arm) Comment: pt just walked to the bathroom and back  Pulse 67   Temp 97.6 F (36.4 C)   Resp 16   SpO2 100%  Physical Exam General: Resting in NAD Cardiac: No cyanosis Lungs: Equal rise and fall Psych: Not agitated  ED Course / MDM  EKG:   I have reviewed the labs performed to date as well as medications administered while in observation.  Recent changes in the last 24 hours include no events overnight.  Plan  Current plan is for psychiatric disposition.    Irean Hong, MD 10/03/23 [phone]

## 2023-10-13 DIAGNOSIS — E113313 Type 2 diabetes mellitus with moderate nonproliferative diabetic retinopathy with macular edema, bilateral: Secondary | ICD-10-CM | POA: Diagnosis not present

## 2023-10-19 DIAGNOSIS — E1142 Type 2 diabetes mellitus with diabetic polyneuropathy: Secondary | ICD-10-CM | POA: Diagnosis not present

## 2023-10-19 DIAGNOSIS — I779 Disorder of arteries and arterioles, unspecified: Secondary | ICD-10-CM | POA: Diagnosis not present

## 2023-10-20 DIAGNOSIS — F418 Other specified anxiety disorders: Secondary | ICD-10-CM | POA: Diagnosis not present

## 2023-10-20 DIAGNOSIS — F02A2 Dementia in other diseases classified elsewhere, mild, with psychotic disturbance: Secondary | ICD-10-CM | POA: Diagnosis not present

## 2023-10-20 DIAGNOSIS — F332 Major depressive disorder, recurrent severe without psychotic features: Secondary | ICD-10-CM | POA: Diagnosis not present

## 2023-10-26 DIAGNOSIS — M2042 Other hammer toe(s) (acquired), left foot: Secondary | ICD-10-CM | POA: Diagnosis not present

## 2023-10-26 DIAGNOSIS — R600 Localized edema: Secondary | ICD-10-CM | POA: Diagnosis not present

## 2023-10-26 DIAGNOSIS — Z794 Long term (current) use of insulin: Secondary | ICD-10-CM | POA: Diagnosis not present

## 2023-10-26 DIAGNOSIS — B351 Tinea unguium: Secondary | ICD-10-CM | POA: Diagnosis not present

## 2023-10-26 DIAGNOSIS — M79675 Pain in left toe(s): Secondary | ICD-10-CM | POA: Diagnosis not present

## 2023-10-26 DIAGNOSIS — E1142 Type 2 diabetes mellitus with diabetic polyneuropathy: Secondary | ICD-10-CM | POA: Diagnosis not present

## 2023-10-26 DIAGNOSIS — M79674 Pain in right toe(s): Secondary | ICD-10-CM | POA: Diagnosis not present

## 2023-10-27 DIAGNOSIS — N183 Chronic kidney disease, stage 3 unspecified: Secondary | ICD-10-CM | POA: Diagnosis not present

## 2023-10-27 DIAGNOSIS — I25118 Atherosclerotic heart disease of native coronary artery with other forms of angina pectoris: Secondary | ICD-10-CM | POA: Diagnosis not present

## 2023-10-27 DIAGNOSIS — E1122 Type 2 diabetes mellitus with diabetic chronic kidney disease: Secondary | ICD-10-CM | POA: Diagnosis not present

## 2023-10-27 DIAGNOSIS — Z0000 Encounter for general adult medical examination without abnormal findings: Secondary | ICD-10-CM | POA: Diagnosis not present

## 2023-10-27 DIAGNOSIS — F339 Major depressive disorder, recurrent, unspecified: Secondary | ICD-10-CM | POA: Diagnosis not present

## 2023-10-27 DIAGNOSIS — E1169 Type 2 diabetes mellitus with other specified complication: Secondary | ICD-10-CM | POA: Diagnosis not present

## 2023-10-27 DIAGNOSIS — I779 Disorder of arteries and arterioles, unspecified: Secondary | ICD-10-CM | POA: Diagnosis not present

## 2023-10-27 DIAGNOSIS — I129 Hypertensive chronic kidney disease with stage 1 through stage 4 chronic kidney disease, or unspecified chronic kidney disease: Secondary | ICD-10-CM | POA: Diagnosis not present

## 2023-10-27 DIAGNOSIS — E1142 Type 2 diabetes mellitus with diabetic polyneuropathy: Secondary | ICD-10-CM | POA: Diagnosis not present

## 2023-10-30 ENCOUNTER — Other Ambulatory Visit: Payer: Self-pay

## 2023-10-30 DIAGNOSIS — Z961 Presence of intraocular lens: Secondary | ICD-10-CM | POA: Diagnosis not present

## 2023-10-30 DIAGNOSIS — I6523 Occlusion and stenosis of bilateral carotid arteries: Secondary | ICD-10-CM

## 2023-10-30 DIAGNOSIS — I2510 Atherosclerotic heart disease of native coronary artery without angina pectoris: Secondary | ICD-10-CM

## 2023-10-30 DIAGNOSIS — H43813 Vitreous degeneration, bilateral: Secondary | ICD-10-CM | POA: Diagnosis not present

## 2023-10-30 DIAGNOSIS — G4733 Obstructive sleep apnea (adult) (pediatric): Secondary | ICD-10-CM

## 2023-10-30 DIAGNOSIS — I10 Essential (primary) hypertension: Secondary | ICD-10-CM

## 2023-10-30 DIAGNOSIS — E113313 Type 2 diabetes mellitus with moderate nonproliferative diabetic retinopathy with macular edema, bilateral: Secondary | ICD-10-CM | POA: Diagnosis not present

## 2023-10-30 MED ORDER — CARVEDILOL 3.125 MG PO TABS: 3.1250 mg | ORAL_TABLET | Freq: Two times a day (BID) | ORAL | 11 refills | Status: DC

## 2023-10-31 DIAGNOSIS — M810 Age-related osteoporosis without current pathological fracture: Secondary | ICD-10-CM | POA: Diagnosis not present

## 2023-11-07 DIAGNOSIS — Z8781 Personal history of (healed) traumatic fracture: Secondary | ICD-10-CM | POA: Diagnosis not present

## 2023-11-07 DIAGNOSIS — M810 Age-related osteoporosis without current pathological fracture: Secondary | ICD-10-CM | POA: Diagnosis not present

## 2023-11-07 DIAGNOSIS — E1159 Type 2 diabetes mellitus with other circulatory complications: Secondary | ICD-10-CM | POA: Diagnosis not present

## 2023-11-07 DIAGNOSIS — E1142 Type 2 diabetes mellitus with diabetic polyneuropathy: Secondary | ICD-10-CM | POA: Diagnosis not present

## 2023-11-07 DIAGNOSIS — E11649 Type 2 diabetes mellitus with hypoglycemia without coma: Secondary | ICD-10-CM | POA: Diagnosis not present

## 2023-11-07 DIAGNOSIS — I10 Essential (primary) hypertension: Secondary | ICD-10-CM | POA: Diagnosis not present

## 2023-11-07 DIAGNOSIS — E559 Vitamin D deficiency, unspecified: Secondary | ICD-10-CM | POA: Diagnosis not present

## 2023-11-07 DIAGNOSIS — E113393 Type 2 diabetes mellitus with moderate nonproliferative diabetic retinopathy without macular edema, bilateral: Secondary | ICD-10-CM | POA: Diagnosis not present

## 2023-11-07 DIAGNOSIS — E1122 Type 2 diabetes mellitus with diabetic chronic kidney disease: Secondary | ICD-10-CM | POA: Diagnosis not present

## 2023-11-13 ENCOUNTER — Ambulatory Visit: Payer: Medicare HMO | Admitting: Cardiovascular Disease

## 2023-11-15 DIAGNOSIS — E1142 Type 2 diabetes mellitus with diabetic polyneuropathy: Secondary | ICD-10-CM | POA: Diagnosis not present

## 2023-11-23 ENCOUNTER — Encounter: Payer: Self-pay | Admitting: Cardiovascular Disease

## 2023-11-23 ENCOUNTER — Ambulatory Visit: Payer: Medicare HMO | Admitting: Cardiovascular Disease

## 2023-11-23 VITALS — BP 154/70 | HR 74 | Ht 64.0 in | Wt 173.0 lb

## 2023-11-23 DIAGNOSIS — I2583 Coronary atherosclerosis due to lipid rich plaque: Secondary | ICD-10-CM | POA: Diagnosis not present

## 2023-11-23 DIAGNOSIS — I2510 Atherosclerotic heart disease of native coronary artery without angina pectoris: Secondary | ICD-10-CM | POA: Diagnosis not present

## 2023-11-23 DIAGNOSIS — I10 Essential (primary) hypertension: Secondary | ICD-10-CM

## 2023-11-23 DIAGNOSIS — R001 Bradycardia, unspecified: Secondary | ICD-10-CM

## 2023-11-23 DIAGNOSIS — I6523 Occlusion and stenosis of bilateral carotid arteries: Secondary | ICD-10-CM | POA: Diagnosis not present

## 2023-11-23 DIAGNOSIS — G4733 Obstructive sleep apnea (adult) (pediatric): Secondary | ICD-10-CM | POA: Diagnosis not present

## 2023-11-23 MED ORDER — VALSARTAN 80 MG PO TABS: 80.0000 mg | ORAL_TABLET | Freq: Every day | ORAL | 3 refills | Status: DC

## 2023-11-23 NOTE — Progress Notes (Signed)
 Cardiology Office Note   Date:  11/23/2023   ID:  DUPREE GIVLER, DOB [birth year], MRN [id number]  PCP:  Lauro Regulus, MD  Cardiologist:  Adrian Blackwater, MD      History of Present Illness: Patrick Turner is a 78 y.o. male who presents for  Chief Complaint  Patient presents with   Follow-up    3 Months Follow Up    Doing well      Past Medical History:  Diagnosis Date   Acute on chronic renal failure (HCC) 03/23/2019   Acute on chronic renal insufficiency 05/30/2016   Altered mental status    Anemia    Anxiety    Aortic atherosclerosis (HCC)    Arthritis    Coronary artery disease    CRD (chronic renal disease)    Depression    Diabetes mellitus without complication (HCC)    Encephalopathy acute    GERD (gastroesophageal reflux disease)    Headache    Hypercholesteremia    Hypertension    Neuropathy    Severe obesity (HCC)    Sleep apnea    Sleep terror    per patient this year per patient      Past Surgical History:  Procedure Laterality Date   APPENDECTOMY     CIRCUMCISION     COLONOSCOPY     COLONOSCOPY WITH PROPOFOL N/A 07/10/2018   Procedure: COLONOSCOPY WITH PROPOFOL;  Surgeon: Christena Deem, MD;  Location: [facility] ENDOSCOPY;  Service: Endoscopy;  Laterality: N/A;   COLONOSCOPY WITH PROPOFOL N/A 04/23/2019   Procedure: COLONOSCOPY WITH PROPOFOL;  Surgeon: Christena Deem, MD;  Location: [facility] ENDOSCOPY;  Service: Endoscopy;  Laterality: N/A;   CORONARY STENT INTERVENTION N/A 10/03/2019   Procedure: CORONARY STENT INTERVENTION;  Surgeon: Alwyn Pea, MD;  Location: [facility] INVASIVE CV LAB;  Service: Cardiovascular;  Laterality: N/A;   ESOPHAGOGASTRODUODENOSCOPY     ESOPHAGOGASTRODUODENOSCOPY (EGD) WITH PROPOFOL N/A 04/23/2019   Procedure: ESOPHAGOGASTRODUODENOSCOPY (EGD) WITH PROPOFOL;  Surgeon: Christena Deem, MD;  Location: [facility] ENDOSCOPY;  Service: Endoscopy;  Laterality: N/A;   INTRAMEDULLARY (IM) NAIL INTERTROCHANTERIC  Left 09/17/2021   Procedure: INTRAMEDULLARY (IM) NAIL INTERTROCHANTRIC;  Surgeon: Joen Laura, MD;  Location: [facility] OR;  Service: Orthopedics;  Laterality: Left;   JOINT REPLACEMENT     KNEE ARTHROPLASTY Right 06/27/2016   Procedure: COMPUTER ASSISTED TOTAL KNEE ARTHROPLASTY;  Surgeon: Donato Heinz, MD;  Location: [facility] ORS;  Service: Orthopedics;  Laterality: Right;   KNEE ARTHROSCOPY Right    LEFT HEART CATH AND CORONARY ANGIOGRAPHY N/A 10/03/2019   Procedure: Left Heart Cath and possible Coronary intervention;  Surgeon: Laurier Nancy, MD;  Location: [facility] INVASIVE CV LAB;  Service: Cardiovascular;  Laterality: N/A;   TONSILLECTOMY     VISCERAL ANGIOGRAPHY N/A 05/23/2019   Procedure: VISCERAL ANGIOGRAPHY;  Surgeon: Annice Needy, MD;  Location: [facility] INVASIVE CV LAB;  Service: Cardiovascular;  Laterality: N/A;   VISCERAL ANGIOGRAPHY N/A 11/04/2019   Procedure: VISCERAL ANGIOGRAPHY;  Surgeon: Annice Needy, MD;  Location: [facility] INVASIVE CV LAB;  Service: Cardiovascular;  Laterality: N/A;   VISCERAL ANGIOGRAPHY N/A 12/14/2021   Procedure: VISCERAL ANGIOGRAPHY;  Surgeon: Annice Needy, MD;  Location: [facility] INVASIVE CV LAB;  Service: Cardiovascular;  Laterality: N/A;     Current Outpatient Medications  Medication Sig Dispense Refill   valsartan (DIOVAN) 80 MG tablet Take 1 tablet (80 mg total) by mouth daily. 30 tablet 3   alendronate (FOSAMAX) 70 MG tablet Take  70 mg by mouth once a week. Take with a full glass of water on an empty stomach.     amLODipine (NORVASC) 10 MG tablet Take 1 tablet by mouth daily.     ascorbic acid (VITAMIN C) 500 MG tablet Take 500 mg by mouth daily.     aspirin 81 MG tablet Take 1 tablet (81 mg total) by mouth daily.     atorvastatin (LIPITOR) 80 MG tablet Take 80 mg by mouth daily.     carvedilol (COREG) 3.125 MG tablet Take 1 tablet (3.125 mg total) by mouth 2 (two) times daily. 60 tablet 11   clopidogrel (PLAVIX) 75 MG tablet TAKE ONE TABLET BY MOUTH EVERY DAY 90  tablet 1   donepezil (ARICEPT) 5 MG tablet Take 1 tablet (5 mg total) by mouth at bedtime. 30 tablet 0   escitalopram (LEXAPRO) 10 MG tablet Take 10 mg by mouth daily.     gabapentin (NEURONTIN) 400 MG capsule Take 400 mg by mouth 2 (two) times daily.     insulin glargine (LANTUS) 100 UNIT/ML injection Inject 11 Units into the skin daily.     iron polysaccharides (NIFEREX) 150 MG capsule Take 150 mg by mouth daily. (Patient not taking: Reported on 05/16/2023)     metFORMIN (GLUCOPHAGE-XR) 500 MG 24 hr tablet SMARTSIG:2.0 Tablet(s) By Mouth Twice Daily     Omega-3 Fatty Acids (FISH OIL) 1000 MG CAPS Take 1,000 mg by mouth daily.     OZEMPIC, 0.25 OR 0.5 MG/DOSE, 2 MG/3ML SOPN Inject 0.5 mg into the skin once a week.     OZEMPIC, 1 MG/DOSE, 4 MG/3ML SOPN Inject 0.5 mg into the skin once a week.     pantoprazole (PROTONIX) 40 MG tablet Take 40 mg by mouth daily.     QUEtiapine (SEROQUEL) 100 MG tablet Take 100 mg by mouth at bedtime.     QUEtiapine (SEROQUEL) 25 MG tablet Take 1 tablet (25 mg total) by mouth in the morning. 30 tablet 2   ranolazine (RANEXA) 1000 MG SR tablet TAKE 1 TABLET TWICE DAILY 180 tablet 3   vitamin B-12 (CYANOCOBALAMIN) 1000 MCG tablet Take 1,000 mcg by mouth daily.     No current facility-administered medications for this visit.    Allergies:   Penicillins and Tavist [clemastine]    Social History:   reports that he has never smoked. He has never used smokeless tobacco. He reports current alcohol use. He reports that he does not use drugs.   Family History:  family history includes COPD in his father; Cancer in his father; Diabetes in his mother; Heart disease in his mother.    ROS:     Review of Systems  Constitutional: Negative.   HENT: Negative.    Eyes: Negative.   Respiratory: Negative.    Gastrointestinal: Negative.   Genitourinary: Negative.   Musculoskeletal: Negative.   Skin: Negative.   Neurological: Negative.   Endo/Heme/Allergies: Negative.    Psychiatric/Behavioral: Negative.    All other systems reviewed and are negative.     All other systems are reviewed and negative.    PHYSICAL EXAM: VS:  BP (!) 154/70   Pulse 74   Ht 5\' 4"  (1.626 m)   Wt 173 lb (78.5 kg)   SpO2 96%   BMI 29.70 kg/m  , BMI Body mass index is 29.7 kg/m. Last weight:  Wt Readings from Last 3 Encounters:  11/23/23 173 lb (78.5 kg)  09/26/23 166 lb (75.3 kg)  08/15/23 177 lb  3.2 oz (80.4 kg)     Physical Exam Vitals reviewed.  Constitutional:      Appearance: Normal appearance. He is normal weight.  HENT:     Head: Normocephalic.     Nose: Nose normal.     Mouth/Throat:     Mouth: Mucous membranes are moist.  Eyes:     Pupils: Pupils are equal, round, and reactive to light.  Cardiovascular:     Rate and Rhythm: Normal rate and regular rhythm.     Pulses: Normal pulses.     Heart sounds: Normal heart sounds.  Pulmonary:     Effort: Pulmonary effort is normal.  Abdominal:     General: Abdomen is flat. Bowel sounds are normal.  Musculoskeletal:        General: Normal range of motion.     Cervical back: Normal range of motion.  Skin:    General: Skin is warm.  Neurological:     General: No focal deficit present.     Mental Status: He is alert.  Psychiatric:        Mood and Affect: Mood normal.       EKG:   Recent Labs: 02/08/2023: Magnesium 2.1 10/02/2023: ALT 21; BUN 24; Creatinine, Ser 1.32; Hemoglobin 13.9; Platelets 297; Potassium 4.0; Sodium 136    Lipid Panel    Component Value Date/Time   CHOL 88 02/08/2023 0430   TRIG 99 02/08/2023 0430   HDL 40 (L) 02/08/2023 0430   CHOLHDL 2.2 02/08/2023 0430   VLDL 20 02/08/2023 0430   LDLCALC 28 02/08/2023 0430      Other studies Reviewed: Additional studies/ records that were reviewed today include:  Review of the above records demonstrates:       No data to display            ASSESSMENT AND PLAN:    ICD-10-CM   1. Bilateral carotid artery stenosis   I65.23     2. Coronary artery disease due to lipid rich plaque  I25.10    I25.83     3. HTN (hypertension), benign  I10    Blood pressure is elevated to 170/75 on recheck thus will increase Diovan to 80 mg p.o. once a day.  Patient is working third shift , is why blood pressure up    4. OSA (obstructive sleep apnea)  G47.33     5. Sinus bradycardia  R00.1    Bradycardia resolved with heart rate in the 70s now.  Patient is currently now on carvedilol 3.125 p.o. twice daily.  However blood pressure has increased.       Problem List Items Addressed This Visit       Cardiovascular and Mediastinum   Bilateral carotid artery disease (HCC) - Primary   Relevant Medications   valsartan (DIOVAN) 80 MG tablet   HTN (hypertension), benign   Relevant Medications   valsartan (DIOVAN) 80 MG tablet   CAD (coronary artery disease)   Relevant Medications   valsartan (DIOVAN) 80 MG tablet   Other Visit Diagnoses       OSA (obstructive sleep apnea)         Sinus bradycardia       Bradycardia resolved with heart rate in the 70s now.  Patient is currently now on carvedilol 3.125 p.o. twice daily.  However blood pressure has increased.   Relevant Medications   valsartan (DIOVAN) 80 MG tablet          Disposition:   Return in about 3  months (around 02/23/2024).    Total time spent: 30 minutes  Signed,  Adrian Blackwater, MD  11/23/2023 9:49 AM    [facility]

## 2023-11-24 ENCOUNTER — Other Ambulatory Visit (INDEPENDENT_AMBULATORY_CARE_PROVIDER_SITE_OTHER): Payer: Self-pay | Admitting: Vascular Surgery

## 2023-11-24 DIAGNOSIS — K551 Chronic vascular disorders of intestine: Secondary | ICD-10-CM

## 2023-11-24 DIAGNOSIS — I6523 Occlusion and stenosis of bilateral carotid arteries: Secondary | ICD-10-CM

## 2023-11-28 ENCOUNTER — Encounter (INDEPENDENT_AMBULATORY_CARE_PROVIDER_SITE_OTHER): Payer: Self-pay | Admitting: Vascular Surgery

## 2023-11-28 ENCOUNTER — Ambulatory Visit (INDEPENDENT_AMBULATORY_CARE_PROVIDER_SITE_OTHER): Payer: Medicare HMO | Admitting: Vascular Surgery

## 2023-11-28 ENCOUNTER — Ambulatory Visit (INDEPENDENT_AMBULATORY_CARE_PROVIDER_SITE_OTHER): Payer: Medicare HMO

## 2023-11-28 VITALS — BP 181/71 | HR 69 | Resp 18 | Ht 64.0 in | Wt 171.6 lb

## 2023-11-28 DIAGNOSIS — I6523 Occlusion and stenosis of bilateral carotid arteries: Secondary | ICD-10-CM | POA: Diagnosis not present

## 2023-11-28 DIAGNOSIS — I10 Essential (primary) hypertension: Secondary | ICD-10-CM | POA: Diagnosis not present

## 2023-11-28 DIAGNOSIS — K551 Chronic vascular disorders of intestine: Secondary | ICD-10-CM

## 2023-11-28 DIAGNOSIS — E1159 Type 2 diabetes mellitus with other circulatory complications: Secondary | ICD-10-CM | POA: Diagnosis not present

## 2023-11-28 NOTE — Progress Notes (Signed)
 MRN : [id number]  Patrick Turner is a 78 y.o. (08/05/1946) male who presents with chief complaint of  Chief Complaint  Patient presents with   Follow-up    f/u in 2 year with carotid  .  History of Present Illness: Patient returns today in follow up of multiple vascular issues.  He has undergone intervention for both his celiac and mesenteric arteries in the past.  The last intervention was about 2 years ago and was an angioplasty of the SMA for previous stent placement.  Since that time, he has done much better with less abdominal pain and much more ability to eat and maintain weight.  Duplex today showed normal velocities in the celiac artery and mildly elevated velocities in the SMA with velocities just above the upper limits of normal but improved from previous. He is also followed for carotid disease.  No current complaints relative to his carotid disease.  He denies any focal neurologic symptoms. Specifically, the patient denies amaurosis fugax, speech or swallowing difficulties, or arm or leg weakness or numbness. Duplex today shows near normal findings with only minimal wall thickening and plaque and no significant stenosis in either carotid artery.    Current Outpatient Medications  Medication Sig Dispense Refill   alendronate (FOSAMAX) 70 MG tablet Take 70 mg by mouth once a week. Take with a full glass of water on an empty stomach.     amLODipine (NORVASC) 10 MG tablet Take 1 tablet by mouth daily.     ascorbic acid (VITAMIN C) 500 MG tablet Take 500 mg by mouth daily.     aspirin 81 MG tablet Take 1 tablet (81 mg total) by mouth daily.     atorvastatin (LIPITOR) 80 MG tablet Take 80 mg by mouth daily.     carvedilol (COREG) 3.125 MG tablet Take 1 tablet (3.125 mg total) by mouth 2 (two) times daily. 60 tablet 11   clopidogrel (PLAVIX) 75 MG tablet TAKE ONE TABLET BY MOUTH EVERY DAY 90 tablet 1   donepezil (ARICEPT) 5 MG tablet Take 1 tablet (5 mg total) by mouth at bedtime. 30  tablet 0   escitalopram (LEXAPRO) 10 MG tablet Take 10 mg by mouth daily.     gabapentin (NEURONTIN) 400 MG capsule Take 400 mg by mouth 2 (two) times daily.     insulin glargine (LANTUS) 100 UNIT/ML injection Inject 11 Units into the skin daily.     metFORMIN (GLUCOPHAGE-XR) 500 MG 24 hr tablet SMARTSIG:2.0 Tablet(s) By Mouth Twice Daily     Omega-3 Fatty Acids (FISH OIL) 1000 MG CAPS Take 1,000 mg by mouth daily.     OZEMPIC, 0.25 OR 0.5 MG/DOSE, 2 MG/3ML SOPN Inject 0.5 mg into the skin once a week.     OZEMPIC, 1 MG/DOSE, 4 MG/3ML SOPN Inject 0.5 mg into the skin once a week.     pantoprazole (PROTONIX) 40 MG tablet Take 40 mg by mouth daily.     QUEtiapine (SEROQUEL) 100 MG tablet Take 100 mg by mouth at bedtime.     QUEtiapine (SEROQUEL) 25 MG tablet Take 1 tablet (25 mg total) by mouth in the morning. 30 tablet 2   ranolazine (RANEXA) 1000 MG SR tablet TAKE 1 TABLET TWICE DAILY 180 tablet 3   valsartan (DIOVAN) 80 MG tablet Take 1 tablet (80 mg total) by mouth daily. 30 tablet 3   vitamin B-12 (CYANOCOBALAMIN) 1000 MCG tablet Take 1,000 mcg by mouth daily.     iron polysaccharides (  NIFEREX) 150 MG capsule Take 150 mg by mouth daily. (Patient not taking: Reported on 05/16/2023)     No current facility-administered medications for this visit.    Past Medical History:  Diagnosis Date   Acute on chronic renal failure (HCC) 03/23/2019   Acute on chronic renal insufficiency 05/30/2016   Altered mental status    Anemia    Anxiety    Aortic atherosclerosis (HCC)    Arthritis    Coronary artery disease    CRD (chronic renal disease)    Depression    Diabetes mellitus without complication (HCC)    Encephalopathy acute    GERD (gastroesophageal reflux disease)    Headache    Hypercholesteremia    Hypertension    Neuropathy    Severe obesity (HCC)    Sleep apnea    Sleep terror    per patient this year per patient     Past Surgical History:  Procedure Laterality Date    APPENDECTOMY     CIRCUMCISION     COLONOSCOPY     COLONOSCOPY WITH PROPOFOL N/A 07/10/2018   Procedure: COLONOSCOPY WITH PROPOFOL;  Surgeon: Christena Deem, MD;  Location: [facility] ENDOSCOPY;  Service: Endoscopy;  Laterality: N/A;   COLONOSCOPY WITH PROPOFOL N/A 04/23/2019   Procedure: COLONOSCOPY WITH PROPOFOL;  Surgeon: Christena Deem, MD;  Location: [facility] ENDOSCOPY;  Service: Endoscopy;  Laterality: N/A;   CORONARY STENT INTERVENTION N/A 10/03/2019   Procedure: CORONARY STENT INTERVENTION;  Surgeon: Alwyn Pea, MD;  Location: [facility] INVASIVE CV LAB;  Service: Cardiovascular;  Laterality: N/A;   ESOPHAGOGASTRODUODENOSCOPY     ESOPHAGOGASTRODUODENOSCOPY (EGD) WITH PROPOFOL N/A 04/23/2019   Procedure: ESOPHAGOGASTRODUODENOSCOPY (EGD) WITH PROPOFOL;  Surgeon: Christena Deem, MD;  Location: [facility] ENDOSCOPY;  Service: Endoscopy;  Laterality: N/A;   INTRAMEDULLARY (IM) NAIL INTERTROCHANTERIC Left 09/17/2021   Procedure: INTRAMEDULLARY (IM) NAIL INTERTROCHANTRIC;  Surgeon: Joen Laura, MD;  Location: [facility] OR;  Service: Orthopedics;  Laterality: Left;   JOINT REPLACEMENT     KNEE ARTHROPLASTY Right 06/27/2016   Procedure: COMPUTER ASSISTED TOTAL KNEE ARTHROPLASTY;  Surgeon: Donato Heinz, MD;  Location: [facility] ORS;  Service: Orthopedics;  Laterality: Right;   KNEE ARTHROSCOPY Right    LEFT HEART CATH AND CORONARY ANGIOGRAPHY N/A 10/03/2019   Procedure: Left Heart Cath and possible Coronary intervention;  Surgeon: Laurier Nancy, MD;  Location: [facility] INVASIVE CV LAB;  Service: Cardiovascular;  Laterality: N/A;   TONSILLECTOMY     VISCERAL ANGIOGRAPHY N/A 05/23/2019   Procedure: VISCERAL ANGIOGRAPHY;  Surgeon: Annice Needy, MD;  Location: [facility] INVASIVE CV LAB;  Service: Cardiovascular;  Laterality: N/A;   VISCERAL ANGIOGRAPHY N/A 11/04/2019   Procedure: VISCERAL ANGIOGRAPHY;  Surgeon: Annice Needy, MD;  Location: [facility] INVASIVE CV LAB;  Service: Cardiovascular;  Laterality: N/A;   VISCERAL  ANGIOGRAPHY N/A 12/14/2021   Procedure: VISCERAL ANGIOGRAPHY;  Surgeon: Annice Needy, MD;  Location: [facility] INVASIVE CV LAB;  Service: Cardiovascular;  Laterality: N/A;     Social History   Tobacco Use   Smoking status: Never   Smokeless tobacco: Never  Vaping Use   Vaping status: Never Used  Substance Use Topics   Alcohol use: Yes    Comment: rare   Drug use: No      Family History  Problem Relation Age of Onset   Diabetes Mother    Heart disease Mother    Cancer Father    COPD Father      Allergies  Allergen Reactions  Penicillins Anaphylaxis    Has patient had a PCN reaction causing immediate rash, facial/tongue/throat swelling, SOB or lightheadedness with hypotension: Yes Has patient had a PCN reaction causing severe rash involving mucus membranes or skin necrosis: No Has patient had a PCN reaction that required hospitalization Yes Has patient had a PCN reaction occurring within the last 10 years: No If all of the above answers are "NO", then may proceed with Cephalosporin use.   Tavist [Clemastine] Swelling     REVIEW OF SYSTEMS (Negative unless checked)   Constitutional: [x] Weight loss  [] Fever  [] Chills Cardiac: [] Chest pain   [] Chest pressure   [] Palpitations   [] Shortness of breath when laying flat   [] Shortness of breath at rest   [] Shortness of breath with exertion. Vascular:  [] Pain in legs with walking   [] Pain in legs at rest   [] Pain in legs when laying flat   [] Claudication   [] Pain in feet when walking  [] Pain in feet at rest  [] Pain in feet when laying flat   [] History of DVT   [] Phlebitis   [] Swelling in legs   [] Varicose veins   [] Non-healing ulcers Pulmonary:   [] Uses home oxygen   [] Productive cough   [] Hemoptysis   [] Wheeze  [] COPD   [] Asthma Neurologic:  [] Dizziness  [] Blackouts   [] Seizures   [] History of stroke   [] History of TIA  [] Aphasia   [] Temporary blindness   [] Dysphagia   [] Weakness or numbness in arms   [x] Weakness or numbness in  legs Musculoskeletal:  [x] Arthritis   [] Joint swelling   [x] Joint pain   [] Low back pain Hematologic:  [] Easy bruising  [] Easy bleeding   [] Hypercoagulable state   [] Anemic   Gastrointestinal:  [] Blood in stool   [] Vomiting blood  [x] Gastroesophageal reflux/heartburn   [x] Abdominal pain Genitourinary:  [] Chronic kidney disease   [] Difficult urination  [] Frequent urination  [] Burning with urination   [] Hematuria Skin:  [] Rashes   [] Ulcers   [] Wounds Psychological:  [] History of anxiety   []  History of major depression.   Physical Examination  BP (!) 181/71   Pulse 69   Resp 18   Ht 5\' 4"  (1.626 m)   Wt 171 lb 9.6 oz (77.8 kg)   BMI 29.46 kg/m  Gen:  WD/WN, NAD Head: [place]/AT, No temporalis wasting. Ear/Nose/Throat: Hearing grossly intact, nares w/o erythema or drainage Eyes: Conjunctiva clear. Sclera non-icteric Neck: Supple.  Trachea midline Pulmonary:  Good air movement, no use of accessory muscles.  Cardiac: RRR, no JVD Vascular:  Vessel Right Left  Radial Palpable Palpable                                   Gastrointestinal: soft, non-tender/non-distended. No guarding/reflex.  Musculoskeletal: M/S 5/5 throughout.  No deformity or atrophy. No edema. Neurologic: Sensation grossly intact in extremities.  Symmetrical.  Speech is fluent.  Psychiatric: Judgment intact, Mood & affect appropriate for pt's clinical situation. Dermatologic: No rashes or ulcers noted.  No cellulitis or open wounds.      Labs Recent Results (from the past 2160 hours)  CBC with Differential     Status: Abnormal   Collection Time: 09/26/23  9:06 PM  Result Value Ref Range   WBC 6.9 4.0 - 10.5 K/uL   RBC 3.77 (L) 4.22 - 5.81 MIL/uL   Hemoglobin 12.3 (L) 13.0 - 17.0 g/dL   HCT 16.1 (L) 09.6 - 04.5 %   MCV 98.7  80.0 - 100.0 fL   MCH 32.6 26.0 - 34.0 pg   MCHC 33.1 30.0 - 36.0 g/dL   RDW 46.9 62.9 - 52.8 %   Platelets 236 150 - 400 K/uL   nRBC 0.0 0.0 - 0.2 %   Neutrophils Relative % 66 %    Neutro Abs 4.6 1.7 - 7.7 K/uL   Lymphocytes Relative 21 %   Lymphs Abs 1.4 0.7 - 4.0 K/uL   Monocytes Relative 10 %   Monocytes Absolute 0.7 0.1 - 1.0 K/uL   Eosinophils Relative 3 %   Eosinophils Absolute 0.2 0.0 - 0.5 K/uL   Basophils Relative 0 %   Basophils Absolute 0.0 0.0 - 0.1 K/uL   Immature Granulocytes 0 %   Abs Immature Granulocytes 0.02 0.00 - 0.07 K/uL    Comment: Performed at [facility], [address], [place], [place] [address]  Basic metabolic panel     Status: Abnormal   Collection Time: 09/26/23  9:06 PM  Result Value Ref Range   Sodium 137 135 - 145 mmol/L   Potassium 4.9 3.5 - 5.1 mmol/L   Chloride 105 98 - 111 mmol/L   CO2 21 (L) 22 - 32 mmol/L   Glucose, Bld 123 (H) 70 - 99 mg/dL    Comment: Glucose reference range applies only to samples taken after fasting for at least 8 hours.   BUN 39 (H) 8 - 23 mg/dL   Creatinine, Ser 4.01 (H) 0.61 - 1.24 mg/dL   Calcium 8.9 8.9 - 02.7 mg/dL   GFR, Estimated 43 (L) >60 mL/min    Comment: (NOTE) Calculated using the CKD-EPI Creatinine Equation (2021)    Anion gap 11 5 - 15    Comment: Performed at [facility], [address] [place], [place] [address]  Troponin I (High Sensitivity)     Status: None   Collection Time: 09/26/23  9:06 PM  Result Value Ref Range   Troponin I (High Sensitivity) 7 <18 ng/L    Comment: (NOTE) Elevated high sensitivity troponin I (hsTnI) values and significant  changes across serial measurements may suggest ACS but many other  chronic and acute conditions are known to elevate hsTnI results.  Refer to the "Links" section for chest pain algorithms and additional  guidance. Performed at [facility], [address] [place], [place] [address]   CBG monitoring, ED     Status: Abnormal   Collection Time: 10/02/23  5:38 PM  Result Value Ref Range   Glucose-Capillary 236 (H) 70 - 99 mg/dL    Comment: Glucose reference range applies only to samples taken  after fasting for at least 8 hours.  Acetaminophen level     Status: Abnormal   Collection Time: 10/02/23  6:02 PM  Result Value Ref Range   Acetaminophen (Tylenol), Serum <10 (L) 10 - 30 ug/mL    Comment: (NOTE) Therapeutic concentrations vary significantly. A range of 10-30 ug/mL  may be an effective concentration for many patients. However, some  are best treated at concentrations outside of this range. Acetaminophen concentrations >150 ug/mL at 4 hours after ingestion  and >50 ug/mL at 12 hours after ingestion are often associated with  toxic reactions.  Performed at [facility], [address] [place], [place] [address]   CBC with Differential     Status: None   Collection Time: 10/02/23  6:02 PM  Result Value Ref Range   WBC 8.4 4.0 - 10.5 K/uL   RBC 4.26  4.22 - 5.81 MIL/uL   Hemoglobin 13.9 13.0 - 17.0 g/dL   HCT 78.2 95.6 - 21.3 %   MCV 96.5 80.0 - 100.0 fL   MCH 32.6 26.0 - 34.0 pg   MCHC 33.8 30.0 - 36.0 g/dL   RDW 08.6 57.8 - 46.9 %   Platelets 297 150 - 400 K/uL   nRBC 0.0 0.0 - 0.2 %   Neutrophils Relative % 70 %   Neutro Abs 5.9 1.7 - 7.7 K/uL   Lymphocytes Relative 18 %   Lymphs Abs 1.5 0.7 - 4.0 K/uL   Monocytes Relative 9 %   Monocytes Absolute 0.7 0.1 - 1.0 K/uL   Eosinophils Relative 2 %   Eosinophils Absolute 0.2 0.0 - 0.5 K/uL   Basophils Relative 0 %   Basophils Absolute 0.0 0.0 - 0.1 K/uL   Immature Granulocytes 1 %   Abs Immature Granulocytes 0.04 0.00 - 0.07 K/uL    Comment: Performed at [facility], [address] [place], [place] [address]  Comprehensive metabolic panel     Status: Abnormal   Collection Time: 10/02/23  6:02 PM  Result Value Ref Range   Sodium 136 135 - 145 mmol/L   Potassium 4.0 3.5 - 5.1 mmol/L   Chloride 101 98 - 111 mmol/L   CO2 22 22 - 32 mmol/L   Glucose, Bld 212 (H) 70 - 99 mg/dL    Comment: Glucose reference range applies only to samples taken after fasting for at least 8 hours.   BUN 24 (H)  8 - 23 mg/dL   Creatinine, Ser 8.41 (H) 0.61 - 1.24 mg/dL   Calcium 9.1 8.9 - 32.4 mg/dL   Total Protein 7.6 6.5 - 8.1 g/dL   Albumin 4.4 3.5 - 5.0 g/dL   AST 28 15 - 41 U/L   ALT 21 0 - 44 U/L   Alkaline Phosphatase 72 38 - 126 U/L   Total Bilirubin 1.1 0.0 - 1.2 mg/dL   GFR, Estimated 55 (L) >60 mL/min    Comment: (NOTE) Calculated using the CKD-EPI Creatinine Equation (2021)    Anion gap 13 5 - 15    Comment: Performed at [facility], [address] [place], [place] [address]  Ethanol     Status: None   Collection Time: 10/02/23  6:02 PM  Result Value Ref Range   Alcohol, Ethyl (B) <10 <10 mg/dL    Comment: (NOTE) Lowest detectable limit for serum alcohol is 10 mg/dL.  For medical purposes only. Performed at [facility], [address] [place], [place] [address]   Salicylate level     Status: Abnormal   Collection Time: 10/02/23  6:02 PM  Result Value Ref Range   Salicylate Lvl <7.0 (L) 7.0 - 30.0 mg/dL    Comment: Performed at [facility], [address] [place], [place] [address]  Troponin I (High Sensitivity)     Status: None   Collection Time: 10/02/23  6:02 PM  Result Value Ref Range   Troponin I (High Sensitivity) 5 <18 ng/L    Comment: (NOTE) Elevated high sensitivity troponin I (hsTnI) values and significant  changes across serial measurements may suggest ACS but many other  chronic and acute conditions are known to elevate hsTnI results.  Refer to the "Links" section for chest pain algorithms and additional  guidance. Performed at [facility], [address] [place], [place] [address]   Resp panel by RT-PCR (RSV, Flu A&B, Covid) Anterior Nasal  Swab     Status: None   Collection Time: 10/02/23  8:56 PM   Specimen: Anterior Nasal Swab  Result Value Ref Range   SARS Coronavirus 2 by RT PCR NEGATIVE NEGATIVE    Comment: (NOTE) SARS-CoV-2 target nucleic acids are NOT DETECTED.  The SARS-CoV-2 RNA is generally  detectable in upper respiratory specimens during the acute phase of infection. The lowest concentration of SARS-CoV-2 viral copies this assay can detect is 138 copies/mL. A negative result does not preclude SARS-Cov-2 infection and should not be used as the sole basis for treatment or other patient management decisions. A negative result may occur with  improper specimen collection/handling, submission of specimen other than nasopharyngeal swab, presence of viral mutation(s) within the areas targeted by this assay, and inadequate number of viral copies(<138 copies/mL). A negative result must be combined with clinical observations, patient history, and epidemiological information. The expected result is Negative.  Fact Sheet for Patients:  BloggerCourse.com  Fact Sheet for Healthcare Providers:  SeriousBroker.it  This test is no t yet approved or cleared by the [place] FDA and  has been authorized for detection and/or diagnosis of SARS-CoV-2 by FDA under an Emergency Use Authorization (EUA). This EUA will remain  in effect (meaning this test can be used) for the duration of the COVID-19 declaration under Section 564(b)(1) of the Act, 21 U.S.C.section 360bbb-3(b)(1), unless the authorization is terminated  or revoked sooner.       Influenza A by PCR NEGATIVE NEGATIVE   Influenza B by PCR NEGATIVE NEGATIVE    Comment: (NOTE) The Xpert Xpress SARS-CoV-2/FLU/RSV plus assay is intended as an aid in the diagnosis of influenza from Nasopharyngeal swab specimens and should not be used as a sole basis for treatment. Nasal washings and aspirates are unacceptable for Xpert Xpress SARS-CoV-2/FLU/RSV testing.  Fact Sheet for Patients: BloggerCourse.com  Fact Sheet for Healthcare Providers: SeriousBroker.it  This test is not yet approved or cleared by the [place] FDA and has  been authorized for detection and/or diagnosis of SARS-CoV-2 by FDA under an Emergency Use Authorization (EUA). This EUA will remain in effect (meaning this test can be used) for the duration of the COVID-19 declaration under Section 564(b)(1) of the Act, 21 U.S.C. section 360bbb-3(b)(1), unless the authorization is terminated or revoked.     Resp Syncytial Virus by PCR NEGATIVE NEGATIVE    Comment: (NOTE) Fact Sheet for Patients: BloggerCourse.com  Fact Sheet for Healthcare Providers: SeriousBroker.it  This test is not yet approved or cleared by the [place] FDA and has been authorized for detection and/or diagnosis of SARS-CoV-2 by FDA under an Emergency Use Authorization (EUA). This EUA will remain in effect (meaning this test can be used) for the duration of the COVID-19 declaration under Section 564(b)(1) of the Act, 21 U.S.C. section 360bbb-3(b)(1), unless the authorization is terminated or revoked.  Performed at [facility], [address] [place], [place] [address]   Urinalysis, Routine w reflex microscopic -Urine, Clean Catch     Status: Abnormal   Collection Time: 10/02/23  9:49 PM  Result Value Ref Range   Color, Urine YELLOW (A) YELLOW   APPearance CLEAR (A) CLEAR   Specific Gravity, Urine 1.019 1.005 - 1.030   pH 7.0 5.0 - 8.0   Glucose, UA NEGATIVE NEGATIVE mg/dL   Hgb urine dipstick NEGATIVE NEGATIVE   Bilirubin Urine NEGATIVE NEGATIVE   Ketones, ur NEGATIVE NEGATIVE mg/dL   Protein, ur 30 (A) NEGATIVE mg/dL   Nitrite NEGATIVE  NEGATIVE   Leukocytes,Ua NEGATIVE NEGATIVE   RBC / HPF 0-5 0 - 5 RBC/hpf   WBC, UA 0-5 0 - 5 WBC/hpf   Bacteria, UA RARE (A) NONE SEEN   Squamous Epithelial / HPF 0-5 0 - 5 /HPF    Comment: Performed at [facility], [address] [place], [place] [address]  Urine Drug Screen, Qualitative     Status: Abnormal   Collection Time: 10/02/23  9:49 PM  Result  Value Ref Range   Tricyclic, Ur Screen POSITIVE (A) NONE DETECTED   Amphetamines, Ur Screen NONE DETECTED NONE DETECTED   MDMA (Ecstasy)Ur Screen NONE DETECTED NONE DETECTED   Cocaine Metabolite,Ur [place] NONE DETECTED NONE DETECTED   Opiate, Ur Screen NONE DETECTED NONE DETECTED   Phencyclidine (PCP) Ur S NONE DETECTED NONE DETECTED   Cannabinoid 50 Ng, Ur [place] NONE DETECTED NONE DETECTED   Barbiturates, Ur Screen NONE DETECTED NONE DETECTED   Benzodiazepine, Ur Scrn NONE DETECTED NONE DETECTED   Methadone Scn, Ur NONE DETECTED NONE DETECTED    Comment: (NOTE) Tricyclics + metabolites, urine    Cutoff 1000 ng/mL Amphetamines + metabolites, urine  Cutoff 1000 ng/mL MDMA (Ecstasy), urine              Cutoff 500 ng/mL Cocaine Metabolite, urine          Cutoff 300 ng/mL Opiate + metabolites, urine        Cutoff 300 ng/mL Phencyclidine (PCP), urine         Cutoff 25 ng/mL Cannabinoid, urine                 Cutoff 50 ng/mL Barbiturates + metabolites, urine  Cutoff 200 ng/mL Benzodiazepine, urine              Cutoff 200 ng/mL Methadone, urine                   Cutoff 300 ng/mL  The urine drug screen provides only a preliminary, unconfirmed analytical test result and should not be used for non-medical purposes. Clinical consideration and professional judgment should be applied to any positive drug screen result due to possible interfering substances. A more specific alternate chemical method must be used in order to obtain a confirmed analytical result. Gas chromatography / mass spectrometry (GC/MS) is the preferred confirm atory method. Performed at [facility], [address], [place], [place] [address]     Radiology No results found.  Assessment/Plan  Chronic mesenteric ischemia (HCC) Duplex today showed normal velocities in the celiac artery and mildly elevated velocities in the SMA with velocities just above the upper limits of normal but improved from previous.  Symptoms  are improved.  No role for intervention at current.  Will continue to follow this on 27-month intervals due to the recurrent nature in the past.  Bilateral carotid artery disease (HCC) Duplex today shows near normal findings with only minimal wall thickening and plaque and no significant stenosis in either carotid artery.  No change in medications.  This can be checked every 2 years or so.  HTN (hypertension), benign blood pressure control important in reducing the progression of atherosclerotic disease. On appropriate oral medications.     DM type 2 with diabetic peripheral neuropathy (HCC) blood glucose control important in reducing the progression of atherosclerotic disease. Also, involved in wound healing. On appropriate medications.   HLD (hyperlipidemia) lipid control important in reducing the progression of atherosclerotic disease. Continue statin therapy  Festus Barren, MD  11/28/2023 1:12 PM    This note was created with Dragon medical transcription system.  Any errors from dictation are purely unintentional

## 2023-11-28 NOTE — Assessment & Plan Note (Signed)
 Duplex today showed normal velocities in the celiac artery and mildly elevated velocities in the SMA with velocities just above the upper limits of normal but improved from previous.  Symptoms are improved.  No role for intervention at current.  Will continue to follow this on 22-month intervals due to the recurrent nature in the past.

## 2023-11-28 NOTE — Assessment & Plan Note (Signed)
 Duplex today shows near normal findings with only minimal wall thickening and plaque and no significant stenosis in either carotid artery.  No change in medications.  This can be checked every 2 years or so.

## 2024-01-09 ENCOUNTER — Other Ambulatory Visit: Payer: Self-pay | Admitting: Cardiovascular Disease

## 2024-01-12 DIAGNOSIS — F332 Major depressive disorder, recurrent severe without psychotic features: Secondary | ICD-10-CM | POA: Diagnosis not present

## 2024-01-12 DIAGNOSIS — F418 Other specified anxiety disorders: Secondary | ICD-10-CM | POA: Diagnosis not present

## 2024-01-12 DIAGNOSIS — F02A2 Dementia in other diseases classified elsewhere, mild, with psychotic disturbance: Secondary | ICD-10-CM | POA: Diagnosis not present

## 2024-01-30 ENCOUNTER — Encounter (INDEPENDENT_AMBULATORY_CARE_PROVIDER_SITE_OTHER): Payer: Self-pay

## 2024-02-06 DIAGNOSIS — E1142 Type 2 diabetes mellitus with diabetic polyneuropathy: Secondary | ICD-10-CM | POA: Diagnosis not present

## 2024-02-06 DIAGNOSIS — E559 Vitamin D deficiency, unspecified: Secondary | ICD-10-CM | POA: Diagnosis not present

## 2024-02-06 DIAGNOSIS — Z8781 Personal history of (healed) traumatic fracture: Secondary | ICD-10-CM | POA: Diagnosis not present

## 2024-02-06 DIAGNOSIS — M810 Age-related osteoporosis without current pathological fracture: Secondary | ICD-10-CM | POA: Diagnosis not present

## 2024-02-07 DIAGNOSIS — E113313 Type 2 diabetes mellitus with moderate nonproliferative diabetic retinopathy with macular edema, bilateral: Secondary | ICD-10-CM | POA: Diagnosis not present

## 2024-02-12 DIAGNOSIS — Z794 Long term (current) use of insulin: Secondary | ICD-10-CM | POA: Diagnosis not present

## 2024-02-12 DIAGNOSIS — Z8781 Personal history of (healed) traumatic fracture: Secondary | ICD-10-CM | POA: Diagnosis not present

## 2024-02-12 DIAGNOSIS — I10 Essential (primary) hypertension: Secondary | ICD-10-CM | POA: Diagnosis not present

## 2024-02-12 DIAGNOSIS — E113393 Type 2 diabetes mellitus with moderate nonproliferative diabetic retinopathy without macular edema, bilateral: Secondary | ICD-10-CM | POA: Diagnosis not present

## 2024-02-12 DIAGNOSIS — E1122 Type 2 diabetes mellitus with diabetic chronic kidney disease: Secondary | ICD-10-CM | POA: Diagnosis not present

## 2024-02-12 DIAGNOSIS — E1142 Type 2 diabetes mellitus with diabetic polyneuropathy: Secondary | ICD-10-CM | POA: Diagnosis not present

## 2024-02-12 DIAGNOSIS — E1159 Type 2 diabetes mellitus with other circulatory complications: Secondary | ICD-10-CM | POA: Diagnosis not present

## 2024-02-12 DIAGNOSIS — E11649 Type 2 diabetes mellitus with hypoglycemia without coma: Secondary | ICD-10-CM | POA: Diagnosis not present

## 2024-02-12 DIAGNOSIS — M810 Age-related osteoporosis without current pathological fracture: Secondary | ICD-10-CM | POA: Diagnosis not present

## 2024-02-13 DIAGNOSIS — E1142 Type 2 diabetes mellitus with diabetic polyneuropathy: Secondary | ICD-10-CM | POA: Diagnosis not present

## 2024-02-22 ENCOUNTER — Ambulatory Visit (INDEPENDENT_AMBULATORY_CARE_PROVIDER_SITE_OTHER): Admitting: Cardiovascular Disease

## 2024-02-22 ENCOUNTER — Encounter: Payer: Self-pay | Admitting: Cardiovascular Disease

## 2024-02-22 VITALS — BP 118/50 | HR 59 | Ht 64.0 in | Wt 169.0 lb

## 2024-02-22 DIAGNOSIS — E782 Mixed hyperlipidemia: Secondary | ICD-10-CM | POA: Diagnosis not present

## 2024-02-22 DIAGNOSIS — R55 Syncope and collapse: Secondary | ICD-10-CM

## 2024-02-22 DIAGNOSIS — R001 Bradycardia, unspecified: Secondary | ICD-10-CM

## 2024-02-22 DIAGNOSIS — I350 Nonrheumatic aortic (valve) stenosis: Secondary | ICD-10-CM

## 2024-02-22 DIAGNOSIS — I952 Hypotension due to drugs: Secondary | ICD-10-CM | POA: Diagnosis not present

## 2024-02-22 DIAGNOSIS — I6523 Occlusion and stenosis of bilateral carotid arteries: Secondary | ICD-10-CM | POA: Diagnosis not present

## 2024-02-22 NOTE — Progress Notes (Signed)
 Cardiology Office Note   Date:  02/22/2024   ID:  Patrick Turner, DOB 01/14/1946, MRN [id number]  PCP:  Jimmy Moulding, MD  Cardiologist:  Debborah Fairly, MD      History of Present Illness: Patrick Turner is a 78 y.o. male who presents for  Chief Complaint  Patient presents with   Follow-up    3 months follow up    Head is spinning and feels bad. Had 3-4 episodes yesterday and then passed out.      Past Medical History:  Diagnosis Date   Acute on chronic renal failure (HCC) 03/23/2019   Acute on chronic renal insufficiency 05/30/2016   Altered mental status    Anemia    Anxiety    Aortic atherosclerosis (HCC)    Arthritis    Coronary artery disease    CRD (chronic renal disease)    Depression    Diabetes mellitus without complication (HCC)    Encephalopathy acute    GERD (gastroesophageal reflux disease)    Headache    Hypercholesteremia    Hypertension    Neuropathy    Severe obesity (HCC)    Sleep apnea    Sleep terror    per patient this year per patient      Past Surgical History:  Procedure Laterality Date   APPENDECTOMY     CIRCUMCISION     COLONOSCOPY     COLONOSCOPY WITH PROPOFOL  N/A 07/10/2018   Procedure: COLONOSCOPY WITH PROPOFOL ;  Surgeon: Deveron Fly, MD;  Location: [facility] ENDOSCOPY;  Service: Endoscopy;  Laterality: N/A;   COLONOSCOPY WITH PROPOFOL  N/A 04/23/2019   Procedure: COLONOSCOPY WITH PROPOFOL ;  Surgeon: Deveron Fly, MD;  Location: [facility] ENDOSCOPY;  Service: Endoscopy;  Laterality: N/A;   CORONARY STENT INTERVENTION N/A 10/03/2019   Procedure: CORONARY STENT INTERVENTION;  Surgeon: Antonette Batters, MD;  Location: [facility] INVASIVE CV LAB;  Service: Cardiovascular;  Laterality: N/A;   ESOPHAGOGASTRODUODENOSCOPY     ESOPHAGOGASTRODUODENOSCOPY (EGD) WITH PROPOFOL  N/A 04/23/2019   Procedure: ESOPHAGOGASTRODUODENOSCOPY (EGD) WITH PROPOFOL ;  Surgeon: Deveron Fly, MD;  Location: [facility] ENDOSCOPY;  Service:  Endoscopy;  Laterality: N/A;   INTRAMEDULLARY (IM) NAIL INTERTROCHANTERIC Left 09/17/2021   Procedure: INTRAMEDULLARY (IM) NAIL INTERTROCHANTRIC;  Surgeon: Murleen Arms, MD;  Location: [facility] OR;  Service: Orthopedics;  Laterality: Left;   JOINT REPLACEMENT     KNEE ARTHROPLASTY Right 06/27/2016   Procedure: COMPUTER ASSISTED TOTAL KNEE ARTHROPLASTY;  Surgeon: Arlyne Lame, MD;  Location: [facility] ORS;  Service: Orthopedics;  Laterality: Right;   KNEE ARTHROSCOPY Right    LEFT HEART CATH AND CORONARY ANGIOGRAPHY N/A 10/03/2019   Procedure: Left Heart Cath and possible Coronary intervention;  Surgeon: Cherrie Cornwall, MD;  Location: [facility] INVASIVE CV LAB;  Service: Cardiovascular;  Laterality: N/A;   TONSILLECTOMY     VISCERAL ANGIOGRAPHY N/A 05/23/2019   Procedure: VISCERAL ANGIOGRAPHY;  Surgeon: Celso College, MD;  Location: [facility] INVASIVE CV LAB;  Service: Cardiovascular;  Laterality: N/A;   VISCERAL ANGIOGRAPHY N/A 11/04/2019   Procedure: VISCERAL ANGIOGRAPHY;  Surgeon: Celso College, MD;  Location: [facility] INVASIVE CV LAB;  Service: Cardiovascular;  Laterality: N/A;   VISCERAL ANGIOGRAPHY N/A 12/14/2021   Procedure: VISCERAL ANGIOGRAPHY;  Surgeon: Celso College, MD;  Location: [facility] INVASIVE CV LAB;  Service: Cardiovascular;  Laterality: N/A;     Current Outpatient Medications  Medication Sig Dispense Refill   alendronate (FOSAMAX) 70 MG tablet Take 70 mg by mouth once a week.  Take with a full glass of water on an empty stomach.     ascorbic acid  (VITAMIN C ) 500 MG tablet Take 500 mg by mouth daily.     aspirin  81 MG tablet Take 1 tablet (81 mg total) by mouth daily.     atorvastatin  (LIPITOR ) 80 MG tablet Take 80 mg by mouth daily.     clopidogrel  (PLAVIX ) 75 MG tablet TAKE ONE TABLET BY MOUTH EVERY DAY 90 tablet 1   donepezil  (ARICEPT ) 5 MG tablet Take 1 tablet (5 mg total) by mouth at bedtime. 30 tablet 0   escitalopram  (LEXAPRO ) 10 MG tablet Take 10 mg by mouth daily.     gabapentin  (NEURONTIN )  400 MG capsule Take 400 mg by mouth 2 (two) times daily.     insulin  glargine (LANTUS ) 100 UNIT/ML injection Inject 11 Units into the skin daily.     iron  polysaccharides (NIFEREX) 150 MG capsule Take 150 mg by mouth daily.     metFORMIN  (GLUCOPHAGE -XR) 500 MG 24 hr tablet SMARTSIG:2.0 Tablet(s) By Mouth Twice Daily     Omega-3 Fatty Acids (FISH OIL) 1000 MG CAPS Take 1,000 mg by mouth daily.     OZEMPIC, 0.25 OR 0.5 MG/DOSE, 2 MG/3ML SOPN Inject 0.5 mg into the skin once a week.     OZEMPIC, 1 MG/DOSE, 4 MG/3ML SOPN Inject 0.5 mg into the skin once a week.     pantoprazole  (PROTONIX ) 40 MG tablet Take 40 mg by mouth daily.     QUEtiapine  (SEROQUEL ) 100 MG tablet Take 100 mg by mouth at bedtime.     QUEtiapine  (SEROQUEL ) 25 MG tablet Take 1 tablet (25 mg total) by mouth in the morning. 30 tablet 2   ranolazine  (RANEXA ) 1000 MG SR tablet TAKE 1 TABLET TWICE DAILY 180 tablet 3   valsartan  (DIOVAN ) 80 MG tablet Take 1 tablet (80 mg total) by mouth daily. 30 tablet 3   vitamin B-12 (CYANOCOBALAMIN ) 1000 MCG tablet Take 1,000 mcg by mouth daily.     No current facility-administered medications for this visit.    Allergies:   Penicillins and Tavist [clemastine]    Social History:   reports that he has never smoked. He has never used smokeless tobacco. He reports current alcohol use. He reports that he does not use drugs.   Family History:  family history includes COPD in his father; Cancer in his father; Diabetes in his mother; Heart disease in his mother.    ROS:     Review of Systems  Constitutional: Negative.   HENT: Negative.    Eyes: Negative.   Respiratory: Negative.    Gastrointestinal: Negative.   Genitourinary: Negative.   Musculoskeletal: Negative.   Skin: Negative.   Neurological: Negative.   Endo/Heme/Allergies: Negative.   Psychiatric/Behavioral: Negative.    All other systems reviewed and are negative.     All other systems are reviewed and negative.    PHYSICAL  EXAM: VS:  BP (!) 118/50   Pulse (!) 59   Ht 5' 4 (1.626 m)   Wt 169 lb (76.7 kg)   SpO2 98%   BMI 29.01 kg/m  , BMI Body mass index is 29.01 kg/m. Last weight:  Wt Readings from Last 3 Encounters:  02/22/24 169 lb (76.7 kg)  11/28/23 171 lb 9.6 oz (77.8 kg)  11/23/23 173 lb (78.5 kg)     Physical Exam Vitals reviewed.  Constitutional:      Appearance: Normal appearance. He is normal weight.  HENT:  Head: Normocephalic.     Nose: Nose normal.     Mouth/Throat:     Mouth: Mucous membranes are moist.   Eyes:     Pupils: Pupils are equal, round, and reactive to light.    Cardiovascular:     Rate and Rhythm: Normal rate and regular rhythm.     Pulses: Normal pulses.     Heart sounds: Normal heart sounds.  Pulmonary:     Effort: Pulmonary effort is normal.  Abdominal:     General: Abdomen is flat. Bowel sounds are normal.   Musculoskeletal:        General: Normal range of motion.     Cervical back: Normal range of motion.   Skin:    General: Skin is warm.   Neurological:     General: No focal deficit present.     Mental Status: He is alert.   Psychiatric:        Mood and Affect: Mood normal.       EKG:   Recent Labs: 10/02/2023: ALT 21; BUN 24; Creatinine, Ser 1.32; Hemoglobin 13.9; Platelets 297; Potassium 4.0; Sodium 136    Lipid Panel    Component Value Date/Time   CHOL 88 02/08/2023 0430   TRIG 99 02/08/2023 0430   HDL 40 (L) 02/08/2023 0430   CHOLHDL 2.2 02/08/2023 0430   VLDL 20 02/08/2023 0430   LDLCALC 28 02/08/2023 0430      Other studies Reviewed: Additional studies/ records that were reviewed today include:  Review of the above records demonstrates:       No data to display            ASSESSMENT AND PLAN:    ICD-10-CM   1. Nonrheumatic aortic valve stenosis  I35.0     2. Syncope, unspecified syncope type  R55    due to low BP    3. Mixed hyperlipidemia  E78.2     4. Sinus bradycardia  R00.1    stop core     5. Bilateral carotid artery stenosis  I65.23     6. Hypotension due to drugs  I95.2    Repeat BP 90/40, and heart rate 55. Stop amlodapine 10, and coreg  3.125       Problem List Items Addressed This Visit       Cardiovascular and Mediastinum   Bilateral carotid artery disease (HCC)   Nonrheumatic aortic valve stenosis - Primary     Other   HLD (hyperlipidemia)   Other Visit Diagnoses       Syncope, unspecified syncope type       due to low BP     Sinus bradycardia       stop core     Hypotension due to drugs       Repeat BP 90/40, and heart rate 55. Stop amlodapine 10, and coreg  3.125          Disposition:   Return in about 1 week (around 02/29/2024) for f/u 1 week.    Total time spent: 30 minutes  Signed,  Debborah Fairly, MD  02/22/2024 10:07 AM    [facility]

## 2024-02-23 ENCOUNTER — Ambulatory Visit: Admitting: Cardiovascular Disease

## 2024-02-26 DIAGNOSIS — I129 Hypertensive chronic kidney disease with stage 1 through stage 4 chronic kidney disease, or unspecified chronic kidney disease: Secondary | ICD-10-CM | POA: Diagnosis not present

## 2024-02-26 DIAGNOSIS — E1122 Type 2 diabetes mellitus with diabetic chronic kidney disease: Secondary | ICD-10-CM | POA: Diagnosis not present

## 2024-02-26 DIAGNOSIS — R809 Proteinuria, unspecified: Secondary | ICD-10-CM | POA: Diagnosis not present

## 2024-02-26 DIAGNOSIS — E875 Hyperkalemia: Secondary | ICD-10-CM | POA: Diagnosis not present

## 2024-02-26 DIAGNOSIS — N2581 Secondary hyperparathyroidism of renal origin: Secondary | ICD-10-CM | POA: Diagnosis not present

## 2024-02-26 DIAGNOSIS — N1831 Chronic kidney disease, stage 3a: Secondary | ICD-10-CM | POA: Diagnosis not present

## 2024-02-26 DIAGNOSIS — D631 Anemia in chronic kidney disease: Secondary | ICD-10-CM | POA: Diagnosis not present

## 2024-02-28 DIAGNOSIS — I129 Hypertensive chronic kidney disease with stage 1 through stage 4 chronic kidney disease, or unspecified chronic kidney disease: Secondary | ICD-10-CM | POA: Diagnosis not present

## 2024-02-28 DIAGNOSIS — E1122 Type 2 diabetes mellitus with diabetic chronic kidney disease: Secondary | ICD-10-CM | POA: Diagnosis not present

## 2024-02-28 DIAGNOSIS — D631 Anemia in chronic kidney disease: Secondary | ICD-10-CM | POA: Diagnosis not present

## 2024-02-28 DIAGNOSIS — N2581 Secondary hyperparathyroidism of renal origin: Secondary | ICD-10-CM | POA: Diagnosis not present

## 2024-02-28 DIAGNOSIS — E875 Hyperkalemia: Secondary | ICD-10-CM | POA: Diagnosis not present

## 2024-02-28 DIAGNOSIS — N1831 Chronic kidney disease, stage 3a: Secondary | ICD-10-CM | POA: Diagnosis not present

## 2024-02-28 DIAGNOSIS — G20A1 Parkinson's disease without dyskinesia, without mention of fluctuations: Secondary | ICD-10-CM | POA: Diagnosis not present

## 2024-02-28 DIAGNOSIS — R809 Proteinuria, unspecified: Secondary | ICD-10-CM | POA: Diagnosis not present

## 2024-02-29 ENCOUNTER — Encounter: Payer: Self-pay | Admitting: Cardiovascular Disease

## 2024-02-29 ENCOUNTER — Ambulatory Visit: Admitting: Cardiovascular Disease

## 2024-02-29 VITALS — BP 140/60 | HR 77 | Ht 64.0 in | Wt 174.2 lb

## 2024-02-29 DIAGNOSIS — R55 Syncope and collapse: Secondary | ICD-10-CM | POA: Diagnosis not present

## 2024-02-29 DIAGNOSIS — I952 Hypotension due to drugs: Secondary | ICD-10-CM

## 2024-02-29 DIAGNOSIS — E782 Mixed hyperlipidemia: Secondary | ICD-10-CM | POA: Diagnosis not present

## 2024-02-29 DIAGNOSIS — R001 Bradycardia, unspecified: Secondary | ICD-10-CM | POA: Diagnosis not present

## 2024-02-29 DIAGNOSIS — I2510 Atherosclerotic heart disease of native coronary artery without angina pectoris: Secondary | ICD-10-CM

## 2024-02-29 DIAGNOSIS — I6523 Occlusion and stenosis of bilateral carotid arteries: Secondary | ICD-10-CM

## 2024-02-29 DIAGNOSIS — I350 Nonrheumatic aortic (valve) stenosis: Secondary | ICD-10-CM

## 2024-02-29 DIAGNOSIS — I2583 Coronary atherosclerosis due to lipid rich plaque: Secondary | ICD-10-CM | POA: Diagnosis not present

## 2024-02-29 MED ORDER — VALSARTAN 40 MG PO TABS: 40.0000 mg | ORAL_TABLET | Freq: Every day | ORAL | 11 refills | Status: DC

## 2024-02-29 NOTE — Progress Notes (Signed)
 Cardiology Office Note   Date:  02/29/2024   ID:  Patrick Turner, DOB 1946/04/02, MRN [id number]  PCP:  Jimmy Moulding, MD  Cardiologist:  Debborah Fairly, MD      History of Present Illness: Patrick Turner is a 78 y.o. male who presents for  Chief Complaint  Patient presents with   Follow-up    1 week follow up    Has DOE, no chest pain.      Past Medical History:  Diagnosis Date   Acute on chronic renal failure (HCC) 03/23/2019   Acute on chronic renal insufficiency 05/30/2016   Altered mental status    Anemia    Anxiety    Aortic atherosclerosis (HCC)    Arthritis    Coronary artery disease    CRD (chronic renal disease)    Depression    Diabetes mellitus without complication (HCC)    Encephalopathy acute    GERD (gastroesophageal reflux disease)    Headache    Hypercholesteremia    Hypertension    Neuropathy    Severe obesity (HCC)    Sleep apnea    Sleep terror    per patient this year per patient      Past Surgical History:  Procedure Laterality Date   APPENDECTOMY     CIRCUMCISION     COLONOSCOPY     COLONOSCOPY WITH PROPOFOL  N/A 07/10/2018   Procedure: COLONOSCOPY WITH PROPOFOL ;  Surgeon: Deveron Fly, MD;  Location: [facility] ENDOSCOPY;  Service: Endoscopy;  Laterality: N/A;   COLONOSCOPY WITH PROPOFOL  N/A 04/23/2019   Procedure: COLONOSCOPY WITH PROPOFOL ;  Surgeon: Deveron Fly, MD;  Location: [facility] ENDOSCOPY;  Service: Endoscopy;  Laterality: N/A;   CORONARY STENT INTERVENTION N/A 10/03/2019   Procedure: CORONARY STENT INTERVENTION;  Surgeon: Antonette Batters, MD;  Location: [facility] INVASIVE CV LAB;  Service: Cardiovascular;  Laterality: N/A;   ESOPHAGOGASTRODUODENOSCOPY     ESOPHAGOGASTRODUODENOSCOPY (EGD) WITH PROPOFOL  N/A 04/23/2019   Procedure: ESOPHAGOGASTRODUODENOSCOPY (EGD) WITH PROPOFOL ;  Surgeon: Deveron Fly, MD;  Location: [facility] ENDOSCOPY;  Service: Endoscopy;  Laterality: N/A;   INTRAMEDULLARY (IM) NAIL  INTERTROCHANTERIC Left 09/17/2021   Procedure: INTRAMEDULLARY (IM) NAIL INTERTROCHANTRIC;  Surgeon: Murleen Arms, MD;  Location: [facility] OR;  Service: Orthopedics;  Laterality: Left;   JOINT REPLACEMENT     KNEE ARTHROPLASTY Right 06/27/2016   Procedure: COMPUTER ASSISTED TOTAL KNEE ARTHROPLASTY;  Surgeon: Arlyne Lame, MD;  Location: [facility] ORS;  Service: Orthopedics;  Laterality: Right;   KNEE ARTHROSCOPY Right    LEFT HEART CATH AND CORONARY ANGIOGRAPHY N/A 10/03/2019   Procedure: Left Heart Cath and possible Coronary intervention;  Surgeon: Cherrie Cornwall, MD;  Location: [facility] INVASIVE CV LAB;  Service: Cardiovascular;  Laterality: N/A;   TONSILLECTOMY     VISCERAL ANGIOGRAPHY N/A 05/23/2019   Procedure: VISCERAL ANGIOGRAPHY;  Surgeon: Celso College, MD;  Location: [facility] INVASIVE CV LAB;  Service: Cardiovascular;  Laterality: N/A;   VISCERAL ANGIOGRAPHY N/A 11/04/2019   Procedure: VISCERAL ANGIOGRAPHY;  Surgeon: Celso College, MD;  Location: [facility] INVASIVE CV LAB;  Service: Cardiovascular;  Laterality: N/A;   VISCERAL ANGIOGRAPHY N/A 12/14/2021   Procedure: VISCERAL ANGIOGRAPHY;  Surgeon: Celso College, MD;  Location: [facility] INVASIVE CV LAB;  Service: Cardiovascular;  Laterality: N/A;     Current Outpatient Medications  Medication Sig Dispense Refill   alendronate (FOSAMAX) 70 MG tablet Take 70 mg by mouth once a week. Take with a full glass of water on an  empty stomach.     ascorbic acid  (VITAMIN C ) 500 MG tablet Take 500 mg by mouth daily.     aspirin  81 MG tablet Take 1 tablet (81 mg total) by mouth daily.     atorvastatin  (LIPITOR ) 80 MG tablet Take 80 mg by mouth daily.     clopidogrel  (PLAVIX ) 75 MG tablet TAKE ONE TABLET BY MOUTH EVERY DAY 90 tablet 1   donepezil  (ARICEPT ) 5 MG tablet Take 1 tablet (5 mg total) by mouth at bedtime. 30 tablet 0   escitalopram  (LEXAPRO ) 10 MG tablet Take 10 mg by mouth daily.     gabapentin  (NEURONTIN ) 400 MG capsule Take 400 mg by mouth 2 (two) times daily.      insulin  glargine (LANTUS ) 100 UNIT/ML injection Inject 11 Units into the skin daily.     iron  polysaccharides (NIFEREX) 150 MG capsule Take 150 mg by mouth daily.     metFORMIN  (GLUCOPHAGE -XR) 500 MG 24 hr tablet SMARTSIG:2.0 Tablet(s) By Mouth Twice Daily     Omega-3 Fatty Acids (FISH OIL) 1000 MG CAPS Take 1,000 mg by mouth daily.     OZEMPIC, 0.25 OR 0.5 MG/DOSE, 2 MG/3ML SOPN Inject 0.5 mg into the skin once a week.     OZEMPIC, 1 MG/DOSE, 4 MG/3ML SOPN Inject 0.5 mg into the skin once a week.     pantoprazole  (PROTONIX ) 40 MG tablet Take 40 mg by mouth daily.     QUEtiapine  (SEROQUEL ) 100 MG tablet Take 100 mg by mouth at bedtime.     QUEtiapine  (SEROQUEL ) 25 MG tablet Take 1 tablet (25 mg total) by mouth in the morning. 30 tablet 2   ranolazine  (RANEXA ) 1000 MG SR tablet TAKE 1 TABLET TWICE DAILY 180 tablet 3   valsartan  (DIOVAN ) 40 MG tablet Take 1 tablet (40 mg total) by mouth daily. 30 tablet 11   vitamin B-12 (CYANOCOBALAMIN ) 1000 MCG tablet Take 1,000 mcg by mouth daily.     No current facility-administered medications for this visit.    Allergies:   Penicillins and Tavist [clemastine]    Social History:   reports that he has never smoked. He has never used smokeless tobacco. He reports current alcohol use. He reports that he does not use drugs.   Family History:  family history includes COPD in his father; Cancer in his father; Diabetes in his mother; Heart disease in his mother.    ROS:     Review of Systems  Constitutional: Negative.   HENT: Negative.    Eyes: Negative.   Respiratory: Negative.    Gastrointestinal: Negative.   Genitourinary: Negative.   Musculoskeletal: Negative.   Skin: Negative.   Neurological: Negative.   Endo/Heme/Allergies: Negative.   Psychiatric/Behavioral: Negative.    All other systems reviewed and are negative.     All other systems are reviewed and negative.    PHYSICAL EXAM: VS:  BP (!) 140/60   Pulse 77   Ht 5' 4 (1.626  m)   Wt 174 lb 3.2 oz (79 kg)   SpO2 96%   BMI 29.90 kg/m  , BMI Body mass index is 29.9 kg/m. Last weight:  Wt Readings from Last 3 Encounters:  02/29/24 174 lb 3.2 oz (79 kg)  02/22/24 169 lb (76.7 kg)  11/28/23 171 lb 9.6 oz (77.8 kg)     Physical Exam Vitals reviewed.  Constitutional:      Appearance: Normal appearance. He is normal weight.  HENT:     Head: Normocephalic.  Nose: Nose normal.     Mouth/Throat:     Mouth: Mucous membranes are moist.   Eyes:     Pupils: Pupils are equal, round, and reactive to light.    Cardiovascular:     Rate and Rhythm: Normal rate and regular rhythm.     Pulses: Normal pulses.     Heart sounds: Normal heart sounds.  Pulmonary:     Effort: Pulmonary effort is normal.  Abdominal:     General: Abdomen is flat. Bowel sounds are normal.   Musculoskeletal:        General: Normal range of motion.     Cervical back: Normal range of motion.   Skin:    General: Skin is warm.   Neurological:     General: No focal deficit present.     Mental Status: He is alert.   Psychiatric:        Mood and Affect: Mood normal.       EKG:   Recent Labs: 10/02/2023: ALT 21; BUN 24; Creatinine, Ser 1.32; Hemoglobin 13.9; Platelets 297; Potassium 4.0; Sodium 136    Lipid Panel    Component Value Date/Time   CHOL 88 02/08/2023 0430   TRIG 99 02/08/2023 0430   HDL 40 (L) 02/08/2023 0430   CHOLHDL 2.2 02/08/2023 0430   VLDL 20 02/08/2023 0430   LDLCALC 28 02/08/2023 0430      Other studies Reviewed: Additional studies/ records that were reviewed today include:  Review of the above records demonstrates:       No data to display            ASSESSMENT AND PLAN:    ICD-10-CM   1. Hypotension due to drugs  I95.2 valsartan  (DIOVAN ) 40 MG tablet   Diovan  changed to 40 as was hypotensive,and dizzy standing up. That fine, as diastolic is 60, but systilic 140.    2. Nonrheumatic aortic valve stenosis  I35.0 valsartan  (DIOVAN ) 40  MG tablet    3. Syncope, unspecified syncope type  R55 valsartan  (DIOVAN ) 40 MG tablet    4. Mixed hyperlipidemia  E78.2 valsartan  (DIOVAN ) 40 MG tablet    5. Coronary artery disease due to lipid rich plaque  I25.10 valsartan  (DIOVAN ) 40 MG tablet   I25.83     6. Sinus bradycardia  R00.1 valsartan  (DIOVAN ) 40 MG tablet    7. Bilateral carotid artery stenosis  I65.23 valsartan  (DIOVAN ) 40 MG tablet       Problem List Items Addressed This Visit       Cardiovascular and Mediastinum   Bilateral carotid artery disease (HCC)   Relevant Medications   valsartan  (DIOVAN ) 40 MG tablet   CAD (coronary artery disease)   Relevant Medications   valsartan  (DIOVAN ) 40 MG tablet   Nonrheumatic aortic valve stenosis   Relevant Medications   valsartan  (DIOVAN ) 40 MG tablet     Other   HLD (hyperlipidemia)   Relevant Medications   valsartan  (DIOVAN ) 40 MG tablet   Other Visit Diagnoses       Hypotension due to drugs    -  Primary   Diovan  changed to 40 as was hypotensive,and dizzy standing up. That fine, as diastolic is 60, but systilic 140.   Relevant Medications   valsartan  (DIOVAN ) 40 MG tablet     Syncope, unspecified syncope type       Relevant Medications   valsartan  (DIOVAN ) 40 MG tablet     Sinus bradycardia       Relevant Medications  valsartan  (DIOVAN ) 40 MG tablet          Disposition:   No follow-ups on file.    Total time spent: 30 minutes  Signed,  Debborah Fairly, MD  02/29/2024 1:27 PM    [facility]

## 2024-03-20 DIAGNOSIS — N1831 Chronic kidney disease, stage 3a: Secondary | ICD-10-CM | POA: Diagnosis not present

## 2024-03-20 DIAGNOSIS — N2581 Secondary hyperparathyroidism of renal origin: Secondary | ICD-10-CM | POA: Diagnosis not present

## 2024-03-20 DIAGNOSIS — E875 Hyperkalemia: Secondary | ICD-10-CM | POA: Diagnosis not present

## 2024-03-20 DIAGNOSIS — E1122 Type 2 diabetes mellitus with diabetic chronic kidney disease: Secondary | ICD-10-CM | POA: Diagnosis not present

## 2024-03-20 DIAGNOSIS — I129 Hypertensive chronic kidney disease with stage 1 through stage 4 chronic kidney disease, or unspecified chronic kidney disease: Secondary | ICD-10-CM | POA: Diagnosis not present

## 2024-03-20 DIAGNOSIS — D631 Anemia in chronic kidney disease: Secondary | ICD-10-CM | POA: Diagnosis not present

## 2024-03-20 DIAGNOSIS — R809 Proteinuria, unspecified: Secondary | ICD-10-CM | POA: Diagnosis not present

## 2024-03-27 DIAGNOSIS — N2581 Secondary hyperparathyroidism of renal origin: Secondary | ICD-10-CM | POA: Diagnosis not present

## 2024-03-27 DIAGNOSIS — R809 Proteinuria, unspecified: Secondary | ICD-10-CM | POA: Diagnosis not present

## 2024-03-27 DIAGNOSIS — N1831 Chronic kidney disease, stage 3a: Secondary | ICD-10-CM | POA: Diagnosis not present

## 2024-03-27 DIAGNOSIS — E1122 Type 2 diabetes mellitus with diabetic chronic kidney disease: Secondary | ICD-10-CM | POA: Diagnosis not present

## 2024-03-27 DIAGNOSIS — E875 Hyperkalemia: Secondary | ICD-10-CM | POA: Diagnosis not present

## 2024-03-27 DIAGNOSIS — I129 Hypertensive chronic kidney disease with stage 1 through stage 4 chronic kidney disease, or unspecified chronic kidney disease: Secondary | ICD-10-CM | POA: Diagnosis not present

## 2024-03-27 DIAGNOSIS — D631 Anemia in chronic kidney disease: Secondary | ICD-10-CM | POA: Diagnosis not present

## 2024-03-28 ENCOUNTER — Ambulatory Visit: Admitting: Cardiovascular Disease

## 2024-03-28 ENCOUNTER — Encounter: Payer: Self-pay | Admitting: Cardiovascular Disease

## 2024-03-28 VITALS — BP 128/62 | HR 63 | Ht 64.0 in | Wt 174.0 lb

## 2024-03-28 DIAGNOSIS — I6523 Occlusion and stenosis of bilateral carotid arteries: Secondary | ICD-10-CM | POA: Diagnosis not present

## 2024-03-28 DIAGNOSIS — I2583 Coronary atherosclerosis due to lipid rich plaque: Secondary | ICD-10-CM

## 2024-03-28 DIAGNOSIS — R55 Syncope and collapse: Secondary | ICD-10-CM | POA: Diagnosis not present

## 2024-03-28 DIAGNOSIS — R001 Bradycardia, unspecified: Secondary | ICD-10-CM | POA: Diagnosis not present

## 2024-03-28 DIAGNOSIS — I2510 Atherosclerotic heart disease of native coronary artery without angina pectoris: Secondary | ICD-10-CM | POA: Diagnosis not present

## 2024-03-28 DIAGNOSIS — I350 Nonrheumatic aortic (valve) stenosis: Secondary | ICD-10-CM

## 2024-03-28 DIAGNOSIS — E782 Mixed hyperlipidemia: Secondary | ICD-10-CM | POA: Diagnosis not present

## 2024-03-28 DIAGNOSIS — Z0130 Encounter for examination of blood pressure without abnormal findings: Secondary | ICD-10-CM

## 2024-03-28 DIAGNOSIS — N183 Chronic kidney disease, stage 3 unspecified: Secondary | ICD-10-CM | POA: Diagnosis not present

## 2024-03-28 NOTE — Progress Notes (Signed)
 Cardiology Office Note   Date:  03/28/2024   ID:  Patrick Turner, DOB Jun 24, 1946, MRN [id number]  PCP:  Patrick Layman ORN, MD  Cardiologist:  Patrick Bathe, MD      History of Present Illness: Patrick Turner is a 78 y.o. male who presents for  Chief Complaint  Patient presents with   Follow-up    4 week follow up    Had valsartan  stopped by Dr.Kalro, as crat 1.87 and K was 5.7. Feels fine. BP ok.      Past Medical History:  Diagnosis Date   Acute on chronic renal failure (HCC) 03/23/2019   Acute on chronic renal insufficiency 05/30/2016   Altered mental status    Anemia    Anxiety    Aortic atherosclerosis (HCC)    Arthritis    Coronary artery disease    CRD (chronic renal disease)    Depression    Diabetes mellitus without complication (HCC)    Encephalopathy acute    GERD (gastroesophageal reflux disease)    Headache    Hypercholesteremia    Hypertension    Neuropathy    Severe obesity (HCC)    Sleep apnea    Sleep terror    per patient this year per patient      Past Surgical History:  Procedure Laterality Date   APPENDECTOMY     CIRCUMCISION     COLONOSCOPY     COLONOSCOPY WITH PROPOFOL  N/A 07/10/2018   Procedure: COLONOSCOPY WITH PROPOFOL ;  Surgeon: Patrick Gladis PENNER, MD;  Location: [facility] ENDOSCOPY;  Service: Endoscopy;  Laterality: N/A;   COLONOSCOPY WITH PROPOFOL  N/A 04/23/2019   Procedure: COLONOSCOPY WITH PROPOFOL ;  Surgeon: Patrick Gladis PENNER, MD;  Location: [facility] ENDOSCOPY;  Service: Endoscopy;  Laterality: N/A;   CORONARY STENT INTERVENTION N/A 10/03/2019   Procedure: CORONARY STENT INTERVENTION;  Surgeon: Patrick Cara JONETTA, MD;  Location: [facility] INVASIVE CV LAB;  Service: Cardiovascular;  Laterality: N/A;   ESOPHAGOGASTRODUODENOSCOPY     ESOPHAGOGASTRODUODENOSCOPY (EGD) WITH PROPOFOL  N/A 04/23/2019   Procedure: ESOPHAGOGASTRODUODENOSCOPY (EGD) WITH PROPOFOL ;  Surgeon: Patrick Gladis PENNER, MD;  Location: [facility] ENDOSCOPY;  Service:  Endoscopy;  Laterality: N/A;   INTRAMEDULLARY (IM) NAIL INTERTROCHANTERIC Left 09/17/2021   Procedure: INTRAMEDULLARY (IM) NAIL INTERTROCHANTRIC;  Surgeon: Patrick Toribio LABOR, MD;  Location: [facility] OR;  Service: Orthopedics;  Laterality: Left;   JOINT REPLACEMENT     KNEE ARTHROPLASTY Right 06/27/2016   Procedure: COMPUTER ASSISTED TOTAL KNEE ARTHROPLASTY;  Surgeon: Patrick SHAUNNA Hue, MD;  Location: [facility] ORS;  Service: Orthopedics;  Laterality: Right;   KNEE ARTHROSCOPY Right    LEFT HEART CATH AND CORONARY ANGIOGRAPHY N/A 10/03/2019   Procedure: Left Heart Cath and possible Coronary intervention;  Surgeon: Turner Patrick LABOR, MD;  Location: [facility] INVASIVE CV LAB;  Service: Cardiovascular;  Laterality: N/A;   TONSILLECTOMY     VISCERAL ANGIOGRAPHY N/A 05/23/2019   Procedure: VISCERAL ANGIOGRAPHY;  Surgeon: Patrick Selinda RAMAN, MD;  Location: [facility] INVASIVE CV LAB;  Service: Cardiovascular;  Laterality: N/A;   VISCERAL ANGIOGRAPHY N/A 11/04/2019   Procedure: VISCERAL ANGIOGRAPHY;  Surgeon: Patrick Selinda RAMAN, MD;  Location: [facility] INVASIVE CV LAB;  Service: Cardiovascular;  Laterality: N/A;   VISCERAL ANGIOGRAPHY N/A 12/14/2021   Procedure: VISCERAL ANGIOGRAPHY;  Surgeon: Patrick Selinda RAMAN, MD;  Location: [facility] INVASIVE CV LAB;  Service: Cardiovascular;  Laterality: N/A;     Current Outpatient Medications  Medication Sig Dispense Refill   alendronate (FOSAMAX) 70 MG tablet Take 70 mg by mouth once  a week. Take with a full glass of water on an empty stomach.     ascorbic acid  (VITAMIN C ) 500 MG tablet Take 500 mg by mouth daily.     aspirin  81 MG tablet Take 1 tablet (81 mg total) by mouth daily.     atorvastatin  (LIPITOR ) 80 MG tablet Take 80 mg by mouth daily.     clopidogrel  (PLAVIX ) 75 MG tablet TAKE ONE TABLET BY MOUTH EVERY DAY 90 tablet 1   donepezil  (ARICEPT ) 5 MG tablet Take 1 tablet (5 mg total) by mouth at bedtime. 30 tablet 0   escitalopram  (LEXAPRO ) 10 MG tablet Take 10 mg by mouth daily.     gabapentin  (NEURONTIN )  400 MG capsule Take 400 mg by mouth 2 (two) times daily.     insulin  glargine (LANTUS ) 100 UNIT/ML injection Inject 11 Units into the skin daily.     iron  polysaccharides (NIFEREX) 150 MG capsule Take 150 mg by mouth daily.     metFORMIN  (GLUCOPHAGE -XR) 500 MG 24 hr tablet SMARTSIG:2.0 Tablet(s) By Mouth Twice Daily     Omega-3 Fatty Acids (FISH OIL) 1000 MG CAPS Take 1,000 mg by mouth daily.     OZEMPIC, 0.25 OR 0.5 MG/DOSE, 2 MG/3ML SOPN Inject 0.5 mg into the skin once a week.     OZEMPIC, 1 MG/DOSE, 4 MG/3ML SOPN Inject 0.5 mg into the skin once a week.     pantoprazole  (PROTONIX ) 40 MG tablet Take 40 mg by mouth daily.     QUEtiapine  (SEROQUEL ) 100 MG tablet Take 100 mg by mouth at bedtime.     QUEtiapine  (SEROQUEL ) 25 MG tablet Take 1 tablet (25 mg total) by mouth in the morning. 30 tablet 2   ranolazine  (RANEXA ) 1000 MG SR tablet TAKE 1 TABLET TWICE DAILY 180 tablet 3   valsartan  (DIOVAN ) 40 MG tablet Take 1 tablet (40 mg total) by mouth daily. (Patient not taking: Reported on 03/28/2024) 30 tablet 11   vitamin B-12 (CYANOCOBALAMIN ) 1000 MCG tablet Take 1,000 mcg by mouth daily.     No current facility-administered medications for this visit.    Allergies:   Penicillins and Tavist [clemastine]    Social History:   reports that he has never smoked. He has never used smokeless tobacco. He reports current alcohol use. He reports that he does not use drugs.   Family History:  family history includes COPD in his father; Cancer in his father; Diabetes in his mother; Heart disease in his mother.    ROS:     Review of Systems  Constitutional: Negative.   HENT: Negative.    Eyes: Negative.   Respiratory: Negative.    Gastrointestinal: Negative.   Genitourinary: Negative.   Musculoskeletal: Negative.   Skin: Negative.   Neurological: Negative.   Endo/Heme/Allergies: Negative.   Psychiatric/Behavioral: Negative.    All other systems reviewed and are negative.     All other  systems are reviewed and negative.    PHYSICAL EXAM: VS:  BP 128/62   Pulse 63   Ht 5' 4 (1.626 m)   Wt 174 lb (78.9 kg)   SpO2 98%   BMI 29.87 kg/m  , BMI Body mass index is 29.87 kg/m. Last weight:  Wt Readings from Last 3 Encounters:  03/28/24 174 lb (78.9 kg)  02/29/24 174 lb 3.2 oz (79 kg)  02/22/24 169 lb (76.7 kg)     Physical Exam Vitals reviewed.  Constitutional:      Appearance: Normal appearance. He is  normal weight.  HENT:     Head: Normocephalic.     Nose: Nose normal.     Mouth/Throat:     Mouth: Mucous membranes are moist.  Eyes:     Pupils: Pupils are equal, round, and reactive to light.  Cardiovascular:     Rate and Rhythm: Normal rate and regular rhythm.     Pulses: Normal pulses.     Heart sounds: Normal heart sounds.  Pulmonary:     Effort: Pulmonary effort is normal.  Abdominal:     General: Abdomen is flat. Bowel sounds are normal.  Musculoskeletal:        General: Normal range of motion.     Cervical back: Normal range of motion.  Skin:    General: Skin is warm.  Neurological:     General: No focal deficit present.     Mental Status: He is alert.  Psychiatric:        Mood and Affect: Mood normal.       EKG:   Recent Labs: 10/02/2023: ALT 21; BUN 24; Creatinine, Ser 1.32; Hemoglobin 13.9; Platelets 297; Potassium 4.0; Sodium 136    Lipid Panel    Component Value Date/Time   CHOL 88 02/08/2023 0430   TRIG 99 02/08/2023 0430   HDL 40 (L) 02/08/2023 0430   CHOLHDL 2.2 02/08/2023 0430   VLDL 20 02/08/2023 0430   LDLCALC 28 02/08/2023 0430      Other studies Reviewed: Additional studies/ records that were reviewed today include:  Review of the above records demonstrates:       No data to display            ASSESSMENT AND PLAN:    ICD-10-CM   1. Chronic renal impairment, stage 3 (moderate), unspecified whether stage 3a or 3b CKD (HCC)  N18.30 PCV ECHOCARDIOGRAM COMPLETE   Agree with holding ARB,kidney function in  few weeks. valsartan , and BP is fine. Recheck    2. Nonrheumatic aortic valve stenosis  I35.0 PCV ECHOCARDIOGRAM COMPLETE    3. Syncope, unspecified syncope type  R55 PCV ECHOCARDIOGRAM COMPLETE    4. Mixed hyperlipidemia  E78.2 PCV ECHOCARDIOGRAM COMPLETE    5. Sinus bradycardia  R00.1 PCV ECHOCARDIOGRAM COMPLETE    6. Coronary artery disease due to lipid rich plaque  I25.10 PCV ECHOCARDIOGRAM COMPLETE   I25.83     7. Bilateral carotid artery stenosis  I65.23 PCV ECHOCARDIOGRAM COMPLETE       Problem List Items Addressed This Visit       Cardiovascular and Mediastinum   Bilateral carotid artery disease (HCC)   Relevant Orders   PCV ECHOCARDIOGRAM COMPLETE   CAD (coronary artery disease)   Relevant Orders   PCV ECHOCARDIOGRAM COMPLETE   Nonrheumatic aortic valve stenosis   Relevant Orders   PCV ECHOCARDIOGRAM COMPLETE     Other   HLD (hyperlipidemia)   Relevant Orders   PCV ECHOCARDIOGRAM COMPLETE   Other Visit Diagnoses       Chronic renal impairment, stage 3 (moderate), unspecified whether stage 3a or 3b CKD (HCC)    -  Primary   Agree with holding ARB,kidney function in few weeks. valsartan , and BP is fine. Recheck   Relevant Orders   PCV ECHOCARDIOGRAM COMPLETE     Syncope, unspecified syncope type       Relevant Orders   PCV ECHOCARDIOGRAM COMPLETE     Sinus bradycardia       Relevant Orders   PCV ECHOCARDIOGRAM COMPLETE  Disposition:   Return in about 3 months (around 06/28/2024) for echo and f/u.    Total time spent: 40 minutes  Signed,  Patrick Bathe, MD  03/28/2024 1:50 PM    [facility]

## 2024-04-02 DIAGNOSIS — F02A2 Dementia in other diseases classified elsewhere, mild, with psychotic disturbance: Secondary | ICD-10-CM | POA: Diagnosis not present

## 2024-04-02 DIAGNOSIS — F332 Major depressive disorder, recurrent severe without psychotic features: Secondary | ICD-10-CM | POA: Diagnosis not present

## 2024-04-02 DIAGNOSIS — F418 Other specified anxiety disorders: Secondary | ICD-10-CM | POA: Diagnosis not present

## 2024-04-17 DIAGNOSIS — R809 Proteinuria, unspecified: Secondary | ICD-10-CM | POA: Diagnosis not present

## 2024-04-17 DIAGNOSIS — N1831 Chronic kidney disease, stage 3a: Secondary | ICD-10-CM | POA: Diagnosis not present

## 2024-04-17 DIAGNOSIS — I129 Hypertensive chronic kidney disease with stage 1 through stage 4 chronic kidney disease, or unspecified chronic kidney disease: Secondary | ICD-10-CM | POA: Diagnosis not present

## 2024-04-17 DIAGNOSIS — D631 Anemia in chronic kidney disease: Secondary | ICD-10-CM | POA: Diagnosis not present

## 2024-04-17 DIAGNOSIS — E875 Hyperkalemia: Secondary | ICD-10-CM | POA: Diagnosis not present

## 2024-04-17 DIAGNOSIS — E1122 Type 2 diabetes mellitus with diabetic chronic kidney disease: Secondary | ICD-10-CM | POA: Diagnosis not present

## 2024-04-17 DIAGNOSIS — N2581 Secondary hyperparathyroidism of renal origin: Secondary | ICD-10-CM | POA: Diagnosis not present

## 2024-04-23 DIAGNOSIS — E1159 Type 2 diabetes mellitus with other circulatory complications: Secondary | ICD-10-CM | POA: Diagnosis not present

## 2024-04-23 DIAGNOSIS — I25118 Atherosclerotic heart disease of native coronary artery with other forms of angina pectoris: Secondary | ICD-10-CM | POA: Diagnosis not present

## 2024-04-25 DIAGNOSIS — N2581 Secondary hyperparathyroidism of renal origin: Secondary | ICD-10-CM | POA: Diagnosis not present

## 2024-04-25 DIAGNOSIS — E1122 Type 2 diabetes mellitus with diabetic chronic kidney disease: Secondary | ICD-10-CM | POA: Diagnosis not present

## 2024-04-25 DIAGNOSIS — D631 Anemia in chronic kidney disease: Secondary | ICD-10-CM | POA: Diagnosis not present

## 2024-04-25 DIAGNOSIS — N1832 Chronic kidney disease, stage 3b: Secondary | ICD-10-CM | POA: Diagnosis not present

## 2024-04-25 DIAGNOSIS — R809 Proteinuria, unspecified: Secondary | ICD-10-CM | POA: Diagnosis not present

## 2024-04-25 DIAGNOSIS — I129 Hypertensive chronic kidney disease with stage 1 through stage 4 chronic kidney disease, or unspecified chronic kidney disease: Secondary | ICD-10-CM | POA: Diagnosis not present

## 2024-04-25 DIAGNOSIS — E875 Hyperkalemia: Secondary | ICD-10-CM | POA: Diagnosis not present

## 2024-04-30 DIAGNOSIS — I129 Hypertensive chronic kidney disease with stage 1 through stage 4 chronic kidney disease, or unspecified chronic kidney disease: Secondary | ICD-10-CM | POA: Diagnosis not present

## 2024-04-30 DIAGNOSIS — E1122 Type 2 diabetes mellitus with diabetic chronic kidney disease: Secondary | ICD-10-CM | POA: Diagnosis not present

## 2024-04-30 DIAGNOSIS — E1169 Type 2 diabetes mellitus with other specified complication: Secondary | ICD-10-CM | POA: Diagnosis not present

## 2024-04-30 DIAGNOSIS — E785 Hyperlipidemia, unspecified: Secondary | ICD-10-CM | POA: Diagnosis not present

## 2024-04-30 DIAGNOSIS — E1142 Type 2 diabetes mellitus with diabetic polyneuropathy: Secondary | ICD-10-CM | POA: Diagnosis not present

## 2024-04-30 DIAGNOSIS — F3340 Major depressive disorder, recurrent, in remission, unspecified: Secondary | ICD-10-CM | POA: Diagnosis not present

## 2024-04-30 DIAGNOSIS — I25118 Atherosclerotic heart disease of native coronary artery with other forms of angina pectoris: Secondary | ICD-10-CM | POA: Diagnosis not present

## 2024-04-30 DIAGNOSIS — F03A3 Unspecified dementia, mild, with mood disturbance: Secondary | ICD-10-CM | POA: Diagnosis not present

## 2024-05-03 ENCOUNTER — Other Ambulatory Visit: Payer: Self-pay

## 2024-05-03 ENCOUNTER — Emergency Department
Admission: EM | Admit: 2024-05-03 | Discharge: 2024-05-04 | Disposition: A | Discharge status: Home / Self Care | Attending: Emergency Medicine | Admitting: Emergency Medicine

## 2024-05-03 DIAGNOSIS — R5383 Other fatigue: Secondary | ICD-10-CM

## 2024-05-03 DIAGNOSIS — W228XXA Striking against or struck by other objects, initial encounter: Secondary | ICD-10-CM | POA: Insufficient documentation

## 2024-05-03 DIAGNOSIS — I2510 Atherosclerotic heart disease of native coronary artery without angina pectoris: Secondary | ICD-10-CM | POA: Insufficient documentation

## 2024-05-03 DIAGNOSIS — R531 Weakness: Secondary | ICD-10-CM | POA: Insufficient documentation

## 2024-05-03 DIAGNOSIS — R001 Bradycardia, unspecified: Secondary | ICD-10-CM | POA: Diagnosis not present

## 2024-05-03 DIAGNOSIS — Z043 Encounter for examination and observation following other accident: Secondary | ICD-10-CM | POA: Diagnosis not present

## 2024-05-03 DIAGNOSIS — E119 Type 2 diabetes mellitus without complications: Secondary | ICD-10-CM | POA: Insufficient documentation

## 2024-05-03 DIAGNOSIS — R296 Repeated falls: Secondary | ICD-10-CM | POA: Diagnosis not present

## 2024-05-03 DIAGNOSIS — S0990XA Unspecified injury of head, initial encounter: Secondary | ICD-10-CM | POA: Insufficient documentation

## 2024-05-03 DIAGNOSIS — R4182 Altered mental status, unspecified: Secondary | ICD-10-CM | POA: Insufficient documentation

## 2024-05-03 DIAGNOSIS — I639 Cerebral infarction, unspecified: Secondary | ICD-10-CM | POA: Diagnosis not present

## 2024-05-03 DIAGNOSIS — R300 Dysuria: Secondary | ICD-10-CM | POA: Insufficient documentation

## 2024-05-03 DIAGNOSIS — N189 Chronic kidney disease, unspecified: Secondary | ICD-10-CM | POA: Insufficient documentation

## 2024-05-03 DIAGNOSIS — G4489 Other headache syndrome: Secondary | ICD-10-CM | POA: Diagnosis not present

## 2024-05-03 DIAGNOSIS — I63233 Cerebral infarction due to unspecified occlusion or stenosis of bilateral carotid arteries: Secondary | ICD-10-CM | POA: Diagnosis not present

## 2024-05-03 DIAGNOSIS — I129 Hypertensive chronic kidney disease with stage 1 through stage 4 chronic kidney disease, or unspecified chronic kidney disease: Secondary | ICD-10-CM | POA: Diagnosis not present

## 2024-05-03 DIAGNOSIS — S199XXA Unspecified injury of neck, initial encounter: Secondary | ICD-10-CM | POA: Diagnosis not present

## 2024-05-03 NOTE — ED Triage Notes (Signed)
 Patient brought in via [facility] from work at [employer]. Initial call out for stroke but stroke screen negative. Pinpoint pupils, they administered 1mg  narcan  with no change. Patient is lethargic, slow to respond, but oriented. Reportedly fell last week, is on plavix , but did not get checked out.

## 2024-05-04 ENCOUNTER — Emergency Department

## 2024-05-04 ENCOUNTER — Other Ambulatory Visit: Payer: Self-pay | Admitting: Cardiovascular Disease

## 2024-05-04 DIAGNOSIS — R5383 Other fatigue: Secondary | ICD-10-CM | POA: Diagnosis not present

## 2024-05-04 DIAGNOSIS — I63233 Cerebral infarction due to unspecified occlusion or stenosis of bilateral carotid arteries: Secondary | ICD-10-CM | POA: Diagnosis not present

## 2024-05-04 DIAGNOSIS — I639 Cerebral infarction, unspecified: Secondary | ICD-10-CM | POA: Diagnosis not present

## 2024-05-04 DIAGNOSIS — Z043 Encounter for examination and observation following other accident: Secondary | ICD-10-CM | POA: Diagnosis not present

## 2024-05-04 DIAGNOSIS — S0990XA Unspecified injury of head, initial encounter: Secondary | ICD-10-CM | POA: Diagnosis not present

## 2024-05-04 DIAGNOSIS — S199XXA Unspecified injury of neck, initial encounter: Secondary | ICD-10-CM | POA: Diagnosis not present

## 2024-05-04 DIAGNOSIS — I2510 Atherosclerotic heart disease of native coronary artery without angina pectoris: Secondary | ICD-10-CM

## 2024-05-04 LAB — COMPREHENSIVE METABOLIC PANEL WITH GFR
ALT: 12 U/L (ref 0–44)
AST: 20 U/L (ref 15–41)
Albumin: 3.5 g/dL (ref 3.5–5.0)
Alkaline Phosphatase: 69 U/L (ref 38–126)
Anion gap: 9 (ref 5–15)
BUN: 35 mg/dL — ABNORMAL HIGH (ref 8–23)
CO2: 21 mmol/L — ABNORMAL LOW (ref 22–32)
Calcium: 8.4 mg/dL — ABNORMAL LOW (ref 8.9–10.3)
Chloride: 106 mmol/L (ref 98–111)
Creatinine, Ser: 1.39 mg/dL — ABNORMAL HIGH (ref 0.61–1.24)
GFR, Estimated: 52 mL/min — ABNORMAL LOW (ref >60)
Glucose, Bld: 216 mg/dL — ABNORMAL HIGH (ref 70–99)
Potassium: 4.8 mmol/L (ref 3.5–5.1)
Sodium: 136 mmol/L (ref 135–145)
Total Bilirubin: 0.7 mg/dL (ref 0.0–1.2)
Total Protein: 5.9 g/dL — ABNORMAL LOW (ref 6.5–8.1)

## 2024-05-04 LAB — CBC WITH DIFFERENTIAL/PLATELET
Abs Immature Granulocytes: 0.02 K/uL (ref 0.00–0.07)
Basophils Absolute: 0 K/uL (ref 0.0–0.1)
Basophils Relative: 0 %
Eosinophils Absolute: 0.3 K/uL (ref 0.0–0.5)
Eosinophils Relative: 5 %
HCT: 32.8 % — ABNORMAL LOW (ref 39.0–52.0)
Hemoglobin: 10.7 g/dL — ABNORMAL LOW (ref 13.0–17.0)
Immature Granulocytes: 0 %
Lymphocytes Relative: 26 %
Lymphs Abs: 1.6 K/uL (ref 0.7–4.0)
MCH: 31.8 pg (ref 26.0–34.0)
MCHC: 32.6 g/dL (ref 30.0–36.0)
MCV: 97.6 fL (ref 80.0–100.0)
Monocytes Absolute: 0.6 K/uL (ref 0.1–1.0)
Monocytes Relative: 10 %
Neutro Abs: 3.7 K/uL (ref 1.7–7.7)
Neutrophils Relative %: 59 %
Platelets: 224 K/uL (ref 150–400)
RBC: 3.36 MIL/uL — ABNORMAL LOW (ref 4.22–5.81)
RDW: 13.9 % (ref 11.5–15.5)
WBC: 6.3 K/uL (ref 4.0–10.5)
nRBC: 0 % (ref 0.0–0.2)

## 2024-05-04 LAB — URINALYSIS, W/ REFLEX TO CULTURE (INFECTION SUSPECTED)
Bacteria, UA: NONE SEEN
Bilirubin Urine: NEGATIVE
Glucose, UA: NEGATIVE mg/dL
Hgb urine dipstick: NEGATIVE
Ketones, ur: NEGATIVE mg/dL
Leukocytes,Ua: NEGATIVE
Nitrite: NEGATIVE
Protein, ur: 30 mg/dL — AB
Specific Gravity, Urine: 1.021 (ref 1.005–1.030)
pH: 5 (ref 5.0–8.0)

## 2024-05-04 LAB — ETHANOL: Alcohol, Ethyl (B): <15 mg/dL (ref <15)

## 2024-05-04 LAB — TROPONIN I (HIGH SENSITIVITY)
Troponin I (High Sensitivity): 5 ng/L (ref <18)
Troponin I (High Sensitivity): 5 ng/L (ref <18)

## 2024-05-04 LAB — CK: Total CK: 124 U/L (ref 49–397)

## 2024-05-04 LAB — TSH: TSH: 2.867 u[IU]/mL (ref 0.350–4.500)

## 2024-05-04 LAB — LACTIC ACID, PLASMA: Lactic Acid, Venous: 1.3 mmol/L (ref 0.5–1.9)

## 2024-05-04 LAB — MAGNESIUM: Magnesium: 1.8 mg/dL (ref 1.7–2.4)

## 2024-05-04 LAB — T4, FREE: Free T4: 0.61 ng/dL (ref 0.61–1.12)

## 2024-05-04 MED ORDER — ACETAMINOPHEN 500 MG PO TABS
1000.0000 mg | ORAL_TABLET | Freq: Once | ORAL | Status: AC
  Administered 2024-05-04: 1000 mg via ORAL
  Filled 2024-05-04: qty 2

## 2024-05-04 NOTE — Discharge Instructions (Signed)
 Take acetaminophen 650 mg and ibuprofen 400 mg every 6 hours for pain.  Take with food.  Thank you for choosing [place] for your health care today!  Please see your primary doctor this week for a follow up appointment.   If you have any new, worsening, or unexpected symptoms call your doctor right away or come back to the emergency department for reevaluation.  It was my pleasure to care for you today.   Daneil Dan Modesto Charon, MD

## 2024-05-04 NOTE — ED Provider Notes (Addendum)
 [facility] Provider Note    Event Date/Time   First MD Initiated Contact with Patient 05/03/24 2355     (approximate)   History   Fatigue   HPI  Patrick Turner is a 78 y.o. male   Past medical history of CAD, CKD, diabetes, anxiety and depression, hypertension hyperlipidemia, who presents to the emergency department with generalized weakness and fatigue, employee called from the [employer] where he works noting that he did not look well and the ambulance took him here to the emergency department.  Patrick Turner states that he feels very weak and tired and has had a mild headache and feels my head is not right ever since he hit his head on a metal pipe 3 days ago.  His speech is slow and he states that it has been the same since 3 days ago after his head injury, he did not seek medical attention after that injury.  He notes that earlier today he stumbled down the stairs due to global weakness and fatigue but reports no significant injuries from that fall.  He does note some mild dysuria burning sensation in his penis when he starts to urinate.  He reports no other focal pain or medical complaints.  He specifically denies vision changes, focal weakness or sensory changes, GI symptoms, chest pain or shortness of breath, respiratory infectious symptoms.  He denies drug or alcohol use.   External Medical Documents Reviewed: January 2025 hospital visit for altered mental status also seen by psychiatry thought to be acute delirium of unknown etiology, anxiety, perhaps polypharmacy, was ultimately discharged.      Physical Exam   Triage Vital Signs: ED Triage Vitals  Encounter Vitals Group     BP 05/04/24 0001 (!) 148/70     Girls Systolic BP Percentile --      Girls Diastolic BP Percentile --      Boys Systolic BP Percentile --      Boys Diastolic BP Percentile --      Pulse Rate 05/04/24 0001 (!) 56     Resp 05/04/24 0001 18     Temp 05/04/24 0001 98.1 F  (36.7 C)     Temp Source 05/04/24 0001 Oral     SpO2 05/03/24 2355 97 %     Weight 05/03/24 2358 187 lb 11.2 oz (85.1 kg)     Height 05/03/24 2358 5' 4 (1.626 m)     Head Circumference --      Peak Flow --      Pain Score 05/03/24 2358 0     Pain Loc --      Pain Education --      Exclude from Growth Chart --     Most recent vital signs: Vitals:   05/04/24 0230 05/04/24 0400  BP: (!) 175/68 (!) 164/68  Pulse: (!) 56 (!) 52  Resp: 14 10  Temp:    SpO2: 100% 100%    General: Awake, no distress.  CV:  Good peripheral perfusion.  Resp:  Normal effort.  Abd:  No distention.  Other:  Global weakness without any focal weakness on neurologic exam.  No sensory deficits.  Slow speech, slightly slurred, but answering all questions appropriately awake alert and oriented.  Small but reactive pupils.  Finger-to-nose coordination is normal.  No facial asymmetry.  No obvious signs of head trauma, neck is supple with full range of motion and trauma exam reveals no focal traumatic injuries deformities tenderness to palpation.  Nontender abdominal exam.  Nonfocal nonperitoneal.   ED Results / Procedures / Treatments   Labs (all labs ordered are listed, but only abnormal results are displayed) Labs Reviewed  COMPREHENSIVE METABOLIC PANEL WITH GFR - Abnormal; Notable for the following components:      Result Value   CO2 21 (*)    Glucose, Bld 216 (*)    BUN 35 (*)    Creatinine, Ser 1.39 (*)    Calcium  8.4 (*)    Total Protein 5.9 (*)    GFR, Estimated 52 (*)    All other components within normal limits  CBC WITH DIFFERENTIAL/PLATELET - Abnormal; Notable for the following components:   RBC 3.36 (*)    Hemoglobin 10.7 (*)    HCT 32.8 (*)    All other components within normal limits  URINALYSIS, W/ REFLEX TO CULTURE (INFECTION SUSPECTED) - Abnormal; Notable for the following components:   Color, Urine YELLOW (*)    APPearance CLEAR (*)    Protein, ur 30 (*)    All other components  within normal limits  LACTIC ACID, PLASMA  TSH  T4, FREE  MAGNESIUM   CK  ETHANOL  TROPONIN I (HIGH SENSITIVITY)  TROPONIN I (HIGH SENSITIVITY)     I ordered and reviewed the above labs they are notable for cell counts electrolytes unremarkable.  Hemoglobin 10.7 which is slightly decreased from prior testing.  EKG  ED ECG REPORT I, Patrick Turner, the attending physician, personally viewed and interpreted this ECG.   Date: 05/04/2024  EKG Time: 0002  Rate: 56  Rhythm: sinus bradycardia  Axis: nl  Intervals:nl  ST&T Change: no stemi    RADIOLOGY I independently reviewed and interpreted CT of the head see no obvious bleeding or midline shift I also reviewed radiologist's formal read.   PROCEDURES:  Critical Care performed: No  Procedures   MEDICATIONS ORDERED IN ED: Medications  acetaminophen  (TYLENOL ) tablet 1,000 mg (1,000 mg Oral Given 05/04/24 0250)     IMPRESSION / MDM / ASSESSMENT AND PLAN / ED COURSE  I reviewed the triage vital signs and the nursing notes.                                Patient's presentation is most consistent with acute presentation with potential threat to life or bodily function.  Differential diagnosis includes, but is not limited to, skull fracture ICH or head injury, concussion, urinary tract infection, metabolic derangement, thyroid  abnormality, CVA, tox   The patient is on the cardiac monitor to evaluate for evidence of arrhythmia and/or significant heart rate changes.  MDM:    Similar occurrence of altered mental status in January with no obvious etiology ultimately discharged after period of observation.  Today with a similar presentation though he notes a head injury a couple of days ago so I got a CT of the head to rule out intracranial bleeding or skull fracture though relatively benign exam from a traumatic standpoint.  He reports some mild dysuria but no other acute medical complaints, will check a urinalysis as well as  basic labs.  Check thyroid  function.  Check ethanol level.  I considered stroke but think less likely given no focal findings on neurologic exam and instead global weakness.  He is well outside the thrombolytic window.   -- Workup largely unremarkable.  Patient still sleepy and fatigued and nurse notes that he has slightly confused perhaps as she checked on  him in the middle of the night -reports similar symptoms to when we first spoke on arrival of speech difficulties that he has been feeling for the last 2 days after his head injury.  Checked MRI of the brain which fortunately looks normal, neg stroke.    Reassessed at 6 AM and is awake alert oriented and still feels globally fatigued with a mild headache.  I think that his symptoms may be postconcussive from his head injury 3 days ago after which the symptoms started.  Given his unremarkable workup and stability in the emergency department and can go home at this time.  He will follow-up with his PMD.  Given work note take several days off from work.  Return with any new or worsening symptoms.      FINAL CLINICAL IMPRESSION(S) / ED DIAGNOSES   Final diagnoses:  Other fatigue  Generalized weakness  Altered mental status, unspecified altered mental status type  Dysuria  Frequent falls  Injury of head, initial encounter     Rx / DC Orders   ED Discharge Orders     None        Note:  This document was prepared using Dragon voice recognition software and may include unintentional dictation errors.    Cyrena Mylar, MD 05/04/24 [id number]    Cyrena Mylar, MD 05/04/24 [phone]

## 2024-05-04 NOTE — ED Notes (Signed)
 Pt continues to sleep. Called wife to come pick up. No answer, left message.

## 2024-05-04 NOTE — ED Notes (Signed)
Attempted to call wife but no answer.

## 2024-05-04 NOTE — ED Notes (Signed)
 Attempted to call pt's wife at [facility] time for update but she didn't answer.  Pt is asleep with equal chest rise and fall noted.  Pt is still delayed in responding with clear speech.

## 2024-05-04 NOTE — ED Notes (Addendum)
 The pt called put and stated that he was sleeping when suddenly he felt an intense pressure in his head  feels like the roof is going to come off. When asked if he heard or felt anything pop he said no.  The pt's speech was noted to still be clear but, his sentences had long pauses in between as if he couldn't find his words. The pt agreed that he felt like he was having more trouble finding his words and completing a train of thought.    MD notified of same

## 2024-05-04 NOTE — ED Notes (Signed)
 The pt states he had a fall last week where he hit his head on a pipe but did not get evaluated for same. Pt presents today alert and oriented with pin point pupils, clear speech but, delayed in responding. He is able to follow commands and answer questions approprietly   NIH score done and within defined limits.

## 2024-05-17 DIAGNOSIS — Z794 Long term (current) use of insulin: Secondary | ICD-10-CM | POA: Diagnosis not present

## 2024-05-17 DIAGNOSIS — E1159 Type 2 diabetes mellitus with other circulatory complications: Secondary | ICD-10-CM | POA: Diagnosis not present

## 2024-05-17 DIAGNOSIS — I10 Essential (primary) hypertension: Secondary | ICD-10-CM | POA: Diagnosis not present

## 2024-05-17 DIAGNOSIS — E1122 Type 2 diabetes mellitus with diabetic chronic kidney disease: Secondary | ICD-10-CM | POA: Diagnosis not present

## 2024-05-17 DIAGNOSIS — M810 Age-related osteoporosis without current pathological fracture: Secondary | ICD-10-CM | POA: Diagnosis not present

## 2024-05-17 DIAGNOSIS — E113393 Type 2 diabetes mellitus with moderate nonproliferative diabetic retinopathy without macular edema, bilateral: Secondary | ICD-10-CM | POA: Diagnosis not present

## 2024-05-17 DIAGNOSIS — E1142 Type 2 diabetes mellitus with diabetic polyneuropathy: Secondary | ICD-10-CM | POA: Diagnosis not present

## 2024-05-17 DIAGNOSIS — N183 Chronic kidney disease, stage 3 unspecified: Secondary | ICD-10-CM | POA: Diagnosis not present

## 2024-05-20 DIAGNOSIS — E119 Type 2 diabetes mellitus without complications: Secondary | ICD-10-CM | POA: Diagnosis not present

## 2024-05-20 DIAGNOSIS — S51811A Laceration without foreign body of right forearm, initial encounter: Secondary | ICD-10-CM | POA: Diagnosis not present

## 2024-05-20 DIAGNOSIS — M549 Dorsalgia, unspecified: Secondary | ICD-10-CM | POA: Diagnosis not present

## 2024-05-22 DIAGNOSIS — E1142 Type 2 diabetes mellitus with diabetic polyneuropathy: Secondary | ICD-10-CM | POA: Diagnosis not present

## 2024-05-23 DIAGNOSIS — E1122 Type 2 diabetes mellitus with diabetic chronic kidney disease: Secondary | ICD-10-CM | POA: Diagnosis not present

## 2024-05-23 DIAGNOSIS — E875 Hyperkalemia: Secondary | ICD-10-CM | POA: Diagnosis not present

## 2024-05-23 DIAGNOSIS — I129 Hypertensive chronic kidney disease with stage 1 through stage 4 chronic kidney disease, or unspecified chronic kidney disease: Secondary | ICD-10-CM | POA: Diagnosis not present

## 2024-05-23 DIAGNOSIS — R809 Proteinuria, unspecified: Secondary | ICD-10-CM | POA: Diagnosis not present

## 2024-05-23 DIAGNOSIS — D631 Anemia in chronic kidney disease: Secondary | ICD-10-CM | POA: Diagnosis not present

## 2024-05-23 DIAGNOSIS — N2581 Secondary hyperparathyroidism of renal origin: Secondary | ICD-10-CM | POA: Diagnosis not present

## 2024-05-23 DIAGNOSIS — N1832 Chronic kidney disease, stage 3b: Secondary | ICD-10-CM | POA: Diagnosis not present

## 2024-05-28 DIAGNOSIS — I129 Hypertensive chronic kidney disease with stage 1 through stage 4 chronic kidney disease, or unspecified chronic kidney disease: Secondary | ICD-10-CM | POA: Diagnosis not present

## 2024-05-28 DIAGNOSIS — R809 Proteinuria, unspecified: Secondary | ICD-10-CM | POA: Diagnosis not present

## 2024-05-28 DIAGNOSIS — N2581 Secondary hyperparathyroidism of renal origin: Secondary | ICD-10-CM | POA: Diagnosis not present

## 2024-05-28 DIAGNOSIS — D631 Anemia in chronic kidney disease: Secondary | ICD-10-CM | POA: Diagnosis not present

## 2024-05-28 DIAGNOSIS — N1832 Chronic kidney disease, stage 3b: Secondary | ICD-10-CM | POA: Diagnosis not present

## 2024-05-28 DIAGNOSIS — E1122 Type 2 diabetes mellitus with diabetic chronic kidney disease: Secondary | ICD-10-CM | POA: Diagnosis not present

## 2024-05-28 DIAGNOSIS — E875 Hyperkalemia: Secondary | ICD-10-CM | POA: Diagnosis not present

## 2024-06-04 ENCOUNTER — Encounter (INDEPENDENT_AMBULATORY_CARE_PROVIDER_SITE_OTHER): Payer: Self-pay | Admitting: Vascular Surgery

## 2024-06-04 ENCOUNTER — Ambulatory Visit (INDEPENDENT_AMBULATORY_CARE_PROVIDER_SITE_OTHER): Admitting: Vascular Surgery

## 2024-06-04 ENCOUNTER — Ambulatory Visit (INDEPENDENT_AMBULATORY_CARE_PROVIDER_SITE_OTHER)

## 2024-06-04 VITALS — BP 177/71 | HR 68 | Resp 18 | Ht 64.0 in | Wt 171.6 lb

## 2024-06-04 DIAGNOSIS — K551 Chronic vascular disorders of intestine: Secondary | ICD-10-CM

## 2024-06-04 DIAGNOSIS — E1159 Type 2 diabetes mellitus with other circulatory complications: Secondary | ICD-10-CM | POA: Diagnosis not present

## 2024-06-04 DIAGNOSIS — I6523 Occlusion and stenosis of bilateral carotid arteries: Secondary | ICD-10-CM | POA: Diagnosis not present

## 2024-06-04 DIAGNOSIS — E782 Mixed hyperlipidemia: Secondary | ICD-10-CM | POA: Diagnosis not present

## 2024-06-04 NOTE — Assessment & Plan Note (Signed)
 Quite mild.  Being checked every other year with duplex.

## 2024-06-04 NOTE — Progress Notes (Signed)
 MRN : [id number]  Patrick Turner is a 78 y.o. (06-02-1946) male who presents with chief complaint of  Chief Complaint  Patient presents with   Follow-up    Has been having stomach upset with any meal every time for the past 3 months  .  History of Present Illness: Patient returns today in follow up of his mesenteric ischemia.  He has been having a little more pain with eating.  He has not had unintentional weight loss.  He is still able to eat full meals.  He remains on aspirin , Plavix , and Lipitor  and has previously had an SMA stent.  His duplex today showed no change from his study 6 months ago with very mildly elevated velocities in the SMA stent that are almost exactly as they were earlier this year.  The celiac artery is patent.   Current Outpatient Medications  Medication Sig Dispense Refill   ascorbic acid  (VITAMIN C ) 500 MG tablet Take 500 mg by mouth daily.     aspirin  81 MG tablet Take 1 tablet (81 mg total) by mouth daily.     atorvastatin  (LIPITOR ) 80 MG tablet Take 80 mg by mouth daily.     clopidogrel  (PLAVIX ) 75 MG tablet TAKE ONE TABLET BY MOUTH EVERY DAY 90 tablet 1   donepezil  (ARICEPT ) 5 MG tablet Take 1 tablet (5 mg total) by mouth at bedtime. 30 tablet 0   escitalopram  (LEXAPRO ) 10 MG tablet Take 10 mg by mouth daily.     gabapentin  (NEURONTIN ) 400 MG capsule Take 400 mg by mouth 2 (two) times daily.     insulin  glargine (LANTUS ) 100 UNIT/ML injection Inject 11 Units into the skin daily.     metFORMIN  (GLUCOPHAGE -XR) 500 MG 24 hr tablet SMARTSIG:2.0 Tablet(s) By Mouth Twice Daily     Omega-3 Fatty Acids (FISH OIL) 1000 MG CAPS Take 1,000 mg by mouth daily.     OZEMPIC, 1 MG/DOSE, 4 MG/3ML SOPN Inject 0.5 mg into the skin once a week. (Patient taking differently: Inject 2 mg into the skin once a week.)     pantoprazole  (PROTONIX ) 40 MG tablet Take 40 mg by mouth daily.     QUEtiapine  (SEROQUEL ) 100 MG tablet Take 100 mg by mouth at bedtime.     QUEtiapine   (SEROQUEL ) 25 MG tablet Take 1 tablet (25 mg total) by mouth in the morning. 30 tablet 2   ranolazine  (RANEXA ) 1000 MG SR tablet TAKE 1 TABLET TWICE DAILY 180 tablet 3   vitamin B-12 (CYANOCOBALAMIN ) 1000 MCG tablet Take 1,000 mcg by mouth daily.     alendronate (FOSAMAX) 70 MG tablet Take 70 mg by mouth once a week. Take with a full glass of water on an empty stomach. (Patient not taking: Reported on 06/04/2024)     furosemide (LASIX) 20 MG tablet Take 20 mg by mouth daily.     iron  polysaccharides (NIFEREX) 150 MG capsule Take 150 mg by mouth daily. (Patient not taking: Reported on 06/04/2024)     OZEMPIC, 0.25 OR 0.5 MG/DOSE, 2 MG/3ML SOPN Inject 0.5 mg into the skin once a week. (Patient not taking: Reported on 06/04/2024)     valsartan  (DIOVAN ) 40 MG tablet Take 1 tablet (40 mg total) by mouth daily. (Patient not taking: Reported on 06/04/2024) 30 tablet 11   No current facility-administered medications for this visit.    Past Medical History:  Diagnosis Date   Acute on chronic renal failure 03/23/2019   Acute on chronic renal insufficiency  05/30/2016   Altered mental status    Anemia    Anxiety    Aortic atherosclerosis    Arthritis    Coronary artery disease    CRD (chronic renal disease)    Depression    Diabetes mellitus without complication (HCC)    Encephalopathy acute    GERD (gastroesophageal reflux disease)    Headache    Hypercholesteremia    Hypertension    Neuropathy    Severe obesity (HCC)    Sleep apnea    Sleep terror    per patient this year per patient     Past Surgical History:  Procedure Laterality Date   APPENDECTOMY     CIRCUMCISION     COLONOSCOPY     COLONOSCOPY WITH PROPOFOL  N/A 07/10/2018   Procedure: COLONOSCOPY WITH PROPOFOL ;  Surgeon: [name], MD;  Location[facility] ENDOSCOPY;  Service: Endoscopy;  Laterality: N/A;   COLONOSCOPY WITH PROPOFOL  N/A 04/23/2019   Procedure: COLONOSCOPY WITH PROPOFOL ;  Surgeon: [name], MD;   Location: [facility] ENDOSCOPY;  Service: Endoscopy;  Laterality: N/A;   CORONARY STENT INTERVENTION N/A 10/03/2019   Procedure: CORONARY STENT INTERVENTION;  Surgeon: Florencio Cara BIRCH, MD;  Location: [facility] INVASIVE CV LAB;  Service: Cardiovascular;  Laterality: N/A;   ESOPHAGOGASTRODUODENOSCOPY     ESOPHAGOGASTRODUODENOSCOPY (EGD) WITH PROPOFOL  N/A 04/23/2019   Procedure: ESOPHAGOGASTRODUODENOSCOPY (EGD) WITH PROPOFOL ;  Surgeon: [name], MD;  Location: [facility] ENDOSCOPY;  Service: Endoscopy;  Laterality: N/A;   INTRAMEDULLARY (IM) NAIL INTERTROCHANTERIC Left 09/17/2021   Procedure: INTRAMEDULLARY (IM) NAIL INTERTROCHANTRIC;  Surgeon: [name], MD;  Location: [facility] OR;  Service: Orthopedics;  Laterality: Left;   JOINT REPLACEMENT     KNEE ARTHROPLASTY Right 06/27/2016   Procedure: COMPUTER ASSISTED TOTAL KNEE ARTHROPLASTY;  Surgeon: [name], MD;  Location: [facility] ORS;  Service: Orthopedics;  Laterality: Right;   KNEE ARTHROSCOPY Right    LEFT HEART CATH AND CORONARY ANGIOGRAPHY N/A 10/03/2019   Procedure: Left Heart Cath and possible Coronary intervention;  Surgeon: Fernand Denyse LABOR, MD;  Location: [facility] INVASIVE CV LAB;  Service: Cardiovascular;  Laterality: N/A;   TONSILLECTOMY     VISCERAL ANGIOGRAPHY N/A 05/23/2019   Procedure: VISCERAL ANGIOGRAPHY;  Surgeon: [name], MD;  Location: [facility] INVASIVE CV LAB;  Service: Cardiovascular;  Laterality: N/A;   VISCERAL ANGIOGRAPHY N/A 11/04/2019   Procedure: VISCERAL ANGIOGRAPHY;  Surgeon: [name], MD;  Location: [facility] INVASIVE CV LAB;  Service: Cardiovascular;  Laterality: N/A;   VISCERAL ANGIOGRAPHY N/A 12/14/2021   Procedure: VISCERAL ANGIOGRAPHY;  Surgeon: [name], MD;  Location: [facility] INVASIVE CV LAB;  Service: Cardiovascular;  Laterality: N/A;     Social History   Tobacco Use   Smoking status: Never   Smokeless tobacco: Never  Vaping Use   Vaping status: Never Used  Substance Use Topics   Alcohol use: Yes     Comment: rare   Drug use: No      Family History  Problem Relation Age of Onset   Diabetes Mother    Heart disease Mother    Cancer Father    COPD Father      Allergies  Allergen Reactions   Penicillins Anaphylaxis    Has patient had a PCN reaction causing immediate rash, facial/tongue/throat swelling, SOB or lightheadedness with hypotension: Yes Has patient had a PCN reaction causing severe rash involving mucus membranes or skin necrosis: No Has patient had a PCN reaction that required hospitalization Yes Has patient had  a PCN reaction occurring within the last 10 years: No If all of the above answers are NO, then may proceed with Cephalosporin use.   Tavist [Clemastine] Swelling     REVIEW OF SYSTEMS (Negative unless checked)   Constitutional: [x] Weight loss  [] Fever  [] Chills Cardiac: [] Chest pain   [] Chest pressure   [] Palpitations   [] Shortness of breath when laying flat   [] Shortness of breath at rest   [] Shortness of breath with exertion. Vascular:  [] Pain in legs with walking   [] Pain in legs at rest   [] Pain in legs when laying flat   [] Claudication   [] Pain in feet when walking  [] Pain in feet at rest  [] Pain in feet when laying flat   [] History of DVT   [] Phlebitis   [] Swelling in legs   [] Varicose veins   [] Non-healing ulcers Pulmonary:   [] Uses home oxygen   [] Productive cough   [] Hemoptysis   [] Wheeze  [] COPD   [] Asthma Neurologic:  [] Dizziness  [] Blackouts   [] Seizures   [] History of stroke   [] History of TIA  [] Aphasia   [] Temporary blindness   [] Dysphagia   [] Weakness or numbness in arms   [x] Weakness or numbness in legs Musculoskeletal:  [x] Arthritis   [] Joint swelling   [x] Joint pain   [] Low back pain Hematologic:  [] Easy bruising  [] Easy bleeding   [] Hypercoagulable state   [] Anemic   Gastrointestinal:  [] Blood in stool   [] Vomiting blood  [x] Gastroesophageal reflux/heartburn   [x] Abdominal pain Genitourinary:  [] Chronic kidney disease   [] Difficult  urination  [] Frequent urination  [] Burning with urination   [] Hematuria Skin:  [] Rashes   [] Ulcers   [] Wounds Psychological:  [] History of anxiety   []  History of major depression.  Physical Examination  BP (!) 177/71   Pulse 68   Resp 18   Ht 5' 4 (1.626 m)   Wt 171 lb 9.6 oz (77.8 kg)   BMI 29.46 kg/m  Gen:  WD/WN, NAD Head: /AT, No temporalis wasting. Ear/Nose/Throat: Hearing grossly intact, nares w/o erythema or drainage Eyes: Conjunctiva clear. Sclera non-icteric Neck: Supple.  Trachea midline Pulmonary:  Good air movement, no use of accessory muscles.  Cardiac: RRR, no JVD Vascular:  Vessel Right Left  Radial Palpable Palpable                                   Gastrointestinal: soft, non-tender/non-distended. No guarding/reflex.  Musculoskeletal: M/S 5/5 throughout.  No deformity or atrophy. Mild BLE edema. Neurologic: Sensation grossly intact in extremities.  Symmetrical.  Speech is fluent.  Psychiatric: Judgment intact, Mood & affect appropriate for pt's clinical situation. Dermatologic: No rashes or ulcers noted.  No cellulitis or open wounds.      Labs Recent Results (from the past 2160 hours)  Troponin I (High Sensitivity)     Status: None   Collection Time: 05/04/24 12:25 AM  Result Value Ref Range   Troponin I (High Sensitivity) 5 <18 ng/L    Comment: (NOTE) Elevated high sensitivity troponin I (hsTnI) values and significant  changes across serial measurements may suggest ACS but many other  chronic and acute conditions are known to elevate hsTnI results.  Refer to the Links section for chest pain algorithms and additional  guidance. Performed at [facility], [address], [place], [place] [address]   Comprehensive metabolic panel     Status: Abnormal   Collection Time: 05/04/24 12:25 AM  Result Value Ref Range   Sodium 136 135 - 145 mmol/L   Potassium 4.8 3.5 - 5.1 mmol/L   Chloride 106 98 - 111 mmol/L   CO2 21 (L) 22 - 32  mmol/L   Glucose, Bld 216 (H) 70 - 99 mg/dL    Comment: Glucose reference range applies only to samples taken after fasting for at least 8 hours.   BUN 35 (H) 8 - 23 mg/dL   Creatinine, Ser 8.60 (H) 0.61 - 1.24 mg/dL   Calcium  8.4 (L) 8.9 - 10.3 mg/dL   Total Protein 5.9 (L) 6.5 - 8.1 g/dL   Albumin  3.5 3.5 - 5.0 g/dL   AST 20 15 - 41 U/L   ALT 12 0 - 44 U/L   Alkaline Phosphatase 69 38 - 126 U/L   Total Bilirubin 0.7 0.0 - 1.2 mg/dL   GFR, Estimated 52 (L) >60 mL/min    Comment: (NOTE) Calculated using the CKD-EPI Creatinine Equation (2021)    Anion gap 9 5 - 15    Comment: Performed at [facility], [address] [place], [place] [address]  Lactic acid, plasma     Status: None   Collection Time: 05/04/24 12:25 AM  Result Value Ref Range   Lactic Acid, Venous 1.3 0.5 - 1.9 mmol/L    Comment: Performed at [facility], [address] [place], [place] [address]  CBC with Differential     Status: Abnormal   Collection Time: 05/04/24 12:25 AM  Result Value Ref Range   WBC 6.3 4.0 - 10.5 K/uL   RBC 3.36 (L) 4.22 - 5.81 MIL/uL   Hemoglobin 10.7 (L) 13.0 - 17.0 g/dL   HCT 67.1 (L) 60.9 - 47.9 %   MCV 97.6 80.0 - 100.0 fL   MCH 31.8 26.0 - 34.0 pg   MCHC 32.6 30.0 - 36.0 g/dL   RDW 86.0 88.4 - 84.4 %   Platelets 224 150 - 400 K/uL   nRBC 0.0 0.0 - 0.2 %   Neutrophils Relative % 59 %   Neutro Abs 3.7 1.7 - 7.7 K/uL   Lymphocytes Relative 26 %   Lymphs Abs 1.6 0.7 - 4.0 K/uL   Monocytes Relative 10 %   Monocytes Absolute 0.6 0.1 - 1.0 K/uL   Eosinophils Relative 5 %   Eosinophils Absolute 0.3 0.0 - 0.5 K/uL   Basophils Relative 0 %   Basophils Absolute 0.0 0.0 - 0.1 K/uL   Immature Granulocytes 0 %   Abs Immature Granulocytes 0.02 0.00 - 0.07 K/uL    Comment: Performed at [facility], [address] [place], [place] [address]  Urinalysis, w/ Reflex to Culture (Infection Suspected) -Urine, Clean Catch     Status: Abnormal   Collection Time:  05/04/24 12:25 AM  Result Value Ref Range   Specimen Source URINE, CLEAN CATCH    Color, Urine YELLOW (A) YELLOW   APPearance CLEAR (A) CLEAR   Specific Gravity, Urine 1.021 1.005 - 1.030   pH 5.0 5.0 - 8.0   Glucose, UA NEGATIVE NEGATIVE mg/dL   Hgb urine dipstick NEGATIVE NEGATIVE   Bilirubin Urine NEGATIVE NEGATIVE   Ketones, ur NEGATIVE NEGATIVE mg/dL   Protein, ur 30 (A) NEGATIVE mg/dL   Nitrite NEGATIVE NEGATIVE   Leukocytes,Ua NEGATIVE NEGATIVE   RBC / HPF 0-5 0 - 5 RBC/hpf   WBC, UA 0-5 0 - 5 WBC/hpf    Comment:        Reflex urine culture not performed  if WBC <=10, OR if Squamous epithelial cells >5. If Squamous epithelial cells >5 suggest recollection.    Bacteria, UA NONE SEEN NONE SEEN   Squamous Epithelial / HPF 0-5 0 - 5 /HPF   Mucus PRESENT    Hyaline Casts, UA PRESENT     Comment: Performed at [facility], [address] [place], [place] [address]  TSH     Status: None   Collection Time: 05/04/24 12:25 AM  Result Value Ref Range   TSH 2.867 0.350 - 4.500 uIU/mL    Comment: Performed by a 3rd Generation assay with a functional sensitivity of <=0.01 uIU/mL. Performed at [facility], [address] [place], [place] [address]   T4, free     Status: None   Collection Time: 05/04/24 12:25 AM  Result Value Ref Range   Free T4 0.61 0.61 - 1.12 ng/dL    Comment: (NOTE) Biotin ingestion may interfere with free T4 tests. If the results are inconsistent with the TSH level, previous test results, or the clinical presentation, then consider biotin interference. If needed, order repeat testing after stopping biotin. Performed at [facility], [address] [place], [place] [address]   Magnesium      Status: None   Collection Time: 05/04/24 12:25 AM  Result Value Ref Range   Magnesium  1.8 1.7 - 2.4 mg/dL    Comment: Performed at [facility], [address] [place], [place] [address]  CK     Status: None   Collection  Time: 05/04/24 12:25 AM  Result Value Ref Range   Total CK 124 49 - 397 U/L    Comment: Performed at [facility], [address] [place], [place] [address]  Ethanol     Status: None   Collection Time: 05/04/24 12:25 AM  Result Value Ref Range   Alcohol, Ethyl (B) <15 <15 mg/dL    Comment: (NOTE) For medical purposes only. Performed at [facility], [address] [place], [place] [address]   Troponin I (High Sensitivity)     Status: None   Collection Time: 05/04/24  2:37 AM  Result Value Ref Range   Troponin I (High Sensitivity) 5 <18 ng/L    Comment: (NOTE) Elevated high sensitivity troponin I (hsTnI) values and significant  changes across serial measurements may suggest ACS but many other  chronic and acute conditions are known to elevate hsTnI results.  Refer to the Links section for chest pain algorithms and additional  guidance. Performed at [facility], [address], [place], [place] [address]     Radiology No results found.  Assessment/Plan  Bilateral carotid artery disease Quite mild.  Being checked every other year with duplex.  Chronic mesenteric ischemia His duplex today showed no change from his study 6 months ago with very mildly elevated velocities in the SMA stent that are almost exactly as they were earlier this year.  The celiac artery is patent.  I do not think intervention is likely to be of much benefit given these findings on duplex.  If his symptoms worsen, we can certainly reevaluate.  Otherwise I will plan on seeing him back in 6 months.  HTN (hypertension), benign blood pressure control important in reducing the progression of atherosclerotic disease. On appropriate oral medications.     DM type 2 with diabetic peripheral neuropathy (HCC) blood glucose control important in reducing the progression of atherosclerotic disease. Also, involved in wound healing. On appropriate medications.   HLD  (  hyperlipidemia) lipid control important in reducing the progression of atherosclerotic disease. Continue statin therapy  Selinda Gu, MD  06/04/2024 10:07 AM    This note was created with Dragon medical transcription system.  Any errors from dictation are purely unintentional

## 2024-06-04 NOTE — Assessment & Plan Note (Signed)
 His duplex today showed no change from his study 6 months ago with very mildly elevated velocities in the SMA stent that are almost exactly as they were earlier this year.  The celiac artery is patent.  I do not think intervention is likely to be of much benefit given these findings on duplex.  If his symptoms worsen, we can certainly reevaluate.  Otherwise I will plan on seeing him back in 6 months.

## 2024-06-20 ENCOUNTER — Other Ambulatory Visit

## 2024-06-21 ENCOUNTER — Other Ambulatory Visit: Payer: Self-pay

## 2024-06-21 ENCOUNTER — Emergency Department

## 2024-06-21 ENCOUNTER — Observation Stay

## 2024-06-21 ENCOUNTER — Observation Stay
Admission: EM | Admit: 2024-06-21 | Discharge: 2024-06-21 | Disposition: A | Discharge status: Home / Self Care | Attending: Hospitalist | Admitting: Hospitalist

## 2024-06-21 ENCOUNTER — Encounter: Payer: Self-pay | Admitting: Emergency Medicine

## 2024-06-21 DIAGNOSIS — I700 Atherosclerosis of aorta: Secondary | ICD-10-CM | POA: Diagnosis not present

## 2024-06-21 DIAGNOSIS — R42 Dizziness and giddiness: Secondary | ICD-10-CM | POA: Diagnosis not present

## 2024-06-21 DIAGNOSIS — K5730 Diverticulosis of large intestine without perforation or abscess without bleeding: Secondary | ICD-10-CM | POA: Diagnosis not present

## 2024-06-21 DIAGNOSIS — I2510 Atherosclerotic heart disease of native coronary artery without angina pectoris: Secondary | ICD-10-CM | POA: Insufficient documentation

## 2024-06-21 DIAGNOSIS — Z7982 Long term (current) use of aspirin: Secondary | ICD-10-CM | POA: Insufficient documentation

## 2024-06-21 DIAGNOSIS — Z79899 Other long term (current) drug therapy: Secondary | ICD-10-CM | POA: Diagnosis not present

## 2024-06-21 DIAGNOSIS — Z794 Long term (current) use of insulin: Secondary | ICD-10-CM | POA: Diagnosis not present

## 2024-06-21 DIAGNOSIS — H539 Unspecified visual disturbance: Secondary | ICD-10-CM | POA: Insufficient documentation

## 2024-06-21 DIAGNOSIS — I878 Other specified disorders of veins: Secondary | ICD-10-CM | POA: Diagnosis not present

## 2024-06-21 DIAGNOSIS — Z96653 Presence of artificial knee joint, bilateral: Secondary | ICD-10-CM | POA: Diagnosis not present

## 2024-06-21 DIAGNOSIS — H538 Other visual disturbances: Secondary | ICD-10-CM | POA: Diagnosis not present

## 2024-06-21 DIAGNOSIS — R1032 Left lower quadrant pain: Principal | ICD-10-CM | POA: Insufficient documentation

## 2024-06-21 DIAGNOSIS — I6523 Occlusion and stenosis of bilateral carotid arteries: Secondary | ICD-10-CM | POA: Diagnosis not present

## 2024-06-21 DIAGNOSIS — E119 Type 2 diabetes mellitus without complications: Secondary | ICD-10-CM | POA: Diagnosis not present

## 2024-06-21 DIAGNOSIS — R55 Syncope and collapse: Secondary | ICD-10-CM | POA: Diagnosis not present

## 2024-06-21 DIAGNOSIS — Z7984 Long term (current) use of oral hypoglycemic drugs: Secondary | ICD-10-CM | POA: Diagnosis not present

## 2024-06-21 DIAGNOSIS — R519 Headache, unspecified: Secondary | ICD-10-CM | POA: Diagnosis not present

## 2024-06-21 DIAGNOSIS — E1140 Type 2 diabetes mellitus with diabetic neuropathy, unspecified: Secondary | ICD-10-CM | POA: Insufficient documentation

## 2024-06-21 DIAGNOSIS — R29818 Other symptoms and signs involving the nervous system: Secondary | ICD-10-CM

## 2024-06-21 DIAGNOSIS — I10 Essential (primary) hypertension: Secondary | ICD-10-CM | POA: Diagnosis not present

## 2024-06-21 DIAGNOSIS — R410 Disorientation, unspecified: Secondary | ICD-10-CM | POA: Diagnosis not present

## 2024-06-21 DIAGNOSIS — Z7902 Long term (current) use of antithrombotics/antiplatelets: Secondary | ICD-10-CM | POA: Diagnosis not present

## 2024-06-21 DIAGNOSIS — J449 Chronic obstructive pulmonary disease, unspecified: Secondary | ICD-10-CM | POA: Insufficient documentation

## 2024-06-21 DIAGNOSIS — N400 Enlarged prostate without lower urinary tract symptoms: Secondary | ICD-10-CM | POA: Diagnosis not present

## 2024-06-21 LAB — COMPREHENSIVE METABOLIC PANEL WITH GFR
ALT: 12 U/L (ref 0–44)
AST: 19 U/L (ref 15–41)
Albumin: 3.6 g/dL (ref 3.5–5.0)
Alkaline Phosphatase: 68 U/L (ref 38–126)
Anion gap: 11 (ref 5–15)
BUN: 40 mg/dL — ABNORMAL HIGH (ref 8–23)
CO2: 23 mmol/L (ref 22–32)
Calcium: 8.6 mg/dL — ABNORMAL LOW (ref 8.9–10.3)
Chloride: 101 mmol/L (ref 98–111)
Creatinine, Ser: 1.93 mg/dL — ABNORMAL HIGH (ref 0.61–1.24)
GFR, Estimated: 35 mL/min — ABNORMAL LOW (ref >60)
Glucose, Bld: 265 mg/dL — ABNORMAL HIGH (ref 70–99)
Potassium: 4.3 mmol/L (ref 3.5–5.1)
Sodium: 135 mmol/L (ref 135–145)
Total Bilirubin: 0.8 mg/dL (ref 0.0–1.2)
Total Protein: 6.3 g/dL — ABNORMAL LOW (ref 6.5–8.1)

## 2024-06-21 LAB — CBC
HCT: 35.2 % — ABNORMAL LOW (ref 39.0–52.0)
Hemoglobin: 11.6 g/dL — ABNORMAL LOW (ref 13.0–17.0)
MCH: 32 pg (ref 26.0–34.0)
MCHC: 33 g/dL (ref 30.0–36.0)
MCV: 97.2 fL (ref 80.0–100.0)
Platelets: 239 K/uL (ref 150–400)
RBC: 3.62 MIL/uL — ABNORMAL LOW (ref 4.22–5.81)
RDW: 13.2 % (ref 11.5–15.5)
WBC: 7.2 K/uL (ref 4.0–10.5)
nRBC: 0 % (ref 0.0–0.2)

## 2024-06-21 LAB — LIPASE, BLOOD: Lipase: 48 U/L (ref 11–51)

## 2024-06-21 LAB — LACTIC ACID, PLASMA: Lactic Acid, Venous: 0.9 mmol/L (ref 0.5–1.9)

## 2024-06-21 LAB — URINALYSIS, ROUTINE W REFLEX MICROSCOPIC
Bacteria, UA: NONE SEEN
Bilirubin Urine: NEGATIVE
Glucose, UA: >=500 mg/dL — AB
Hgb urine dipstick: NEGATIVE
Ketones, ur: NEGATIVE mg/dL
Leukocytes,Ua: NEGATIVE
Nitrite: NEGATIVE
Protein, ur: NEGATIVE mg/dL
Specific Gravity, Urine: 1.024 (ref 1.005–1.030)
pH: 5 (ref 5.0–8.0)

## 2024-06-21 LAB — MAGNESIUM: Magnesium: 1.8 mg/dL (ref 1.7–2.4)

## 2024-06-21 LAB — TROPONIN I (HIGH SENSITIVITY): Troponin I (High Sensitivity): 6 ng/L (ref <18)

## 2024-06-21 LAB — CREATININE, SERUM
Creatinine, Ser: 1.66 mg/dL — ABNORMAL HIGH (ref 0.61–1.24)
GFR, Estimated: 42 mL/min — ABNORMAL LOW (ref >60)

## 2024-06-21 MED ORDER — QUETIAPINE FUMARATE 25 MG PO TABS: 100.0000 mg | ORAL_TABLET | Freq: Every day | ORAL | Status: DC

## 2024-06-21 MED ORDER — ASPIRIN 81 MG PO CHEW
81.0000 mg | CHEWABLE_TABLET | Freq: Every day | ORAL | Status: DC
  Administered 2024-06-21: 81 mg via ORAL
  Filled 2024-06-21: qty 1

## 2024-06-21 MED ORDER — ATORVASTATIN CALCIUM 20 MG PO TABS
80.0000 mg | ORAL_TABLET | Freq: Every day | ORAL | Status: DC
  Administered 2024-06-21: 80 mg via ORAL
  Filled 2024-06-21: qty 4

## 2024-06-21 MED ORDER — INSULIN GLARGINE 100 UNIT/ML ~~LOC~~ SOLN
11.0000 [IU] | Freq: Every day | SUBCUTANEOUS | Status: DC
  Administered 2024-06-21: 11 [IU] via SUBCUTANEOUS
  Filled 2024-06-21: qty 0.11

## 2024-06-21 MED ORDER — ESCITALOPRAM OXALATE 10 MG PO TABS
10.0000 mg | ORAL_TABLET | Freq: Every day | ORAL | Status: DC
  Administered 2024-06-21: 10 mg via ORAL
  Filled 2024-06-21: qty 1

## 2024-06-21 MED ORDER — DONEPEZIL HCL 5 MG PO TABS: 5.0000 mg | ORAL_TABLET | Freq: Every day | ORAL | Status: DC

## 2024-06-21 MED ORDER — ACETAMINOPHEN 500 MG PO TABS
1000.0000 mg | ORAL_TABLET | Freq: Once | ORAL | Status: AC
Start: 2024-06-21 — End: 2024-06-21
  Administered 2024-06-21: 1000 mg via ORAL
  Filled 2024-06-21: qty 2

## 2024-06-21 MED ORDER — SODIUM CHLORIDE 0.9 % IV BOLUS (SEPSIS)
1000.0000 mL | Freq: Once | INTRAVENOUS | Status: AC
Start: 2024-06-21 — End: 2024-06-21
  Administered 2024-06-21: 1000 mL via INTRAVENOUS

## 2024-06-21 MED ORDER — CLOPIDOGREL BISULFATE 75 MG PO TABS
75.0000 mg | ORAL_TABLET | Freq: Every day | ORAL | Status: DC
  Administered 2024-06-21: 75 mg via ORAL
  Filled 2024-06-21: qty 1

## 2024-06-21 MED ORDER — PANTOPRAZOLE SODIUM 40 MG PO TBEC
40.0000 mg | DELAYED_RELEASE_TABLET | Freq: Every day | ORAL | Status: DC
Start: 2024-06-21 — End: 2024-06-21
  Administered 2024-06-21: 40 mg via ORAL
  Filled 2024-06-21: qty 1

## 2024-06-21 MED ORDER — IOHEXOL 350 MG/ML SOLN
75.0000 mL | Freq: Once | INTRAVENOUS | Status: AC | PRN
  Administered 2024-06-21: 75 mL via INTRAVENOUS

## 2024-06-21 MED ORDER — HEPARIN SODIUM (PORCINE) 5000 UNIT/ML IJ SOLN: 5000.0000 [IU] | Freq: Three times a day (TID) | INTRAMUSCULAR | Status: DC

## 2024-06-21 NOTE — ED Notes (Signed)
 Pt provided discharge instructions and prescription information. Pt was given the opportunity to ask questions and questions were answered.

## 2024-06-21 NOTE — ED Notes (Signed)
 Fall risk bundle is currently in place.

## 2024-06-21 NOTE — ED Provider Notes (Signed)
 [facility] Provider Note    Event Date/Time   First MD Initiated Contact with Patient 06/21/24 [phone]     (approximate)   History   Abdominal Pain, Weakness, and Dizziness   HPI  Patrick Turner is a 78 y.o. male with history of coronary artery disease, COPD, hypertension, diabetes, hyperlipidemia who presents to the emergency department with complaints of left side abdominal pain that worsened today.  No nausea, vomiting, diarrhea, urinary symptoms, bloody stools or melena.  States he has had these symptoms ongoing for weeks and is followed by Dr. Marea with vascular surgery.  He is compliant with his Plavix .  States symptoms are worse after eating.  States while at work tonight around [employer] he had sudden onset severe frontal head discomfort that he describes as a burning sensation.  States it caused him to feel like he was going to pass out and he laid his head down on the counter.  States he had a flash of light and then bilateral vision loss.  He is unable to tell me how long that lasted.  He states someone helped get him a chair.  He did not lose consciousness.  No recent head injury.  No neck pain or neck stiffness.  No numbness, tingling or focal weakness but states he feels weak all over.  He is on Plavix .  Has never had a similar episode.  Still having some mild head discomfort.  No medications given prior to arrival.  Came in with EMS.   History provided by patient.    Past Medical History:  Diagnosis Date   Acute on chronic renal failure 03/23/2019   Acute on chronic renal insufficiency 05/30/2016   Altered mental status    Anemia    Anxiety    Aortic atherosclerosis    Arthritis    Coronary artery disease    CRD (chronic renal disease)    Depression    Diabetes mellitus without complication (HCC)    Encephalopathy acute    GERD (gastroesophageal reflux disease)    Headache    Hypercholesteremia    Hypertension    Neuropathy    Severe  obesity (HCC)    Sleep apnea    Sleep terror    per patient this year per patient     Past Surgical History:  Procedure Laterality Date   APPENDECTOMY     CIRCUMCISION     COLONOSCOPY     COLONOSCOPY WITH PROPOFOL  N/A 07/10/2018   Procedure: COLONOSCOPY WITH PROPOFOL ;  Surgeon: [name], MD;  Location[facility] ENDOSCOPY;  Service: Endoscopy;  Laterality: N/A;   COLONOSCOPY WITH PROPOFOL  N/A 04/23/2019   Procedure: COLONOSCOPY WITH PROPOFOL ;  Surgeon: [name], MD;  Location: [facility] ENDOSCOPY;  Service: Endoscopy;  Laterality: N/A;   CORONARY STENT INTERVENTION N/A 10/03/2019   Procedure: CORONARY STENT INTERVENTION;  Surgeon: [name], MD;  Location: [facility] INVASIVE CV LAB;  Service: Cardiovascular;  Laterality: N/A;   ESOPHAGOGASTRODUODENOSCOPY     ESOPHAGOGASTRODUODENOSCOPY (EGD) WITH PROPOFOL  N/A 04/23/2019   Procedure: ESOPHAGOGASTRODUODENOSCOPY (EGD) WITH PROPOFOL ;  Surgeon: [name], MD;  Location: [facility] ENDOSCOPY;  Service: Endoscopy;  Laterality: N/A;   INTRAMEDULLARY (IM) NAIL INTERTROCHANTERIC Left 09/17/2021   Procedure: INTRAMEDULLARY (IM) NAIL INTERTROCHANTRIC;  Surgeon: [name], MD;  Location: [facility] OR;  Service: Orthopedics;  Laterality: Left;   JOINT REPLACEMENT     KNEE ARTHROPLASTY Right 06/27/2016   Procedure: COMPUTER ASSISTED TOTAL KNEE ARTHROPLASTY;  Surgeon: Lynwood SQUIBB  Hooten, MD;  Location: [facility] ORS;  Service: Orthopedics;  Laterality: Right;   KNEE ARTHROSCOPY Right    LEFT HEART CATH AND CORONARY ANGIOGRAPHY N/A 10/03/2019   Procedure: Left Heart Cath and possible Coronary intervention;  Surgeon: Fernand Denyse LABOR, MD;  Location: [facility] INVASIVE CV LAB;  Service: Cardiovascular;  Laterality: N/A;   TONSILLECTOMY     VISCERAL ANGIOGRAPHY N/A 05/23/2019   Procedure: VISCERAL ANGIOGRAPHY;  Surgeon: [name], MD;  Location: [facility] INVASIVE CV LAB;  Service: Cardiovascular;  Laterality: N/A;   VISCERAL ANGIOGRAPHY N/A 11/04/2019    Procedure: VISCERAL ANGIOGRAPHY;  Surgeon: [name], MD;  Location: [facility] INVASIVE CV LAB;  Service: Cardiovascular;  Laterality: N/A;   VISCERAL ANGIOGRAPHY N/A 12/14/2021   Procedure: VISCERAL ANGIOGRAPHY;  Surgeon: [name], MD;  Location: [facility] INVASIVE CV LAB;  Service: Cardiovascular;  Laterality: N/A;    MEDICATIONS:  Prior to Admission medications   Medication Sig Start Date End Date Taking? Authorizing Provider  alendronate (FOSAMAX) 70 MG tablet Take 70 mg by mouth once a week. Take with a full glass of water on an empty stomach. Patient not taking: Reported on 06/04/2024    [provider]  ascorbic acid  (VITAMIN C ) 500 MG tablet Take 500 mg by mouth daily.    [provider]  aspirin  81 MG tablet Take 1 tablet (81 mg total) by mouth daily. 02/23/18   Sainani, Vivek J, MD  atorvastatin  (LIPITOR ) 80 MG tablet Take 80 mg by mouth daily.    [provider]  clopidogrel  (PLAVIX ) 75 MG tablet TAKE ONE TABLET BY MOUTH EVERY DAY 01/11/24   Fernand Denyse LABOR, MD  donepezil  (ARICEPT ) 5 MG tablet Take 1 tablet (5 mg total) by mouth at bedtime. 09/22/21   Paige, Victoria J, DO  escitalopram  (LEXAPRO ) 10 MG tablet Take 10 mg by mouth daily.    [provider]  furosemide (LASIX) 20 MG tablet Take 20 mg by mouth daily.    [provider]  gabapentin  (NEURONTIN ) 400 MG capsule Take 400 mg by mouth 2 (two) times daily.    [provider]  insulin  glargine (LANTUS ) 100 UNIT/ML injection Inject 11 Units into the skin daily.    [provider]  iron  polysaccharides (NIFEREX) 150 MG capsule Take 150 mg by mouth daily. Patient not taking: Reported on 06/04/2024    [provider]  metFORMIN  (GLUCOPHAGE -XR) 500 MG 24 hr tablet SMARTSIG:2.0 Tablet(s) By Mouth Twice Daily 07/12/23   [provider]  Omega-3 Fatty Acids (FISH OIL) 1000 MG CAPS Take 1,000 mg by mouth daily.    [provider]  OZEMPIC, 0.25 OR 0.5 MG/DOSE,  2 MG/3ML SOPN Inject 0.5 mg into the skin once a week. Patient not taking: Reported on 06/04/2024 11/11/22   [provider]  OZEMPIC, 1 MG/DOSE, 4 MG/3ML SOPN Inject 0.5 mg into the skin once a week. Patient taking differently: Inject 2 mg into the skin once a week. 09/23/23   [provider]  pantoprazole  (PROTONIX ) 40 MG tablet Take 40 mg by mouth daily.    [provider]  QUEtiapine  (SEROQUEL ) 100 MG tablet Take 100 mg by mouth at bedtime.    [provider]  QUEtiapine  (SEROQUEL ) 25 MG tablet Take 1 tablet (25 mg total) by mouth in the morning. 10/03/23   Floy Roberts, MD  ranolazine  (RANEXA ) 1000 MG SR tablet TAKE 1 TABLET TWICE DAILY 05/06/24   Fernand Denyse LABOR, MD  valsartan  (DIOVAN ) 40 MG  tablet Take 1 tablet (40 mg total) by mouth daily. Patient not taking: Reported on 06/04/2024 02/29/24 02/28/25  Fernand Denyse LABOR, MD  vitamin B-12 (CYANOCOBALAMIN ) 1000 MCG tablet Take 1,000 mcg by mouth daily.    [provider]    Physical Exam   Triage Vital Signs: ED Triage Vitals  Encounter Vitals Group     BP 06/21/24 0022 (!) 139/53     Girls Systolic BP Percentile --      Girls Diastolic BP Percentile --      Boys Systolic BP Percentile --      Boys Diastolic BP Percentile --      Pulse Rate 06/21/24 0022 (!) 56     Resp 06/21/24 0022 16     Temp 06/21/24 0022 98 F (36.7 C)     Temp Source 06/21/24 0022 Oral     SpO2 06/21/24 0022 92 %     Weight 06/21/24 0022 171 lb (77.6 kg)     Height 06/21/24 0022 5' 4 (1.626 m)     Head Circumference --      Peak Flow --      Pain Score 06/21/24 0034 4     Pain Loc --      Pain Education --      Exclude from Growth Chart --     Most recent vital signs: Vitals:   06/21/24 0530 06/21/24 0600  BP: (!) 148/61 (!) 140/52  Pulse: 61 61  Resp: 14 15  Temp:  98.2 F (36.8 C)  SpO2: 92% 94%    CONSTITUTIONAL: Alert, responds appropriately to questions.  Elderly, appears slightly pale HEAD:  Normocephalic, atraumatic EYES: Conjunctivae clear, pupils appear equal, sclera nonicteric ENT: normal nose; moist mucous membranes NECK: Supple, normal ROM, no meningismus CARD: RRR; S1 and S2 appreciated RESP: Normal chest excursion without splinting or tachypnea; breath sounds clear and equal bilaterally; no wheezes, no rhonchi, no rales, no hypoxia or respiratory distress, speaking full sentences ABD/GI: Non-distended; soft, tender in the left lower quadrant without guarding or rebound BACK: The back appears normal EXT: Normal ROM in all joints; no deformity noted, no edema SKIN: Normal color for age and race; warm; no rash on exposed skin NEURO: Moves all extremities equally, normal speech, normal sensation, no facial asymmetry PSYCH: The patient's mood and manner are appropriate.   ED Results / Procedures / Treatments   LABS: (all labs ordered are listed, but only abnormal results are displayed) Labs Reviewed  COMPREHENSIVE METABOLIC PANEL WITH GFR - Abnormal; Notable for the following components:      Result Value   Glucose, Bld 265 (*)    BUN 40 (*)    Creatinine, Ser 1.93 (*)    Calcium  8.6 (*)    Total Protein 6.3 (*)    GFR, Estimated 35 (*)    All other components within normal limits  CBC - Abnormal; Notable for the following components:   RBC 3.62 (*)    Hemoglobin 11.6 (*)    HCT 35.2 (*)    All other components within normal limits  URINALYSIS, ROUTINE W REFLEX MICROSCOPIC - Abnormal; Notable for the following components:   Color, Urine YELLOW (*)    APPearance HAZY (*)    Glucose, UA >=500 (*)    All other components within normal limits  LIPASE, BLOOD  LACTIC ACID, PLASMA  LACTIC ACID, PLASMA  TROPONIN I (HIGH SENSITIVITY)     EKG:  EKG Interpretation Date/Time:  Friday June 21 2024 00:27:31  EDT Ventricular Rate:  56 PR Interval:  166 QRS Duration:  80 QT Interval:  432 QTC Calculation: 416 R Axis:   19  Text Interpretation: Sinus  bradycardia Otherwise normal ECG When compared with ECG of 04-May-2024 00:02, PREVIOUS ECG IS PRESENT Confirmed by Neomi Neptune [phone]) on 06/21/2024 3:17:40 AM         RADIOLOGY: My personal review and interpretation of imaging: CT scans show no acute abnormality.  I have personally reviewed all radiology reports.   CT ABDOMEN PELVIS W CONTRAST Result Date: 06/21/2024 EXAM: CT ABDOMEN AND PELVIS WITH CONTRAST 06/21/2024 05:18:41 AM TECHNIQUE: CT of the abdomen and pelvis was performed with the administration of 75 mL of iohexol  (OMNIPAQUE ) 350 MG/ML injection. Multiplanar reformatted images are provided for review. Automated exposure control, iterative reconstruction, and/or weight-based adjustment of the mA/kV was utilized to reduce the radiation dose to as low as reasonably achievable. COMPARISON: 10/02/2023 CLINICAL HISTORY: LLQ abdominal pain. Table formatting from the original note was not included.; Images from the original note were not included.; Per ed notes; Pt arrives via ems from work at [employer] c/o left lower abd/side pain that has been going on all day; pt states he became lightheaded and his head felt funny and couldn't hardly stand up while at work. Denies n/v, sob, or chest pain. Pt states he feels slightly dizzy and weak. FINDINGS: LOWER CHEST: Coronary artery calcifications. LIVER: The liver is unremarkable. GALLBLADDER AND BILE DUCTS: Gallbladder is unremarkable. No biliary ductal dilatation. SPLEEN: No acute abnormality. PANCREAS: No acute abnormality. ADRENAL GLANDS: No acute abnormality. KIDNEYS, URETERS AND BLADDER: No stones in the kidneys or ureters. No hydronephrosis. No perinephric or periureteral stranding. GI AND BOWEL: Stomach demonstrates no acute abnormality. Scattered areas of mild-to-moderate stool burden noted within the colon and rectum. Status post appendectomy. Sigmoid diverticulosis. No signs of acute diverticulitis. There is no bowel obstruction.  PERITONEUM AND RETROPERITONEUM: No free fluid, fluid collections, or free air. VASCULATURE: Aorta is normal in caliber. Aortic atherosclerotic calcification. Superior mesenteric artery stent. Upper abdominal vascularity is patent. LYMPH NODES: No lymphadenopathy. 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 BONES AND SOFT TISSUES: Postoperative changes from previous ORIF of the left femur. Bones are osteopenic. Lumbar degenerative disc disease. Grade 1 anterolisthesis of L4 and L5 with stepwise retrolisthesis of L1 on L2 and L2 on L3. No focal soft tissue abnormality. IMPRESSION: 1. No acute findings in the abdomen or pelvis related to LLQ abdominal pain. 2. Sigmoid diverticulosis without evidence of diverticulitis. 3. 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 4. Aortic atherosclerotic calcification is noted Electronically signed by: Waddell Calk MD 06/21/2024 06:02 AM EDT RP Workstation: [id number]   CT ANGIO HEAD NECK W WO CM Result Date: 06/21/2024 EXAM: CTA Head and Neck with Intravenous Contrast. CT Head without Contrast. CLINICAL HISTORY: 78 year old male with severe, sudden onset headache, lightheadedness, and left lower abdominal/side pain. TECHNIQUE: Axial CTA images of the head and neck performed with intravenous contrast. MIP reconstructed images were created and reviewed. Axial computed tomography images of the head/brain performed without intravenous contrast. Note: Per PQRS, the description of internal carotid artery percent stenosis, including 0 percent or normal exam, is based on [place] American Symptomatic Carotid Endarterectomy Trial (NASCET) criteria. Dose reduction technique was used including one or more of the following: automated exposure control, adjustment of mA and kV according to patient size, and/or iterative reconstruction. CONTRAST: With  and without.  COMPARISON: . MRI 05/04/2024, head CT 05/04/2024, prior CTA head and neck 09/26/2023. FINDINGS: CT HEAD: BRAIN: . Some calcified scalp vessel atherosclerosis. No acute intraparenchymal hemorrhage. No mass lesion. No CT evidence for acute territorial infarct. No midline shift or extra-axial collection. VENTRICLES: No hydrocephalus. ORBITS: The orbits are unremarkable. SINUSES AND MASTOIDS: The paranasal sinuses and mastoid air cells are clear. CTA NECK: COMMON CAROTID ARTERIES: Generalized common carotid artery tortuosity. Mild common carotid artery calcified plaque bilaterally without stenosis. Mild carotid bifurcation calcified plaque without stenosis. No dissection or occlusion. INTERNAL CAROTID ARTERIES: No stenosis by NASCET criteria. No dissection or occlusion. VERTEBRAL ARTERIES: Tortuous proximal left vertebral artery origin. Tortuous left V1 segment. Codominant and mildly ectatic vertebral arteries are patent to the vertebrobasilar junction without stenosis. Patent PICA origins. No dissection or occlusion. SUBCLAVIAN ARTERIES: Mild proximal subclavian artery plaque without stenosis. Tortuous proximal left subclavian artery. CTA HEAD: INTRACRANIAL INTERNAL CAROTID ARTERIES: Bilateral ICA siphon moderate calcified plaque greater on the right. No hemodynamically significant ICA siphon stenosis. ANTERIOR CEREBRAL ARTERIES: ACA origins remain normal. Normal anterior communicating artery. No significant stenosis. No occlusion. No aneurysm. MIDDLE CEREBRAL ARTERIES: MCA origins remain normal. Bilateral MCA branches are stable and within normal limits. No significant stenosis. No occlusion. No aneurysm. POSTERIOR CEREBRAL ARTERIES: SCA and PCA origins remain normal. Diminutive or absent posterior communicating arteries. Bilateral PCA branches are stable, there is mild to moderate bilateral P3 segment irregularity and stenosis. No occlusion. No aneurysm. BASILAR ARTERY: Patent basilar artery without stenosis. No  occlusion. No aneurysm. OTHER: Stable 3 vessel aortic arch with mild for age aortic atherosclerosis. Major dural venous sinuses are enhancing and appear to be patent. SOFT TISSUES: No acute finding. No masses or lymphadenopathy. BONES: Advanced chronic cervical and visible upper thoracic spine degeneration appears stable. No acute osseous abnormality. IMPRESSION: 1. Stable and normal for age non-contrast CT appearance of the brain. 2. Mild for age atherosclerosis and No large vessel stenosis. Mild to moderate chronic P3 PCA branch irregularity and stenosis. Electronically signed by: Helayne Hurst MD 06/21/2024 05:53 AM EDT RP Workstation: [id number]     PROCEDURES:  Critical Care performed: No     .1-3 Lead EKG Interpretation  Performed by: Koron Godeaux, Josette SAILOR, DO Authorized by: Kanani Mowbray, Josette SAILOR, DO     Interpretation: abnormal     ECG rate:  56   ECG rate assessment: bradycardic     Rhythm: sinus bradycardia     Ectopy: none     Conduction: normal       IMPRESSION / MDM / ASSESSMENT AND PLAN / ED COURSE  I reviewed the triage vital signs and the nursing notes.    Patient here for left lower quadrant abdominal pain and headache.  The patient is on the cardiac monitor to evaluate for evidence of arrhythmia and/or significant heart rate changes.   DIFFERENTIAL DIAGNOSIS (includes but not limited to):   Diverticulitis, colitis, kidney stone, UTI, bowel obstruction, less likely appendicitis  Intracranial hemorrhage, tension headache, migraine, less likely meningitis, cavernous sinus thrombosis, stroke   Patient's presentation is most consistent with acute presentation with potential threat to life or bodily function.   PLAN: Will obtain labs, urine, CTA of the head and neck, CT of the abdomen pelvis.  Patient requesting Tylenol  for pain.   MEDICATIONS GIVEN IN ED: Medications  sodium chloride  0.9 % bolus 1,000 mL (0 mLs Intravenous Stopped 06/21/24 0613)  acetaminophen  (TYLENOL )  tablet 1,000 mg (1,000 mg Oral Given 06/21/24 0411)  iohexol  (  OMNIPAQUE ) 350 MG/ML injection 75 mL (75 mLs Intravenous Contrast Given 06/21/24 0455)     ED COURSE: Patient's labs show mild anemia which is stable and chronic compared to previous.  He does have a mild acute kidney injury superimposed on chronic kidney disease.  He is getting IV fluids for this.  Normal electrolytes, LFTs, lipase is.  Negative troponin.  CT scans reviewed and interpreted by myself and the radiologist and showed no acute abnormality.  Urine shows no sign of infection.  6:36 AM  On reevaluation patient states he is feeling better and able to ambulate, tolerate p.o. but he now describes his episode as a near syncopal event with vision changes, loss of vision rather than just a sudden onset headache.  Given he has a significant history of peripheral arterial disease, I am concerned for possible TIA.  Will obtain brain MRI and have recommended admission.  He also now describes his abdominal pain is going on for weeks where initially he told me it started today.  He says it is worse with eating.  I am concerned for possible chronic mesenteric ischemia.  Unfortunately I did not obtain a CTA of the abdomen given he did not provide this history initially.  Will check a lactic.   CONSULTS:  Consulted and discussed patient's case with hospitalist, Dr. Cleatus.  I have recommended admission and consulting physician agrees and will place admission orders.  Patient (and family if present) agree with this plan.   I reviewed all nursing notes, vitals, pertinent previous records.  All labs, EKGs, imaging ordered have been independently reviewed and interpreted by myself.    OUTSIDE RECORDS REVIEWED: Reviewed patient's recent vascular surgery note on 06/04/2024 for a mesenteric ischemia.       FINAL CLINICAL IMPRESSION(S) / ED DIAGNOSES   Final diagnoses:  LLQ abdominal pain  Near syncope  Vision changes     Rx / DC  Orders   ED Discharge Orders     None        Note:  This document was prepared using Dragon voice recognition software and may include unintentional dictation errors.   Phinley Schall, Josette SAILOR, DO 06/21/24 [phone]

## 2024-06-21 NOTE — ED Notes (Signed)
 Unsuccessful IV attempt x2.

## 2024-06-21 NOTE — Discharge Instructions (Addendum)
 You may take Tylenol  1000 mg every 6 hours as needed for pain.  Your CT scan showed no acute abnormality today.  Your lab work and urine were reassuring.  I recommend close follow-up with your primary care doctor.

## 2024-06-21 NOTE — ED Triage Notes (Signed)
 Pt to ED via EMS from work, pt is slow to repsond. Pt has hx DM. CBG 122. Pupils pinpoint but cannot recall prescribed meds 136/68 72 HR

## 2024-06-21 NOTE — ED Notes (Signed)
 Pt ambulatory to restroom with cane.

## 2024-06-21 NOTE — Discharge Summary (Signed)
 Physician Discharge Summary   Patrick Turner  male DOB: August 24, 1946  FMW:[id number]  PCP: Lenon Layman ORN, MD  Admit date: 06/21/2024 Discharge date: 06/21/2024  Admitted From: home Disposition:  home CODE STATUS: Full code  Discharge Instructions     Diet - low sodium heart healthy   Complete by: As directed       Hospital Course:  For full details, please see H&P, progress notes, consult notes and ancillary notes.  Briefly,  Patrick Turner is a 78 y.o. male with medical history significant of chronic mesenteric ischemia, HTN, DM2 who presented with dizziness while at work.  Also complained of pain after eating which is chronic.  # Dizziness --did not loose consciousness.  Workup including extensive brain imaging unremarkable.  Possibly due to dehydration as Cr was elevated.  Symptom improved after presentation.  Pt had no trouble ambulating prior to discharge.   # Post-prandial LLQ pain with hx of # Chronic mesenteric ischemia --this appears to be a chronic issue for which pt saw vascular Dr. Marea recently on 06/04/24.  Since duplex at the time showed no change, no intervention was recommended.  Pt is still able to eat his meals. --per RN, pt ate his breakfast in the ED with no difficulty.   --outpatient f/u with Dr. Marea   # AKI --CR 1.93 on presentation.  Received 1L NS.  Cr improved to 1.66 prior to discharge.  HTN --Resume home amlodipine  and coreg . --hold lasix due to AKI --pt not taking Valsartan  PTA.  DM2 --pt not taking Ozempic and Semaglutide PTA --discharged back to home Farxiga, Lantus  and metformin .   Unless noted above, medications under STOP list are ones pt was not taking PTA.  Discharge Diagnoses:  Principal Problem:   Focal neurological deficit     Discharge Instructions:  Allergies as of 06/21/2024       Reactions   Penicillins Anaphylaxis   Has patient had a PCN reaction causing immediate rash, facial/tongue/throat  swelling, SOB or lightheadedness with hypotension: Yes Has patient had a PCN reaction causing severe rash involving mucus membranes or skin necrosis: No Has patient had a PCN reaction that required hospitalization Yes Has patient had a PCN reaction occurring within the last 10 years: No If all of the above answers are NO, then may proceed with Cephalosporin use.   Tavist [clemastine] Swelling        Medication List     PAUSE taking these medications    furosemide 20 MG tablet Wait to take this until your doctor or other care provider tells you to start again. Due to acute kidney injury. Commonly known as: LASIX Take 20 mg by mouth daily.       STOP taking these medications    alendronate 70 MG tablet Commonly known as: FOSAMAX   iron  polysaccharides 150 MG capsule Commonly known as: NIFEREX   Ozempic (0.25 or 0.5 MG/DOSE) 2 MG/3ML Sopn Generic drug: Semaglutide(0.25 or 0.5MG /DOS)   Ozempic (1 MG/DOSE) 4 MG/3ML Sopn Generic drug: Semaglutide (1 MG/DOSE)   Semaglutide (2 MG/DOSE) 8 MG/3ML Sopn   valsartan  40 MG tablet Commonly known as: Diovan        TAKE these medications    amLODipine  10 MG tablet Commonly known as: NORVASC  Take 10 mg by mouth daily.   ascorbic acid  500 MG tablet Commonly known as: VITAMIN C  Take 500 mg by mouth daily.   aspirin  81 MG tablet Take 1 tablet (81 mg total) by mouth daily.  atorvastatin  80 MG tablet Commonly known as: LIPITOR  Take 80 mg by mouth daily.   carvedilol  3.125 MG tablet Commonly known as: COREG  Take 3.125 mg by mouth 2 (two) times daily.   clopidogrel  75 MG tablet Commonly known as: PLAVIX  TAKE ONE TABLET BY MOUTH EVERY DAY   cyanocobalamin  1000 MCG tablet Commonly known as: VITAMIN B12 Take 1,000 mcg by mouth daily.   dapagliflozin propanediol 10 MG Tabs tablet Commonly known as: FARXIGA Take 10 mg by mouth daily.   donepezil  5 MG tablet Commonly known as: ARICEPT  Take 1 tablet (5 mg total) by  mouth at bedtime. What changed: Another medication with the same name was removed. Continue taking this medication, and follow the directions you see here.   escitalopram  10 MG tablet Commonly known as: LEXAPRO  Take 10 mg by mouth daily.   Fish Oil 1000 MG Caps Take 1,000 mg by mouth daily.   gabapentin  400 MG capsule Commonly known as: NEURONTIN  Take 400 mg by mouth 2 (two) times daily.   insulin  glargine 100 UNIT/ML injection Commonly known as: LANTUS  Inject 11 Units into the skin daily.   metFORMIN  500 MG 24 hr tablet Commonly known as: GLUCOPHAGE -XR SMARTSIG:2.0 Tablet(s) By Mouth Twice Daily   pantoprazole  40 MG tablet Commonly known as: PROTONIX  Take 40 mg by mouth daily.   QUEtiapine  100 MG tablet Commonly known as: SEROQUEL  Take 100 mg by mouth at bedtime.   QUEtiapine  25 MG tablet Commonly known as: SEROquel  Take 1 tablet (25 mg total) by mouth in the morning.   ranolazine  1000 MG SR tablet Commonly known as: RANEXA  TAKE 1 TABLET TWICE DAILY         Follow-up Information     Lenon Layman ORN, MD. Schedule an appointment as soon as possible for a visit in 1 week(s).   Specialty: Internal Medicine Contact information: [address] [facility] [place] [address] [phone]         [name], MD Follow up in 2 week(s).   Specialties: Vascular Surgery, Radiology, Interventional Cardiology Contact information: [address] [place] [address] [phone]                 Allergies  Allergen Reactions   Penicillins Anaphylaxis    Has patient had a PCN reaction causing immediate rash, facial/tongue/throat swelling, SOB or lightheadedness with hypotension: Yes Has patient had a PCN reaction causing severe rash involving mucus membranes or skin necrosis: No Has patient had a PCN reaction that required hospitalization Yes Has patient had a PCN reaction occurring within the last 10 years:  No If all of the above answers are NO, then may proceed with Cephalosporin use.   Tavist [Clemastine] Swelling     The results of significant diagnostics from this hospitalization (including imaging, microbiology, ancillary and laboratory) are listed below for reference.   Consultations:   Procedures/Studies: MR BRAIN WO CONTRAST Result Date: 06/21/2024 EXAM: MR Brain without Intravenous Contrast. CLINICAL HISTORY: 78 year old male with acute neuro deficit, stroke suspected, slow to respond, history of DM. TECHNIQUE: Magnetic resonance images of the brain without intravenous contrast in multiple planes. CONTRAST: Without. COMPARISON: CT head and CTA head and neck earlier today. Brain MRI 05/04/2024. FINDINGS: BRAIN: No restricted diffusion to indicate acute infarction. No intracranial mass or hemorrhage. No midline shift or extra-axial fluid collection. No cerebellar tonsillar ectopia. The central arterial and venous flow voids are patent. Stable major vascular phleboliths from the 05/04/2024 MRI.  Cerebral volume remains normal for age. Stable gray and white matter signal since 05/04/2024. 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 Chronic small lacunar infarct right posterior limb external capsule. Mild for age white matter signal changes otherwise. No cortical and subchondral encephalomalacia. No chronic cerebral blood products. VENTRICLES: No hydrocephalus. ORBITS: The orbits are normal. SINUSES AND MASTOIDS: The sinuses and mastoid air cells are clear. BONES: No acute fracture or focal osseous lesion. Stable visible chronic cervical spine disc and endplate degeneration at C4-C5. IMPRESSION: 1. No acute intracranial abnormality. 2. 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 Electronically signed by: Helayne Hurst MD 06/21/2024 08:02 AM EDT RP Workstation: [id number]   CT ABDOMEN PELVIS W CONTRAST Result Date: 06/21/2024 EXAM: CT ABDOMEN AND PELVIS WITH CONTRAST  06/21/2024 05:18:41 AM TECHNIQUE: CT of the abdomen and pelvis was performed with the administration of 75 mL of iohexol  (OMNIPAQUE ) 350 MG/ML injection. Multiplanar reformatted images are provided for review. Automated exposure control, iterative reconstruction, and/or weight-based adjustment of the mA/kV was utilized to reduce the radiation dose to as low as reasonably achievable. COMPARISON: 10/02/2023 CLINICAL HISTORY: LLQ abdominal pain. Table formatting from the original note was not included.; Images from the original note were not included.; Per ed notes; Pt arrives via ems from work at [employer] c/o left lower abd/side pain that has been going on all day; pt states he became lightheaded and his head felt funny and couldn't hardly stand up while at work. Denies n/v, sob, or chest pain. Pt states he feels slightly dizzy and weak. FINDINGS: LOWER CHEST: Coronary artery calcifications. LIVER: The liver is unremarkable. GALLBLADDER AND BILE DUCTS: Gallbladder is unremarkable. No biliary ductal dilatation. SPLEEN: No acute abnormality. PANCREAS: No acute abnormality. ADRENAL GLANDS: No acute abnormality. KIDNEYS, URETERS AND BLADDER: No stones in the kidneys or ureters. No hydronephrosis. No perinephric or periureteral stranding. GI AND BOWEL: Stomach demonstrates no acute abnormality. Scattered areas of mild-to-moderate stool burden noted within the colon and rectum. Status post appendectomy. Sigmoid diverticulosis. No signs of acute diverticulitis. There is no bowel obstruction. PERITONEUM AND RETROPERITONEUM: No free fluid, fluid collections, or free air. VASCULATURE: Aorta is normal in caliber. Aortic atherosclerotic calcification. Superior mesenteric artery stent. Upper abdominal vascularity is patent. LYMPH NODES: No lymphadenopathy. 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 BONES AND SOFT TISSUES: Postoperative changes from  previous ORIF of the left femur. Bones are osteopenic. Lumbar degenerative disc disease. Grade 1 anterolisthesis of L4 and L5 with stepwise retrolisthesis of L1 on L2 and L2 on L3. No focal soft tissue abnormality. IMPRESSION: 1. No acute findings in the abdomen or pelvis related to LLQ abdominal pain. 2. Sigmoid diverticulosis without evidence of diverticulitis. 3. 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 4. Aortic atherosclerotic calcification is noted Electronically signed by: Waddell Calk MD 06/21/2024 06:02 AM EDT RP Workstation: [id number]   CT ANGIO HEAD NECK W WO CM Result Date: 06/21/2024 EXAM: CTA Head and Neck with Intravenous Contrast. CT Head without Contrast. CLINICAL HISTORY: 78 year old male with severe, sudden onset headache, lightheadedness, and left lower abdominal/side pain. TECHNIQUE: Axial CTA images of the head and neck performed with intravenous contrast. MIP reconstructed images were created and reviewed. Axial computed tomography images of the head/brain performed without intravenous contrast. Note: Per PQRS, the description of internal carotid artery percent stenosis, including 0 percent or normal exam, is based on [place] American Symptomatic Carotid  Endarterectomy Trial (NASCET) criteria. Dose reduction technique was used including one or more of the following: automated exposure control, adjustment of mA and kV according to patient size, and/or iterative reconstruction. CONTRAST: With and without. COMPARISON: . MRI 05/04/2024, head CT 05/04/2024, prior CTA head and neck 09/26/2023. FINDINGS: CT HEAD: BRAIN: . Some calcified scalp vessel atherosclerosis. No acute intraparenchymal hemorrhage. No mass lesion. No CT evidence for acute territorial infarct. No midline shift or extra-axial collection. VENTRICLES: No hydrocephalus. ORBITS: The orbits are unremarkable. SINUSES AND MASTOIDS: The paranasal sinuses and mastoid air cells are  clear. CTA NECK: COMMON CAROTID ARTERIES: Generalized common carotid artery tortuosity. Mild common carotid artery calcified plaque bilaterally without stenosis. Mild carotid bifurcation calcified plaque without stenosis. No dissection or occlusion. INTERNAL CAROTID ARTERIES: No stenosis by NASCET criteria. No dissection or occlusion. VERTEBRAL ARTERIES: Tortuous proximal left vertebral artery origin. Tortuous left V1 segment. Codominant and mildly ectatic vertebral arteries are patent to the vertebrobasilar junction without stenosis. Patent PICA origins. No dissection or occlusion. SUBCLAVIAN ARTERIES: Mild proximal subclavian artery plaque without stenosis. Tortuous proximal left subclavian artery. CTA HEAD: INTRACRANIAL INTERNAL CAROTID ARTERIES: Bilateral ICA siphon moderate calcified plaque greater on the right. No hemodynamically significant ICA siphon stenosis. ANTERIOR CEREBRAL ARTERIES: ACA origins remain normal. Normal anterior communicating artery. No significant stenosis. No occlusion. No aneurysm. MIDDLE CEREBRAL ARTERIES: MCA origins remain normal. Bilateral MCA branches are stable and within normal limits. No significant stenosis. No occlusion. No aneurysm. POSTERIOR CEREBRAL ARTERIES: SCA and PCA origins remain normal. Diminutive or absent posterior communicating arteries. Bilateral PCA branches are stable, there is mild to moderate bilateral P3 segment irregularity and stenosis. No occlusion. No aneurysm. BASILAR ARTERY: Patent basilar artery without stenosis. No occlusion. No aneurysm. OTHER: Stable 3 vessel aortic arch with mild for age aortic atherosclerosis. Major dural venous sinuses are enhancing and appear to be patent. SOFT TISSUES: No acute finding. No masses or lymphadenopathy. BONES: Advanced chronic cervical and visible upper thoracic spine degeneration appears stable. No acute osseous abnormality. IMPRESSION: 1. Stable and normal for age non-contrast CT appearance of the brain. 2. Mild  for age atherosclerosis and No large vessel stenosis. Mild to moderate chronic P3 PCA branch irregularity and stenosis. Electronically signed by: Helayne Hurst MD 06/21/2024 05:53 AM EDT RP Workstation: [id number]   VAS US  MESENTERIC Result Date: 06/07/2024 ABDOMINAL VISCERAL Patient Name:  Patrick Turner  Date of Exam:   06/04/2024 Medical Rec #: [id number]         Accession #:    [id number] Date of Birth: 06/12/1946         Patient Gender: M Patient Age:   26 years Exam Location:  [facility] Procedure:      VAS US  MESENTERIC Referring Phys: [id number] SELINDA RAMAN DEW -------------------------------------------------------------------------------- Indications: S/P Stent and PTA High Risk Factors: Hypertension. Vascular Interventions: 12/14/2021 PTA of previous SMA stent                          11/04/2019 PTA of celiac artery. Comparison Study: 11/28/2023 Performing Technologist: Leafy Gibes RVS  Examination Guidelines: A complete evaluation includes B-mode imaging, spectral Doppler, color Doppler, and power Doppler as needed of all accessible portions of each vessel. Bilateral testing is considered an integral part of a complete examination. Limited examinations for reoccurring indications may be performed as noted.  Duplex Findings: +----------------------+--------+--------+------+--------+ Mesenteric            PSV cm/sEDV cm/sPlaqueComments +----------------------+--------+--------+------+--------+ Aorta Prox  57      10                  +----------------------+--------+--------+------+--------+ Aorta Mid                57      0                   +----------------------+--------+--------+------+--------+ Aorta Distal             68      0                   +----------------------+--------+--------+------+--------+ Celiac Artery Origin    171                          +----------------------+--------+--------+------+--------+ Celiac Artery Proximal  152       24                  +----------------------+--------+--------+------+--------+ SMA Origin              148      19                  +----------------------+--------+--------+------+--------+ SMA Proximal            226      15                  +----------------------+--------+--------+------+--------+ SMA Mid                 277      28                  +----------------------+--------+--------+------+--------+ SMA Distal              111      17                  +----------------------+--------+--------+------+--------+ CHA                      57      11                  +----------------------+--------+--------+------+--------+ Splenic                  58      17                  +----------------------+--------+--------+------+--------+    Summary: Largest Aortic Diameter: 2.0 cm  Mesenteric: Normal Celiac artery , Splenic artery and Hepatic artery findings. 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  *See table(s) above for measurements and observations.  Diagnosing physician: Selinda Gu MD  Electronically signed by Selinda Gu MD on 06/07/2024 at 8:22:52 AM.    Final       Labs: BNP (last 3 results) No results for input(s): BNP in the last 8760 hours. Basic Metabolic Panel: Recent Labs  Lab 06/21/24 0057 06/21/24 1035  NA 135  --   K 4.3  --   CL 101  --   CO2 23  --   GLUCOSE 265*  --   BUN 40*  --   CREATININE 1.93* 1.66*  CALCIUM  8.6*  --   MG  --  1.8   Liver Function Tests: Recent Labs  Lab 06/21/24 0057  AST 19  ALT 12  ALKPHOS 68  BILITOT 0.8  PROT 6.3*  ALBUMIN  3.6   Recent Labs  Lab 06/21/24 0057  LIPASE 48   No results for input(s): AMMONIA in the last 168 hours. CBC: Recent Labs  Lab 06/21/24 0057  WBC 7.2  HGB 11.6*  HCT 35.2*  MCV 97.2  PLT 239   Cardiac Enzymes: No results for input(s): CKTOTAL, CKMB, CKMBINDEX, TROPONINI in the last 168 hours. BNP: Invalid input(s): POCBNP CBG: No  results for input(s): GLUCAP in the last 168 hours. D-Dimer No results for input(s): DDIMER in the last 72 hours. Hgb A1c No results for input(s): HGBA1C in the last 72 hours. Lipid Profile No results for input(s): CHOL, HDL, LDLCALC, TRIG, CHOLHDL, LDLDIRECT in the last 72 hours. Thyroid  function studies No results for input(s): TSH, T4TOTAL, T3FREE, THYROIDAB in the last 72 hours.  Invalid input(s): FREET3 Anemia work up No results for input(s): VITAMINB12, FOLATE, FERRITIN, TIBC, IRON , RETICCTPCT in the last 72 hours. Urinalysis    Component Value Date/Time   COLORURINE YELLOW (A) 06/21/2024 0445   APPEARANCEUR HAZY (A) 06/21/2024 0445   APPEARANCEUR Clear 11/23/2012 0629   LABSPEC 1.024 06/21/2024 0445   LABSPEC 1.018 11/23/2012 0629   PHURINE 5.0 06/21/2024 0445   GLUCOSEU >=500 (A) 06/21/2024 0445   GLUCOSEU Negative 11/23/2012 0629   HGBUR NEGATIVE 06/21/2024 0445   BILIRUBINUR NEGATIVE 06/21/2024 0445   BILIRUBINUR Negative 11/23/2012 0629   KETONESUR NEGATIVE 06/21/2024 0445   PROTEINUR NEGATIVE 06/21/2024 0445   NITRITE NEGATIVE 06/21/2024 0445   LEUKOCYTESUR NEGATIVE 06/21/2024 0445   LEUKOCYTESUR Negative 11/23/2012 0629   Sepsis Labs Recent Labs  Lab 06/21/24 0057  WBC 7.2   Microbiology No results found for this or any previous visit (from the past 240 hours).   Total time spend on discharging this patient, including the last patient exam, discussing the hospital stay, instructions for ongoing care as it relates to all pertinent caregivers, as well as preparing the medical discharge records, prescriptions, and/or referrals as applicable, is 20 minutes.    Ellouise Haber, MD  [facility] 06/21/2024, 11:19 AM

## 2024-06-21 NOTE — H&P (Signed)
 History and Physical    Patrick Turner FMW:[id number] DOB: [birth year] DOA: 06/21/2024  PCP: Lenon Layman ORN, MD  Patient coming from: home  I have personally briefly reviewed patient's old medical records in [facility]  Chief Complaint: dizziness  HPI: Patrick Turner is a 78 y.o. male with medical history significant of chronic mesenteric ischemia, HTN, DM2 who presented with dizziness while at work.  Pt reported while at work at [employer], ht started feeling hot in his forehead, saw flushes of light, and slumped over on the counter.  Did not loose consciousness.  Pt complained of pain over LLQ after eating.  Pt has chronic mesenteric ischemia and saw vascular Dr. Marea recently on 06/04/24.  ED Course: afebrile, pulse 56, BP 139/53, RR 16, sating 92% on room air.  Labs notable for Cr 1.93.  CTA head/neck no large vessel stenosis.  MRI brain neg.  CT a/p w contrast no acute finding.  ED provider requested admission.  Pt as placed on observation.  Assessment/Plan  # Dizziness --did not loose consciousness.  Workup including extensive brain imaging unremarkable.  Possibly due to dehydration as Cr was elevated. --hydration.  Monitor.  # Post-prandial LLQ pain with hx of # Chronic mesenteric ischemia --this appears to be a chronic issue for which pt saw vascular Dr. Marea recently on 06/04/24.  Since duplex at the time showed no change, no intervention was recommended.  Pt is still able to eat his meals. --outpatient f/u with Dr. Marea  # AKI --CR 1.93 on presentation.  Received 1L NS.   DVT prophylaxis: Heparin  SQ Code Status: Full code  Family Communication:   Disposition Plan: home  Consults called: none Level of care: Telemetry Medical   Review of Systems: As per HPI otherwise complete review of systems negative.   Past Medical History:  Diagnosis Date   Acute on chronic renal failure 03/23/2019   Acute on chronic renal insufficiency 05/30/2016   Altered mental  status    Anemia    Anxiety    Aortic atherosclerosis    Arthritis    Coronary artery disease    CRD (chronic renal disease)    Depression    Diabetes mellitus without complication (HCC)    Encephalopathy acute    GERD (gastroesophageal reflux disease)    Headache    Hypercholesteremia    Hypertension    Neuropathy    Severe obesity (HCC)    Sleep apnea    Sleep terror    per patient this year per patient     Past Surgical History:  Procedure Laterality Date   APPENDECTOMY     CIRCUMCISION     COLONOSCOPY     COLONOSCOPY WITH PROPOFOL  N/A 07/10/2018   Procedure: COLONOSCOPY WITH PROPOFOL ;  Surgeon: [name], MD;  Location[facility] ENDOSCOPY;  Service: Endoscopy;  Laterality: N/A;   COLONOSCOPY WITH PROPOFOL  N/A 04/23/2019   Procedure: COLONOSCOPY WITH PROPOFOL ;  Surgeon: [name], MD;  Location: [facility] ENDOSCOPY;  Service: Endoscopy;  Laterality: N/A;   CORONARY STENT INTERVENTION N/A 10/03/2019   Procedure: CORONARY STENT INTERVENTION;  Surgeon: [name], MD;  Location: [facility] INVASIVE CV LAB;  Service: Cardiovascular;  Laterality: N/A;   ESOPHAGOGASTRODUODENOSCOPY     ESOPHAGOGASTRODUODENOSCOPY (EGD) WITH PROPOFOL  N/A 04/23/2019   Procedure: ESOPHAGOGASTRODUODENOSCOPY (EGD) WITH PROPOFOL ;  Surgeon: [name], MD;  Location: [facility] ENDOSCOPY;  Service: Endoscopy;  Laterality: N/A;   INTRAMEDULLARY (IM) NAIL INTERTROCHANTERIC Left 09/17/2021   Procedure: INTRAMEDULLARY (IM)  NAIL INTERTROCHANTRIC;  Surgeon: [name], MD;  Location: [facility] OR;  Service: Orthopedics;  Laterality: Left;   JOINT REPLACEMENT     KNEE ARTHROPLASTY Right 06/27/2016   Procedure: COMPUTER ASSISTED TOTAL KNEE ARTHROPLASTY;  Surgeon: [name], MD;  Location: [facility] ORS;  Service: Orthopedics;  Laterality: Right;   KNEE ARTHROSCOPY Right    LEFT HEART CATH AND CORONARY ANGIOGRAPHY N/A 10/03/2019   Procedure: Left Heart Cath and possible Coronary intervention;  Surgeon:  Fernand Denyse LABOR, MD;  Location: [facility] INVASIVE CV LAB;  Service: Cardiovascular;  Laterality: N/A;   TONSILLECTOMY     VISCERAL ANGIOGRAPHY N/A 05/23/2019   Procedure: VISCERAL ANGIOGRAPHY;  Surgeon: [name], MD;  Location: [facility] INVASIVE CV LAB;  Service: Cardiovascular;  Laterality: N/A;   VISCERAL ANGIOGRAPHY N/A 11/04/2019   Procedure: VISCERAL ANGIOGRAPHY;  Surgeon: [name], MD;  Location: [facility] INVASIVE CV LAB;  Service: Cardiovascular;  Laterality: N/A;   VISCERAL ANGIOGRAPHY N/A 12/14/2021   Procedure: VISCERAL ANGIOGRAPHY;  Surgeon: [name], MD;  Location: [facility] INVASIVE CV LAB;  Service: Cardiovascular;  Laterality: N/A;     reports that he has never smoked. He has never used smokeless tobacco. He reports current alcohol use. He reports that he does not use drugs.  Allergies  Allergen Reactions   Penicillins Anaphylaxis    Has patient had a PCN reaction causing immediate rash, facial/tongue/throat swelling, SOB or lightheadedness with hypotension: Yes Has patient had a PCN reaction causing severe rash involving mucus membranes or skin necrosis: No Has patient had a PCN reaction that required hospitalization Yes Has patient had a PCN reaction occurring within the last 10 years: No If all of the above answers are NO, then may proceed with Cephalosporin use.   Tavist [Clemastine] Swelling    Family History  Problem Relation Age of Onset   Diabetes Mother    Heart disease Mother    Cancer Father    COPD Father     Prior to Admission medications   Medication Sig Start Date End Date Taking? Authorizing Provider  alendronate (FOSAMAX) 70 MG tablet Take 70 mg by mouth once a week. Take with a full glass of water on an empty stomach. Patient not taking: Reported on 06/04/2024    [provider]  ascorbic acid  (VITAMIN C ) 500 MG tablet Take 500 mg by mouth daily.    [provider]  aspirin  81 MG tablet Take 1 tablet (81 mg total) by mouth daily. 02/23/18    Sainani, Vivek J, MD  atorvastatin  (LIPITOR ) 80 MG tablet Take 80 mg by mouth daily.    [provider]  clopidogrel  (PLAVIX ) 75 MG tablet TAKE ONE TABLET BY MOUTH EVERY DAY 01/11/24   Fernand Denyse LABOR, MD  donepezil  (ARICEPT ) 5 MG tablet Take 1 tablet (5 mg total) by mouth at bedtime. 09/22/21   Paige, Victoria J, DO  escitalopram  (LEXAPRO ) 10 MG tablet Take 10 mg by mouth daily.    [provider]  furosemide (LASIX) 20 MG tablet Take 20 mg by mouth daily.    [provider]  gabapentin  (NEURONTIN ) 400 MG capsule Take 400 mg by mouth 2 (two) times daily.    [provider]  insulin  glargine (LANTUS ) 100 UNIT/ML injection Inject 11 Units into the skin daily.    [provider]  iron  polysaccharides (NIFEREX) 150 MG capsule Take 150 mg by mouth daily. Patient not taking: Reported on 06/04/2024    [provider]  metFORMIN  (GLUCOPHAGE -XR) 500 MG 24 hr tablet SMARTSIG:2.0 Tablet(s) By Mouth Twice Daily 07/12/23   [provider]  Omega-3 Fatty Acids (FISH OIL) 1000 MG CAPS Take 1,000 mg by mouth daily.    [provider]  OZEMPIC, 0.25 OR 0.5 MG/DOSE, 2 MG/3ML SOPN Inject 0.5 mg into the skin once a week. Patient not taking: Reported on 06/04/2024 11/11/22   [provider]  OZEMPIC, 1 MG/DOSE, 4 MG/3ML SOPN Inject 0.5 mg into the skin once a week. Patient taking differently: Inject 2 mg into the skin once a week. 09/23/23   [provider]  pantoprazole  (PROTONIX ) 40 MG tablet Take 40 mg by mouth daily.    [provider]  QUEtiapine  (SEROQUEL ) 100 MG tablet Take 100 mg by mouth at bedtime.    [provider]  QUEtiapine  (SEROQUEL ) 25 MG tablet Take 1 tablet (25 mg total) by mouth in the morning. 10/03/23   Floy Roberts, MD  ranolazine  (RANEXA ) 1000 MG SR tablet TAKE 1 TABLET TWICE DAILY 05/06/24   Fernand Denyse LABOR, MD  valsartan  (DIOVAN ) 40 MG tablet Take 1 tablet (40 mg total) by mouth  daily. Patient not taking: Reported on 06/04/2024 02/29/24 02/28/25  Fernand Denyse LABOR, MD  vitamin B-12 (CYANOCOBALAMIN ) 1000 MCG tablet Take 1,000 mcg by mouth daily.    [provider]    Physical Exam: Vitals:   06/21/24 0034 06/21/24 0330 06/21/24 0530 06/21/24 0600  BP:  (!) 150/60 (!) 148/61 (!) 140/52  Pulse:  (!) 58 61 61  Resp:  16 14 15   Temp:    98.2 F (36.8 C)  TempSrc:      SpO2: 92% 96% 92% 94%  Weight:      Height:        Constitutional: NAD, AAOx3 HEENT: conjunctivae and lids normal, EOMI CV: No cyanosis.   RESP: normal respiratory effort, on RA Neuro: II - XII grossly intact.   Psych: Normal mood and affect.  Appropriate judgement and reason  Labs on Admission: I have personally reviewed labs and imaging studies  Time spent: 60 minutes  Ellouise Haber MD [facility]  If 7PM-7AM, please contact night-coverage 06/21/2024, 8:00 AM

## 2024-06-21 NOTE — ED Notes (Signed)
 Pt sitting on the side of his bed eating his breakfast without difficulty

## 2024-06-21 NOTE — ED Notes (Signed)
 Pt to CT

## 2024-06-21 NOTE — ED Triage Notes (Addendum)
 Pt arrives via ems from work at [employer] c/o left lower abd/side pain that has been going on all day; pt states he became lightheaded and his head felt funny and couldn't hardly stand up while at work. Denies n/v, sob, or chest pain. Pt states he feels slightly dizzy and weak. Pt reports BM 30 minutes pta that did not relieve his abd pain. Pt is alert and oriented x4 in triage. Speech is slow and pupils noted to be pinpointed. NADN.

## 2024-06-24 ENCOUNTER — Ambulatory Visit: Admitting: Cardiovascular Disease

## 2024-06-25 DIAGNOSIS — F332 Major depressive disorder, recurrent severe without psychotic features: Secondary | ICD-10-CM | POA: Diagnosis not present

## 2024-06-25 DIAGNOSIS — F02A2 Dementia in other diseases classified elsewhere, mild, with psychotic disturbance: Secondary | ICD-10-CM | POA: Diagnosis not present

## 2024-06-25 DIAGNOSIS — F418 Other specified anxiety disorders: Secondary | ICD-10-CM | POA: Diagnosis not present

## 2024-06-26 ENCOUNTER — Ambulatory Visit (INDEPENDENT_AMBULATORY_CARE_PROVIDER_SITE_OTHER)

## 2024-06-26 DIAGNOSIS — N2889 Other specified disorders of kidney and ureter: Secondary | ICD-10-CM

## 2024-06-26 DIAGNOSIS — I2510 Atherosclerotic heart disease of native coronary artery without angina pectoris: Secondary | ICD-10-CM

## 2024-06-26 DIAGNOSIS — I6523 Occlusion and stenosis of bilateral carotid arteries: Secondary | ICD-10-CM

## 2024-06-26 DIAGNOSIS — R55 Syncope and collapse: Secondary | ICD-10-CM

## 2024-06-26 DIAGNOSIS — I350 Nonrheumatic aortic (valve) stenosis: Secondary | ICD-10-CM

## 2024-06-26 DIAGNOSIS — E782 Mixed hyperlipidemia: Secondary | ICD-10-CM

## 2024-06-26 DIAGNOSIS — R001 Bradycardia, unspecified: Secondary | ICD-10-CM

## 2024-06-27 DIAGNOSIS — E1122 Type 2 diabetes mellitus with diabetic chronic kidney disease: Secondary | ICD-10-CM | POA: Diagnosis not present

## 2024-06-27 DIAGNOSIS — Z794 Long term (current) use of insulin: Secondary | ICD-10-CM | POA: Diagnosis not present

## 2024-06-27 DIAGNOSIS — M810 Age-related osteoporosis without current pathological fracture: Secondary | ICD-10-CM | POA: Diagnosis not present

## 2024-06-27 DIAGNOSIS — E1142 Type 2 diabetes mellitus with diabetic polyneuropathy: Secondary | ICD-10-CM | POA: Diagnosis not present

## 2024-06-27 DIAGNOSIS — I10 Essential (primary) hypertension: Secondary | ICD-10-CM | POA: Diagnosis not present

## 2024-06-27 DIAGNOSIS — E1159 Type 2 diabetes mellitus with other circulatory complications: Secondary | ICD-10-CM | POA: Diagnosis not present

## 2024-06-27 DIAGNOSIS — N183 Chronic kidney disease, stage 3 unspecified: Secondary | ICD-10-CM | POA: Diagnosis not present

## 2024-06-27 DIAGNOSIS — E113393 Type 2 diabetes mellitus with moderate nonproliferative diabetic retinopathy without macular edema, bilateral: Secondary | ICD-10-CM | POA: Diagnosis not present

## 2024-06-27 DIAGNOSIS — R296 Repeated falls: Secondary | ICD-10-CM | POA: Diagnosis not present

## 2024-06-27 NOTE — Progress Notes (Signed)
 Patrick BIRCH Milanes Sr. is a 78 y.o. male seen in follow up.  He was last seen on 05/17/2024. He returns today to talk about my gabapentin .  1. Type 2 diabetes mellitus. His Hb A1c was 7.7% in 05/2024. Diabetes medications include Lantus  insulin  11 units qhs, Ozempic 1 mg weekly, and metformin  1000 mg BID. Also takes Farxiga for DM2 + CKD. He tolerates these medications and denies N/V/abd pain.  2. Diabetes complications. 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 He now follows with Dr. Lateef for CKD.  He has diabetic retinopathy and had laser eye surgery as well as intravitreal injections at [facility] in [place]. He had an eye exam in 10/2023 at [facility]. Peripheral neuropathy manifest as numbness only feet in lower legs. He is taking gabapentin  400 mg BID for neuropathy pain. He reports his psychiatrist has recommend he reduce his dose of gabapentin .    3. Recurrent falls. He was in the ER on 06/21/2024 after a syncopal episode. Evaluation included CT head and CT A/P which were unremarkable. He reports his blood glucose was normal at that time.   4. Hypertension. His hypertension is modestly high today. He is taking Coreg  and valsartan .   5. Osteoporosis s/p left hip fracture. Alendronate was stopped on 05/17/2024 due to advanced CKD. A DEXA scan here on 10/31/2023 showed a 14% decline in BMD at the hip and 4% increase at the lumbar spine, report below. He has persistent osteoporosis.    6. Vit D insufficiency. His vit D has normalized. He takes OTC vit D 1000 IU daily.   Past Medical History:  Diagnosis Date  . Allergy   . Anxiety   . Aortic atherosclerosis    on CT scan 01/2019  . Arthritis   . CAD (coronary artery disease)   . CHF (congestive heart failure) (CMS/HHS-HCC)   . Chronic kidney disease, stage 3 (CMS-HCC)   . Depression   . GERD (gastroesophageal reflux disease)   . Hyperlipidemia   . Hypertension    . Mesenteric ischemia (HHS-HCC) 2020  . Migraines   . Neuropathy    diabetic peripheral neuropathy  . Osteoporosis   . Sleep apnea   . Transfusion history 09/2019   3 U PRBC  . Type II diabetes mellitus (CMS/HHS-HCC)    A. Diagnosis, on insulin  since summer 2010. B. Diabetic peripheral neuropathy. C. Hospitalization with hypoglycemia 11/2012.    Outpatient Medications Marked as Taking for the 06/27/24 encounter (Office Visit) with Solum, Anna Melissa, MD  Medication Sig Dispense Refill        . amLODIPine  (NORVASC ) 10 MG tablet TAKE 1 TABLET EVERY DAY 90 tablet 3  . ascorbic acid  (VITAMIN C  ORAL) Take 1 tablet by mouth once daily    . aspirin  81 MG EC tablet Take 81 mg by mouth once daily    . atorvastatin  (LIPITOR ) 80 MG tablet TAKE 1 TABLET EVERY DAY 90 tablet 3  . cholecalciferol  (VITAMIN D3) 2,000 unit tablet TAKE 1 TABLET (2,000 UNITS TOTAL) BY MOUTH ONCE DAILY 90 tablet 3  . clopidogreL  (PLAVIX ) 75 mg tablet Take 75 mg by mouth once daily    . cyanocobalamin  (VITAMIN B12) 1000 MCG tablet Take 1,000 mcg by mouth once daily    . dapagliflozin propanediol (FARXIGA) 10 mg tablet Take 10 mg by mouth once daily    . donepeziL  (ARICEPT ) 10 MG tablet Take 10 mg by mouth at bedtime    .  escitalopram  oxalate (LEXAPRO ) 10 MG tablet Take 10 mg by mouth once daily    . gabapentin  (NEURONTIN ) 400 MG capsule TAKE 1 CAPSULE TWICE DAILY 180 capsule 3  . insulin  GLARGINE (LANTUS ) injection (concentration 100 units/mL) Inject 11 Units subcutaneously at bedtime (Patient taking differently: Inject 11-14 Units subcutaneously at bedtime) 45 mL 3  . metFORMIN  (GLUCOPHAGE -XR) 500 MG XR tablet TAKE 2 TABLETS TWICE DAILY 360 tablet 3  . pantoprazole  (PROTONIX ) 40 MG DR tablet TAKE 1 TABLET EVERY DAY 90 tablet 3  . QUEtiapine  (SEROQUEL ) 100 MG tablet Take 100 mg by mouth at bedtime In addition to 25 MG every day    . QUEtiapine  (SEROQUEL ) 25 MG tablet Take 25 mg by mouth once daily    . ranolazine   (RANEXA ) 1,000 mg ER tablet Take 1 tablet by mouth 2 (two) times daily    . semaglutide (OZEMPIC) 2 mg/dose (8 mg/3 mL) pen injector Inject 0.75 mLs (2 mg total) subcutaneously once a week 9 mL 3    Physical Exam BP (!) 160/70   Pulse 70   Ht 160 cm (5' 3)   Wt 77.1 kg (170 lb)   SpO2 97%   BMI 30.11 kg/m   GEN: well developed male in NAD.   PSYC: alert and oriented, fair insight.  Labs No visits with results within 2 Week(s) from this visit.  Latest known visit with results is:  Appointment on 04/23/2024  Component Date Value Ref Range Status  . Glucose 04/23/2024 124 (H)  70 - 110 mg/dL Final  . Sodium 91/87/7974 141  136 - 145 mmol/L Final  . Potassium 04/23/2024 5.1  3.6 - 5.1 mmol/L Final  . Chloride 04/23/2024 107  97 - 109 mmol/L Final  . Carbon Dioxide (CO2) 04/23/2024 26.3  22.0 - 32.0 mmol/L Final  . Urea  Nitrogen (BUN) 04/23/2024 33 (H)  7 - 25 mg/dL Final  . Creatinine 91/87/7974 2.1 (H)  0.7 - 1.3 mg/dL Final  . Glomerular Filtration Rate (eGFR) 04/23/2024 32 (L)  >60 mL/min/1.73sq m Final   CKD-EPI (2021) does not include patient's race in the calculation of eGFR.  Monitoring changes of plasma creatinine and eGFR over time is useful for monitoring kidney function.   Interpretive Ranges for eGFR (CKD-EPI 2021):  eGFR:       >60 mL/min/1.73 sq. m - Normal eGFR:       30-59 mL/min/1.73 sq. m - Moderately Decreased eGFR:       15-29 mL/min/1.73 sq. m  - Severely Decreased eGFR:       < 15 mL/min/1.73 sq. m  - Kidney Failure    Note: These eGFR calculations do not apply in acute situations when eGFR is changing rapidly or patients on dialysis.  . Calcium  04/23/2024 9.2  8.7 - 10.3 mg/dL Final  . AST  91/87/7974 17  8 - 39 U/L Final  . ALT  04/23/2024 12  6 - 57 U/L Final  . Alk Phos (alkaline Phosphatase) 04/23/2024 94  34 - 104 U/L Final  . Albumin  04/23/2024 4.4  3.5 - 4.8 g/dL Final  . Bilirubin, Total 04/23/2024 0.5  0.3 - 1.2 mg/dL Final  . Protein, Total  04/23/2024 6.8  6.1 - 7.9 g/dL Final  . A/G Ratio 91/87/7974 1.8  1.0 - 5.0 gm/dL Final  . Hemoglobin J8R 04/23/2024 7.7 (H)  4.2 - 5.6 % Final  . Average Blood Glucose (Calc) 04/23/2024 174  mg/dL Final  . Cholesterol, Total 04/23/2024 98 (L)  100 -  200 mg/dL Final  . Triglyceride 91/87/7974 129  35 - 199 mg/dL Final  . HDL (High Density Lipoprotein) Cho* 04/23/2024 48.7  29.0 - 71.0 mg/dL Final  . LDL Calculated 04/23/2024 24  0 - 130 mg/dL Final  . VLDL Cholesterol 04/23/2024 26  mg/dL Final  . Cholesterol/HDL Ratio 04/23/2024 2.0   Final  . Creatinine, Random Urine 04/23/2024 202.2  40.0 - 300.0 mg/dL Final  . Urine Albumin , Random 04/23/2024 160    mg/L Final  . Urine Albumin /Creatinine Ratio 04/23/2024 79.1 (H)  <30.0 ug/mg Final   Urine:         Spot collection              (g/mg creatinine)     Normal               < 30   Moderately          30-299         increased   Clinical             >=300 albuminuria    Assessment 1. DM type 2 with diabetic peripheral neuropathy (CMS/HHS-HCC)   2. Type 2 diabetes mellitus with vascular disease (CMS/HHS-HCC)   3. 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 Diabetes mellitus with stage 3 chronic kidney disease (CMS/HHS-HCC)   5. Falls frequently   6. Essential hypertension   7. Age related osteoporosis, unspecified pathological fracture presence    Plan * Recurrent falls may be due to medications. I agree with pt and psychiatrist about goal in reducing some of these mediations that could contribute to sedating effects and recurrent falls. We discussed diabetic neuropathy. His pain is well controlled on gabapentin , however he is wiling to try a lower dose of gabapentin . Will reduce dose to 300 mg BID. I counseled him on option to further reduce dose to be taken at bedtime only, if he can tolerate it.  *  Continue medications for diabetes as prescribed.  * Continue monitoring eGFR. Will hold metformin  once his eGFR declines to <30.  * Check blood glucose three times daily. Bring glucometer or CGM to each follow up appt.   * Continue statin for hyperlipidemia. * Monitoring hypertension. Continue ARB. * Continue regular follow up with Ophthalmology, as planned.  * Continue regular follow up with Nephrology, as planned.  * Continue Coreg  and valsartan  for hypertension. Will not increases meds for HTN at this time given possible underlying orthostatic hypotension contrbuting to syncopal episodes.  * Continue OTC vit D to 2000 IU daily. Take with food.  * Will consider repeat DEXA scan 2 yrs from last scan (after 11/01/2025). * Needs to continue use of ambulatory devices like walker / cane to reduce risk of falls.  * Return for follow up in 2 months as scheduled.

## 2024-06-28 ENCOUNTER — Ambulatory Visit: Admitting: Cardiovascular Disease

## 2024-06-28 ENCOUNTER — Encounter: Payer: Self-pay | Admitting: Cardiovascular Disease

## 2024-06-28 VITALS — BP 134/62 | HR 68 | Ht 64.0 in | Wt 169.0 lb

## 2024-06-28 DIAGNOSIS — I350 Nonrheumatic aortic (valve) stenosis: Secondary | ICD-10-CM

## 2024-06-28 DIAGNOSIS — R55 Syncope and collapse: Secondary | ICD-10-CM

## 2024-06-28 DIAGNOSIS — I10 Essential (primary) hypertension: Secondary | ICD-10-CM

## 2024-06-28 DIAGNOSIS — I2583 Coronary atherosclerosis due to lipid rich plaque: Secondary | ICD-10-CM

## 2024-06-28 DIAGNOSIS — I2510 Atherosclerotic heart disease of native coronary artery without angina pectoris: Secondary | ICD-10-CM

## 2024-06-28 DIAGNOSIS — N1832 Chronic kidney disease, stage 3b: Secondary | ICD-10-CM

## 2024-06-28 NOTE — Progress Notes (Signed)
 Cardiology Office Note   Date:  06/28/2024   ID:  SHAROD PETSCH, DOB 1946/03/21, MRN [id number]  PCP:  Lenon Layman ORN, MD  Cardiologist:  Denyse Bathe, MD      History of Present Illness: Patrick Turner is a 78 y.o. male who presents for  Chief Complaint  Patient presents with   Follow-up    81mo echo results    Had syncope last Friday a week ago. He went to ER, and W/U negative.  Loss of Consciousness This is a new problem. The current episode started in the past 7 days. The problem occurs intermittently. The problem has been rapidly worsening. He lost consciousness for a period of greater than 5 minutes. Associated symptoms include dizziness, light-headedness, slurred speech, a visual change and weakness. Pertinent negatives include no focal weakness, palpitations or vomiting. His past medical history is significant for TIA.      Past Medical History:  Diagnosis Date   Acute on chronic renal failure 03/23/2019   Acute on chronic renal insufficiency 05/30/2016   Altered mental status    Anemia    Anxiety    Aortic atherosclerosis    Arthritis    Coronary artery disease    CRD (chronic renal disease)    Depression    Diabetes mellitus without complication (HCC)    Encephalopathy acute    GERD (gastroesophageal reflux disease)    Headache    Hypercholesteremia    Hypertension    Neuropathy    Severe obesity (HCC)    Sleep apnea    Sleep terror    per patient this year per patient      Past Surgical History:  Procedure Laterality Date   APPENDECTOMY     CIRCUMCISION     COLONOSCOPY     COLONOSCOPY WITH PROPOFOL  N/A 07/10/2018   Procedure: COLONOSCOPY WITH PROPOFOL ;  Surgeon: [name], MD;  Location: [facility] ENDOSCOPY;  Service: Endoscopy;  Laterality: N/A;   COLONOSCOPY WITH PROPOFOL  N/A 04/23/2019   Procedure: COLONOSCOPY WITH PROPOFOL ;  Surgeon: [name], MD;  Location: [facility] ENDOSCOPY;  Service: Endoscopy;  Laterality: N/A;    CORONARY STENT INTERVENTION N/A 10/03/2019   Procedure: CORONARY STENT INTERVENTION;  Surgeon: [name], MD;  Location: [facility] INVASIVE CV LAB;  Service: Cardiovascular;  Laterality: N/A;   ESOPHAGOGASTRODUODENOSCOPY     ESOPHAGOGASTRODUODENOSCOPY (EGD) WITH PROPOFOL  N/A 04/23/2019   Procedure: ESOPHAGOGASTRODUODENOSCOPY (EGD) WITH PROPOFOL ;  Surgeon: [name], MD;  Location: [facility] ENDOSCOPY;  Service: Endoscopy;  Laterality: N/A;   INTRAMEDULLARY (IM) NAIL INTERTROCHANTERIC Left 09/17/2021   Procedure: INTRAMEDULLARY (IM) NAIL INTERTROCHANTRIC;  Surgeon: [name], MD;  Location: [facility] OR;  Service: Orthopedics;  Laterality: Left;   JOINT REPLACEMENT     KNEE ARTHROPLASTY Right 06/27/2016   Procedure: COMPUTER ASSISTED TOTAL KNEE ARTHROPLASTY;  Surgeon: [name], MD;  Location: [facility] ORS;  Service: Orthopedics;  Laterality: Right;   KNEE ARTHROSCOPY Right    LEFT HEART CATH AND CORONARY ANGIOGRAPHY N/A 10/03/2019   Procedure: Left Heart Cath and possible Coronary intervention;  Surgeon: [name] LABOR, MD;  Location: [facility] INVASIVE CV LAB;  Service: Cardiovascular;  Laterality: N/A;   TONSILLECTOMY     VISCERAL ANGIOGRAPHY N/A 05/23/2019   Procedure: VISCERAL ANGIOGRAPHY;  Surgeon: [name], MD;  Location: [facility] INVASIVE CV LAB;  Service: Cardiovascular;  Laterality: N/A;   VISCERAL ANGIOGRAPHY N/A 11/04/2019   Procedure: VISCERAL ANGIOGRAPHY;  Surgeon: [name], MD;  Location: [facility]  INVASIVE CV LAB;  Service: Cardiovascular;  Laterality: N/A;   VISCERAL ANGIOGRAPHY N/A 12/14/2021   Procedure: VISCERAL ANGIOGRAPHY;  Surgeon: [name], MD;  Location: [facility] INVASIVE CV LAB;  Service: Cardiovascular;  Laterality: N/A;     Current Outpatient Medications  Medication Sig Dispense Refill   amLODipine  (NORVASC ) 10 MG tablet Take 10 mg by mouth daily.     ascorbic acid  (VITAMIN C ) 500 MG tablet Take 500 mg by mouth daily.     aspirin  81 MG tablet Take 1 tablet (81 mg  total) by mouth daily.     atorvastatin  (LIPITOR ) 80 MG tablet Take 80 mg by mouth daily.     carvedilol  (COREG ) 3.125 MG tablet Take 3.125 mg by mouth 2 (two) times daily.     clopidogrel  (PLAVIX ) 75 MG tablet TAKE ONE TABLET BY MOUTH EVERY DAY 90 tablet 1   dapagliflozin propanediol (FARXIGA) 10 MG TABS tablet Take 10 mg by mouth daily.     donepezil  (ARICEPT ) 5 MG tablet Take 1 tablet (5 mg total) by mouth at bedtime. 30 tablet 0   escitalopram  (LEXAPRO ) 10 MG tablet Take 10 mg by mouth daily.     gabapentin  (NEURONTIN ) 400 MG capsule Take 400 mg by mouth 2 (two) times daily.     insulin  glargine (LANTUS ) 100 UNIT/ML injection Inject 11 Units into the skin daily.     metFORMIN  (GLUCOPHAGE -XR) 500 MG 24 hr tablet SMARTSIG:2.0 Tablet(s) By Mouth Twice Daily     Omega-3 Fatty Acids (FISH OIL) 1000 MG CAPS Take 1,000 mg by mouth daily.     pantoprazole  (PROTONIX ) 40 MG tablet Take 40 mg by mouth daily.     QUEtiapine  (SEROQUEL ) 100 MG tablet Take 100 mg by mouth at bedtime.     QUEtiapine  (SEROQUEL ) 25 MG tablet Take 1 tablet (25 mg total) by mouth in the morning. 30 tablet 2   ranolazine  (RANEXA ) 1000 MG SR tablet TAKE 1 TABLET TWICE DAILY 180 tablet 3   vitamin B-12 (CYANOCOBALAMIN ) 1000 MCG tablet Take 1,000 mcg by mouth daily.     No current facility-administered medications for this visit.    Allergies:   Penicillins and Tavist [clemastine]    Social History:   reports that he has never smoked. He has never used smokeless tobacco. He reports current alcohol use. He reports that he does not use drugs.   Family History:  family history includes COPD in his father; Cancer in his father; Diabetes in his mother; Heart disease in his mother.    ROS:     Review of Systems  Constitutional: Negative.   HENT: Negative.    Eyes: Negative.   Respiratory: Negative.    Cardiovascular:  Positive for syncope. Negative for palpitations.  Gastrointestinal: Negative.  Negative for vomiting.   Genitourinary: Negative.   Musculoskeletal: Negative.   Skin: Negative.   Neurological:  Positive for dizziness, weakness and light-headedness. Negative for focal weakness.  Endo/Heme/Allergies: Negative.   Psychiatric/Behavioral: Negative.    All other systems reviewed and are negative.     All other systems are reviewed and negative.    PHYSICAL EXAM: VS:  BP 134/62   Pulse 68   Ht 5' 4 (1.626 m)   Wt 169 lb (76.7 kg)   SpO2 98%   BMI 29.01 kg/m  , BMI Body mass index is 29.01 kg/m. Last weight:  Wt Readings from Last 3 Encounters:  06/28/24 169 lb (76.7 kg)  06/21/24 171 lb (77.6 kg)  06/04/24 171  lb 9.6 oz (77.8 kg)     Physical Exam Vitals reviewed.  Constitutional:      Appearance: Normal appearance. He is normal weight.  HENT:     Head: Normocephalic.     Nose: Nose normal.     Mouth/Throat:     Mouth: Mucous membranes are moist.  Eyes:     Pupils: Pupils are equal, round, and reactive to light.  Cardiovascular:     Rate and Rhythm: Normal rate and regular rhythm.     Pulses: Normal pulses.     Heart sounds: Normal heart sounds.  Pulmonary:     Effort: Pulmonary effort is normal.  Abdominal:     General: Abdomen is flat. Bowel sounds are normal.  Musculoskeletal:        General: Normal range of motion.     Cervical back: Normal range of motion.  Skin:    General: Skin is warm.  Neurological:     General: No focal deficit present.     Mental Status: He is alert.  Psychiatric:        Mood and Affect: Mood normal.       EKG:   Recent Labs: 05/04/2024: TSH 2.867 06/21/2024: ALT 12; BUN 40; Creatinine, Ser 1.66; Hemoglobin 11.6; Magnesium  1.8; Platelets 239; Potassium 4.3; Sodium 135    Lipid Panel    Component Value Date/Time   CHOL 88 02/08/2023 0430   TRIG 99 02/08/2023 0430   HDL 40 (L) 02/08/2023 0430   CHOLHDL 2.2 02/08/2023 0430   VLDL 20 02/08/2023 0430   LDLCALC 28 02/08/2023 0430      Other studies Reviewed: Additional  studies/ records that were reviewed today include:  Review of the above records demonstrates:       No data to display            ASSESSMENT AND PLAN:    ICD-10-CM   1. Coronary artery disease due to lipid rich plaque  I25.10 US  Carotid Bilateral   I25.83     2. HTN (hypertension), benign  I10 US  Carotid Bilateral    3. Nonrheumatic aortic valve stenosis  I35.0 US  Carotid Bilateral   ECHO, had trace AR normal EF.    4. Chronic kidney disease (CKD) stage G3b/A2, moderately decreased glomerular filtration rate (GFR) between 30-44 mL/min/1.73 square meter and albuminuria creatinine ratio between 30-299 mg/g (HCC)  N18.32 US  Carotid Bilateral   creat went up 1.93, stop lasix    5. Syncope, unspecified syncope type  R55 US  Carotid Bilateral   Hold lasix, as creat is 1.66 from 1.93. Neuro w/u was fine, but order caroted  doppler.       Problem List Items Addressed This Visit       Cardiovascular and Mediastinum   HTN (hypertension), benign   Relevant Orders   US  Carotid Bilateral   CAD (coronary artery disease) - Primary   Relevant Orders   US  Carotid Bilateral   Nonrheumatic aortic valve stenosis   Relevant Orders   US  Carotid Bilateral     Genitourinary   Chronic kidney disease (CKD) stage G3b/A2, moderately decreased glomerular filtration rate (GFR) between 30-44 mL/min/1.73 square meter and albuminuria creatinine ratio between 30-299 mg/g (HCC)   Relevant Orders   US  Carotid Bilateral   Other Visit Diagnoses       Syncope, unspecified syncope type       Hold lasix, as creat is 1.66 from 1.93. Neuro w/u was fine, but order caroted  doppler.  Relevant Orders   US  Carotid Bilateral          Disposition:   Return in about 5 weeks (around 08/02/2024) for caroted doppler and f/u.    Total time spent: 35 minutes  Signed,  Denyse Bathe, MD  06/28/2024 10:34 AM    [facility]

## 2024-07-01 ENCOUNTER — Ambulatory Visit: Admitting: Cardiovascular Disease

## 2024-07-01 DIAGNOSIS — I2510 Atherosclerotic heart disease of native coronary artery without angina pectoris: Secondary | ICD-10-CM | POA: Diagnosis not present

## 2024-07-01 DIAGNOSIS — N183 Chronic kidney disease, stage 3 unspecified: Secondary | ICD-10-CM | POA: Diagnosis not present

## 2024-07-01 DIAGNOSIS — F03A3 Unspecified dementia, mild, with mood disturbance: Secondary | ICD-10-CM | POA: Diagnosis not present

## 2024-07-01 DIAGNOSIS — K559 Vascular disorder of intestine, unspecified: Secondary | ICD-10-CM | POA: Diagnosis not present

## 2024-07-01 DIAGNOSIS — E785 Hyperlipidemia, unspecified: Secondary | ICD-10-CM | POA: Diagnosis not present

## 2024-07-01 DIAGNOSIS — F325 Major depressive disorder, single episode, in full remission: Secondary | ICD-10-CM | POA: Diagnosis not present

## 2024-07-01 DIAGNOSIS — E1122 Type 2 diabetes mellitus with diabetic chronic kidney disease: Secondary | ICD-10-CM | POA: Diagnosis not present

## 2024-07-01 DIAGNOSIS — E1169 Type 2 diabetes mellitus with other specified complication: Secondary | ICD-10-CM | POA: Diagnosis not present

## 2024-07-01 DIAGNOSIS — Z23 Encounter for immunization: Secondary | ICD-10-CM | POA: Diagnosis not present

## 2024-07-02 DIAGNOSIS — Z96651 Presence of right artificial knee joint: Secondary | ICD-10-CM | POA: Diagnosis not present

## 2024-07-03 ENCOUNTER — Ambulatory Visit (INDEPENDENT_AMBULATORY_CARE_PROVIDER_SITE_OTHER)

## 2024-07-03 DIAGNOSIS — I6523 Occlusion and stenosis of bilateral carotid arteries: Secondary | ICD-10-CM

## 2024-07-03 DIAGNOSIS — I10 Essential (primary) hypertension: Secondary | ICD-10-CM

## 2024-07-03 DIAGNOSIS — I2510 Atherosclerotic heart disease of native coronary artery without angina pectoris: Secondary | ICD-10-CM

## 2024-07-03 DIAGNOSIS — R55 Syncope and collapse: Secondary | ICD-10-CM | POA: Diagnosis not present

## 2024-07-03 DIAGNOSIS — I2583 Coronary atherosclerosis due to lipid rich plaque: Secondary | ICD-10-CM | POA: Diagnosis not present

## 2024-07-03 DIAGNOSIS — N1832 Chronic kidney disease, stage 3b: Secondary | ICD-10-CM

## 2024-07-03 DIAGNOSIS — R001 Bradycardia, unspecified: Secondary | ICD-10-CM

## 2024-07-03 DIAGNOSIS — I350 Nonrheumatic aortic (valve) stenosis: Secondary | ICD-10-CM | POA: Diagnosis not present

## 2024-07-05 ENCOUNTER — Encounter: Payer: Self-pay | Admitting: Cardiovascular Disease

## 2024-07-05 ENCOUNTER — Ambulatory Visit (INDEPENDENT_AMBULATORY_CARE_PROVIDER_SITE_OTHER): Admitting: Cardiovascular Disease

## 2024-07-05 VITALS — BP 140/66 | HR 69 | Ht 64.0 in | Wt 174.0 lb

## 2024-07-05 DIAGNOSIS — E782 Mixed hyperlipidemia: Secondary | ICD-10-CM

## 2024-07-05 DIAGNOSIS — R001 Bradycardia, unspecified: Secondary | ICD-10-CM | POA: Diagnosis not present

## 2024-07-05 DIAGNOSIS — I6523 Occlusion and stenosis of bilateral carotid arteries: Secondary | ICD-10-CM

## 2024-07-05 DIAGNOSIS — I350 Nonrheumatic aortic (valve) stenosis: Secondary | ICD-10-CM | POA: Diagnosis not present

## 2024-07-05 DIAGNOSIS — I10 Essential (primary) hypertension: Secondary | ICD-10-CM

## 2024-07-05 DIAGNOSIS — N1832 Chronic kidney disease, stage 3b: Secondary | ICD-10-CM | POA: Diagnosis not present

## 2024-07-05 DIAGNOSIS — N2889 Other specified disorders of kidney and ureter: Secondary | ICD-10-CM

## 2024-07-05 NOTE — Progress Notes (Signed)
 Cardiology Office Note   Date:  07/05/2024   ID:  [name], DOB [birth year], MRN [id number]  PCP:  Patrick Turner ORN, MD  Cardiologist:  Patrick Bathe, MD      History of Present Illness: Patrick Turner is a 78 y.o. male who presents for  Chief Complaint  Patient presents with   Follow-up    Carotid US  follow up    Has pain in left hip, but fell few months ago.      Past Medical History:  Diagnosis Date   Acute on chronic renal failure 03/23/2019   Acute on chronic renal insufficiency 05/30/2016   Altered mental status    Anemia    Anxiety    Aortic atherosclerosis    Arthritis    Coronary artery disease    CRD (chronic renal disease)    Depression    Diabetes mellitus without complication (HCC)    Encephalopathy acute    GERD (gastroesophageal reflux disease)    Headache    Hypercholesteremia    Hypertension    Neuropathy    Severe obesity (HCC)    Sleep apnea    Sleep terror    per patient this year per patient      Past Surgical History:  Procedure Laterality Date   APPENDECTOMY     CIRCUMCISION     COLONOSCOPY     COLONOSCOPY WITH PROPOFOL  N/A 07/10/2018   Procedure: COLONOSCOPY WITH PROPOFOL ;  Surgeon: Patrick Gladis PENNER, MD;  Location: [facility] ENDOSCOPY;  Service: Endoscopy;  Laterality: N/A;   COLONOSCOPY WITH PROPOFOL  N/A 04/23/2019   Procedure: COLONOSCOPY WITH PROPOFOL ;  Surgeon: Patrick Gladis PENNER, MD;  Location: [facility] ENDOSCOPY;  Service: Endoscopy;  Laterality: N/A;   CORONARY STENT INTERVENTION N/A 10/03/2019   Procedure: CORONARY STENT INTERVENTION;  Surgeon: [name], MD;  Location: [facility] INVASIVE CV LAB;  Service: Cardiovascular;  Laterality: N/A;   ESOPHAGOGASTRODUODENOSCOPY     ESOPHAGOGASTRODUODENOSCOPY (EGD) WITH PROPOFOL  N/A 04/23/2019   Procedure: ESOPHAGOGASTRODUODENOSCOPY (EGD) WITH PROPOFOL ;  Surgeon: Patrick Gladis PENNER, MD;  Location: [facility] ENDOSCOPY;  Service: Endoscopy;  Laterality: N/A;   INTRAMEDULLARY  (IM) NAIL INTERTROCHANTERIC Left 09/17/2021   Procedure: INTRAMEDULLARY (IM) NAIL INTERTROCHANTRIC;  Surgeon: Patrick Toribio LABOR, MD;  Location: [facility] OR;  Service: Orthopedics;  Laterality: Left;   JOINT REPLACEMENT     KNEE ARTHROPLASTY Right 06/27/2016   Procedure: COMPUTER ASSISTED TOTAL KNEE ARTHROPLASTY;  Surgeon: Patrick SHAUNNA Hue, MD;  Location: [facility] ORS;  Service: Orthopedics;  Laterality: Right;   KNEE ARTHROSCOPY Right    LEFT HEART CATH AND CORONARY ANGIOGRAPHY N/A 10/03/2019   Procedure: Left Heart Cath and possible Coronary intervention;  Surgeon: Patrick Turner LABOR, MD;  Location: [facility] INVASIVE CV LAB;  Service: Cardiovascular;  Laterality: N/A;   TONSILLECTOMY     VISCERAL ANGIOGRAPHY N/A 05/23/2019   Procedure: VISCERAL ANGIOGRAPHY;  Surgeon: Patrick Selinda RAMAN, MD;  Location: [facility] INVASIVE CV LAB;  Service: Cardiovascular;  Laterality: N/A;   VISCERAL ANGIOGRAPHY N/A 11/04/2019   Procedure: VISCERAL ANGIOGRAPHY;  Surgeon: Patrick Selinda RAMAN, MD;  Location: [facility] INVASIVE CV LAB;  Service: Cardiovascular;  Laterality: N/A;   VISCERAL ANGIOGRAPHY N/A 12/14/2021   Procedure: VISCERAL ANGIOGRAPHY;  Surgeon: Patrick Selinda RAMAN, MD;  Location: [facility] INVASIVE CV LAB;  Service: Cardiovascular;  Laterality: N/A;     Current Outpatient Medications  Medication Sig Dispense Refill   amLODipine  (NORVASC ) 10 MG tablet Take 10 mg by mouth daily.     ascorbic acid  (VITAMIN C )  500 MG tablet Take 500 mg by mouth daily.     aspirin  81 MG tablet Take 1 tablet (81 mg total) by mouth daily.     atorvastatin  (LIPITOR ) 80 MG tablet Take 80 mg by mouth daily.     Cholecalciferol  (VITAMIN D3) 10 MCG (400 UNIT) tablet Take 400 Units by mouth daily.     clopidogrel  (PLAVIX ) 75 MG tablet TAKE ONE TABLET BY MOUTH EVERY DAY 90 tablet 1   dapagliflozin propanediol (FARXIGA) 10 MG TABS tablet Take 10 mg by mouth daily.     escitalopram  (LEXAPRO ) 10 MG tablet Take 10 mg by mouth daily.     gabapentin  (NEURONTIN ) 400 MG capsule Take 400  mg by mouth 2 (two) times daily.     insulin  glargine (LANTUS ) 100 UNIT/ML injection Inject 11 Units into the skin daily.     metFORMIN  (GLUCOPHAGE -XR) 500 MG 24 hr tablet SMARTSIG:2.0 Tablet(s) By Mouth Twice Daily     pantoprazole  (PROTONIX ) 40 MG tablet Take 40 mg by mouth daily.     QUEtiapine  (SEROQUEL ) 100 MG tablet Take 100 mg by mouth at bedtime.     QUEtiapine  (SEROQUEL ) 25 MG tablet Take 1 tablet (25 mg total) by mouth in the morning. 30 tablet 2   ranolazine  (RANEXA ) 1000 MG SR tablet TAKE 1 TABLET TWICE DAILY 180 tablet 3   vitamin B-12 (CYANOCOBALAMIN ) 1000 MCG tablet Take 1,000 mcg by mouth daily.     No current facility-administered medications for this visit.    Allergies:   Penicillins and Tavist [clemastine]    Social History:   reports that he has never smoked. He has never used smokeless tobacco. He reports current alcohol use. He reports that he does not use drugs.   Family History:  family history includes COPD in his father; Cancer in his father; Diabetes in his mother; Heart disease in his mother.    ROS:     Review of Systems  Constitutional: Negative.   HENT: Negative.    Eyes: Negative.   Respiratory: Negative.    Gastrointestinal: Negative.   Genitourinary: Negative.   Musculoskeletal: Negative.   Skin: Negative.   Neurological: Negative.   Endo/Heme/Allergies: Negative.   Psychiatric/Behavioral: Negative.    All other systems reviewed and are negative.     All other systems are reviewed and negative.    PHYSICAL EXAM: VS:  BP (!) 140/66   Pulse 69   Ht 5' 4 (1.626 m)   Wt 174 lb (78.9 kg)   SpO2 97%   BMI 29.87 kg/m  , BMI Body mass index is 29.87 kg/m. Last weight:  Wt Readings from Last 3 Encounters:  07/05/24 174 lb (78.9 kg)  06/28/24 169 lb (76.7 kg)  06/21/24 171 lb (77.6 kg)     Physical Exam Vitals reviewed.  Constitutional:      Appearance: Normal appearance. He is normal weight.  HENT:     Head: Normocephalic.      Nose: Nose normal.     Mouth/Throat:     Mouth: Mucous membranes are moist.  Eyes:     Pupils: Pupils are equal, round, and reactive to light.  Cardiovascular:     Rate and Rhythm: Normal rate and regular rhythm.     Pulses: Normal pulses.     Heart sounds: Normal heart sounds.  Pulmonary:     Effort: Pulmonary effort is normal.  Abdominal:     General: Abdomen is flat. Bowel sounds are normal.  Musculoskeletal:  General: Normal range of motion.     Cervical back: Normal range of motion.  Skin:    General: Skin is warm.  Neurological:     General: No focal deficit present.     Mental Status: He is alert.  Psychiatric:        Mood and Affect: Mood normal.       EKG:   Recent Labs: 05/04/2024: TSH 2.867 06/21/2024: ALT 12; BUN 40; Creatinine, Ser 1.66; Hemoglobin 11.6; Magnesium  1.8; Platelets 239; Potassium 4.3; Sodium 135    Lipid Panel    Component Value Date/Time   CHOL 88 02/08/2023 0430   TRIG 99 02/08/2023 0430   HDL 40 (L) 02/08/2023 0430   CHOLHDL 2.2 02/08/2023 0430   VLDL 20 02/08/2023 0430   LDLCALC 28 02/08/2023 0430      Other studies Reviewed: Additional studies/ records that were reviewed today include:  Review of the above records demonstrates:       No data to display            ASSESSMENT AND PLAN:    ICD-10-CM   1. HTN (hypertension), benign  I10    systolic high 140 but get orthostasis as diastolic 66, normal to low    2. Nonrheumatic aortic valve stenosis  I35.0     3. Chronic kidney disease (CKD) stage G3b/A2, moderately decreased glomerular filtration rate (GFR) between 30-44 mL/min/1.73 square meter and albuminuria creatinine ratio between 30-299 mg/g (HCC)  N18.32     4. Mixed hyperlipidemia  E78.2     5. Sinus bradycardia  R00.1    off coreg  and much better HR, 69.    6. Bilateral carotid artery stenosis  I65.23    mild caroted  disease less than 50% .    7. Chronic renal impairment, stage 3 (moderate),  unspecified whether stage 3a or 3b CKD  N28.89    creat 1.66       Problem List Items Addressed This Visit       Cardiovascular and Mediastinum   Bilateral carotid artery disease   HTN (hypertension), benign - Primary   Nonrheumatic aortic valve stenosis     Genitourinary   Chronic kidney disease (CKD) stage G3b/A2, moderately decreased glomerular filtration rate (GFR) between 30-44 mL/min/1.73 square meter and albuminuria creatinine ratio between 30-299 mg/g (HCC)     Other   HLD (hyperlipidemia)   Other Visit Diagnoses       Sinus bradycardia       off coreg  and much better HR, 69.     Chronic renal impairment, stage 3 (moderate), unspecified whether stage 3a or 3b CKD       creat 1.66          Disposition:   Return in about 2 months (around 09/04/2024).    Total time spent: 30 minutes  Signed,  Patrick Bathe, MD  07/05/2024 11:25 AM    [facility]

## 2024-07-11 ENCOUNTER — Telehealth (INDEPENDENT_AMBULATORY_CARE_PROVIDER_SITE_OTHER): Payer: Self-pay | Admitting: Vascular Surgery

## 2024-07-11 DIAGNOSIS — N1832 Chronic kidney disease, stage 3b: Secondary | ICD-10-CM | POA: Diagnosis not present

## 2024-07-11 DIAGNOSIS — R809 Proteinuria, unspecified: Secondary | ICD-10-CM | POA: Diagnosis not present

## 2024-07-11 DIAGNOSIS — D631 Anemia in chronic kidney disease: Secondary | ICD-10-CM | POA: Diagnosis not present

## 2024-07-11 DIAGNOSIS — E1122 Type 2 diabetes mellitus with diabetic chronic kidney disease: Secondary | ICD-10-CM | POA: Diagnosis not present

## 2024-07-11 DIAGNOSIS — E875 Hyperkalemia: Secondary | ICD-10-CM | POA: Diagnosis not present

## 2024-07-11 DIAGNOSIS — N2581 Secondary hyperparathyroidism of renal origin: Secondary | ICD-10-CM | POA: Diagnosis not present

## 2024-07-11 DIAGNOSIS — I129 Hypertensive chronic kidney disease with stage 1 through stage 4 chronic kidney disease, or unspecified chronic kidney disease: Secondary | ICD-10-CM | POA: Diagnosis not present

## 2024-07-11 NOTE — Telephone Encounter (Signed)
 Patient LVM stating JD told him to call if he still had pain in his left side. Patient states he fell in the tub awhile back and every time he eats a full meal he gets a side pain. If he has small meals he is fine. He was in the hospital 1-2 weeks ago due to his side hurting him. States his other doctors think it has something to do with blood flow. Please advise what should be scheduled for patient.

## 2024-07-12 ENCOUNTER — Other Ambulatory Visit: Payer: Self-pay | Admitting: Cardiovascular Disease

## 2024-07-12 NOTE — Telephone Encounter (Signed)
 He can come in with mesenteric and see jd

## 2024-07-16 DIAGNOSIS — F02A2 Dementia in other diseases classified elsewhere, mild, with psychotic disturbance: Secondary | ICD-10-CM | POA: Diagnosis not present

## 2024-07-16 DIAGNOSIS — E875 Hyperkalemia: Secondary | ICD-10-CM | POA: Diagnosis not present

## 2024-07-16 DIAGNOSIS — D631 Anemia in chronic kidney disease: Secondary | ICD-10-CM | POA: Diagnosis not present

## 2024-07-16 DIAGNOSIS — F418 Other specified anxiety disorders: Secondary | ICD-10-CM | POA: Diagnosis not present

## 2024-07-16 DIAGNOSIS — I129 Hypertensive chronic kidney disease with stage 1 through stage 4 chronic kidney disease, or unspecified chronic kidney disease: Secondary | ICD-10-CM | POA: Diagnosis not present

## 2024-07-16 DIAGNOSIS — N2581 Secondary hyperparathyroidism of renal origin: Secondary | ICD-10-CM | POA: Diagnosis not present

## 2024-07-16 DIAGNOSIS — R809 Proteinuria, unspecified: Secondary | ICD-10-CM | POA: Diagnosis not present

## 2024-07-16 DIAGNOSIS — F332 Major depressive disorder, recurrent severe without psychotic features: Secondary | ICD-10-CM | POA: Diagnosis not present

## 2024-07-16 DIAGNOSIS — N1832 Chronic kidney disease, stage 3b: Secondary | ICD-10-CM | POA: Diagnosis not present

## 2024-07-16 DIAGNOSIS — E1122 Type 2 diabetes mellitus with diabetic chronic kidney disease: Secondary | ICD-10-CM | POA: Diagnosis not present

## 2024-07-19 DIAGNOSIS — E113313 Type 2 diabetes mellitus with moderate nonproliferative diabetic retinopathy with macular edema, bilateral: Secondary | ICD-10-CM | POA: Diagnosis not present

## 2024-07-24 ENCOUNTER — Other Ambulatory Visit (INDEPENDENT_AMBULATORY_CARE_PROVIDER_SITE_OTHER)

## 2024-07-24 DIAGNOSIS — K551 Chronic vascular disorders of intestine: Secondary | ICD-10-CM

## 2024-07-26 ENCOUNTER — Emergency Department (HOSPITAL_COMMUNITY)

## 2024-07-26 ENCOUNTER — Other Ambulatory Visit: Payer: Self-pay

## 2024-07-26 ENCOUNTER — Ambulatory Visit (INDEPENDENT_AMBULATORY_CARE_PROVIDER_SITE_OTHER): Admitting: Vascular Surgery

## 2024-07-26 ENCOUNTER — Emergency Department (HOSPITAL_COMMUNITY)
Admission: EM | Admit: 2024-07-26 | Discharge: 2024-07-27 | Disposition: A | Discharge status: Home / Self Care | Attending: Emergency Medicine | Admitting: Emergency Medicine

## 2024-07-26 DIAGNOSIS — Z7902 Long term (current) use of antithrombotics/antiplatelets: Secondary | ICD-10-CM | POA: Diagnosis not present

## 2024-07-26 DIAGNOSIS — Z794 Long term (current) use of insulin: Secondary | ICD-10-CM | POA: Insufficient documentation

## 2024-07-26 DIAGNOSIS — R10819 Abdominal tenderness, unspecified site: Secondary | ICD-10-CM | POA: Diagnosis not present

## 2024-07-26 DIAGNOSIS — G2119 Other drug induced secondary parkinsonism: Secondary | ICD-10-CM | POA: Diagnosis not present

## 2024-07-26 DIAGNOSIS — Z7982 Long term (current) use of aspirin: Secondary | ICD-10-CM | POA: Diagnosis not present

## 2024-07-26 DIAGNOSIS — I700 Atherosclerosis of aorta: Secondary | ICD-10-CM | POA: Diagnosis not present

## 2024-07-26 DIAGNOSIS — F0390 Unspecified dementia without behavioral disturbance: Secondary | ICD-10-CM | POA: Diagnosis not present

## 2024-07-26 DIAGNOSIS — W19XXXA Unspecified fall, initial encounter: Secondary | ICD-10-CM | POA: Insufficient documentation

## 2024-07-26 DIAGNOSIS — M25559 Pain in unspecified hip: Secondary | ICD-10-CM | POA: Diagnosis not present

## 2024-07-26 DIAGNOSIS — K5730 Diverticulosis of large intestine without perforation or abscess without bleeding: Secondary | ICD-10-CM | POA: Diagnosis not present

## 2024-07-26 DIAGNOSIS — S0990XA Unspecified injury of head, initial encounter: Secondary | ICD-10-CM | POA: Diagnosis not present

## 2024-07-26 DIAGNOSIS — Z79899 Other long term (current) drug therapy: Secondary | ICD-10-CM | POA: Diagnosis not present

## 2024-07-26 DIAGNOSIS — R296 Repeated falls: Secondary | ICD-10-CM | POA: Diagnosis not present

## 2024-07-26 DIAGNOSIS — S299XXA Unspecified injury of thorax, initial encounter: Secondary | ICD-10-CM | POA: Insufficient documentation

## 2024-07-26 DIAGNOSIS — F69 Unspecified disorder of adult personality and behavior: Secondary | ICD-10-CM | POA: Diagnosis not present

## 2024-07-26 DIAGNOSIS — T50905S Adverse effect of unspecified drugs, medicaments and biological substances, sequela: Secondary | ICD-10-CM | POA: Diagnosis not present

## 2024-07-26 DIAGNOSIS — R251 Tremor, unspecified: Secondary | ICD-10-CM | POA: Diagnosis not present

## 2024-07-26 DIAGNOSIS — E119 Type 2 diabetes mellitus without complications: Secondary | ICD-10-CM | POA: Diagnosis not present

## 2024-07-26 DIAGNOSIS — Z7984 Long term (current) use of oral hypoglycemic drugs: Secondary | ICD-10-CM | POA: Insufficient documentation

## 2024-07-26 DIAGNOSIS — T84115S Breakdown (mechanical) of internal fixation device of left femur, sequela: Secondary | ICD-10-CM | POA: Diagnosis not present

## 2024-07-26 DIAGNOSIS — R791 Abnormal coagulation profile: Secondary | ICD-10-CM | POA: Diagnosis not present

## 2024-07-26 DIAGNOSIS — I10 Essential (primary) hypertension: Secondary | ICD-10-CM | POA: Diagnosis not present

## 2024-07-26 DIAGNOSIS — Z043 Encounter for examination and observation following other accident: Secondary | ICD-10-CM | POA: Diagnosis not present

## 2024-07-26 DIAGNOSIS — S199XXA Unspecified injury of neck, initial encounter: Secondary | ICD-10-CM | POA: Diagnosis not present

## 2024-07-26 DIAGNOSIS — M25552 Pain in left hip: Secondary | ICD-10-CM | POA: Insufficient documentation

## 2024-07-26 DIAGNOSIS — M47812 Spondylosis without myelopathy or radiculopathy, cervical region: Secondary | ICD-10-CM | POA: Diagnosis not present

## 2024-07-26 DIAGNOSIS — S3993XA Unspecified injury of pelvis, initial encounter: Secondary | ICD-10-CM | POA: Diagnosis not present

## 2024-07-26 DIAGNOSIS — G20A1 Parkinson's disease without dyskinesia, without mention of fluctuations: Secondary | ICD-10-CM | POA: Insufficient documentation

## 2024-07-26 DIAGNOSIS — Z8673 Personal history of transient ischemic attack (TIA), and cerebral infarction without residual deficits: Secondary | ICD-10-CM | POA: Diagnosis not present

## 2024-07-26 LAB — COMPREHENSIVE METABOLIC PANEL WITH GFR
ALT: 13 U/L (ref 0–44)
AST: 19 U/L (ref 15–41)
Albumin: 3.5 g/dL (ref 3.5–5.0)
Alkaline Phosphatase: 61 U/L (ref 38–126)
Anion gap: 16 — ABNORMAL HIGH (ref 5–15)
BUN: 31 mg/dL — ABNORMAL HIGH (ref 8–23)
CO2: 22 mmol/L (ref 22–32)
Calcium: 8.8 mg/dL — ABNORMAL LOW (ref 8.9–10.3)
Chloride: 101 mmol/L (ref 98–111)
Creatinine, Ser: 1.85 mg/dL — ABNORMAL HIGH (ref 0.61–1.24)
GFR, Estimated: 37 mL/min — ABNORMAL LOW (ref >60)
Glucose, Bld: 159 mg/dL — ABNORMAL HIGH (ref 70–99)
Potassium: 4.6 mmol/L (ref 3.5–5.1)
Sodium: 139 mmol/L (ref 135–145)
Total Bilirubin: 0.7 mg/dL (ref 0.0–1.2)
Total Protein: 6.1 g/dL — ABNORMAL LOW (ref 6.5–8.1)

## 2024-07-26 LAB — PROTIME-INR
INR: 1 (ref 0.8–1.2)
Prothrombin Time: 13.9 s (ref 11.4–15.2)

## 2024-07-26 LAB — CBC
HCT: 37 % — ABNORMAL LOW (ref 39.0–52.0)
Hemoglobin: 12.1 g/dL — ABNORMAL LOW (ref 13.0–17.0)
MCH: 31.8 pg (ref 26.0–34.0)
MCHC: 32.7 g/dL (ref 30.0–36.0)
MCV: 97.1 fL (ref 80.0–100.0)
Platelets: 237 K/uL (ref 150–400)
RBC: 3.81 MIL/uL — ABNORMAL LOW (ref 4.22–5.81)
RDW: 13.9 % (ref 11.5–15.5)
WBC: 10.1 K/uL (ref 4.0–10.5)
nRBC: 0 % (ref 0.0–0.2)

## 2024-07-26 LAB — SAMPLE TO BLOOD BANK

## 2024-07-26 LAB — CBG MONITORING, ED: Glucose-Capillary: 89 mg/dL (ref 70–99)

## 2024-07-26 LAB — ETHANOL: Alcohol, Ethyl (B): <15 mg/dL (ref <15)

## 2024-07-26 MED ORDER — INSULIN GLARGINE-YFGN 100 UNIT/ML ~~LOC~~ SOLN
11.0000 [IU] | Freq: Every day | SUBCUTANEOUS | Status: DC
  Administered 2024-07-26: 11 [IU] via SUBCUTANEOUS
  Filled 2024-07-26 (×2): qty 0.11

## 2024-07-26 MED ORDER — VITAMIN B-12 1000 MCG PO TABS
1000.0000 ug | ORAL_TABLET | Freq: Every day | ORAL | Status: DC
  Administered 2024-07-27: 1000 ug via ORAL
  Filled 2024-07-26 (×2): qty 1

## 2024-07-26 MED ORDER — DAPAGLIFLOZIN PROPANEDIOL 10 MG PO TABS
10.0000 mg | ORAL_TABLET | Freq: Every day | ORAL | Status: DC
  Administered 2024-07-27: 10 mg via ORAL
  Filled 2024-07-26 (×2): qty 1

## 2024-07-26 MED ORDER — GABAPENTIN 400 MG PO CAPS
400.0000 mg | ORAL_CAPSULE | Freq: Two times a day (BID) | ORAL | Status: DC
  Administered 2024-07-26 – 2024-07-27 (×2): 400 mg via ORAL
  Filled 2024-07-26: qty 1
  Filled 2024-07-26: qty 4

## 2024-07-26 MED ORDER — INSULIN ASPART 100 UNIT/ML IJ SOLN
4.0000 [IU] | Freq: Three times a day (TID) | INTRAMUSCULAR | Status: DC
  Administered 2024-07-27: 4 [IU] via SUBCUTANEOUS
  Filled 2024-07-26: qty 4

## 2024-07-26 MED ORDER — QUETIAPINE FUMARATE 100 MG PO TABS
100.0000 mg | ORAL_TABLET | Freq: Every day | ORAL | Status: DC
  Administered 2024-07-26: 100 mg via ORAL
  Filled 2024-07-26: qty 4

## 2024-07-26 MED ORDER — AMLODIPINE BESYLATE 5 MG PO TABS
10.0000 mg | ORAL_TABLET | Freq: Every day | ORAL | Status: DC
  Administered 2024-07-27: 10 mg via ORAL
  Filled 2024-07-26 (×2): qty 2

## 2024-07-26 MED ORDER — IOHEXOL 350 MG/ML SOLN
75.0000 mL | Freq: Once | INTRAVENOUS | Status: AC | PRN
  Administered 2024-07-26: 75 mL via INTRAVENOUS

## 2024-07-26 MED ORDER — ESCITALOPRAM OXALATE 10 MG PO TABS
10.0000 mg | ORAL_TABLET | Freq: Every day | ORAL | Status: DC
  Administered 2024-07-26 – 2024-07-27 (×2): 10 mg via ORAL
  Filled 2024-07-26 (×2): qty 1

## 2024-07-26 MED ORDER — QUETIAPINE FUMARATE 25 MG PO TABS
25.0000 mg | ORAL_TABLET | Freq: Every morning | ORAL | Status: DC
  Administered 2024-07-27: 25 mg via ORAL
  Filled 2024-07-26: qty 1

## 2024-07-26 MED ORDER — ATORVASTATIN CALCIUM 80 MG PO TABS
80.0000 mg | ORAL_TABLET | Freq: Every day | ORAL | Status: DC
  Administered 2024-07-26 – 2024-07-27 (×2): 80 mg via ORAL
  Filled 2024-07-26 (×2): qty 2

## 2024-07-26 MED ORDER — PANTOPRAZOLE SODIUM 40 MG PO TBEC
40.0000 mg | DELAYED_RELEASE_TABLET | Freq: Every day | ORAL | Status: DC
  Administered 2024-07-27: 40 mg via ORAL
  Filled 2024-07-26 (×2): qty 1

## 2024-07-26 MED ORDER — METFORMIN HCL ER 500 MG PO TB24: 500.0000 mg | ORAL_TABLET | Freq: Two times a day (BID) | ORAL | Status: DC

## 2024-07-26 MED ORDER — INSULIN ASPART 100 UNIT/ML IJ SOLN
0.0000 [IU] | Freq: Three times a day (TID) | INTRAMUSCULAR | Status: DC
  Administered 2024-07-27: 2 [IU] via SUBCUTANEOUS
  Filled 2024-07-26: qty 2

## 2024-07-26 MED ORDER — CLOPIDOGREL BISULFATE 75 MG PO TABS
75.0000 mg | ORAL_TABLET | Freq: Every day | ORAL | Status: DC
  Administered 2024-07-26 – 2024-07-27 (×2): 75 mg via ORAL
  Filled 2024-07-26 (×2): qty 1

## 2024-07-26 MED ORDER — VITAMIN C 500 MG PO TABS
500.0000 mg | ORAL_TABLET | Freq: Every day | ORAL | Status: DC
  Administered 2024-07-27: 500 mg via ORAL
  Filled 2024-07-26: qty 1

## 2024-07-26 MED ORDER — RANOLAZINE ER 500 MG PO TB12
1000.0000 mg | ORAL_TABLET | Freq: Two times a day (BID) | ORAL | Status: DC
  Administered 2024-07-26 – 2024-07-27 (×2): 1000 mg via ORAL
  Filled 2024-07-26 (×3): qty 2

## 2024-07-26 MED ORDER — ASPIRIN 81 MG PO CHEW
81.0000 mg | CHEWABLE_TABLET | Freq: Every day | ORAL | Status: DC
  Administered 2024-07-27: 81 mg via ORAL
  Filled 2024-07-26 (×2): qty 1

## 2024-07-26 NOTE — ED Notes (Signed)
 Pt arriving from home via [facility] with chief complaint of a fall on thinners. Pt has been having tremors since last night which is new for him - Legs gave out he fell about an hour ago from ground level while trying to get out of bed.  No bruising or deformities. On Plavix . No reported LOC. The pt states he did hit his head.   Left hip appears slightly shortened and rotated, but he has a hx of hip fracture    CBG 189 BP 150/70 HR 72 RR 18 O2 96 on RA   Manual 150/60 BP

## 2024-07-26 NOTE — TOC Initial Note (Signed)
 Transition of Care [facility]) - Initial/Assessment Note    Patient Details  Name: Patrick Turner MRN: [id number] Date of Birth: September 05, 1946  Transition of Care [facility]) CM/SW Contact:    Hartley KATHEE Robertson, LCSWA Phone Number: 07/26/2024, 7:10 PM  Clinical Narrative:                  CSW spoke with pt via phone, he is agreeable to SNF placement if PT recommends it. CSW explained Medicare.gov ratings list and insurance auth process. Pt prefers placement in the [place] area. TOC will await PT recs.         Patient Goals and CMS Choice            Expected Discharge Plan and Services                                              Prior Living Arrangements/Services                       Activities of Daily Living      Permission Sought/Granted                  Emotional Assessment              Admission diagnosis:  FOT Patient Active Problem List   Diagnosis Date Noted   Focal neurological deficit 06/21/2024   Acute delirium 10/03/2023   Dementia with behavioral disturbance (HCC) 10/03/2023   Anxiety 10/03/2023   Mild episode of recurrent major depressive disorder 10/03/2023   Nonrheumatic aortic valve stenosis 02/03/2023   Hypertensive urgency 11/04/2021   Slow rate of speech 11/04/2021   Type II diabetes mellitus with renal manifestations (HCC) 11/04/2021   Depression with anxiety 11/04/2021   CAD (coronary artery disease) 11/04/2021   SAH (subarachnoid hemorrhage) (HCC) 11/04/2021   Mild dementia (HCC)    Stage 3b chronic kidney disease (HCC)    Closed left hip fracture, initial encounter (HCC) 09/16/2021   Anemia in chronic kidney disease 05/28/2020   Benign hypertensive kidney disease with chronic kidney disease 05/28/2020   Proteinuria 05/28/2020   Falls frequently 04/27/2020   Use of cane as ambulatory aid 04/27/2020   Numbness and tingling of both legs 02/18/2020   Primary osteoarthritis of left knee 01/04/2020   Diabetic  retinopathy (HCC) 01/01/2020   ASCVD (arteriosclerotic cardiovascular disease) 10/16/2019   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 without macular edema, with long-term current use of insulin  (HCC) 10/16/2019   Hyperkalemia 10/14/2019   Pseudoaneurysm of right femoral artery 10/14/2019   Unstable angina (HCC) 10/02/2019   Chest pain 10/01/2019   Uncontrolled type 2 diabetes mellitus with hyperglycemia, with long-term current use of insulin  (HCC) 09/27/2019   Type 2 diabetes mellitus with vascular disease (HCC) 09/27/2019   Chronic mesenteric ischemia 05/06/2019   Abdominal pain 05/03/2019   AMS (altered mental status) 12/24/2018   Dizziness 06/30/2018   HLD (hyperlipidemia) 02/23/2018   Aortic atherosclerosis 11/02/2017   Healthcare maintenance 12/26/2016   DM type 2 with diabetic peripheral neuropathy (HCC) 11/09/2016   Presence of right artificial knee joint 08/14/2016   Bilateral carotid artery disease 07/12/2016   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   Encephalopathy acute 05/30/2016   Anemia 05/30/2016   Diarrhea 05/30/2016   Chronic kidney disease (CKD) stage G3b/A2, moderately decreased  glomerular filtration rate (GFR) between 30-44 mL/min/1.73 square meter and albuminuria creatinine ratio between 30-299 mg/g (HCC) 03/30/2015   Depression, major, in remission 08/02/2014   GERD (gastroesophageal reflux disease) 03/08/2014   Hyperlipidemia associated with type 2 diabetes mellitus (HCC) 03/08/2014   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 [place] [address] Phone: [phone] Fax: [phone]     Social Drivers of Health (SDOH) Social History: SDOH Screenings   Food Insecurity: No Food Insecurity (07/02/2024)   Received from [facility]  Housing: Low Risk  (07/02/2024)   Received from [facility]  Transportation Needs: No Transportation Needs (07/02/2024)   Received from [facility]  Utilities: Not At Risk (07/02/2024)   Received from [facility]  Depression [phone]): Low Risk  (01/13/2022)  Financial Resource Strain: Low Risk  (07/02/2024)   Received from [facility]  Physical Activity: Unknown (05/23/2019)  Social Connections: Unknown (05/23/2019)  Stress: No Stress Concern Present (05/23/2019)  Tobacco Use: Low Risk  (07/06/2024)   Received from [facility]   SDOH Interventions:     Readmission Risk Interventions    02/09/2023   10:01 AM  Readmission Risk Prevention Plan  Transportation Screening Complete  PCP or Specialist Appt within 3-5 Days Complete  HRI or Home Care Consult Complete  Social Work Consult for Recovery Care Planning/Counseling Complete  Palliative Care Screening Not Applicable  Medication Review Oceanographer) Not Complete

## 2024-07-26 NOTE — ED Notes (Signed)
 Pt had already taken several daily meds - Marked refused.

## 2024-07-26 NOTE — Discharge Instructions (Addendum)
 Please follow-up with a primary care provider. Your trauma evaluation was negative for acute injury. PT cleared you for outpatient PT and home health PT orders have been placed. An outpatient referral to neurology has been placed.

## 2024-07-26 NOTE — ED Notes (Signed)
 Meal tray not delivered yet, called kitchen, said it was delivered already. Another tray ordered for patient.

## 2024-07-26 NOTE — ED Provider Notes (Addendum)
 [facility]PART[facility] AT [facility] Provider Note   CSN: [id number] Arrival date & time: 07/26/24  1222     Patient presents with: Trauma   Patrick Turner is a 78 y.o. male.  With a history of dementia, TIA type 2 diabetes drug-induced parkinsonian is him and frequent falls status post ORIF left intertrochanteric fracture presents to the ED after fall at home.  Fall with positive head trauma.  No LOC.  Reporting some left hip pain at the site of previous intertrochanteric fracture status post ORIF.  Denies headaches neck pain back pain chest pain shortness of breath or pain in extremities.  Takes Plavix  daily.  No other anticoagulation   HPI     Prior to Admission medications   Medication Sig Start Date End Date Taking? Authorizing Provider  amLODipine  (NORVASC ) 10 MG tablet Take 10 mg by mouth daily.    [provider]  ascorbic acid  (VITAMIN C ) 500 MG tablet Take 500 mg by mouth daily.    [provider]  aspirin  81 MG tablet Take 1 tablet (81 mg total) by mouth daily. 02/23/18   Sainani, Vivek J, MD  atorvastatin  (LIPITOR ) 80 MG tablet Take 80 mg by mouth daily.    [provider]  Cholecalciferol  (VITAMIN D3) 10 MCG (400 UNIT) tablet Take 400 Units by mouth daily.    [provider]  clopidogrel  (PLAVIX ) 75 MG tablet TAKE ONE TABLET BY MOUTH ONCE DAILY 07/15/24   Fernand Alter A, MD  dapagliflozin propanediol (FARXIGA) 10 MG TABS tablet Take 10 mg by mouth daily. 04/25/24 04/25/25  [provider]  escitalopram  (LEXAPRO ) 10 MG tablet Take 10 mg by mouth daily.    [provider]  gabapentin  (NEURONTIN ) 400 MG capsule Take 400 mg by mouth 2 (two) times daily.    [provider]  insulin  glargine (LANTUS ) 100 UNIT/ML injection Inject 11 Units into the skin daily.    [provider]  metFORMIN  (GLUCOPHAGE -XR) 500 MG 24 hr tablet SMARTSIG:2.0 Tablet(s) By Mouth Twice Daily 07/12/23   [provider]  pantoprazole  (PROTONIX ) 40 MG tablet Take 40 mg by mouth daily.    [provider]  QUEtiapine  (SEROQUEL ) 100 MG tablet Take 100 mg by mouth at bedtime.    [provider]  QUEtiapine  (SEROQUEL ) 25 MG tablet Take 1 tablet (25 mg total) by mouth in the morning. 10/03/23   Floy Roberts, MD  ranolazine  (RANEXA ) 1000 MG SR tablet TAKE 1 TABLET TWICE DAILY 05/06/24   Fernand Alter LABOR, MD  vitamin B-12 (CYANOCOBALAMIN ) 1000 MCG tablet Take 1,000 mcg by mouth daily.    [provider]    Allergies: Penicillins and Tavist [clemastine]    Review of Systems  Updated Vital Signs BP (!) 144/66   Pulse 61   Temp 98.2 F (36.8 C) (Oral)   Resp 18   SpO2 100%   Physical Exam Vitals and nursing note reviewed.  HENT:     Head: Normocephalic and atraumatic.  Eyes:     Pupils: Pupils are equal, round, and reactive to light.  Cardiovascular:     Rate and Rhythm: Normal rate and regular rhythm.  Pulmonary:     Effort: Pulmonary effort is normal.     Breath sounds: Normal breath sounds.  Abdominal:     Palpations: Abdomen is soft.     Tenderness: There is abdominal tenderness.  Musculoskeletal:     Cervical back: Neck supple. No tenderness.  Comments: Shortening and internal rotation of left lower extremity 2+ DP pulse bilaterally 5 out of 5 motor strength bilateral upper extremities Limited flexion at the left hip No midline tender step-off deformity back Well-healed surgical scar over anterior right knee and left lateral hip  Skin:    General: Skin is warm and dry.  Neurological:     Mental Status: He is alert.  Psychiatric:        Mood and Affect: Mood normal.     (all labs ordered are listed, but only abnormal results are displayed) Labs Reviewed  COMPREHENSIVE METABOLIC PANEL WITH GFR - Abnormal; Notable for the following components:      Result Value   Glucose, Bld 159 (*)    BUN 31 (*)    Creatinine, Ser 1.85 (*)    Calcium  8.8  (*)    Total Protein 6.1 (*)    GFR, Estimated 37 (*)    Anion gap 16 (*)    All other components within normal limits  CBC - Abnormal; Notable for the following components:   RBC 3.81 (*)    Hemoglobin 12.1 (*)    HCT 37.0 (*)    All other components within normal limits  CBG MONITORING, ED - Abnormal; Notable for the following components:   Glucose-Capillary 137 (*)    All other components within normal limits  CBG MONITORING, ED - Abnormal; Notable for the following components:   Glucose-Capillary 69 (*)    All other components within normal limits  CBG MONITORING, ED - Abnormal; Notable for the following components:   Glucose-Capillary 66 (*)    All other components within normal limits  ETHANOL  PROTIME-INR  CDS SEROLOGY  HEMOGLOBIN A1C  CBG MONITORING, ED  CBG MONITORING, ED  SAMPLE TO BLOOD BANK    EKG: EKG Interpretation Date/Time:  Friday July 26 2024 13:28:00 EST Ventricular Rate:  61 PR Interval:  186 QRS Duration:  82 QT Interval:  428 QTC Calculation: 430 R Axis:   -19  Text Interpretation: Normal sinus rhythm Normal ECG No significant change since last tracing Confirmed by Jerrol Agent ([id number]) on 07/27/2024 11:55:53 AM  Radiology: CT CHEST ABDOMEN PELVIS W CONTRAST Result Date: 07/26/2024 CLINICAL DATA:  Fall.  Abdominal tenderness. EXAM: CT CHEST, ABDOMEN, AND PELVIS WITH CONTRAST TECHNIQUE: Multidetector CT imaging of the chest, abdomen and pelvis was performed following the standard protocol during bolus administration of intravenous contrast. RADIATION DOSE REDUCTION: This exam was performed according to the departmental dose-optimization program which includes automated exposure control, adjustment of the mA and/or kV according to patient size and/or use of iterative reconstruction technique. CONTRAST:  75mL OMNIPAQUE  IOHEXOL  350 MG/ML SOLN COMPARISON:  CT abdomen/pelvis dated 06/21/2024. CT chest dated 10/23/2020. FINDINGS: CT CHEST FINDINGS  Cardiovascular: Heart size is within normal limits. No pericardial effusion. Multivessel coronary artery calcifications. Nonaneurysmal thoracic aorta with atherosclerotic calcification. Mediastinum/Nodes: No enlarged mediastinal, hilar, or axillary lymph nodes. Thyroid  gland, trachea, and esophagus demonstrate no significant findings. Lungs/Pleura: Lungs are generally well aerated. Minimal linear scarring/atelectasis in the lingula and left lung base. No focal consolidation. No pleural effusion or pneumothorax. Musculoskeletal: Subacute versus chronic nondisplaced fracture of the right lateral tenth rib, near the costochondral junction, with mild marginal callus formation. No appreciable acute fracture. No acute chest wall abnormality. CT ABDOMEN PELVIS FINDINGS Hepatobiliary: No hepatic injury or perihepatic hematoma. Gallbladder is unremarkable. Pancreas: Unremarkable. No pancreatic ductal dilatation or surrounding inflammatory changes. Spleen: Normal in size without focal abnormality. Adrenals/Urinary Tract: No adrenal  hemorrhage or renal injury identified. No urolithiasis or hydronephrosis. Bladder is unremarkable. Stomach/Bowel: Stomach is within normal limits. Status post appendectomy. No obstruction or inflammatory changes. Sigmoid colonic diverticulosis without evidence of acute diverticulitis. Large stool burden throughout the transverse, descending, and rectosigmoid colon. Vascular/Lymphatic: Nonaneurysmal abdominal aorta with atherosclerotic calcification. Superior mesenteric artery stent. No enlarged abdominal or pelvic lymph nodes. Reproductive: Similar mild enlargement of the prostate gland with median lobe hypertrophy. Other: No abdominal wall hernia or abnormality. No abdominopelvic ascites. Musculoskeletal: No acute osseous abnormality. Postoperative changes related to ORIF of the left femur. Multilevel degenerative disc changes of the lumbar spine with similar grade 1 anterolisthesis of L4 on L5  and stepwise retrolisthesis of L1 on L2 and L2 on L3. IMPRESSION: 1. No acute traumatic findings in the chest, abdomen, or pelvis. 2. Subacute versus chronic nondisplaced fracture of the right lateral tenth rib, near the costochondral junction. 3. Large stool burden throughout the transverse, descending, and rectosigmoid colon. 4. Sigmoid colonic diverticulosis without evidence of acute diverticulitis. 5. Additional unchanged non-acute findings, as described above. 6.  Aortic Atherosclerosis (ICD10-I70.0). Electronically Signed   By: Harrietta Sherry M.D.   On: 07/26/2024 14:01   DG Hip Unilat W or Wo Pelvis 2-3 Views Left Result Date: 07/26/2024 EXAM: 2 or more VIEW(S) XRAY OF THE LEFT HIP 07/26/2024 12:58:00 PM COMPARISON: None available. CLINICAL HISTORY: fall FINDINGS: BONES AND JOINTS: Intramedullary nail fixation of the left femur is in place. There is a fracture of the screw that transfixes the intramedullary nail to the distal femur. The hip joint is maintained. No significant degenerative changes of the hip joint. SOFT TISSUES: Vascular calcifications. LUMBAR SPINE: Degenerative changes of visualized lumbar spine. LEFT KNEE: At least moderate degenerative changes of the left knee. IMPRESSION: 1. Fracture of the distal interlocking screw of the left femoral intramedullary nail. Electronically signed by: Ryan Chess MD 07/26/2024 01:31 PM EST RP Workstation: [id number]   DG Pelvis Portable Result Date: 07/26/2024 EXAM: 1 or 2 VIEW(S) XRAY OF THE PELVIS 07/26/2024 12:58:00 PM COMPARISON: 06/30/2018 CLINICAL HISTORY: trauma FINDINGS: BONES AND JOINTS: Old healed left intertrochanteric fracture transfixed with intramedullary rod and interlocking compression screw. No acute fracture. No joint dislocation. SOFT TISSUES: Vascular calcifications. IMPRESSION: 1. No acute pelvic fracture. 2. Old healed left intertrochanteric fracture transfixed with intramedullary rod and interlocking compression screw.  Electronically signed by: Ryan Chess MD 07/26/2024 01:29 PM EST RP Workstation: [id number]   DG Chest Portable 1 View Result Date: 07/26/2024 EXAM: 1 VIEW(S) XRAY OF THE CHEST 07/26/2024 12:58:00 PM COMPARISON: 05/04/2024 CLINICAL HISTORY: trauma FINDINGS: LUNGS AND PLEURA: Mildly decreased lung volumes. No focal pulmonary opacity. No pleural effusion. No pneumothorax. HEART AND MEDIASTINUM: Mild aortic arch calcification. Coronary artery stent noted. No acute abnormality of the cardiac and mediastinal silhouettes. BONES AND SOFT TISSUES: No acute osseous abnormality. IMPRESSION: 1. No acute cardiopulmonary process. 2. Aortic Atherosclerosis (ICD10-I70.0). Electronically signed by: Ryan Chess MD 07/26/2024 01:27 PM EST RP Workstation: [id number]   CT HEAD WO CONTRAST Result Date: 07/26/2024 EXAM: CT HEAD WITHOUT CONTRAST 07/26/2024 01:16:00 PM TECHNIQUE: CT of the head was performed without the administration of intravenous contrast. Automated exposure control, iterative reconstruction, and/or weight based adjustment of the mA/kV was utilized to reduce the radiation dose to as low as reasonably achievable. COMPARISON: 06/21/2024 CLINICAL HISTORY: Head trauma, moderate-severe. FINDINGS: BRAIN AND VENTRICLES: No acute hemorrhage. No evidence of acute infarct. No hydrocephalus. No extra-axial collection. No mass effect or midline shift. ORBITS: No acute abnormality. SINUSES: No acute  abnormality. SOFT TISSUES AND SKULL: No acute soft tissue abnormality. No skull fracture. IMPRESSION: 1. No acute intracranial abnormality. Electronically signed by: Ryan Chess MD 07/26/2024 01:22 PM EST RP Workstation: [id number]   CT CERVICAL SPINE WO CONTRAST Result Date: 07/26/2024 EXAM: CT CERVICAL SPINE WITHOUT CONTRAST 07/26/2024 01:16:00 PM TECHNIQUE: CT of the cervical spine was performed without the administration of intravenous contrast. Multiplanar reformatted images are provided for review. Automated  exposure control, iterative reconstruction, and/or weight based adjustment of the mA/kV was utilized to reduce the radiation dose to as low as reasonably achievable. COMPARISON: CT cervical spine 05/04/2024. CLINICAL HISTORY: Polytrauma, blunt. FINDINGS: CERVICAL SPINE: BONES AND ALIGNMENT: No acute fracture or traumatic malalignment. DEGENERATIVE CHANGES: Stable mild cervical spondylosis without high grade spinal canal stenosis. SOFT TISSUES: No prevertebral soft tissue swelling. IMPRESSION: 1. No acute abnormality of the cervical spine. Electronically signed by: Ryan Chess MD 07/26/2024 01:21 PM EST RP Workstation: [id number]     Procedures   Medications Ordered in the ED  amLODipine  (NORVASC ) tablet 10 mg (10 mg Oral Given 07/27/24 1038)  aspirin  chewable tablet 81 mg (81 mg Oral Given 07/27/24 1038)  atorvastatin  (LIPITOR ) tablet 80 mg (80 mg Oral Given 07/27/24 1037)  clopidogrel  (PLAVIX ) tablet 75 mg (75 mg Oral Given 07/27/24 1038)  dapagliflozin propanediol (FARXIGA) tablet 10 mg (10 mg Oral Given 07/27/24 1120)  escitalopram  (LEXAPRO ) tablet 10 mg (10 mg Oral Given 07/27/24 1039)  gabapentin  (NEURONTIN ) capsule 400 mg (400 mg Oral Given 07/27/24 1038)  insulin  glargine-yfgn (SEMGLEE ) injection 11 Units (11 Units Subcutaneous Given 07/26/24 2133)  metFORMIN  (GLUCOPHAGE -XR) 24 hr tablet 500 mg (has no administration in time range)  pantoprazole  (PROTONIX ) EC tablet 40 mg (40 mg Oral Given 07/27/24 1038)  QUEtiapine  (SEROQUEL ) tablet 100 mg (100 mg Oral Given 07/26/24 2133)  QUEtiapine  (SEROQUEL ) tablet 25 mg (25 mg Oral Given 07/27/24 1037)  ranolazine  (RANEXA ) 12 hr tablet 1,000 mg (1,000 mg Oral Given 07/27/24 1120)  cyanocobalamin  (VITAMIN B12) tablet 1,000 mcg (1,000 mcg Oral Given 07/27/24 1037)  ascorbic acid  (VITAMIN C ) tablet 500 mg (500 mg Oral Given 07/27/24 1038)  insulin  aspart (novoLOG ) injection 0-15 Units ( Subcutaneous Not Given 07/27/24 1129)  insulin  aspart  (novoLOG ) injection 4 Units (4 Units Subcutaneous Not Given 07/27/24 1129)  iohexol  (OMNIPAQUE ) 350 MG/ML injection 75 mL (75 mLs Intravenous Contrast Given 07/26/24 1316)    Clinical Course as of 07/27/24 1306  Fri Jul 26, 2024  1405 Broken screw at site of prior left intertrochanteric ORIF done by Dr. Toribio Higashi Orthopaedic Surgeon in 2023.  Will reach out to Ortho.  Subacute rib fractures no other traumatic findings  [MP]  1418 I did discuss with Ortho PA Reyes who reviewed this with Dr.Marchwiany.  The screw has been broken for a while.  Nothing else to do.  Patient was unable to ambulate without a lot of assistance from nursing tech.  States he walks with a cane at home.  He seems to be very unstable due to the shakiness in both legs.  We will keep him here overnight and plan on TOC/PT consult [MP]    Clinical Course User Index [MP] Pamella Ozell LABOR, DO                                 Medical Decision Making 78 year old male with history as above presenting to the ED after fall on Plavix  at home.  Also reports new  onset tremors in both legs contributing to his fall.  Does have a history of drug-induced parkinsonian as well as dementia.  Did report head injury but no LOC.  Left lower extremity appears shortened and rotated internally difficult to know if this is chronic status post intertrochanteric ORIF versus acute injury.  Hemodynamically stable.  Abdominal tenderness on my exam.  Will obtain CT head C-spine chest abdomen pelvis along with dedicated chest and pelvis left hip x-rays to evaluate for traumatic injury.  Will obtain EKG and laboratory workup as well to look for any underlying metabolic disturbance that could be contributing to increased tremors reported per patient.  Amount and/or Complexity of Data Reviewed Labs: ordered. Radiology: ordered.  Risk OTC drugs. Prescription drug management.        Final diagnoses:  Fall, initial encounter  Tremor    ED  Discharge Orders          Ordered    Home Health        07/27/24 1227    Face-to-face encounter (required for Medicare/Medicaid patients)       Comments: I Lynwood Moulder certify that this patient is under my care and that I, or a nurse practitioner or physician's assistant working with me, had a face-to-face encounter that meets the physician face-to-face encounter requirements with this patient on 07/27/2024. The encounter with the patient was in whole, or in part for the following medical condition(s) which is the primary reason for home health care (List medical condition): ambulatory dysfunction   07/27/24 1227               Jacarius, Handel, DO 07/26/24 1607    Pamella Ozell LABOR, DO 07/27/24 1306

## 2024-07-27 LAB — CBG MONITORING, ED
Glucose-Capillary: 137 mg/dL — ABNORMAL HIGH (ref 70–99)
Glucose-Capillary: 69 mg/dL — ABNORMAL LOW (ref 70–99)
Glucose-Capillary: 66 mg/dL — ABNORMAL LOW (ref 70–99)
Glucose-Capillary: 74 mg/dL (ref 70–99)

## 2024-07-27 NOTE — Evaluation (Signed)
 Occupational Therapy Evaluation Patient Details Name: Patrick Turner MRN: [id number] DOB: [birth year] Today's Date: 07/27/2024   History of Present Illness   Pt is a 78 y.o. male who presented 07/26/24 s/p fall with new onset tremors in bil legs. No acute traumatic findings with imaging 11/14. Physician reporting fracture of the distal interlocking screw of the left femoral intramedullary nail is an old finding and pt has no restrictions. PMH: anemia, arthritis, CAD, CRD, depression, DM, GERD, hypercholesteremia, HTN, neuropathy, severe obesity, sleep apnea, dementia, drug-induced parkinsonian     Clinical Impressions PTA, pt living with spouse and reports being independent in BADL and IADL. Upon eval pt mildly unsteady during gait; CGA for static activity in standing and up to min A for dynamic balance. Pt son present at end of session and OT highly encouraging use of RW in home setting. Suspect pt's cognition is at functional baseline. OT to sign off deferring further balance training to OP PT and pt discharging this afternoon.      If plan is discharge home, recommend the following:   A little help with walking and/or transfers;A little help with bathing/dressing/bathroom;Assistance with cooking/housework;Assist for transportation;Help with stairs or ramp for entrance     Functional Status Assessment   Patient has had a recent decline in their functional status and demonstrates the ability to make significant improvements in function in a reasonable and predictable amount of time.     Equipment Recommendations   None recommended by OT     Recommendations for Other Services         Precautions/Restrictions   Precautions Precautions: Fall Recall of Precautions/Restrictions: Intact Precaution/Restrictions Comments: reports 12-13 falls in past 6 months Restrictions Weight Bearing Restrictions Per Provider Order: No     Mobility Bed Mobility Overal bed mobility:  Needs Assistance Bed Mobility: Supine to Sit, Sit to Supine     Supine to sit: HOB elevated, Supervision Sit to supine: HOB elevated, Supervision   General bed mobility comments: Extra time, supervision for safety    Transfers Overall transfer level: Needs assistance Equipment used: Rolling walker (2 wheels) Transfers: Sit to/from Stand Sit to Stand: Contact guard assist           General transfer comment: Pt stood 1x from EOB and 1x from commode to RW without LOB, CGA for safety      Balance Overall balance assessment: Needs assistance Sitting-balance support: No upper extremity supported, Feet supported Sitting balance-Leahy Scale: Good     Standing balance support: Bilateral upper extremity supported, Single extremity supported, During functional activity, Reliant on assistive device for balance Standing balance-Leahy Scale: Poor Standing balance comment: reliant on UE support                           ADL either performed or assessed with clinical judgement   ADL Overall ADL's : Needs assistance/impaired Eating/Feeding: Independent;Sitting   Grooming: Contact guard assist;Standing   Upper Body Bathing: Set up;Sitting   Lower Body Bathing: Contact guard assist;Sit to/from stand   Upper Body Dressing : Set up;Sitting   Lower Body Dressing: Contact guard assist;Sit to/from stand   Toilet Transfer: Contact guard assist;Ambulation (SPC)   Toileting- Clothing Manipulation and Hygiene: Contact guard assist;Sit to/from stand       Functional mobility during ADLs: Contact guard assist;Cane       Vision Patient Visual Report: No change from baseline       Perception  Praxis         Pertinent Vitals/Pain Pain Assessment Pain Assessment: Faces Faces Pain Scale: Hurts little more Pain Location: legs Pain Descriptors / Indicators: Discomfort, Sore Pain Intervention(s): Monitored during session     Extremity/Trunk Assessment Upper  Extremity Assessment Upper Extremity Assessment: Generalized weakness   Lower Extremity Assessment Lower Extremity Assessment: Defer to PT evaluation   Cervical / Trunk Assessment Cervical / Trunk Assessment: Kyphotic   Communication Communication Communication: Impaired Factors Affecting Communication: Hearing impaired   Cognition Arousal: Alert Behavior During Therapy: WFL for tasks assessed/performed Cognition: No apparent impairments             OT - Cognition Comments: oriented, able to follow up to 2 step commands, coversate while getting dressed. Memory otherwise not formally assessed this session                 Following commands: Intact       Cueing  General Comments   Cueing Techniques: Verbal cues;Tactile cues  encouraged RW use. son present at end of session; highly recommended RW and reinforced pt is a high fall risk   Exercises     Shoulder Instructions      Home Living Family/patient expects to be discharged to:: Private residence Living Arrangements: Spouse/significant other Available Help at Discharge: Family;Available 24 hours/day;Neighbor;Friend(s) (wife cannot physically assist, but is around 24/7; friends and neighbor can assist physically PRN) Type of Home: House Home Access: Stairs to enter [employer] of Steps: 3 Entrance Stairs-Rails: Can reach both Home Layout: Two level;Able to live on main level with bedroom/bathroom;Bed/bath upstairs;1/2 bath on main level Alternate Level Stairs-Number of Steps: 12 Alternate Level Stairs-Rails: Right Bathroom Shower/Tub: Chief Strategy Officer: Standard     Home Equipment: [employer] (2 wheels);Cane - single point;Shower seat;Grab bars - tub/shower;Grab bars - toilet          Prior Functioning/Environment Prior Level of Function : Independent/Modified Independent;Driving             Mobility Comments: Uses SPC 80% of the time; x12-13 falls in the past  6 months per pt ADLs Comments: Independent/mod I for cleaning, cooking, ADLs, and med/financial management; just retired. pt reports he sometimes gets confused when he gets dizzy but otherwise does well for himself    OT Problem List: Decreased strength;Decreased activity tolerance;Impaired balance (sitting and/or standing);Decreased knowledge of use of DME or AE   OT Treatment/Interventions:        OT Goals(Current goals can be found in the care plan section)   Acute Rehab OT Goals Patient Stated Goal: get better OT Goal Formulation: With patient Time For Goal Achievement: 08/10/24 Potential to Achieve Goals: Good   OT Frequency:       Co-evaluation              AM-PAC OT 6 Clicks Daily Activity     Outcome Measure Help from another person eating meals?: None Help from another person taking care of personal grooming?: A Little Help from another person toileting, which includes using toliet, bedpan, or urinal?: A Little Help from another person bathing (including washing, rinsing, drying)?: A Little Help from another person to put on and taking off regular upper body clothing?: A Little Help from another person to put on and taking off regular lower body clothing?: A Little 6 Click Score: 19   End of Session Equipment Utilized During Treatment: Rolling walker (2 wheels);Gait belt Nurse Communication: Mobility status  Activity Tolerance:  Patient tolerated treatment well Patient left: in chair;Other (comment) (transport chair with RN rolling pt out)  OT Visit Diagnosis: Unsteadiness on feet (R26.81);Muscle weakness (generalized) (M62.81)                Time: 8399-8382 OT Time Calculation (min): 17 min Charges:  OT General Charges $OT Visit: 1 Visit OT Evaluation $OT Eval Low Complexity: 1 Low  Elma JONETTA Lebron FREDERICK, OTR/L [facility] Office: [phone]   Elma JONETTA Lebron 07/27/2024, 4:28 PM

## 2024-07-27 NOTE — ED Notes (Signed)
 Report given to Jordan RN in purple

## 2024-07-27 NOTE — ED Notes (Signed)
 Per patient, son to come pick him up.

## 2024-07-27 NOTE — Evaluation (Signed)
 Physical Therapy Evaluation Patient Details Name: Patrick Turner MRN: [id number] DOB: 11-28-1945 Today's Date: 07/27/2024  History of Present Illness  Pt is a 78 y.o. male who presented 07/26/24 s/p fall with new onset tremors in bil legs. No acute traumatic findings with imaging 11/14. Physician reporting fracture of the distal interlocking screw of the left femoral intramedullary nail is an old finding and pt has no restrictions. PMH: anemia, arthritis, CAD, CRD, depression, DM, GERD, hypercholesteremia, HTN, neuropathy, severe obesity, sleep apnea, dementia, drug-induced parkinsonian   Clinical Impression  Pt presents with condition above and deficits mentioned below, see PT Problem List. PTA, he was mod I using a SPC majority of the time, living with his wife in a 2-level house with 3 STE and x12 stairs up to the second level. The pt endorses x12-13 falls in the past 6 months. He currently displays deficits in generalized strength, balance, and activity tolerance. He often drifted and staggered to the L (L leg shorter than R likely resulting in L bias) when ambulating with R HHA to simulate use of a cane, needing minA to recover. He did not have any LOB and only needed CGA for safety when ambulating using a RW this date. He did not have any bouts of tremors this session and he reports he has not had any all day. He appears to be approaching his baseline. Educated pt on his risk for falls and recs to use a RW for all standing mobility at this time and to keep a phone on him at all times for his safety. He verbalized understanding. He could benefit from follow-up with OPPT to address his balance concerns and reduce his risk for further falls. Will continue to follow acutely.        If plan is discharge home, recommend the following: Assistance with cooking/housework;Help with stairs or ramp for entrance;Assist for transportation   Can travel by private vehicle        Equipment Recommendations  None recommended by PT  Recommendations for Other Services  OT consult    Functional Status Assessment Patient has had a recent decline in their functional status and demonstrates the ability to make significant improvements in function in a reasonable and predictable amount of time.     Precautions / Restrictions Precautions Precautions: Fall Recall of Precautions/Restrictions: Intact Precaution/Restrictions Comments: reports 12-13 falls in past 6 months Restrictions Weight Bearing Restrictions Per Provider Order: No      Mobility  Bed Mobility Overal bed mobility: Needs Assistance Bed Mobility: Supine to Sit, Sit to Supine     Supine to sit: HOB elevated, Supervision Sit to supine: HOB elevated, Supervision   General bed mobility comments: Extra time, supervision for safety    Transfers Overall transfer level: Needs assistance Equipment used: Rolling walker (2 wheels) Transfers: Sit to/from Stand Sit to Stand: Contact guard assist           General transfer comment: Pt stood 1x from EOB and 1x from commode to RW without LOB, CGA for safety    Ambulation/Gait Ambulation/Gait assistance: Contact guard assist, Min assist Gait Distance (Feet): 160 Feet (x2 bouts of ~160 ft > ~20 ft) Assistive device: Rolling walker (2 wheels), 1 person hand held assist Gait Pattern/deviations: Step-through pattern, Decreased step length - right, Decreased step length - left, Decreased stride length, Drifts right/left, Staggering left Gait velocity: reduced Gait velocity interpretation: <1.31 ft/sec, indicative of household ambulator   General Gait Details: When using the RW, the pt only  needed CGA for safety, no LOB noted. When using R HHA to simulate use of  his cane, he demonstrated increased L lateral drifting and staggering, needing intermittent minA to maintain his balance. 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, likely resulting in his L bias  Stairs            Wheelchair  Mobility     Tilt Bed    Modified Rankin (Stroke Patients Only)       Balance Overall balance assessment: Needs assistance Sitting-balance support: No upper extremity supported, Feet supported Sitting balance-Leahy Scale: Good     Standing balance support: Bilateral upper extremity supported, Single extremity supported, During functional activity, Reliant on assistive device for balance Standing balance-Leahy Scale: Poor Standing balance comment: reliant on UE support                             Pertinent Vitals/Pain Pain Assessment Pain Assessment: Faces Faces Pain Scale: Hurts little more Pain Location: legs Pain Descriptors / Indicators: Discomfort, Sore Pain Intervention(s): Limited activity within patient's tolerance, Monitored during session, Repositioned    Home Living Family/patient expects to be discharged to:: Private residence Living Arrangements: Spouse/significant other Available Help at Discharge: Family;Available 24 hours/day;Neighbor;Friend(s) (wife cannot physically assist, but is around 24/7; friends and neighbor can assist physically PRN) Type of Home: House Home Access: Stairs to enter Entrance Stairs-Rails: Can reach both Entrance Stairs-Number of Steps: 3 Alternate Level Stairs-Number of Steps: 12 Home Layout: Two level;Able to live on main level with bedroom/bathroom;Bed/bath upstairs;1/2 bath on main level Home Equipment: Rolling Walker (2 wheels);Cane - single point;Shower seat;Grab bars - tub/shower;Grab bars - toilet      Prior Function Prior Level of Function : Independent/Modified Independent;Driving             Mobility Comments: Uses SPC 80% of the time; x12-13 falls in the past 6 months per pt ADLs Comments: Independent/mod I for cleaning, cooking, ADLs, and med/financial management; just retired     Extremity/Trunk Assessment   Upper Extremity Assessment Upper Extremity Assessment: Defer to OT evaluation     Lower Extremity Assessment Lower Extremity Assessment: Generalized weakness;LLE deficits/detail (grossly 4- to 4; reports hx of peripheral neuropathy impacting sensation at inferior lower legs and feet) LLE Deficits / Details: 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 Follows cues well. Seems to recall events of hospitalization and even what room he was in last night with accuracy. Likely at baseline, but do see a hx of dementia in his chart Following commands: Intact       Cueing Cueing Techniques: Verbal cues, Tactile cues     General Comments General comments (skin integrity, edema, etc.): Educated pt on his risk for falls and recs to use a RW for all standing mobility at this time and to keep a phone on him at all times for his safety. He verbalized understanding.    Exercises     Assessment/Plan    PT Assessment Patient needs continued PT  services  PT Problem List Decreased strength;Decreased activity tolerance;Decreased balance;Decreased mobility;Impaired sensation       PT Treatment Interventions DME instruction;Gait training;Stair training;Functional mobility training;Therapeutic exercise;Therapeutic activities;Balance training;Neuromuscular re-education;Patient/family education    PT Goals (Current goals can be found in the Care Plan section)  Acute Rehab PT Goals Patient Stated Goal: to understand why he had tremors PT Goal Formulation: With patient Time For Goal Achievement: 08/10/24 Potential to Achieve Goals: Good    Frequency Min 1X/week      Co-evaluation               AM-PAC PT 6 Clicks Mobility  Outcome Measure Help needed turning from your back to your side while in a flat bed without using bedrails?: A Little Help needed moving from lying on your back to sitting on the side of a flat bed without using bedrails?: A Little Help needed moving to and from a bed to a chair (including a wheelchair)?: A Little Help needed standing up from a chair using your arms (e.g., wheelchair or bedside chair)?: A Little Help needed to walk in hospital room?: A Little Help needed climbing 3-5 steps with a railing? : A Little 6 Click Score: 18    End of Session Equipment Utilized During Treatment: Gait belt Activity Tolerance: Patient tolerated treatment well Patient left: in bed;with call bell/phone within reach Nurse Communication: Mobility status PT Visit Diagnosis: Unsteadiness on feet (R26.81);Other abnormalities of gait and mobility (R26.89);Muscle weakness (generalized) (M62.81);History of falling (Z91.81);Repeated falls (R29.6);Difficulty in walking, not elsewhere classified (R26.2)    Time: 9051-8972 PT Time Calculation (min) (ACUTE ONLY): 39 min   Charges:   PT Evaluation $PT Eval Low Complexity: 1 Low PT Treatments $Gait Training: 8-22 mins $Therapeutic Activity: 8-22 mins PT General Charges $$ ACUTE PT VISIT: 1 Visit         Theo Ferretti, PT, DPT Acute Rehabilitation Services  Office: [phone]   Theo CHRISTELLA Ferretti 07/27/2024, 11:17 AM

## 2024-07-27 NOTE — Progress Notes (Signed)
 PT Cancellation Note  Patient Details Name: Patrick Turner MRN: [id number] DOB: 15-Feb-1946   Cancelled Treatment:    Reason Eval/Treat Not Completed: (P) Other (comment). Imaging showed a fracture of the distal interlocking screw of the left femoral intramedullary nail. Sent a message to physician and awaiting response on whether pt has any mobility restrictions or not considering this finding. Will plan to follow-up as time permits.   Patrick Turner, PT, DPT Acute Rehabilitation Services  Office: [phone]    Patrick Turner 07/27/2024, 8:12 AM

## 2024-07-27 NOTE — ED Notes (Signed)
 Son arrived to take patient home. Pt taken via W/C to son's vehicle at ED entrance.

## 2024-07-27 NOTE — ED Notes (Signed)
 Repeat CBG 66; per Lawsing MD, give one more round of orange juice and peanut butter/graham crackers, recheck CBG again in 15 minutes. If still low, give amp of D50.

## 2024-07-27 NOTE — ED Notes (Signed)
 Message sent to pharmacy to request Farxiga and Ranexa .

## 2024-07-27 NOTE — ED Provider Notes (Addendum)
 Emergency Medicine Observation Re-evaluation Note  Patrick Turner is a 78 y.o. male, seen on rounds today.  Pt initially presented to the ED for complaints of Trauma Currently, the patient is resting.  Physical Exam  BP (!) 144/66   Pulse 61   Temp 98.2 F (36.8 C) (Oral)   Resp 18   SpO2 100%  Physical Exam General: NAD   ED Course / MDM  EKG:   I have reviewed the labs performed to date as well as medications administered while in observation.  Recent changes in the last 24 hours include Seen in the ED yesterday, no indication for admission seen after trauma eval. Seen by PT today, recommended outpatient PT.   Pt had questions regarding a new onset tremor. He states that since yesterday he has had involuntary twitching of his legs and arms. 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 Had a previous inpatient hospitalization where shaking, slowed speech, weakness, dropping things had previously been mentioned. MRI Brain had been performed and was reassuring and he was discharged. Tremor at the time was distractible and inconsistent. Per neuro 2023 notes, Given the distractible nature of his complaints, as well as the atypical character, I strongly suspect that there is a psychosomatic component at play. I discussed with him possible stressors, he has been out of work since January 5 when he broke his hip, and is very nervous about going back in April.  Plan  Current plan is for outpatient PT, home health order for PT placed. Discussed care of patient with on-call neurology, Dr. Michaela who agreed with a plan for outpatient neurology referral.  Pt stable for discharge.         Patrick Agent, MD 07/27/24 [phone]

## 2024-07-27 NOTE — ED Notes (Addendum)
 Pt calling son to bring his clothes and pick him up. Pt states he will be here in about 30 minutes.

## 2024-07-27 NOTE — ED Notes (Signed)
 Lunchtime CBG 69. Hypoglycemia order set used. Pt provided with 4 oz orange juice. Will recheck CBG. EDP made aware.

## 2024-07-27 NOTE — ED Notes (Signed)
 PT at bedside.

## 2024-07-30 LAB — CDS SEROLOGY

## 2024-07-31 DIAGNOSIS — R531 Weakness: Secondary | ICD-10-CM | POA: Diagnosis not present

## 2024-07-31 DIAGNOSIS — F03A3 Unspecified dementia, mild, with mood disturbance: Secondary | ICD-10-CM | POA: Diagnosis not present

## 2024-07-31 DIAGNOSIS — E1122 Type 2 diabetes mellitus with diabetic chronic kidney disease: Secondary | ICD-10-CM | POA: Diagnosis not present

## 2024-07-31 DIAGNOSIS — N183 Chronic kidney disease, stage 3 unspecified: Secondary | ICD-10-CM | POA: Diagnosis not present

## 2024-07-31 DIAGNOSIS — Z8781 Personal history of (healed) traumatic fracture: Secondary | ICD-10-CM | POA: Diagnosis not present

## 2024-07-31 DIAGNOSIS — Z09 Encounter for follow-up examination after completed treatment for conditions other than malignant neoplasm: Secondary | ICD-10-CM | POA: Diagnosis not present

## 2024-08-12 DIAGNOSIS — F02A2 Dementia in other diseases classified elsewhere, mild, with psychotic disturbance: Secondary | ICD-10-CM | POA: Diagnosis not present

## 2024-08-12 DIAGNOSIS — F418 Other specified anxiety disorders: Secondary | ICD-10-CM | POA: Diagnosis not present

## 2024-08-12 DIAGNOSIS — F332 Major depressive disorder, recurrent severe without psychotic features: Secondary | ICD-10-CM | POA: Diagnosis not present

## 2024-08-13 ENCOUNTER — Encounter (INDEPENDENT_AMBULATORY_CARE_PROVIDER_SITE_OTHER): Payer: Self-pay | Admitting: Vascular Surgery

## 2024-08-13 ENCOUNTER — Ambulatory Visit (INDEPENDENT_AMBULATORY_CARE_PROVIDER_SITE_OTHER): Admitting: Vascular Surgery

## 2024-08-13 VITALS — BP 147/83 | HR 72 | Resp 18 | Ht 61.0 in | Wt 171.0 lb

## 2024-08-13 DIAGNOSIS — K551 Chronic vascular disorders of intestine: Secondary | ICD-10-CM | POA: Diagnosis not present

## 2024-08-13 DIAGNOSIS — E782 Mixed hyperlipidemia: Secondary | ICD-10-CM

## 2024-08-13 DIAGNOSIS — I10 Essential (primary) hypertension: Secondary | ICD-10-CM | POA: Diagnosis not present

## 2024-08-13 DIAGNOSIS — E1159 Type 2 diabetes mellitus with other circulatory complications: Secondary | ICD-10-CM | POA: Diagnosis not present

## 2024-08-13 DIAGNOSIS — N1832 Chronic kidney disease, stage 3b: Secondary | ICD-10-CM

## 2024-08-13 NOTE — Progress Notes (Signed)
 MRN : [id number]  Patrick Turner is a 78 y.o. (11/21/1945) male who presents with chief complaint of  Chief Complaint  Patient presents with   Follow-up    Mesenteric follow up  .  History of Present Illness:  Discussed the use of AI scribe software for clinical note transcription with the patient, who gave verbal consent to proceed.  History of Present Illness Patrick Turner is a 78 year old male with a history of mesenteric artery stenting who presents with abdominal pain after eating.  He develops abdominal pain after full meals, while small meals do not cause symptoms. He has not eaten today and is pain free in clinic. The pain has been severe, once described as a sudden intense episode that caused him to fall at work and seek hospital care, and it has limited his ability to work. An abdominal ultrasound was done about three weeks ago, and his last mesenteric intervention was over a year ago.  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 Another complicating feature is the fact that with his fall, he has developed severe left hip and leg pain.  He is to see the orthopedic specialist later this week.    Results RADIOLOGY Abdominal ultrasound: Increased velocities within the superior mesenteric artery stent indicating worsening recurrent stenosis. (07/23/2024)  Current Outpatient Medications  Medication Sig Dispense Refill   amLODipine  (NORVASC ) 10 MG tablet Take 10 mg by mouth daily.     ascorbic acid  (VITAMIN C ) 500 MG tablet Take 500 mg by mouth daily.     aspirin  81 MG tablet Take 1 tablet (81 mg total) by mouth daily.     atorvastatin  (LIPITOR ) 80 MG tablet Take 80 mg by mouth daily.     Cholecalciferol  (VITAMIN D3) 10 MCG (400 UNIT) tablet Take 400 Units by mouth daily.     clopidogrel  (PLAVIX ) 75 MG tablet TAKE ONE TABLET BY MOUTH  ONCE DAILY 90 tablet 1   dapagliflozin  propanediol (FARXIGA ) 10 MG TABS tablet Take 10 mg by mouth daily.     escitalopram  (LEXAPRO ) 10 MG tablet Take 10 mg by mouth daily.     gabapentin  (NEURONTIN ) 400 MG capsule Take 400 mg by mouth 2 (two) times daily.     insulin  glargine (LANTUS ) 100 UNIT/ML injection Inject 11 Units into the skin daily.     metFORMIN  (GLUCOPHAGE -XR) 500 MG 24 hr tablet SMARTSIG:2.0 Tablet(s) By Mouth Twice Daily     pantoprazole  (PROTONIX ) 40 MG tablet Take 40 mg by mouth daily.     QUEtiapine  (SEROQUEL ) 100 MG tablet Take 100 mg by mouth at bedtime.     QUEtiapine  (SEROQUEL ) 25 MG tablet Take 1 tablet (25 mg total) by mouth in the morning. 30 tablet 2   ranolazine  (RANEXA ) 1000 MG SR tablet TAKE 1 TABLET TWICE DAILY 180 tablet 3   vitamin B-12 (CYANOCOBALAMIN ) 1000 MCG tablet Take 1,000 mcg by mouth daily.     No current facility-administered medications for this visit.    Past Medical History:  Diagnosis Date   Acute on chronic renal failure 03/23/2019   Acute on chronic renal insufficiency 05/30/2016   Altered mental status    Anemia    Anxiety    Aortic atherosclerosis    Arthritis    Coronary artery disease    CRD (chronic renal disease)    Depression    Diabetes  mellitus without complication (HCC)    Encephalopathy acute    GERD (gastroesophageal reflux disease)    Headache    Hypercholesteremia    Hypertension    Neuropathy    Severe obesity (HCC)    Sleep apnea    Sleep terror    per patient this year per patient     Past Surgical History:  Procedure Laterality Date   APPENDECTOMY     CIRCUMCISION     COLONOSCOPY     COLONOSCOPY WITH PROPOFOL  N/A 07/10/2018   Procedure: COLONOSCOPY WITH PROPOFOL ;  Surgeon: [name], MD;  Location: [facility] ENDOSCOPY;  Service: Endoscopy;  Laterality: N/A;   COLONOSCOPY WITH PROPOFOL  N/A 04/23/2019   Procedure: COLONOSCOPY WITH PROPOFOL ;  Surgeon: [name], MD;  Location: [facility] ENDOSCOPY;   Service: Endoscopy;  Laterality: N/A;   CORONARY STENT INTERVENTION N/A 10/03/2019   Procedure: CORONARY STENT INTERVENTION;  Surgeon: Florencio Cara BIRCH, MD;  Location: [facility] INVASIVE CV LAB;  Service: Cardiovascular;  Laterality: N/A;   ESOPHAGOGASTRODUODENOSCOPY     ESOPHAGOGASTRODUODENOSCOPY (EGD) WITH PROPOFOL  N/A 04/23/2019   Procedure: ESOPHAGOGASTRODUODENOSCOPY (EGD) WITH PROPOFOL ;  Surgeon: [name], MD;  Location: [facility] ENDOSCOPY;  Service: Endoscopy;  Laterality: N/A;   INTRAMEDULLARY (IM) NAIL INTERTROCHANTERIC Left 09/17/2021   Procedure: INTRAMEDULLARY (IM) NAIL INTERTROCHANTRIC;  Surgeon: [name], MD;  Location: [facility] OR;  Service: Orthopedics;  Laterality: Left;   JOINT REPLACEMENT     KNEE ARTHROPLASTY Right 06/27/2016   Procedure: COMPUTER ASSISTED TOTAL KNEE ARTHROPLASTY;  Surgeon: [name], MD;  Location: [facility] ORS;  Service: Orthopedics;  Laterality: Right;   KNEE ARTHROSCOPY Right    LEFT HEART CATH AND CORONARY ANGIOGRAPHY N/A 10/03/2019   Procedure: Left Heart Cath and possible Coronary intervention;  Surgeon: Fernand Denyse LABOR, MD;  Location: [facility] INVASIVE CV LAB;  Service: Cardiovascular;  Laterality: N/A;   TONSILLECTOMY     VISCERAL ANGIOGRAPHY N/A 05/23/2019   Procedure: VISCERAL ANGIOGRAPHY;  Surgeon: [name], MD;  Location: [facility] INVASIVE CV LAB;  Service: Cardiovascular;  Laterality: N/A;   VISCERAL ANGIOGRAPHY N/A 11/04/2019   Procedure: VISCERAL ANGIOGRAPHY;  Surgeon: [name], MD;  Location: [facility] INVASIVE CV LAB;  Service: Cardiovascular;  Laterality: N/A;   VISCERAL ANGIOGRAPHY N/A 12/14/2021   Procedure: VISCERAL ANGIOGRAPHY;  Surgeon: [name], MD;  Location: [facility] INVASIVE CV LAB;  Service: Cardiovascular;  Laterality: N/A;     Social History   Tobacco Use   Smoking status: Never   Smokeless tobacco: Never  Vaping Use   Vaping status: Never Used  Substance Use Topics   Alcohol use: Yes    Comment: rare   Drug use: No       Family History  Problem Relation Age of Onset   Diabetes Mother    Heart disease Mother    Cancer Father    COPD Father      Allergies  Allergen Reactions   Penicillins Anaphylaxis    Has patient had a PCN reaction causing immediate rash, facial/tongue/throat swelling, SOB or lightheadedness with hypotension: Yes Has patient had a PCN reaction causing severe rash involving mucus membranes or skin necrosis: No Has patient had a PCN reaction that required hospitalization Yes Has patient had a PCN reaction occurring within the last 10 years: No If all of the above answers are NO, then may proceed with Cephalosporin use.   Tavist [Clemastine] Swelling     REVIEW OF SYSTEMS (Negative unless checked)   Constitutional: [] Weight loss  []   Fever  [] Chills Cardiac: [] Chest pain   [] Chest pressure   [] Palpitations   [] Shortness of breath when laying flat   [] Shortness of breath at rest   [] Shortness of breath with exertion. Vascular:  [] Pain in legs with walking   [] Pain in legs at rest   [] Pain in legs when laying flat   [] Claudication   [] Pain in feet when walking  [] Pain in feet at rest  [] Pain in feet when laying flat   [] History of DVT   [] Phlebitis   [] Swelling in legs   [] Varicose veins   [] Non-healing ulcers Pulmonary:   [] Uses home oxygen   [] Productive cough   [] Hemoptysis   [] Wheeze  [] COPD   [] Asthma Neurologic:  [] Dizziness  [] Blackouts   [] Seizures   [] History of stroke   [] History of TIA  [] Aphasia   [] Temporary blindness   [] Dysphagia   [] Weakness or numbness in arms   [x] Weakness or numbness in legs Musculoskeletal:  [x] Arthritis   [] Joint swelling   [x] Joint pain   [] Low back pain Hematologic:  [] Easy bruising  [] Easy bleeding   [] Hypercoagulable state   [] Anemic   Gastrointestinal:  [] Blood in stool   [] Vomiting blood  [x] Gastroesophageal reflux/heartburn   [x] Abdominal pain Genitourinary:  [] Chronic kidney disease   [] Difficult urination  [] Frequent urination   [] Burning with urination   [] Hematuria Skin:  [] Rashes   [] Ulcers   [] Wounds Psychological:  [] History of anxiety   []  History of major depression.  Physical Examination  BP (!) 147/83 (BP Location: Left Arm, Patient Position: Sitting, Cuff Size: Normal)   Pulse 72   Resp 18   Ht 5' 1 (1.549 m)   Wt 171 lb (77.6 kg)   BMI 32.31 kg/m  Gen:  WD/WN, NAD Head: [place]/AT, No temporalis wasting. Ear/Nose/Throat: Hearing grossly intact, nares w/o erythema or drainage Eyes: Conjunctiva clear. Sclera non-icteric Neck: Supple.  Trachea midline Pulmonary:  Good air movement, no use of accessory muscles.  Cardiac: RRR, no JVD Vascular:  Vessel Right Left  Radial Palpable Palpable                                   Gastrointestinal: soft, non-tender/non-distended. No guarding/reflex.  Musculoskeletal: M/S 5/5 throughout.  No deformity or atrophy. Trace LE edema. Walking with a cane Neurologic: Sensation grossly intact in extremities.  Symmetrical.  Speech is fluent.  Psychiatric: Judgment intact, Mood & affect appropriate for pt's clinical situation. Dermatologic: No rashes or ulcers noted.  No cellulitis or open wounds.  Physical Exam     Labs Recent Results (from the past 2160 hours)  Lipase, blood     Status: None   Collection Time: 06/21/24 12:57 AM  Result Value Ref Range   Lipase 48 11 - 51 U/L    Comment: Performed at [facility], [address] [place], [place] [address]  Comprehensive metabolic panel     Status: Abnormal   Collection Time: 06/21/24 12:57 AM  Result Value Ref Range   Sodium 135 135 - 145 mmol/L   Potassium 4.3 3.5 - 5.1 mmol/L   Chloride 101 98 - 111 mmol/L   CO2 23 22 - 32 mmol/L   Glucose, Bld 265 (H) 70 - 99 mg/dL    Comment: Glucose reference range applies only to samples taken after fasting for at least 8 hours.   BUN 40 (H) 8 - 23 mg/dL   Creatinine, Ser 8.06 (H)  0.61 - 1.24 mg/dL   Calcium  8.6 (L) 8.9 - 10.3 mg/dL    Total Protein 6.3 (L) 6.5 - 8.1 g/dL   Albumin  3.6 3.5 - 5.0 g/dL   AST 19 15 - 41 U/L   ALT 12 0 - 44 U/L   Alkaline Phosphatase 68 38 - 126 U/L   Total Bilirubin 0.8 0.0 - 1.2 mg/dL   GFR, Estimated 35 (L) >60 mL/min    Comment: (NOTE) Calculated using the CKD-EPI Creatinine Equation (2021)    Anion gap 11 5 - 15    Comment: Performed at [facility], [address] [place], [place] [address]  CBC     Status: Abnormal   Collection Time: 06/21/24 12:57 AM  Result Value Ref Range   WBC 7.2 4.0 - 10.5 K/uL   RBC 3.62 (L) 4.22 - 5.81 MIL/uL   Hemoglobin 11.6 (L) 13.0 - 17.0 g/dL   HCT 64.7 (L) 60.9 - 47.9 %   MCV 97.2 80.0 - 100.0 fL   MCH 32.0 26.0 - 34.0 pg   MCHC 33.0 30.0 - 36.0 g/dL   RDW 86.7 88.4 - 84.4 %   Platelets 239 150 - 400 K/uL   nRBC 0.0 0.0 - 0.2 %    Comment: Performed at [facility], [address] [place], [place] [address]  Troponin I (High Sensitivity)     Status: None   Collection Time: 06/21/24 12:57 AM  Result Value Ref Range   Troponin I (High Sensitivity) 6 <18 ng/L    Comment: (NOTE) Elevated high sensitivity troponin I (hsTnI) values and significant  changes across serial measurements may suggest ACS but many other  chronic and acute conditions are known to elevate hsTnI results.  Refer to the Links section for chest pain algorithms and additional  guidance. Performed at [facility], [address] [place], [place] [address]   Urinalysis, Routine w reflex microscopic -Urine, Clean Catch     Status: Abnormal   Collection Time: 06/21/24  4:45 AM  Result Value Ref Range   Color, Urine YELLOW (A) YELLOW   APPearance HAZY (A) CLEAR   Specific Gravity, Urine 1.024 1.005 - 1.030   pH 5.0 5.0 - 8.0   Glucose, UA >=500 (A) NEGATIVE mg/dL   Hgb urine dipstick NEGATIVE NEGATIVE   Bilirubin Urine NEGATIVE NEGATIVE   Ketones, ur NEGATIVE NEGATIVE mg/dL   Protein, ur NEGATIVE NEGATIVE mg/dL   Nitrite NEGATIVE  NEGATIVE   Leukocytes,Ua NEGATIVE NEGATIVE   RBC / HPF 0-5 0 - 5 RBC/hpf   WBC, UA 0-5 0 - 5 WBC/hpf   Bacteria, UA NONE SEEN NONE SEEN   Squamous Epithelial / HPF 0-5 0 - 5 /HPF   Sperm, UA PRESENT     Comment: Performed at [facility], [address] [place], [place] [address]  Lactic acid, plasma     Status: None   Collection Time: 06/21/24  6:39 AM  Result Value Ref Range   Lactic Acid, Venous 0.9 0.5 - 1.9 mmol/L    Comment: Performed at [facility], [address] [place], [place] [address]  Creatinine, serum     Status: Abnormal   Collection Time: 06/21/24 10:35 AM  Result Value Ref Range   Creatinine, Ser 1.66 (H) 0.61 - 1.24 mg/dL   GFR, Estimated 42 (L) >60 mL/min    Comment: (NOTE) Calculated using the CKD-EPI Creatinine Equation (2021) Performed at [facility], [address], [place], [place] [address]  Magnesium      Status: None   Collection Time: 06/21/24 10:35 AM  Result Value Ref Range   Magnesium  1.8 1.7 - 2.4 mg/dL    Comment: Performed at [facility], [address] [place], [place] [address]  Sample to Blood Bank     Status: None   Collection Time: 07/26/24 12:25 PM  Result Value Ref Range   Blood Bank Specimen SAMPLE AVAILABLE FOR TESTING    Sample Expiration      07/29/2024,2359 Performed at [facility], [address], [place], [place] [address]   CDS serology     Status: None   Collection Time: 07/26/24 12:35 PM  Result Value Ref Range   CDS serology specimen      SPECIMEN WILL BE HELD FOR 14 DAYS IF TESTING IS REQUIRED    Comment: Performed at [facility], [address], [place], [place] [address]  Comprehensive metabolic panel     Status: Abnormal   Collection Time: 07/26/24 12:35 PM  Result Value Ref Range   Sodium 139 135 - 145 mmol/L   Potassium 4.6 3.5 - 5.1 mmol/L   Chloride 101 98 - 111 mmol/L   CO2 22 22 - 32 mmol/L   Glucose, Bld 159 (H) 70 - 99 mg/dL    Comment: Glucose  reference range applies only to samples taken after fasting for at least 8 hours.   BUN 31 (H) 8 - 23 mg/dL   Creatinine, Ser 8.14 (H) 0.61 - 1.24 mg/dL   Calcium  8.8 (L) 8.9 - 10.3 mg/dL   Total Protein 6.1 (L) 6.5 - 8.1 g/dL   Albumin  3.5 3.5 - 5.0 g/dL   AST 19 15 - 41 U/L   ALT 13 0 - 44 U/L   Alkaline Phosphatase 61 38 - 126 U/L   Total Bilirubin 0.7 0.0 - 1.2 mg/dL   GFR, Estimated 37 (L) >60 mL/min    Comment: (NOTE) Calculated using the CKD-EPI Creatinine Equation (2021)    Anion gap 16 (H) 5 - 15    Comment: Performed at [facility], [address], [place], [place] [address]  CBC     Status: Abnormal   Collection Time: 07/26/24 12:35 PM  Result Value Ref Range   WBC 10.1 4.0 - 10.5 K/uL   RBC 3.81 (L) 4.22 - 5.81 MIL/uL   Hemoglobin 12.1 (L) 13.0 - 17.0 g/dL   HCT 62.9 (L) 60.9 - 47.9 %   MCV 97.1 80.0 - 100.0 fL   MCH 31.8 26.0 - 34.0 pg   MCHC 32.7 30.0 - 36.0 g/dL   RDW 86.0 88.4 - 84.4 %   Platelets 237 150 - 400 K/uL   nRBC 0.0 0.0 - 0.2 %    Comment: Performed at [facility], [address], [place], [place] [address]  Ethanol     Status: None   Collection Time: 07/26/24 12:35 PM  Result Value Ref Range   Alcohol, Ethyl (B) <15 <15 mg/dL    Comment: (NOTE) For medical purposes only. Performed at [facility], [address] [place], [place] [address]   Protime-INR     Status: None   Collection Time: 07/26/24 12:35 PM  Result Value Ref Range   Prothrombin Time 13.9 11.4 - 15.2 seconds   INR 1.0 0.8 - 1.2    Comment: (NOTE) INR goal varies based on device and disease states. Performed at [facility], [address], [place], [place] [address]   POC CBG, ED     Status: None   Collection Time: 07/26/24  6:44 PM  Result Value Ref Range   Glucose-Capillary 89 70 - 99 mg/dL    Comment: Glucose reference range applies only to samples taken after fasting for at least 8 hours.  POC CBG, ED     Status: Abnormal   Collection Time:  07/27/24  8:12 AM  Result Value Ref Range   Glucose-Capillary 137 (H) 70 - 99 mg/dL    Comment: Glucose reference range applies only to samples taken after fasting for at least 8 hours.  POC CBG, ED     Status: Abnormal   Collection Time: 07/27/24 11:24 AM  Result Value Ref Range   Glucose-Capillary 69 (L) 70 - 99 mg/dL    Comment: Glucose reference range applies only to samples taken after fasting for at least 8 hours.  CBG monitoring, ED     Status: Abnormal   Collection Time: 07/27/24 11:41 AM  Result Value Ref Range   Glucose-Capillary 66 (L) 70 - 99 mg/dL    Comment: Glucose reference range applies only to samples taken after fasting for at least 8 hours.  CBG monitoring, ED     Status: None   Collection Time: 07/27/24 12:03 PM  Result Value Ref Range   Glucose-Capillary 74 70 - 99 mg/dL    Comment: Glucose reference range applies only to samples taken after fasting for at least 8 hours.    Radiology VAS US  MESENTERIC Result Date: 07/29/2024 ABDOMINAL VISCERAL Patient Name:  Patrick Turner  Date of Exam:   07/24/2024 Medical Rec #: [id number]         Accession #:    [id number] Date of Birth: 07-Aug-1946         Patient Gender: M Patient Age:   42 years Exam Location:  [facility] Procedure:      VAS US  MESENTERIC Referring Phys: [id number] SELINDA RAMAN Patrick Turner -------------------------------------------------------------------------------- Indications: Celiac/SMA stenosis. Left sided pain High Risk Factors: Hypertension. Vascular Interventions: 12/14/2021 PTA of previous SMA stent                          11/04/2019 PTA of celiac artery. Comparison Study: 06/04/2024 Performing Technologist: Jerel Croak RVT  Examination Guidelines: A complete evaluation includes B-mode imaging, spectral Doppler, color Doppler, and power Doppler as needed of all accessible portions of each vessel. Bilateral testing is considered an integral part of a complete examination. Limited examinations for reoccurring  indications may be performed as noted.  Duplex Findings: +----------------------+--------+--------+------+--------+ Mesenteric            PSV cm/sEDV cm/sPlaqueComments +----------------------+--------+--------+------+--------+ Aorta Mid                62                          +----------------------+--------+--------+------+--------+ Celiac Artery Origin    139                          +----------------------+--------+--------+------+--------+ Celiac Artery Proximal  139                          +----------------------+--------+--------+------+--------+ SMA Proximal            325                          +----------------------+--------+--------+------+--------+  SMA Mid                 211                          +----------------------+--------+--------+------+--------+ CHA                     163                          +----------------------+--------+--------+------+--------+ Splenic                 124                          +----------------------+--------+--------+------+--------+ IMA                     138                          +----------------------+--------+--------+------+--------+    Summary: Mesenteric: Normal Celiac artery , Inferior Mesenteric artery, Splenic artery and Hepatic artery findings. 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 Increased velocities again seen in the proximal SMA/stent region which have worsened compared to previous study of 06/07/2024.  *See table(s) above for measurements and observations.  Diagnosing physician: Selinda Gu MD  Electronically signed by Selinda Gu MD on 07/29/2024 at 9:19:14 AM.    Final    CT CHEST ABDOMEN PELVIS W CONTRAST Result Date: 07/26/2024 CLINICAL DATA:  Fall.  Abdominal tenderness. EXAM: CT CHEST, ABDOMEN, AND PELVIS WITH CONTRAST TECHNIQUE: Multidetector CT imaging of the chest, abdomen and pelvis was performed following the standard protocol during bolus administration  of intravenous contrast. RADIATION DOSE REDUCTION: This exam was performed according to the departmental dose-optimization program which includes automated exposure control, adjustment of the mA and/or kV according to patient size and/or use of iterative reconstruction technique. CONTRAST:  75mL OMNIPAQUE  IOHEXOL  350 MG/ML SOLN COMPARISON:  CT abdomen/pelvis dated 06/21/2024. CT chest dated 10/23/2020. FINDINGS: CT CHEST FINDINGS Cardiovascular: Heart size is within normal limits. No pericardial effusion. Multivessel coronary artery calcifications. Nonaneurysmal thoracic aorta with atherosclerotic calcification. Mediastinum/Nodes: No enlarged mediastinal, hilar, or axillary lymph nodes. Thyroid  gland, trachea, and esophagus demonstrate no significant findings. Lungs/Pleura: Lungs are generally well aerated. Minimal linear scarring/atelectasis in the lingula and left lung base. No focal consolidation. No pleural effusion or pneumothorax. Musculoskeletal: Subacute versus chronic nondisplaced fracture of the right lateral tenth rib, near the costochondral junction, with mild marginal callus formation. No appreciable acute fracture. No acute chest wall abnormality. CT ABDOMEN PELVIS FINDINGS Hepatobiliary: No hepatic injury or perihepatic hematoma. Gallbladder is unremarkable. Pancreas: Unremarkable. No pancreatic ductal dilatation or surrounding inflammatory changes. Spleen: Normal in size without focal abnormality. Adrenals/Urinary Tract: No adrenal hemorrhage or renal injury identified. No urolithiasis or hydronephrosis. Bladder is unremarkable. Stomach/Bowel: Stomach is within normal limits. Status post appendectomy. No obstruction or inflammatory changes. Sigmoid colonic diverticulosis without evidence of acute diverticulitis. Large stool burden throughout the transverse, descending, and rectosigmoid colon. Vascular/Lymphatic: Nonaneurysmal abdominal aorta with atherosclerotic calcification. Superior mesenteric  artery stent. No enlarged abdominal or pelvic lymph nodes. Reproductive: Similar mild enlargement of the prostate gland with median lobe hypertrophy. Other: No abdominal wall hernia or abnormality. No abdominopelvic ascites. Musculoskeletal: No acute osseous abnormality. Postoperative changes related to ORIF of the left femur. Multilevel degenerative disc changes of the lumbar spine  with similar grade 1 anterolisthesis of L4 on L5 and stepwise retrolisthesis of L1 on L2 and L2 on L3. IMPRESSION: 1. No acute traumatic findings in the chest, abdomen, or pelvis. 2. Subacute versus chronic nondisplaced fracture of the right lateral tenth rib, near the costochondral junction. 3. Large stool burden throughout the transverse, descending, and rectosigmoid colon. 4. Sigmoid colonic diverticulosis without evidence of acute diverticulitis. 5. Additional unchanged non-acute findings, as described above. 6.  Aortic Atherosclerosis (ICD10-I70.0). Electronically Signed   By: Harrietta Sherry M.D.   On: 07/26/2024 14:01   DG Hip Unilat W or Wo Pelvis 2-3 Views Left Result Date: 07/26/2024 EXAM: 2 or more VIEW(S) XRAY OF THE LEFT HIP 07/26/2024 12:58:00 PM COMPARISON: None available. CLINICAL HISTORY: fall FINDINGS: BONES AND JOINTS: Intramedullary nail fixation of the left femur is in place. There is a fracture of the screw that transfixes the intramedullary nail to the distal femur. The hip joint is maintained. No significant degenerative changes of the hip joint. SOFT TISSUES: Vascular calcifications. LUMBAR SPINE: Degenerative changes of visualized lumbar spine. LEFT KNEE: At least moderate degenerative changes of the left knee. IMPRESSION: 1. Fracture of the distal interlocking screw of the left femoral intramedullary nail. Electronically signed by: Ryan Chess MD 07/26/2024 01:31 PM EST RP Workstation: [id number]   DG Pelvis Portable Result Date: 07/26/2024 EXAM: 1 or 2 VIEW(S) XRAY OF THE PELVIS 07/26/2024 12:58:00  PM COMPARISON: 06/30/2018 CLINICAL HISTORY: trauma FINDINGS: BONES AND JOINTS: Old healed left intertrochanteric fracture transfixed with intramedullary rod and interlocking compression screw. No acute fracture. No joint dislocation. SOFT TISSUES: Vascular calcifications. IMPRESSION: 1. No acute pelvic fracture. 2. Old healed left intertrochanteric fracture transfixed with intramedullary rod and interlocking compression screw. Electronically signed by: Ryan Chess MD 07/26/2024 01:29 PM EST RP Workstation: [id number]   DG Chest Portable 1 View Result Date: 07/26/2024 EXAM: 1 VIEW(S) XRAY OF THE CHEST 07/26/2024 12:58:00 PM COMPARISON: 05/04/2024 CLINICAL HISTORY: trauma FINDINGS: LUNGS AND PLEURA: Mildly decreased lung volumes. No focal pulmonary opacity. No pleural effusion. No pneumothorax. HEART AND MEDIASTINUM: Mild aortic arch calcification. Coronary artery stent noted. No acute abnormality of the cardiac and mediastinal silhouettes. BONES AND SOFT TISSUES: No acute osseous abnormality. IMPRESSION: 1. No acute cardiopulmonary process. 2. Aortic Atherosclerosis (ICD10-I70.0). Electronically signed by: Ryan Chess MD 07/26/2024 01:27 PM EST RP Workstation: [id number]   CT HEAD WO CONTRAST Result Date: 07/26/2024 EXAM: CT HEAD WITHOUT CONTRAST 07/26/2024 01:16:00 PM TECHNIQUE: CT of the head was performed without the administration of intravenous contrast. Automated exposure control, iterative reconstruction, and/or weight based adjustment of the mA/kV was utilized to reduce the radiation dose to as low as reasonably achievable. COMPARISON: 06/21/2024 CLINICAL HISTORY: Head trauma, moderate-severe. FINDINGS: BRAIN AND VENTRICLES: No acute hemorrhage. No evidence of acute infarct. No hydrocephalus. No extra-axial collection. No mass effect or midline shift. ORBITS: No acute abnormality. SINUSES: No acute abnormality. SOFT TISSUES AND SKULL: No acute soft tissue abnormality. No skull fracture.  IMPRESSION: 1. No acute intracranial abnormality. Electronically signed by: Ryan Chess MD 07/26/2024 01:22 PM EST RP Workstation: [id number]   CT CERVICAL SPINE WO CONTRAST Result Date: 07/26/2024 EXAM: CT CERVICAL SPINE WITHOUT CONTRAST 07/26/2024 01:16:00 PM TECHNIQUE: CT of the cervical spine was performed without the administration of intravenous contrast. Multiplanar reformatted images are provided for review. Automated exposure control, iterative reconstruction, and/or weight based adjustment of the mA/kV was utilized to reduce the radiation dose to as low as reasonably achievable. COMPARISON: CT cervical spine 05/04/2024. CLINICAL HISTORY: Polytrauma, blunt.  FINDINGS: CERVICAL SPINE: BONES AND ALIGNMENT: No acute fracture or traumatic malalignment. DEGENERATIVE CHANGES: Stable mild cervical spondylosis without high grade spinal canal stenosis. SOFT TISSUES: No prevertebral soft tissue swelling. IMPRESSION: 1. No acute abnormality of the cervical spine. 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 Symptoms severe but not urgent, allowing procedural timing flexibility. - Await orthopedic consultation to determine need for surgery, affecting mesenteric intervention timing. - Proceed with mesenteric artery intervention if orthopedic surgery not required.  If orthopedic surgery is required, I would delay the mesenteric intervention due to the need for anticoagulation/continued antiplatelets after the procedure. - Initiated preauthorization for mesenteric artery intervention.  HTN (hypertension), benign blood pressure control important in reducing the progression of atherosclerotic disease. On appropriate oral  medications.     DM type 2 with diabetic peripheral neuropathy (HCC) blood glucose control important in reducing the progression of atherosclerotic disease. Also, involved in wound healing. On appropriate medications.   HLD (hyperlipidemia) lipid control important in reducing the progression of atherosclerotic disease. Continue statin therapy   Selinda Gu, MD  08/13/2024 9:07 AM    This note was created with Dragon medical transcription system.  Any errors from dictation are purely unintentional

## 2024-08-13 NOTE — Patient Instructions (Signed)
 Chronic Mesenteric Ischemia  Mesenteric ischemia is poor blood flow in the vessels that supply blood to the stomach, intestines, and liver (mesenteric organs). Chronic mesenteric ischemia can happen when an artery that provides blood to the mesenteric organs becomes blocked or narrows over time. This restricts blood supply to those organs. What are the causes? This condition is often caused by fatty deposits that build up in an artery (plaque). Plaque can narrow the artery and restrict blood flow. Other causes include: Aneurysms. These are weakened areas in blood vessel walls. Conditions that cause twisting or inflammation of blood vessels. These include fibromuscular dysplasia and arteritis. Venous thrombosis. This is a disorder in which blood clots form in the veins. Scarring and thickening (fibrosis) of blood vessels. This may be caused by radiation therapy. Aortic dissection. This is a tear in the body's main artery (aorta). Blood vessel problems after illegal drug use, such as the use of cocaine. Neurofibromatosis. This is when there are tumors in your nervous system. Problems with the body's disease-fighting system (immune system), such as autoimmune diseases. These may include lupus. What increases the risk? You are more likely to develop this condition if you: Are male. Are over age 64, especially if you have a history of heart problems. Smoke. Have diabetes. Have problems with your heart and blood vessels. This may include: Congestive heart failure. An irregular heartbeat (arrhythmia). A history of heart attack or stroke. High blood pressure (hypertension). Have high cholesterol. Are overweight or obese. Have kidney disease that requires dialysis. What are the signs or symptoms? Symptoms of this condition include: Pain or cramps in the abdomen that start 15-60 minutes after a meal. This pain may last for 1-3 hours. Some people may develop a fear of eating because of this  symptom. Pain in the abdomen after stress or with exercise. Bloating. Diarrhea. Nausea or vomiting. Bloody stools. Weight loss. How is this diagnosed? This condition is diagnosed based on your medical history and a physical exam. You may also have other tests, including: Ultrasound. CT scan. Blood and urine tests. Angiogram. This is an imaging test that uses dye to get a picture of blood flow to the intestines. Gastric tonometry. This is a test to measure the output of carbon dioxide from your stomach and intestines. How is this treated? This condition may be treated with: Changes to your diet. This may mean eating small, low-fat meals more often. Changes to your lifestyle to treat problems that may be causing your condition or making it worse. Medicines to reduce blood clotting and increase blood flow. Surgery. This may involve: Angioplasty. This is surgery to widen the affected artery and reduce the blockage. A small, mesh tube (stent) may be inserted. Bypass surgery. This may be done to go around (bypass) the blockage and reconnect healthy arteries or veins. Placing a stent in the affected area. This may be done to help keep blocked arteries open. Follow these instructions at home: Medicines Take over-the-counter and prescription medicines only as told by your health care provider. If you are taking blood thinners: Talk with your health care provider before you take any medicines that contain aspirin or NSAIDs, such as ibuprofen. These medicines increase your risk for dangerous bleeding. Take your medicine exactly as told, at the same time every day. Avoid activities that could cause injury or bruising, and follow instructions about how to prevent falls. Wear a medical alert bracelet or carry a card that lists what medicines you take. Eating and drinking  Eat  a heart-healthy diet. This includes fresh fruits and vegetables, whole grains, and lean proteins like chicken, fish, and  beans. Avoid foods that contain a lot of: Salt (sodium). Sugar. Saturated fat, such as fats in red meat. Trans fat, usually found in fried foods. Drink enough fluid to keep your urine pale yellow. Lifestyle Stay active. Get regular exercise as told by your health care provider. Aim for 150 minutes of moderate activity or 75 minutes of vigorous activity a week. Ask your health care provider what forms of exercise are safe for you. Maintain a healthy weight. Work with your health care provider to manage your cholesterol. Manage any other health problems you have, such as high blood pressure, diabetes, or problems with your heart rhythm. General instructions Do not use any products that contain nicotine or tobacco. These products include cigarettes, chewing tobacco, and vaping devices, such as e-cigarettes. If you need help quitting, ask your health care provider. Your condition may cause constipation. To prevent or treat constipation, you may need to: Take over-the-counter or prescription medicines. Eat foods that are high in fiber, such as beans, whole grains, and fresh fruits and vegetables. Limit foods that are high in fat and processed sugars, such as fried or sweet foods. Contact a health care provider if: Your symptoms do not get better or come back after treatment. You have a fever. You have a fast or irregular heartbeat (palpitations). You have severe pain in abdomen. You are constipated. You have nausea, vomiting, or diarrhea that will not go away. You have blood in your stool. Get help right away if: You have shortness of breath. You have pain in your chest. You become very confused, drowsy, or you faint. You have numbness or weakness in your face, arm, or leg. You have trouble speaking or people have trouble understanding what you are saying. These symptoms may be an emergency. Get help right away. Call 911. Do not wait to see if the symptoms will go away. Do not drive  yourself to the hospital. This information is not intended to replace advice given to you by your health care provider. Make sure you discuss any questions you have with your health care provider. Document Revised: 01/26/2022 Document Reviewed: 01/26/2022 Elsevier Patient Education  2024 [employer].

## 2024-08-15 DIAGNOSIS — M7632 Iliotibial band syndrome, left leg: Secondary | ICD-10-CM | POA: Diagnosis not present

## 2024-08-15 DIAGNOSIS — M25552 Pain in left hip: Secondary | ICD-10-CM | POA: Diagnosis not present

## 2024-08-15 DIAGNOSIS — Z9889 Other specified postprocedural states: Secondary | ICD-10-CM | POA: Diagnosis not present

## 2024-08-20 DIAGNOSIS — E1142 Type 2 diabetes mellitus with diabetic polyneuropathy: Secondary | ICD-10-CM | POA: Diagnosis not present

## 2024-09-03 ENCOUNTER — Encounter: Payer: Self-pay | Admitting: Cardiovascular Disease

## 2024-09-03 ENCOUNTER — Ambulatory Visit: Admitting: Cardiovascular Disease

## 2024-09-03 VITALS — BP 139/80 | HR 72 | Ht 61.0 in | Wt 167.2 lb

## 2024-09-03 DIAGNOSIS — E782 Mixed hyperlipidemia: Secondary | ICD-10-CM

## 2024-09-03 DIAGNOSIS — R55 Syncope and collapse: Secondary | ICD-10-CM | POA: Diagnosis not present

## 2024-09-03 DIAGNOSIS — R001 Bradycardia, unspecified: Secondary | ICD-10-CM | POA: Diagnosis not present

## 2024-09-03 DIAGNOSIS — I6523 Occlusion and stenosis of bilateral carotid arteries: Secondary | ICD-10-CM

## 2024-09-03 DIAGNOSIS — N1832 Chronic kidney disease, stage 3b: Secondary | ICD-10-CM | POA: Diagnosis not present

## 2024-09-03 DIAGNOSIS — I2583 Coronary atherosclerosis due to lipid rich plaque: Secondary | ICD-10-CM | POA: Diagnosis not present

## 2024-09-03 DIAGNOSIS — I2510 Atherosclerotic heart disease of native coronary artery without angina pectoris: Secondary | ICD-10-CM | POA: Diagnosis not present

## 2024-09-03 DIAGNOSIS — N2889 Other specified disorders of kidney and ureter: Secondary | ICD-10-CM | POA: Diagnosis not present

## 2024-09-03 DIAGNOSIS — I350 Nonrheumatic aortic (valve) stenosis: Secondary | ICD-10-CM | POA: Diagnosis not present

## 2024-09-03 DIAGNOSIS — Z0130 Encounter for examination of blood pressure without abnormal findings: Secondary | ICD-10-CM

## 2024-09-03 NOTE — Progress Notes (Signed)
 "     Cardiology Office Note   Date:  09/03/2024   ID:  BRYSUN ESCHMANN, DOB 1946-09-03, MRN [id number]  PCP:  Lenon Layman ORN, MD  Cardiologist:  Denyse Bathe, MD      History of Present Illness: Patrick Turner is a 78 y.o. male who presents for  Chief Complaint  Patient presents with   Follow-up    2 month follow up. Pt had a fall 2 days ago and yesterday.     Has been falling.      Past Medical History:  Diagnosis Date   Acute on chronic renal failure 03/23/2019   Acute on chronic renal insufficiency 05/30/2016   Altered mental status    Anemia    Anxiety    Aortic atherosclerosis    Arthritis    Coronary artery disease    CRD (chronic renal disease)    Depression    Diabetes mellitus without complication (HCC)    Encephalopathy acute    GERD (gastroesophageal reflux disease)    Headache    Hypercholesteremia    Hypertension    Neuropathy    Severe obesity (HCC)    Sleep apnea    Sleep terror    per patient this year per patient      Past Surgical History:  Procedure Laterality Date   APPENDECTOMY     CIRCUMCISION     COLONOSCOPY     COLONOSCOPY WITH PROPOFOL  N/A 07/10/2018   Procedure: COLONOSCOPY WITH PROPOFOL ;  Surgeon: [name], MD;  Location: [facility] ENDOSCOPY;  Service: Endoscopy;  Laterality: N/A;   COLONOSCOPY WITH PROPOFOL  N/A 04/23/2019   Procedure: COLONOSCOPY WITH PROPOFOL ;  Surgeon: [name], MD;  Location: [facility] ENDOSCOPY;  Service: Endoscopy;  Laterality: N/A;   CORONARY STENT INTERVENTION N/A 10/03/2019   Procedure: CORONARY STENT INTERVENTION;  Surgeon: [name], MD;  Location: [facility] INVASIVE CV LAB;  Service: Cardiovascular;  Laterality: N/A;   ESOPHAGOGASTRODUODENOSCOPY     ESOPHAGOGASTRODUODENOSCOPY (EGD) WITH PROPOFOL  N/A 04/23/2019   Procedure: ESOPHAGOGASTRODUODENOSCOPY (EGD) WITH PROPOFOL ;  Surgeon: [name], MD;  Location: [facility] ENDOSCOPY;  Service: Endoscopy;  Laterality: N/A;    INTRAMEDULLARY (IM) NAIL INTERTROCHANTERIC Left 09/17/2021   Procedure: INTRAMEDULLARY (IM) NAIL INTERTROCHANTRIC;  Surgeon: [name], MD;  Location: [facility] OR;  Service: Orthopedics;  Laterality: Left;   JOINT REPLACEMENT     KNEE ARTHROPLASTY Right 06/27/2016   Procedure: COMPUTER ASSISTED TOTAL KNEE ARTHROPLASTY;  Surgeon: [name], MD;  Location: [facility] ORS;  Service: Orthopedics;  Laterality: Right;   KNEE ARTHROSCOPY Right    LEFT HEART CATH AND CORONARY ANGIOGRAPHY N/A 10/03/2019   Procedure: Left Heart Cath and possible Coronary intervention;  Surgeon: [name] LABOR, MD;  Location: [facility] INVASIVE CV LAB;  Service: Cardiovascular;  Laterality: N/A;   TONSILLECTOMY     VISCERAL ANGIOGRAPHY N/A 05/23/2019   Procedure: VISCERAL ANGIOGRAPHY;  Surgeon: [name], MD;  Location: [facility] INVASIVE CV LAB;  Service: Cardiovascular;  Laterality: N/A;   VISCERAL ANGIOGRAPHY N/A 11/04/2019   Procedure: VISCERAL ANGIOGRAPHY;  Surgeon: [name], MD;  Location: [facility] INVASIVE CV LAB;  Service: Cardiovascular;  Laterality: N/A;   VISCERAL ANGIOGRAPHY N/A 12/14/2021   Procedure: VISCERAL ANGIOGRAPHY;  Surgeon: [name], MD;  Location: [facility] INVASIVE CV LAB;  Service: Cardiovascular;  Laterality: N/A;     Current Outpatient Medications  Medication Sig Dispense Refill   amLODipine  (NORVASC ) 10 MG tablet Take 10 mg by mouth daily.  ascorbic acid  (VITAMIN C ) 500 MG tablet Take 500 mg by mouth daily.     aspirin  81 MG tablet Take 1 tablet (81 mg total) by mouth daily.     atorvastatin  (LIPITOR ) 80 MG tablet Take 80 mg by mouth daily.     Cholecalciferol  (VITAMIN D3) 10 MCG (400 UNIT) tablet Take 400 Units by mouth daily.     clopidogrel  (PLAVIX ) 75 MG tablet TAKE ONE TABLET BY MOUTH ONCE DAILY 90 tablet 1   dapagliflozin  propanediol (FARXIGA ) 10 MG TABS tablet Take 10 mg by mouth daily.     escitalopram  (LEXAPRO ) 10 MG tablet Take 10 mg by mouth daily.     gabapentin  (NEURONTIN ) 400 MG  capsule Take 400 mg by mouth 2 (two) times daily.     insulin  glargine (LANTUS ) 100 UNIT/ML injection Inject 11 Units into the skin daily.     metFORMIN  (GLUCOPHAGE -XR) 500 MG 24 hr tablet SMARTSIG:2.0 Tablet(s) By Mouth Twice Daily     pantoprazole  (PROTONIX ) 40 MG tablet Take 40 mg by mouth daily.     QUEtiapine  (SEROQUEL ) 100 MG tablet Take 100 mg by mouth at bedtime.     QUEtiapine  (SEROQUEL ) 25 MG tablet Take 1 tablet (25 mg total) by mouth in the morning. 30 tablet 2   ranolazine  (RANEXA ) 1000 MG SR tablet TAKE 1 TABLET TWICE DAILY 180 tablet 3   No current facility-administered medications for this visit.    Allergies:   Penicillins and Tavist [clemastine]    Social History:   reports that he has never smoked. He has never used smokeless tobacco. He reports current alcohol use. He reports that he does not use drugs.   Family History:  family history includes COPD in his father; Cancer in his father; Diabetes in his mother; Heart disease in his mother.    ROS:     Review of Systems  Constitutional: Negative.   HENT: Negative.    Eyes: Negative.   Respiratory: Negative.    Gastrointestinal: Negative.   Genitourinary: Negative.   Musculoskeletal: Negative.   Skin: Negative.   Neurological: Negative.   Endo/Heme/Allergies: Negative.   Psychiatric/Behavioral: Negative.    All other systems reviewed and are negative.     All other systems are reviewed and negative.    PHYSICAL EXAM: VS:  BP 139/80   Pulse 72   Ht 5' 1 (1.549 m)   Wt 167 lb 3.2 oz (75.8 kg)   SpO2 98%   BMI 31.59 kg/m  , BMI Body mass index is 31.59 kg/m. Last weight:  Wt Readings from Last 3 Encounters:  09/03/24 167 lb 3.2 oz (75.8 kg)  08/13/24 171 lb (77.6 kg)  07/05/24 174 lb (78.9 kg)     Physical Exam Vitals reviewed.  Constitutional:      Appearance: Normal appearance. He is normal weight.  HENT:     Head: Normocephalic.     Nose: Nose normal.     Mouth/Throat:     Mouth:  Mucous membranes are moist.  Eyes:     Pupils: Pupils are equal, round, and reactive to light.  Cardiovascular:     Rate and Rhythm: Normal rate and regular rhythm.     Pulses: Normal pulses.     Heart sounds: Normal heart sounds.  Pulmonary:     Effort: Pulmonary effort is normal.  Abdominal:     General: Abdomen is flat. Bowel sounds are normal.  Musculoskeletal:        General: Normal range of motion.  Cervical back: Normal range of motion.  Skin:    General: Skin is warm.  Neurological:     General: No focal deficit present.     Mental Status: He is alert.  Psychiatric:        Mood and Affect: Mood normal.       EKG:   Recent Labs: 05/04/2024: TSH 2.867 06/21/2024: Magnesium  1.8 07/26/2024: ALT 13; BUN 31; Creatinine, Ser 1.85; Hemoglobin 12.1; Platelets 237; Potassium 4.6; Sodium 139    Lipid Panel    Component Value Date/Time   CHOL 88 02/08/2023 0430   TRIG 99 02/08/2023 0430   HDL 40 (L) 02/08/2023 0430   CHOLHDL 2.2 02/08/2023 0430   VLDL 20 02/08/2023 0430   LDLCALC 28 02/08/2023 0430      Other studies Reviewed: Additional studies/ records that were reviewed today include:  Review of the above records demonstrates:       No data to display            ASSESSMENT AND PLAN:    ICD-10-CM   1. Syncope, unspecified syncope type  R55    BP much higher , but has falls as hypotension causing fall.    2. Nonrheumatic aortic valve stenosis  I35.0     3. Chronic kidney disease (CKD) stage G3b/A2, moderately decreased glomerular filtration rate (GFR) between 30-44 mL/min/1.73 square meter and albuminuria creatinine ratio between 30-299 mg/g (HCC)  N18.32     4. Mixed hyperlipidemia  E78.2     5. Sinus bradycardia  R00.1     6. Bilateral carotid artery stenosis  I65.23     7. Chronic renal impairment, stage 3 (moderate), unspecified whether stage 3a or 3b CKD  N28.89     8. Coronary artery disease due to lipid rich plaque  I25.10    I25.83      9. Severe obesity (BMI 35.0-39.9) with comorbidity (HCC)  E66.01        Problem List Items Addressed This Visit       Cardiovascular and Mediastinum   Bilateral carotid artery disease   CAD (coronary artery disease)   Nonrheumatic aortic valve stenosis     Genitourinary   Chronic kidney disease (CKD) stage G3b/A2, moderately decreased glomerular filtration rate (GFR) between 30-44 mL/min/1.73 square meter and albuminuria creatinine ratio between 30-299 mg/g (HCC)     Other   HLD (hyperlipidemia)   Other Visit Diagnoses       Syncope, unspecified syncope type    -  Primary   BP much higher , but has falls as hypotension causing fall.     Sinus bradycardia         Chronic renal impairment, stage 3 (moderate), unspecified whether stage 3a or 3b CKD         Severe obesity (BMI 35.0-39.9) with comorbidity (HCC)              Disposition:   Return in about 6 weeks (around 10/15/2024).    Total time spent: 40 minutes  Signed,  Denyse Bathe, MD  09/03/2024 11:22 AM    [facility] "

## 2024-10-15 ENCOUNTER — Ambulatory Visit: Admitting: Cardiovascular Disease

## 2024-10-15 ENCOUNTER — Encounter: Payer: Self-pay | Admitting: Cardiovascular Disease

## 2024-10-15 VITALS — BP 124/62 | HR 79 | Ht 61.0 in | Wt 156.0 lb

## 2024-10-15 DIAGNOSIS — R42 Dizziness and giddiness: Secondary | ICD-10-CM

## 2024-10-15 DIAGNOSIS — I2510 Atherosclerotic heart disease of native coronary artery without angina pectoris: Secondary | ICD-10-CM | POA: Diagnosis not present

## 2024-10-15 DIAGNOSIS — R55 Syncope and collapse: Secondary | ICD-10-CM | POA: Diagnosis not present

## 2024-10-15 DIAGNOSIS — R001 Bradycardia, unspecified: Secondary | ICD-10-CM | POA: Diagnosis not present

## 2024-10-15 DIAGNOSIS — E782 Mixed hyperlipidemia: Secondary | ICD-10-CM | POA: Diagnosis not present

## 2024-10-15 DIAGNOSIS — I6523 Occlusion and stenosis of bilateral carotid arteries: Secondary | ICD-10-CM | POA: Diagnosis not present

## 2024-10-15 DIAGNOSIS — N1832 Chronic kidney disease, stage 3b: Secondary | ICD-10-CM

## 2024-10-15 DIAGNOSIS — I2583 Coronary atherosclerosis due to lipid rich plaque: Secondary | ICD-10-CM

## 2024-10-15 DIAGNOSIS — G4733 Obstructive sleep apnea (adult) (pediatric): Secondary | ICD-10-CM

## 2024-10-15 DIAGNOSIS — I350 Nonrheumatic aortic (valve) stenosis: Secondary | ICD-10-CM

## 2024-10-15 DIAGNOSIS — N2889 Other specified disorders of kidney and ureter: Secondary | ICD-10-CM

## 2024-10-15 DIAGNOSIS — R0602 Shortness of breath: Secondary | ICD-10-CM | POA: Diagnosis not present

## 2024-10-18 ENCOUNTER — Telehealth (INDEPENDENT_AMBULATORY_CARE_PROVIDER_SITE_OTHER): Payer: Self-pay

## 2024-10-18 NOTE — Telephone Encounter (Signed)
 Spoke with the patient and he is scheduled with Dr. Marea for a mesenteric angio on 10/28/24 with a 12:00 pm arrival time to the [facility]. Pre-procedure instructions were discussed and will be sent to Mychart and mailed.

## 2024-10-28 ENCOUNTER — Ambulatory Visit: Admission: RE | Admit: 2024-10-28 | Source: Home / Self Care | Admitting: Vascular Surgery

## 2024-10-28 ENCOUNTER — Encounter: Admission: RE | Payer: Self-pay | Source: Home / Self Care

## 2024-10-28 DIAGNOSIS — K551 Chronic vascular disorders of intestine: Secondary | ICD-10-CM

## 2024-11-14 ENCOUNTER — Encounter

## 2024-11-21 ENCOUNTER — Ambulatory Visit: Admitting: Cardiovascular Disease

## 2024-12-03 ENCOUNTER — Encounter (INDEPENDENT_AMBULATORY_CARE_PROVIDER_SITE_OTHER)

## 2024-12-03 ENCOUNTER — Ambulatory Visit (INDEPENDENT_AMBULATORY_CARE_PROVIDER_SITE_OTHER): Admitting: Vascular Surgery

## 2025-11-28 ENCOUNTER — Ambulatory Visit (INDEPENDENT_AMBULATORY_CARE_PROVIDER_SITE_OTHER): Admitting: Vascular Surgery

## 2025-11-28 ENCOUNTER — Encounter (INDEPENDENT_AMBULATORY_CARE_PROVIDER_SITE_OTHER)
